# Patient Record
Sex: Female | Born: 1960
Health system: Southern US, Community
[De-identification: ages and names within clinical notes are randomized; demographics above are authoritative.]

## PROBLEM LIST (undated history)

## (undated) DIAGNOSIS — C801 Malignant (primary) neoplasm, unspecified: Secondary | ICD-10-CM

## (undated) DIAGNOSIS — D649 Anemia, unspecified: Secondary | ICD-10-CM

## (undated) DIAGNOSIS — F419 Anxiety disorder, unspecified: Secondary | ICD-10-CM

## (undated) DIAGNOSIS — E785 Hyperlipidemia, unspecified: Secondary | ICD-10-CM

## (undated) DIAGNOSIS — C44509 Unspecified malignant neoplasm of skin of other part of trunk: Secondary | ICD-10-CM

## (undated) DIAGNOSIS — R030 Elevated blood-pressure reading, without diagnosis of hypertension: Secondary | ICD-10-CM

## (undated) DIAGNOSIS — N950 Postmenopausal bleeding: Secondary | ICD-10-CM

## (undated) DIAGNOSIS — M1990 Unspecified osteoarthritis, unspecified site: Secondary | ICD-10-CM

## (undated) DIAGNOSIS — C541 Malignant neoplasm of endometrium: Secondary | ICD-10-CM

## (undated) DIAGNOSIS — R7303 Prediabetes: Secondary | ICD-10-CM

## (undated) DIAGNOSIS — I10 Essential (primary) hypertension: Secondary | ICD-10-CM

## (undated) DIAGNOSIS — T7840XA Allergy, unspecified, initial encounter: Secondary | ICD-10-CM

## (undated) DIAGNOSIS — R079 Chest pain, unspecified: Secondary | ICD-10-CM

## (undated) DIAGNOSIS — F32A Depression, unspecified: Secondary | ICD-10-CM

## (undated) HISTORY — DX: Allergy, unspecified, initial encounter: T78.40XA

## (undated) HISTORY — DX: Hyperlipidemia, unspecified: E78.5

## (undated) HISTORY — DX: Malignant (primary) neoplasm, unspecified: C80.1

## (undated) HISTORY — PX: ABDOMINAL HYSTERECTOMY: SHX81

---

## 1998-01-20 HISTORY — PX: NECK SURGERY: SHX720

## 2017-04-06 DIAGNOSIS — H8110 Benign paroxysmal vertigo, unspecified ear: Secondary | ICD-10-CM | POA: Insufficient documentation

## 2018-03-21 DIAGNOSIS — Z90710 Acquired absence of both cervix and uterus: Secondary | ICD-10-CM | POA: Insufficient documentation

## 2018-03-24 ENCOUNTER — Encounter (HOSPITAL_COMMUNITY): Payer: Self-pay | Admitting: Emergency Medicine

## 2018-03-24 ENCOUNTER — Emergency Department (HOSPITAL_COMMUNITY): Payer: Self-pay

## 2018-03-24 ENCOUNTER — Emergency Department (HOSPITAL_COMMUNITY)
Admission: EM | Admit: 2018-03-24 | Discharge: 2018-03-24 | Disposition: A | Discharge status: Home / Self Care | Payer: Self-pay | Attending: Emergency Medicine | Admitting: Emergency Medicine

## 2018-03-24 ENCOUNTER — Other Ambulatory Visit: Payer: Self-pay

## 2018-03-24 DIAGNOSIS — N8500 Endometrial hyperplasia, unspecified: Secondary | ICD-10-CM | POA: Insufficient documentation

## 2018-03-24 DIAGNOSIS — I10 Essential (primary) hypertension: Secondary | ICD-10-CM | POA: Insufficient documentation

## 2018-03-24 DIAGNOSIS — N950 Postmenopausal bleeding: Secondary | ICD-10-CM | POA: Insufficient documentation

## 2018-03-24 DIAGNOSIS — R195 Other fecal abnormalities: Secondary | ICD-10-CM | POA: Insufficient documentation

## 2018-03-24 DIAGNOSIS — R9389 Abnormal findings on diagnostic imaging of other specified body structures: Secondary | ICD-10-CM

## 2018-03-24 LAB — CBC WITH DIFFERENTIAL/PLATELET
Abs Immature Granulocytes: 0.01 10*3/uL (ref 0.00–0.07)
Basophils Absolute: 0.1 10*3/uL (ref 0.0–0.1)
Basophils Relative: 1 %
Eosinophils Absolute: 0.4 10*3/uL (ref 0.0–0.5)
Eosinophils Relative: 5 %
HCT: 45.1 % (ref 36.0–46.0)
HEMOGLOBIN: 14.9 g/dL (ref 12.0–15.0)
Immature Granulocytes: 0 %
LYMPHS ABS: 2.9 10*3/uL (ref 0.7–4.0)
Lymphocytes Relative: 39 %
MCH: 29.2 pg (ref 26.0–34.0)
MCHC: 33 g/dL (ref 30.0–36.0)
MCV: 88.4 fL (ref 80.0–100.0)
Monocytes Absolute: 0.6 10*3/uL (ref 0.1–1.0)
Monocytes Relative: 8 %
Neutro Abs: 3.6 10*3/uL (ref 1.7–7.7)
Neutrophils Relative %: 47 %
Platelets: 334 10*3/uL (ref 150–400)
RBC: 5.1 MIL/uL (ref 3.87–5.11)
RDW: 13.5 % (ref 11.5–15.5)
WBC: 7.5 10*3/uL (ref 4.0–10.5)
nRBC: 0 % (ref 0.0–0.2)

## 2018-03-24 LAB — COMPREHENSIVE METABOLIC PANEL
ALT: 38 U/L (ref 0–44)
AST: 29 U/L (ref 15–41)
Albumin: 4.1 g/dL (ref 3.5–5.0)
Alkaline Phosphatase: 104 U/L (ref 38–126)
Anion gap: 10 (ref 5–15)
BUN: 17 mg/dL (ref 6–20)
CALCIUM: 9.5 mg/dL (ref 8.9–10.3)
CO2: 22 mmol/L (ref 22–32)
Chloride: 108 mmol/L (ref 98–111)
Creatinine, Ser: 0.73 mg/dL (ref 0.44–1.00)
GFR calc Af Amer: >60 mL/min (ref >60)
GFR calc non Af Amer: >60 mL/min (ref >60)
Glucose, Bld: 125 mg/dL — ABNORMAL HIGH (ref 70–99)
Potassium: 4.5 mmol/L (ref 3.5–5.1)
Sodium: 140 mmol/L (ref 135–145)
Total Bilirubin: 0.4 mg/dL (ref 0.3–1.2)
Total Protein: 7.4 g/dL (ref 6.5–8.1)

## 2018-03-24 LAB — URINALYSIS, ROUTINE W REFLEX MICROSCOPIC
Bacteria, UA: NONE SEEN
Bilirubin Urine: NEGATIVE
Glucose, UA: NEGATIVE mg/dL
Ketones, ur: NEGATIVE mg/dL
Leukocytes,Ua: NEGATIVE
Nitrite: NEGATIVE
PH: 6 (ref 5.0–8.0)
Protein, ur: NEGATIVE mg/dL
SPECIFIC GRAVITY, URINE: 1.003 — AB (ref 1.005–1.030)

## 2018-03-24 LAB — LIPASE, BLOOD: Lipase: 36 U/L (ref 11–51)

## 2018-03-24 LAB — WET PREP, GENITAL
Clue Cells Wet Prep HPF POC: NONE SEEN
Sperm: NONE SEEN
Trich, Wet Prep: NONE SEEN
WBC, Wet Prep HPF POC: NONE SEEN
Yeast Wet Prep HPF POC: NONE SEEN

## 2018-03-24 LAB — POC OCCULT BLOOD, ED: Fecal Occult Bld: POSITIVE — AB

## 2018-03-24 NOTE — Discharge Instructions (Addendum)
Please follow-up with the OB/GYN as directed on your discharge paper.  You are also given a referral to the gastroenterology doctor and a primary care doctor to follow-up about the bleeding from your rectum and your elevated blood pressure.  Please return to the emergency department immediately for any persistent heavy bleeding, chest pain, shortness of breath, lightheadedness/dizziness, severe abdominal pain or any new or worsening symptoms.

## 2018-03-24 NOTE — Progress Notes (Signed)
RNCM has spoken with [place] with [facility] to schedule follow up appointment. RNCM was advised that appointment for pt is on March 29, 2018 at 9:15am. RNCM has updated PA of this appointment at this time as well as advised PA that if pt is unable to keep this appointment then pt would need to reschedule for self.   Jessica [name] J. Clydene Laming, [place], [place], [place]

## 2018-03-24 NOTE — ED Notes (Signed)
Patient verbalizes understanding of discharge instructions. Opportunity for questioning and answers were provided. Armband removed by staff, pt discharged from ED.  

## 2018-03-24 NOTE — ED Triage Notes (Signed)
Pt arrives to ED from home with complaints of rectal and vaginal bleeding for the last two to three months. Pt stated that she has to change her pad three times daily for the last three months. Pt stated she has no seen a MD due to not having insurance. Pt stated that she feels like she has pressure on her lower abdomen.

## 2018-03-24 NOTE — ED Provider Notes (Signed)
[facility] EMERGENCY DEPARTMENT Provider Note   CSN: [id number] Arrival date & time: 03/24/18  0818    History   Chief Complaint Chief Complaint  Patient presents with  . Vaginal Bleeding  . Rectal Bleeding    HPI Jessica [name] is a 58 y.o. female.     HPI   Patient is a 58 year old female who presents the emergency department today for evaluation of vaginal bleeding and rectal bleeding.  Patient states she has not had a menstrual cycle in about 6 to 7 years.  States she started having vaginal spotting about 6 months ago.  Over the last 2 weeks and she is having increasing rectal and vaginal bleeding.  She notes having to change her pad 3 times daily.  Reports bright red blood and intermittent dark clots.  She reports that she has a heaviness to her lower abdomen.  She also has an intermittent tearing pain to the right upper quadrant that comes and goes.  States it feels like a burning and warm sensation.  She does not have this symptom currently.  She reports nausea, chronic frequency/urgency with urination.  She reports mild diarrhea.  She denies vomiting, dysuria, fevers, night sweats, unintended weight loss, lightheadedness, shortness of breath, dyspnea on exertion or chest pain.  She states that she has been told that she has high blood pressure in the past but is not currently on medication for this.   History reviewed. No pertinent past medical history.  There are no active problems to display for this patient.  OB History   No obstetric history on file.      Home Medications    Prior to Admission medications   Not on File    Family History History reviewed. No pertinent family history.  Social History Social History   Tobacco Use  . Smoking status: Not on file  Substance Use Topics  . Alcohol use: Not on file  . Drug use: Not on file     Allergies   Patient has no allergy information on record.   Review of Systems Review of  Systems  Constitutional: Negative for fever and unexpected weight change.  HENT: Negative for ear pain and sore throat.   Eyes: Negative for visual disturbance.  Respiratory: Negative for cough and shortness of breath.   Cardiovascular: Negative for chest pain and leg swelling.  Gastrointestinal: Positive for abdominal pain (pressure) and diarrhea. Negative for constipation, nausea and vomiting.  Genitourinary: Positive for frequency (chronic), urgency (chronic) and vaginal bleeding. Negative for dysuria, hematuria, vaginal discharge and vaginal pain.  Musculoskeletal: Negative for back pain.  Skin: Negative for rash.  Neurological: Negative for dizziness, syncope, weakness and light-headedness.  All other systems reviewed and are negative.    Physical Exam Updated Vital Signs BP 136/71   Pulse (!) 51   Temp 98 F (36.7 C) (Oral)   Resp 18   Ht 5\' 5"  (1.651 m)   Wt 102.1 kg   SpO2 94%   BMI 37.44 kg/m   Physical Exam Vitals signs and nursing note reviewed.  Constitutional:      General: She is not in acute distress.    Appearance: She is well-developed. She is not ill-appearing.  HENT:     Head: Normocephalic and atraumatic.     Mouth/Throat:     Mouth: Mucous membranes are moist.  Eyes:     Conjunctiva/sclera: Conjunctivae normal.  Neck:     Musculoskeletal: Neck supple.  Cardiovascular:  Rate and Rhythm: Normal rate and regular rhythm.     Heart sounds: Normal heart sounds. No murmur.  Pulmonary:     Effort: Pulmonary effort is normal. No respiratory distress.     Breath sounds: Normal breath sounds. No stridor. No wheezing.  Abdominal:     General: Bowel sounds are normal. There is no distension.     Palpations: Abdomen is soft.     Tenderness: There is no abdominal tenderness. There is no guarding or rebound.  Genitourinary:    Comments: Exam performed by Rodney Booze,  exam chaperoned Date: 03/24/2018 Pelvic exam: normal external genitalia without  evidence of trauma. VULVA: normal appearing vulva with no masses, tenderness or lesion. VAGINA: no discharge noted, mild amount of blood in the vaginal vault CERVIX: abnormal appearing cervix. Multiple irregularly shaped lobes of tissue that are friable.  Wet prep and DNA probe for chlamydia and GC obtained.   ADNEXA: right adnexal ttp UTERUS: uterus is normal size, shape, consistency and nontender.  DRE: without evidence of gross blood or melena.  Stool is light brown.  No tenderness with exam. Skin:    General: Skin is warm and dry.     Capillary Refill: Capillary refill takes less than 2 seconds.  Neurological:     Mental Status: She is alert.  Psychiatric:     Comments: Anxious, tearful    ED Treatments / Results  Labs (all labs ordered are listed, but only abnormal results are displayed) Labs Reviewed  COMPREHENSIVE METABOLIC PANEL - Abnormal; Notable for the following components:      Result Value   Glucose, Bld 125 (*)    All other components within normal limits  URINALYSIS, ROUTINE W REFLEX MICROSCOPIC - Abnormal; Notable for the following components:   Color, Urine COLORLESS (*)    Specific Gravity, Urine 1.003 (*)    Hgb urine dipstick MODERATE (*)    All other components within normal limits  POC OCCULT BLOOD, ED - Abnormal; Notable for the following components:   Fecal Occult Bld POSITIVE (*)    All other components within normal limits  WET PREP, GENITAL  CBC WITH DIFFERENTIAL/PLATELET  LIPASE, BLOOD  GC/CHLAMYDIA PROBE AMP ([place]) NOT AT [facility]    EKG None  Radiology US Pelvic Complete With Transvaginal  Result Date: 03/24/2018 CLINICAL DATA:  58 year old postmenopausal female with vaginal bleeding for 2 months. EXAM: TRANSABDOMINAL AND TRANSVAGINAL ULTRASOUND OF PELVIS TECHNIQUE: Both transabdominal and transvaginal ultrasound examinations of the pelvis were performed. Transabdominal technique was performed for global imaging of the pelvis including  uterus, ovaries, adnexal regions, and pelvic cul-de-sac. It was necessary to proceed with endovaginal exam following the transabdominal exam to visualize the ovaries and endometrium. COMPARISON:  None FINDINGS: Uterus Measurements: 7.1 x 3.3 x 4.9 cm = volume: 60 mL. No fibroids or other mass visualized. Endometrium Thickness: Heterogeneous measuring 14 mm. No focal abnormality visualized. Right ovary Not visualized Left ovary Not visualized Other findings A trace amount of free pelvic fluid noted. IMPRESSION: 1. Thickened endometrium in this postmenopausal female. In the setting of post-menopausal bleeding, endometrial sampling is indicated to exclude carcinoma. If results are benign, sonohysterogram should be considered for focal lesion work-up. (Ref: Radiological Reasoning: Algorithmic Workup of Abnormal Vaginal Bleeding with Endovaginal Sonography and Sonohysterography. AJR 2008; 833:A25-05) 2. Ovaries not visualized. Electronically Signed   By: Margarette Canada M.D.   On: 03/24/2018 11:28    Procedures Procedures (including critical care time)  Medications Ordered in ED Medications -  No data to display   Initial Impression / Assessment and Plan / ED Course  I have reviewed the triage vital signs and the nursing notes.  Pertinent labs & imaging results that were available during my care of the patient were reviewed by me and considered in my medical decision making (see chart for details).     Final Clinical Impressions(s) / ED Diagnoses   Final diagnoses:  Postmenopausal bleeding  Thickened endometrium  Hypertension, unspecified type  Heme positive stool   Patient presents for 22-month history of postmenopausal vaginal bleeding and rectal bleeding.  No fevers.  No lightheadedness or dizziness.  No chest pain or shortness of breath.  She is not tachycardic.  Normal respirations and O2 sats.  Afebrile.  She is significantly hypertensive on initial evaluation, she is very upset, anxious and  crying.  Blood pressure improved significantly on reevaluation (299 systolic) when she was more calm, however she does need to follow-up with PCP about her blood pressure.  Referral given.  No evidence of hypertensive emergency at this time.  Her abdominal exam is grossly benign.  Point-of-care Hemoccult was obtained and there was light brown stool noted without obvious gross blood or melena.  Pelvic exam reveals abnormal appearing cervix. Multiple irregularly shaped lobes of tissue that are friable with mild amount of blood noted in the vaginal vault.  Patient's laboratory work is reassuring, her CBC does not show any signs of anemia.  Normal white blood cell count.  CMP, lipase are normal.  UA shows a moderate amount of blood but no evidence of urinary tract infection.  Wet prep is normal.  Her Hemoccult is positive.  It is unclear whether this is secondary to microscopic blood from the vagina however will give follow-up with GI.  An ultrasound was obtained which showed a thickened endometrium and it was recommended to obtain endometrial sampling.  I have discussed the patient's case with case management who has assisted in obtaining the patient an appointment with OB/GYN on 03/29/2018 due to concern for underlying malignancy.  She is aware of the plan for follow-up.  I have discussed strict return precautions for any new or worsening symptoms including heavy bleeding.  She and her daughter at bedside voiced understanding of the plan and reasons to return.  All questions answered.  Patient stable discharge.  ED Discharge Orders    None       Bishop Dublin 03/24/18 1226    Sherwood Gambler, MD 04/05/18 [phone]

## 2018-03-25 LAB — GC/CHLAMYDIA PROBE AMP (~~LOC~~) NOT AT ARMC
Chlamydia: NEGATIVE
Neisseria Gonorrhea: NEGATIVE

## 2018-03-29 ENCOUNTER — Ambulatory Visit: Payer: Self-pay | Admitting: Clinical

## 2018-03-29 ENCOUNTER — Encounter: Payer: Self-pay | Admitting: Family Medicine

## 2018-03-29 ENCOUNTER — Ambulatory Visit (INDEPENDENT_AMBULATORY_CARE_PROVIDER_SITE_OTHER): Payer: Self-pay | Admitting: Family Medicine

## 2018-03-29 ENCOUNTER — Other Ambulatory Visit (HOSPITAL_COMMUNITY)
Admission: RE | Admit: 2018-03-29 | Discharge: 2018-03-29 | Disposition: A | Discharge status: Home / Self Care | Payer: Self-pay | Source: Ambulatory Visit | Attending: Family Medicine | Admitting: Family Medicine

## 2018-03-29 DIAGNOSIS — Z124 Encounter for screening for malignant neoplasm of cervix: Secondary | ICD-10-CM

## 2018-03-29 DIAGNOSIS — F430 Acute stress reaction: Secondary | ICD-10-CM

## 2018-03-29 DIAGNOSIS — Z1151 Encounter for screening for human papillomavirus (HPV): Secondary | ICD-10-CM

## 2018-03-29 DIAGNOSIS — N888 Other specified noninflammatory disorders of cervix uteri: Secondary | ICD-10-CM | POA: Insufficient documentation

## 2018-03-29 NOTE — Assessment & Plan Note (Signed)
Suspect cancer as diagnosis, shared with patient. Will call for appointment with GYN/ONC once path is back. Will call patient with results.

## 2018-03-29 NOTE — Progress Notes (Signed)
   Subjective:    Patient ID: Jessica [name] is a 58 y.o. female presenting with Vaginal Bleeding  on 03/29/2018  HPI: Has not seen a provider for multiple years. No pap since birth of youngest child 25 years ago. Possibly with h/o abnormal paps. Began spotting x 6 months. Worse x 2 months. Seen in ED with cervical mass. Went through menopause 7 years ago. Reports feeling heaviness or pressure in her lower abdomen. Works at [employer]. Moved here from [place] a couple of years ago. Has no health insurance.  Review of Systems  Constitutional: Negative for chills and fever.  Respiratory: Negative for shortness of breath.   Cardiovascular: Negative for chest pain.  Gastrointestinal: Negative for abdominal pain, nausea and vomiting.  Genitourinary: Negative for dysuria.  Skin: Negative for rash.      Objective:    BP (!) 186/99   Pulse 73   Wt 227 lb 6.4 oz (103.1 kg)   BMI 37.84 kg/m  Physical Exam Constitutional:      General: She is not in acute distress.    Appearance: She is well-developed.  HENT:     Head: Normocephalic and atraumatic.  Eyes:     General: No scleral icterus. Neck:     Musculoskeletal: Neck supple.  Cardiovascular:     Rate and Rhythm: Normal rate.  Pulmonary:     Effort: Pulmonary effort is normal.  Abdominal:     Palpations: Abdomen is soft.  Genitourinary:    Comments: Entire cervix replaced with polypoid mass with multiple projections. Skin:    General: Skin is warm and dry.  Neurological:     Mental Status: She is alert and oriented to person, place, and time.   Procedure: Cervix visualized and mass noted.  Mass biopsied x 4.  Hemostasis obtained with Monsel's solution.       Assessment & Plan:   Problem List Items Addressed This Visit      Unprioritized   Cervical mass    Suspect cancer as diagnosis, shared with patient. Will call for appointment with GYN/ONC once path is back. Will call patient with results.      Relevant  Orders   Cytology - PAP   Surgical pathology( [facility]/ [facility])      Total face-to-face time with patient: 20 minutes. Over 50% of encounter was spent on counseling and coordination of care. Return if symptoms worsen or fail to improve.  Jessica [name] 03/29/2018 10:04 AM

## 2018-03-29 NOTE — BH Specialist Note (Signed)
Integrated Behavioral Health Initial Visit  MRN: [id number] Name: Jessica [name]  Number of [facility] Clinician visits:: 1/6 Session Start time: 10:49  Session End time: 11:06 Total time: 15 minutes  Type of Service: Magnet Interpretor:No. Interpretor Name and Language: n/a   Warm Hand Off Completed.       SUBJECTIVE: Jessica [name] is a 58 y.o. female accompanied by Adult daughter Patient was referred by Darron Doom, MD for emotional support after cervical biopsy today Patient reports the following symptoms/concerns: Pt states her primary concern today is worry for her children, after cervical mass found today; pt's children lost their father to cancer seven years ago.  Duration of problem: Today; Severity of problem: moderate  OBJECTIVE: Mood: Appropriate and Affect: Appropriate and Tearful Risk of harm to self or others: No plan to harm self or others  LIFE CONTEXT: Family and Social: Pt has 3 children, and 1 granddaughter; family and friends supportive School/Work: Pt works full-time Self-Care: - Life Changes: Cervical mass found today  GOALS ADDRESSED: Patient will: 1. Reduce symptoms of: stress 2. Increase knowledge and/or ability of: coping skills  3. Demonstrate ability to: Increase healthy adjustment to current life circumstances  INTERVENTIONS: Interventions utilized: Supportive Counseling  Standardized Assessments completed: GAD-7 and PHQ 9  ASSESSMENT: Patient currently experiencing Acute stress reaction.   Patient may benefit from supportive counseling today.  PLAN: 1. Follow up with behavioral health clinician on : As needed 2. Behavioral recommendations:  -Continue to allow friends and family to be supports; continue routine walks with granddaughter 3. Referral(s): [place] (In Clinic) 4. "From scale of 1-10, how likely are you to follow plan?": -  Garlan Fair, LCSW  Depression screen [facility] 2/9 03/29/2018  Decreased Interest 1  Down, Depressed, Hopeless 2  PHQ - 2 Score 3  Altered sleeping 3  Tired, decreased energy 1  Change in appetite 3  Feeling bad or failure about yourself  3  Trouble concentrating 3  Moving slowly or fidgety/restless 2  Suicidal thoughts 0  PHQ-9 Score 18   GAD 7 : Generalized Anxiety Score 03/29/2018  Nervous, Anxious, on Edge 2  Control/stop worrying 1  Worry too much - different things 2  Trouble relaxing 1  Restless 0  Easily annoyed or irritable 1  Afraid - awful might happen 1  Total GAD 7 Score 8

## 2018-03-29 NOTE — Patient Instructions (Signed)
Cervical Biopsy  A cervical biopsy is a procedure in which a sample of tissue from the cervix is removed and examined under a microscope to see if cancer cells are present. The cervix is the lowest part of the womb (uterus), which opens into the vagina (birth canal). You may also have this procedure:  · To check for growths that may become cancer.  · If the results of your Pap test were abnormal.  · If your health care provider saw an abnormality during a pelvic exam.  Tell a health care provider about:  · Any allergies you have.  · All medicines you are taking, including vitamins, herbs, eye drops, creams, and over-the-counter medicines.  · Any problems you or family members have had with anesthetic medicines.  · Any blood disorders you have.  · Any surgeries you have had.  · Any medical conditions you have.  · Whether you are pregnant or may be pregnant.  · Whether you are having your menstrual period or will be having your period at the time of the procedure.  What are the risks?  Generally, this is a safe procedure. However, problems may occur, including:  · Infection.  · Bleeding.  · Allergic reactions to medicines or dyes.  · Damage to other structures or organs.  What happens before the procedure?  Medicines  · You may be given an over-the-counter pain medicine to take right before the procedure.  · Ask your health care provider about:  ? Changing or stopping your regular medicines. This is especially important if you are taking diabetes medicines or blood thinners.  ? Taking medicines such as aspirin and ibuprofen. These medicines can thin your blood. Do not take these medicines unless your health care provider tells you to take them.  ? Taking over-the-counter medicines, vitamins, herbs, and supplements.  General instructions  · Do not douche, have sex, use tampons, or put any medicines in your vagina as told by your health care provider.  · Follow instructions from your health care provider about eating or  drinking restrictions.  · You may be asked to empty your bladder and bowel right before the procedure.  · Ask your health care provider what steps will be taken to help prevent infection. These may include:  ? Removing hair at the surgery site.  ? Washing skin with a germ-killing soap.  What happens during the procedure?  · You will undress from the waist down.  · You will lie on an examining table and put your feet in stirrups.  · Your health care provider will use a lubricated instrument (speculum) to open your vagina. An instrument that has a magnifying lens and a light (colposcope) will let your health care provider examine the cervix more closely.  · You may be given a medicine to numb the area (local anesthetic).  · Your health care provider will apply a solution to your cervix. This turns abnormal areas a pale color.  · Your health care provider will use an instrument (biopsy forceps) to take one or more small pieces of tissue that appear abnormal.  · If there seems to be an abnormal area in the part of your cervix that leads to the uterus (endocervical canal), your health care provider will use an instrument (curette) to scrape tissue from that area. This is called endocervical curettage.  · Your health care provider will apply a paste over the biopsy areas to help control bleeding.  The procedure may vary among   health care providers and hospitals.  What happens after the procedure?  · You may have slight bleeding and cramping after the procedure. You will need to wear a sanitary napkin for bleeding.  · It is up to you to get the results of your procedure. Ask your health care provider, or the department that is doing the procedure, when your results will be ready.  Summary  · A cervical biopsy is a procedure in which a sample of tissue from the cervix is removed and examined under a microscope to see if cancer cells are present.  · Generally, this is a safe procedure. However, infection, bleeding, reaction  to medicines, or damage to other organs may occur.  · Ask your health care provider whether you should stop your regular medicines.  · You may be given an over-the-counter pain medicine to take right before the procedure.  · Ask your health care provider, or the department that is doing the procedure, when your results will be ready.  This information is not intended to replace advice given to you by your health care provider. Make sure you discuss any questions you have with your health care provider.  Document Released: 01/06/2005 Document Revised: 06/12/2017 Document Reviewed: 06/12/2017  Elsevier Interactive Patient Education © 2019 [employer] Inc[employer]

## 2018-03-30 ENCOUNTER — Telehealth: Payer: Self-pay | Admitting: *Deleted

## 2018-03-30 ENCOUNTER — Telehealth: Payer: Self-pay | Admitting: Family Medicine

## 2018-03-30 NOTE — Telephone Encounter (Signed)
Called and left the patient a message to call the office back. Need to move her appt from tomorrow to Friday.

## 2018-03-30 NOTE — Telephone Encounter (Signed)
Patient called back and was given the appt information for tomorrow. (date, time, address, valet, pelvic exam)

## 2018-03-30 NOTE — Telephone Encounter (Signed)
Discussed pathology--cannot tell exact primary--mixed type of cells. She was having difficulty hearing as she was driving. Knows about appointment tomorrow. She will keep appointment.

## 2018-03-31 ENCOUNTER — Other Ambulatory Visit: Payer: Self-pay

## 2018-03-31 ENCOUNTER — Encounter: Payer: Self-pay | Admitting: Gynecologic Oncology

## 2018-03-31 ENCOUNTER — Inpatient Hospital Stay: Payer: Self-pay | Attending: Gynecologic Oncology | Admitting: Gynecologic Oncology

## 2018-03-31 ENCOUNTER — Other Ambulatory Visit: Payer: Self-pay | Admitting: Gynecologic Oncology

## 2018-03-31 VITALS — BP 161/74 | HR 74 | Temp 98.0°F | Resp 18 | Ht 65.0 in | Wt 228.0 lb

## 2018-03-31 DIAGNOSIS — C541 Malignant neoplasm of endometrium: Secondary | ICD-10-CM | POA: Insufficient documentation

## 2018-03-31 LAB — CYTOLOGY - PAP: HPV: NOT DETECTED

## 2018-03-31 MED ORDER — SENNOSIDES-DOCUSATE SODIUM 8.6-50 MG PO TABS: 2.0000 | ORAL_TABLET | Freq: Every day | ORAL | 1 refills | Status: DC

## 2018-03-31 MED ORDER — OXYCODONE HCL 5 MG PO TABS: 5.0000 mg | ORAL_TABLET | ORAL | 0 refills | Status: DC | PRN

## 2018-03-31 MED ORDER — IBUPROFEN 600 MG PO TABS: 600.0000 mg | ORAL_TABLET | Freq: Four times a day (QID) | ORAL | 1 refills | Status: DC | PRN

## 2018-03-31 NOTE — H&P (View-Only) (Signed)
Consult Note: Gyn-Onc  Consult was requested by Dr. Kennon Rounds for the evaluation of Jessica [name] 58 y.o. female  CC:  Chief Complaint  Patient presents with  . endometrial cancer    Assessment/Plan:  [name]  is a 58 y.o.  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   On examination it appears that the tumor is prolapsing through the cervix however I can palpate a thin rim of grossly normal cervix surrounding this mass, therefore there is a good possibility that this is not a stage II tumor.  However the displacement of the cervix with the mass precludes the ability to perform sentinel lymph node biopsy.  We will obtain preoperative CT scans to evaluate for obvious metastatic disease that would change plans for proceeding with surgery.  If these remain reassuring we will proceed with a robotically assisted total hysterectomy BSO and pelvic possibly para-aortic lymphadenectomy.  A detailed discussion was held with the patient and her family with regard to to her endometrial cancer diagnosis. The patient has been counseled about these surgical options and the risks of surgery in general including infection, bleeding, damage to surrounding structures (including bowel, bladder, ureters, nerves or vessels), and the postoperative risks of PE/ DVT, and lymphedema. I extensively reviewed the additional risks of robotic hysterectomy including possible need for conversion to open laparotomy.  I discussed positioning during surgery of trendelenberg and risks of minor facial swelling and care we take in preoperative positioning.   She will be seen for a discussion of postoperative pain management.  She was given the opportunity to ask questions, which were answered to her satisfaction, and she is agreement with the above mentioned plan of care.  She was informed that due to the high grade nature of her pathology, she will likely require postoperative adjuvant therapy with chemotherapy and possibly  radiation.   HPI: [name] is a 58 year old P3 who is seen in consultation at the request of Dr Kennon Rounds for uterine carcinosarcoma.  The patient reports 52-month history of vaginal spotting which became more consistent with menstrual-like bleeding in January 2020.  She then presented to the emergency room at [facility] where a physical examination was performed which revealed a cervical mass, on March 24, 2018.  She then had a transvaginal ultrasound scan on March 24, 2018 which revealed a uterus measuring 7.1 x 3.3 x 4.9 cm with no gross masses within the uterus identified, the endometrium was thickened measuring 14 mm but with no focal abnormality, the right and left ovaries were not visualized, there was a trace amount of free pelvic fluid.  Due to the finding of a thickened endometrium and the cervical mass on examination she was sent for evaluation by Dr. Kennon Rounds.  On examination on March 29, 2026 Dr. Kennon Rounds identified that the entire cervix was replaced with a polypoid mass with multiple projections.  She took biopsies from this mass which revealed carcinosarcoma.  The patient does not seek medical care frequently and does not carry medical diagnoses.  She is obese with a BMI of 38 kg/m.  Her father died from lung cancer but was a smoker.  She also has a maternal aunt who died from metastatic carcinoma but she is unknown of what primary diagnosis she had.  The patient has had no prior abdominal surgeries only prior neck surgery.  She had 3 pregnancies which were all vaginal deliveries.  Recent Hemoccult testing of her stool was positive and she has  a strong urology consult scheduled for April 10, 2018.  She is never had a colonoscopy.  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   No facility-administered encounter medications on file as of 03/31/2018.     Allergy: Not on File  Social  Hx:   Social History   Socioeconomic History  . Marital status: Widowed    Spouse name: Not on file  . Number of children: Not on file  . Years of education: Not on file  . Highest education level: Not on file  Occupational History  . Not on file  Social Needs  . Financial resource strain: Not on file  . Food insecurity:    Worry: Never true    Inability: Never true  . Transportation needs:    Medical: No    Non-medical: No  Tobacco Use  . Smoking status: Never Smoker  . Smokeless tobacco: Never Used  Substance and Sexual Activity  . Alcohol use: Yes    Alcohol/week: 4.0 standard drinks    Types: 4 Glasses of wine per week  . Drug use: Never  . Sexual activity: Not Currently  Lifestyle  . Physical activity:    Days per week: Not on file    Minutes per session: Not on file  . Stress: Not on file  Relationships  . Social connections:    Talks on phone: Not on file    Gets together: Not on file    Attends religious service: Not on file    Active member of club or organization: Not on file    Attends meetings of clubs or organizations: Not on file    Relationship status: Not on file  . Intimate partner violence:    Fear of current or ex partner: Not on file    Emotionally abused: Not on file    Physically abused: Not on file    Forced sexual activity: Not on file  Other Topics Concern  . Not on file  Social History Narrative  . Not on file    Past Surgical Hx:  Past Surgical History:  Procedure Laterality Date  . NECK SURGERY  2000    Past Medical Hx: History reviewed. No pertinent past medical history.  Past Gynecological History:  P3 (svd's) No LMP recorded. Patient is postmenopausal.  Family Hx:  Family History  Problem Relation Age of Onset  . Hypertension Father   . Heart disease Father   . Cancer Father        lung  . Hypertension Mother     Review of Systems:  Constitutional  Feels well,   ENT Normal appearing ears and nares  bilaterally Skin/Breast  No rash, sores, jaundice, itching, dryness Cardiovascular  No chest pain, shortness of breath, or edema  Pulmonary  No cough or wheeze.  Gastro Intestinal  No nausea, vomitting, or diarrhoea. No bright red blood per rectum, no abdominal pain, change in bowel movement, or constipation.  Genito Urinary  No frequency, urgency, dysuria, + postmenopausal bleeding Musculo Skeletal  No myalgia, arthralgia, joint swelling or pain  Neurologic  No weakness, numbness, change in gait,  Psychology  No depression, anxiety, insomnia.   Vitals:  Blood pressure (!) 161/74, pulse 74, temperature 98 F (36.7 C), temperature source Oral, resp. rate 18, height 5\' 5"  (1.651 m), weight 228 lb (103.4 kg), SpO2 99 %.  Physical Exam: WD in NAD Neck  Supple  NROM, without any enlargements.  Lymph Node Survey No cervical supraclavicular or inguinal adenopathy Cardiovascular  Pulse normal rate, regularity and rhythm. S1 and S2 normal.  Lungs  Clear to auscultation bilateraly, without wheezes/crackles/rhonchi. Good air movement.  Skin  No rash/lesions/breakdown  Psychiatry  Alert and oriented to person, place, and time  Abdomen  Normoactive bowel sounds, abdomen soft, non-tender and obese without evidence of hernia.  Back No CVA tenderness Genito Urinary  Vulva/vagina: Normal external female genitalia.  No lesions. No discharge or bleeding.  Bladder/urethra:  No lesions or masses, well supported bladder  Vagina: filled with tumor from uterus, but the vaginal walls are grossly normal and palpably normal  Cervix: dilated around a central prolapsing mass through the cervical os. Palpably normal rim of cervix appreciated circumferentially around the prolapsed mass  Uterus: Small, mobile, no parametrial involvement or nodularity.  Adnexa: no discretely palpated masses. Rectal  deferred Extremities  No bilateral cyanosis, clubbing or edema.   Thereasa Solo, MD  03/31/2018, 9:21  AM

## 2018-03-31 NOTE — Progress Notes (Signed)
Consult Note: Gyn-Onc  Consult was requested by Dr. Kennon Rounds for the evaluation of Jessica [name] 58 y.o. female  CC:  Chief Complaint  Patient presents with  . endometrial cancer    Assessment/Plan:  [name]  is a 58 y.o.  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   On examination it appears that the tumor is prolapsing through the cervix however I can palpate a thin rim of grossly normal cervix surrounding this mass, therefore there is a good possibility that this is not a stage II tumor.  However the displacement of the cervix with the mass precludes the ability to perform sentinel lymph node biopsy.  We will obtain preoperative CT scans to evaluate for obvious metastatic disease that would change plans for proceeding with surgery.  If these remain reassuring we will proceed with a robotically assisted total hysterectomy BSO and pelvic possibly para-aortic lymphadenectomy.  A detailed discussion was held with the patient and her family with regard to to her endometrial cancer diagnosis. The patient has been counseled about these surgical options and the risks of surgery in general including infection, bleeding, damage to surrounding structures (including bowel, bladder, ureters, nerves or vessels), and the postoperative risks of PE/ DVT, and lymphedema. I extensively reviewed the additional risks of robotic hysterectomy including possible need for conversion to open laparotomy.  I discussed positioning during surgery of trendelenberg and risks of minor facial swelling and care we take in preoperative positioning.   She will be seen for a discussion of postoperative pain management.  She was given the opportunity to ask questions, which were answered to her satisfaction, and she is agreement with the above mentioned plan of care.  She was informed that due to the high grade nature of her pathology, she will likely require postoperative adjuvant therapy with chemotherapy and possibly  radiation.   HPI: [name] is a 58 year old P3 who is seen in consultation at the request of Dr Kennon Rounds for uterine carcinosarcoma.  The patient reports 86-month history of vaginal spotting which became more consistent with menstrual-like bleeding in January 2020.  She then presented to the emergency room at [facility] where a physical examination was performed which revealed a cervical mass, on March 24, 2018.  She then had a transvaginal ultrasound scan on March 24, 2018 which revealed a uterus measuring 7.1 x 3.3 x 4.9 cm with no gross masses within the uterus identified, the endometrium was thickened measuring 14 mm but with no focal abnormality, the right and left ovaries were not visualized, there was a trace amount of free pelvic fluid.  Due to the finding of a thickened endometrium and the cervical mass on examination she was sent for evaluation by Dr. Kennon Rounds.  On examination on March 29, 2026 Dr. Kennon Rounds identified that the entire cervix was replaced with a polypoid mass with multiple projections.  She took biopsies from this mass which revealed carcinosarcoma.  The patient does not seek medical care frequently and does not carry medical diagnoses.  She is obese with a BMI of 38 kg/m.  Her father died from lung cancer but was a smoker.  She also has a maternal aunt who died from metastatic carcinoma but she is unknown of what primary diagnosis she had.  The patient has had no prior abdominal surgeries only prior neck surgery.  She had 3 pregnancies which were all vaginal deliveries.  Recent Hemoccult testing of her stool was positive and she has  a strong urology consult scheduled for April 10, 2018.  She is never had a colonoscopy.  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   No facility-administered encounter medications on file as of 03/31/2018.     Allergy: Not on File  Social  Hx:   Social History   Socioeconomic History  . Marital status: Widowed    Spouse name: Not on file  . Number of children: Not on file  . Years of education: Not on file  . Highest education level: Not on file  Occupational History  . Not on file  Social Needs  . Financial resource strain: Not on file  . Food insecurity:    Worry: Never true    Inability: Never true  . Transportation needs:    Medical: No    Non-medical: No  Tobacco Use  . Smoking status: Never Smoker  . Smokeless tobacco: Never Used  Substance and Sexual Activity  . Alcohol use: Yes    Alcohol/week: 4.0 standard drinks    Types: 4 Glasses of wine per week  . Drug use: Never  . Sexual activity: Not Currently  Lifestyle  . Physical activity:    Days per week: Not on file    Minutes per session: Not on file  . Stress: Not on file  Relationships  . Social connections:    Talks on phone: Not on file    Gets together: Not on file    Attends religious service: Not on file    Active member of club or organization: Not on file    Attends meetings of clubs or organizations: Not on file    Relationship status: Not on file  . Intimate partner violence:    Fear of current or ex partner: Not on file    Emotionally abused: Not on file    Physically abused: Not on file    Forced sexual activity: Not on file  Other Topics Concern  . Not on file  Social History Narrative  . Not on file    Past Surgical Hx:  Past Surgical History:  Procedure Laterality Date  . NECK SURGERY  2000    Past Medical Hx: History reviewed. No pertinent past medical history.  Past Gynecological History:  P3 (svd's) No LMP recorded. Patient is postmenopausal.  Family Hx:  Family History  Problem Relation Age of Onset  . Hypertension Father   . Heart disease Father   . Cancer Father        lung  . Hypertension Mother     Review of Systems:  Constitutional  Feels well,   ENT Normal appearing ears and nares  bilaterally Skin/Breast  No rash, sores, jaundice, itching, dryness Cardiovascular  No chest pain, shortness of breath, or edema  Pulmonary  No cough or wheeze.  Gastro Intestinal  No nausea, vomitting, or diarrhoea. No bright red blood per rectum, no abdominal pain, change in bowel movement, or constipation.  Genito Urinary  No frequency, urgency, dysuria, + postmenopausal bleeding Musculo Skeletal  No myalgia, arthralgia, joint swelling or pain  Neurologic  No weakness, numbness, change in gait,  Psychology  No depression, anxiety, insomnia.   Vitals:  Blood pressure (!) 161/74, pulse 74, temperature 98 F (36.7 C), temperature source Oral, resp. rate 18, height 5\' 5"  (1.651 m), weight 228 lb (103.4 kg), SpO2 99 %.  Physical Exam: WD in NAD Neck  Supple  NROM, without any enlargements.  Lymph Node Survey No cervical supraclavicular or inguinal adenopathy Cardiovascular  Pulse normal rate, regularity and rhythm. S1 and S2 normal.  Lungs  Clear to auscultation bilateraly, without wheezes/crackles/rhonchi. Good air movement.  Skin  No rash/lesions/breakdown  Psychiatry  Alert and oriented to person, place, and time  Abdomen  Normoactive bowel sounds, abdomen soft, non-tender and obese without evidence of hernia.  Back No CVA tenderness Genito Urinary  Vulva/vagina: Normal external female genitalia.  No lesions. No discharge or bleeding.  Bladder/urethra:  No lesions or masses, well supported bladder  Vagina: filled with tumor from uterus, but the vaginal walls are grossly normal and palpably normal  Cervix: dilated around a central prolapsing mass through the cervical os. Palpably normal rim of cervix appreciated circumferentially around the prolapsed mass  Uterus: Small, mobile, no parametrial involvement or nodularity.  Adnexa: no discretely palpated masses. Rectal  deferred Extremities  No bilateral cyanosis, clubbing or edema.   Thereasa Solo, MD  03/31/2018, 9:21  AM

## 2018-03-31 NOTE — Patient Instructions (Addendum)
Plan to have a CT scan of the chest, abdomen, and pelvis and we will contact you with the results.  Preparing for your Surgery  Plan for surgery on April 13, 2018 with Dr. Everitt Amber at [facility] will be scheduled for a robotic assisted total hysterectomy, bilateral salpingo-oophorectomy, lymphadenectomy.   Pre-operative Testing -You will receive a phone call from presurgical testing at [facility] if you have not received a call already to arrange for a pre-operative testing appointment before your surgery.  This appointment normally occurs one to two weeks before your scheduled surgery.   -Bring your insurance card, copy of an advanced directive if applicable, medication list  -At that visit, you will be asked to sign a consent for a possible blood transfusion in case a transfusion becomes necessary during surgery.  The need for a blood transfusion is rare but having consent is a necessary part of your care.     -You should not be taking blood thinners or aspirin at least ten days prior to surgery unless instructed by your surgeon.  -As part of our enhanced surgical recovery pathway, you may be advised to drink a carbohydrate drink the morning of surgery (at least 3 hours before). If you are diabetic, this will be avoided in order to prevent elevated glucose levels.  Day Before Surgery at [place] will be asked to take in a light diet the day before surgery.  Avoid carbonated beverages.  You will be advised to have nothing to eat or drink after midnight the evening before.    Eat a light diet the day before surgery.  Examples including soups, broths, toast, yogurt, mashed potatoes.  Things to avoid include carbonated beverages (fizzy beverages), raw fruits and raw vegetables, or beans.   If your bowels are filled with gas, your surgeon will have difficulty visualizing your pelvic organs which increases your surgical risks.  Your role in recovery Your role is to  become active as soon as directed by your doctor, while still giving yourself time to heal.  Rest when you feel tired. You will be asked to do the following in order to speed your recovery:  - Cough and breathe deeply. This helps toclear and expand your lungs and can prevent pneumonia. You may be given a spirometer to practice deep breathing. A staff member will show you how to use the spirometer. - Do mild physical activity. Walking or moving your legs help your circulation and body functions return to normal. A staff member will help you when you try to walk and will provide you with simple exercises. Do not try to get up or walk alone the first time. - Actively manage your pain. Managing your pain lets you move in comfort. We will ask you to rate your pain on a scale of zero to 10. It is your responsibility to tell your doctor or nurse where and how much you hurt so your pain can be treated.  Special Considerations -If you are diabetic, you may be placed on insulin after surgery to have closer control over your blood sugars to promote healing and recovery.  This does not mean that you will be discharged on insulin.  If applicable, your oral antidiabetics will be resumed when you are tolerating a solid diet.  -Your final pathology results from surgery should be available around one week after surgery and the results will be relayed to you when available.  -Dr. Lahoma Crocker is the Surgeon that  assists your GYN Oncologist with surgery.  If you end up staying the night, the next day after your surgery you will either see Dr. Denman George or Dr. Lahoma Crocker.  -FMLA forms can be faxed to [phone] and please allow 5-7 business days for completion.  Pain Management After Surgery -You have been prescribed your pain medication and bowel regimen medications before surgery so that you can have these available when you are discharged from the hospital. The pain medication is for use ONLY AFTER  surgery and a new prescription will not be given.   -Make sure that you have Tylenol and Ibuprofen at home to use on a regular basis after surgery for pain control. We recommend alternating the medications every hour to six hours since they work differently and are processed in the body differently for pain relief.  -Review the attached handout on narcotic use and their risks and side effects.   Bowel Regimen -You have been prescribed Sennakot-S to take nightly to prevent constipation especially if you are taking the narcotic pain medication intermittently.  It is important to prevent constipation and drink adequate amounts of liquids.  Blood Transfusion Information WHAT IS A BLOOD TRANSFUSION? A transfusion is the replacement of blood or some of its parts. Blood is made up of multiple cells which provide different functions.  Red blood cells carry oxygen and are used for blood loss replacement.  White blood cells fight against infection.  Platelets control bleeding.  Plasma helps clot blood.  Other blood products are available for specialized needs, such as hemophilia or other clotting disorders. BEFORE THE TRANSFUSION  Who gives blood for transfusions?   You may be able to donate blood to be used at a later date on yourself (autologous donation).  Relatives can be asked to donate blood. This is generally not any safer than if you have received blood from a stranger. The same precautions are taken to ensure safety when a relative's blood is donated.  Healthy volunteers who are fully evaluated to make sure their blood is safe. This is blood bank blood. Transfusion therapy is the safest it has ever been in the practice of medicine. Before blood is taken from a donor, a complete history is taken to make sure that person has no history of diseases nor engages in risky social behavior (examples are intravenous drug use or sexual activity with multiple partners). The donor's travel history  is screened to minimize risk of transmitting infections, such as malaria. The donated blood is tested for signs of infectious diseases, such as HIV and hepatitis. The blood is then tested to be sure it is compatible with you in order to minimize the chance of a transfusion reaction. If you or a relative donates blood, this is often done in anticipation of surgery and is not appropriate for emergency situations. It takes many days to process the donated blood. RISKS AND COMPLICATIONS Although transfusion therapy is very safe and saves many lives, the main dangers of transfusion include:   Getting an infectious disease.  Developing a transfusion reaction. This is an allergic reaction to something in the blood you were given. Every precaution is taken to prevent this. The decision to have a blood transfusion has been considered carefully by your caregiver before blood is given. Blood is not given unless the benefits outweigh the risks.

## 2018-04-02 ENCOUNTER — Other Ambulatory Visit: Payer: Self-pay

## 2018-04-02 ENCOUNTER — Ambulatory Visit (HOSPITAL_COMMUNITY)
Admission: RE | Admit: 2018-04-02 | Discharge: 2018-04-02 | Disposition: A | Discharge status: Home / Self Care | Payer: Self-pay | Source: Ambulatory Visit | Attending: Gynecologic Oncology | Admitting: Gynecologic Oncology

## 2018-04-02 DIAGNOSIS — C541 Malignant neoplasm of endometrium: Secondary | ICD-10-CM | POA: Insufficient documentation

## 2018-04-02 MED ORDER — IOHEXOL 300 MG/ML  SOLN
100.0000 mL | Freq: Once | INTRAMUSCULAR | Status: AC | PRN
  Administered 2018-04-02: 100 mL via INTRAVENOUS

## 2018-04-02 MED ORDER — SODIUM CHLORIDE (PF) 0.9 % IJ SOLN
INTRAMUSCULAR | Status: AC
  Filled 2018-04-02: qty 50

## 2018-04-02 NOTE — Patient Instructions (Signed)
Jessica [name]  04/02/2018   Your procedure is scheduled on: 04-13-2018   Report to [facility]  Entrance    Report to admitting at 8:00AM    Call this number if you have problems the morning of surgery [phone]      Remember: NO SOLID FOOD AFTER MIDNIGHT THE NIGHT PRIOR TO SURGERY. YOU MAY DRINK CLEAR LIQUIDS.   STOP CLEAR LIQUIDS AT ___7:00AM______ AND THEN DRINK THE ENSURE PRE-SURGERY [place]. NOTHING BY MOUTH AFTER THE ENSURE DRINK! BRUSH YOUR TEETH MORNING OF SURGERY AND RINSE YOUR MOUTH OUT, NO CHEWING GUM CANDY OR MINTS.      CLEAR LIQUID DIET   Foods Allowed                                                                     Foods Excluded  Coffee and tea, regular and decaf                             liquids that you cannot  Plain Jell-O in any flavor                                             see through such as: Fruit ices (not with fruit pulp)                                     milk, soups, orange juice  Iced Popsicles                                    All solid food Carbonated beverages, regular and diet                                    Cranberry, grape and apple juices Sports drinks like Gatorade Lightly seasoned clear broth or consume(fat free) Sugar, honey syrup  Sample Menu Breakfast                                Lunch                                     Supper Cranberry juice                    Beef broth                            Chicken broth Jell-O  Grape juice                           Apple juice Coffee or tea                        Jell-O                                      Popsicle                                                Coffee or tea                        Coffee or tea  _____________________________________________________________________       Take these medicines the morning of surgery with A SIP OF WATER: none                                You may not have  any metal on your body including hair pins and              piercings  Do not wear jewelry, make-up, lotions, powders or perfumes, deodorant             Do not wear nail polish.  Do not shave  48 hours prior to surgery.     Do not bring valuables to the hospital. [place].  Contacts, dentures or bridgework may not be worn into surgery.       Patients discharged the day of surgery will not be allowed to drive home. IF YOU ARE HAVING SURGERY AND GOING HOME THE SAME DAY, YOU MUST HAVE AN ADULT TO DRIVE YOU HOME AND BE WITH YOU FOR 24 HOURS. YOU MAY GO HOME BY TAXI OR UBER OR ORTHERWISE, BUT AN ADULT MUST ACCOMPANY YOU HOME AND STAY WITH YOU FOR 24 HOURS.  Name and phone number of your driver:  Special Instructions: N/A              Please read over the following fact sheets you were given: _____________________________________________________________________             [facility] - Preparing for Surgery Before surgery, you can play an important role.  Because skin is not sterile, your skin needs to be as free of germs as possible.  You can reduce the number of germs on your skin by washing with CHG (chlorahexidine gluconate) soap before surgery.  CHG is an antiseptic cleaner which kills germs and bonds with the skin to continue killing germs even after washing. Please DO NOT use if you have an allergy to CHG or antibacterial soaps.  If your skin becomes reddened/irritated stop using the CHG and inform your nurse when you arrive at Short Stay. Do not shave (including legs and underarms) for at least 48 hours prior to the first CHG shower.  You may shave your face/neck. Please follow these instructions carefully:  1.  Shower with CHG Soap the night before surgery and the  morning  of Surgery.  2.  If you choose to wash your hair, wash your hair first as usual with your  normal  shampoo.  3.  After you shampoo, rinse your hair and body  thoroughly to remove the  shampoo.                           4.  Use CHG as you would any other liquid soap.  You can apply chg directly  to the skin and wash                       Gently with a scrungie or clean washcloth.  5.  Apply the CHG Soap to your body ONLY FROM THE NECK DOWN.   Do not use on face/ open                           Wound or open sores. Avoid contact with eyes, ears mouth and genitals (private parts).                       Wash face,  Genitals (private parts) with your normal soap.             6.  Wash thoroughly, paying special attention to the area where your surgery  will be performed.  7.  Thoroughly rinse your body with warm water from the neck down.  8.  DO NOT shower/wash with your normal soap after using and rinsing off  the CHG Soap.                9.  Pat yourself dry with a clean towel.            10.  Wear clean pajamas.            11.  Place clean sheets on your bed the night of your first shower and do not  sleep with pets. Day of Surgery : Do not apply any lotions/deodorants the morning of surgery.  Please wear clean clothes to the hospital/surgery center.  FAILURE TO FOLLOW THESE INSTRUCTIONS MAY RESULT IN THE CANCELLATION OF YOUR SURGERY PATIENT SIGNATURE_________________________________  NURSE SIGNATURE__________________________________  ________________________________________________________________________   Jessica [name]  An incentive spirometer is a tool that can help keep your lungs clear and active. This tool measures how well you are filling your lungs with each breath. Taking long deep breaths may help reverse or decrease the chance of developing breathing (pulmonary) problems (especially infection) following:  A long period of time when you are unable to move or be active. BEFORE THE PROCEDURE   If the spirometer includes an indicator to show your best effort, your nurse or respiratory therapist will set it to a desired goal.  If  possible, sit up straight or lean slightly forward. Try not to slouch.  Hold the incentive spirometer in an upright position. INSTRUCTIONS FOR USE  1. Sit on the edge of your bed if possible, or sit up as far as you can in bed or on a chair. 2. Hold the incentive spirometer in an upright position. 3. Breathe out normally. 4. Place the mouthpiece in your mouth and seal your lips tightly around it. 5. Breathe in slowly and as deeply as possible, raising the piston or the ball toward the top of the column. 6. Hold your breath for 3-5 seconds or for as long as possible.  Allow the piston or ball to fall to the bottom of the column. 7. Remove the mouthpiece from your mouth and breathe out normally. 8. Rest for a few seconds and repeat Steps 1 through 7 at least 10 times every 1-2 hours when you are awake. Take your time and take a few normal breaths between deep breaths. 9. The spirometer may include an indicator to show your best effort. Use the indicator as a goal to work toward during each repetition. 10. After each set of 10 deep breaths, practice coughing to be sure your lungs are clear. If you have an incision (the cut made at the time of surgery), support your incision when coughing by placing a pillow or rolled up towels firmly against it. Once you are able to get out of bed, walk around indoors and cough well. You may stop using the incentive spirometer when instructed by your caregiver.  RISKS AND COMPLICATIONS  Take your time so you do not get dizzy or light-headed.  If you are in pain, you may need to take or ask for pain medication before doing incentive spirometry. It is harder to take a deep breath if you are having pain. AFTER USE  Rest and breathe slowly and easily.  It can be helpful to keep track of a log of your progress. Your caregiver can provide you with a simple table to help with this. If you are using the spirometer at home, follow these instructions: [place]  IF:   You are having difficultly using the spirometer.  You have trouble using the spirometer as often as instructed.  Your pain medication is not giving enough relief while using the spirometer.  You develop fever of 100.5 F (38.1 C) or higher. SEEK IMMEDIATE MEDICAL CARE IF:   You cough up bloody sputum that had not been present before.  You develop fever of 102 F (38.9 C) or greater.  You develop worsening pain at or near the incision site. MAKE SURE YOU:   Understand these instructions.  Will watch your condition.  Will get help right away if you are not doing well or get worse. Document Released: 05/19/2006 Document Revised: 03/31/2011 Document Reviewed: 07/20/2006 Exit[employer] Patient Information 2014 Exit[employer], [place].   ________________________________________________________________________  WHAT IS A BLOOD TRANSFUSION? Blood Transfusion Information  A transfusion is the replacement of blood or some of its parts. Blood is made up of multiple cells which provide different functions.  Red blood cells carry oxygen and are used for blood loss replacement.  White blood cells fight against infection.  Platelets control bleeding.  Plasma helps clot blood.  Other blood products are available for specialized needs, such as hemophilia or other clotting disorders. BEFORE THE TRANSFUSION  Who gives blood for transfusions?   Healthy volunteers who are fully evaluated to make sure their blood is safe. This is blood bank blood. Transfusion therapy is the safest it has ever been in the practice of medicine. Before blood is taken from a donor, a complete history is taken to make sure that person has no history of diseases nor engages in risky social behavior (examples are intravenous drug use or sexual activity with multiple partners). The donor's travel history is screened to minimize risk of transmitting infections, such as malaria. The donated blood is tested for signs of  infectious diseases, such as HIV and hepatitis. The blood is then tested to be sure it is compatible with you in order to minimize the chance of a transfusion  reaction. If you or a relative donates blood, this is often done in anticipation of surgery and is not appropriate for emergency situations. It takes many days to process the donated blood. RISKS AND COMPLICATIONS Although transfusion therapy is very safe and saves many lives, the main dangers of transfusion include:   Getting an infectious disease.  Developing a transfusion reaction. This is an allergic reaction to something in the blood you were given. Every precaution is taken to prevent this. The decision to have a blood transfusion has been considered carefully by your caregiver before blood is given. Blood is not given unless the benefits outweigh the risks. AFTER THE TRANSFUSION  Right after receiving a blood transfusion, you will usually feel much better and more energetic. This is especially true if your red blood cells have gotten low (anemic). The transfusion raises the level of the red blood cells which carry oxygen, and this usually causes an energy increase.  The nurse administering the transfusion will monitor you carefully for complications. HOME CARE INSTRUCTIONS  No special instructions are needed after a transfusion. You may find your energy is better. Speak with your caregiver about any limitations on activity for underlying diseases you may have. SEEK MEDICAL CARE IF:   Your condition is not improving after your transfusion.  You develop redness or irritation at the intravenous (IV) site. SEEK IMMEDIATE MEDICAL CARE IF:  Any of the following symptoms occur over the next 12 hours:  Shaking chills.  You have a temperature by mouth above 102 F (38.9 C), not controlled by medicine.  Chest, back, or muscle pain.  People around you feel you are not acting correctly or are confused.  Shortness of breath or  difficulty breathing.  Dizziness and fainting.  You get a rash or develop hives.  You have a decrease in urine output.  Your urine turns a dark color or changes to pink, red, or brown. Any of the following symptoms occur over the next 10 days:  You have a temperature by mouth above 102 F (38.9 C), not controlled by medicine.  Shortness of breath.  Weakness after normal activity.  The white part of the eye turns yellow (jaundice).  You have a decrease in the amount of urine or are urinating less often.  Your urine turns a dark color or changes to pink, red, or brown. Document Released: 01/04/2000 Document Revised: 03/31/2011 Document Reviewed: 08/23/2007 [facility] Patient Information 2014 [place], [place].  _______________________________________________________________________

## 2018-04-05 ENCOUNTER — Encounter (HOSPITAL_COMMUNITY)
Admission: RE | Admit: 2018-04-05 | Discharge: 2018-04-05 | Disposition: A | Discharge status: Home / Self Care | Payer: Self-pay | Source: Ambulatory Visit | Attending: Gynecologic Oncology | Admitting: Gynecologic Oncology

## 2018-04-05 ENCOUNTER — Telehealth: Payer: Self-pay | Admitting: Gynecologic Oncology

## 2018-04-05 ENCOUNTER — Encounter (HOSPITAL_COMMUNITY): Payer: Self-pay

## 2018-04-05 ENCOUNTER — Other Ambulatory Visit: Payer: Self-pay

## 2018-04-05 DIAGNOSIS — C541 Malignant neoplasm of endometrium: Secondary | ICD-10-CM | POA: Insufficient documentation

## 2018-04-05 DIAGNOSIS — Z01812 Encounter for preprocedural laboratory examination: Secondary | ICD-10-CM | POA: Insufficient documentation

## 2018-04-05 HISTORY — DX: Postmenopausal bleeding: N95.0

## 2018-04-05 HISTORY — DX: Elevated blood-pressure reading, without diagnosis of hypertension: R03.0

## 2018-04-05 HISTORY — DX: Malignant neoplasm of endometrium: C54.1

## 2018-04-05 LAB — CBC
HEMATOCRIT: 43.1 % (ref 36.0–46.0)
Hemoglobin: 13.6 g/dL (ref 12.0–15.0)
MCH: 28.8 pg (ref 26.0–34.0)
MCHC: 31.6 g/dL (ref 30.0–36.0)
MCV: 91.3 fL (ref 80.0–100.0)
Platelets: 319 10*3/uL (ref 150–400)
RBC: 4.72 MIL/uL (ref 3.87–5.11)
RDW: 13.5 % (ref 11.5–15.5)
WBC: 6.3 10*3/uL (ref 4.0–10.5)
nRBC: 0 % (ref 0.0–0.2)

## 2018-04-05 LAB — URINALYSIS, ROUTINE W REFLEX MICROSCOPIC
Bacteria, UA: NONE SEEN
Bilirubin Urine: NEGATIVE
Glucose, UA: NEGATIVE mg/dL
KETONES UR: NEGATIVE mg/dL
Leukocytes,Ua: NEGATIVE
NITRITE: NEGATIVE
Protein, ur: NEGATIVE mg/dL
Specific Gravity, Urine: 1.002 — ABNORMAL LOW (ref 1.005–1.030)
pH: 6 (ref 5.0–8.0)

## 2018-04-05 LAB — COMPREHENSIVE METABOLIC PANEL
ALT: 24 U/L (ref 0–44)
AST: 20 U/L (ref 15–41)
Albumin: 4.3 g/dL (ref 3.5–5.0)
Alkaline Phosphatase: 98 U/L (ref 38–126)
Anion gap: 9 (ref 5–15)
BUN: 15 mg/dL (ref 6–20)
CO2: 23 mmol/L (ref 22–32)
Calcium: 8.9 mg/dL (ref 8.9–10.3)
Chloride: 110 mmol/L (ref 98–111)
Creatinine, Ser: 0.55 mg/dL (ref 0.44–1.00)
GFR calc Af Amer: >60 mL/min (ref >60)
GFR calc non Af Amer: >60 mL/min (ref >60)
Glucose, Bld: 114 mg/dL — ABNORMAL HIGH (ref 70–99)
Potassium: 4 mmol/L (ref 3.5–5.1)
Sodium: 142 mmol/L (ref 135–145)
TOTAL PROTEIN: 7.4 g/dL (ref 6.5–8.1)
Total Bilirubin: 0.6 mg/dL (ref 0.3–1.2)

## 2018-04-05 LAB — ABO/RH: ABO/RH(D): B POS

## 2018-04-05 NOTE — Telephone Encounter (Signed)
Patient informed of CT scan results.  Advised there was no evidence of gross spread and evaluation of microscopic spread would be done after surgery by pathology. No concerns voiced. Advised to call for any needs or concerns.

## 2018-04-08 NOTE — Progress Notes (Signed)
Time changed confirmed with patient. Check in short stay at 0530. Npo solids after MN. Clear liquids until 0430. Drink eras shake at 0430. Total Npo after eras shake. Patient verbalized understanding

## 2018-04-12 ENCOUNTER — Telehealth: Payer: Self-pay

## 2018-04-12 NOTE — Telephone Encounter (Signed)
Ms Cutrona  states that she is keeping herself isolated from people. Afebrile. No cough or SOB noted. Told her that the surgery is still planned for tomorrow at this point. She will be notified if this surgery needs to be r/s due coronavirus restrictions. Pt verbalized understanding.

## 2018-04-12 NOTE — Anesthesia Preprocedure Evaluation (Addendum)
Anesthesia Evaluation  Patient identified by MRN, date of birth, ID band Patient awake    Reviewed: Allergy & Precautions, NPO status , Patient's Chart, lab work & pertinent test results  History of Anesthesia Complications Negative for: history of anesthetic complications  Airway Mallampati: II  TM Distance: >3 FB Neck ROM: Full    Dental  (+) Dental Advisory Given   Pulmonary neg pulmonary ROS,    breath sounds clear to auscultation       Cardiovascular negative cardio ROS   Rhythm:Regular Rate:Normal     Neuro/Psych negative neurological ROS     GI/Hepatic negative GI ROS, Neg liver ROS,   Endo/Other  Morbid obesity  Renal/GU negative Renal ROS     Musculoskeletal   Abdominal (+) + obese,   Peds  Hematology negative hematology ROS (+)   Anesthesia Other Findings   Reproductive/Obstetrics Endometrial cancer                            Anesthesia Physical Anesthesia Plan  ASA: II  Anesthesia Plan: General   Post-op Pain Management:    Induction: Intravenous  PONV Risk Score and Plan: 3 and Ondansetron, Dexamethasone and Scopolamine patch - Pre-op  Airway Management Planned: Oral ETT  Additional Equipment:   Intra-op Plan:   Post-operative Plan: Extubation in OR  Informed Consent: I have reviewed the patients History and Physical, chart, labs and discussed the procedure including the risks, benefits and alternatives for the proposed anesthesia with the patient or authorized representative who has indicated his/her understanding and acceptance.     Dental advisory given  Plan Discussed with: CRNA and Surgeon  Anesthesia Plan Comments: (Plan routine monitors, GETA)       Anesthesia Quick Evaluation

## 2018-04-13 ENCOUNTER — Ambulatory Visit (HOSPITAL_COMMUNITY): Payer: Self-pay | Admitting: Physician Assistant

## 2018-04-13 ENCOUNTER — Ambulatory Visit (HOSPITAL_COMMUNITY)
Admission: RE | Admit: 2018-04-13 | Discharge: 2018-04-13 | Disposition: A | Discharge status: Home / Self Care | Payer: Self-pay | Attending: Gynecologic Oncology | Admitting: Gynecologic Oncology

## 2018-04-13 ENCOUNTER — Encounter (HOSPITAL_COMMUNITY): Payer: Self-pay | Admitting: *Deleted

## 2018-04-13 ENCOUNTER — Encounter (HOSPITAL_COMMUNITY)
Admission: RE | Disposition: A | Discharge status: Home / Self Care | Payer: Self-pay | Source: Home / Self Care | Attending: Gynecologic Oncology

## 2018-04-13 ENCOUNTER — Other Ambulatory Visit: Payer: Self-pay

## 2018-04-13 ENCOUNTER — Ambulatory Visit (HOSPITAL_COMMUNITY): Payer: Self-pay | Admitting: Anesthesiology

## 2018-04-13 DIAGNOSIS — D259 Leiomyoma of uterus, unspecified: Secondary | ICD-10-CM | POA: Insufficient documentation

## 2018-04-13 DIAGNOSIS — C541 Malignant neoplasm of endometrium: Secondary | ICD-10-CM | POA: Insufficient documentation

## 2018-04-13 DIAGNOSIS — N83202 Unspecified ovarian cyst, left side: Secondary | ICD-10-CM | POA: Insufficient documentation

## 2018-04-13 DIAGNOSIS — Z6837 Body mass index (BMI) 37.0-37.9, adult: Secondary | ICD-10-CM | POA: Insufficient documentation

## 2018-04-13 DIAGNOSIS — N83201 Unspecified ovarian cyst, right side: Secondary | ICD-10-CM | POA: Insufficient documentation

## 2018-04-13 DIAGNOSIS — Z79899 Other long term (current) drug therapy: Secondary | ICD-10-CM | POA: Insufficient documentation

## 2018-04-13 HISTORY — PX: ROBOTIC ASSISTED TOTAL HYSTERECTOMY WITH BILATERAL SALPINGO OOPHERECTOMY: SHX6086

## 2018-04-13 HISTORY — PX: ROBOTIC PELVIC AND PARA-AORTIC LYMPH NODE DISSECTION: SHX6210

## 2018-04-13 LAB — TYPE AND SCREEN
ABO/RH(D): B POS
Antibody Screen: NEGATIVE

## 2018-04-13 SURGERY — HYSTERECTOMY, TOTAL, ROBOT-ASSISTED, LAPAROSCOPIC, WITH BILATERAL SALPINGO-OOPHORECTOMY
Anesthesia: General

## 2018-04-13 MED ORDER — SUGAMMADEX SODIUM 200 MG/2ML IV SOLN
INTRAVENOUS | Status: DC | PRN
  Administered 2018-04-13: 200 mg via INTRAVENOUS

## 2018-04-13 MED ORDER — ENOXAPARIN SODIUM 40 MG/0.4ML ~~LOC~~ SOLN
40.0000 mg | SUBCUTANEOUS | Status: AC
  Administered 2018-04-13: 40 mg via SUBCUTANEOUS
  Filled 2018-04-13: qty 0.4

## 2018-04-13 MED ORDER — ROCURONIUM BROMIDE 10 MG/ML (PF) SYRINGE
PREFILLED_SYRINGE | INTRAVENOUS | Status: AC
  Filled 2018-04-13: qty 10

## 2018-04-13 MED ORDER — ONDANSETRON HCL 4 MG/2ML IJ SOLN
INTRAMUSCULAR | Status: AC
  Filled 2018-04-13: qty 2

## 2018-04-13 MED ORDER — ACETAMINOPHEN 500 MG PO TABS
1000.0000 mg | ORAL_TABLET | ORAL | Status: AC
  Administered 2018-04-13: 1000 mg via ORAL
  Filled 2018-04-13: qty 2

## 2018-04-13 MED ORDER — LACTATED RINGERS IR SOLN
Status: DC | PRN
  Administered 2018-04-13: 1000 mL

## 2018-04-13 MED ORDER — STERILE WATER FOR INJECTION IJ SOLN
INTRAMUSCULAR | Status: AC
  Filled 2018-04-13: qty 10

## 2018-04-13 MED ORDER — BUPIVACAINE HCL (PF) 0.25 % IJ SOLN
INTRAMUSCULAR | Status: DC | PRN
  Administered 2018-04-13: 20 mL

## 2018-04-13 MED ORDER — EPHEDRINE SULFATE-NACL 50-0.9 MG/10ML-% IV SOSY
PREFILLED_SYRINGE | INTRAVENOUS | Status: DC | PRN
  Administered 2018-04-13 (×3): 5 mg via INTRAVENOUS
  Administered 2018-04-13: 10 mg via INTRAVENOUS

## 2018-04-13 MED ORDER — SODIUM CHLORIDE 0.9% FLUSH: 3.0000 mL | Freq: Two times a day (BID) | INTRAVENOUS | Status: DC

## 2018-04-13 MED ORDER — BUPIVACAINE HCL (PF) 0.25 % IJ SOLN
INTRAMUSCULAR | Status: AC
  Filled 2018-04-13: qty 30

## 2018-04-13 MED ORDER — FENTANYL CITRATE (PF) 100 MCG/2ML IJ SOLN
INTRAMUSCULAR | Status: DC | PRN
  Administered 2018-04-13: 50 ug via INTRAVENOUS
  Administered 2018-04-13: 25 ug via INTRAVENOUS

## 2018-04-13 MED ORDER — EPHEDRINE 5 MG/ML INJ
INTRAVENOUS | Status: AC
  Filled 2018-04-13: qty 10

## 2018-04-13 MED ORDER — HYDROMORPHONE HCL 1 MG/ML IJ SOLN
0.2500 mg | INTRAMUSCULAR | Status: DC | PRN
  Administered 2018-04-13: 0.5 mg via INTRAVENOUS

## 2018-04-13 MED ORDER — MORPHINE SULFATE (PF) 4 MG/ML IV SOLN: 2.0000 mg | INTRAVENOUS | Status: DC | PRN

## 2018-04-13 MED ORDER — MIDAZOLAM HCL 2 MG/2ML IJ SOLN: 0.5000 mg | Freq: Once | INTRAMUSCULAR | Status: DC | PRN

## 2018-04-13 MED ORDER — LIDOCAINE 2% (20 MG/ML) 5 ML SYRINGE
INTRAMUSCULAR | Status: DC | PRN
  Administered 2018-04-13: 1.5 mg/kg/h via INTRAVENOUS

## 2018-04-13 MED ORDER — CEFAZOLIN SODIUM-DEXTROSE 2-4 GM/100ML-% IV SOLN
2.0000 g | INTRAVENOUS | Status: AC
  Administered 2018-04-13: 2 g via INTRAVENOUS
  Filled 2018-04-13: qty 100

## 2018-04-13 MED ORDER — HYDROMORPHONE HCL 1 MG/ML IJ SOLN
INTRAMUSCULAR | Status: AC
  Filled 2018-04-13: qty 1

## 2018-04-13 MED ORDER — LACTATED RINGERS IV SOLN
INTRAVENOUS | Status: DC
  Administered 2018-04-13: 06:00:00 via INTRAVENOUS

## 2018-04-13 MED ORDER — SUGAMMADEX SODIUM 200 MG/2ML IV SOLN
INTRAVENOUS | Status: AC
  Filled 2018-04-13: qty 2

## 2018-04-13 MED ORDER — DEXAMETHASONE SODIUM PHOSPHATE 10 MG/ML IJ SOLN
INTRAMUSCULAR | Status: AC
  Filled 2018-04-13: qty 1

## 2018-04-13 MED ORDER — SODIUM CHLORIDE 0.9 % IV SOLN: 250.0000 mL | INTRAVENOUS | Status: DC | PRN

## 2018-04-13 MED ORDER — PROPOFOL 10 MG/ML IV BOLUS
INTRAVENOUS | Status: AC
  Filled 2018-04-13: qty 20

## 2018-04-13 MED ORDER — DEXAMETHASONE SODIUM PHOSPHATE 10 MG/ML IJ SOLN
INTRAMUSCULAR | Status: DC | PRN
  Administered 2018-04-13: 4 mg via INTRAVENOUS

## 2018-04-13 MED ORDER — SUCCINYLCHOLINE CHLORIDE 200 MG/10ML IV SOSY
PREFILLED_SYRINGE | INTRAVENOUS | Status: AC
  Filled 2018-04-13: qty 10

## 2018-04-13 MED ORDER — OXYCODONE HCL 5 MG PO TABS: 5.0000 mg | ORAL_TABLET | ORAL | Status: DC | PRN

## 2018-04-13 MED ORDER — SUCCINYLCHOLINE CHLORIDE 200 MG/10ML IV SOSY
PREFILLED_SYRINGE | INTRAVENOUS | Status: DC | PRN
  Administered 2018-04-13: 120 mg via INTRAVENOUS

## 2018-04-13 MED ORDER — PROMETHAZINE HCL 25 MG/ML IJ SOLN: 6.2500 mg | INTRAMUSCULAR | Status: DC | PRN

## 2018-04-13 MED ORDER — MIDAZOLAM HCL 2 MG/2ML IJ SOLN
INTRAMUSCULAR | Status: AC
  Filled 2018-04-13: qty 2

## 2018-04-13 MED ORDER — FENTANYL CITRATE (PF) 250 MCG/5ML IJ SOLN
INTRAMUSCULAR | Status: AC
  Filled 2018-04-13: qty 5

## 2018-04-13 MED ORDER — GABAPENTIN 300 MG PO CAPS
300.0000 mg | ORAL_CAPSULE | ORAL | Status: AC
  Administered 2018-04-13: 300 mg via ORAL
  Filled 2018-04-13: qty 1

## 2018-04-13 MED ORDER — ACETAMINOPHEN 650 MG RE SUPP
650.0000 mg | RECTAL | Status: DC | PRN
  Filled 2018-04-13: qty 1

## 2018-04-13 MED ORDER — DEXAMETHASONE SODIUM PHOSPHATE 4 MG/ML IJ SOLN: 4.0000 mg | INTRAMUSCULAR | Status: DC

## 2018-04-13 MED ORDER — LIDOCAINE 2% (20 MG/ML) 5 ML SYRINGE
INTRAMUSCULAR | Status: AC
  Filled 2018-04-13: qty 5

## 2018-04-13 MED ORDER — ONDANSETRON HCL 4 MG/2ML IJ SOLN
INTRAMUSCULAR | Status: DC | PRN
  Administered 2018-04-13: 4 mg via INTRAVENOUS

## 2018-04-13 MED ORDER — SCOPOLAMINE 1 MG/3DAYS TD PT72
1.0000 | MEDICATED_PATCH | TRANSDERMAL | Status: DC
  Administered 2018-04-13: 1.5 mg via TRANSDERMAL
  Filled 2018-04-13: qty 1

## 2018-04-13 MED ORDER — SODIUM CHLORIDE 0.9% FLUSH: 3.0000 mL | INTRAVENOUS | Status: DC | PRN

## 2018-04-13 MED ORDER — LIDOCAINE 2% (20 MG/ML) 5 ML SYRINGE
INTRAMUSCULAR | Status: DC | PRN
  Administered 2018-04-13: 80 mg via INTRAVENOUS

## 2018-04-13 MED ORDER — ROCURONIUM BROMIDE 10 MG/ML (PF) SYRINGE
PREFILLED_SYRINGE | INTRAVENOUS | Status: DC | PRN
  Administered 2018-04-13: 20 mg via INTRAVENOUS
  Administered 2018-04-13: 50 mg via INTRAVENOUS
  Administered 2018-04-13: 20 mg via INTRAVENOUS

## 2018-04-13 MED ORDER — CELECOXIB 200 MG PO CAPS
400.0000 mg | ORAL_CAPSULE | ORAL | Status: AC
  Administered 2018-04-13: 400 mg via ORAL
  Filled 2018-04-13: qty 2

## 2018-04-13 MED ORDER — KETOROLAC TROMETHAMINE 15 MG/ML IJ SOLN: 15.0000 mg | Freq: Four times a day (QID) | INTRAMUSCULAR | Status: DC

## 2018-04-13 MED ORDER — PROPOFOL 10 MG/ML IV BOLUS
INTRAVENOUS | Status: DC | PRN
  Administered 2018-04-13: 150 mg via INTRAVENOUS

## 2018-04-13 MED ORDER — MEPERIDINE HCL 50 MG/ML IJ SOLN: 6.2500 mg | INTRAMUSCULAR | Status: DC | PRN

## 2018-04-13 MED ORDER — ACETAMINOPHEN 325 MG PO TABS: 650.0000 mg | ORAL_TABLET | ORAL | Status: DC | PRN

## 2018-04-13 MED ORDER — MIDAZOLAM HCL 5 MG/5ML IJ SOLN
INTRAMUSCULAR | Status: DC | PRN
  Administered 2018-04-13: 2 mg via INTRAVENOUS

## 2018-04-13 SURGICAL SUPPLY — 53 items
APPLICATOR SURGIFLO ENDO (HEMOSTASIS) ×2 IMPLANT
BAG LAPAROSCOPIC 12 15 PORT 16 (BASKET) IMPLANT
BAG RETRIEVAL 12/15 (BASKET)
COVER BACK TABLE 60X90IN (DRAPES) ×2 IMPLANT
COVER SURGICAL LIGHT HANDLE (MISCELLANEOUS) ×2 IMPLANT
COVER TIP SHEARS 8 DVNC (MISCELLANEOUS) ×1 IMPLANT
COVER TIP SHEARS 8MM DA VINCI (MISCELLANEOUS) ×1
COVER WAND RF STERILE (DRAPES) IMPLANT
DECANTER SPIKE VIAL GLASS SM (MISCELLANEOUS) ×2 IMPLANT
DERMABOND ADVANCED (GAUZE/BANDAGES/DRESSINGS) ×1
DERMABOND ADVANCED .7 DNX12 (GAUZE/BANDAGES/DRESSINGS) ×1 IMPLANT
DRAPE ARM DVNC X/XI (DISPOSABLE) ×4 IMPLANT
DRAPE COLUMN DVNC XI (DISPOSABLE) ×1 IMPLANT
DRAPE DA VINCI XI ARM (DISPOSABLE) ×4
DRAPE DA VINCI XI COLUMN (DISPOSABLE) ×1
DRAPE SHEET LG 3/4 BI-LAMINATE (DRAPES) ×2 IMPLANT
DRAPE SURG IRRIG POUCH 19X23 (DRAPES) ×2 IMPLANT
ELECT REM PT RETURN 15FT ADLT (MISCELLANEOUS) ×2 IMPLANT
GAUZE SPONGE 4X4 16PLY XRAY LF (GAUZE/BANDAGES/DRESSINGS) ×2 IMPLANT
GLOVE BIO SURGEON STRL SZ 6 (GLOVE) ×8 IMPLANT
GLOVE BIO SURGEON STRL SZ 6.5 (GLOVE) ×4 IMPLANT
GOWN STRL REUS W/ TWL LRG LVL3 (GOWN DISPOSABLE) ×2 IMPLANT
GOWN STRL REUS W/TWL LRG LVL3 (GOWN DISPOSABLE) ×2
HOLDER FOLEY CATH W/STRAP (MISCELLANEOUS) ×2 IMPLANT
IRRIG SUCT STRYKERFLOW 2 WTIP (MISCELLANEOUS) ×2
IRRIGATION SUCT STRKRFLW 2 WTP (MISCELLANEOUS) ×1 IMPLANT
KIT PROCEDURE DA VINCI SI (MISCELLANEOUS)
KIT PROCEDURE DVNC SI (MISCELLANEOUS) IMPLANT
KIT TURNOVER KIT A (KITS) IMPLANT
MANIPULATOR UTERINE 4.5 ZUMI (MISCELLANEOUS) IMPLANT
NEEDLE HYPO 22GX1.5 SAFETY (NEEDLE) ×2 IMPLANT
NEEDLE SPNL 18GX3.5 QUINCKE PK (NEEDLE) IMPLANT
OBTURATOR OPTICAL STANDARD 8MM (TROCAR) ×1
OBTURATOR OPTICAL STND 8 DVNC (TROCAR) ×1
OBTURATOR OPTICALSTD 8 DVNC (TROCAR) ×1 IMPLANT
PACK ROBOT GYN CUSTOM WL (TRAY / TRAY PROCEDURE) ×2 IMPLANT
PAD POSITIONING PINK XL (MISCELLANEOUS) ×2 IMPLANT
PORT ACCESS TROCAR AIRSEAL 12 (TROCAR) ×1 IMPLANT
PORT ACCESS TROCAR AIRSEAL 5M (TROCAR) ×1
POUCH SPECIMEN RETRIEVAL 10MM (ENDOMECHANICALS) ×8 IMPLANT
SEAL CANN UNIV 5-8 DVNC XI (MISCELLANEOUS) ×4 IMPLANT
SEAL XI 5MM-8MM UNIVERSAL (MISCELLANEOUS) ×4
SET TRI-LUMEN FLTR TB AIRSEAL (TUBING) ×2 IMPLANT
SURGIFLO W/THROMBIN 8M KIT (HEMOSTASIS) ×2 IMPLANT
SUT VIC AB 0 CT1 27 (SUTURE)
SUT VIC AB 0 CT1 27XBRD ANTBC (SUTURE) IMPLANT
SUT VIC AB 4-0 PS2 27 (SUTURE) ×4 IMPLANT
SYR 10ML LL (SYRINGE) IMPLANT
TOWEL OR NON WOVEN STRL DISP B (DISPOSABLE) ×2 IMPLANT
TRAP SPECIMEN MUCOUS 40CC (MISCELLANEOUS) IMPLANT
TRAY FOLEY MTR SLVR 16FR STAT (SET/KITS/TRAYS/PACK) ×2 IMPLANT
UNDERPAD 30X30 (UNDERPADS AND DIAPERS) ×2 IMPLANT
WATER STERILE IRR 1000ML POUR (IV SOLUTION) ×2 IMPLANT

## 2018-04-13 NOTE — Op Note (Signed)
OPERATIVE NOTE 04/13/18  Surgeon: Donaciano Eva   Assistants: Dr Lahoma Crocker (an MD assistant was necessary for tissue manipulation, management of robotic instrumentation, retraction and positioning due to the complexity of the case and hospital policies).   Anesthesia: General endotracheal anesthesia  ASA Class: 3   Pre-operative Diagnosis: endometrial cancer grade 3 (carcinosarcoma) with tumor prolapsing through the cervix  Post-operative Diagnosis: same,   Operation: Robotic-assisted laparoscopic total hysterectomy with bilateral salpingoophorectomy, pelvic and para-aortic lymphadenectomy  Surgeon: Donaciano Eva  Assistant Surgeon: Lahoma Crocker MD  Anesthesia: GET  Urine Output: 300cc  Operative Findings:  : 8cm uterus, 4cm tumor prolapsing through the cervix, no suspicious lymph nodes.  Estimated Blood Loss:  less than 50 mL      Total IV Fluids: 1,000 ml         Specimens: uterus, cervix, bilateral tubes and ovaries, right and left pelvic lymph nodes, right and left para-aortic lymph nodes         Complications:  None; patient tolerated the procedure well.         Disposition: PACU - hemodynamically stable.  Procedure Details  The patient was seen in the Holding Room. The risks, benefits, complications, treatment options, and expected outcomes were discussed with the patient.  The patient concurred with the proposed plan, giving informed consent.  The site of surgery properly noted/marked. The patient was identified as Jessica [name] and the procedure verified as a Robotic-assisted hysterectomy with bilateral salpingo oophorectomy with pelvic and para-aortic lymphadenectomy. A Time Out was held and the above information confirmed.  After induction of anesthesia, the patient was draped and prepped in the usual sterile manner. Pt was placed in supine position after anesthesia and draped and prepped in the usual sterile manner. The abdominal drape  was placed after the CholoraPrep had been allowed to dry for 3 minutes.  Her arms were tucked to her side with all appropriate precautions.  The shoulders were stabilized with padded shoulder blocks applied to the acromium processes.  The patient was placed in the semi-lithotomy position in [place].  The perineum was prepped with Betadine. The patient was then prepped. Foley catheter was placed. An EEA sizer was placed in the vagina (a uterine manipulator could not be used because there was a tumor extruding through the cervix). An OG tube placement was confirmed and to suction.   Next, a 5 mm skin incision was made 1 cm below the subcostal margin in the midclavicular line.  The 5 mm Optiview port and scope was used for direct entry.  Opening pressure was under 10 mm CO2.  The abdomen was insufflated and the findings were noted as above.   At this point and all points during the procedure, the patient's intra-abdominal pressure did not exceed 15 mmHg. Next, a 10 mm skin incision was made in the umbilicus and a right and left port was placed about 10 cm lateral to the robot port on the right and left side.  A fourth arm was placed in the left lower quadrant 2 cm above and superior and medial to the anterior superior iliac spine.  All ports were placed under direct visualization.  The patient was placed in steep Trendelenburg.  Bowel was folded away into the upper abdomen.  The robot was docked in the normal manner.  The para-aortic lymph node dissection was performed first on the right. The peritoneum overlying the lateral surface of the common iliac and vena cava was opened vertically  towards the duodenum. It was elevated as a shelf and the ureter was identified in this retroperitoneum. It was mobilized and retracted laterally with the 4th arm. The right para-aortic dissection took place by separating an enbloc segment of node containing fat from the mid portion of the common iliac distally, the  retroperitoneal duodenum superiorally, the genitofemoral nerve laterally and the aorta medially.  Hemostasis was confirmed and was reinforced with Floseal.  The left para-aortic lymphadenectomy was performed by elevating the peritoneal edge and mobilizing the sigmoid mesentery from the underlying aorta. The left para-aortic retroperitoneal space (between the sigmoid mesentery and the lymph node bundle) was developed and the left ureter was identified and retracted with the forth arm. The left para-aortic nodes were removed en bloc by using monopolar and sharp dissection to separate all nodal fatty tissue between the mid portion of the left common iliac artery inferiorally and the IMA superiorally, the aorta medially, the vertebral bodies posteriorally and the tendon of the psoas muscle laterally.  The paravesical space was developed with monopolar and sharp dissection. It was held open with tension on the median umbilical ligament with the forth arm. The paravesical space was opened with blunt and sharp dissection to mobilize the ureter off of the medial surface of the internal iliac artery. The medial leaf of the broad ligament containing the ureter was held medially (opening the pararectal space) by the assistant's grasper. The right pelvic lymphadenectomy was performed by skeletonizing the internal iliac artery at the bifurcation with the external iliac artery. The obturator nerve was identified in the base of lateral paravesical space. The ureter was mobilized medially off of the dissection by developing the pararectal space. The genitofemoral nerve was identified, skeletonized and mobilized laterally off of the external iliac artery. An enbloc resection of lymph nodes was performed within the following boundaries: the mid portion of the common iliac proximally, the circumflex iliac vein distally, the obturator nerve posteriorally, the genitofemoral nerve laterally. The nodal basin (including obturator  space) were confirmed to be empty of nodes and hemostatic. The nodes were placed in an endocatch bag and retrieved vaginally.  The paravesical space was developed with monopolar and sharp dissection. It was held open with tension on the median umbilical ligament with the forth arm. The paravesical space was opened with blunt and sharp dissection to mobilize the ureter off of the medial surface of the internal iliac artery. The medial leaf of the broad ligament containing the ureter was held medially (opening the pararectal space) by the assistant's grasper. The left pelvic lymphadenectomy was performed by skeletonizing the internal iliac artery at the bifurcation with the external iliac artery. The obturator nerve was identified in the base of lateral paravesical space. The ureter was mobilized medially off of the dissection by developing the pararectal space. The genitofemoral nerve was identified, skeletonized and mobilized laterally off of the external iliac artery. An enbloc resection of lymph nodes was performed within the following boundaries: the mid portion of the common iliac proximally, the circumflex iliac vein distally, the obturator nerve posteriorally, the genitofemoral nerve laterally. The nodal basin (including obturator space) were confirmed to be empty of nodes and hemostatic. The nodes were placed in an endocatch bag and retrieved vaginally.  The hysterectomy was started after the round ligament on the right side was incised and the retroperitoneum was entered and the pararectal space was developed.  The ureter was noted to be on the medial leaf of the broad ligament.  The peritoneum above  the ureter was incised and stretched and the infundibulopelvic ligament was skeletonized, cauterized and cut.  The posterior peritoneum was taken down using the EEA sizer as a guide.  The anterior peritoneum was also taken down using the EEA sizer as a guide.  The bladder flap was created to the level of the  KOH ring.  The uterine artery on the right side was skeletonized, cauterized and cut in the normal manner.  A similar procedure was performed on the left.  The colpotomy was made and the uterus, cervix, bilateral ovaries and tubes were amputated and delivered through the vagina.  Pedicles were inspected and excellent hemostasis was achieved.    The colpotomy at the vaginal cuff was closed with Vicryl on a CT1 needle in a running manner.  Irrigation was used and excellent hemostasis was achieved.  At this point in the procedure was completed.  Robotic instruments were removed under direct visulaization.  The robot was undocked. The 10 mm ports were closed with Vicryl on a UR-5 needle and the fascia was closed with 0 Vicryl on a UR-5 needle.  The skin was closed with 4-0 Vicryl in a subcuticular manner.  Dermabond was applied.  Sponge, lap and needle counts correct x 2.  The patient was taken to the recovery room in stable condition.  The vagina was swabbed with  minimal bleeding noted.   All instrument and needle counts were correct x  3.   The patient was transferred to the recovery room in a running condition.  Donaciano Eva, MD

## 2018-04-13 NOTE — Interval H&P Note (Signed)
History and Physical Interval Note:  04/13/2018 7:18 AM  Jessica [name]  has presented today for surgery, with the diagnosis of endometrial cancer.  The various methods of treatment have been discussed with the patient and family. After consideration of risks, benefits and other options for treatment, the patient has consented to  Procedure(s): XI ROBOTIC ASSISTED TOTAL HYSTERECTOMY WITH BILATERAL SALPINGO OOPHORECTOMY (N/A) XI ROBOTIC PELVIC AND POSSIBLE PARA-AORTIC LYMPH NODE DISSECTION (N/A) as a surgical intervention.  The patient's history has been reviewed, patient examined, no change in status, stable for surgery.  I have reviewed the patient's chart and labs.  Questions were answered to the patient's satisfaction.     Thereasa Solo

## 2018-04-13 NOTE — Anesthesia Procedure Notes (Signed)
Procedure Name: Intubation Date/Time: 04/13/2018 7:41 AM Performed by: Victoriano Lain, CRNA Pre-anesthesia Checklist: Patient identified, Emergency Drugs available, Suction available, Patient being monitored and Timeout performed Patient Re-evaluated:Patient Re-evaluated prior to induction Oxygen Delivery Method: Circle system utilized Preoxygenation: Pre-oxygenation with 100% oxygen Induction Type: IV induction Laryngoscope Size: Mac and 4 Grade View: Grade I Tube type: Oral Tube size: 7.5 mm Number of attempts: 1 Airway Equipment and Method: Stylet Placement Confirmation: ETT inserted through vocal cords under direct vision,  positive ETCO2 and breath sounds checked- equal and bilateral Secured at: 21 cm Tube secured with: Tape Dental Injury: Teeth and Oropharynx as per pre-operative assessment

## 2018-04-13 NOTE — Interval H&P Note (Signed)
History and Physical Interval Note:  04/13/2018 7:19 AM  Jessica [name]  has presented today for surgery, with the diagnosis of endometrial cancer.  The various methods of treatment have been discussed with the patient and family. After consideration of risks, benefits and other options for treatment, the patient has consented to  Procedure(s): XI ROBOTIC ASSISTED TOTAL HYSTERECTOMY WITH BILATERAL SALPINGO OOPHORECTOMY (N/A) XI ROBOTIC PELVIC AND POSSIBLE PARA-AORTIC LYMPH NODE DISSECTION (N/A) as a surgical intervention.  The patient's history has been reviewed, patient examined, no change in status, stable for surgery.  I have reviewed the patient's chart and labs.  Questions were answered to the patient's satisfaction.     Thereasa Solo

## 2018-04-13 NOTE — Anesthesia Postprocedure Evaluation (Signed)
Anesthesia Post Note  Patient: Emina Ribaudo  Procedure(s) Performed: XI ROBOTIC ASSISTED TOTAL HYSTERECTOMY WITH BILATERAL SALPINGO OOPHORECTOMY (N/A ) XI ROBOTIC PELVIC LYMPHADECTOMY AND PARA-AORTIC LYMPH NODE DISSECTION (N/A )     Patient location during evaluation: PACU Anesthesia Type: General Level of consciousness: awake and alert, oriented and patient cooperative Pain management: pain level controlled Vital Signs Assessment: post-procedure vital signs reviewed and stable Respiratory status: spontaneous breathing, nonlabored ventilation and respiratory function stable Cardiovascular status: blood pressure returned to baseline and stable Postop Assessment: no apparent nausea or vomiting Anesthetic complications: no    Last Vitals:  Vitals:   04/13/18 1245 04/13/18 1300  BP: 130/67 134/62  Pulse: (!) 49 (!) 58  Resp: 12   Temp:    SpO2: 95% 93%    Last Pain:  Vitals:   04/13/18 1230  TempSrc:   PainSc: 0-No pain                 Lainee Lehrman,E. Aadi Bordner

## 2018-04-13 NOTE — Transfer of Care (Signed)
Immediate Anesthesia Transfer of Care Note  Patient: Jessica [name]  Procedure(s) Performed: XI ROBOTIC ASSISTED TOTAL HYSTERECTOMY WITH BILATERAL SALPINGO OOPHORECTOMY (N/A ) XI ROBOTIC PELVIC LYMPHADECTOMY AND PARA-AORTIC LYMPH NODE DISSECTION (N/A )  Patient Location: PACU  Anesthesia Type:General  Level of Consciousness: awake, alert , oriented and patient cooperative  Airway & Oxygen Therapy: Patient Spontanous Breathing and Patient connected to face mask oxygen  Post-op Assessment: Report given to RN, Post -op Vital signs reviewed and stable and Patient moving all extremities  Post vital signs: Reviewed and stable  Last Vitals:  Vitals Value Taken Time  BP 143/74 04/13/2018 10:23 AM  Temp    Pulse 78 04/13/2018 10:26 AM  Resp 20 04/13/2018 10:26 AM  SpO2 98 % 04/13/2018 10:26 AM  Vitals shown include unvalidated device data.  Last Pain:  Vitals:   04/13/18 0531  TempSrc: Oral         Complications: No apparent anesthesia complications

## 2018-04-14 ENCOUNTER — Encounter (HOSPITAL_COMMUNITY): Payer: Self-pay | Admitting: Gynecologic Oncology

## 2018-04-15 ENCOUNTER — Telehealth: Payer: Self-pay

## 2018-04-15 NOTE — Telephone Encounter (Signed)
Outgoing call per Joylene John NP for follow up with pt since her surgery, "how is she doing?"  Pt reports doing "pretty good"  "still a little sore"  Reports for pain, alternating Tylenol and Ibuprofen and trying to use Oxycodone just before bed.  Hasn't had BM yet but is using Senna before bed   Voiding well, reports a little slow to start and slightly dark in color, drinking 4 bottles of water daily.  Encouraged to drink plenty of fluids especially water to get urine more light / clear - can drink things she likes ie milk shake, have a popsickle  Reports is eating well, no nausea.    Has upcoming f/u appt on April 6th. Encouraged to call our office for any concerns ie fever, incision issue. Pt voiced understanding. No other needs per pt at this time. Reported telephone conversation to [facility] NP.

## 2018-04-16 ENCOUNTER — Encounter: Payer: Self-pay | Admitting: *Deleted

## 2018-04-16 ENCOUNTER — Telehealth: Payer: Self-pay

## 2018-04-16 NOTE — Telephone Encounter (Signed)
Told Ms Brunelle that the cancer is a Stage I with the lymph nodes negative. No other treatment is needed per Joylene John, NP. Pt verbalized understanding. Keep post op appointment as scheduled for 05-05-18.

## 2018-04-25 NOTE — Progress Notes (Signed)
Subjective:    Patient ID: Jessica [name], female    DOB: [birth year], 58 y.o.   MRN: [id number]  57 y.o.F who underwent a total abdominal hysterectomy and bilateral salpingo-oophorectomy for endometrial carcinoma on April 13, 2018.  The patient had not had previous primary care.  The patient comes into the office very emotional today and to establish for primary care and as a post hospital visit.  All of this is new for her.  She now has lost her job.  She is in need for financial assistance.  The patient comes in today stating that she is recovering from her surgery.  It was a laparoscopic robotic surgery.  She has follow-up visit with GYN oncology.  The patient denies any pre-existing history of diabetes or hypertension.  She has not had any primary care access.  Typical meal for this patient includes dry cereal and banana for breakfast she skips lunch and will have off and prepared store-bought food for dinner such as chicken and other meats.    Past Medical History:  Diagnosis Date  . Elevated blood pressure reading   . Endometrial cancer ([place])   . PMB (postmenopausal bleeding)      Family History  Problem Relation Age of Onset  . Hypertension Father   . Heart disease Father   . Cancer Father        lung  . Hypertension Mother      Social History   Socioeconomic History  . Marital status: Widowed    Spouse name: Not on file  . Number of children: Not on file  . Years of education: Not on file  . Highest education level: Not on file  Occupational History  . Not on file  Social Needs  . Financial resource strain: Not on file  . Food insecurity:    Worry: Never true    Inability: Never true  . Transportation needs:    Medical: No    Non-medical: No  Tobacco Use  . Smoking status: Never Smoker  . Smokeless tobacco: Never Used  Substance and Sexual Activity  . Alcohol use: Yes    Alcohol/week: 4.0 standard drinks    Types: 4 Glasses of wine per week   Comment: weekends  . Drug use: Never  . Sexual activity: Not Currently  Lifestyle  . Physical activity:    Days per week: Not on file    Minutes per session: Not on file  . Stress: Not on file  Relationships  . Social connections:    Talks on phone: Not on file    Gets together: Not on file    Attends religious service: Not on file    Active member of club or organization: Not on file    Attends meetings of clubs or organizations: Not on file    Relationship status: Not on file  . Intimate partner violence:    Fear of current or ex partner: Not on file    Emotionally abused: Not on file    Physically abused: Not on file    Forced sexual activity: Not on file  Other Topics Concern  . Not on file  Social History Narrative  . Not on file     No Known Allergies   Outpatient Medications Prior to Visit  Medication Sig Dispense Refill  . ibuprofen (ADVIL,MOTRIN) 600 MG tablet Take 1 tablet (600 mg total) by mouth every 6 (six) hours as needed for moderate pain. For AFTER surgery 30 tablet 1  .  senna-docusate (SENOKOT-S) 8.6-50 MG tablet Take 2 tablets by mouth at bedtime. For AFTER surgery, do not take if having loose stools 30 tablet 1  . Multiple Vitamins-Minerals (WOMENS MULTIVITAMIN) TABS Take 1 tablet by mouth daily.    Marland Kitchen oxyCODONE (OXY IR/ROXICODONE) 5 MG immediate release tablet Take 1 tablet (5 mg total) by mouth every 4 (four) hours as needed for severe pain. For AFTER surgery only, do not take and drive (Patient not taking: Reported on 04/26/2018) 10 tablet 0   No facility-administered medications prior to visit.       Review of Systems  Constitutional: Positive for activity change and unexpected weight change. Negative for fatigue and fever.  HENT: Negative.   Eyes: Negative.   Respiratory: Negative.   Cardiovascular: Negative for chest pain, palpitations and leg swelling.  Gastrointestinal: Negative.   Endocrine: Negative.   Genitourinary: Negative.    Musculoskeletal: Negative.   Skin: Negative.   Allergic/Immunologic: Negative.   Neurological: Negative.   Hematological: Negative.   Psychiatric/Behavioral: The patient is nervous/anxious.        Objective:   Physical Exam Vitals:   04/26/18 0938  BP: (!) 157/87  Pulse: 73  Resp: 18  Temp: 98.9 F (37.2 C)  TempSrc: Oral  SpO2: 99%  Weight: 231 lb (104.8 kg)  Height: 5\' 5"  (1.651 m)    Gen: Pleasant, well-nourished, in no distress,  normal affect  ENT: No lesions,  mouth clear,  oropharynx clear, no postnasal drip  Neck: No JVD, no TMG, no carotid bruits  Lungs: No use of accessory muscles, no dullness to percussion, clear without rales or rhonchi  Cardiovascular: RRR, heart sounds normal, no murmur or gallops, no peripheral edema  Abdomen: soft and NT, no HSM,  BS normal  Musculoskeletal: No deformities, no cyanosis or clubbing  Neuro: alert, non focal  Skin: Warm, no lesions or rashes BMP Latest Ref Rng & Units 04/05/2018 03/24/2018  Glucose 70 - 99 mg/dL 114(H) 125(H)  BUN 6 - 20 mg/dL 15 17  Creatinine 0.44 - 1.00 mg/dL 0.55 0.73  Sodium 135 - 145 mmol/L 142 140  Potassium 3.5 - 5.1 mmol/L 4.0 4.5  Chloride 98 - 111 mmol/L 110 108  CO2 22 - 32 mmol/L 23 22  Calcium 8.9 - 10.3 mg/dL 8.9 9.5   CBC Latest Ref Rng & Units 04/05/2018 03/24/2018  WBC 4.0 - 10.5 K/uL 6.3 7.5  Hemoglobin 12.0 - 15.0 g/dL 13.6 14.9  Hematocrit 36.0 - 46.0 % 43.1 45.1  Platelets 150 - 400 K/uL 319 334   Surgical path reviewed  All margins clear with hysterectomy     Assessment & Plan:  I personally reviewed all images and lab data in the [facility] system as well as any outside material available during this office visit and agree with the  radiology impressions.   Essential hypertension The patient has an elevated recorded blood pressure and upon looking into the chart it has been elevated in the past as well.  The patient is under a lot of stress at this time with life stressors and  loss of job and recent cancer diagnosis  Plan will be to begin amlodipine 5 mg daily and to also obtain a lipid panel and thyroid panel.  We will also educate the patient as to the hypertension diet  Endometrial cancer ([employer]) History of endometrial cancer status post resection with clean margins  Follow-up per gynecology   Jessica [name] was seen today for hospitalization follow-up.  Diagnoses and all orders  for this visit:  Essential hypertension -     Thyroid Profile  Encounter for screening for HIV -     HIV Antibody (routine testing w rflx)  Need for hepatitis C screening test -     Hepatitis C antibody  Endometrial cancer ([place]) -     Comprehensive metabolic panel -     CBC with Differential/Platelet; Future  Lipid screening -     Lipid panel  Colon cancer screening -     Fecal occult blood, imunochemical  Breast cancer screening -     MM DIGITAL SCREENING BILATERAL; Future  Other orders -     amLODipine (NORVASC) 5 MG tablet; Take 1 tablet (5 mg total) by mouth daily.   A tetanus vaccine was administered and we will also obtain a hepatitis C and HIV study and administered to the patient a fecal blood study and mammogram will be ordered

## 2018-04-26 ENCOUNTER — Ambulatory Visit: Payer: Self-pay | Attending: Critical Care Medicine | Admitting: Critical Care Medicine

## 2018-04-26 ENCOUNTER — Encounter: Payer: Self-pay | Admitting: Critical Care Medicine

## 2018-04-26 ENCOUNTER — Other Ambulatory Visit: Payer: Self-pay

## 2018-04-26 VITALS — BP 157/87 | HR 73 | Temp 98.9°F | Resp 18 | Ht 65.0 in | Wt 231.0 lb

## 2018-04-26 DIAGNOSIS — Z114 Encounter for screening for human immunodeficiency virus [HIV]: Secondary | ICD-10-CM

## 2018-04-26 DIAGNOSIS — I10 Essential (primary) hypertension: Secondary | ICD-10-CM | POA: Insufficient documentation

## 2018-04-26 DIAGNOSIS — C541 Malignant neoplasm of endometrium: Secondary | ICD-10-CM

## 2018-04-26 DIAGNOSIS — Z1211 Encounter for screening for malignant neoplasm of colon: Secondary | ICD-10-CM

## 2018-04-26 DIAGNOSIS — Z1239 Encounter for other screening for malignant neoplasm of breast: Secondary | ICD-10-CM

## 2018-04-26 DIAGNOSIS — Z13220 Encounter for screening for lipoid disorders: Secondary | ICD-10-CM

## 2018-04-26 DIAGNOSIS — Z1159 Encounter for screening for other viral diseases: Secondary | ICD-10-CM

## 2018-04-26 MED ORDER — AMLODIPINE BESYLATE 5 MG PO TABS: 5.0000 mg | ORAL_TABLET | Freq: Every day | ORAL | 6 refills | Status: DC

## 2018-04-26 MED FILL — AMLODIPINE BESYLATE 5 MG TA: 5 | 30 days supply | Qty: 30 | Fill #0

## 2018-04-26 NOTE — Assessment & Plan Note (Signed)
History of endometrial cancer status post resection with clean margins  Follow-up per gynecology

## 2018-04-26 NOTE — Patient Instructions (Addendum)
Begin amlodipine 5 mg daily  Labs today will include complete metabolic panel, lipid panel, thyroid panel, HIV and hepatitis C screen, a card to check blood in the stool be given to you as well  Follow the Dash diet below for your blood pressure  We will establish a primary care visit with you in the next 2 months  Keep your appointments with oncology gynecology  Financial counseling will be provided for an orange card and blue card  I will ask our licensed clinical social worker Christa See to contact you   Hypertension Hypertension, commonly called high blood pressure, is when the force of blood pumping through the arteries is too strong. The arteries are the blood vessels that carry blood from the heart throughout the body. Hypertension forces the heart to work harder to pump blood and may cause arteries to become narrow or stiff. Having untreated or uncontrolled hypertension can cause heart attacks, strokes, kidney disease, and other problems. A blood pressure reading consists of a higher number over a lower number. Ideally, your blood pressure should be below 120/80. The first ("top") number is called the systolic pressure. It is a measure of the pressure in your arteries as your heart beats. The second ("bottom") number is called the diastolic pressure. It is a measure of the pressure in your arteries as the heart relaxes. What are the causes? The cause of this condition is not known. What increases the risk? Some risk factors for high blood pressure are under your control. Others are not. Factors you can change  Smoking.  Having type 2 diabetes mellitus, high cholesterol, or both.  Not getting enough exercise or physical activity.  Being overweight.  Having too much fat, sugar, calories, or salt (sodium) in your diet.  Drinking too much alcohol. Factors that are difficult or impossible to change  Having chronic kidney disease.  Having a family history of high blood  pressure.  Age. Risk increases with age.  Race. You may be at higher risk if you are African-American.  Gender. Men are at higher risk than women before age 15. After age 48, women are at higher risk than men.  Having obstructive sleep apnea.  Stress. What are the signs or symptoms? Extremely high blood pressure (hypertensive crisis) may cause:  Headache.  Anxiety.  Shortness of breath.  Nosebleed.  Nausea and vomiting.  Severe chest pain.  Jerky movements you cannot control (seizures). How is this diagnosed? This condition is diagnosed by measuring your blood pressure while you are seated, with your arm resting on a surface. The cuff of the blood pressure monitor will be placed directly against the skin of your upper arm at the level of your heart. It should be measured at least twice using the same arm. Certain conditions can cause a difference in blood pressure between your right and left arms. Certain factors can cause blood pressure readings to be lower or higher than normal (elevated) for a short period of time:  When your blood pressure is higher when you are in a health care provider's office than when you are at home, this is called white coat hypertension. Most people with this condition do not need medicines.  When your blood pressure is higher at home than when you are in a health care provider's office, this is called masked hypertension. Most people with this condition may need medicines to control blood pressure. If you have a high blood pressure reading during one visit or you have normal  blood pressure with other risk factors:  You may be asked to return on a different day to have your blood pressure checked again.  You may be asked to monitor your blood pressure at home for 1 week or longer. If you are diagnosed with hypertension, you may have other blood or imaging tests to help your health care provider understand your overall risk for other conditions. How  is this treated? This condition is treated by making healthy lifestyle changes, such as eating healthy foods, exercising more, and reducing your alcohol intake. Your health care provider may prescribe medicine if lifestyle changes are not enough to get your blood pressure under control, and if:  Your systolic blood pressure is above 130.  Your diastolic blood pressure is above 80. Your personal target blood pressure may vary depending on your medical conditions, your age, and other factors. Follow these instructions at home: Eating and drinking   Eat a diet that is high in fiber and potassium, and low in sodium, added sugar, and fat. An example eating plan is called the DASH (Dietary Approaches to Stop Hypertension) diet. To eat this way: ? Eat plenty of fresh fruits and vegetables. Try to fill half of your plate at each meal with fruits and vegetables. ? Eat whole grains, such as whole wheat pasta, brown rice, or whole grain bread. Fill about one quarter of your plate with whole grains. ? Eat or drink low-fat dairy products, such as skim milk or low-fat yogurt. ? Avoid fatty cuts of meat, processed or cured meats, and poultry with skin. Fill about one quarter of your plate with lean proteins, such as fish, chicken without skin, beans, eggs, and tofu. ? Avoid premade and processed foods. These tend to be higher in sodium, added sugar, and fat.  Reduce your daily sodium intake. Most people with hypertension should eat less than 1,500 mg of sodium a day.  Limit alcohol intake to no more than 1 drink a day for nonpregnant women and 2 drinks a day for men. One drink equals 12 oz of beer, 5 oz of wine, or 1 oz of hard liquor. Lifestyle   Work with your health care provider to maintain a healthy body weight or to lose weight. Ask what an ideal weight is for you.  Get at least 30 minutes of exercise that causes your heart to beat faster (aerobic exercise) most days of the week. Activities may  include walking, swimming, or biking.  Include exercise to strengthen your muscles (resistance exercise), such as pilates or lifting weights, as part of your weekly exercise routine. Try to do these types of exercises for 30 minutes at least 3 days a week.  Do not use any products that contain nicotine or tobacco, such as cigarettes and e-cigarettes. If you need help quitting, ask your health care provider.  Monitor your blood pressure at home as told by your health care provider.  Keep all follow-up visits as told by your health care provider. This is important. Medicines  Take over-the-counter and prescription medicines only as told by your health care provider. Follow directions carefully. Blood pressure medicines must be taken as prescribed.  Do not skip doses of blood pressure medicine. Doing this puts you at risk for problems and can make the medicine less effective.  Ask your health care provider about side effects or reactions to medicines that you should watch for. Contact a health care provider if:  You think you are having a reaction to a  medicine you are taking.  You have headaches that keep coming back (recurring).  You feel dizzy.  You have swelling in your ankles.  You have trouble with your vision. Get help right away if:  You develop a severe headache or confusion.  You have unusual weakness or numbness.  You feel faint.  You have severe pain in your chest or abdomen.  You vomit repeatedly.  You have trouble breathing. Summary  Hypertension is when the force of blood pumping through your arteries is too strong. If this condition is not controlled, it may put you at risk for serious complications.  Your personal target blood pressure may vary depending on your medical conditions, your age, and other factors. For most people, a normal blood pressure is less than 120/80.  Hypertension is treated with lifestyle changes, medicines, or a combination of both.  Lifestyle changes include weight loss, eating a healthy, low-sodium diet, exercising more, and limiting alcohol. This information is not intended to replace advice given to you by your health care provider. Make sure you discuss any questions you have with your health care provider. Document Released: 01/06/2005 Document Revised: 12/05/2015 Document Reviewed: 12/05/2015 Elsevier Interactive Patient Education  2019 [employer].

## 2018-04-26 NOTE — Assessment & Plan Note (Signed)
The patient has an elevated recorded blood pressure and upon looking into the chart it has been elevated in the past as well.  The patient is under a lot of stress at this time with life stressors and loss of job and recent cancer diagnosis  Plan will be to begin amlodipine 5 mg daily and to also obtain a lipid panel and thyroid panel.  We will also educate the patient as to the hypertension diet

## 2018-04-27 ENCOUNTER — Telehealth: Payer: Self-pay | Admitting: Critical Care Medicine

## 2018-04-27 DIAGNOSIS — E785 Hyperlipidemia, unspecified: Secondary | ICD-10-CM | POA: Insufficient documentation

## 2018-04-27 DIAGNOSIS — E7800 Pure hypercholesterolemia, unspecified: Secondary | ICD-10-CM

## 2018-04-27 LAB — COMPREHENSIVE METABOLIC PANEL
ALT: 55 IU/L — ABNORMAL HIGH (ref 0–32)
AST: 35 IU/L (ref 0–40)
Albumin/Globulin Ratio: 1.9 (ref 1.2–2.2)
Albumin: 4.2 g/dL (ref 3.8–4.9)
Alkaline Phosphatase: 118 IU/L — ABNORMAL HIGH (ref 39–117)
BUN/Creatinine Ratio: 23 (ref 9–23)
BUN: 13 mg/dL (ref 6–24)
Bilirubin Total: 0.3 mg/dL (ref 0.0–1.2)
CO2: 21 mmol/L (ref 20–29)
Calcium: 8.7 mg/dL (ref 8.7–10.2)
Chloride: 104 mmol/L (ref 96–106)
Creatinine, Ser: 0.56 mg/dL — ABNORMAL LOW (ref 0.57–1.00)
GFR calc Af Amer: 121 mL/min/{1.73_m2} (ref >59)
GFR calc non Af Amer: 105 mL/min/{1.73_m2} (ref >59)
Globulin, Total: 2.2 g/dL (ref 1.5–4.5)
Glucose: 94 mg/dL (ref 65–99)
Potassium: 4.5 mmol/L (ref 3.5–5.2)
Sodium: 141 mmol/L (ref 134–144)
Total Protein: 6.4 g/dL (ref 6.0–8.5)

## 2018-04-27 LAB — LIPID PANEL
Chol/HDL Ratio: 2.9 ratio (ref 0.0–4.4)
Cholesterol, Total: 260 mg/dL — ABNORMAL HIGH (ref 100–199)
HDL: 90 mg/dL (ref >39)
LDL Calculated: 155 mg/dL — ABNORMAL HIGH (ref 0–99)
Triglycerides: 74 mg/dL (ref 0–149)
VLDL Cholesterol Cal: 15 mg/dL (ref 5–40)

## 2018-04-27 LAB — THYROID PANEL
Free Thyroxine Index: 1.7 (ref 1.2–4.9)
T3 Uptake Ratio: 22 % — ABNORMAL LOW (ref 24–39)
T4, Total: 7.9 ug/dL (ref 4.5–12.0)

## 2018-04-27 LAB — HIV ANTIBODY (ROUTINE TESTING W REFLEX): HIV Screen 4th Generation wRfx: NONREACTIVE

## 2018-04-27 LAB — HEPATITIS C ANTIBODY: Hep C Virus Ab: <0.1 s/co ratio (ref 0.0–0.9)

## 2018-04-27 MED ORDER — ATORVASTATIN CALCIUM 20 MG PO TABS: 20.0000 mg | ORAL_TABLET | Freq: Every day | ORAL | 3 refills | Status: DC

## 2018-04-27 NOTE — Telephone Encounter (Signed)
Pt aware of results   Lipids high, needs atorvastatin 20mg  daily, will send to pharmacy and mail to the patient

## 2018-04-28 ENCOUNTER — Telehealth: Payer: Self-pay

## 2018-04-28 MED FILL — ATORVASTATIN 20 MG TABLET: 20 | 30 days supply | Qty: 30 | Fill #0

## 2018-04-28 NOTE — Telephone Encounter (Signed)
Jessica [name]  states that she has not been able to have a BM.  She has been using the Senokot-S as directed.  She used a fleet enema with no results. Abdomen cramping. Told her to get Mag-Citrate and drink a have a bottle and then drink the other half 2 hours later. She may have more cramping and pain until the "plug" comes out.  She will also have loode stools after as well since she has been using the senokot-S. She needs to keep up with the fluid intake 8 oz every ~90 minutes while awake. Pt verbalized understanding.

## 2018-04-29 ENCOUNTER — Other Ambulatory Visit: Payer: Self-pay

## 2018-04-29 ENCOUNTER — Emergency Department (HOSPITAL_COMMUNITY)
Admission: EM | Admit: 2018-04-29 | Discharge: 2018-04-29 | Disposition: A | Discharge status: Home / Self Care | Payer: Self-pay | Attending: Emergency Medicine | Admitting: Emergency Medicine

## 2018-04-29 ENCOUNTER — Emergency Department (HOSPITAL_COMMUNITY): Payer: Self-pay

## 2018-04-29 DIAGNOSIS — M79604 Pain in right leg: Secondary | ICD-10-CM

## 2018-04-29 DIAGNOSIS — Z79899 Other long term (current) drug therapy: Secondary | ICD-10-CM | POA: Insufficient documentation

## 2018-04-29 DIAGNOSIS — K5900 Constipation, unspecified: Secondary | ICD-10-CM | POA: Insufficient documentation

## 2018-04-29 DIAGNOSIS — M79605 Pain in left leg: Secondary | ICD-10-CM | POA: Insufficient documentation

## 2018-04-29 DIAGNOSIS — I10 Essential (primary) hypertension: Secondary | ICD-10-CM | POA: Insufficient documentation

## 2018-04-29 LAB — CBC
HCT: 43.2 % (ref 36.0–46.0)
Hemoglobin: 14.2 g/dL (ref 12.0–15.0)
MCH: 29.5 pg (ref 26.0–34.0)
MCHC: 32.9 g/dL (ref 30.0–36.0)
MCV: 89.8 fL (ref 80.0–100.0)
Platelets: 361 10*3/uL (ref 150–400)
RBC: 4.81 MIL/uL (ref 3.87–5.11)
RDW: 12.7 % (ref 11.5–15.5)
WBC: 17 10*3/uL — ABNORMAL HIGH (ref 4.0–10.5)
nRBC: 0 % (ref 0.0–0.2)

## 2018-04-29 LAB — URINALYSIS, MICROSCOPIC (REFLEX)

## 2018-04-29 LAB — COMPREHENSIVE METABOLIC PANEL
ALT: 115 U/L — ABNORMAL HIGH (ref 0–44)
AST: 80 U/L — ABNORMAL HIGH (ref 15–41)
Albumin: 4 g/dL (ref 3.5–5.0)
Alkaline Phosphatase: 180 U/L — ABNORMAL HIGH (ref 38–126)
Anion gap: 13 (ref 5–15)
BUN: 8 mg/dL (ref 6–20)
CO2: 20 mmol/L — ABNORMAL LOW (ref 22–32)
Calcium: 9.2 mg/dL (ref 8.9–10.3)
Chloride: 104 mmol/L (ref 98–111)
Creatinine, Ser: 0.43 mg/dL — ABNORMAL LOW (ref 0.44–1.00)
GFR calc Af Amer: >60 mL/min (ref >60)
GFR calc non Af Amer: >60 mL/min (ref >60)
Glucose, Bld: 97 mg/dL (ref 70–99)
Potassium: 4.1 mmol/L (ref 3.5–5.1)
Sodium: 137 mmol/L (ref 135–145)
Total Bilirubin: 0.9 mg/dL (ref 0.3–1.2)
Total Protein: 8 g/dL (ref 6.5–8.1)

## 2018-04-29 LAB — URINALYSIS, ROUTINE W REFLEX MICROSCOPIC
Bilirubin Urine: NEGATIVE
Glucose, UA: NEGATIVE mg/dL
Ketones, ur: 15 mg/dL — AB
Nitrite: NEGATIVE
Protein, ur: NEGATIVE mg/dL
Specific Gravity, Urine: <1.005 — ABNORMAL LOW (ref 1.005–1.030)
pH: 5.5 (ref 5.0–8.0)

## 2018-04-29 LAB — LIPASE, BLOOD: Lipase: 27 U/L (ref 11–51)

## 2018-04-29 MED ORDER — SODIUM CHLORIDE (PF) 0.9 % IJ SOLN
INTRAMUSCULAR | Status: AC
  Filled 2018-04-29: qty 50

## 2018-04-29 MED ORDER — ONDANSETRON HCL 4 MG/2ML IJ SOLN
4.0000 mg | Freq: Once | INTRAMUSCULAR | Status: AC
  Administered 2018-04-29: 4 mg via INTRAVENOUS
  Filled 2018-04-29: qty 2

## 2018-04-29 MED ORDER — ONDANSETRON 4 MG PO TBDP: ORAL_TABLET | ORAL | 0 refills | Status: DC

## 2018-04-29 MED ORDER — IOHEXOL 300 MG/ML  SOLN
100.0000 mL | Freq: Once | INTRAMUSCULAR | Status: AC | PRN
  Administered 2018-04-29: 100 mL via INTRAVENOUS

## 2018-04-29 MED ORDER — SODIUM CHLORIDE 0.9 % IV BOLUS
1000.0000 mL | Freq: Once | INTRAVENOUS | Status: AC
  Administered 2018-04-29: 1000 mL via INTRAVENOUS

## 2018-04-29 MED ORDER — SODIUM CHLORIDE 0.9% FLUSH: 3.0000 mL | Freq: Once | INTRAVENOUS | Status: DC

## 2018-04-29 MED ORDER — KETOROLAC TROMETHAMINE 30 MG/ML IJ SOLN
15.0000 mg | Freq: Once | INTRAMUSCULAR | Status: AC
  Administered 2018-04-29: 20:00:00 15 mg via INTRAVENOUS
  Filled 2018-04-29: qty 1

## 2018-04-29 NOTE — Discharge Instructions (Addendum)
Take 8 scoops of miralax in 32oz of whatever you would like to drink.(Gatorade comes in this size) You can also use a fleets enema which you can buy over the counter at the pharmacy.  Return for worsening abdominal pain, vomiting or fever.   Unfortunately the vascular ultrasound people are not here to perform a DVT study to rule out a blood clot in your leg.  I have placed an order so he can return in the morning if you would like to have that evaluated.

## 2018-04-29 NOTE — ED Provider Notes (Signed)
[place] DEPT Provider Note   CSN: [id number] Arrival date & time: 04/29/18  1909    History   Chief Complaint Chief Complaint  Patient presents with   Abdominal Pain   Leg Pain    HPI Jessica [name] is a 58 y.o. female.     58 yo F with a chief complaint of constipation.  Patient has not had a bowel movement in about 10 days.  She states she does pass flatus but feels that it is very minimal.  Has had some nausea but denies vomiting.  Had laparoscopic hysterectomy done about 2 weeks ago.  She denies any significant vaginal bleeding or discharge.  Has diffuse abdominal cramping. Tried multiple OTC regimens without improvement.   The history is provided by the patient.  Abdominal Pain  Pain location:  Generalized Pain quality: cramping   Pain radiates to:  Does not radiate Pain severity:  Severe Onset quality:  Gradual Duration:  10 days Timing:  Constant Progression:  Worsening Chronicity:  New Relieved by:  Nothing Worsened by:  Nothing Ineffective treatments:  None tried Associated symptoms: constipation and nausea   Associated symptoms: no chest pain, no chills, no dysuria, no fever, no shortness of breath and no vomiting   Leg Pain  Associated symptoms: no fever     Past Medical History:  Diagnosis Date   Elevated blood pressure reading    Endometrial cancer (HCC)    PMB (postmenopausal bleeding)     Patient Active Problem List   Diagnosis Date Noted   Hyperlipidemia 04/27/2018   Essential hypertension 04/26/2018   Endometrial cancer ([place]) 03/31/2018    Past Surgical History:  Procedure Laterality Date   NECK SURGERY  2000   spinal surgery , titanium rod in place    ROBOTIC ASSISTED TOTAL HYSTERECTOMY WITH BILATERAL SALPINGO OOPHERECTOMY N/A 04/13/2018   Procedure: XI ROBOTIC ASSISTED TOTAL HYSTERECTOMY WITH BILATERAL SALPINGO OOPHORECTOMY;  Surgeon: Everitt Amber, MD;  Location: [facility];  Service: Gynecology;   Laterality: N/A;   ROBOTIC PELVIC AND PARA-AORTIC LYMPH NODE DISSECTION N/A 04/13/2018   Procedure: XI ROBOTIC PELVIC LYMPHADECTOMY AND PARA-AORTIC LYMPH NODE DISSECTION;  Surgeon: Everitt Amber, MD;  Location: [facility];  Service: Gynecology;  Laterality: N/A;     OB History    Gravida  4   Para  3   Term  3   Preterm  0   AB  1   Living  3     SAB  0   TAB  1   Ectopic  0   Multiple  0   Live Births  3            Home Medications    Prior to Admission medications   Medication Sig Start Date End Date Taking? Authorizing Provider  acetaminophen (TYLENOL) 500 MG tablet Take 1,000 mg by mouth every 6 (six) hours as needed for mild pain.   Yes [provider]  ibuprofen (ADVIL,MOTRIN) 600 MG tablet Take 1 tablet (600 mg total) by mouth every 6 (six) hours as needed for moderate pain. For AFTER surgery 03/31/18  Yes Cross, Lenna Sciara D, NP  Multiple Vitamins-Minerals (WOMENS MULTIVITAMIN) TABS Take 1 tablet by mouth daily.   Yes [provider]  amLODipine (NORVASC) 5 MG tablet Take 1 tablet (5 mg total) by mouth daily. 04/26/18   Elsie Stain, MD  atorvastatin (LIPITOR) 20 MG tablet Take 1 tablet (20 mg total) by mouth daily. 04/27/18   Elsie Stain,  MD  ondansetron (ZOFRAN ODT) 4 MG disintegrating tablet 4mg  ODT q4 hours prn nausea/vomit 04/29/18   Deno Etienne, DO  senna-docusate (SENOKOT-S) 8.6-50 MG tablet Take 2 tablets by mouth at bedtime. For AFTER surgery, do not take if having loose stools Patient not taking: Reported on 04/29/2018 03/31/18   Dorothyann Gibbs, NP    Family History Family History  Problem Relation Age of Onset   Hypertension Father    Heart disease Father    Cancer Father        lung   Hypertension Mother     Social History Social History   Tobacco Use   Smoking status: Never Smoker   Smokeless tobacco: Never Used  Substance Use Topics   Alcohol use: Yes    Alcohol/week: 4.0 standard drinks    Types: 4 Glasses of  wine per week    Comment: weekends   Drug use: Never     Allergies   Patient has no known allergies.   Review of Systems Review of Systems  Constitutional: Negative for chills and fever.  HENT: Negative for congestion and rhinorrhea.   Eyes: Negative for redness and visual disturbance.  Respiratory: Negative for shortness of breath and wheezing.   Cardiovascular: Negative for chest pain and palpitations.  Gastrointestinal: Positive for abdominal pain, constipation and nausea. Negative for vomiting.  Genitourinary: Negative for dysuria and urgency.  Musculoskeletal: Negative for arthralgias and myalgias.  Skin: Negative for pallor and wound.  Neurological: Negative for dizziness and headaches.     Physical Exam Updated Vital Signs BP (!) 164/108 (BP Location: Right Arm)    Pulse 88    Temp 98.6 F (37 C) (Oral)    Resp 20    Ht 5\' 5"  (1.651 m)    Wt 103 kg    SpO2 99%    BMI 37.77 kg/m   Physical Exam Vitals signs and nursing note reviewed.  Constitutional:      General: She is not in acute distress.    Appearance: She is well-developed. She is obese. She is not diaphoretic.  HENT:     Head: Normocephalic and atraumatic.  Eyes:     Pupils: Pupils are equal, round, and reactive to light.  Neck:     Musculoskeletal: Normal range of motion and neck supple.  Cardiovascular:     Rate and Rhythm: Normal rate and regular rhythm.     Heart sounds: No murmur. No friction rub. No gallop.   Pulmonary:     Effort: Pulmonary effort is normal.     Breath sounds: No wheezing or rales.  Abdominal:     General: There is no distension.     Palpations: Abdomen is soft.     Tenderness: There is abdominal tenderness (mild diffuse).  Musculoskeletal:        General: No tenderness.  Skin:    General: Skin is warm and dry.  Neurological:     Mental Status: She is alert and oriented to person, place, and time.  Psychiatric:        Behavior: Behavior normal.      ED Treatments /  Results  Labs (all labs ordered are listed, but only abnormal results are displayed) Labs Reviewed  COMPREHENSIVE METABOLIC PANEL - Abnormal; Notable for the following components:      Result Value   CO2 20 (*)    Creatinine, Ser 0.43 (*)    AST 80 (*)    ALT 115 (*)    Alkaline Phosphatase  180 (*)    All other components within normal limits  CBC - Abnormal; Notable for the following components:   WBC 17.0 (*)    All other components within normal limits  LIPASE, BLOOD  URINALYSIS, ROUTINE W REFLEX MICROSCOPIC    EKG None  Radiology Ct Abdomen Pelvis W Contrast  Result Date: 04/29/2018 CLINICAL DATA:  Bowel obstruction. Post hysterectomy 2 weeks ago with left lower extremity pain and constipation. Patient reports last bowel movement 10 days ago. EXAM: CT ABDOMEN AND PELVIS WITH CONTRAST TECHNIQUE: Multidetector CT imaging of the abdomen and pelvis was performed using the standard protocol following bolus administration of intravenous contrast. CONTRAST:  156mL OMNIPAQUE IOHEXOL 300 MG/ML  SOLN COMPARISON:  CT 04/12/2018 FINDINGS: Lower chest: Compressive atelectasis in the medial right lower lobe adjacent to osteophytes. No pleural fluid, consolidation, or pulmonary nodule. Hepatobiliary: No focal hepatic abnormality. Again seen gallstones without pericholecystic inflammation. No biliary dilatation. Small amount of perihepatic fluid measures simple fluid density. Pancreas: No ductal dilatation or inflammation. Spleen: Normal in size without focal abnormality. Adrenals/Urinary Tract: Normal adrenal glands. No hydronephrosis or perinephric edema. Homogeneous renal enhancement with symmetric excretion on delayed phase imaging. Urinary bladder is partially distended. Mild bladder wall thickening. Stomach/Bowel: Bowel evaluation is limited in the absence of enteric contrast. Small hiatal hernia. Stomach is nondistended. No small bowel obstruction or abnormal distention. Short segment of small  bowel wall thickening in the central abdomen, images 45-50 series 2, nonspecific. Liquid and semi formed stool throughout the colon without colonic inflammation. No abnormal rectal distention. Appendix tentatively identified and normal. Vascular/Lymphatic: Mild aortic atherosclerosis without aneurysm. Mild retroperitoneal edema in the lower abdomen and pelvis likely related to prior lymph node dissection. There is a 2.3 x 2.0 cm oval low-density structure measuring simple fluid in the right common iliac station just at the iliac bifurcation and to the right of the IVC. Oval 3.0 x 2.0 cm fluid density structure in the left external iliac station lateral to the vessels. No peripheral enhancement or internal air. Reproductive: Post hysterectomy. Generalized stranding in the pelvis and operative bed without focal fluid collection or abscess. Small amount of free fluid. Other: Scattered air in the left anterior abdominal wall likely sequela of recent surgery. No abdominal wall fluid collection. No free intra-abdominal air. Minimal pelvic free fluid. No organized pelvic fluid collection. Musculoskeletal: There are no acute or suspicious osseous abnormalities. IMPRESSION: 1. No bowel obstruction. Liquid and semi formed stool throughout the colon without rectal distension or significant increased stool burden. No evidence of bowel obstruction. Short segment small bowel wall thickening in the central abdomen with mild adjacent edema that is nonspecific, may be persistent irritation from recent abdominal surgery, or infectious enteritis. 2. Post recent hysterectomy with pelvic and periaortic lymph node dissection. Expected stranding and small amount of free fluid in the hysterectomy operative bed. There are 2 ovoid fluid density structures each measuring approximately 3 cm without peripheral enhancement, 1 to the right of the IVC at the iliac bifurcation, and 1 at the left external iliac station. These likely represent  postoperative seromas, however technically indeterminate in regards to sterility by CT. 3. Incidental gallstones. Electronically Signed   By: Keith Rake M.D.   On: 04/29/2018 21:03    Procedures Procedures (including critical care time)  Medications Ordered in ED Medications  sodium chloride flush (NS) 0.9 % injection 3 mL (3 mLs Intravenous Not Given 04/29/18 1954)  sodium chloride (PF) 0.9 % injection (has no administration in time  range)  sodium chloride (PF) 0.9 % injection (has no administration in time range)  sodium chloride 0.9 % bolus 1,000 mL (0 mLs Intravenous Stopped 04/29/18 2055)  ketorolac (TORADOL) 30 MG/ML injection 15 mg (15 mg Intravenous Given 04/29/18 1949)  ondansetron (ZOFRAN) injection 4 mg (4 mg Intravenous Given 04/29/18 1949)  iohexol (OMNIPAQUE) 300 MG/ML solution 100 mL (100 mLs Intravenous Contrast Given 04/29/18 2018)     Initial Impression / Assessment and Plan / ED Course  I have reviewed the triage vital signs and the nursing notes.  Pertinent labs & imaging results that were available during my care of the patient were reviewed by me and considered in my medical decision making (see chart for details).        58 yo F with a chief complaint of constipation.  Patient being just about 2 weeks postop and having significant difficulty having bowel movements and minimal flatus and now nausea concern for possible bowel obstruction.  Will obtain a CT scan.  If this is negative will perform a rectal exam and discharged with cleanout protocol.  Rectal exam with no noted stool in the vault.  CT scan with likely normal postsurgical changes from her hysterectomy.  We will have her trial MiraLAX cleanout at home.  Have her call her OB/GYN tomorrow and discuss her visit here.  I was unable to obtain a DVT ultrasound due to the time of arrival and so I have placed an order for her to get it tomorrow morning.  9:35 PM:  I have discussed the diagnosis/risks/treatment  options with the patient and believe the pt to be eligible for discharge home to follow-up with GYN. We also discussed returning to the ED immediately if new or worsening sx occur. We discussed the sx which are most concerning (e.g., sudden worsening pain, fever, inability to tolerate by mouth) that necessitate immediate return. Medications administered to the patient during their visit and any new prescriptions provided to the patient are listed below.  Medications given during this visit Medications  sodium chloride flush (NS) 0.9 % injection 3 mL (3 mLs Intravenous Not Given 04/29/18 1954)  sodium chloride (PF) 0.9 % injection (has no administration in time range)  sodium chloride (PF) 0.9 % injection (has no administration in time range)  sodium chloride 0.9 % bolus 1,000 mL (0 mLs Intravenous Stopped 04/29/18 2055)  ketorolac (TORADOL) 30 MG/ML injection 15 mg (15 mg Intravenous Given 04/29/18 1949)  ondansetron (ZOFRAN) injection 4 mg (4 mg Intravenous Given 04/29/18 1949)  iohexol (OMNIPAQUE) 300 MG/ML solution 100 mL (100 mLs Intravenous Contrast Given 04/29/18 2018)     The patient appears reasonably screen and/or stabilized for discharge and I doubt any other medical condition or other [facility] requiring further screening, evaluation, or treatment in the ED at this time prior to discharge.    Final Clinical Impressions(s) / ED Diagnoses   Final diagnoses:  Left leg pain    ED Discharge Orders         Ordered    LE VENOUS     04/29/18 2129    ondansetron (ZOFRAN ODT) 4 MG disintegrating tablet     04/29/18 2129           Deno Etienne, DO 04/29/18 2135

## 2018-04-29 NOTE — ED Triage Notes (Signed)
Pt is s/p hysterectomy x 2 weeks.  Reports LLE pain x 3 days.  She also reports constipation, LBM - 10 days ago.  She has been taking percocets after her hysterectomy.  She has been taking stool softener daily without relief.

## 2018-04-29 NOTE — ED Notes (Addendum)
She reports she was given magnesium citrate yesterday without relief.  It only made her abd cramp up

## 2018-04-30 ENCOUNTER — Telehealth: Payer: Self-pay

## 2018-04-30 ENCOUNTER — Ambulatory Visit (HOSPITAL_COMMUNITY): Admission: RE | Admit: 2018-04-30 | Payer: Self-pay | Source: Ambulatory Visit

## 2018-04-30 ENCOUNTER — Telehealth: Payer: Self-pay | Admitting: Gynecologic Oncology

## 2018-04-30 ENCOUNTER — Encounter (HOSPITAL_COMMUNITY): Payer: Self-pay

## 2018-04-30 NOTE — Telephone Encounter (Signed)
Ms Selbe called to discuss the FMLA papers are not correct on one page. Received faxed FMLA papers from [place]. Monday will call [phone] and speak to the [facility] group to determine what information is not correct.  Ms Halberg said that Dr. Denman George spoke with her and she is to call the after hours number latter tonight to give an update as to how she is feeling after she passes more liquid. Pt states that the zofran has helped decrease the nausea.   Gave Ms Acoff the appointment for the doppler study at [facility].  Building that has the [employer] on ground floor. Pt verbalized understanding.

## 2018-04-30 NOTE — Telephone Encounter (Signed)
Pt's temp today is 97.4. She was told last night to take 8 scoops of Miralax today. She is going to the BR very frequently and passing brown liquid. She is nauseous. No vomiting today. It feels like her stomach is swollen. It is painful when she eats anything.  Pain begins just below her breast bone. She has taken in 64 oz of fluid by 1 pm today. Daughter p/u Zofran tablets today and she took on ~1430. Jessica [name] did not know she was scheduled for a doppler study today at [facility]. R/S it to [facility] for Monday 05-03-18 at 1200 with 1145 arrival. Pt able to bear weight on left leg with no pain. The calf and ankle on left leg is not swollen, red , or warm to the touch. She experiences intermittent throbbing of the left leg,especially at night.

## 2018-04-30 NOTE — Telephone Encounter (Signed)
Discussed patient's recent ER visit and symptoms. She is having unexplained abdominal pain and difficulty passing bowel movements. Minimal emesis, still has flatus. No obstruction on CT scan last night.  I recommended she come in to be evaluated in the office.   She declined because she just took 8 scoops of miralax and is concerned about having diarrhea while in transit.  I provided her with after-hours number to contact if she doesn't feel better after tonight. I informed her that my partner is on call and I have notified my partner about her condition and my concerns. She should call that number if she is not improving and my partner will have her come to be evaluated and admitted.  I am scheduled to see her in the office next week if she improves.  Unexplained leukocytosis on labs, however, no fever.  Normal vitals in ED admission last night.   Etiology is possible constipation, though still high index of suspicion for possible occult visceral injury versus surgical site infection (eg pelvic infection).  Thereasa Solo, MD

## 2018-05-03 ENCOUNTER — Telehealth: Payer: Self-pay | Admitting: *Deleted

## 2018-05-03 ENCOUNTER — Ambulatory Visit (HOSPITAL_COMMUNITY)
Admission: RE | Admit: 2018-05-03 | Payer: Self-pay | Source: Ambulatory Visit | Attending: Emergency Medicine | Admitting: Emergency Medicine

## 2018-05-03 ENCOUNTER — Telehealth: Payer: Self-pay | Admitting: Gynecologic Oncology

## 2018-05-03 ENCOUNTER — Other Ambulatory Visit: Payer: Self-pay

## 2018-05-03 ENCOUNTER — Telehealth: Payer: Self-pay | Admitting: Oncology

## 2018-05-03 ENCOUNTER — Other Ambulatory Visit: Payer: Self-pay | Admitting: Gynecologic Oncology

## 2018-05-03 ENCOUNTER — Ambulatory Visit (HOSPITAL_COMMUNITY)
Admission: RE | Admit: 2018-05-03 | Discharge: 2018-05-03 | Disposition: A | Discharge status: Home / Self Care | Payer: Self-pay | Source: Ambulatory Visit | Attending: Emergency Medicine | Admitting: Emergency Medicine

## 2018-05-03 DIAGNOSIS — M792 Neuralgia and neuritis, unspecified: Secondary | ICD-10-CM

## 2018-05-03 DIAGNOSIS — K5900 Constipation, unspecified: Secondary | ICD-10-CM

## 2018-05-03 DIAGNOSIS — D72829 Elevated white blood cell count, unspecified: Secondary | ICD-10-CM

## 2018-05-03 DIAGNOSIS — M79604 Pain in right leg: Secondary | ICD-10-CM | POA: Insufficient documentation

## 2018-05-03 DIAGNOSIS — C541 Malignant neoplasm of endometrium: Secondary | ICD-10-CM

## 2018-05-03 LAB — COMPREHENSIVE METABOLIC PANEL
ALT: 122 U/L — ABNORMAL HIGH (ref 0–44)
AST: 67 U/L — ABNORMAL HIGH (ref 15–41)
Albumin: 3.3 g/dL — ABNORMAL LOW (ref 3.5–5.0)
Alkaline Phosphatase: 212 U/L — ABNORMAL HIGH (ref 38–126)
Anion gap: 9 (ref 5–15)
BUN: 5 mg/dL — ABNORMAL LOW (ref 6–20)
CO2: 25 mmol/L (ref 22–32)
Calcium: 9.1 mg/dL (ref 8.9–10.3)
Chloride: 104 mmol/L (ref 98–111)
Creatinine, Ser: 0.53 mg/dL (ref 0.44–1.00)
GFR calc Af Amer: >60 mL/min (ref >60)
GFR calc non Af Amer: >60 mL/min (ref >60)
Glucose, Bld: 115 mg/dL — ABNORMAL HIGH (ref 70–99)
Potassium: 3.6 mmol/L (ref 3.5–5.1)
Sodium: 138 mmol/L (ref 135–145)
Total Bilirubin: 0.5 mg/dL (ref 0.3–1.2)
Total Protein: 6.8 g/dL (ref 6.5–8.1)

## 2018-05-03 LAB — CBC WITH DIFFERENTIAL/PLATELET
Abs Immature Granulocytes: 0.04 10*3/uL (ref 0.00–0.07)
Basophils Absolute: 0.1 10*3/uL (ref 0.0–0.1)
Basophils Relative: 1 %
Eosinophils Absolute: 0.2 10*3/uL (ref 0.0–0.5)
Eosinophils Relative: 2 %
HCT: 37.5 % (ref 36.0–46.0)
Hemoglobin: 12.2 g/dL (ref 12.0–15.0)
Immature Granulocytes: 0 %
Lymphocytes Relative: 21 %
Lymphs Abs: 2.3 10*3/uL (ref 0.7–4.0)
MCH: 28.1 pg (ref 26.0–34.0)
MCHC: 32.5 g/dL (ref 30.0–36.0)
MCV: 86.4 fL (ref 80.0–100.0)
Monocytes Absolute: 1 10*3/uL (ref 0.1–1.0)
Monocytes Relative: 10 %
Neutro Abs: 7.2 10*3/uL (ref 1.7–7.7)
Neutrophils Relative %: 66 %
Platelets: 421 10*3/uL — ABNORMAL HIGH (ref 150–400)
RBC: 4.34 MIL/uL (ref 3.87–5.11)
RDW: 12.6 % (ref 11.5–15.5)
WBC: 10.9 10*3/uL — ABNORMAL HIGH (ref 4.0–10.5)
nRBC: 0 % (ref 0.0–0.2)

## 2018-05-03 MED ORDER — SENNOSIDES-DOCUSATE SODIUM 8.6-50 MG PO TABS: 2.0000 | ORAL_TABLET | Freq: Every day | ORAL | 1 refills | Status: DC

## 2018-05-03 MED ORDER — MAGNESIUM HYDROXIDE 400 MG/5ML PO SUSP: 30.0000 mL | Freq: Every day | ORAL | 0 refills | Status: DC | PRN

## 2018-05-03 MED ORDER — GABAPENTIN 100 MG PO CAPS: 100.0000 mg | ORAL_CAPSULE | Freq: Two times a day (BID) | ORAL | 1 refills | Status: DC

## 2018-05-03 NOTE — Telephone Encounter (Signed)
Called [employer] and spoke with a representative about the patient's paperwork.  Will update paperwork and fax back.

## 2018-05-03 NOTE — Telephone Encounter (Signed)
Called to check on patient's status.  She states "I'm ok. My stomach feels much better but my left leg is still throbbing."  States her whole leg is throbbing.  States it started in her calf and went knee down and feels like a bad tooth ache but in her leg.  No swelling in the leg but reports increased warmth. States she is still not going to the bathroom.  Her last reg BM was 2-3 days after surgery.  She has had nothing in the past 3 days and feels she can't even pass gas.  She took 8 capfuls of miralax after her ER visit.  She continues to take stool softeners. States she finally had real food yesterday but had not had solid foods for the past three to four days.  She tolerated the food well yesterday.  She is still planning on going for her doppler this am.  Advised her that I would follow up with her after her doppler to discuss next steps.  Discussed the possibility of genitofemoral nerve irritation post-op if doppler negative with the use of gabapentin.  Advised her that we would also discuss a bowel regimen at that time as well.    Also advised the patient that I reached out to the [facility] group to find out what the concern was with her paperwork and had to leave a message.  Advised that that I would continue to reach out to follow up on the issue.  Called [phone] and [phone] and unable to speak with a representative.

## 2018-05-03 NOTE — Telephone Encounter (Signed)
Called and spoke with the patient, per Dr. Denman George patient needs labs drawn after her doppler. Patient advised to go by [facility] lab department after doppler.   Spoke and prescreened the patient for her appt on Wednesday. Patient has had no signs/symptoms, traveled or exposure. Patient has had no contact with anyone with signs/symptoms, traveled or exposure. Explained that she will be asked these questions again on Wednesday at the front desk along with a temperature check. Also explained the no visitor policy.

## 2018-05-03 NOTE — Telephone Encounter (Signed)
Called [facility] and advised her that her doppler was negative and that the pain may be from genitofemoral nerve irritation from surgery per Joylene John, NP.  She will send in a prescription for gabapentin 100 mg BID which she should start at bedtime in case it causes drowsiness.Discussed not driving if she is feeling drowsy. Also discussed that she can take Ibuprofen for pain.  Asked if she would like a prescription for Senakot and milk of mag for her constipation and she said she will try anything to get her bowels moving.  She has tried over the counter medications and Miralax as directed by the ER doctor without success.  She is having a lot of pain in her rectum and is also using a suppository and will try drinking prune juice.

## 2018-05-03 NOTE — Progress Notes (Signed)
Labs ordered per Dr. Denman George to be performed at [facility] after doppler today.

## 2018-05-03 NOTE — Progress Notes (Signed)
Left lower extremity venous duplex has been completed. Preliminary results can be found in CV Proc through chart review.  Results were given to Joylene John NP.  05/03/18 12:06 PM Carlos Levering RVT

## 2018-05-03 NOTE — Progress Notes (Signed)
See RN note.

## 2018-05-04 NOTE — Telephone Encounter (Signed)
Called Jessica [name] to see how she is feeling today.  She said she is feeling much better.  She started the gabapentin last night and was able to sleep.  She has not had a bowel movement yet and had noticed a small amount of watery diarrhea.  She is taking the laxatives as directed, tried a suppository again and drank a bottle of prune juice.

## 2018-05-05 ENCOUNTER — Inpatient Hospital Stay (HOSPITAL_BASED_OUTPATIENT_CLINIC_OR_DEPARTMENT_OTHER): Payer: Self-pay | Admitting: Gynecologic Oncology

## 2018-05-05 ENCOUNTER — Inpatient Hospital Stay: Payer: Self-pay | Attending: Gynecologic Oncology

## 2018-05-05 ENCOUNTER — Telehealth: Payer: Self-pay | Admitting: Oncology

## 2018-05-05 ENCOUNTER — Encounter: Payer: Self-pay | Admitting: Gynecologic Oncology

## 2018-05-05 ENCOUNTER — Other Ambulatory Visit: Payer: Self-pay

## 2018-05-05 VITALS — BP 130/86 | HR 66 | Temp 97.3°F | Resp 16 | Ht 65.0 in | Wt 224.0 lb

## 2018-05-05 DIAGNOSIS — R945 Abnormal results of liver function studies: Secondary | ICD-10-CM

## 2018-05-05 DIAGNOSIS — Z90710 Acquired absence of both cervix and uterus: Secondary | ICD-10-CM

## 2018-05-05 DIAGNOSIS — Z90722 Acquired absence of ovaries, bilateral: Secondary | ICD-10-CM

## 2018-05-05 DIAGNOSIS — D72829 Elevated white blood cell count, unspecified: Secondary | ICD-10-CM

## 2018-05-05 DIAGNOSIS — Z7189 Other specified counseling: Secondary | ICD-10-CM

## 2018-05-05 DIAGNOSIS — C541 Malignant neoplasm of endometrium: Secondary | ICD-10-CM

## 2018-05-05 DIAGNOSIS — R7989 Other specified abnormal findings of blood chemistry: Secondary | ICD-10-CM | POA: Insufficient documentation

## 2018-05-05 LAB — COMPREHENSIVE METABOLIC PANEL
ALT: 88 U/L — ABNORMAL HIGH (ref 0–44)
AST: 26 U/L (ref 15–41)
Albumin: 3.3 g/dL — ABNORMAL LOW (ref 3.5–5.0)
Alkaline Phosphatase: 210 U/L — ABNORMAL HIGH (ref 38–126)
Anion gap: 12 (ref 5–15)
BUN: 8 mg/dL (ref 6–20)
CO2: 25 mmol/L (ref 22–32)
Calcium: 9.6 mg/dL (ref 8.9–10.3)
Chloride: 104 mmol/L (ref 98–111)
Creatinine, Ser: 0.65 mg/dL (ref 0.44–1.00)
GFR calc Af Amer: >60 mL/min (ref >60)
GFR calc non Af Amer: >60 mL/min (ref >60)
Glucose, Bld: 98 mg/dL (ref 70–99)
Potassium: 4.3 mmol/L (ref 3.5–5.1)
Sodium: 141 mmol/L (ref 135–145)
Total Bilirubin: 0.2 mg/dL — ABNORMAL LOW (ref 0.3–1.2)
Total Protein: 7.7 g/dL (ref 6.5–8.1)

## 2018-05-05 NOTE — Progress Notes (Signed)
Follow-up Note: Gyn-Onc  Consult was requested by Dr. Kennon Rounds for the evaluation of Jessica [name] 58 y.o. female  CC:  Chief Complaint  Patient presents with  . Endometrial cancer ([place])  . Elevated LFTs    Assessment/Plan:  [name]  is a 58 y.o.  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   Despite the high grade lesion, the patient meets criteria for low risk factors for recurrence (no myometrial invasion), therefore no adjuvant therapy is recommended according to NCCN guidelines.  I discussed risk for recurrence and typical symptoms encouraged her to notify us of these should they develop between visits.  I recommend she have follow-up every 6 months for 5 years in accordance with NCCN guidelines. Those visits should include symptom assessment, physical exam and pelvic examination. Pap smears are not indicated or recommended in the routine surveillance of endometrial cancer.  We will recheck LFT's today.  I feel that her rectal pressure is secondary to some cuff edema.   Continue gabapentin for her lower extremity nerve pain.   HPI: [name] is a 58 year old P3 who is seen in consultation at the request of Dr Kennon Rounds for uterine carcinosarcoma.  The patient reports 64-month history of vaginal spotting which became more consistent with menstrual-like bleeding in January 2020.  She then presented to the emergency room at [facility] where a physical examination was performed which revealed a cervical mass, on March 24, 2018.  She then had a transvaginal ultrasound scan on March 24, 2018 which revealed a uterus measuring 7.1 x 3.3 x 4.9 cm with no gross masses within the uterus identified, the endometrium was thickened measuring 14 mm but with no focal abnormality, the right and left ovaries were not visualized, there was a trace amount of free pelvic fluid.  Due to the finding of a thickened endometrium and the cervical mass  on examination she was sent for evaluation by Dr. Kennon Rounds.  On examination on March 29, 2026 Dr. Kennon Rounds identified that the entire cervix was replaced with a polypoid mass with multiple projections.  She took biopsies from this mass which revealed carcinosarcoma.  The patient does not seek medical care frequently and does not carry medical diagnoses.  She is obese with a BMI of 38 kg/m.  Her father died from lung cancer but was a smoker.  She also has a maternal aunt who died from metastatic carcinoma but she is unknown of what primary diagnosis she had.  The patient has had no prior abdominal surgeries only prior neck surgery.  She had 3 pregnancies which were all vaginal deliveries.  Recent Hemoccult testing of her stool was positive and she has a strong urology consult scheduled for April 10, 2018.  She is never had a colonoscopy.  She works in Copy at [employer].  Interval Hx:  On 04/13/18 she underwent robotic assisted total hysterectomy, BSO, pelvic and para-aortic lymphadenectomy. Final pathology revealed a 5.2 cm polypoid carcinosarcoma tumor with no myometrial invasion, no LVSI, negative lymph nodes, cervix and adnexa. Due to the non-invasive nature of her disease, she was determined to have low risk factors for recurrence and in accordance with NCCN guidelines, no adjuvant therapy was recommended.  She initially did well postop, but then developed increasing abdominal pain and constipation at 2 weeks postop. She had sensation of incomplete fecal evacuation.  She was seen in the ED on 04/29/18 where a CT  abd/pelvis was performed which showed no bowel obstruction. Liquid and semi formed stool throughout the colon without rectal distension or significant increased stool burden. No evidence of bowel obstruction. Short segment small bowel wall thickening in the central abdomen with mild adjacent edema that is nonspecific, may be persistent irritation from recent abdominal  surgery, or infectious enteritis. Post recent hysterectomy with pelvic and periaortic lymph node dissection. Expected stranding and small amount of free fluid in the hysterectomy operative bed. There are 2 ovoid fluid density structures each measuring approximately 3 cm without peripheral enhancement, 1 to the right of the IVC at the iliac bifurcation, and at the left external iliac station. These likely represent postoperative seromas, however technically indeterminate in regards to sterility by CT. Incidental gallstones. Her CBC at that time showed an elevated WBC of 16,000. She was afebrile.  She was treated with miralax and had improvement of symptoms. A repeat CBC on 05/03/18 showed the WBC had normalized to 10,000.  She has demonstrated an unexplained transaminitis postop.   She has began experiencing left lower extremity pain which was worked up with doppler on 05/03/18 which was negative for DVT. She was empirically started on neurontin.   Current Meds:  Outpatient Encounter Medications as of 05/05/2018  Medication Sig  . acetaminophen (TYLENOL) 500 MG tablet Take 1,000 mg by mouth every 6 (six) hours as needed for mild pain.  Marland Kitchen amLODipine (NORVASC) 5 MG tablet Take 1 tablet (5 mg total) by mouth daily.  Marland Kitchen atorvastatin (LIPITOR) 20 MG tablet Take 1 tablet (20 mg total) by mouth daily.  Marland Kitchen gabapentin (NEURONTIN) 100 MG capsule Take 1 capsule (100 mg total) by mouth 2 (two) times daily. Do not take and drive if drowsiness occurs  . ibuprofen (ADVIL,MOTRIN) 600 MG tablet Take 1 tablet (600 mg total) by mouth every 6 (six) hours as needed for moderate pain. For AFTER surgery  . ondansetron (ZOFRAN ODT) 4 MG disintegrating tablet 4mg  ODT q4 hours prn nausea/vomit  . polyethylene glycol (MIRALAX / GLYCOLAX) 17 g packet Take 17 g by mouth 2 (two) times daily as needed.  . senna-docusate (SENOKOT-S) 8.6-50 MG tablet Take 2 tablets by mouth at bedtime. For AFTER surgery, do not take if having loose  stools  . Multiple Vitamins-Minerals (WOMENS MULTIVITAMIN) TABS Take 1 tablet by mouth daily.  . [DISCONTINUED] magnesium hydroxide (MILK OF MAGNESIA) 400 MG/5ML suspension Take 30 mLs by mouth daily as needed for mild constipation.   No facility-administered encounter medications on file as of 05/05/2018.     Allergy: No Known Allergies  Social Hx:   Social History   Socioeconomic History  . Marital status: Widowed    Spouse name: Not on file  . Number of children: Not on file  . Years of education: Not on file  . Highest education level: Not on file  Occupational History  . Not on file  Social Needs  . Financial resource strain: Not on file  . Food insecurity:    Worry: Never true    Inability: Never true  . Transportation needs:    Medical: No    Non-medical: No  Tobacco Use  . Smoking status: Never Smoker  . Smokeless tobacco: Never Used  Substance and Sexual Activity  . Alcohol use: Yes    Alcohol/week: 4.0 standard drinks    Types: 4 Glasses of wine per week    Comment: weekends  . Drug use: Never  . Sexual activity: Not Currently  Lifestyle  . Physical  activity:    Days per week: Not on file    Minutes per session: Not on file  . Stress: Not on file  Relationships  . Social connections:    Talks on phone: Not on file    Gets together: Not on file    Attends religious service: Not on file    Active member of club or organization: Not on file    Attends meetings of clubs or organizations: Not on file    Relationship status: Not on file  . Intimate partner violence:    Fear of current or ex partner: Not on file    Emotionally abused: Not on file    Physically abused: Not on file    Forced sexual activity: Not on file  Other Topics Concern  . Not on file  Social History Narrative  . Not on file    Past Surgical Hx:  Past Surgical History:  Procedure Laterality Date  . NECK SURGERY  2000   spinal surgery , titanium rod in place   . ROBOTIC ASSISTED  TOTAL HYSTERECTOMY WITH BILATERAL SALPINGO OOPHERECTOMY N/A 04/13/2018   Procedure: XI ROBOTIC ASSISTED TOTAL HYSTERECTOMY WITH BILATERAL SALPINGO OOPHORECTOMY;  Surgeon: Everitt Amber, MD;  Location: [facility];  Service: Gynecology;  Laterality: N/A;  . ROBOTIC PELVIC AND PARA-AORTIC LYMPH NODE DISSECTION N/A 04/13/2018   Procedure: XI ROBOTIC PELVIC LYMPHADECTOMY AND PARA-AORTIC LYMPH NODE DISSECTION;  Surgeon: Everitt Amber, MD;  Location: [facility];  Service: Gynecology;  Laterality: N/A;    Past Medical Hx:  Past Medical History:  Diagnosis Date  . Elevated blood pressure reading   . Endometrial cancer ([place])   . PMB (postmenopausal bleeding)     Past Gynecological History:  P3 (svd's) No LMP recorded. Patient is postmenopausal.  Family Hx:  Family History  Problem Relation Age of Onset  . Hypertension Father   . Heart disease Father   . Cancer Father        lung  . Hypertension Mother     Review of Systems:  Constitutional  Feels well,   ENT Normal appearing ears and nares bilaterally Skin/Breast  No rash, sores, jaundice, itching, dryness Cardiovascular  No chest pain, shortness of breath, or edema  Pulmonary  No cough or wheeze.  Gastro Intestinal  No nausea, vomitting, or diarrhoea. No bright red blood per rectum, no abdominal pain, change in bowel movement, or constipation.  Genito Urinary  No frequency, urgency, dysuria, no bleeding Musculo Skeletal  + left thigh and leg pain (calf) Neurologic  No weakness, numbness, change in gait,  Psychology  No depression, anxiety, insomnia.   Vitals:  Blood pressure 130/86, pulse 66, temperature (!) 97.3 F (36.3 C), temperature source Oral, resp. rate 16, height 5\' 5"  (1.651 m), weight 224 lb (101.6 kg), SpO2 98 %.  Physical Exam: WD in NAD Neck  Supple NROM, without any enlargements.  Lymph Node Survey No cervical supraclavicular or inguinal adenopathy Cardiovascular  Pulse normal rate, regularity and rhythm. S1 and S2  normal.  Lungs  Clear to auscultation bilateraly, without wheezes/crackles/rhonchi. Good air movement.  Skin  No rash/lesions/breakdown  Psychiatry  Alert and oriented to person, place, and time  Abdomen  Normoactive bowel sounds, abdomen soft, non-tender and obese without evidence of hernia. Incisions well healed.  Back No CVA tenderness Genito Urinary:  Vaginal cuff in tact, not bleeding, there is induration on the left cuff abutting the rectum consistent with edema. Rectal  No stool. There is an indurated cuff  palpable.  Extremities  No edema   30 minutes of direct face to face counseling time was spent with the patient. This included discussion about prognosis, therapy recommendations and postoperative side effects and are beyond the scope of routine postoperative care.   Thereasa Solo, MD  05/05/2018, 1:01 PM

## 2018-05-05 NOTE — Telephone Encounter (Signed)
Left a message for Jessica [name] regarding her lab results.  Will call again.

## 2018-05-05 NOTE — Addendum Note (Signed)
Addended by: Joylene John D on: 05/05/2018 01:33 PM   Modules accepted: Orders

## 2018-05-05 NOTE — Patient Instructions (Signed)
Please contact Dr Serita Grit office (at [phone]) in July, 2020 to request an appointment with her for October. Please notify Dr Denman George at phone number [phone] if you notice vaginal bleeding, new pelvic or abdominal pains, bloating, feeling full easy, or a change in bladder or bowel function.

## 2018-05-06 NOTE — Telephone Encounter (Signed)
Left another message for Jessica [name] regarding her lab results.  Advised her that they are better and to continue avoiding tylenol and alcohol.

## 2018-05-17 ENCOUNTER — Telehealth: Payer: Self-pay | Admitting: *Deleted

## 2018-05-17 ENCOUNTER — Encounter: Payer: Self-pay | Admitting: Gynecologic Oncology

## 2018-05-17 NOTE — Telephone Encounter (Signed)
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  Faxed records to [employer]

## 2018-05-18 ENCOUNTER — Telehealth: Payer: Self-pay

## 2018-05-18 NOTE — Telephone Encounter (Signed)
Told Jessica [name] that the activity restrictions began the day of surgery 04-13-18 per Joylene John, NP. The surgery was out patient and she went home the same day as well.

## 2018-05-25 ENCOUNTER — Telehealth: Payer: Self-pay | Admitting: Gynecologic Oncology

## 2018-05-25 ENCOUNTER — Telehealth: Payer: Self-pay | Admitting: *Deleted

## 2018-05-25 DIAGNOSIS — M25559 Pain in unspecified hip: Secondary | ICD-10-CM

## 2018-05-25 DIAGNOSIS — K6289 Other specified diseases of anus and rectum: Secondary | ICD-10-CM

## 2018-05-25 MED ORDER — HYDROCORTISONE 2.5 % RE CREA: 1.0000 "application " | TOPICAL_CREAM | Freq: Two times a day (BID) | RECTAL | 0 refills | Status: DC

## 2018-05-25 NOTE — Telephone Encounter (Signed)
Patient called and left a message stating "I'm still having a real bad issue with going to the bathroom. I out my hand down there and it feels like there is something down there that not suppose to be." Message forwarded to [facility] APP

## 2018-05-25 NOTE — Telephone Encounter (Signed)
Called the patient back per Melissa APP and gave the appt information for the CT tomorrow. Patient's appt tomorrow at 9am at [facility], patient going by there today to pick up contrast

## 2018-05-25 NOTE — Telephone Encounter (Signed)
Returned call to patient.  Patient stating she is continuing to have severe lower pelvic pain mainly in her vaginal and rectal area.  Describes the discomfort as shooting pains and extreme pressure. States she is still having difficulty having bowel movements. She has been taking miralax and gas x and states her rectum feels very tense and tight and it hurts to pass flatus. She states she can't sleep, can't stand due to the discomfort. She feels her bottom is so sore and tense. Discussed with Dr. Denman George. Plan for CT scan of the abdomen and pelvis since symptoms have continued to see if there is anything new from her previous one from one month ago.  Patient also advised we will probably arrange for her to come in the office for a pelvic exam if CT scan normal. Patient verbalizing understanding. Patient will be contacted with additional information regarding her scan.

## 2018-05-25 NOTE — Telephone Encounter (Signed)
Called patient back and informed her that a prescription for Anusol had been sent to her pharmacy to see if that alleviated any of her rectal/anal pressure/pain symptoms. Advised we would contact her with the results of her CT scan.

## 2018-05-26 ENCOUNTER — Telehealth: Payer: Self-pay | Admitting: Gynecologic Oncology

## 2018-05-26 ENCOUNTER — Ambulatory Visit (HOSPITAL_COMMUNITY)
Admission: RE | Admit: 2018-05-26 | Discharge: 2018-05-26 | Disposition: A | Discharge status: Home / Self Care | Payer: Self-pay | Source: Ambulatory Visit | Attending: Gynecologic Oncology | Admitting: Gynecologic Oncology

## 2018-05-26 ENCOUNTER — Other Ambulatory Visit: Payer: Self-pay

## 2018-05-26 DIAGNOSIS — M25559 Pain in unspecified hip: Secondary | ICD-10-CM | POA: Insufficient documentation

## 2018-05-26 DIAGNOSIS — R188 Other ascites: Secondary | ICD-10-CM

## 2018-05-26 MED ORDER — IOHEXOL 300 MG/ML  SOLN
100.0000 mL | Freq: Once | INTRAMUSCULAR | Status: AC | PRN
  Administered 2018-05-26: 09:00:00 100 mL via INTRAVENOUS

## 2018-05-26 NOTE — Telephone Encounter (Signed)
Patient informed of CT scan results along with Dr. Serita Grit recommendations for IR consultation for drainage. Advised patient that order would be placed ASAP. Patient stating she is feeling poorly this am.

## 2018-05-27 ENCOUNTER — Other Ambulatory Visit: Payer: Self-pay | Admitting: Student

## 2018-05-27 ENCOUNTER — Telehealth: Payer: Self-pay | Admitting: Oncology

## 2018-05-27 NOTE — Telephone Encounter (Signed)
Jessica [name] called and was informed of appointment tomorrow for CT guided drainage with arrival at 7:15 am, NPO after midnight.  She verbalized understanding and agreement.

## 2018-05-27 NOTE — Telephone Encounter (Signed)
Left a message for Jessica [name] regarding appointment for CT guided drainage that has been rescheduled for tomorrow.  Requested a return call.

## 2018-05-28 ENCOUNTER — Encounter (HOSPITAL_COMMUNITY): Payer: Self-pay

## 2018-05-28 ENCOUNTER — Ambulatory Visit (HOSPITAL_COMMUNITY)
Admission: RE | Admit: 2018-05-28 | Discharge: 2018-05-28 | Disposition: A | Discharge status: Home / Self Care | Payer: Self-pay | Source: Ambulatory Visit | Attending: Gynecologic Oncology | Admitting: Gynecologic Oncology

## 2018-05-28 ENCOUNTER — Other Ambulatory Visit: Payer: Self-pay | Admitting: Student

## 2018-05-28 ENCOUNTER — Ambulatory Visit (HOSPITAL_COMMUNITY)
Admission: RE | Admit: 2018-05-28 | Discharge: 2018-05-28 | Disposition: A | Discharge status: Home / Self Care | Payer: Self-pay | Source: Ambulatory Visit | Attending: Interventional Radiology | Admitting: Interventional Radiology

## 2018-05-28 ENCOUNTER — Other Ambulatory Visit: Payer: Self-pay

## 2018-05-28 ENCOUNTER — Other Ambulatory Visit (HOSPITAL_COMMUNITY): Payer: Self-pay | Admitting: Interventional Radiology

## 2018-05-28 VITALS — BP 126/77 | HR 47 | Temp 97.4°F | Resp 14

## 2018-05-28 DIAGNOSIS — C541 Malignant neoplasm of endometrium: Secondary | ICD-10-CM

## 2018-05-28 DIAGNOSIS — T8149XA Infection following a procedure, other surgical site, initial encounter: Secondary | ICD-10-CM

## 2018-05-28 DIAGNOSIS — R188 Other ascites: Secondary | ICD-10-CM | POA: Insufficient documentation

## 2018-05-28 LAB — CBC
HCT: 40.7 % (ref 36.0–46.0)
Hemoglobin: 12.9 g/dL (ref 12.0–15.0)
MCH: 28.1 pg (ref 26.0–34.0)
MCHC: 31.7 g/dL (ref 30.0–36.0)
MCV: 88.7 fL (ref 80.0–100.0)
Platelets: 383 10*3/uL (ref 150–400)
RBC: 4.59 MIL/uL (ref 3.87–5.11)
RDW: 13 % (ref 11.5–15.5)
WBC: 8 10*3/uL (ref 4.0–10.5)
nRBC: 0 % (ref 0.0–0.2)

## 2018-05-28 LAB — PROTIME-INR
INR: 1 (ref 0.8–1.2)
Prothrombin Time: 13 seconds (ref 11.4–15.2)

## 2018-05-28 LAB — APTT: aPTT: 30 seconds (ref 24–36)

## 2018-05-28 MED ORDER — KETOROLAC TROMETHAMINE 30 MG/ML IJ SOLN
INTRAMUSCULAR | Status: AC
  Filled 2018-05-28: qty 1

## 2018-05-28 MED ORDER — NALOXONE HCL 0.4 MG/ML IJ SOLN
INTRAMUSCULAR | Status: AC
  Filled 2018-05-28: qty 1

## 2018-05-28 MED ORDER — HYDROCODONE-ACETAMINOPHEN 5-325 MG PO TABS: 1.0000 | ORAL_TABLET | ORAL | Status: DC | PRN

## 2018-05-28 MED ORDER — KETOROLAC TROMETHAMINE 30 MG/ML IJ SOLN
30.0000 mg | Freq: Once | INTRAMUSCULAR | Status: AC
  Administered 2018-05-28: 11:00:00 30 mg via INTRAMUSCULAR

## 2018-05-28 MED ORDER — FENTANYL CITRATE (PF) 100 MCG/2ML IJ SOLN
INTRAMUSCULAR | Status: AC
  Filled 2018-05-28: qty 2

## 2018-05-28 MED ORDER — MIDAZOLAM HCL 2 MG/2ML IJ SOLN
INTRAMUSCULAR | Status: AC
  Filled 2018-05-28: qty 4

## 2018-05-28 MED ORDER — SODIUM CHLORIDE 0.9 % IV SOLN
INTRAVENOUS | Status: DC
  Administered 2018-05-28: 08:00:00 via INTRAVENOUS

## 2018-05-28 MED ORDER — SODIUM CHLORIDE 0.9 % IV SOLN: 1.0000 g | Freq: Once | INTRAVENOUS | Status: DC

## 2018-05-28 MED ORDER — MIDAZOLAM HCL 2 MG/2ML IJ SOLN
INTRAMUSCULAR | Status: AC
  Filled 2018-05-28: qty 2

## 2018-05-28 MED ORDER — LIDOCAINE HCL 1 % IJ SOLN
INTRAMUSCULAR | Status: AC
  Filled 2018-05-28: qty 20

## 2018-05-28 MED ORDER — MIDAZOLAM HCL 2 MG/2ML IJ SOLN
INTRAMUSCULAR | Status: AC | PRN
  Administered 2018-05-28 (×5): 1 mg via INTRAVENOUS

## 2018-05-28 MED ORDER — LIDOCAINE HCL (PF) 1 % IJ SOLN
INTRAMUSCULAR | Status: AC | PRN
  Administered 2018-05-28: 10 mL

## 2018-05-28 MED ORDER — FENTANYL CITRATE (PF) 100 MCG/2ML IJ SOLN
INTRAMUSCULAR | Status: AC | PRN
  Administered 2018-05-28 (×2): 50 ug via INTRAVENOUS

## 2018-05-28 MED ORDER — SODIUM CHLORIDE 0.9 % IV SOLN
2.0000 g | Freq: Once | INTRAVENOUS | Status: AC
  Administered 2018-05-28: 2 g via INTRAVENOUS
  Filled 2018-05-28: qty 2

## 2018-05-28 MED ORDER — FLUMAZENIL 0.5 MG/5ML IV SOLN
INTRAVENOUS | Status: AC
  Filled 2018-05-28: qty 5

## 2018-05-28 MED FILL — ?AMLODIPINE BESYLATE 5MG TA: 5 | 30 days supply | Qty: 30 | Fill #1

## 2018-05-28 MED FILL — ?ATORVASTATIN 20 MG TABLET: 20 | 30 days supply | Qty: 30 | Fill #1

## 2018-05-28 NOTE — H&P (Signed)
Chief Complaint: Patient was seen in consultation today for pelvic fluid collections  Referring Physician(s): Dr. Everitt Amber  Supervising Physician: Jacqulynn Cadet  Patient Status: Jessica [name] - Out-pt  History of Present Illness: Jessica [name] is a 58 y.o. female with past medical history of endometrial cancer who underwent total hysterectomy with bilateral salpingo oophrectomy 04/13/18.  Patient states that since surgery she has had pelvic pain and pressure with constipation and difficulty urinating. Denies fever, chills, abdominal pain, nausea, vomiting.  Main complaint is pelvic pressure with inability to void completely.   Patient referred to IR for aspiration and drainage of her pelvic fluid collection.  Case reviewed and approved by Dr. Annamaria Boots.  Patient presents today in stable condition. Her Tmax pre-procedure is 71F.  Case reviewed with Dr. Laurence Ferrari who agrees to proceed with trans-vaginal pelvic fluid collection.    Past Medical History:  Diagnosis Date   Elevated blood pressure reading    Endometrial cancer ([place])    PMB (postmenopausal bleeding)     Past Surgical History:  Procedure Laterality Date   NECK SURGERY  2000   spinal surgery , titanium rod in place    ROBOTIC ASSISTED TOTAL HYSTERECTOMY WITH BILATERAL SALPINGO OOPHERECTOMY N/A 04/13/2018   Procedure: XI ROBOTIC ASSISTED TOTAL HYSTERECTOMY WITH BILATERAL SALPINGO OOPHORECTOMY;  Surgeon: Everitt Amber, MD;  Location: [facility];  Service: Gynecology;  Laterality: N/A;   ROBOTIC PELVIC AND PARA-AORTIC LYMPH NODE DISSECTION N/A 04/13/2018   Procedure: XI ROBOTIC PELVIC LYMPHADECTOMY AND PARA-AORTIC LYMPH NODE DISSECTION;  Surgeon: Everitt Amber, MD;  Location: [facility];  Service: Gynecology;  Laterality: N/A;    Allergies: Patient has no known allergies.  Medications: Prior to Admission medications   Medication Sig Start Date End Date Taking? Authorizing Provider  acetaminophen (TYLENOL) 500 MG tablet Take  1,000 mg by mouth every 6 (six) hours as needed for mild pain.    [provider]  amLODipine (NORVASC) 5 MG tablet Take 1 tablet (5 mg total) by mouth daily. 04/26/18   Elsie Stain, MD  atorvastatin (LIPITOR) 20 MG tablet Take 1 tablet (20 mg total) by mouth daily. 04/27/18   Elsie Stain, MD  diphenhydramine-acetaminophen (TYLENOL PM) 25-500 MG TABS tablet Take 1 tablet by mouth at bedtime as needed.    [provider]  gabapentin (NEURONTIN) 100 MG capsule Take 1 capsule (100 mg total) by mouth 2 (two) times daily. Do not take and drive if drowsiness occurs 05/03/18   Cross, Lenna Sciara D, NP  hydrocortisone (ANUSOL-HC) 2.5 % rectal cream Place 1 application rectally 2 (two) times daily. 05/25/18   Cross, Lenna Sciara D, NP  ibuprofen (ADVIL,MOTRIN) 600 MG tablet Take 1 tablet (600 mg total) by mouth every 6 (six) hours as needed for moderate pain. For AFTER surgery 03/31/18   Dorothyann Gibbs, NP  Multiple Vitamins-Minerals (WOMENS MULTIVITAMIN) TABS Take 1 tablet by mouth daily.    [provider]  ondansetron (ZOFRAN ODT) 4 MG disintegrating tablet 96m ODT q4 hours prn nausea/vomit 04/29/18   FDeno Etienne DO  polyethylene glycol (MIRALAX / GLYCOLAX) 17 g packet Take 17 g by mouth 2 (two) times daily as needed.    [provider]  senna-docusate (SENOKOT-S) 8.6-50 MG tablet Take 2 tablets by mouth at bedtime. For AFTER surgery, do not take if having loose stools 05/03/18   CJoylene JohnD, NP     Family History  Problem Relation Age of Onset   Hypertension Father    Heart disease Father  Cancer Father        lung   Hypertension Mother     Social History   Socioeconomic History   Marital status: Widowed    Spouse name: Not on file   Number of children: Not on file   Years of education: Not on file   Highest education level: Not on file  Occupational History   Not on file  Social Needs   Financial resource strain: Not on file   Food  insecurity:    Worry: Never true    Inability: Never true   Transportation needs:    Medical: No    Non-medical: No  Tobacco Use   Smoking status: Never Smoker   Smokeless tobacco: Never Used  Substance and Sexual Activity   Alcohol use: Yes    Alcohol/week: 4.0 standard drinks    Types: 4 Glasses of wine per week    Comment: weekends   Drug use: Never   Sexual activity: Not Currently  Lifestyle   Physical activity:    Days per week: Not on file    Minutes per session: Not on file   Stress: Not on file  Relationships   Social connections:    Talks on phone: Not on file    Gets together: Not on file    Attends religious service: Not on file    Active member of club or organization: Not on file    Attends meetings of clubs or organizations: Not on file    Relationship status: Not on file  Other Topics Concern   Not on file  Social History Narrative   Not on file     Review of Systems: A 12 point ROS discussed and pertinent positives are indicated in the HPI above.  All other systems are negative.  Review of Systems  Vital Signs: There were no vitals taken for this visit.  Physical Exam   MD Evaluation Airway: WNL Heart: WNL Abdomen: WNL Chest/ Lungs: WNL ASA  Classification: 3 Mallampati/Airway Score: One   Imaging: Ct Abdomen Pelvis W Contrast  Result Date: 05/26/2018 CLINICAL DATA:  Persistent severe lower abdominal and pelvic pain. Constipation. 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 EXAM: CT ABDOMEN AND PELVIS WITH CONTRAST TECHNIQUE: Multidetector CT imaging of the abdomen and pelvis was performed using the standard protocol following bolus administration of intravenous contrast. CONTRAST:  148m OMNIPAQUE IOHEXOL 300 MG/ML  SOLN COMPARISON:  04/29/2018 FINDINGS: Lower Chest: No acute findings. Hepatobiliary: No hepatic masses identified. Gallstone again noted, however there is no evidence of cholecystitis or biliary  dilatation. Pancreas:  No mass or inflammatory changes. Spleen: Within normal limits in size and appearance. Adrenals/Urinary Tract: No masses identified. No evidence of hydronephrosis. Unremarkable unopacified urinary bladder. Stomach/Bowel: Small hiatal hernia again noted. No evidence of bowel obstruction. Increased wall thickening of the rectosigmoid colon, consistent with colitis. No definite diverticular disease seen. Vascular/Lymphatic: No pathologically enlarged lymph nodes. No abdominal aortic aneurysm. Reproductive: Prior hysterectomy. New rim enhancing fluid collection and adjacent inflammatory changes are seen in the hysterectomy bed, measuring 5.9 x 4.9 cm. This is suspicious for abscess. Other: Mild retroperitoneal soft tissue stranding surrounding the distal aorta and iliac vessels has decreased since previous study. Musculoskeletal:  No suspicious bone lesions identified. IMPRESSION: 1. New 5.9 cm rim enhancing fluid collection and adjacent inflammatory changes in hysterectomy bed, suspicious for abscess. 2. Increased wall thickening of rectosigmoid colon, consistent with colitis and likely secondary to adjacent abscess described above. 3. Decreased retroperitoneal soft  tissue stranding surrounding distal aorta and iliac vessels. 4. Cholelithiasis. No radiographic evidence of cholecystitis. 5. Small hiatal hernia. These results will be called to the ordering clinician or representative by the Radiologist Assistant, and communication documented in the PACS or zVision Dashboard. Electronically Signed   By: Earle Gell M.D.   On: 05/26/2018 09:51   Ct Abdomen Pelvis W Contrast  Result Date: 04/29/2018 CLINICAL DATA:  Bowel obstruction. Post hysterectomy 2 weeks ago with left lower extremity pain and constipation. Patient reports last bowel movement 10 days ago. EXAM: CT ABDOMEN AND PELVIS WITH CONTRAST TECHNIQUE: Multidetector CT imaging of the abdomen and pelvis was performed using the standard  protocol following bolus administration of intravenous contrast. CONTRAST:  141m OMNIPAQUE IOHEXOL 300 MG/ML  SOLN COMPARISON:  CT 04/12/2018 FINDINGS: Lower chest: Compressive atelectasis in the medial right lower lobe adjacent to osteophytes. No pleural fluid, consolidation, or pulmonary nodule. Hepatobiliary: No focal hepatic abnormality. Again seen gallstones without pericholecystic inflammation. No biliary dilatation. Small amount of perihepatic fluid measures simple fluid density. Pancreas: No ductal dilatation or inflammation. Spleen: Normal in size without focal abnormality. Adrenals/Urinary Tract: Normal adrenal glands. No hydronephrosis or perinephric edema. Homogeneous renal enhancement with symmetric excretion on delayed phase imaging. Urinary bladder is partially distended. Mild bladder wall thickening. Stomach/Bowel: Bowel evaluation is limited in the absence of enteric contrast. Small hiatal hernia. Stomach is nondistended. No small bowel obstruction or abnormal distention. Short segment of small bowel wall thickening in the central abdomen, images 45-50 series 2, nonspecific. Liquid and semi formed stool throughout the colon without colonic inflammation. No abnormal rectal distention. Appendix tentatively identified and normal. Vascular/Lymphatic: Mild aortic atherosclerosis without aneurysm. Mild retroperitoneal edema in the lower abdomen and pelvis likely related to prior lymph node dissection. There is a 2.3 x 2.0 cm oval low-density structure measuring simple fluid in the right common iliac station just at the iliac bifurcation and to the right of the IVC. Oval 3.0 x 2.0 cm fluid density structure in the left external iliac station lateral to the vessels. No peripheral enhancement or internal air. Reproductive: Post hysterectomy. Generalized stranding in the pelvis and operative bed without focal fluid collection or abscess. Small amount of free fluid. Other: Scattered air in the left anterior  abdominal wall likely sequela of recent surgery. No abdominal wall fluid collection. No free intra-abdominal air. Minimal pelvic free fluid. No organized pelvic fluid collection. Musculoskeletal: There are no acute or suspicious osseous abnormalities. IMPRESSION: 1. No bowel obstruction. Liquid and semi formed stool throughout the colon without rectal distension or significant increased stool burden. No evidence of bowel obstruction. Short segment small bowel wall thickening in the central abdomen with mild adjacent edema that is nonspecific, may be persistent irritation from recent abdominal surgery, or infectious enteritis. 2. Post recent hysterectomy with pelvic and periaortic lymph node dissection. Expected stranding and small amount of free fluid in the hysterectomy operative bed. There are 2 ovoid fluid density structures each measuring approximately 3 cm without peripheral enhancement, 1 to the right of the IVC at the iliac bifurcation, and 1 at the left external iliac station. These likely represent postoperative seromas, however technically indeterminate in regards to sterility by CT. 3. Incidental gallstones. Electronically Signed   By: MKeith RakeM.D.   On: 04/29/2018 21:03   Vas UKoreaLower Extremity Venous (dvt)  Result Date: 05/03/2018  Lower Venous Study Indications: Pain.  Performing Technologist: GOliver HumRVT  Examination Guidelines: A complete evaluation includes B-mode imaging, spectral Doppler,  color Doppler, and power Doppler as needed of all accessible portions of each vessel. Bilateral testing is considered an integral part of a complete examination. Limited examinations for reoccurring indications may be performed as noted.  Right Venous Findings: +---+---------------+---------+-----------+----------+-------+      Compressibility Phasicity Spontaneity Properties Summary  +---+---------------+---------+-----------+----------+-------+  CFV Full            Yes       Yes                              +---+---------------+---------+-----------+----------+-------+  Left Venous Findings: +---------+---------------+---------+-----------+----------+-------+            Compressibility Phasicity Spontaneity Properties Summary  +---------+---------------+---------+-----------+----------+-------+  CFV       Full            Yes       Yes                             +---------+---------------+---------+-----------+----------+-------+  SFJ       Full                                                      +---------+---------------+---------+-----------+----------+-------+  FV Prox   Full                                                      +---------+---------------+---------+-----------+----------+-------+  FV Mid    Full                                                      +---------+---------------+---------+-----------+----------+-------+  FV Distal Full                                                      +---------+---------------+---------+-----------+----------+-------+  PFV       Full                                                      +---------+---------------+---------+-----------+----------+-------+  POP       Full            Yes       Yes                             +---------+---------------+---------+-----------+----------+-------+  PTV       Full                                                      +---------+---------------+---------+-----------+----------+-------+  PERO      Full                                                      +---------+---------------+---------+-----------+----------+-------+    Summary: Right: No evidence of common femoral vein obstruction. Left: There is no evidence of deep vein thrombosis in the lower extremity. No cystic structure found in the popliteal fossa.  *See table(s) above for measurements and observations. Electronically signed by Monica Martinez MD on 05/03/2018 at 5:17:35 PM.    Final     Labs:  CBC: Recent Labs    04/05/18 0814 04/29/18 1932  05/03/18 1215 05/28/18 0722  WBC 6.3 17.0* 10.9* 8.0  HGB 13.6 14.2 12.2 12.9  HCT 43.1 43.2 37.5 40.7  PLT 319 361 421* 383    COAGS: Recent Labs    05/28/18 0722  INR 1.0  APTT 30    BMP: Recent Labs    04/26/18 1027 04/29/18 1932 05/03/18 1215 05/05/18 1312  NA 141 137 138 141  K 4.5 4.1 3.6 4.3  CL 104 104 104 104  CO2 21 20* 25 25  GLUCOSE 94 97 115* 98  BUN 13 8 5* 8  CALCIUM 8.7 9.2 9.1 9.6  CREATININE 0.56* 0.43* 0.53 0.65  GFRNONAA 105 >60 >60 >60  GFRAA 121 >60 >60 >60    LIVER FUNCTION TESTS: Recent Labs    04/26/18 1027 04/29/18 1932 05/03/18 1215 05/05/18 1312  BILITOT 0.3 0.9 0.5 0.2*  AST 35 80* 67* 26  ALT 55* 115* 122* 88*  ALKPHOS 118* 180* 212* 210*  PROT 6.4 8.0 6.8 7.7  ALBUMIN 4.2 4.0 3.3* 3.3*    TUMOR MARKERS: No results for input(s): AFPTM, CEA, CA199, CHROMGRNA in the last 8760 hours.  Assessment and Plan: Patient with past medical history of endometrial cancer s/p hysterectomy presents with complaint of pelvic fluid collection.  IR consulted for aspiration and drainage at the request of Dr. Denman George. Case reviewed by Dr. Annamaria Boots who approves patient for procedure.  Patient presents today in their usual state of health. She denies fevers but endorses occasional chills. Her Tmax pre-procedure is 67F.  She has been NPO and is not currently on blood thinners.  Dr. Laurence Ferrari has met with patient this morning to discuss procedure which may require admission for observation if patient develops post-procedure pain or fever.  Patient is aware and has notified her family of potential for admission.  Will plan to keep in short stay for additional time today to monitor response and determine disposition accordingly.   Risks and benefits discussed with the patient including bleeding, infection, damage to adjacent structures, bowel perforation/fistula connection, and sepsis.  All of the patient's questions were answered, patient is agreeable  to proceed. Consent signed and in chart.  Thank you for this interesting consult.  I greatly enjoyed meeting Jessica [name] and look forward to participating in their care.  A copy of this report was sent to the requesting provider on this date.  Electronically Signed: Docia Barrier, PA 05/28/2018, 9:08 AM   I spent a total of  40 Minutes   in face to face in clinical consultation, greater than 50% of which was counseling/coordinating care for pelvic fluid collection.

## 2018-05-28 NOTE — Procedures (Signed)
Interventional Radiology Procedure Note  Procedure: US guided transvaginal drain placement. 8.5 F  Complications: None  Estimated Blood Loss: None  Recommendations: - Fluid sent for culture - Return to IR next week for drain check and removal.   Signed,  Criselda Peaches, MD

## 2018-05-28 NOTE — Discharge Instructions (Signed)
Surgical [facility] Care Surgical drains are used to remove extra fluid that normally builds up in a surgical wound after surgery. A surgical drain helps to heal a surgical wound. Different kinds of surgical drains include:   Active drains. These drains use suction to pull drainage away from the surgical wound. Drainage flows through a tube to a container outside of the body. It is important to keep the bulb or the drainage container flat (compressed) at all times, except while you empty it. Flattening the bulb or container creates suction. The two most common types of active drains are bulb drains and Hemovac drains.   How to care for your surgical drain 1. It is important to care for your drain to prevent infectionWrite down the color of your drainage and how often you change your dressing.  How to empty your active bulb or Hemovac drain  1. Make sure that you have a measuring cup that you can empty your drainage into. 2. Wash your hands with soap and water. If soap and water are not available, use hand sanitizer. 3. Gently move your fingers down the tube while squeezing very lightly. This is called stripping the tube. This clears any drainage, clots, or tissue from the tube. ? Do not pull on the tube. ? You may need to strip the tube several times every day to keep the tube clear. 4. Open the bulb cap or the drain plug. Do not touch the inside of the cap or the bottom of the plug. 5. Empty all of the drainage into the measuring cup. 6. Compress the bulb or the container and replace the cap or the plug. To compress the bulb or the container, squeeze it firmly in the middle while you close the cap or plug the container. 7. Write down the amount of drainage that you have in each 24-hour period. If you have less than 2 Tbsp (30 mL) of drainage during 24 hours, contact your health care provider. 8. Flush the drainage down the toilet. 9. Wash your hands with soap and water. Contact a health  care provider if:  You have more redness, swelling, or pain around your drain area.  The amount of drainage that you have is increasing instead of decreasing.  You have pus or a bad smell coming from your drain area.  You have a fever.  You have drainage that is cloudy.  There is a sudden stop or a sudden decrease in the amount of drainage that you have.  Your tube falls out.  Your active draindoes not stay compressedafter you empty it. Summary  Surgical drains are used to remove extra fluid that normally builds up in a surgical wound after surgery.  Different kinds of surgical drains include active drains and passive drains. Active drains use suction to pull drainage away from the surgical wound, and passive drains allow fluid to drain naturally.  It is important to care for your drain to prevent infection. If your drain is placed at your back, or any other hard-to-reach area, ask another person to assist you.  Contact your health care provider if you have more redness, swelling, or pain around your drain area. This information is not intended to replace advice given to you by your health care provider. Make sure you discuss any questions you have with your health care provider. Document Released: 01/04/2000 Document Revised: 01/29/2017 Document Reviewed: 07/26/2014 Elsevier Interactive Patient Education  2019 [employer].   There is a very helpful YouTube  video called  "[place] Patient Education" that can give you a visual aid of how to use a jackson pratt drain. This video is 2:56 long.      Moderate Conscious Sedation, Adult, Care After These instructions provide you with information about caring for yourself after your procedure. Your health care provider may also give you more specific instructions. Your treatment has been planned according to current medical practices, but problems sometimes occur. Call your health care  provider if you have any problems or questions after your procedure. What can I expect after the procedure? After your procedure, it is common:  To feel sleepy for several hours.  To feel clumsy and have poor balance for several hours.  To have poor judgment for several hours.  To vomit if you eat too soon. Follow these instructions at home: For at least 24 hours after the procedure:   Do not: ? Participate in activities where you could fall or become injured. ? Drive. ? Use heavy machinery. ? Drink alcohol. ? Take sleeping pills or medicines that cause drowsiness. ? Make important decisions or sign legal documents. ? Take care of children on your own.  Rest. Eating and drinking  Follow the diet recommended by your health care provider.  If you vomit: ? Drink water, juice, or soup when you can drink without vomiting. ? Make sure you have little or no nausea before eating solid foods. General instructions  Have a responsible adult stay with you until you are awake and alert.  Take over-the-counter and prescription medicines only as told by your health care provider.  If you smoke, do not smoke without supervision.  Keep all follow-up visits as told by your health care provider. This is important. Contact a health care provider if:  You keep feeling nauseous or you keep vomiting.  You feel light-headed.  You develop a rash.  You have a fever. Get help right away if:  You have trouble breathing. This information is not intended to replace advice given to you by your health care provider. Make sure you discuss any questions you have with your health care provider. Document Released: 10/27/2012 Document Revised: 06/11/2015 Document Reviewed: 04/28/2015 Elsevier Interactive Patient Education  2019 [employer].

## 2018-05-28 NOTE — Discharge Instructions (Signed)
Moderate Conscious Sedation, Adult, Care After °These instructions provide you with information about caring for yourself after your procedure. Your health care provider may also give you more specific instructions. Your treatment has been planned according to current medical practices, but problems sometimes occur. Call your health care provider if you have any problems or questions after your procedure. °What can I expect after the procedure? °After your procedure, it is common: °· To feel sleepy for several hours. °· To feel clumsy and have poor balance for several hours. °· To have poor judgment for several hours. °· To vomit if you eat too soon. °Follow these instructions at home: °For at least 24 hours after the procedure: ° °· Do not: °? Participate in activities where you could fall or become injured. °? Drive. °? Use heavy machinery. °? Drink alcohol. °? Take sleeping pills or medicines that cause drowsiness. °? Make important decisions or sign legal documents. °? Take care of children on your own. °· Rest. °Eating and drinking °· Follow the diet recommended by your health care provider. °· If you vomit: °? Drink water, juice, or soup when you can drink without vomiting. °? Make sure you have little or no nausea before eating solid foods. °General instructions °· Have a responsible adult stay with you until you are awake and alert. °· Take over-the-counter and prescription medicines only as told by your health care provider. °· If you smoke, do not smoke without supervision. °· Keep all follow-up visits as told by your health care provider. This is important. °Contact a health care provider if: °· You keep feeling nauseous or you keep vomiting. °· You feel light-headed. °· You develop a rash. °· You have a fever. °Get help right away if: °· You have trouble breathing. °This information is not intended to replace advice given to you by your health care provider. Make sure you discuss any questions you have  with your health care provider. °Document Released: 10/27/2012 Document Revised: 06/11/2015 Document Reviewed: 04/28/2015 °Elsevier Interactive Patient Education © 2019 [employer] ° °

## 2018-05-31 ENCOUNTER — Other Ambulatory Visit: Payer: Self-pay | Admitting: Gynecologic Oncology

## 2018-05-31 ENCOUNTER — Ambulatory Visit (HOSPITAL_COMMUNITY): Payer: Self-pay

## 2018-05-31 DIAGNOSIS — C541 Malignant neoplasm of endometrium: Secondary | ICD-10-CM

## 2018-06-02 ENCOUNTER — Other Ambulatory Visit: Payer: Self-pay | Admitting: Gynecologic Oncology

## 2018-06-02 ENCOUNTER — Ambulatory Visit
Admission: RE | Admit: 2018-06-02 | Discharge: 2018-06-02 | Disposition: A | Discharge status: Home / Self Care | Payer: Self-pay | Source: Ambulatory Visit | Attending: Student | Admitting: Student

## 2018-06-02 ENCOUNTER — Ambulatory Visit
Admission: RE | Admit: 2018-06-02 | Discharge: 2018-06-02 | Disposition: A | Discharge status: Home / Self Care | Payer: Self-pay | Source: Ambulatory Visit | Attending: Gynecologic Oncology | Admitting: Gynecologic Oncology

## 2018-06-02 ENCOUNTER — Encounter: Payer: Self-pay | Admitting: Radiology

## 2018-06-02 DIAGNOSIS — C541 Malignant neoplasm of endometrium: Secondary | ICD-10-CM

## 2018-06-02 DIAGNOSIS — R188 Other ascites: Secondary | ICD-10-CM

## 2018-06-02 HISTORY — PX: IR RADIOLOGIST EVAL & MGMT: IMG5224

## 2018-06-02 LAB — AEROBIC/ANAEROBIC CULTURE W GRAM STAIN (SURGICAL/DEEP WOUND): Special Requests: NORMAL

## 2018-06-02 LAB — AEROBIC/ANAEROBIC CULTURE (SURGICAL/DEEP WOUND)

## 2018-06-02 MED ORDER — IOPAMIDOL (ISOVUE-300) INJECTION 61%
100.0000 mL | Freq: Once | INTRAVENOUS | Status: AC | PRN
  Administered 2018-06-02: 100 mL via INTRAVENOUS

## 2018-06-02 MED FILL — NORMAL SALINE FLUSH SYRINGE: 0.9 | 30 days supply | Qty: 300 | Fill #0

## 2018-06-02 NOTE — Progress Notes (Addendum)
Referring Physician(s): Dr. Denman George  Chief Complaint: The patient is seen in follow up today s/p pelvic fluid collection drain placement 05/28/18  History of present illness: Jessica [name] is a 58 y.o. female with past medical history of endometrial cancer who underwent total hysterectomy with bilateral salpingo oophrectomy 04/13/18.  Patient states that since surgery she has had pelvic pain and pressure with constipation and difficulty urinating. Denies fever, chills, abdominal pain, nausea, vomiting.  Main complaint is pelvic pressure with inability to void completely.  She is not currently on antibiotics. She is not flushing the drain.   Past Medical History:  Diagnosis Date  . Elevated blood pressure reading   . Endometrial cancer ([place])   . PMB (postmenopausal bleeding)     Past Surgical History:  Procedure Laterality Date  . NECK SURGERY  2000   spinal surgery , titanium rod in place   . ROBOTIC ASSISTED TOTAL HYSTERECTOMY WITH BILATERAL SALPINGO OOPHERECTOMY N/A 04/13/2018   Procedure: XI ROBOTIC ASSISTED TOTAL HYSTERECTOMY WITH BILATERAL SALPINGO OOPHORECTOMY;  Surgeon: Everitt Amber, MD;  Location: [facility];  Service: Gynecology;  Laterality: N/A;  . ROBOTIC PELVIC AND PARA-AORTIC LYMPH NODE DISSECTION N/A 04/13/2018   Procedure: XI ROBOTIC PELVIC LYMPHADECTOMY AND PARA-AORTIC LYMPH NODE DISSECTION;  Surgeon: Everitt Amber, MD;  Location: [facility];  Service: Gynecology;  Laterality: N/A;    Allergies: Patient has no known allergies.  Medications: Prior to Admission medications   Medication Sig Start Date End Date Taking? Authorizing Provider  acetaminophen (TYLENOL) 500 MG tablet Take 1,000 mg by mouth every 6 (six) hours as needed for mild pain.    [provider]  amLODipine (NORVASC) 5 MG tablet Take 1 tablet (5 mg total) by mouth daily. 04/26/18   Elsie Stain, MD  atorvastatin (LIPITOR) 20 MG tablet Take 1 tablet (20 mg total) by mouth daily. 04/27/18   Elsie Stain, MD  diphenhydramine-acetaminophen (TYLENOL PM) 25-500 MG TABS tablet Take 1 tablet by mouth at bedtime as needed.    [provider]  gabapentin (NEURONTIN) 100 MG capsule Take 1 capsule (100 mg total) by mouth 2 (two) times daily. Do not take and drive if drowsiness occurs 05/03/18   Cross, Lenna Sciara D, NP  hydrocortisone (ANUSOL-HC) 2.5 % rectal cream Place 1 application rectally 2 (two) times daily. 05/25/18   Cross, Lenna Sciara D, NP  ibuprofen (ADVIL,MOTRIN) 600 MG tablet Take 1 tablet (600 mg total) by mouth every 6 (six) hours as needed for moderate pain. For AFTER surgery 03/31/18   Dorothyann Gibbs, NP  Multiple Vitamins-Minerals (WOMENS MULTIVITAMIN) TABS Take 1 tablet by mouth daily.    [provider]  ondansetron (ZOFRAN ODT) 4 MG disintegrating tablet 4mg  ODT q4 hours prn nausea/vomit 04/29/18   Deno Etienne, DO  polyethylene glycol (MIRALAX / GLYCOLAX) 17 g packet Take 17 g by mouth 2 (two) times daily as needed.    [provider]  senna-docusate (SENOKOT-S) 8.6-50 MG tablet Take 2 tablets by mouth at bedtime. For AFTER surgery, do not take if having loose stools 05/03/18   Joylene John D, NP     Family History  Problem Relation Age of Onset  . Hypertension Father   . Heart disease Father   . Cancer Father        lung  . Hypertension Mother     Social History   Socioeconomic History  . Marital status: Widowed    Spouse name: Not on file  . Number of children: Not  on file  . Years of education: Not on file  . Highest education level: Not on file  Occupational History  . Not on file  Social Needs  . Financial resource strain: Not on file  . Food insecurity:    Worry: Never true    Inability: Never true  . Transportation needs:    Medical: No    Non-medical: No  Tobacco Use  . Smoking status: Never Smoker  . Smokeless tobacco: Never Used  Substance and Sexual Activity  . Alcohol use: Yes    Alcohol/week: 4.0 standard drinks    Types: 4 Glasses  of wine per week    Comment: weekends  . Drug use: Never  . Sexual activity: Not Currently  Lifestyle  . Physical activity:    Days per week: Not on file    Minutes per session: Not on file  . Stress: Not on file  Relationships  . Social connections:    Talks on phone: Not on file    Gets together: Not on file    Attends religious service: Not on file    Active member of club or organization: Not on file    Attends meetings of clubs or organizations: Not on file    Relationship status: Not on file  Other Topics Concern  . Not on file  Social History Narrative  . Not on file     Vital Signs: BP (!) 144/80   Pulse 62   Temp 97.9 F (36.6 C) (Oral)   SpO2 98%   Physical Exam  NAD, alert Abdomen: soft, non-tender, non-distended Genitourinary:  Drain presents from the vagina. Small amount of serosanguinous fluid in collection bulb.   Imaging: No results found.  Labs:  CBC: Recent Labs    04/05/18 0814 04/29/18 1932 05/03/18 1215 05/28/18 0722  WBC 6.3 17.0* 10.9* 8.0  HGB 13.6 14.2 12.2 12.9  HCT 43.1 43.2 37.5 40.7  PLT 319 361 421* 383    COAGS: Recent Labs    05/28/18 0722  INR 1.0  APTT 30    BMP: Recent Labs    04/26/18 1027 04/29/18 1932 05/03/18 1215 05/05/18 1312  NA 141 137 138 141  K 4.5 4.1 3.6 4.3  CL 104 104 104 104  CO2 21 20* 25 25  GLUCOSE 94 97 115* 98  BUN 13 8 5* 8  CALCIUM 8.7 9.2 9.1 9.6  CREATININE 0.56* 0.43* 0.53 0.65  GFRNONAA 105 >60 >60 >60  GFRAA 121 >60 >60 >60    LIVER FUNCTION TESTS: Recent Labs    04/26/18 1027 04/29/18 1932 05/03/18 1215 05/05/18 1312  BILITOT 0.3 0.9 0.5 0.2*  AST 35 80* 67* 26  ALT 55* 115* 122* 88*  ALKPHOS 118* 180* 212* 210*  PROT 6.4 8.0 6.8 7.7  ALBUMIN 4.2 4.0 3.3* 3.3*    Assessment: Pelvic fluid collection s/p transvaginal drain placement 05/28/18 by Dr. Laurence Ferrari Patient presents to drain clinic today for follow-up of her pelvic collection drain. She reports little  improvement in symptoms at home.  Still having pelvic pressure with difficulty moving bowels.  CT imaging reviewed by Dr. Earleen Newport who notes continued fluid collection with well-positioned catheter. He recommends patient begin flushes daily.  Patient to return to clinic for drain injection in 2 weeks.  PA provided instructions and demonstrated flushing to patient during visit.  Her daughter is available at home to assist with this.  She is provided a prescription for flushes.   Signed: Docia Barrier,  PA 06/02/2018, 11:24 AM   Please refer to Dr. Earleen Newport attestation of this note for management and plan.

## 2018-06-03 ENCOUNTER — Telehealth: Payer: Self-pay | Admitting: Oncology

## 2018-06-03 ENCOUNTER — Other Ambulatory Visit: Payer: Self-pay | Admitting: Gynecologic Oncology

## 2018-06-03 DIAGNOSIS — R188 Other ascites: Secondary | ICD-10-CM

## 2018-06-03 MED ORDER — AMOXICILLIN-POT CLAVULANATE 875-125 MG PO TABS: 1.0000 | ORAL_TABLET | Freq: Two times a day (BID) | ORAL | 0 refills | Status: DC

## 2018-06-03 NOTE — Telephone Encounter (Signed)
Left a message for Collie Siad advising her that Augmentin will be sent to her pharmacy and requested a return call.

## 2018-06-03 NOTE — Telephone Encounter (Signed)
Jessica [name] called back and said the pain is much better.  She said she was able to have a bowel movement for the first time yesterday.  Advised her of the prescription for Augmentin that has been sent to [facility].  She is going to pick it up this afternoon.

## 2018-06-16 ENCOUNTER — Ambulatory Visit
Admission: RE | Admit: 2018-06-16 | Discharge: 2018-06-16 | Disposition: A | Discharge status: Home / Self Care | Payer: Self-pay | Source: Ambulatory Visit | Attending: Gynecologic Oncology | Admitting: Gynecologic Oncology

## 2018-06-16 ENCOUNTER — Encounter: Payer: Self-pay | Admitting: Radiology

## 2018-06-16 DIAGNOSIS — R188 Other ascites: Secondary | ICD-10-CM

## 2018-06-16 DIAGNOSIS — C541 Malignant neoplasm of endometrium: Secondary | ICD-10-CM

## 2018-06-16 HISTORY — PX: IR RADIOLOGIST EVAL & MGMT: IMG5224

## 2018-06-16 NOTE — Progress Notes (Signed)
Chief Complaint: F/U drain  Referring Physician(s): Rossi,Emma  Supervising Physician: Daryll Brod  History of Present Illness: Jessica [name] is a 58 y.o. female with past medical history of endometrial cancer who underwent total hysterectomy with bilateral salpingo oophrectomy 04/13/18.  CT imaging performed 05/26/2018 demonstrated a peripherally enhancing fluid collection in the pelvic cul-de-sac anterior to the rectum.  She underwent drain placement by Dr. Laurence Ferrari 05/28/2018.  She was seen in IR clinic on 06/02/2018. At that time, CT imaging reviewed by Dr. Earleen Newport who notes continued fluid collection with well-positioned catheter. He recommends patient begin flushes daily.   She is here again to for imaging and evaluation.  She states there is nothing draining. No nausea/vomiting. No Fever/chills. ROS negative.  She also states her "pelvic symptoms" have resolved.  Past Medical History:  Diagnosis Date   Elevated blood pressure reading    Endometrial cancer (HCC)    PMB (postmenopausal bleeding)     Past Surgical History:  Procedure Laterality Date   IR RADIOLOGIST EVAL & MGMT  06/02/2018   NECK SURGERY  2000   spinal surgery , titanium rod in place    ROBOTIC ASSISTED TOTAL HYSTERECTOMY WITH BILATERAL SALPINGO OOPHERECTOMY N/A 04/13/2018   Procedure: XI ROBOTIC ASSISTED TOTAL HYSTERECTOMY WITH BILATERAL SALPINGO OOPHORECTOMY;  Surgeon: Everitt Amber, MD;  Location: [facility];  Service: Gynecology;  Laterality: N/A;   ROBOTIC PELVIC AND PARA-AORTIC LYMPH NODE DISSECTION N/A 04/13/2018   Procedure: XI ROBOTIC PELVIC LYMPHADECTOMY AND PARA-AORTIC LYMPH NODE DISSECTION;  Surgeon: Everitt Amber, MD;  Location: [facility];  Service: Gynecology;  Laterality: N/A;    Allergies: Patient has no known allergies.  Medications: Prior to Admission medications   Medication Sig Start Date End Date Taking? Authorizing Provider  acetaminophen (TYLENOL) 500 MG tablet Take 1,000 mg  by mouth every 6 (six) hours as needed for mild pain.    [provider]  amLODipine (NORVASC) 5 MG tablet Take 1 tablet (5 mg total) by mouth daily. 04/26/18   Elsie Stain, MD  amoxicillin-clavulanate (AUGMENTIN) 875-125 MG tablet Take 1 tablet by mouth 2 (two) times daily. 06/03/18   Cross, Lenna Sciara D, NP  atorvastatin (LIPITOR) 20 MG tablet Take 1 tablet (20 mg total) by mouth daily. 04/27/18   Elsie Stain, MD  diphenhydramine-acetaminophen (TYLENOL PM) 25-500 MG TABS tablet Take 1 tablet by mouth at bedtime as needed.    [provider]  gabapentin (NEURONTIN) 100 MG capsule Take 1 capsule (100 mg total) by mouth 2 (two) times daily. Do not take and drive if drowsiness occurs 05/03/18   Cross, Lenna Sciara D, NP  hydrocortisone (ANUSOL-HC) 2.5 % rectal cream Place 1 application rectally 2 (two) times daily. 05/25/18   Cross, Lenna Sciara D, NP  ibuprofen (ADVIL,MOTRIN) 600 MG tablet Take 1 tablet (600 mg total) by mouth every 6 (six) hours as needed for moderate pain. For AFTER surgery 03/31/18   Dorothyann Gibbs, NP  Multiple Vitamins-Minerals (WOMENS MULTIVITAMIN) TABS Take 1 tablet by mouth daily.    [provider]  ondansetron (ZOFRAN ODT) 4 MG disintegrating tablet 4mg  ODT q4 hours prn nausea/vomit 04/29/18   Deno Etienne, DO  polyethylene glycol (MIRALAX / GLYCOLAX) 17 g packet Take 17 g by mouth 2 (two) times daily as needed.    [provider]  senna-docusate (SENOKOT-S) 8.6-50 MG tablet Take 2 tablets by mouth at bedtime. For AFTER surgery, do not take if having loose stools 05/03/18   Dorothyann Gibbs, NP  Family History  Problem Relation Age of Onset   Hypertension Father    Heart disease Father    Cancer Father        lung   Hypertension Mother     Social History   Socioeconomic History   Marital status: Widowed    Spouse name: Not on file   Number of children: Not on file   Years of education: Not on file   Highest education level: Not  on file  Occupational History   Not on file  Social Needs   Financial resource strain: Not on file   Food insecurity:    Worry: Never true    Inability: Never true   Transportation needs:    Medical: No    Non-medical: No  Tobacco Use   Smoking status: Never Smoker   Smokeless tobacco: Never Used  Substance and Sexual Activity   Alcohol use: Yes    Alcohol/week: 4.0 standard drinks    Types: 4 Glasses of wine per week    Comment: weekends   Drug use: Never   Sexual activity: Not Currently  Lifestyle   Physical activity:    Days per week: Not on file    Minutes per session: Not on file   Stress: Not on file  Relationships   Social connections:    Talks on phone: Not on file    Gets together: Not on file    Attends religious service: Not on file    Active member of club or organization: Not on file    Attends meetings of clubs or organizations: Not on file    Relationship status: Not on file  Other Topics Concern   Not on file  Social History Narrative   Not on file     Review of Systems: A 12 point ROS discussed and pertinent positives are indicated in the HPI above.  All other systems are negative.  Review of Systems  Vital Signs: There were no vitals taken for this visit.  Physical Exam Awake and alert NAD TV drain in place No fistula seen on injection. No drainage in the bulb Dr. Annamaria Boots reviewed images Drain removed without difficulty    Imaging: [place] Abscess Drain  Result Date: 05/28/2018 INDICATION: 58 year old female status post hysterectomy for endometrial cancer. She has had persistent pelvic pressure, abnormal bowel movements and difficulty with urination. CT imaging performed 05/26/2018 demonstrated a peripherally enhancing fluid collection in the pelvic cul-de-sac anterior to the rectum. She presents today for a and attempt at image guided drain placement. A limited transvaginal ultrasound will first be performed to ensure that the  fluid collection is drainable via a transvaginal approach. If amenable, will continue to percutaneous drain placement. EXAM: Limited transvaginal ultrasound Ultrasound-guided transvaginal drainage catheter placement MEDICATIONS: 2 g Rocephin intravenously ANESTHESIA/SEDATION: Fentanyl 100 mcg IV; Versed 5 mg IV Moderate Sedation Time:  19 minutes The patient was continuously monitored during the procedure by the interventional radiology nurse under my direct supervision. COMPLICATIONS: None immediate. PROCEDURE: The sonographer performed a limited transvaginal ultrasound. There is an irregular complex fluid collection within the cul-de-sac measuring approximately 3.3 x 2.7 x 4.5 cm (volume = 21 cm^3). The fluid collection appears reachable via a transvaginal approach through the vaginal cuff. Informed written consent was obtained from the patient after a thorough discussion of the procedural risks, benefits and alternatives. All questions were addressed. Maximal Sterile Barrier Technique was utilized including caps, mask, sterile gowns, sterile gloves, sterile drape, hand hygiene and skin antiseptic.  A timeout was performed prior to the initiation of the procedure. The vagina and perineum were sterilely prepped and draped in the standard fashion using Betadine. Vaginal probe was then prepared with sterile covering and a peel-away sheath was affixed parallel with the probe. The device was then generously lubricated. Under real-time transvaginal ultrasound, the fluid collection was identified. A 20 cm 21 gauge Chiba needle was then used to provide local anesthetic to the vaginal cuff by infiltration of 1% lidocaine. An 8.5 [place] cook all-purpose drainage catheter was then advanced through the peel-away sheath and inserted through the vaginal cuff into the fluid collection using trocar technique. The drain was advanced off the trocar and the pigtail formed. There was immediate drainage of approximately 10 cc of  serosanguineous fluid. Aspiration was also performed yielding an additional approximately 5 cc volume. This was sent for Gram stain and culture. Transvaginal ultrasound demonstrates significant reduction in the volume of the complex fluid collection. The drainage catheter was connected to JP bulb suction. The peel-away sheath and transvaginal probe were removed. The patient tolerated the procedure well. IMPRESSION: 1. Limited transvaginal ultrasound demonstrates an approximately 21 mL complex fluid collection in the pelvic cul-de-sac. 2. Successful placement of an 8.5 French transvaginal drainage catheter. Aspirated serosanguineous fluid and debris is were sent for Gram stain and culture. PLAN: IR drain clinic next week on Wednesday with contrast-enhanced CT scan of the pelvis and potential drain removal. No injection necessary. Signed, Criselda Peaches, MD, [facility] Electronically Signed   By: Jacqulynn Cadet M.D.   On: 05/28/2018 12:36   US Pelvis Transvanginal Non-ob (tv Only)  Result Date: 05/28/2018 INDICATION: 58 year old female status post hysterectomy for endometrial cancer. She has had persistent pelvic pressure, abnormal bowel movements and difficulty with urination. CT imaging performed 05/26/2018 demonstrated a peripherally enhancing fluid collection in the pelvic cul-de-sac anterior to the rectum. She presents today for a and attempt at image guided drain placement. A limited transvaginal ultrasound will first be performed to ensure that the fluid collection is drainable via a transvaginal approach. If amenable, will continue to percutaneous drain placement. EXAM: Limited transvaginal ultrasound Ultrasound-guided transvaginal drainage catheter placement MEDICATIONS: 2 g Rocephin intravenously ANESTHESIA/SEDATION: Fentanyl 100 mcg IV; Versed 5 mg IV Moderate Sedation Time:  19 minutes The patient was continuously monitored during  the procedure by the interventional radiology nurse under my direct supervision. COMPLICATIONS: None immediate. PROCEDURE: The sonographer performed a limited transvaginal ultrasound. There is an irregular complex fluid collection within the cul-de-sac measuring approximately 3.3 x 2.7 x 4.5 cm (volume = 21 cm^3). The fluid collection appears reachable via a transvaginal approach through the vaginal cuff. Informed written consent was obtained from the patient after a thorough discussion of the procedural risks, benefits and alternatives. All questions were addressed. Maximal Sterile Barrier Technique was utilized including caps, mask, sterile gowns, sterile gloves, sterile drape, hand hygiene and skin antiseptic. A timeout was performed prior to the initiation of the procedure. The vagina and perineum were sterilely prepped and draped in the standard fashion using Betadine. Vaginal probe was then prepared with sterile covering and a peel-away sheath was affixed parallel with the probe. The device was then generously lubricated. Under real-time transvaginal ultrasound, the fluid collection was identified. A 20 cm 21 gauge Chiba needle was then used to provide local anesthetic to the vaginal cuff by infiltration of 1% lidocaine. An 8.5 [place] cook all-purpose drainage catheter was then advanced through the peel-away sheath  and inserted through the vaginal cuff into the fluid collection using trocar technique. The drain was advanced off the trocar and the pigtail formed. There was immediate drainage of approximately 10 cc of serosanguineous fluid. Aspiration was also performed yielding an additional approximately 5 cc volume. This was sent for Gram stain and culture. Transvaginal ultrasound demonstrates significant reduction in the volume of the complex fluid collection. The drainage catheter was connected to JP bulb suction. The peel-away sheath and transvaginal probe were removed. The patient tolerated the procedure  well. IMPRESSION: 1. Limited transvaginal ultrasound demonstrates an approximately 21 mL complex fluid collection in the pelvic cul-de-sac. 2. Successful placement of an 8.5 French transvaginal drainage catheter. Aspirated serosanguineous fluid and debris is were sent for Gram stain and culture. PLAN: IR drain clinic next week on Wednesday with contrast-enhanced CT scan of the pelvis and potential drain removal. No injection necessary. Signed, Criselda Peaches, MD, [facility] Electronically Signed   By: Jacqulynn Cadet M.D.   On: 05/28/2018 12:36   Ct Abdomen Pelvis W Contrast  Result Date: 06/02/2018 CLINICAL DATA:  58 year old female with pelvic abscess EXAM: CT ABDOMEN AND PELVIS WITH CONTRAST TECHNIQUE: Multidetector CT imaging of the abdomen and pelvis was performed using the standard protocol following bolus administration of intravenous contrast. CONTRAST:  125mL ISOVUE-300 IOPAMIDOL (ISOVUE-300) INJECTION 61% COMPARISON:  05/26/2018 FINDINGS: Lower chest: No acute finding of the lower chest. Hepatobiliary: Unremarkable liver. Partially calcified stones within the gallbladder lumen. No inflammatory changes adjacent to the gallbladder. Pancreas: Unremarkable pancreas Spleen: Unremarkable Adrenals/Urinary Tract: Unremarkable adrenal glands. Unremarkable appearance of the kidneys. Unremarkable course of the ureters. Urinary bladder is relatively decompressed. Mild stranding surrounding the urinary bladder. Stomach/Bowel: Hiatal hernia. Unremarkable stomach. Small bowel decompressed without inflammatory changes. No transition point. Hyperdense appendix, with retained enteric contrast and no inflammatory changes. Moderate stool burden. No focal inflammatory changes of the proximal colon. Diverticular disease again noted, particularly of the sigmoid colon. There is significantly improved appearance of the rim enhancing fluid collection in the  recto uterine space, inferior to the rectosigmoid junction. Pigtail drainage catheter remains in place via transvaginal approach. There is a residual rim enhancing fluid collection at the drain measuring 3.4 cm. No enlarging or additional fluid collection. Vascular/Lymphatic: Minimal atherosclerosis of the abdominal aorta. Iliac arteries and proximal femoral arteries are patent. Unremarkable appearance of the venous system. Small lymph nodes in the inguinal region, along the iliac station, and preaortic/periaortic nodal stations. Reproductive: Hysterectomy. Transvaginal pigtail drainage catheter terminates adjacent to the rectosigmoid junction. There is persisting 3.4 cm rim enhancing fluid collection. Other: Fat containing umbilical hernia. Musculoskeletal: No acute displaced fracture. Degenerative changes of the thoracolumbar spine. IMPRESSION: Significant improvement in the pelvic abscess centered adjacent to rectosigmoid junction at the vaginal cuff, with transvaginal pigtail catheter in place. There is a small persisting rim enhancing fluid collection measuring 3.4 cm. Decreasing local inflammatory changes of sigmoid colon and the mesentery/peritoneum. Cholelithiasis. Hiatal hernia. Electronically Signed   By: Corrie Mckusick D.O.   On: 06/02/2018 11:24   Ct Abdomen Pelvis W Contrast  Result Date: 05/26/2018 CLINICAL DATA:  Persistent severe lower abdominal and pelvic pain. Constipation. 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 EXAM: CT ABDOMEN AND PELVIS WITH CONTRAST TECHNIQUE: Multidetector CT imaging of the abdomen and pelvis was performed using the standard protocol following bolus administration of intravenous contrast. CONTRAST:  12mL OMNIPAQUE IOHEXOL 300 MG/ML  SOLN COMPARISON:  04/29/2018 FINDINGS: Lower Chest: No acute  findings. Hepatobiliary: No hepatic masses identified. Gallstone again noted, however there is no evidence of cholecystitis or biliary dilatation. Pancreas:  No  mass or inflammatory changes. Spleen: Within normal limits in size and appearance. Adrenals/Urinary Tract: No masses identified. No evidence of hydronephrosis. Unremarkable unopacified urinary bladder. Stomach/Bowel: Small hiatal hernia again noted. No evidence of bowel obstruction. Increased wall thickening of the rectosigmoid colon, consistent with colitis. No definite diverticular disease seen. Vascular/Lymphatic: No pathologically enlarged lymph nodes. No abdominal aortic aneurysm. Reproductive: Prior hysterectomy. New rim enhancing fluid collection and adjacent inflammatory changes are seen in the hysterectomy bed, measuring 5.9 x 4.9 cm. This is suspicious for abscess. Other: Mild retroperitoneal soft tissue stranding surrounding the distal aorta and iliac vessels has decreased since previous study. Musculoskeletal:  No suspicious bone lesions identified. IMPRESSION: 1. New 5.9 cm rim enhancing fluid collection and adjacent inflammatory changes in hysterectomy bed, suspicious for abscess. 2. Increased wall thickening of rectosigmoid colon, consistent with colitis and likely secondary to adjacent abscess described above. 3. Decreased retroperitoneal soft tissue stranding surrounding distal aorta and iliac vessels. 4. Cholelithiasis. No radiographic evidence of cholecystitis. 5. Small hiatal hernia. These results will be called to the ordering clinician or representative by the Radiologist Assistant, and communication documented in the PACS or zVision Dashboard. Electronically Signed   By: Earle Gell M.D.   On: 05/26/2018 09:51   Ir Radiologist Eval & Mgmt  Result Date: 06/02/2018 Please refer to notes tab for details about interventional procedure. (Op Note)   Labs:  CBC: Recent Labs    04/05/18 0814 04/29/18 1932 05/03/18 1215 05/28/18 0722  WBC 6.3 17.0* 10.9* 8.0  HGB 13.6 14.2 12.2 12.9  HCT 43.1 43.2 37.5 40.7  PLT 319 361 421* 383    COAGS: Recent Labs    05/28/18 0722  INR 1.0    APTT 30    BMP: Recent Labs    04/26/18 1027 04/29/18 1932 05/03/18 1215 05/05/18 1312  NA 141 137 138 141  K 4.5 4.1 3.6 4.3  CL 104 104 104 104  CO2 21 20* 25 25  GLUCOSE 94 97 115* 98  BUN 13 8 5* 8  CALCIUM 8.7 9.2 9.1 9.6  CREATININE 0.56* 0.43* 0.53 0.65  GFRNONAA 105 >60 >60 >60  GFRAA 121 >60 >60 >60    LIVER FUNCTION TESTS: Recent Labs    04/26/18 1027 04/29/18 1932 05/03/18 1215 05/05/18 1312  BILITOT 0.3 0.9 0.5 0.2*  AST 35 80* 67* 26  ALT 55* 115* 122* 88*  ALKPHOS 118* 180* 212* 210*  PROT 6.4 8.0 6.8 7.7  ALBUMIN 4.2 4.0 3.3* 3.3*    TUMOR MARKERS: No results for input(s): AFPTM, CEA, CA199, CHROMGRNA in the last 8760 hours.  Assessment/Plan:  S/P total hysterectomy with bilateral salpingo oophrectomy 04/13/18.  Subsequent fluid collection with drain placed 05/28/2018.  Fluid collection now resolved and no fistula seen on injection.  Drain removed without difficulty.  Return here PRN.   Electronically Signed: Murrell Redden PA-C 06/16/2018, 11:44 AM    Please refer to Dr. Annamaria Boots attestation of this note for management and plan.

## 2018-06-30 MED FILL — ?AMLODIPINE BESYLATE 5MG TA: 5 | 30 days supply | Qty: 30 | Fill #2

## 2018-06-30 MED FILL — ?ATORVASTATIN 20 MG TABLET: 20 | 30 days supply | Qty: 30 | Fill #2

## 2018-07-29 MED FILL — ?AMLODIPINE BESYLATE 5MG TA: 5 | 30 days supply | Qty: 30 | Fill #3

## 2018-07-29 MED FILL — ?ATORVASTATIN 20 MG TABLET: 20 | 30 days supply | Qty: 30 | Fill #3

## 2018-09-02 MED FILL — ?AMLODIPINE BESYLATE 5MG TA: 5 | 30 days supply | Qty: 30 | Fill #4

## 2018-09-02 MED FILL — ?ATORVASTATIN 20 MG TABLET: 20 | 30 days supply | Qty: 30 | Fill #4

## 2018-09-03 ENCOUNTER — Telehealth: Payer: Self-pay | Admitting: *Deleted

## 2018-09-03 NOTE — Telephone Encounter (Signed)
Called and left the patient a message to call the office back. Need to schedule the patient to see Dr. Denman George in October

## 2018-09-06 NOTE — Telephone Encounter (Signed)
Returned the patient's call and scheduled an appt for October

## 2018-10-08 MED FILL — ?AMLODIPINE BESYLATE 5MG TA: 5 | 30 days supply | Qty: 30 | Fill #5

## 2018-10-08 MED FILL — ?ATORVASTATIN 20 MG TABLET: 20 | 30 days supply | Qty: 30 | Fill #5

## 2018-10-25 ENCOUNTER — Encounter: Payer: Self-pay | Admitting: Gynecologic Oncology

## 2018-10-25 ENCOUNTER — Inpatient Hospital Stay: Payer: Self-pay | Attending: Gynecologic Oncology | Admitting: Gynecologic Oncology

## 2018-10-25 ENCOUNTER — Other Ambulatory Visit: Payer: Self-pay

## 2018-10-25 VITALS — BP 149/84 | HR 55 | Resp 18 | Ht 64.0 in | Wt 214.5 lb

## 2018-10-25 DIAGNOSIS — Z90710 Acquired absence of both cervix and uterus: Secondary | ICD-10-CM

## 2018-10-25 DIAGNOSIS — E669 Obesity, unspecified: Secondary | ICD-10-CM | POA: Insufficient documentation

## 2018-10-25 DIAGNOSIS — Z79899 Other long term (current) drug therapy: Secondary | ICD-10-CM | POA: Insufficient documentation

## 2018-10-25 DIAGNOSIS — R7989 Other specified abnormal findings of blood chemistry: Secondary | ICD-10-CM

## 2018-10-25 DIAGNOSIS — C541 Malignant neoplasm of endometrium: Secondary | ICD-10-CM

## 2018-10-25 DIAGNOSIS — Z6838 Body mass index (BMI) 38.0-38.9, adult: Secondary | ICD-10-CM | POA: Insufficient documentation

## 2018-10-25 DIAGNOSIS — Z90722 Acquired absence of ovaries, bilateral: Secondary | ICD-10-CM

## 2018-10-25 NOTE — Patient Instructions (Addendum)
Please notify Dr Denman George at phone number [phone] if you notice vaginal bleeding, new pelvic or abdominal pains, bloating, feeling full easy, or a change in bladder or bowel function.   Please contact Dr Serita Grit office (at [phone]) in January, 2021 to request an appointment with her for April, 2021.   Dr Denman George recommends [facility] to establish a primary care doctor for general wellness as Dr Denman George is only able to offer cancer-specific care.

## 2018-10-25 NOTE — Progress Notes (Signed)
Follow-up Note: Gyn-Onc  Consult was requested by Dr. Kennon Rounds for the evaluation of Jessica [name] 58 y.o. female  CC:  Chief Complaint  Patient presents with  . Endometrial cancer ([place])  . Elevated LFTs    Assessment/Plan:  Jessica [name]  is a 58 y.o. 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   Low risk factors for recurrence (no myometrial invasion), therefore no adjuvant therapy was recommended according to NCCN guidelines.  I discussed risk for recurrence and typical symptoms encouraged her to notify us of these should they develop between visits.  I recommend she continue to have follow-up every 6 months for 5 years in accordance with NCCN guidelines. Those visits should include symptom assessment, physical exam and pelvic examination. Pap smears are not indicated or recommended in the routine surveillance of endometrial cancer. She will see me in 6 months.   HPI: Jessica [name] is a 58 year old P3 who is seen in consultation at the request of Dr Kennon Rounds for uterine carcinosarcoma.  The patient reports 61-month history of vaginal spotting which became more consistent with menstrual-like bleeding in January 2020.  She then presented to the emergency room at [facility] where a physical examination was performed which revealed a cervical mass, on March 24, 2018.  She then had a transvaginal ultrasound scan on March 24, 2018 which revealed a uterus measuring 7.1 x 3.3 x 4.9 cm with no gross masses within the uterus identified, the endometrium was thickened measuring 14 mm but with no focal abnormality, the right and left ovaries were not visualized, there was a trace amount of free pelvic fluid.  Due to the finding of a thickened endometrium and the cervical mass on examination she was sent for evaluation by Dr. Kennon Rounds.  On examination on March 29, 2026 Dr. Kennon Rounds identified that the entire cervix was replaced with a polypoid mass with  multiple projections.  She took biopsies from this mass which revealed carcinosarcoma.  The patient does not seek medical care frequently and does not carry medical diagnoses.  She is obese with a BMI of 38 kg/m.  Her father died from lung cancer but was a smoker.  She also has a maternal aunt who died from metastatic carcinoma but she is unknown of what primary diagnosis she had.  The patient has had no prior abdominal surgeries only prior neck surgery.  She had 3 pregnancies which were all vaginal deliveries.  Recent Hemoccult testing of her stool was positive and she has a strong urology consult scheduled for April 10, 2018.  She is never had a colonoscopy.  She works in Copy at [employer].  On 04/13/18 she underwent robotic assisted total hysterectomy, BSO, pelvic and para-aortic lymphadenectomy. Final pathology revealed a 5.2 cm polypoid carcinosarcoma tumor with no myometrial invasion, no LVSI, negative lymph nodes, cervix and adnexa. Due to the non-invasive nature of her disease, she was determined to have low risk factors for recurrence and in accordance with NCCN guidelines, no adjuvant therapy was recommended.  She initially did well postop, but then developed increasing abdominal pain and constipation at 2 weeks postop. She had sensation of incomplete fecal evacuation.  She was seen in the ED on 04/29/18 where a CT abd/pelvis was performed which showed no bowel obstruction. Liquid and semi formed stool throughout the colon without rectal distension or significant increased stool burden. No evidence of bowel obstruction. Short segment small bowel wall  thickening in the central abdomen with mild adjacent edema that is nonspecific, may be persistent irritation from recent abdominal surgery, or infectious enteritis. Post recent hysterectomy with pelvic and periaortic lymph node dissection. Expected stranding and small amount of free fluid in the hysterectomy operative  bed. There are 2 ovoid fluid density structures each measuring approximately 3 cm without peripheral enhancement, 1 to the right of the IVC at the iliac bifurcation, and at the left external iliac station. These likely represent postoperative seromas, however technically indeterminate in regards to sterility by CT. Incidental gallstones. Her CBC at that time showed an elevated WBC of 16,000. She was afebrile. She was treated with miralax and had improvement of symptoms. A repeat CBC on 05/03/18 showed the WBC had normalized to 10,000.  She has began experiencing left lower extremity pain which was worked up with doppler on 05/03/18 which was negative for DVT. She was empirically started on neurontin.   Interval Hx:  Her rectal pressure resolved and she is doing well without issues. She has no concerning symptoms for recurrence.   Current Meds:  Outpatient Encounter Medications as of 10/25/2018  Medication Sig  . acetaminophen (TYLENOL) 500 MG tablet Take 1,000 mg by mouth every 6 (six) hours as needed for mild pain.  Marland Kitchen amLODipine (NORVASC) 5 MG tablet Take 1 tablet (5 mg total) by mouth daily.  Marland Kitchen amoxicillin-clavulanate (AUGMENTIN) 875-125 MG tablet Take 1 tablet by mouth 2 (two) times daily.  Marland Kitchen atorvastatin (LIPITOR) 20 MG tablet Take 1 tablet (20 mg total) by mouth daily.  . diphenhydramine-acetaminophen (TYLENOL PM) 25-500 MG TABS tablet Take 1 tablet by mouth at bedtime as needed.  . gabapentin (NEURONTIN) 100 MG capsule Take 1 capsule (100 mg total) by mouth 2 (two) times daily. Do not take and drive if drowsiness occurs  . hydrocortisone (ANUSOL-HC) 2.5 % rectal cream Place 1 application rectally 2 (two) times daily.  Marland Kitchen ibuprofen (ADVIL,MOTRIN) 600 MG tablet Take 1 tablet (600 mg total) by mouth every 6 (six) hours as needed for moderate pain. For AFTER surgery  . Multiple Vitamins-Minerals (WOMENS MULTIVITAMIN) TABS Take 1 tablet by mouth daily.  . ondansetron (ZOFRAN ODT) 4 MG  disintegrating tablet 4mg  ODT q4 hours prn nausea/vomit  . polyethylene glycol (MIRALAX / GLYCOLAX) 17 g packet Take 17 g by mouth 2 (two) times daily as needed.  . senna-docusate (SENOKOT-S) 8.6-50 MG tablet Take 2 tablets by mouth at bedtime. For AFTER surgery, do not take if having loose stools   No facility-administered encounter medications on file as of 10/25/2018.     Allergy: No Known Allergies  Social Hx:   Social History   Socioeconomic History  . Marital status: Widowed    Spouse name: Not on file  . Number of children: Not on file  . Years of education: Not on file  . Highest education level: Not on file  Occupational History  . Not on file  Social Needs  . Financial resource strain: Not on file  . Food insecurity    Worry: Never true    Inability: Never true  . Transportation needs    Medical: No    Non-medical: No  Tobacco Use  . Smoking status: Never Smoker  . Smokeless tobacco: Never Used  Substance and Sexual Activity  . Alcohol use: Yes    Alcohol/week: 4.0 standard drinks    Types: 4 Glasses of wine per week    Comment: weekends  . Drug use: Never  . Sexual activity:  Not Currently  Lifestyle  . Physical activity    Days per week: Not on file    Minutes per session: Not on file  . Stress: Not on file  Relationships  . Social Herbalist on phone: Not on file    Gets together: Not on file    Attends religious service: Not on file    Active member of club or organization: Not on file    Attends meetings of clubs or organizations: Not on file    Relationship status: Not on file  . Intimate partner violence    Fear of current or ex partner: Not on file    Emotionally abused: Not on file    Physically abused: Not on file    Forced sexual activity: Not on file  Other Topics Concern  . Not on file  Social History Narrative  . Not on file    Past Surgical Hx:  Past Surgical History:  Procedure Laterality Date  . IR RADIOLOGIST EVAL &  MGMT  06/02/2018  . IR RADIOLOGIST EVAL & MGMT  06/16/2018  . NECK SURGERY  2000   spinal surgery , titanium rod in place   . ROBOTIC ASSISTED TOTAL HYSTERECTOMY WITH BILATERAL SALPINGO OOPHERECTOMY N/A 04/13/2018   Procedure: XI ROBOTIC ASSISTED TOTAL HYSTERECTOMY WITH BILATERAL SALPINGO OOPHORECTOMY;  Surgeon: Everitt Amber, MD;  Location: [facility];  Service: Gynecology;  Laterality: N/A;  . ROBOTIC PELVIC AND PARA-AORTIC LYMPH NODE DISSECTION N/A 04/13/2018   Procedure: XI ROBOTIC PELVIC LYMPHADECTOMY AND PARA-AORTIC LYMPH NODE DISSECTION;  Surgeon: Everitt Amber, MD;  Location: [facility];  Service: Gynecology;  Laterality: N/A;    Past Medical Hx:  Past Medical History:  Diagnosis Date  . Elevated blood pressure reading   . Endometrial cancer ([place])   . PMB (postmenopausal bleeding)     Past Gynecological History:  P3 (svd's) No LMP recorded. Patient has had a hysterectomy.  Family Hx:  Family History  Problem Relation Age of Onset  . Hypertension Father   . Heart disease Father   . Cancer Father        lung  . Hypertension Mother     Review of Systems:  Constitutional  Feels well,   ENT Normal appearing ears and nares bilaterally Skin/Breast  No rash, sores, jaundice, itching, dryness Cardiovascular  No chest pain, shortness of breath, or edema  Pulmonary  No cough or wheeze.  Gastro Intestinal  No nausea, vomitting, or diarrhoea. No bright red blood per rectum, no abdominal pain, change in bowel movement, or constipation.  Genito Urinary  No frequency, urgency, dysuria, no bleeding Musculo Skeletal  + left thigh and leg pain (calf) Neurologic  No weakness, numbness, change in gait,  Psychology  No depression, anxiety, insomnia.   Vitals:  Blood pressure (!) 149/84, pulse (!) 55, resp. rate 18, height 5\' 4"  (1.626 m), weight 214 lb 8 oz (97.3 kg), SpO2 99 %.  Physical Exam: WD in NAD Neck  Supple NROM, without any enlargements.  Lymph Node Survey No cervical  supraclavicular or inguinal adenopathy Cardiovascular  Pulse normal rate, regularity and rhythm. S1 and S2 normal.  Lungs  Clear to auscultation bilateraly, without wheezes/crackles/rhonchi. Good air movement.  Skin  No rash/lesions/breakdown  Psychiatry  Alert and oriented to person, place, and time  Abdomen  Normoactive bowel sounds, abdomen soft, non-tender and obese without evidence of hernia. Incisions well healed.  Back No CVA tenderness Genito Urinary:  Vaginal cuff smooth, free of lesions.  Rectal  No stool. There is an indurated cuff palpable.  Extremities  No edema  Thereasa Solo, MD  10/25/2018, 2:06 PM

## 2018-11-08 MED FILL — ?AMLODIPINE BESYLATE 5MG TA: 5 | 30 days supply | Qty: 30 | Fill #6

## 2018-11-08 MED FILL — ?ATORVASTATIN 20 MG TABLET: 20 | 30 days supply | Qty: 30 | Fill #6

## 2018-12-09 ENCOUNTER — Emergency Department (HOSPITAL_COMMUNITY)
Admission: EM | Admit: 2018-12-09 | Discharge: 2018-12-09 | Disposition: A | Discharge status: Home / Self Care | Payer: Self-pay | Attending: Emergency Medicine | Admitting: Emergency Medicine

## 2018-12-09 ENCOUNTER — Emergency Department (HOSPITAL_COMMUNITY): Payer: Self-pay

## 2018-12-09 ENCOUNTER — Encounter (HOSPITAL_COMMUNITY): Payer: Self-pay

## 2018-12-09 ENCOUNTER — Other Ambulatory Visit: Payer: Self-pay

## 2018-12-09 DIAGNOSIS — I10 Essential (primary) hypertension: Secondary | ICD-10-CM | POA: Insufficient documentation

## 2018-12-09 DIAGNOSIS — C541 Malignant neoplasm of endometrium: Secondary | ICD-10-CM | POA: Insufficient documentation

## 2018-12-09 DIAGNOSIS — R1030 Lower abdominal pain, unspecified: Secondary | ICD-10-CM | POA: Insufficient documentation

## 2018-12-09 DIAGNOSIS — Z79899 Other long term (current) drug therapy: Secondary | ICD-10-CM | POA: Insufficient documentation

## 2018-12-09 LAB — COMPREHENSIVE METABOLIC PANEL
ALT: 75 U/L — ABNORMAL HIGH (ref 0–44)
AST: 76 U/L — ABNORMAL HIGH (ref 15–41)
Albumin: 3.8 g/dL (ref 3.5–5.0)
Alkaline Phosphatase: 160 U/L — ABNORMAL HIGH (ref 38–126)
Anion gap: 12 (ref 5–15)
BUN: 9 mg/dL (ref 6–20)
CO2: 24 mmol/L (ref 22–32)
Calcium: 9.2 mg/dL (ref 8.9–10.3)
Chloride: 104 mmol/L (ref 98–111)
Creatinine, Ser: 0.45 mg/dL (ref 0.44–1.00)
GFR calc Af Amer: >60 mL/min (ref >60)
GFR calc non Af Amer: >60 mL/min (ref >60)
Glucose, Bld: 96 mg/dL (ref 70–99)
Potassium: 3.8 mmol/L (ref 3.5–5.1)
Sodium: 140 mmol/L (ref 135–145)
Total Bilirubin: 0.8 mg/dL (ref 0.3–1.2)
Total Protein: 7.6 g/dL (ref 6.5–8.1)

## 2018-12-09 LAB — CBC WITH DIFFERENTIAL/PLATELET
Abs Immature Granulocytes: 0.03 10*3/uL (ref 0.00–0.07)
Basophils Absolute: 0.1 10*3/uL (ref 0.0–0.1)
Basophils Relative: 0 %
Eosinophils Absolute: 0 10*3/uL (ref 0.0–0.5)
Eosinophils Relative: 0 %
HCT: 38.7 % (ref 36.0–46.0)
Hemoglobin: 12.3 g/dL (ref 12.0–15.0)
Immature Granulocytes: 0 %
Lymphocytes Relative: 20 %
Lymphs Abs: 2.4 10*3/uL (ref 0.7–4.0)
MCH: 28.1 pg (ref 26.0–34.0)
MCHC: 31.8 g/dL (ref 30.0–36.0)
MCV: 88.4 fL (ref 80.0–100.0)
Monocytes Absolute: 0.9 10*3/uL (ref 0.1–1.0)
Monocytes Relative: 8 %
Neutro Abs: 8.5 10*3/uL — ABNORMAL HIGH (ref 1.7–7.7)
Neutrophils Relative %: 72 %
Platelets: 381 10*3/uL (ref 150–400)
RBC: 4.38 MIL/uL (ref 3.87–5.11)
RDW: 13.6 % (ref 11.5–15.5)
WBC: 12 10*3/uL — ABNORMAL HIGH (ref 4.0–10.5)
nRBC: 0 % (ref 0.0–0.2)

## 2018-12-09 LAB — URINALYSIS, ROUTINE W REFLEX MICROSCOPIC
Bacteria, UA: NONE SEEN
Bilirubin Urine: NEGATIVE
Glucose, UA: NEGATIVE mg/dL
Hgb urine dipstick: NEGATIVE
Ketones, ur: 20 mg/dL — AB
Leukocytes,Ua: NEGATIVE
Nitrite: NEGATIVE
Protein, ur: 30 mg/dL — AB
Specific Gravity, Urine: 1.027 (ref 1.005–1.030)
pH: 5 (ref 5.0–8.0)

## 2018-12-09 LAB — LIPASE, BLOOD: Lipase: 25 U/L (ref 11–51)

## 2018-12-09 MED ORDER — HYDROMORPHONE HCL 1 MG/ML IJ SOLN
1.0000 mg | Freq: Once | INTRAMUSCULAR | Status: AC
  Administered 2018-12-09: 1 mg via INTRAVENOUS
  Filled 2018-12-09: qty 1

## 2018-12-09 MED ORDER — CEPHALEXIN 500 MG PO CAPS: 500.0000 mg | ORAL_CAPSULE | Freq: Four times a day (QID) | ORAL | 0 refills | Status: DC

## 2018-12-09 MED ORDER — ONDANSETRON HCL 8 MG PO TABS: 8.0000 mg | ORAL_TABLET | ORAL | 0 refills | Status: DC | PRN

## 2018-12-09 MED ORDER — DICYCLOMINE HCL 20 MG PO TABS: ORAL_TABLET | ORAL | 0 refills | Status: DC

## 2018-12-09 MED ORDER — IOHEXOL 300 MG/ML  SOLN
100.0000 mL | Freq: Once | INTRAMUSCULAR | Status: AC | PRN
  Administered 2018-12-09: 15:00:00 100 mL via INTRAVENOUS

## 2018-12-09 MED ORDER — ONDANSETRON HCL 4 MG/2ML IJ SOLN
4.0000 mg | Freq: Once | INTRAMUSCULAR | Status: AC
  Administered 2018-12-09: 4 mg via INTRAVENOUS
  Filled 2018-12-09: qty 2

## 2018-12-09 MED ORDER — SODIUM CHLORIDE 0.9 % IV BOLUS
1000.0000 mL | Freq: Once | INTRAVENOUS | Status: AC
  Administered 2018-12-09: 13:00:00 1000 mL via INTRAVENOUS

## 2018-12-09 MED ORDER — ONDANSETRON 4 MG PO TBDP: ORAL_TABLET | ORAL | 0 refills | Status: DC

## 2018-12-09 MED ORDER — OXYCODONE-ACETAMINOPHEN 5-325 MG PO TABS: 1.0000 | ORAL_TABLET | ORAL | 0 refills | Status: DC | PRN

## 2018-12-09 NOTE — ED Triage Notes (Signed)
Pt reports abdominal pressure since Monday, Pt reports nausea, no vomiting. Miralax to help with BM yesterday which caused to have diarrhea. Reports urgency to urinate and not emptying bladder . No burning

## 2018-12-09 NOTE — Discharge Instructions (Addendum)
I discussed your case with Dr. Denman George.  Her office will contact you for an appointment either tomorrow or Monday.  Prescription for pain, cramping, and nausea medicine sent to your pharmacy.

## 2018-12-09 NOTE — ED Provider Notes (Signed)
[facility] EMERGENCY DEPARTMENT Provider Note   CSN: [id number] Arrival date & time: 12/09/18  [id number]     History   Chief Complaint Chief Complaint  Patient presents with  . Abdominal Pain    HPI Jessica [name] is a 58 y.o. female.     Patient complains of abdominal cramping and urinary urgency.  The history is provided by the patient. No language interpreter was used.  Abdominal Pain Pain location:  Generalized Pain quality: aching   Pain radiates to:  Does not radiate Pain severity:  Mild Onset quality:  Sudden Timing:  Constant Progression:  Waxing and waning Chronicity:  New Context: not alcohol use   Associated symptoms: no chest pain, no cough, no diarrhea, no fatigue and no hematuria     Past Medical History:  Diagnosis Date  . Elevated blood pressure reading   . Endometrial cancer ([place])   . PMB (postmenopausal bleeding)     Patient Active Problem List   Diagnosis Date Noted  . Elevated LFTs 05/05/2018  . Hyperlipidemia 04/27/2018  . Essential hypertension 04/26/2018  . Endometrial cancer ([place]) 03/31/2018    Past Surgical History:  Procedure Laterality Date  . IR RADIOLOGIST EVAL & MGMT  06/02/2018  . IR RADIOLOGIST EVAL & MGMT  06/16/2018  . NECK SURGERY  2000   spinal surgery , titanium rod in place   . ROBOTIC ASSISTED TOTAL HYSTERECTOMY WITH BILATERAL SALPINGO OOPHERECTOMY N/A 04/13/2018   Procedure: XI ROBOTIC ASSISTED TOTAL HYSTERECTOMY WITH BILATERAL SALPINGO OOPHORECTOMY;  Surgeon: Everitt Amber, MD;  Location: [facility];  Service: Gynecology;  Laterality: N/A;  . ROBOTIC PELVIC AND PARA-AORTIC LYMPH NODE DISSECTION N/A 04/13/2018   Procedure: XI ROBOTIC PELVIC LYMPHADECTOMY AND PARA-AORTIC LYMPH NODE DISSECTION;  Surgeon: Everitt Amber, MD;  Location: [facility];  Service: Gynecology;  Laterality: N/A;     OB History    Gravida  4   Para  3   Term  3   Preterm  0   AB  1   Living  3     SAB  0   TAB  1   Ectopic  0   Multiple  0   Live Births  3            Home Medications    Prior to Admission medications   Medication Sig Start Date End Date Taking? Authorizing Provider  acetaminophen (TYLENOL) 500 MG tablet Take 1,000 mg by mouth every 6 (six) hours as needed for mild pain.   Yes [provider]  amLODipine (NORVASC) 5 MG tablet Take 1 tablet (5 mg total) by mouth daily. 04/26/18  Yes Elsie Stain, MD  atorvastatin (LIPITOR) 20 MG tablet Take 1 tablet (20 mg total) by mouth daily. 04/27/18  Yes Elsie Stain, MD  diphenhydramine-acetaminophen (TYLENOL PM) 25-500 MG TABS tablet Take 1 tablet by mouth at bedtime as needed.   Yes [provider]  Multiple Vitamins-Minerals (WOMENS MULTIVITAMIN) TABS Take 1 tablet by mouth daily.   Yes [provider]  polyethylene glycol (MIRALAX / GLYCOLAX) 17 g packet Take 17 g by mouth 2 (two) times daily as needed.    [provider]    Family History Family History  Problem Relation Age of Onset  . Hypertension Father   . Heart disease Father   . Cancer Father        lung  . Hypertension Mother     Social History Social History   Tobacco Use  . Smoking  status: Never Smoker  . Smokeless tobacco: Never Used  Substance Use Topics  . Alcohol use: Yes    Alcohol/week: 4.0 standard drinks    Types: 4 Glasses of wine per week    Comment: weekends  . Drug use: Never     Allergies   Patient has no known allergies.   Review of Systems Review of Systems  Constitutional: Negative for appetite change and fatigue.  HENT: Negative for congestion, ear discharge and sinus pressure.   Eyes: Negative for discharge.  Respiratory: Negative for cough.   Cardiovascular: Negative for chest pain.  Gastrointestinal: Positive for abdominal pain. Negative for diarrhea.  Genitourinary: Negative for frequency and hematuria.       Urinary urgency  Musculoskeletal: Negative for back pain.  Skin: Negative for rash.  Neurological:  Negative for seizures and headaches.  Psychiatric/Behavioral: Negative for hallucinations.     Physical Exam Updated Vital Signs BP 117/68   Pulse 65   Temp 97.9 F (36.6 C) (Oral)   Resp 18   Ht 5\' 4"  (1.626 m)   Wt 98.4 kg   SpO2 93%   BMI 37.25 kg/m   Physical Exam Vitals signs and nursing note reviewed.  Constitutional:      Appearance: She is well-developed.  HENT:     Head: Normocephalic.     Nose: Nose normal.  Eyes:     General: No scleral icterus.    Conjunctiva/sclera: Conjunctivae normal.  Neck:     Musculoskeletal: Neck supple.     Thyroid: No thyromegaly.  Cardiovascular:     Rate and Rhythm: Normal rate and regular rhythm.     Heart sounds: No murmur. No friction rub. No gallop.   Pulmonary:     Breath sounds: No stridor. No wheezing or rales.  Chest:     Chest wall: No tenderness.  Abdominal:     General: There is no distension.     Tenderness: There is abdominal tenderness. There is no rebound.  Musculoskeletal: Normal range of motion.  Lymphadenopathy:     Cervical: No cervical adenopathy.  Skin:    Findings: No erythema or rash.  Neurological:     Mental Status: She is oriented to person, place, and time.     Motor: No abnormal muscle tone.     Coordination: Coordination normal.  Psychiatric:        Behavior: Behavior normal.      ED Treatments / Results  Labs (all labs ordered are listed, but only abnormal results are displayed) Labs Reviewed  URINALYSIS, ROUTINE W REFLEX MICROSCOPIC - Abnormal; Notable for the following components:      Result Value   Color, Urine AMBER (*)    APPearance HAZY (*)    Ketones, ur 20 (*)    Protein, ur 30 (*)    All other components within normal limits  CBC WITH DIFFERENTIAL/PLATELET - Abnormal; Notable for the following components:   WBC 12.0 (*)    Neutro Abs 8.5 (*)    All other components within normal limits  COMPREHENSIVE METABOLIC PANEL - Abnormal; Notable for the following components:    AST 76 (*)    ALT 75 (*)    Alkaline Phosphatase 160 (*)    All other components within normal limits  URINE CULTURE  LIPASE, BLOOD    EKG None  Radiology No results found.  Procedures Procedures (including critical care time)  Medications Ordered in ED Medications  HYDROmorphone (DILAUDID) injection 1 mg (1 mg Intravenous Given  12/09/18 1308)  ondansetron (ZOFRAN) injection 4 mg (4 mg Intravenous Given 12/09/18 1308)  sodium chloride 0.9 % bolus 1,000 mL (1,000 mLs Intravenous New Bag/Given 12/09/18 1308)  iohexol (OMNIPAQUE) 300 MG/ML solution 100 mL (100 mLs Intravenous Contrast Given 12/09/18 1445)     Initial Impression / Assessment and Plan / ED Course  I have reviewed the triage vital signs and the nursing notes.  Pertinent labs & imaging results that were available during my care of the patient were reviewed by me and considered in my medical decision making (see chart for details). Pt with abd pain and urinary urgency.  Labs show scopic blood in her urine.  CT scan presently pending.  If CT scan unremarkable.  Patient will be treated for urinary tract infection     ct shows recurrent endometrial cancer   Final Clinical Impressions(s) / ED Diagnoses   Final diagnoses:  None    ED Discharge Orders    None       Milton Ferguson, MD 12/10/18 [phone]

## 2018-12-10 ENCOUNTER — Telehealth: Payer: Self-pay | Admitting: *Deleted

## 2018-12-10 NOTE — Telephone Encounter (Signed)
Called the patient and scheduled a phone visit for Tuesday

## 2018-12-11 LAB — URINE CULTURE

## 2018-12-13 ENCOUNTER — Telehealth: Payer: Self-pay | Admitting: Gynecologic Oncology

## 2018-12-13 NOTE — Telephone Encounter (Signed)
Left VM to confirm telephone encounter for 11/24 and verify demographics

## 2018-12-14 ENCOUNTER — Encounter: Payer: Self-pay | Admitting: Gynecologic Oncology

## 2018-12-14 ENCOUNTER — Encounter: Payer: Self-pay | Admitting: Oncology

## 2018-12-14 ENCOUNTER — Inpatient Hospital Stay: Payer: Self-pay | Attending: Gynecologic Oncology | Admitting: Gynecologic Oncology

## 2018-12-14 DIAGNOSIS — C786 Secondary malignant neoplasm of retroperitoneum and peritoneum: Secondary | ICD-10-CM

## 2018-12-14 DIAGNOSIS — C541 Malignant neoplasm of endometrium: Secondary | ICD-10-CM

## 2018-12-14 NOTE — Progress Notes (Signed)
Gynecologic Oncology Telehealth Follow-up Note: Gyn-Onc  I connected with Jessica [name] on 12/14/18 at 12:00 PM EST by telephone and verified that I am speaking with the correct person using two identifiers.  I discussed the limitations, risks, security and privacy concerns of performing an evaluation and management service by telemedicine and the availability of in-person appointments. I also discussed with the patient that there may be a patient responsible charge related to this service. The patient expressed understanding and agreed to proceed.  Other persons participating in the visit and their role in the encounter: none.  Patient's location: home Provider's location: [place]  Chief Complaint:  Chief Complaint  Patient presents with  . endometrial cancer    recurrence    Assessment/Plan:  [name]  is a 58 y.o. 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   I discussed the CT scan results with the patient.  I discussed that she had extensive carcinomatosis and ascites.  I explained that this represented cancer involvement of all of her internal organs and therefore surgical resection was not possible.  We will obtain a CT scan of the chest to evaluate for pulmonary metastases.  I do not feel strongly that a biopsy is necessary to confirm histologically the diagnosis given the close proximity to her original staging surgery in March 2020.  I discussed with Jessica [name] that the recommended treatment course for this is cytotoxic chemotherapy with at least 6 cycles of carboplatin paclitaxel, potentially more if imaging and Ca1 25 assessment at that time reveals persistent measurable disease.  I explained that if she experiences progression on this therapy line she will need to be changed to an alternative therapy line such as a ifosfamide-containing regimen.  I counseled her about the  importance of avoiding COVID-19 as this would delay initiation of treatment.  She is interested in receiving adjuvant chemotherapy locally at [facility].  We will facilitate consultation there.  HPI: [name] is a 58 year old P3 who was initially seen in consultation at the request of Dr Jessica [name] for uterine carcinosarcoma in March, 2020.  The patient reported a 47-month history of vaginal spotting which became more consistent with menstrual-like bleeding in January 2020.  She then presented to the emergency room at [facility] where a physical examination was performed which revealed a cervical mass, on March 24, 2018.  She then had a transvaginal ultrasound scan on March 24, 2018 which revealed a uterus measuring 7.1 x 3.3 x 4.9 cm with no gross masses within the uterus identified, the endometrium was thickened measuring 14 mm but with no focal abnormality, the right and left ovaries were not visualized, there was a trace amount of free pelvic fluid.  Due to the finding of a thickened endometrium and the cervical mass on examination she was sent for evaluation by Dr. Kennon [name].  On examination on March 29, 2026 Dr. Kennon [name] identified that the entire cervix was replaced with a polypoid mass with multiple projections. [name] identified that the entire cervix was replaced with a polypoid mass with multiple projections.  She took biopsies from this mass which revealed carcinosarcoma.  The patient did not seek medical care regularly and therefore did not carry formal medical diagnoses.  She was obese with a BMI of 38 kg/m.  Her father died from lung cancer but was a smoker.  She also has a maternal aunt who died from metastatic carcinoma but she is unknown of what primary diagnosis she had.  She had a history  of 3 pregnancies which were all vaginal deliveries.  She works in Copy at [employer].  On 04/13/18 she underwent robotic assisted total hysterectomy, BSO, pelvic and para-aortic lymphadenectomy. Final pathology revealed a 5.2 cm polypoid carcinosarcoma tumor with  no myometrial invasion, no LVSI, negative lymph nodes, cervix and adnexa. Due to the non-invasive nature of her disease, she was determined to have low risk factors for recurrence and in accordance with NCCN guidelines, no adjuvant therapy was recommended.  She initially did well postop, but then developed increasing abdominal pain and constipation at 2 weeks postop. She had sensation of incomplete fecal evacuation.  She was seen in the ED on 04/29/18 where a CT abd/pelvis was performed which showed no bowel obstruction. Liquid and semi formed stool throughout the colon without rectal distension or significant increased stool burden. No evidence of bowel obstruction. Short segment small bowel wall thickening in the central abdomen with mild adjacent edema that is nonspecific, may be persistent irritation from recent abdominal surgery, or infectious enteritis. Post recent hysterectomy with pelvic and periaortic lymph node dissection. Expected stranding and small amount of free fluid in the hysterectomy operative bed. There are 2 ovoid fluid density structures each measuring approximately 3 cm without peripheral enhancement, 1 to the right of the IVC at the iliac bifurcation, and at the left external iliac station. These likely represent postoperative seromas, however technically indeterminate in regards to sterility by CT. Incidental gallstones. Her CBC at that time showed an elevated WBC of 16,000. She was afebrile. She was treated with miralax and had improvement of symptoms and the WBC had normalized to 10,000.  She had began experiencing left lower extremity pain which was worked up with doppler on 05/03/18 which was negative for DVT. She was empirically started on neurontin.  Her rectal pressure resolved after she had concerns in May, 2020 which were worked up with a CT scan which failed to show pathology.   Interval Hx:  She was seen by me in October 2020 and had no symptoms concerning for  recurrence.  Her physical exam was normal at that time.  She began experiencing pelvic pains and pressure and abdominal bloating in November 2020.  This exacerbated until she was seen in the emergency department at [facility] on December 09, 2018.  A CT scan of the abdomen and pelvis was ordered and revealed extensive peritoneal carcinomatosis with very aggressive appearing new soft tissue masses in the abdomen and pelvis.  There was a large conglomerate partially enhancing soft tissue mass in the pelvis measuring 12 x 12 x 10 cm concerning for recurrent endometrial cancer.  There was also a left-sided lower abdominal mass along the omentum measuring 6.5 x 6 x 4.8 cm.  There was small volume volume abdominal pelvic ascites.  None of this disease was seen on the prior CT scan from May 2020.  She was prescribed analgesic medications and discharged from the ER and reported no or minimal symptoms to me.  She was devastated by hearing of her recurrence.  Current Meds:  Outpatient Encounter Medications as of 12/14/2018  Medication Sig  . acetaminophen (TYLENOL) 500 MG tablet Take 1,000 mg by mouth every 6 (six) hours as needed for mild pain.  Marland Kitchen amLODipine (NORVASC) 5 MG tablet Take 1 tablet (5 mg total) by mouth daily.  Marland Kitchen atorvastatin (LIPITOR) 20 MG tablet Take 1 tablet (20 mg total) by mouth daily.  Marland Kitchen dicyclomine (BENTYL) 20 MG tablet Take 1 every 8  hours as needed for abdominal cramping  . diphenhydramine-acetaminophen (TYLENOL PM) 25-500 MG TABS tablet Take 1 tablet by mouth at bedtime as needed.  . Multiple Vitamins-Minerals (WOMENS MULTIVITAMIN) TABS Take 1 tablet by mouth daily.  . ondansetron (ZOFRAN) 8 MG tablet Take 1 tablet (8 mg total) by mouth every 4 (four) hours as needed.  Marland Kitchen oxyCODONE-acetaminophen (PERCOCET) 5-325 MG tablet Take 1 tablet by mouth every 4 (four) hours as needed.  . polyethylene glycol (MIRALAX / GLYCOLAX) 17 g packet Take 17 g by mouth 2 (two) times daily as  needed.   No facility-administered encounter medications on file as of 12/14/2018.     Allergy: No Known Allergies  Social Hx:   Social History   Socioeconomic History  . Marital status: Widowed    Spouse name: Not on file  . Number of children: Not on file  . Years of education: Not on file  . Highest education level: Not on file  Occupational History  . Not on file  Social Needs  . Financial resource strain: Not on file  . Food insecurity    Worry: Never true    Inability: Never true  . Transportation needs    Medical: No    Non-medical: No  Tobacco Use  . Smoking status: Never Smoker  . Smokeless tobacco: Never Used  Substance and Sexual Activity  . Alcohol use: Yes    Alcohol/week: 4.0 standard drinks    Types: 4 Glasses of wine per week    Comment: weekends  . Drug use: Never  . Sexual activity: Not Currently  Lifestyle  . Physical activity    Days per week: Not on file    Minutes per session: Not on file  . Stress: Not on file  Relationships  . Social Herbalist on phone: Not on file    Gets together: Not on file    Attends religious service: Not on file    Active member of club or organization: Not on file    Attends meetings of clubs or organizations: Not on file    Relationship status: Not on file  . Intimate partner violence    Fear of current or ex partner: Not on file    Emotionally abused: Not on file    Physically abused: Not on file    Forced sexual activity: Not on file  Other Topics Concern  . Not on file  Social History Narrative  . Not on file    Past Surgical Hx:  Past Surgical History:  Procedure Laterality Date  . IR RADIOLOGIST EVAL & MGMT  06/02/2018  . IR RADIOLOGIST EVAL & MGMT  06/16/2018  . NECK SURGERY  2000   spinal surgery , titanium rod in place   . ROBOTIC ASSISTED TOTAL HYSTERECTOMY WITH BILATERAL SALPINGO OOPHERECTOMY N/A 04/13/2018   Procedure: XI ROBOTIC ASSISTED TOTAL HYSTERECTOMY WITH BILATERAL SALPINGO  OOPHORECTOMY;  Surgeon: Everitt Amber, MD;  Location: [facility];  Service: Gynecology;  Laterality: N/A;  . ROBOTIC PELVIC AND PARA-AORTIC LYMPH NODE DISSECTION N/A 04/13/2018   Procedure: XI ROBOTIC PELVIC LYMPHADECTOMY AND PARA-AORTIC LYMPH NODE DISSECTION;  Surgeon: Everitt Amber, MD;  Location: [facility];  Service: Gynecology;  Laterality: N/A;    Past Medical Hx:  Past Medical History:  Diagnosis Date  . Elevated blood pressure reading   . Endometrial cancer ([place])   . PMB (postmenopausal bleeding)     Past Gynecological History:  P3 (svd's) No LMP recorded. Patient has had a  hysterectomy.  Family Hx:  Family History  Problem Relation Age of Onset  . Hypertension Father   . Heart disease Father   . Cancer Father        lung  . Hypertension Mother     Review of Systems:  Constitutional  Feels well,   ENT Normal appearing ears and nares bilaterally Skin/Breast  No rash, sores, jaundice, itching, dryness Cardiovascular  No chest pain, shortness of breath, or edema  Pulmonary  No cough or wheeze.  Gastro Intestinal  No nausea, vomitting, or diarrhoea. No bright red blood per rectum, no abdominal pain, change in bowel movement, or constipation.  Genito Urinary  No frequency, urgency, dysuria, no bleeding Musculo Skeletal  + left thigh and leg pain (calf) Neurologic  No weakness, numbness, change in gait,  Psychology  No depression, anxiety, insomnia.   Vitals:  There were no vitals taken for this visit.  Physical Exam: Deferred due to phone visit.  I discussed the assessment and treatment plan with the patient. The patient was provided with an opportunity to ask questions and all were answered. The patient agreed with the plan and demonstrated an understanding of the instructions.   The patient was advised to call back or see an in-person evaluation if the symptoms worsen or if the condition fails to improve as anticipated.   I provided 20 minutes of non face-to-face  telephone visit time during this encounter, and > 50% was spent counseling as documented under my assessment & plan.   Thereasa Solo, MD  12/14/2018, 12:22 PM

## 2018-12-15 ENCOUNTER — Telehealth: Payer: Self-pay | Admitting: *Deleted

## 2018-12-15 NOTE — Telephone Encounter (Signed)
Called and left the patient a message with the patient information for the CT scan. Appt scheduled for 12/7 at [facility] and patient can only have liquids prior

## 2018-12-20 ENCOUNTER — Telehealth: Payer: Self-pay | Admitting: *Deleted

## 2018-12-20 NOTE — Telephone Encounter (Signed)
Returned the patient's call, explained that Dr Denman George is in the office today seeing patients. Explained that we will ask Dr Denman George about another phone visit with her family

## 2018-12-20 NOTE — Telephone Encounter (Signed)
Told [name] that another phone visit is not  feasible at this time. [name] wanted to know what her stage is and what chance percentage wise  Would she respond to the chemotherapy. Reviewed with Dr. Denman George. Told [name] that she is a Stage IA. When recurrence ocures the cancer is known as recurrent endometrial cancer no stage is assigned. Dr. Denman George said that she has a 66% of responding to chemotherapy with recurrent disease. She wanted to know what her life expectancy would be with out treatment. Told her that the medical oncologist could give her an approximate time based on studies at her appointment on 12-22-18.  Dr. Denman George had left for the day and was not in the office until 12-22-18. [name] appreciated the information.

## 2018-12-22 ENCOUNTER — Inpatient Hospital Stay (HOSPITAL_COMMUNITY): Payer: Self-pay | Attending: Hematology | Admitting: Hematology

## 2018-12-22 ENCOUNTER — Inpatient Hospital Stay (HOSPITAL_COMMUNITY): Payer: Self-pay

## 2018-12-22 ENCOUNTER — Encounter (HOSPITAL_COMMUNITY): Payer: Self-pay | Admitting: Hematology

## 2018-12-22 ENCOUNTER — Other Ambulatory Visit: Payer: Self-pay

## 2018-12-22 DIAGNOSIS — C786 Secondary malignant neoplasm of retroperitoneum and peritoneum: Secondary | ICD-10-CM | POA: Insufficient documentation

## 2018-12-22 DIAGNOSIS — Z5111 Encounter for antineoplastic chemotherapy: Secondary | ICD-10-CM | POA: Insufficient documentation

## 2018-12-22 DIAGNOSIS — Z79899 Other long term (current) drug therapy: Secondary | ICD-10-CM | POA: Insufficient documentation

## 2018-12-22 DIAGNOSIS — Z8542 Personal history of malignant neoplasm of other parts of uterus: Secondary | ICD-10-CM | POA: Insufficient documentation

## 2018-12-22 DIAGNOSIS — R112 Nausea with vomiting, unspecified: Secondary | ICD-10-CM | POA: Insufficient documentation

## 2018-12-22 DIAGNOSIS — C541 Malignant neoplasm of endometrium: Secondary | ICD-10-CM | POA: Insufficient documentation

## 2018-12-22 DIAGNOSIS — K5900 Constipation, unspecified: Secondary | ICD-10-CM | POA: Insufficient documentation

## 2018-12-22 DIAGNOSIS — Z90710 Acquired absence of both cervix and uterus: Secondary | ICD-10-CM | POA: Insufficient documentation

## 2018-12-22 DIAGNOSIS — Z8249 Family history of ischemic heart disease and other diseases of the circulatory system: Secondary | ICD-10-CM | POA: Insufficient documentation

## 2018-12-22 DIAGNOSIS — R109 Unspecified abdominal pain: Secondary | ICD-10-CM | POA: Insufficient documentation

## 2018-12-22 DIAGNOSIS — Z9079 Acquired absence of other genital organ(s): Secondary | ICD-10-CM | POA: Insufficient documentation

## 2018-12-22 DIAGNOSIS — R188 Other ascites: Secondary | ICD-10-CM | POA: Insufficient documentation

## 2018-12-22 DIAGNOSIS — Z801 Family history of malignant neoplasm of trachea, bronchus and lung: Secondary | ICD-10-CM | POA: Insufficient documentation

## 2018-12-22 DIAGNOSIS — Z5189 Encounter for other specified aftercare: Secondary | ICD-10-CM | POA: Insufficient documentation

## 2018-12-22 DIAGNOSIS — Z90722 Acquired absence of ovaries, bilateral: Secondary | ICD-10-CM | POA: Insufficient documentation

## 2018-12-22 LAB — CBC WITH DIFFERENTIAL/PLATELET
Abs Immature Granulocytes: 0.02 10*3/uL (ref 0.00–0.07)
Basophils Absolute: 0.1 10*3/uL (ref 0.0–0.1)
Basophils Relative: 1 %
Eosinophils Absolute: 0.1 10*3/uL (ref 0.0–0.5)
Eosinophils Relative: 1 %
HCT: 39.8 % (ref 36.0–46.0)
Hemoglobin: 12.4 g/dL (ref 12.0–15.0)
Immature Granulocytes: 0 %
Lymphocytes Relative: 20 %
Lymphs Abs: 1.9 10*3/uL (ref 0.7–4.0)
MCH: 27.3 pg (ref 26.0–34.0)
MCHC: 31.2 g/dL (ref 30.0–36.0)
MCV: 87.7 fL (ref 80.0–100.0)
Monocytes Absolute: 0.5 10*3/uL (ref 0.1–1.0)
Monocytes Relative: 6 %
Neutro Abs: 6.7 10*3/uL (ref 1.7–7.7)
Neutrophils Relative %: 72 %
Platelets: 564 10*3/uL — ABNORMAL HIGH (ref 150–400)
RBC: 4.54 MIL/uL (ref 3.87–5.11)
RDW: 13.3 % (ref 11.5–15.5)
WBC: 9.3 10*3/uL (ref 4.0–10.5)
nRBC: 0 % (ref 0.0–0.2)

## 2018-12-22 LAB — COMPREHENSIVE METABOLIC PANEL
ALT: 17 U/L (ref 0–44)
AST: 17 U/L (ref 15–41)
Albumin: 3.6 g/dL (ref 3.5–5.0)
Alkaline Phosphatase: 120 U/L (ref 38–126)
Anion gap: 11 (ref 5–15)
BUN: 9 mg/dL (ref 6–20)
CO2: 23 mmol/L (ref 22–32)
Calcium: 9.2 mg/dL (ref 8.9–10.3)
Chloride: 105 mmol/L (ref 98–111)
Creatinine, Ser: 0.52 mg/dL (ref 0.44–1.00)
GFR calc Af Amer: >60 mL/min (ref >60)
GFR calc non Af Amer: >60 mL/min (ref >60)
Glucose, Bld: 103 mg/dL — ABNORMAL HIGH (ref 70–99)
Potassium: 4 mmol/L (ref 3.5–5.1)
Sodium: 139 mmol/L (ref 135–145)
Total Bilirubin: 0.4 mg/dL (ref 0.3–1.2)
Total Protein: 7.4 g/dL (ref 6.5–8.1)

## 2018-12-22 LAB — LACTATE DEHYDROGENASE: LDH: 331 U/L — ABNORMAL HIGH (ref 98–192)

## 2018-12-22 NOTE — Assessment & Plan Note (Addendum)
1.  Recurrent endometrial carcinosarcoma: -Status post TAH, BSO, bilateral pelvic and para-aortic lymph node resections on 04/13/2018 -Pathology showed carcinosarcoma (malignant mixed mullerian tumor) arising in an endometrial type polyp with no myometrial invasion identified.  High-grade.  0/39 lymph nodes positive.  Pathological staging [id number], FIGO stage Ia.  MMR was normal.  MSI-stable. -She had 2 to 3 days of abdominal pain and presented to the ER on 12/09/2018.  She lost 15 pounds but was trying to lose weight. -CT abdomen and pelvis on 12/09/2018 shows extensive peritoneal carcinomatosis with very aggressive appearing new soft tissue masses in the abdomen and pelvis.  There is a large conglomerate partially enhancing soft tissue mass in the pelvis measuring approximately 12 x 12 x 10 cm most concerning for recurrent endometrial carcinoma.  There is also left-sided lower abdominal mass along the omentum measuring 6.5 x 6 x 4.8 cm with associated small volume abdominal/pelvic ascites.  No disease was seen on prior CT scan from May 2020. -She was evaluated by Dr. Denman George and was recommended to start chemotherapy with carboplatin and paclitaxel. -I have reviewed the scan and images with the patient and her daughter.  I have also talked to her brother on the phone. -I have recommended port placement.  We will also obtain a biopsy of the left omental mass.  I called and talked to the radiologist and Dr. Pascal Lux who kindly agreed to do the biopsy next Tuesday.  We will plan to start treatment next Wednesday. -We talked about chemotherapy with carboplatin and paclitaxel.  She is already scheduled for CT of the chest Monday. -She reports some numbness in both hands and fingers likely from carpal tunnel.  She does not have any numbness in lower extremities.  2.  Abdominal pain: -She has right lower quadrant and right line pain.  She is taking Percocet 1 tablet/day.  She is afraid to take more as it causes  constipation. -We talked about constipation regimen.  3.  Family history: -Father died of lung cancer and was a smoker.  Maternal aunt had metastatic cancer.  Patient does not know the type.  4.  Nausea and vomiting: -She is using Zofran 8 mg every 6 hours as needed.

## 2018-12-22 NOTE — Progress Notes (Signed)
[facility] CONSULT NOTE  Patient Care Team: Patient, No Pcp Per as PCP - General (General Practice)  CHIEF COMPLAINTS/PURPOSE OF CONSULTATION:  Recurrent endometrial carcinosarcoma.  HISTORY OF PRESENTING ILLNESS:  Jessica [name] 58 y.o. female is seen in consultation at the request of Dr. Denman George for administration of chemotherapy closer to home.  This patient had TAH, BSO and bilateral pelvic and para-aortic lymph node dissection done on 04/13/2018 and pathology showed stage Ia (PT1APN0) high-grade endometrial carcinosarcoma with no myometrial invasion.  She was not offered adjuvant therapy at that time.  She had abdominal pain 2 weeks postoperatively.  She went to the ER on 04/29/2018 when CT abdomen and pelvis showed expected stranding and small amount of free fluid in the hysterectomy operative bed.  There were 2 ovoid fluid density structures likely representing postoperative seromas.  She subsequently had another scan in May which did not show any evidence of recurrence.  She was being followed up with Dr. Denman George and was seen in October with no evidence of recurrence.  She began experiencing pelvic pains and pressure and abdominal bloating in November 2020.  She was seen at [facility] on 08/08/2018 and a CT scan of the abdomen and pelvis revealed extensive peritoneal carcinomatosis with very aggressive appearing new soft tissue mass in the abdomen and pelvis.  She was subsequently evaluated by Dr. Denman George on 12/14/2018 and was recommended at least 6 cycles of chemotherapy with carboplatin and paclitaxel.  She worked at [employer] for 34 years.  She was never smoker.  Family history significant for father who died of lung cancer smoker.  Maternal aunt had metastatic cancer.  Patient reports of right lower quadrant pain and right line pain which is constant and occasionally sharp.  She is taking Percocet 1 tablet daily as needed.  She is afraid of constipation.  She lost about  15 pounds and was trying to lose weight.  She reports decreased appetite.  She also has some nausea.  If she eats heavily, she throws up.  She vomited twice in the last 2 weeks.  MEDICAL HISTORY:  Past Medical History:  Diagnosis Date  . Elevated blood pressure reading   . Endometrial cancer ([place])   . PMB (postmenopausal bleeding)     SURGICAL HISTORY: Past Surgical History:  Procedure Laterality Date  . IR RADIOLOGIST EVAL & MGMT  06/02/2018  . IR RADIOLOGIST EVAL & MGMT  06/16/2018  . NECK SURGERY  2000   spinal surgery , titanium rod in place   . ROBOTIC ASSISTED TOTAL HYSTERECTOMY WITH BILATERAL SALPINGO OOPHERECTOMY N/A 04/13/2018   Procedure: XI ROBOTIC ASSISTED TOTAL HYSTERECTOMY WITH BILATERAL SALPINGO OOPHORECTOMY;  Surgeon: Everitt Amber, MD;  Location: [facility];  Service: Gynecology;  Laterality: N/A;  . ROBOTIC PELVIC AND PARA-AORTIC LYMPH NODE DISSECTION N/A 04/13/2018   Procedure: XI ROBOTIC PELVIC LYMPHADECTOMY AND PARA-AORTIC LYMPH NODE DISSECTION;  Surgeon: Everitt Amber, MD;  Location: [facility];  Service: Gynecology;  Laterality: N/A;    SOCIAL HISTORY: Social History   Socioeconomic History  . Marital status: Widowed    Spouse name: Not on file  . Number of children: Not on file  . Years of education: Not on file  . Highest education level: Not on file  Occupational History  . Not on file  Social Needs  . Financial resource strain: Not on file  . Food insecurity    Worry: Never true    Inability: Never true  . Transportation needs  Medical: No    Non-medical: No  Tobacco Use  . Smoking status: Never Smoker  . Smokeless tobacco: Never Used  Substance and Sexual Activity  . Alcohol use: Yes    Alcohol/week: 4.0 standard drinks    Types: 4 Glasses of wine per week    Comment: weekends  . Drug use: Never  . Sexual activity: Not Currently  Lifestyle  . Physical activity    Days per week: Not on file    Minutes per session: Not on file  . Stress: Not on file   Relationships  . Social Herbalist on phone: Not on file    Gets together: Not on file    Attends religious service: Not on file    Active member of club or organization: Not on file    Attends meetings of clubs or organizations: Not on file    Relationship status: Not on file  . Intimate partner violence    Fear of current or ex partner: Not on file    Emotionally abused: Not on file    Physically abused: Not on file    Forced sexual activity: Not on file  Other Topics Concern  . Not on file  Social History Narrative  . Not on file    FAMILY HISTORY: Family History  Problem Relation Age of Onset  . Hypertension Father   . Heart disease Father   . Cancer Father        lung  . Hypertension Mother     ALLERGIES:  has No Known Allergies.  MEDICATIONS:  Current Outpatient Medications  Medication Sig Dispense Refill  . amLODipine (NORVASC) 5 MG tablet Take 1 tablet (5 mg total) by mouth daily. 30 tablet 6  . atorvastatin (LIPITOR) 20 MG tablet Take 1 tablet (20 mg total) by mouth daily. 90 tablet 3  . dicyclomine (BENTYL) 20 MG tablet Take 1 every 8 hours as needed for abdominal cramping (Patient not taking: Reported on 12/22/2018) 15 tablet 0  . ondansetron (ZOFRAN) 8 MG tablet Take 1 tablet (8 mg total) by mouth every 4 (four) hours as needed. (Patient not taking: Reported on 12/22/2018) 10 tablet 0  . oxyCODONE-acetaminophen (PERCOCET) 5-325 MG tablet Take 1 tablet by mouth every 4 (four) hours as needed. (Patient not taking: Reported on 12/22/2018) 20 tablet 0  . polyethylene glycol (MIRALAX / GLYCOLAX) 17 g packet Take 17 g by mouth 2 (two) times daily as needed.     No current facility-administered medications for this visit.     REVIEW OF SYSTEMS:   Constitutional: Denies fevers, chills or abnormal night sweats Eyes: Denies blurriness of vision, double vision or watery eyes Ears, nose, mouth, throat, and face: Denies mucositis or sore throat Respiratory:  Denies cough, dyspnea or wheezes Cardiovascular: Denies palpitation, chest discomfort or lower extremity swelling Gastrointestinal: Positive for nausea and vomiting.  Positive for abdominal pain. Skin: Denies abnormal skin rashes Lymphatics: Denies new lymphadenopathy or easy bruising Neurological: Positive for numbness in the fingers. Behavioral/Psych: Mood is stable, no new changes  All other systems were reviewed with the patient and are negative.  PHYSICAL EXAMINATION: ECOG PERFORMANCE STATUS: 1 - Symptomatic but completely ambulatory  Vitals:   12/22/18 1259  BP: (!) 103/56  Pulse: 86  Resp: 20  Temp: (!) 97.5 F (36.4 C)  SpO2: 99%   Filed Weights   12/22/18 1259  Weight: 211 lb 4.8 oz (95.8 kg)    GENERAL:alert, no distress and comfortable SKIN:  skin color, texture, turgor are normal, no rashes or significant lesions EYES: normal, conjunctiva are pink and non-injected, sclera clear OROPHARYNX:no exudate, no erythema and lips, buccal mucosa, and tongue normal  NECK: supple, thyroid normal size, non-tender, without nodularity LYMPH:  no palpable lymphadenopathy in the cervical, axillary or inguinal LUNGS: clear to auscultation and percussion with normal breathing effort HEART: regular rate & rhythm and no murmurs and no lower extremity edema ABDOMEN: Soft, distended, tender in the right lower quadrant and lower right lung region. Musculoskeletal:no cyanosis of digits and no clubbing  PSYCH: alert & oriented x 3 with fluent speech NEURO: no focal motor/sensory deficits  LABORATORY DATA:  I have reviewed the data as listed Lab Results  Component Value Date   WBC 9.3 12/22/2018   HGB 12.4 12/22/2018   HCT 39.8 12/22/2018   MCV 87.7 12/22/2018   PLT 564 (H) 12/22/2018     Chemistry      Component Value Date/Time   NA 139 12/22/2018 1416   NA 141 04/26/2018 1027   K 4.0 12/22/2018 1416   CL 105 12/22/2018 1416   CO2 23 12/22/2018 1416   BUN 9 12/22/2018 1416    BUN 13 04/26/2018 1027   CREATININE 0.52 12/22/2018 1416      Component Value Date/Time   CALCIUM 9.2 12/22/2018 1416   ALKPHOS 120 12/22/2018 1416   AST 17 12/22/2018 1416   ALT 17 12/22/2018 1416   BILITOT 0.4 12/22/2018 1416   BILITOT 0.3 04/26/2018 1027       RADIOGRAPHIC STUDIES: I have personally reviewed the radiological images as listed and agreed with the findings in the report. Ct Abdomen Pelvis W Contrast  Result Date: 12/09/2018 CLINICAL DATA:  Abdominal pain and pressure for 4 days. No nausea. History of pelvic abscesses. EXAM: CT ABDOMEN AND PELVIS WITH CONTRAST TECHNIQUE: Multidetector CT imaging of the abdomen and pelvis was performed using the standard protocol following bolus administration of intravenous contrast. CONTRAST:  141m OMNIPAQUE IOHEXOL 300 MG/ML  SOLN COMPARISON:  CT scan 06/02/2018 FINDINGS: Lower chest: The lung bases are clear of an acute process. No pulmonary lesions are identified. The heart is normal in size. No pericardial effusion. There is a moderate size hiatal hernia noted. Hepatobiliary: No focal hepatic lesions or intrahepatic biliary dilatation. Moderate amount of perihepatic fluid is noted. The gallbladder contains numerous gallstones but no definite findings for acute cholecystitis. No common bile duct dilatation. Pancreas: No mass, inflammation or ductal dilatation. Spleen: Normal size.  No focal lesions.  Moderate perisplenic fluid. Adrenals/Urinary Tract: The adrenal glands and kidneys are unremarkable. No renal, ureteral or bladder calculi or mass. Stomach/Bowel: The stomach, duodenum, small bowel and colon are grossly normal. No acute inflammatory changes, mass lesions or obstructive findings. The terminal ileum is normal. The appendix is normal. Vascular/Lymphatic: The aorta and branch vessels are patent. No aneurysm or dissection. Stable ostial calcifications at the main branch vessels. Small scattered mesenteric and retroperitoneal lymph  nodes are noted. Unfortunately, there is new extensive peritoneal carcinomatosis with very aggressive appearing new soft tissue masses in the abdomen and pelvis. There is a large conglomerate partially enhancing soft tissue mass in the pelvis measuring approximately 12 x 12 x 10 cm and most concerning for recurrent endometrial carcinoma. There is also a left-sided lower abdominal mass along the omentum measuring approximately 6.5 x 6.0 x 4.8 cm. Associated small volume abdominal/pelvic ascites. I do not see any disease on the prior CT scan from May 2020. Reproductive: Surgically  absent. Other: No abdominal wall hernia or subcutaneous lesions. Musculoskeletal: The bony structures are unremarkable. IMPRESSION: 1. Fairly extensive aggressive and new abdominal/pelvic carcinomatosis consistent with recurrent endometrial cancer. 2. Associated small volume abdominal/pelvic ascites 3. No findings for bowel involvement or bowel obstruction. 4. Cholelithiasis. Electronically Signed   By: Marijo Sanes M.D.   On: 12/09/2018 15:23    ASSESSMENT & PLAN:  Endometrial cancer ([place]) 1.  Recurrent endometrial carcinosarcoma: -Status post TAH, BSO, bilateral pelvic and para-aortic lymph node resections on 04/13/2018 -Pathology showed carcinosarcoma (malignant mixed mullerian tumor) arising in an endometrial type polyp with no myometrial invasion identified.  High-grade.  0/39 lymph nodes positive.  Pathological staging PT1APN0, FIGO stage Ia.  MMR was normal.  MSI-stable. -She had 2 to 3 days of abdominal pain and presented to the ER on 12/09/2018.  She lost 15 pounds but was trying to lose weight. -CT abdomen and pelvis on 12/09/2018 shows extensive peritoneal carcinomatosis with very aggressive appearing new soft tissue masses in the abdomen and pelvis.  There is a large conglomerate partially enhancing soft tissue mass in the pelvis measuring approximately 12 x 12 x 10 cm most concerning for recurrent endometrial carcinoma.   There is also left-sided lower abdominal mass along the omentum measuring 6.5 x 6 x 4.8 cm with associated small volume abdominal/pelvic ascites.  No disease was seen on prior CT scan from May 2020. -She was evaluated by Dr. Denman George and was recommended to start chemotherapy with carboplatin and paclitaxel. -I have reviewed the scan and images with the patient and her daughter.  I have also talked to her brother on the phone. -I have recommended port placement.  We will also obtain a biopsy of the left omental mass.  I called and talked to the radiologist and Dr. Pascal Lux who kindly agreed to do the biopsy next Tuesday.  We will plan to start treatment next Wednesday. -We talked about chemotherapy with carboplatin and paclitaxel.  She is already scheduled for CT of the chest Monday. -She reports some numbness in both hands and fingers likely from carpal tunnel.  She does not have any numbness in lower extremities.  2.  Abdominal pain: -She has right lower quadrant and right line pain.  She is taking Percocet 1 tablet/day.  She is afraid to take more as it causes constipation. -We talked about constipation regimen.  3.  Family history: -Father died of lung cancer and was a smoker.  Maternal aunt had metastatic cancer.  Patient does not know the type.  4.  Nausea and vomiting: -She is using Zofran 8 mg every 6 hours as needed.  Orders Placed This Encounter  Procedures  . IR IMAGING GUIDED PORT INSERTION    Standing Status:   Future    Standing Expiration Date:   02/22/2020    Order Specific Question:   Reason for Exam (SYMPTOM  OR DIAGNOSIS REQUIRED)    Answer:   endometrial cancer    Order Specific Question:   Is the patient pregnant?    Answer:   No    Order Specific Question:   Preferred Imaging Location?    Answer:   [facility]  . CT GUIDED NEEDLE PLACEMENT    Standing Status:   Future    Standing Expiration Date:   03/21/2020    Order Specific Question:   If indicated for the  ordered procedure, I authorize the administration of contrast media per Radiology protocol    Answer:   Yes  Order Specific Question:   Reason for Exam (SYMPTOM  OR DIAGNOSIS REQUIRED)    Answer:   omental mass, endometrial cancer    Order Specific Question:   Is patient pregnant?    Answer:   No    Order Specific Question:   Preferred imaging location?    Answer:   [facility]    Order Specific Question:   Radiology Contrast Protocol - do NOT remove file path    Answer:   [email or web address]  . CA 125    Standing Status:   Future    Number of Occurrences:   1    Standing Expiration Date:   12/22/2019  . CBC with Differential/Platelet    Standing Status:   Future    Number of Occurrences:   1    Standing Expiration Date:   12/22/2019  . Comprehensive metabolic panel    Standing Status:   Future    Number of Occurrences:   1    Standing Expiration Date:   12/22/2019  . Lactate dehydrogenase    Standing Status:   Future    Number of Occurrences:   1    Standing Expiration Date:   12/22/2019    All questions were answered. The patient knows to call the clinic with any problems, questions or concerns.      [name], MD 12/22/2018 5:20 PM

## 2018-12-22 NOTE — Patient Instructions (Signed)
Jessica [name] at [facility] Discharge Instructions  You were seen today by Dr. Delton Coombes. He went over your history, family history and how you've been doing lately. He will schedule you for a port-a-cath placement. He will get blood work drawn today. He will see you back after your scan for follow up.   Thank you for choosing [place] at [facility] to provide your oncology and hematology care.  To afford each patient quality time with our provider, please arrive at least 15 minutes before your scheduled appointment time.   If you have a lab appointment with the [facility] please come in thru the  Main Entrance and check in at the main information desk  You need to re-schedule your appointment should you arrive 10 or more minutes late.  We strive to give you quality time with our providers, and arriving late affects you and other patients whose appointments are after yours.  Also, if you no show three or more times for appointments you may be dismissed from the clinic at the providers discretion.     Again, thank you for choosing [facility].  Our hope is that these requests will decrease the amount of time that you wait before being seen by our physicians.       _____________________________________________________________  Should you have questions after your visit to [facility], please contact our office at [phone] between the hours of 8:00 a.m. and 4:30 p.m.  Voicemails left after 4:00 p.m. will not be returned until the following business day.  For prescription refill requests, have your pharmacy contact our office and allow 72 hours.    Cancer Center Support Programs:   > Cancer Support Group  2nd Tuesday of the month 1pm-2pm, Journey Room

## 2018-12-23 ENCOUNTER — Other Ambulatory Visit (HOSPITAL_COMMUNITY): Payer: Self-pay | Admitting: Hematology

## 2018-12-23 ENCOUNTER — Inpatient Hospital Stay (HOSPITAL_COMMUNITY): Payer: Self-pay

## 2018-12-23 ENCOUNTER — Encounter (HOSPITAL_COMMUNITY): Payer: Self-pay | Admitting: *Deleted

## 2018-12-23 DIAGNOSIS — Z7189 Other specified counseling: Secondary | ICD-10-CM | POA: Insufficient documentation

## 2018-12-23 LAB — CA 125: Cancer Antigen (CA) 125: 127 U/mL — ABNORMAL HIGH (ref 0.0–38.1)

## 2018-12-23 NOTE — Progress Notes (Signed)
START ON PATHWAY REGIMEN - Uterine     A cycle is every 21 days:     Paclitaxel      Carboplatin   **Always confirm dose/schedule in your pharmacy ordering system**  Patient Characteristics: Carcinosarcomas, Newly Diagnosed, Medically Inoperable, Symptomatic Histology: Carcinosarcoma Therapeutic Status: Newly Diagnosed AJCC T Category: TX AJCC N Category: NX AJCC M Category: M0 AJCC 8 Stage Grouping: Unknown Surgical Status: Medically Inoperable Asymptomatic or Symptomatic<= Symptomatic Intent of Therapy: Non-Curative / Palliative Intent, Discussed with Patient

## 2018-12-24 ENCOUNTER — Encounter: Payer: Self-pay | Admitting: General Practice

## 2018-12-24 ENCOUNTER — Encounter (HOSPITAL_COMMUNITY): Payer: Self-pay

## 2018-12-24 DIAGNOSIS — Z95828 Presence of other vascular implants and grafts: Secondary | ICD-10-CM

## 2018-12-24 HISTORY — DX: Presence of other vascular implants and grafts: Z95.828

## 2018-12-24 MED ORDER — LIDOCAINE-PRILOCAINE 2.5-2.5 % EX CREA: TOPICAL_CREAM | CUTANEOUS | 3 refills | Status: DC

## 2018-12-24 MED ORDER — PROCHLORPERAZINE MALEATE 10 MG PO TABS: 10.0000 mg | ORAL_TABLET | Freq: Four times a day (QID) | ORAL | 1 refills | Status: DC | PRN

## 2018-12-24 NOTE — Progress Notes (Signed)
[place] CSW Progress Notes  Referral sent to [facility] for assistance w disability application.  Also referred to [place], [facility] and [place].  A Sharlet Salina, Estate manager/land agent, asked to assist w [employer] gift card at next visit and to assess for any other available financial assistance options  Edwyna Shell, [place] Worker Phone:  [phone] Cell:  [phone]

## 2018-12-24 NOTE — Patient Instructions (Signed)
[place] are diagnosed with recurrent endometrial carcinosarcoma.  You will be treated every 3 weeks for at least 6 cycles with a combination of chemotherapy drugs - paclitaxel (Taxol) and carboplatin.  The intent of treatment is to control your cancer and prevent it from spreading further, and to help alleviate any symptoms you may be having related to your disease.  You will see the doctor regularly throughout treatment.  We will obtain blood work from you prior to every treatment and monitor your results to make sure it is safe to give your treatment. The doctor monitors your response to treatment by the way you are feeling, your blood work, and by obtaining scans periodically.  There will be wait times while you are here for treatment.  It will take about 30 minutes to 1 hour for your lab work to result.  Then there will be wait times while pharmacy mixes your medications.   Medications you will receive in the clinic prior to your chemotherapy medications:  Aloxi:  ALOXI is used in adults to help prevent the nausea and vomiting that happens with certain chemotherapy drugs.  Aloxi is a long acting medication, and will remain in your system for about 2 days.   Emend:  This is an anti-nausea medication that is used with Aloxi to help prevent nausea and vomiting caused by chemotherapy.  Dexamethasone:  This is a steroid given prior to chemotherapy to help prevent allergic reactions; it may also help prevent and control nausea and diarrhea.   Pepcid:  This medication is a histamine blocker that helps prevent and allergic reaction to your chemotherapy.   Benadryl:  This is a histamine blocker (different from the Pepcid) that helps prevent allergic/infusion reactions to your chemotherapy. This medication may cause dizziness/drowsiness.    Paclitaxel (Taxol)  About This Drug Paclitaxel is a drug used to treat cancer. It is given in the vein (IV) through  your portacath.  This will take 3 hours to infuse.  This first infusion will take longer to give because the rate of infusion is increased slowly to monitor for reactions.  The nurse will be in the room with you for the first 15 minutes of the first infusion.  Possible Side Effects  . Hair loss. Hair loss is often temporary, although with certain medicine, hair loss can sometimes be permanent. Hair loss may happen suddenly or gradually. If you lose hair, you may lose it from your head, face, armpits, pubic area, chest, and/or legs. You may also notice your hair getting thin.  . Swelling of your legs, ankles and/or feet (edema)  . Flushing  . Nausea and throwing up (vomiting)  . Loose bowel movements (diarrhea)  . Bone marrow depression. This is a decrease in the number of white blood cells, red blood cells, and platelets. This may raise your risk of infection, make you tired and weak (fatigue), and raise your risk of bleeding.  . Effects on the nerves are called peripheral neuropathy. You may feel numbness, tingling, or pain in your hands and feet. It may be hard for you to button your clothes, open jars, or walk as usual. The effect on the nerves may get worse with more doses of the drug. These effects get better in some people after the drug is stopped but it does not get better in all people.  . Changes in your liver function  . Bone, joint and muscle pain  . Abnormal EKG  .  Allergic reaction: Allergic reactions, including anaphylaxis are rare but may happen in some patients. Signs of allergic reaction to this drug may be swelling of the face, feeling like your tongue or throat are swelling, trouble breathing, rash, itching, fever, chills, feeling dizzy, and/or feeling that your heart is beating in a fast or not normal way. If this happens, do not take another dose of this drug. You should get urgent medical treatment.  . Infection  . Changes in your kidney function.  Note: Each of  the side effects above was reported in 20% or greater of patients treated with paclitaxel. Not all possible side effects are included above.  Warnings and Precautions  . Severe allergic reactions  . Severe bone marrow depression  Treating Side Effects  . To help with hair loss, wash with a mild shampoo and avoid washing your hair every day.  . Avoid rubbing your scalp, instead, pat your hair or scalp dry  . Avoid coloring your hair  . Limit your use of hair spray, electric curlers, blow dryers, and curling irons.  . If you are interested in getting a wig, talk to your nurse. You can also call the [facility] [place] [facility] at [phone]-2345 to find out information about the "Look Good, Feel Better" program close to where you live. It is a free program where women getting chemotherapy can learn about wigs, turbans and scarves as well as makeup techniques and skin and nail care.  . Ask your doctor or nurse about medicines that are available to help stop or lessen diarrhea and/or nausea.  . To help with nausea and vomiting, eat small, frequent meals instead of three large meals a day. Choose foods and drinks that are at room temperature. Ask your nurse or doctor about other helpful tips and medicine that is available to help or stop lessen these symptoms.  . If you get diarrhea, eat low-fiber foods that are high in protein and calories and avoid foods that can irritate your digestive tracts or lead to cramping. Ask your nurse or doctor about medicine that can lessen or stop your diarrhea.  . Mouth care is very important. Your mouth care should consist of routine, gentle cleaning of your teeth or dentures and rinsing your mouth with a mixture of 1/2 teaspoon of salt in 8 ounces of water or  teaspoon of baking soda in 8 ounces of water. This should be done at least after each meal and at bedtime.  . If you have mouth sores, avoid mouthwash that has alcohol. Also avoid alcohol and smoking because  they can bother your mouth and throat.  . Drink plenty of fluids (a minimum of eight glasses per day is recommended).  . Take your temperature as your doctor or nurse tells you, and whenever you feel like you may have a fever.  . Talk to your doctor or nurse about precautions you can take to avoid infections and bleeding.  . Be careful when cooking, walking, and handling sharp objects and hot liquids.  Food and Drug Interactions  . There are no known interactions of paclitaxel with food.  . This drug may interact with other medicines. Tell your doctor and pharmacist about all the medicines and dietary supplements (vitamins, minerals, herbs and others) that you are taking at this time.  . The safety and use of dietary supplements and alternative diets are often not known. Using these might affect your cancer or interfere with your treatment. Until more is known, you should  not use dietary supplements or alternative diets without your cancer doctor's help.  When to Call the Doctor  Call your doctor or nurse if you have any of the following symptoms and/or any new or unusual symptoms:  . Fever of 100.5 F (38 C) or above  . Chills  . Redness, pain, warmth, or swelling at the IV site during the infusion  . Signs of allergic reaction: swelling of the face, feeling like your tongue or throat are swelling, trouble breathing, rash, itching, fever, chills, feeling dizzy, and/or feeling that your heart is beating in a fast or not normal way  . Feeling that your heart is beating in a fast or not normal way (palpitations)  . Weight gain of 5 pounds in one week (fluid retention)  . Decreased urine or very dark urine  . Signs of liver problems: dark urine, pale bowel movements, bad stomach pain, feeling very tired and weak, unusual  itching, or yellowing of the eyes or skin  . Heavy menstrual period that lasts longer than normal  . Easy bruising or bleeding  . Nausea that stops you from  eating or drinking, and/or that is not relieved by prescribed medicines.  . Loose bowel movements (diarrhea) more than 4 times a day or diarrhea with weakness or lightheadedness  . Pain in your mouth or throat that makes it hard to eat or drink  . Lasting loss of appetite or rapid weight loss of five pounds in a week  . Signs of peripheral neuropathy: numbness, tingling, or decreased feeling in fingers or toes; trouble walking or changes in the way you walk; or feeling clumsy when buttoning clothes, opening jars, or other routine activities  . Joint and muscle pain that is not relieved by prescribed medicines  . Extreme fatigue that interferes with normal activities  . While you are getting this drug, please tell your nurse right away if you have any pain, redness, or swelling at the site of the IV infusion.  . If you think you are pregnant.  Reproduction Warnings  . Pregnancy warning: This drug may have harmful effects on the unborn child, it is recommended that effective methods of birth control should be used during your cancer treatment. Let your doctor know right away if you think you may be pregnant.  . Breast feeding warning: Women should not breast feed during treatment because this drug could enter the breastmilk and cause harm to a breast feeding baby.   Carboplatin (Paraplatin, CBDCA)  About This Drug Carboplatin is used to treat cancer. It is given in the vein through your port a cath.  It will take 30 minutes to infuse.     Possible Side Effects . Bone marrow suppression. This is a decrease in the number of white blood cells, red blood cells, and platelets. This may raise your risk of infection, make you tired and weak (fatigue), and raise your risk of bleeding. . Nausea and vomiting (throwing up) . Weakness . Changes in your liver function . Changes in your kidney function . Electrolyte changes . Pain  Note: Each of the side effects above was reported in 20%  or greater of patients treated with carboplatin. Not all possible side effects are included above.  Warnings and Precautions . Severe bone marrow suppression . Allergic reactions, including anaphylaxis are rare but may happen in some patients. Signs of allergic reaction to this drug may be swelling of the face, feeling like your tongue or throat are swelling, trouble  breathing, rash, itching, fever, chills, feeling dizzy, and/or feeling that your heart is beating in a fast or not normal way. If this happens, do not take another dose of this drug. You should get urgent medical treatment. . Severe nausea and vomiting . Effects on the nerves are called peripheral neuropathy. This risk is increased if you are over the age of 46 or if you have received other medicine with risk of peripheral neuropathy. You may feel numbness, tingling, or pain in your hands and feet. It may be hard for you to button your clothes, open jars, or walk as usual. The effect on the nerves may get worse with more doses of the drug. These effects get better in some people after the drug is stopped but it does not get better in all people. Marland Kitchen Blurred vision, loss of vision or other changes in eyesight . Decreased hearing . Skin and tissue irritation including redness, pain, warmth, or swelling at the IV site if the drug leaks out of the vein and into nearby tissue. . Severe changes in your kidney function, which can cause kidney failure . Severe changes in your liver function, which can cause liver failure  Note: Some of the side effects above are very rare. If you have concerns and/or questions, please discuss them with your medical team.  Important Information . This drug may be present in the saliva, tears, sweat, urine, stool, vomit, semen, and vaginal secretions. Talk to your doctor and/or your nurse about the necessary precautions to take during this time.  Treating Side Effects . Manage tiredness by pacing  your activities for the day. . Be sure to include periods of rest between energy-draining activities. . To decrease the risk of infection, wash your hands regularly. . Avoid close contact with people who have a cold, the flu, or other infections. . Take your temperature as your doctor or nurse tells you, and whenever you feel like you may have a fever. . To help decrease the risk of bleeding, use a soft toothbrush. Check with your nurse before using dental floss. . Be very careful when using knives or tools. . Use an electric shaver instead of a razor. . Drink plenty of fluids (a minimum of eight glasses per day is recommended). . If you throw up or have loose bowel movements, you should drink more fluids so that you do not become dehydrated (lack of water in the body from losing too much fluid). . To help with nausea and vomiting, eat small, frequent meals instead of three large meals a day. Choose foods and drinks that are at room temperature. Ask your nurse or doctor about other helpful tips and medicine that is available to help stop or lessen these symptoms. . If you have numbness and tingling in your hands and feet, be careful when cooking, walking, and handling sharp objects and hot liquids. Marland Kitchen Keeping your pain under control is important to your well-being. Please tell your doctor or nurse if you are experiencing pain.  Food and Drug Interactions . There are no known interactions of carboplatin with food. . This drug may interact with other medicines. Tell your doctor and pharmacist about all the prescription and over-the-counter medicines and dietary supplements (vitamins, minerals, herbs and others) that you are taking at this time. Also, check with your doctor or pharmacist before starting any new prescription or over-the-counter medicines, or dietary supplements to make sure that there are no interactions.  When to Call the Doctor Call  your doctor or nurse if you have any of  these symptoms and/or any new or unusual symptoms: . Fever of 100.4 F (38 C) or higher . Chills . Tiredness that interferes with your daily activities . Feeling dizzy or lightheaded . Easy bleeding or bruising . Nausea that stops you from eating or drinking and/or is not relieved by prescribed medicines . Throwing up more than 3 times a day . Blurred vision or other changes in eyesight . Decrease in hearing or ringing in the ear . Signs of allergic reaction: swelling of the face, feeling like your tongue or throat are swelling, trouble breathing, rash, itching, fever, chills, feeling dizzy, and/or feeling that your heart is beating in a fast or not normal way. If this happens, call 911 for emergency care. . While you are getting this drug, please tell your nurse right away if you have any pain, redness, or swelling at the site of the IV infusion . Signs of possible liver problems: dark urine, pale bowel movements, bad stomach pain, feeling very tired and weak, unusual itching, or yellowing of the eyes or skin . Decreased urine, or very dark urine . Numbness, tingling, or pain in your hands and feet . Pain that does not go away or is not relieved by prescribed medicine . If you think you may be pregnant  Reproduction Warnings . Pregnancy warning: This drug may have harmful effects on the unborn baby. Women of child bearing potential should use effective methods of birth control during your cancer treatment. Let your doctor know right away if you think you may be pregnant. . Breastfeeding warning: It is not known if this drug passes into breast milk. For this reason, women should not breastfeed during treatment because this drug could enter the breast milk and cause harm to a breastfeeding baby. . Fertility warning: Human fertility studies have not been done with this drug. Talk with your doctor or nurse if you plan to have children. Ask for information on sperm or egg banking.  SELF  CARE ACTIVITIES WHILE RECEIVING CHEMOTHERAPY:  Hydration Increase your fluid intake 48 hours prior to treatment and drink at least 8 to 12 cups (64 ounces) of water/decaffeinated beverages per day after treatment. You can still have your cup of coffee or soda but these beverages do not count as part of your 8 to 12 cups that you need to drink daily. No alcohol intake.  Medications Continue taking your normal prescription medication as prescribed.  If you start any new herbal or new supplements please let us know first to make sure it is safe.  Mouth Care Have teeth cleaned professionally before starting treatment. Keep dentures and partial plates clean. Use soft toothbrush and do not use mouthwashes that contain alcohol. Biotene is a good mouthwash that is available at most pharmacies or may be ordered by calling [phone]. Use warm salt water gargles (1 teaspoon salt per 1 quart warm water) before and after meals and at bedtime. If you need dental work, please let the doctor know before you go for your appointment so that we can coordinate the best possible time for you in regards to your chemo regimen. You need to also let your dentist know that you are actively taking chemo. We may need to do labs prior to your dental appointment.  Skin Care Always use sunscreen that has not expired and with SPF (Sun Protection Factor) of 50 or higher. Wear hats to protect your head from the sun.  Remember to use sunscreen on your hands, ears, face, & feet.  Use good moisturizing lotions such as udder cream, eucerin, or even Vaseline. Some chemotherapies can cause dry skin, color changes in your skin and nails.    . Avoid long, hot showers or baths. . Use gentle, fragrance-free soaps and laundry detergent. . Use moisturizers, preferably creams or ointments rather than lotions because the thicker consistency is better at preventing skin dehydration. Apply the cream or ointment within 15 minutes of showering.  Reapply moisturizer at night, and moisturize your hands every time after you wash them.  Hair Loss (if your doctor says your hair will fall out)  . If your doctor says that your hair is likely to fall out, decide before you begin chemo whether you want to wear a wig. You may want to shop before treatment to match your hair color. . Hats, turbans, and scarves can also camouflage hair loss, although some people prefer to leave their heads uncovered. If you go bare-headed outdoors, be sure to use sunscreen on your scalp. . Cut your hair short. It eases the inconvenience of shedding lots of hair, but it also can reduce the emotional impact of watching your hair fall out. . Don't perm or color your hair during chemotherapy. Those chemical treatments are already damaging to hair and can enhance hair loss. Once your chemo treatments are done and your hair has grown back, it's OK to resume dyeing or perming hair.  With chemotherapy, hair loss is almost always temporary. But when it grows back, it may be a different color or texture. In older adults who still had hair color before chemotherapy, the new growth may be completely gray.  Often, new hair is very fine and soft.  Infection Prevention Please wash your hands for at least 30 seconds using warm soapy water. Handwashing is the #1 way to prevent the spread of germs. Stay away from sick people or people who are getting over a cold. If you develop respiratory systems such as green/yellow mucus production or productive cough or persistent cough let us know and we will see if you need an antibiotic. It is a good idea to keep a pair of gloves on when going into grocery stores/[employer] to decrease your risk of coming into contact with germs on the carts, etc. Carry alcohol hand gel with you at all times and use it frequently if out in public. If your temperature reaches 100.5 or higher please call the clinic and let us know.  If it is after hours or on the weekend  please go to the ER if your temperature is over 100.5.  Please have your own personal thermometer at home to use.    Sex and bodily fluids If you are going to have sex, a condom must be used to protect the person that isn't taking chemotherapy. Chemo can decrease your libido (sex drive). For a few days after chemotherapy, chemotherapy can be excreted through your bodily fluids.  When using the toilet please close the lid and flush the toilet twice.  Do this for a few day after you have had chemotherapy.   Effects of chemotherapy on your sex life Some changes are simple and won't last long. They won't affect your sex life permanently.  Sometimes you may feel: . too tired . not strong enough to be very active . sick or sore  . not in the mood . anxious or low Your anxiety might not seem related to sex.  For example, you may be worried about the cancer and how your treatment is going. Or you may be worried about money, or about how you family are coping with your illness. These things can cause stress, which can affect your interest in sex. It's important to talk to your partner about how you feel. Remember - the changes to your sex life don't usually last long. There's usually no medical reason to stop having sex during chemo. The drugs won't have any long term physical effects on your performance or enjoyment of sex. Cancer can't be passed on to your partner during sex  Contraception It's important to use reliable contraception during treatment. Avoid getting pregnant while you or your partner are having chemotherapy. This is because the drugs may harm the baby. Sometimes chemotherapy drugs can leave a man or woman infertile.  This means you would not be able to have children in the future. You might want to talk to someone about permanent infertility. It can be very difficult to learn that you may no longer be able to have children. Some people find counselling helpful. There might be ways to  preserve your fertility, although this is easier for men than for women. You may want to speak to a fertility expert. You can talk about sperm banking or harvesting your eggs. You can also ask about other fertility options, such as donor eggs. If you have or have had breast cancer, your doctor might advise you not to take the contraceptive pill. This is because the hormones in it might affect the cancer.  It is not known for sure whether or not chemotherapy drugs can be passed on through semen or secretions from the vagina. Because of this some doctors advise people to use a barrier method if you have sex during treatment. This applies to vaginal, anal or oral sex. Generally, doctors advise a barrier method only for the time you are actually having the treatment and for about a week after your treatment. Advice like this can be worrying, but this does not mean that you have to avoid being intimate with your partner. You can still have close contact with your partner and continue to enjoy sex.  Animals If you have cats or birds we just ask that you not change the litter or change the cage.  Please have someone else do this for you while you are on chemotherapy.   Food Safety During and After Cancer Treatment Food safety is important for people both during and after cancer treatment. Cancer and cancer treatments, such as chemotherapy, radiation therapy, and stem cell/bone marrow transplantation, often weaken the immune system. This makes it harder for your body to protect itself from foodborne illness, also called food poisoning. Foodborne illness is caused by eating food that contains harmful bacteria, parasites, or viruses.  Foods to avoid Some foods have a higher risk of becoming tainted with bacteria. These include: Marland Kitchen Unwashed fresh fruit and vegetables, especially leafy vegetables that can hide dirt and other contaminants . Raw sprouts, such as alfalfa sprouts . Raw or undercooked beef, especially  ground beef, or other raw or undercooked meat and poultry . Fatty, fried, or spicy foods immediately before or after treatment.  These can sit heavy on your stomach and make you feel nauseous. . Raw or undercooked shellfish, such as oysters. . Sushi and sashimi, which often contain raw fish.  . Unpasteurized beverages, such as unpasteurized fruit juices, raw milk, raw yogurt, or cider . Undercooked eggs, such  as soft boiled, over easy, and poached; raw, unpasteurized eggs; or foods made with raw egg, such as homemade raw cookie dough and homemade mayonnaise  Simple steps for food safety  Shop smart. . Do not buy food stored or displayed in an unclean area. . Do not buy bruised or damaged fruits or vegetables. . Do not buy cans that have cracks, dents, or bulges. . Pick up foods that can spoil at the end of your shopping trip and store them in a cooler on the way home.  Prepare and clean up foods carefully. . Rinse all fresh fruits and vegetables under running water, and dry them with a clean towel or paper towel. . Clean the top of cans before opening them. . After preparing food, wash your hands for 20 seconds with hot water and soap. Pay special attention to areas between fingers and under nails. . Clean your utensils and dishes with hot water and soap. Marland Kitchen Disinfect your kitchen and cutting boards using 1 teaspoon of liquid, unscented bleach mixed into 1 quart of water.    Dispose of old food. . Eat canned and packaged food before its expiration date (the "use by" or "best before" date). . Consume refrigerated leftovers within 3 to 4 days. After that time, throw out the food. Even if the food does not smell or look spoiled, it still may be unsafe. Some bacteria, such as Listeria, can grow even on foods stored in the refrigerator if they are kept for too long.  Take precautions when eating out. . At restaurants, avoid buffets and salad bars where food sits out for a long time and comes in  contact with many people. Food can become contaminated when someone with a virus, often a norovirus, or another "bug" handles it. . Put any leftover food in a "to-go" container yourself, rather than having the server do it. And, refrigerate leftovers as soon as you get home. . Choose restaurants that are clean and that are willing to prepare your food as you order it cooked.   AT HOME MEDICATIONS:                                                                                                                                                                Compazine/Prochlorperazine 10mg  tablet. Take 1 tablet every 6 hours as needed for nausea/vomiting. (This can make you sleepy)   EMLA cream. Apply a quarter size amount to port site 1 hour prior to chemo. Do not rub in. Cover with plastic wrap.    Diarrhea Sheet   If you are having loose stools/diarrhea, please purchase Imodium and begin taking as outlined:  At the first sign of poorly formed or loose stools you should begin taking Imodium (loperamide) 2 mg capsules.  Take two tablets (4mg ) followed by one tablet (2mg ) every 2 hours - DO NOT EXCEED 8 tablets in 24 hours.  If it is bedtime and you are having loose stools, take 2 tablets at bedtime, then 2 tablets every 4 hours until morning.   Always call the [facility] if you are having loose stools/diarrhea that you can't get under control.  Loose stools/diarrhea leads to dehydration (loss of water) in your body.  We have other options of trying to get the loose stools/diarrhea to stop but you must let us know!   Constipation Sheet  Colace - 100 mg capsules - take 2 capsules daily.  If this doesn't help then you can increase to 2 capsules twice daily.  Please call if the above does not work for you. Do not go more than 2 days without a bowel movement.  It is very important that you do not become constipated.  It will make you feel sick to your stomach (nausea) and can cause abdominal pain  and vomiting.  Nausea Sheet   Compazine/Prochlorperazine 10mg  tablet. Take 1 tablet every 6 hours as needed for nausea/vomiting (This can make you drowsy).  If you are having persistent nausea (nausea that does not stop) please call the [facility] and let us know the amount of nausea that you are experiencing.  If you begin to vomit, you need to call the [facility] and if it is the weekend and you have vomited more than one time and can't get it to stop-go to the Emergency Room.  Persistent nausea/vomiting can lead to dehydration (loss of fluid in your body) and will make you feel very weak and unwell. Ice chips, sips of clear liquids, foods that are at room temperature, crackers, and toast tend to be better tolerated.   SYMPTOMS TO REPORT AS SOON AS POSSIBLE AFTER TREATMENT:  FEVER 100.4 F OR GREATER  CHILLS WITH OR WITHOUT FEVER  NAUSEA AND VOMITING THAT IS NOT CONTROLLED WITH YOUR NAUSEA MEDICATION  UNUSUAL SHORTNESS OF BREATH  UNUSUAL BRUISING OR BLEEDING  TENDERNESS IN MOUTH AND THROAT WITH OR WITHOUT   PRESENCE OF ULCERS  URINARY PROBLEMS  BOWEL PROBLEMS  UNUSUAL RASH      Wear comfortable clothing and clothing appropriate for easy access to any Portacath or PICC line. Let us know if there is anything that we can do to make your therapy better!    What to do if you need assistance after hours or on the weekends: CALL [phone].  HOLD on the line, do not hang up.  You will hear multiple messages but at the end you will be connected with a nurse triage line.  They will contact the doctor if necessary.  Most of the time they will be able to assist you.  Do not call the hospital operator.      I have been informed and understand all of the instructions given to me and have received a copy. I have been instructed to call the clinic [phone] or my family physician as soon as possible for continued medical care, if indicated. I do not have any more questions  at this time but understand that I may call the [facility] or the Patient Navigator at [phone] during office hours should I have questions or need assistance in obtaining follow-up care.

## 2018-12-24 NOTE — Progress Notes (Signed)
[facility] Initial Psychosocial Assessment Clinical Social Work  Clinical Social Work contacted by phone to assess psychosocial, emotional, mental health, and spiritual needs of the patient.   Barriers to care/review of distress screen:  - Transportation:  Do you anticipate any problems getting to appointments?  Do you have someone who can help run errands for you if you need it?  Daughter has been transporting to appointments. Has car and drives.  "Not sure what will happen when I start chemo."   - Help at home:  What is your living situation (alone, family, other)?  If you are physically unable to care for yourself, who would you call on to help you?  Lives w daughter who works evenings primarily.  Sister lives 1.5 hours away.  Has close friends who will babysit granddaughter who lives w them.  Friends at work are stepping up to help as well.   - Support system:  What does your support system look like?  Who would you call on if you needed some kind of practical help?  What if you needed someone to talk to for emotional support?  See above.  55 year old granddaughter is "not doing well at all."  Asking "why did God pick you to have cancer, she is really scared."   - Finances:  Are you concerned about finances.  Considering returning to work?  If not, applying for disability?  Unable to work at this time.  Last day of work was approx two weeks ago, income.  Lives w daughter who will help contribute minimally to expenses. Pays lot rent and is purchasing mobile home.  Has short term disability through her work, applied yesterday for this.  Daughter will be applying for Food Stamps shortly.  Will also   What is your understanding of where you are with your cancer? Its cause?  Your treatment plan and what happens next?  Starting chemotherapy for reocurrance.  Was not expecting advance in cancer - surgery in March and scans in May did not show disease progression.  Found out in mid November - was in store and  "broke out in cold sweat, felt fluid/gas build up in abdomen, laid in bed 3 days, hoping/praying it would go away...went to ED and was diagnosed."  Has not been told current stage of cancer - has CT scan in chest on Monday, may get staging at that time.  Thought surgery in March had "gotten it all", was not expecting cancer again. "I know cancer and I know it can come back."  However, was not expecting such a quick reoccurrence/possible progression of disease.    What are your worries for the future as you begin treatment for cancer?  Worried about daughter and granddaughter, "she just lost her dad 8 years ago from cancer, this is devastating for her."  Daughter relocated to [place] 2.5 years ago, has 2 brothers in [place].  Very isolated here in [place].  Will be linked w [facility] for ongoing counseling and support.  What are your hopes and priorities during your treatment? What is important to you? What are your goals for your care?  Loves to be outside, hikes w granddaughter, walks in park.  Wants to be able to continue to care for granddaughter - very important to her to be able to relate to family.     CSW Summary:  Patient and family psychosocial functioning including strengths, limitations, and coping skills:  New disease progression possible, had surgery for Stage I cancer in  March 2020.  Within last two weeks, has had symptoms indicating progression and is to start chemotherapy at [facility] shortly.  Very anxious about disease progression and chemotherapy - has not had chemo in the past.  Now unable to work - family depends on both her income and her daughters income to pay bills.  No insurance at this time.  Has applied for limited short term disability benefits through employer yesterday.  Close to her daughter and especially 59 year old granddaughter.  Daughter's father died of cancer 8 years ago (daughter is 72), thus this diagnosis of cancer in mother is especially devastating.  Granddaughter  is very close to patient and is very worried about patient.  Patient has no income at present, is uninsured.  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  Identifications of barriers to care: No income, no insurance.  Availability of community resources:  Refer to [employer], [employer], Duanne Limerick, [place] ([place]).  Advised to wait on Medicaid application so her income documents reflect the fact that she is unable to work.    Clinical Social Worker follow up needed: Yes.    Will follow up by phone in two weeks to assess progress.  Edwyna Shell, LCSW Clinical Social Worker Phone:  [phone] Cell:  [phone]

## 2018-12-27 ENCOUNTER — Ambulatory Visit (HOSPITAL_COMMUNITY): Payer: Self-pay

## 2018-12-27 ENCOUNTER — Other Ambulatory Visit (HOSPITAL_COMMUNITY): Payer: Self-pay | Admitting: *Deleted

## 2018-12-27 ENCOUNTER — Inpatient Hospital Stay (HOSPITAL_COMMUNITY): Payer: Self-pay

## 2018-12-27 ENCOUNTER — Encounter (HOSPITAL_COMMUNITY): Payer: Self-pay | Admitting: *Deleted

## 2018-12-27 ENCOUNTER — Telehealth: Payer: Self-pay

## 2018-12-27 ENCOUNTER — Ambulatory Visit (HOSPITAL_COMMUNITY)
Admission: RE | Admit: 2018-12-27 | Discharge: 2018-12-27 | Disposition: A | Discharge status: Home / Self Care | Payer: Self-pay | Source: Ambulatory Visit | Attending: Gynecologic Oncology | Admitting: Gynecologic Oncology

## 2018-12-27 ENCOUNTER — Other Ambulatory Visit: Payer: Self-pay | Admitting: Student

## 2018-12-27 ENCOUNTER — Other Ambulatory Visit: Payer: Self-pay

## 2018-12-27 DIAGNOSIS — Z95828 Presence of other vascular implants and grafts: Secondary | ICD-10-CM

## 2018-12-27 DIAGNOSIS — C786 Secondary malignant neoplasm of retroperitoneum and peritoneum: Secondary | ICD-10-CM | POA: Insufficient documentation

## 2018-12-27 DIAGNOSIS — C541 Malignant neoplasm of endometrium: Secondary | ICD-10-CM

## 2018-12-27 MED ORDER — IOHEXOL 300 MG/ML  SOLN
75.0000 mL | Freq: Once | INTRAMUSCULAR | Status: AC | PRN
  Administered 2018-12-27: 75 mL via INTRAVENOUS

## 2018-12-27 MED ORDER — OXYCODONE-ACETAMINOPHEN 5-325 MG PO TABS: 1.0000 | ORAL_TABLET | Freq: Three times a day (TID) | ORAL | 0 refills | Status: DC | PRN

## 2018-12-27 MED ORDER — DICYCLOMINE HCL 20 MG PO TABS: 20.0000 mg | ORAL_TABLET | Freq: Four times a day (QID) | ORAL | 1 refills | Status: DC

## 2018-12-27 MED FILL — ?ATORVASTATIN 20 MG TABLET: 20 | 30 days supply | Qty: 30 | Fill #7

## 2018-12-27 NOTE — Progress Notes (Signed)
Patient arrived at the [facility] to have foley catheter placed.  Patient was positioned in supine position.  Kathie Rhodes, RN placed catheter with sterile technique.  Patient was provided education on perineal care for home.  Patient's catheter was attached to a leg bag and attached securely to her leg.  Patient discharged ambulatory and in stable condition.

## 2018-12-27 NOTE — Telephone Encounter (Signed)
Pt. Eligibility for [facility] is 12/27/2018 till 12/27/2019. Faxed over pt. [facility] and supporting docs.   Jessica [name]

## 2018-12-27 NOTE — Progress Notes (Signed)
Patient's daughter called the clinic this morning. She reports that her mom was in excruciating pain this morning after waking. She has to urinate and is unable to do so.  She hurts more when she stands.  She has not taken any of her pain medications.  I advised her to try and take her pain medication.    I spoke with Dr. Delton Coombes and he gave orders to try and get patient a catheter placed with leg bag.    I have arranged that to be done and I called patient.  She reports that after getting her pain medication in and being able to urinate a small amount, her pain is significantly decreased.  She is refusing catheter placement at this time. I advised her that if she needs to call us back, we can get this arranged for her.  She verbalizes understanding.

## 2018-12-27 NOTE — Progress Notes (Signed)
Chemotherapy education packet given and discussed with pt and family in detail.  Discussed diagnosis and staging, tx regimen, and intent of tx.  Reviewed chemotherapy medications and side effects, as well as pre-medications.  Instructed on how to manage side effects at home, and when to call the clinic.  Importance of fever/chills discussed with pt and family. Discussed precautions to implement at home after receiving tx, as well as self care strategies. Phone numbers provided for clinic during regular working hours, also how to reach the clinic after hours and on weekends. Pt and family provided the opportunity to ask questions - all questions answered to pt's and family satisfaction. Consent obtained at this time.    

## 2018-12-28 ENCOUNTER — Inpatient Hospital Stay (HOSPITAL_COMMUNITY): Payer: Self-pay | Admitting: General Practice

## 2018-12-28 ENCOUNTER — Other Ambulatory Visit (HOSPITAL_COMMUNITY): Payer: Self-pay | Admitting: General Practice

## 2018-12-28 ENCOUNTER — Other Ambulatory Visit (HOSPITAL_COMMUNITY): Payer: Self-pay | Admitting: Hematology

## 2018-12-28 ENCOUNTER — Other Ambulatory Visit: Payer: Self-pay

## 2018-12-28 ENCOUNTER — Encounter (HOSPITAL_COMMUNITY): Payer: Self-pay | Admitting: Interventional Radiology

## 2018-12-28 ENCOUNTER — Ambulatory Visit (HOSPITAL_COMMUNITY)
Admission: RE | Admit: 2018-12-28 | Discharge: 2018-12-28 | Disposition: A | Discharge status: Home / Self Care | Payer: Self-pay | Source: Ambulatory Visit | Attending: Hematology | Admitting: Hematology

## 2018-12-28 ENCOUNTER — Encounter: Payer: Self-pay | Admitting: General Practice

## 2018-12-28 DIAGNOSIS — C541 Malignant neoplasm of endometrium: Secondary | ICD-10-CM

## 2018-12-28 DIAGNOSIS — Z79899 Other long term (current) drug therapy: Secondary | ICD-10-CM | POA: Insufficient documentation

## 2018-12-28 DIAGNOSIS — Z8542 Personal history of malignant neoplasm of other parts of uterus: Secondary | ICD-10-CM | POA: Insufficient documentation

## 2018-12-28 DIAGNOSIS — Z7901 Long term (current) use of anticoagulants: Secondary | ICD-10-CM | POA: Insufficient documentation

## 2018-12-28 HISTORY — PX: IR IMAGING GUIDED PORT INSERTION: IMG5740

## 2018-12-28 LAB — CBC
HCT: 35.5 % — ABNORMAL LOW (ref 36.0–46.0)
Hemoglobin: 11.1 g/dL — ABNORMAL LOW (ref 12.0–15.0)
MCH: 27.1 pg (ref 26.0–34.0)
MCHC: 31.3 g/dL (ref 30.0–36.0)
MCV: 86.8 fL (ref 80.0–100.0)
Platelets: 464 10*3/uL — ABNORMAL HIGH (ref 150–400)
RBC: 4.09 MIL/uL (ref 3.87–5.11)
RDW: 13.1 % (ref 11.5–15.5)
WBC: 8.5 10*3/uL (ref 4.0–10.5)
nRBC: 0 % (ref 0.0–0.2)

## 2018-12-28 LAB — PROTIME-INR
INR: 1 (ref 0.8–1.2)
Prothrombin Time: 13.2 seconds (ref 11.4–15.2)

## 2018-12-28 MED ORDER — CEFAZOLIN SODIUM-DEXTROSE 2-4 GM/100ML-% IV SOLN
INTRAVENOUS | Status: AC
  Filled 2018-12-28: qty 100

## 2018-12-28 MED ORDER — LIDOCAINE HCL 1 % IJ SOLN
INTRAMUSCULAR | Status: AC
  Filled 2018-12-28: qty 20

## 2018-12-28 MED ORDER — FENTANYL CITRATE (PF) 100 MCG/2ML IJ SOLN
INTRAMUSCULAR | Status: AC
  Filled 2018-12-28: qty 4

## 2018-12-28 MED ORDER — CEFAZOLIN SODIUM-DEXTROSE 2-4 GM/100ML-% IV SOLN: 2.0000 g | Freq: Once | INTRAVENOUS | Status: DC

## 2018-12-28 MED ORDER — MIDAZOLAM HCL 2 MG/2ML IJ SOLN
INTRAMUSCULAR | Status: AC | PRN
  Administered 2018-12-28: 1 mg via INTRAVENOUS
  Administered 2018-12-28: 0.5 mg via INTRAVENOUS

## 2018-12-28 MED ORDER — MIDAZOLAM HCL 2 MG/2ML IJ SOLN
INTRAMUSCULAR | Status: AC | PRN
  Administered 2018-12-28 (×2): 0.5 mg via INTRAVENOUS

## 2018-12-28 MED ORDER — HEPARIN SOD (PORK) LOCK FLUSH 100 UNIT/ML IV SOLN
INTRAVENOUS | Status: DC | PRN
  Administered 2018-12-28: 500 [IU] via INTRAVENOUS

## 2018-12-28 MED ORDER — MIDAZOLAM HCL 2 MG/2ML IJ SOLN
INTRAMUSCULAR | Status: AC
  Filled 2018-12-28: qty 4

## 2018-12-28 MED ORDER — HEPARIN SOD (PORK) LOCK FLUSH 100 UNIT/ML IV SOLN
INTRAVENOUS | Status: AC
  Filled 2018-12-28: qty 5

## 2018-12-28 MED ORDER — SODIUM CHLORIDE 0.9 % IV SOLN: INTRAVENOUS | Status: DC

## 2018-12-28 MED ORDER — GELATIN ABSORBABLE 12-7 MM EX MISC
CUTANEOUS | Status: AC
  Filled 2018-12-28: qty 1

## 2018-12-28 MED ORDER — LIDOCAINE-EPINEPHRINE 1 %-1:100000 IJ SOLN
30.0000 mL | Freq: Once | INTRAMUSCULAR | Status: DC
  Filled 2018-12-28: qty 30

## 2018-12-28 MED ORDER — CEFAZOLIN (ANCEF) 1 G IV SOLR
INTRAVENOUS | Status: AC | PRN
  Administered 2018-12-28: 2 g

## 2018-12-28 MED ORDER — FENTANYL CITRATE (PF) 100 MCG/2ML IJ SOLN
INTRAMUSCULAR | Status: AC | PRN
  Administered 2018-12-28 (×2): 25 ug via INTRAVENOUS

## 2018-12-28 MED ORDER — FENTANYL CITRATE (PF) 100 MCG/2ML IJ SOLN
INTRAMUSCULAR | Status: AC | PRN
  Administered 2018-12-28: 25 ug via INTRAVENOUS
  Administered 2018-12-28: 50 ug via INTRAVENOUS

## 2018-12-28 MED ORDER — LIDOCAINE-EPINEPHRINE 1 %-1:100000 IJ SOLN
INTRAMUSCULAR | Status: DC | PRN
  Administered 2018-12-28: 10 mL

## 2018-12-28 MED ORDER — LIDOCAINE-EPINEPHRINE (PF) 1 %-1:200000 IJ SOLN
30.0000 mL | Freq: Once | INTRAMUSCULAR | Status: DC
  Filled 2018-12-28 (×2): qty 30

## 2018-12-28 NOTE — Progress Notes (Signed)
[facility] CSW Progress Notes  Call to patient to assess progress and linkage to needed resources.  Unable to reach patient, left VM requesting call back.  Per [facility], they are reaching out to patient to offer support/resources including linkage to PCP, resources for food insecurity and possible assistance.  Patient has been referred to [facility] for help w [place] is mailing paperwork for patient to sign and return - mailed to [facility].  Staff can either assist patient w needed signatures or give directly to patient for her to sign/return by mail to [facility].  [facility] is reaching out to patient to offer support/resources related to support for children of parents/grandparents diagnosed w cancer.  Duanne Limerick has been asked to reach out as well.  CSW will await return call from patient.  Edwyna Shell, LCSW Clinical Social Worker Phone:  [phone] Cell:  [phone]

## 2018-12-28 NOTE — H&P (Signed)
Chief Complaint: Patient was seen in consultation today for left omental mass biopsy and port placement.  Referring Physician(s): Katragadda,Sreedhar  Supervising Physician: Sandi Mariscal  Patient Status: [facility] - Out-pt  History of Present Illness: Jessica [name] is a 58 y.o. female with a past medical history significant for recurrent endometrial carcinosarcoma followed by Dr. Denman George and Dr. Delton Coombes who presents today for a left omental mass biopsy and port placement. Patient has a history of TAH, BSO and bilateral pelvic/para-aortic lymph node dissection on 04/13/18 with pathology notable for high-grade endometrial carcinosarcoma with no myometrial invasion. She was followed as an outpatient with regular imaging and as of October this year there was no evidence of recurrence noted. She presented to [facility] ED on 11/19 with complaints of abdominal bloating and pelvic pain - CT abd/pelvis showed extensive peritoneal carcinomatosis with a very aggressive appearing new soft tissue mass in the abdomen and pelvis. She was seen by Dr. Denman George on 11/24 and subsequently referred to oncology for adjuvant therapy. She was seen by Dr. Delton Coombes on 12/2 and a request was placed to IR for port placement + biopsy of the left omental mass from which she presents today.  Ms. Thornsbury reports ongoing abdominal and pelvic pain which is worse on the left side and has been fairly consistent since it began in November of this year. She has no other pain at this time. She has been eating and drinking well at home. She states understanding of requested procedures and wishes to proceed.   Past Medical History:  Diagnosis Date  . Elevated blood pressure reading   . Endometrial cancer ([place])   . PMB (postmenopausal bleeding)   . Port-A-Cath in place 12/24/2018    Past Surgical History:  Procedure Laterality Date  . IR RADIOLOGIST EVAL & MGMT  06/02/2018  . IR RADIOLOGIST EVAL & MGMT  06/16/2018  . NECK SURGERY  2000   spinal surgery , titanium rod in place   . ROBOTIC ASSISTED TOTAL HYSTERECTOMY WITH BILATERAL SALPINGO OOPHERECTOMY N/A 04/13/2018   Procedure: XI ROBOTIC ASSISTED TOTAL HYSTERECTOMY WITH BILATERAL SALPINGO OOPHORECTOMY;  Surgeon: Everitt Amber, MD;  Location: [facility] ORS;  Service: Gynecology;  Laterality: N/A;  . ROBOTIC PELVIC AND PARA-AORTIC LYMPH NODE DISSECTION N/A 04/13/2018   Procedure: XI ROBOTIC PELVIC LYMPHADECTOMY AND PARA-AORTIC LYMPH NODE DISSECTION;  Surgeon: Everitt Amber, MD;  Location: [facility] ORS;  Service: Gynecology;  Laterality: N/A;    Allergies: Patient has no known allergies.  Medications: Prior to Admission medications   Medication Sig Start Date End Date Taking? Authorizing Provider  amLODipine (NORVASC) 5 MG tablet Take 1 tablet (5 mg total) by mouth daily. 04/26/18   Elsie Stain, MD  atorvastatin (LIPITOR) 20 MG tablet Take 1 tablet (20 mg total) by mouth daily. 04/27/18   Elsie Stain, MD  CARBOPLATIN IV Inject into the vein every 21 ( twenty-one) days. 12/29/18   [provider]  dicyclomine (BENTYL) 20 MG tablet Take 1 tablet (20 mg total) by mouth every 6 (six) hours. 12/27/18   [name], MD  lidocaine-prilocaine (EMLA) cream Apply small amount to port a cath site and cover with plastic wrap one hour prior to chemotherapy appointments 12/24/18   [name], MD  oxyCODONE-acetaminophen (PERCOCET) 5-325 MG tablet Take 1 tablet by mouth every 8 (eight) hours as needed (pain). 12/27/18   [name], MD  PACLITAXEL IV Inject into the vein every 21 ( twenty-one) days. 12/29/18   [provider]  polyethylene glycol (  MIRALAX / GLYCOLAX) 17 g packet Take 17 g by mouth 2 (two) times daily as needed.    [provider]  prochlorperazine (COMPAZINE) 10 MG tablet Take 1 tablet (10 mg total) by mouth every 6 (six) hours as needed (Nausea or vomiting). 12/24/18   [name], MD     Family History  Problem Relation Age  of Onset  . Hypertension Father   . Heart disease Father   . Cancer Father        lung  . Hypertension Mother     Social History   Socioeconomic History  . Marital status: Widowed    Spouse name: Not on file  . Number of children: Not on file  . Years of education: Not on file  . Highest education level: Not on file  Occupational History  . Not on file  Social Needs  . Financial resource strain: Not on file  . Food insecurity    Worry: Never true    Inability: Never true  . Transportation needs    Medical: No    Non-medical: No  Tobacco Use  . Smoking status: Never Smoker  . Smokeless tobacco: Never Used  Substance and Sexual Activity  . Alcohol use: Yes    Alcohol/week: 4.0 standard drinks    Types: 4 Glasses of wine per week    Comment: weekends  . Drug use: Never  . Sexual activity: Not Currently  Lifestyle  . Physical activity    Days per week: Not on file    Minutes per session: Not on file  . Stress: Not on file  Relationships  . Social Herbalist on phone: Not on file    Gets together: Not on file    Attends religious service: Not on file    Active member of club or organization: Not on file    Attends meetings of clubs or organizations: Not on file    Relationship status: Not on file  Other Topics Concern  . Not on file  Social History Narrative  . Not on file     Review of Systems: A 12 point ROS discussed and pertinent positives are indicated in the HPI above.  All other systems are negative.  Review of Systems  Constitutional: Negative for appetite change, chills and fever.  HENT: Negative for nosebleeds.   Respiratory: Negative for cough and shortness of breath.   Cardiovascular: Negative for chest pain.  Gastrointestinal: Positive for abdominal distention and abdominal pain. Negative for blood in stool, constipation, diarrhea, nausea and vomiting.  Genitourinary: Negative for hematuria.  Musculoskeletal: Negative for back pain.   Skin: Negative for rash and wound.  Neurological: Negative for dizziness and headaches.    Vital Signs: BP 137/80   Pulse 72   Temp 98.1 F (36.7 C) (Skin)   Resp 18   Ht 5\' 2"  (1.575 m)   Wt 211 lb (95.7 kg)   SpO2 98%   BMI 38.59 kg/m   Physical Exam Vitals signs reviewed.  Constitutional:      General: She is not in acute distress. HENT:     Head: Normocephalic.     Mouth/Throat:     Mouth: Mucous membranes are moist.     Pharynx: Oropharynx is clear. No oropharyngeal exudate or posterior oropharyngeal erythema.  Cardiovascular:     Rate and Rhythm: Normal rate and regular rhythm.  Pulmonary:     Effort: Pulmonary effort is normal.     Breath sounds: Normal  breath sounds.  Abdominal:     General: There is distension.     Tenderness: There is abdominal tenderness.  Skin:    General: Skin is warm and dry.  Neurological:     Mental Status: She is alert and oriented to person, place, and time.  Psychiatric:        Mood and Affect: Mood normal.        Behavior: Behavior normal.        Thought Content: Thought content normal.        Judgment: Judgment normal.      MD Evaluation Airway: WNL Heart: WNL Abdomen: WNL Chest/ Lungs: WNL ASA  Classification: 2 Mallampati/Airway Score: Two   Imaging: Ct Chest W Contrast  Result Date: 12/27/2018 CLINICAL DATA:  Endometrial cancer with peritoneal carcinomatosis. EXAM: CT CHEST WITH CONTRAST TECHNIQUE: Multidetector CT imaging of the chest was performed during intravenous contrast administration. CONTRAST:  3mL OMNIPAQUE IOHEXOL 300 MG/ML  SOLN COMPARISON:  04/02/2018. FINDINGS: Cardiovascular: Atherosclerotic calcification of the aorta. Heart is at the upper limits of normal in size to mildly enlarged. No pericardial effusion. Mediastinum/Nodes: Mediastinal lymph nodes are not enlarged by CT size criteria. No hilar or axillary adenopathy. Esophagus is grossly unremarkable. Lungs/Pleura: Mild paraspinal scarring in the  medial right lower lobe. Lungs are otherwise clear. No pleural fluid. Airway is unremarkable. Upper Abdomen: Visualized portions of the liver, adrenal glands, left kidney, spleen, pancreas, stomach and bowel are unremarkable with exception of a small hiatal hernia. Upper abdominal lymph nodes are not enlarged by CT size criteria. Perihepatic and perisplenic ascites. Partially imaged nodule along the right liver capsule measures 2.1 cm (2/140), new from 04/02/2018. Musculoskeletal: Degenerative changes in the spine. No worrisome lytic or sclerotic lesions. IMPRESSION: 1. No evidence of metastatic disease in the chest. 2. Ascites with a partially imaged nodule along the right hepatic lobe margin, indicative of a peritoneal implant. 3.  Aortic atherosclerosis (ICD10-170.0). Electronically Signed   By: Lorin Picket M.D.   On: 12/27/2018 16:54   Ct Abdomen Pelvis W Contrast  Result Date: 12/09/2018 CLINICAL DATA:  Abdominal pain and pressure for 4 days. No nausea. History of pelvic abscesses. EXAM: CT ABDOMEN AND PELVIS WITH CONTRAST TECHNIQUE: Multidetector CT imaging of the abdomen and pelvis was performed using the standard protocol following bolus administration of intravenous contrast. CONTRAST:  127mL OMNIPAQUE IOHEXOL 300 MG/ML  SOLN COMPARISON:  CT scan 06/02/2018 FINDINGS: Lower chest: The lung bases are clear of an acute process. No pulmonary lesions are identified. The heart is normal in size. No pericardial effusion. There is a moderate size hiatal hernia noted. Hepatobiliary: No focal hepatic lesions or intrahepatic biliary dilatation. Moderate amount of perihepatic fluid is noted. The gallbladder contains numerous gallstones but no definite findings for acute cholecystitis. No common bile duct dilatation. Pancreas: No mass, inflammation or ductal dilatation. Spleen: Normal size.  No focal lesions.  Moderate perisplenic fluid. Adrenals/Urinary Tract: The adrenal glands and kidneys are unremarkable.  No renal, ureteral or bladder calculi or mass. Stomach/Bowel: The stomach, duodenum, small bowel and colon are grossly normal. No acute inflammatory changes, mass lesions or obstructive findings. The terminal ileum is normal. The appendix is normal. Vascular/Lymphatic: The aorta and branch vessels are patent. No aneurysm or dissection. Stable ostial calcifications at the main branch vessels. Small scattered mesenteric and retroperitoneal lymph nodes are noted. Unfortunately, there is new extensive peritoneal carcinomatosis with very aggressive appearing new soft tissue masses in the abdomen and pelvis. There is a large  conglomerate partially enhancing soft tissue mass in the pelvis measuring approximately 12 x 12 x 10 cm and most concerning for recurrent endometrial carcinoma. There is also a left-sided lower abdominal mass along the omentum measuring approximately 6.5 x 6.0 x 4.8 cm. Associated small volume abdominal/pelvic ascites. I do not see any disease on the prior CT scan from May 2020. Reproductive: Surgically absent. Other: No abdominal wall hernia or subcutaneous lesions. Musculoskeletal: The bony structures are unremarkable. IMPRESSION: 1. Fairly extensive aggressive and new abdominal/pelvic carcinomatosis consistent with recurrent endometrial cancer. 2. Associated small volume abdominal/pelvic ascites 3. No findings for bowel involvement or bowel obstruction. 4. Cholelithiasis. Electronically Signed   By: Marijo Sanes M.D.   On: 12/09/2018 15:23    Labs:  CBC: Recent Labs    05/28/18 0722 12/09/18 1336 12/22/18 1416 12/28/18 0928  WBC 8.0 12.0* 9.3 8.5  HGB 12.9 12.3 12.4 11.1*  HCT 40.7 38.7 39.8 35.5*  PLT 383 381 564* 464*    COAGS: Recent Labs    05/28/18 0722  INR 1.0  APTT 30    BMP: Recent Labs    05/03/18 1215 05/05/18 1312 12/09/18 1336 12/22/18 1416  NA 138 141 140 139  K 3.6 4.3 3.8 4.0  CL 104 104 104 105  CO2 25 25 24 23   GLUCOSE 115* 98 96 103*  BUN 5*  8 9 9   CALCIUM 9.1 9.6 9.2 9.2  CREATININE 0.53 0.65 0.45 0.52  GFRNONAA >60 >60 >60 >60  GFRAA >60 >60 >60 >60    LIVER FUNCTION TESTS: Recent Labs    05/03/18 1215 05/05/18 1312 12/09/18 1336 12/22/18 1416  BILITOT 0.5 0.2* 0.8 0.4  AST 67* 26 76* 17  ALT 122* 88* 75* 17  ALKPHOS 212* 210* 160* 120  PROT 6.8 7.7 7.6 7.4  ALBUMIN 3.3* 3.3* 3.8 3.6    TUMOR MARKERS: No results for input(s): AFPTM, CEA, CA199, CHROMGRNA in the last 8760 hours.  Assessment and Plan:  58 y/o F with history of endometrial carcinosarcoma s/p TAH, BSO and bilateral pelvic/para-aortic lymph node dissection (04/13/18) with Dr. Denman George who presented to [facility] ED on 12/09/18 with pelvic pain and abdominal bloating - CT abd/pelvis showed extensive peritoneal carcinomatosis with a very aggressive appearing new soft tissue mass in the abdomen and pelvis. She was referred to oncology and decision was made to proceed with left omental mass biopsy and port placement today in IR.  Patient has been NPO since midnight, she does not take blood thinning medications. Afebrile, WBC 8.5, hgb 11.1, plt 464, INR 1.0.  Risks and benefits of omental mass biopsy was discussed with the patient and/or patient's family including, but not limited to bleeding, infection, damage to adjacent structures or low yield requiring additional tests.  Risks and benefits of image guided port-a-catheter placement were discussed with the patient including, but not limited to bleeding, infection, pneumothorax, or fibrin sheath development and need for additional procedures.  All of the patient's questions were answered, patient is agreeable to proceed.  Consent signed and in chart.  Thank you for this interesting consult.  I greatly enjoyed meeting Jessica [name] and look forward to participating in their care.  A copy of this report was sent to the requesting provider on this date.  Electronically Signed: Joaquim Nam, PA-C 12/28/2018,  12:42 PM   I spent a total of  40 Minutes in face to face in clinical consultation, greater than 50% of which was counseling/coordinating care for omental mass  biopsy and port placement.

## 2018-12-28 NOTE — Procedures (Signed)
Pre procedural Dx: Endometrial Carcinosarcoma  Post procedural Dx: Same  Technically successful CT guided biopsy of dominant omental mass within the left side of the abdomen.   EBL: Trace Complications: None immediate.   Ronny Bacon, MD Pager #: [phone]

## 2018-12-28 NOTE — Discharge Instructions (Signed)
Implanted Port Insertion, Care After °This sheet gives you information about how to care for yourself after your procedure. Your health care provider may also give you more specific instructions. If you have problems or questions, contact your health care provider. °What can I expect after the procedure? °After the procedure, it is common to have: °· Discomfort at the port insertion site. °· Bruising on the skin over the port. This should improve over 3-4 days. °Follow these instructions at home: °Port care °· After your port is placed, you will get a manufacturer's information card. The card has information about your port. Keep this card with you at all times. °· Take care of the port as told by your health care provider. Ask your health care provider if you or a family member can get training for taking care of the port at home. A home health care nurse may also take care of the port. °· Make sure to remember what type of port you have. °Incision care ° °  ° °· Follow instructions from your health care provider about how to take care of your port insertion site. Make sure you: °? Wash your hands with soap and water before and after you change your bandage (dressing). If soap and water are not available, use hand sanitizer. °? Change your dressing as told by your health care provider. °? Leave stitches (sutures), skin glue, or adhesive strips in place. These skin closures may need to stay in place for 2 weeks or longer. If adhesive strip edges start to loosen and curl up, you may trim the loose edges. Do not remove adhesive strips completely unless your health care provider tells you to do that. °· Check your port insertion site every day for signs of infection. Check for: °? Redness, swelling, or pain. °? Fluid or blood. °? Warmth. °? Pus or a bad smell. °Activity °· Return to your normal activities as told by your health care provider. Ask your health care provider what activities are safe for you. °· Do not  lift anything that is heavier than 10 lb (4.5 kg), or the limit that you are told, until your health care provider says that it is safe. °General instructions °· Take over-the-counter and prescription medicines only as told by your health care provider. °· Do not take baths, swim, or use a hot tub until your health care provider approves. Ask your health care provider if you may take showers. You may only be allowed to take sponge baths. °· Do not drive for 24 hours if you were given a sedative during your procedure. °· Wear a medical alert bracelet in case of an emergency. This will tell any health care providers that you have a port. °· Keep all follow-up visits as told by your health care provider. This is important. °Contact a health care provider if: °· You cannot flush your port with saline as directed, or you cannot draw blood from the port. °· You have a fever or chills. °· You have redness, swelling, or pain around your port insertion site. °· You have fluid or blood coming from your port insertion site. °· Your port insertion site feels warm to the touch. °· You have pus or a bad smell coming from the port insertion site. °Get help right away if: °· You have chest pain or shortness of breath. °· You have bleeding from your port that you cannot control. °Summary °· Take care of the port as told by your health   care provider. Keep the manufacturer's information card with you at all times.  Change your dressing as told by your health care provider.  Contact a health care provider if you have a fever or chills or if you have redness, swelling, or pain around your port insertion site.  Keep all follow-up visits as told by your health care provider. This information is not intended to replace advice given to you by your health care provider. Make sure you discuss any questions you have with your health care provider. Document Released: 10/27/2012 Document Revised: 08/04/2017 Document Reviewed:  08/04/2017 Elsevier Patient Education  [place].  Needle Biopsy, Care After These instructions tell you how to care for yourself after your procedure. Your doctor may also give you more specific instructions. Call your doctor if you have any problems or questions. What can I expect after the procedure? After the procedure, it is common to have:  Soreness.  Bruising.  Mild pain. Follow these instructions at home:   Return to your normal activities as told by your doctor. Ask your doctor what activities are safe for you.  Take over-the-counter and prescription medicines only as told by your doctor.  Wash your hands with soap and water before you change your bandage (dressing). If you cannot use soap and water, use hand sanitizer.  Follow instructions from your doctor about: ? How to take care of your puncture site. ? When and how to change your bandage. ? When to remove your bandage.  Check your puncture site every day for signs of infection. Watch for: ? Redness, swelling, or pain. ? Fluid or blood. ? Pus or a bad smell. ? Warmth.  Do not take baths, swim, or use a hot tub until your doctor approves. Ask your doctor if you may take showers. You may only be allowed to take sponge baths.  Keep all follow-up visits as told by your doctor. This is important. Contact a doctor if you have:  A fever.  Redness, swelling, or pain at the puncture site, and it lasts longer than a few days.  Fluid, blood, or pus coming from the puncture site.  Warmth coming from the puncture site. Get help right away if:  You have a lot of bleeding from the puncture site. Summary  After the procedure, it is common to have soreness, bruising, or mild pain at the puncture site.  Check your puncture site every day for signs of infection, such as redness, swelling, or pain.  Get help right away if you have severe bleeding from your puncture site. This information is not intended to  replace advice given to you by your health care provider. Make sure you discuss any questions you have with your health care provider. Document Released: 12/20/2007 Document Revised: 01/19/2017 Document Reviewed: 01/19/2017 Elsevier Patient Education  2020 [employer].

## 2018-12-28 NOTE — Procedures (Signed)
Pre Procedure Dx: Poor venous access Post Procedural Dx: Same  Successful placement of right IJ approach port-a-cath with tip at the superior caval atrial junction. The catheter is ready for immediate use.  Estimated Blood Loss: Minimal  Complications: None immediate.  Jay Chiara Coltrin, MD Pager #: [phone]   

## 2018-12-29 ENCOUNTER — Inpatient Hospital Stay (HOSPITAL_COMMUNITY): Payer: Self-pay

## 2018-12-29 ENCOUNTER — Inpatient Hospital Stay (HOSPITAL_BASED_OUTPATIENT_CLINIC_OR_DEPARTMENT_OTHER): Payer: Self-pay | Admitting: Hematology

## 2018-12-29 ENCOUNTER — Encounter (HOSPITAL_COMMUNITY): Payer: Self-pay

## 2018-12-29 ENCOUNTER — Encounter (HOSPITAL_COMMUNITY): Payer: Self-pay | Admitting: Hematology

## 2018-12-29 ENCOUNTER — Other Ambulatory Visit: Payer: Self-pay

## 2018-12-29 VITALS — BP 134/59 | HR 86 | Temp 97.3°F | Resp 18 | Wt 211.8 lb

## 2018-12-29 DIAGNOSIS — Z95828 Presence of other vascular implants and grafts: Secondary | ICD-10-CM

## 2018-12-29 DIAGNOSIS — C541 Malignant neoplasm of endometrium: Secondary | ICD-10-CM

## 2018-12-29 LAB — CBC WITH DIFFERENTIAL/PLATELET
Abs Immature Granulocytes: 0.04 10*3/uL (ref 0.00–0.07)
Basophils Absolute: 0.1 10*3/uL (ref 0.0–0.1)
Basophils Relative: 1 %
Eosinophils Absolute: 0 10*3/uL (ref 0.0–0.5)
Eosinophils Relative: 0 %
HCT: 33 % — ABNORMAL LOW (ref 36.0–46.0)
Hemoglobin: 10.5 g/dL — ABNORMAL LOW (ref 12.0–15.0)
Immature Granulocytes: 0 %
Lymphocytes Relative: 12 %
Lymphs Abs: 1.2 10*3/uL (ref 0.7–4.0)
MCH: 27.7 pg (ref 26.0–34.0)
MCHC: 31.8 g/dL (ref 30.0–36.0)
MCV: 87.1 fL (ref 80.0–100.0)
Monocytes Absolute: 0.9 10*3/uL (ref 0.1–1.0)
Monocytes Relative: 9 %
Neutro Abs: 7.8 10*3/uL — ABNORMAL HIGH (ref 1.7–7.7)
Neutrophils Relative %: 78 %
Platelets: 417 10*3/uL — ABNORMAL HIGH (ref 150–400)
RBC: 3.79 MIL/uL — ABNORMAL LOW (ref 3.87–5.11)
RDW: 13.2 % (ref 11.5–15.5)
WBC: 10 10*3/uL (ref 4.0–10.5)
nRBC: 0 % (ref 0.0–0.2)

## 2018-12-29 LAB — COMPREHENSIVE METABOLIC PANEL
ALT: 15 U/L (ref 0–44)
AST: 16 U/L (ref 15–41)
Albumin: 3.3 g/dL — ABNORMAL LOW (ref 3.5–5.0)
Alkaline Phosphatase: 96 U/L (ref 38–126)
Anion gap: 11 (ref 5–15)
BUN: 8 mg/dL (ref 6–20)
CO2: 22 mmol/L (ref 22–32)
Calcium: 8.6 mg/dL — ABNORMAL LOW (ref 8.9–10.3)
Chloride: 104 mmol/L (ref 98–111)
Creatinine, Ser: 0.5 mg/dL (ref 0.44–1.00)
GFR calc Af Amer: >60 mL/min (ref >60)
GFR calc non Af Amer: >60 mL/min (ref >60)
Glucose, Bld: 183 mg/dL — ABNORMAL HIGH (ref 70–99)
Potassium: 3.7 mmol/L (ref 3.5–5.1)
Sodium: 137 mmol/L (ref 135–145)
Total Bilirubin: 0.5 mg/dL (ref 0.3–1.2)
Total Protein: 6.5 g/dL (ref 6.5–8.1)

## 2018-12-29 MED ORDER — SODIUM CHLORIDE 0.9 % IV SOLN
862.8000 mg | Freq: Once | INTRAVENOUS | Status: AC
  Administered 2018-12-29: 860 mg via INTRAVENOUS
  Filled 2018-12-29: qty 86

## 2018-12-29 MED ORDER — SODIUM CHLORIDE 0.9% FLUSH
10.0000 mL | INTRAVENOUS | Status: DC | PRN
  Administered 2018-12-29: 10 mL
  Filled 2018-12-29: qty 10

## 2018-12-29 MED ORDER — PALONOSETRON HCL INJECTION 0.25 MG/5ML
0.2500 mg | Freq: Once | INTRAVENOUS | Status: AC
  Administered 2018-12-29: 12:00:00 0.25 mg via INTRAVENOUS
  Filled 2018-12-29: qty 5

## 2018-12-29 MED ORDER — SODIUM CHLORIDE 0.9 % IV SOLN
Freq: Once | INTRAVENOUS | Status: AC
  Administered 2018-12-29: 12:00:00 via INTRAVENOUS
  Filled 2018-12-29: qty 5

## 2018-12-29 MED ORDER — SODIUM CHLORIDE 0.9 % IV SOLN
150.0000 mg | Freq: Once | INTRAVENOUS | Status: DC
  Filled 2018-12-29: qty 5

## 2018-12-29 MED ORDER — SODIUM CHLORIDE 0.9 % IV SOLN
10.0000 mg | Freq: Once | INTRAVENOUS | Status: DC
  Filled 2018-12-29: qty 1

## 2018-12-29 MED ORDER — LACTULOSE 20 GM/30ML PO SOLN: 30.0000 mL | Freq: Once | ORAL | 0 refills | Status: AC

## 2018-12-29 MED ORDER — SODIUM CHLORIDE 0.9 % IV SOLN
Freq: Once | INTRAVENOUS | Status: AC
  Administered 2018-12-29: 12:00:00 via INTRAVENOUS

## 2018-12-29 MED ORDER — HEPARIN SOD (PORK) LOCK FLUSH 100 UNIT/ML IV SOLN
500.0000 [IU] | Freq: Once | INTRAVENOUS | Status: AC | PRN
  Administered 2018-12-29: 500 [IU]

## 2018-12-29 MED ORDER — DIPHENHYDRAMINE HCL 50 MG/ML IJ SOLN
50.0000 mg | Freq: Once | INTRAMUSCULAR | Status: AC
  Administered 2018-12-29: 50 mg via INTRAVENOUS
  Filled 2018-12-29: qty 1

## 2018-12-29 MED ORDER — SODIUM CHLORIDE 0.9 % IV SOLN
175.0000 mg/m2 | Freq: Once | INTRAVENOUS | Status: AC
  Administered 2018-12-29: 13:00:00 360 mg via INTRAVENOUS
  Filled 2018-12-29: qty 60

## 2018-12-29 MED ORDER — FAMOTIDINE IN NACL 20-0.9 MG/50ML-% IV SOLN
20.0000 mg | Freq: Once | INTRAVENOUS | Status: AC
  Administered 2018-12-29: 20 mg via INTRAVENOUS
  Filled 2018-12-29: qty 50

## 2018-12-29 NOTE — Progress Notes (Signed)
Jessica [name], [place] [id number]   CLINIC:  Medical Oncology/Hematology  PCP:  Patient, No Pcp Per No address on file None   REASON FOR VISIT:  Follow-up for recurrent endometrial carcinosarcoma.  CURRENT THERAPY: Carboplatin and paclitaxel every 3 weeks.  BRIEF ONCOLOGIC HISTORY:  Oncology History  Endometrial cancer ([place])  03/31/2018 Initial Diagnosis   Endometrial cancer ([place])   12/29/2018 -  Chemotherapy   The patient had palonosetron (ALOXI) injection 0.25 mg, 0.25 mg, Intravenous,  Once, 1 of 6 cycles Administration: 0.25 mg (12/29/2018) pegfilgrastim-cbqv (UDENYCA) injection 6 mg, 6 mg, Subcutaneous, Once, 1 of 6 cycles Administration: 6 mg (12/31/2018) CARBOplatin (PARAPLATIN) 860 mg in sodium chloride 0.9 % 500 mL chemo infusion, 860 mg (100 % of original dose 862.8 mg), Intravenous,  Once, 1 of 6 cycles Dose modification:   (original dose 862.8 mg, Cycle 1) Administration: 860 mg (12/29/2018) fosaprepitant (EMEND) 150 mg in sodium chloride 0.9 % 145 mL IVPB, 150 mg, Intravenous,  Once, 1 of 6 cycles PACLitaxel (TAXOL) 360 mg in sodium chloride 0.9 % 500 mL chemo infusion (> 70m/m2), 175 mg/m2 = 360 mg, Intravenous,  Once, 1 of 6 cycles Administration: 360 mg (12/29/2018)  for chemotherapy treatment.       CANCER STAGING: Cancer Staging No matching staging information was found for the patient.   INTERVAL HISTORY:  [name]. female seen for follow-up of endometrial carcinosarcoma recurrence.  Pain in the abdomen is reported as 7 out of 10.  She is taking Percocet sparingly because she gets constipated.  Appetite and energy levels are 50%.  She had CT scan of the chest done.  She also had biopsy of the omental lesion done.  Denies any nausea, vomiting or diarrhea.    REVIEW OF SYSTEMS:  Review of Systems  Gastrointestinal: Positive for abdominal pain and constipation.  All other systems reviewed and are negative.     PAST MEDICAL/SURGICAL HISTORY:  Past Medical History:  Diagnosis Date  . Elevated blood pressure reading   . [place]   . PMB (postmenopausal bleeding)   . Port-A-Cath in place 12/24/2018   Past Surgical History:  Procedure Laterality Date  . IR IMAGING GUIDED PORT INSERTION  12/28/2018  . IR RADIOLOGIST EVAL & MGMT  06/02/2018  . IR RADIOLOGIST EVAL & MGMT  06/16/2018  . NECK SURGERY  2000   spinal surgery , titanium rod in place   . ROBOTIC ASSISTED TOTAL HYSTERECTOMY WITH BILATERAL SALPINGO OOPHERECTOMY N/A 04/13/2018   Procedure: XI ROBOTIC ASSISTED TOTAL HYSTERECTOMY WITH BILATERAL SALPINGO OOPHORECTOMY;  Surgeon: REveritt Amber MD;  Location: [facility];  Service: Gynecology;  Laterality: N/A;  . ROBOTIC PELVIC AND PARA-AORTIC LYMPH NODE DISSECTION N/A 04/13/2018   Procedure: XI ROBOTIC PELVIC LYMPHADECTOMY AND PARA-AORTIC LYMPH NODE DISSECTION;  Surgeon: REveritt Amber MD;  Location: [facility];  Service: Gynecology;  Laterality: N/A;     SOCIAL HISTORY:  Social History   Socioeconomic History  . Marital status: Widowed    Spouse name: Not on file  . Number of children: Not on file  . Years of education: Not on file  . Highest education level: Not on file  Occupational History  . Not on file  Tobacco Use  . Smoking status: Never Smoker  . Smokeless tobacco: Never Used  Substance and Sexual Activity  . Alcohol use: Yes    Alcohol/week: 4.0 standard drinks    Types: 4 Glasses of wine per week  Comment: weekends  . Drug use: Never  . Sexual activity: Not Currently  Other Topics Concern  . Not on file  Social History Narrative  . Not on file   Social Determinants of Health   Financial Resource Strain:   . Difficulty of Paying Living Expenses: Not on file  Food Insecurity: No Food Insecurity  . Worried About Charity fundraiser in the Last Year: Never true  . Ran Out of Food in the Last Year: Never true  Transportation Needs: No Transportation Needs  .  Lack of Transportation (Medical): No  . Lack of Transportation (Non-Medical): No  Physical Activity:   . Days of Exercise per Week: Not on file  . Minutes of Exercise per Session: Not on file  Stress:   . Feeling of Stress : Not on file  Social Connections:   . Frequency of Communication with Friends and Family: Not on file  . Frequency of Social Gatherings with Friends and Family: Not on file  . Attends Religious Services: Not on file  . Active Member of Clubs or Organizations: Not on file  . Attends Archivist Meetings: Not on file  . Marital Status: Not on file  Intimate Partner Violence:   . Fear of Current or Ex-Partner: Not on file  . Emotionally Abused: Not on file  . Physically Abused: Not on file  . Sexually Abused: Not on file    FAMILY HISTORY:  Family History  Problem Relation Age of Onset  . Hypertension Father   . Heart disease Father   . Cancer Father        lung  . Hypertension Mother     CURRENT MEDICATIONS:  Outpatient Encounter Medications as of 12/29/2018  Medication Sig  . amLODipine (NORVASC) 5 MG tablet Take 1 tablet (5 mg total) by mouth daily.  Marland Kitchen atorvastatin (LIPITOR) 20 MG tablet Take 1 tablet (20 mg total) by mouth daily.  Marland Kitchen CARBOPLATIN IV Inject into the vein every 21 ( twenty-one) days.  Marland Kitchen dicyclomine (BENTYL) 20 MG tablet Take 1 tablet (20 mg total) by mouth every 6 (six) hours.  . [EXPIRED] Lactulose 20 GM/30ML SOLN Take 30 mLs (20 g total) by mouth once for 1 dose.  . lidocaine-prilocaine (EMLA) cream Apply small amount to port a cath site and cover with plastic wrap one hour prior to chemotherapy appointments  . oxyCODONE-acetaminophen (PERCOCET) 5-325 MG tablet Take 1 tablet by mouth every 8 (eight) hours as needed (pain).  Marland Kitchen PACLITAXEL IV Inject into the vein every 21 ( twenty-one) days.  . polyethylene glycol (MIRALAX / GLYCOLAX) 17 g packet Take 17 g by mouth 2 (two) times daily as needed.  . prochlorperazine (COMPAZINE) 10 MG  tablet Take 1 tablet (10 mg total) by mouth every 6 (six) hours as needed (Nausea or vomiting).   No facility-administered encounter medications on file as of 12/29/2018.    ALLERGIES:  No Known Allergies   PHYSICAL EXAM:  ECOG Performance status: 1  There were no vitals filed for this visit. There were no vitals filed for this visit.  Physical Exam Vitals reviewed.  Constitutional:      Appearance: Normal appearance.  Cardiovascular:     Rate and Rhythm: Normal rate and regular rhythm.  Pulmonary:     Effort: Pulmonary effort is normal.     Breath sounds: Normal breath sounds.  Abdominal:     General: There is distension.     Palpations: Abdomen is soft. There is mass.  Skin:    General: Skin is warm.  Neurological:     General: No focal deficit present.     Mental Status: She is alert and oriented to person, place, and time.  Psychiatric:        Mood and Affect: Mood normal.        Behavior: Behavior normal.      LABORATORY DATA:  I have reviewed the labs as listed.  CBC    Component Value Date/Time   WBC 10.0 12/29/2018 0759   RBC 3.79 (L) 12/29/2018 0759   HGB 10.5 (L) 12/29/2018 0759   HCT 33.0 (L) 12/29/2018 0759   PLT 417 (H) 12/29/2018 0759   MCV 87.1 12/29/2018 0759   MCH 27.7 12/29/2018 0759   MCHC 31.8 12/29/2018 0759   RDW 13.2 12/29/2018 0759   LYMPHSABS 1.2 12/29/2018 0759   MONOABS 0.9 12/29/2018 0759   EOSABS 0.0 12/29/2018 0759   BASOSABS 0.1 12/29/2018 0759   CMP Latest Ref Rng & Units 12/29/2018 12/22/2018 12/09/2018  Glucose 70 - 99 mg/dL 183(H) 103(H) 96  BUN 6 - 20 mg/dL 8 9 9   Creatinine 0.44 - 1.00 mg/dL 0.50 0.52 0.45  Sodium 135 - 145 mmol/L 137 139 140  Potassium 3.5 - 5.1 mmol/L 3.7 4.0 3.8  Chloride 98 - 111 mmol/L 104 105 104  CO2 22 - 32 mmol/L 22 23 24   Calcium 8.9 - 10.3 mg/dL 8.6(L) 9.2 9.2  Total Protein 6.5 - 8.1 g/dL 6.5 7.4 7.6  Total Bilirubin 0.3 - 1.2 mg/dL 0.5 0.4 0.8  Alkaline Phos 38 - 126 U/L 96 120 160(H)   AST 15 - 41 U/L 16 17 76(H)  ALT 0 - 44 U/L 15 17 75(H)       DIAGNOSTIC IMAGING:  I have independently reviewed the scans and discussed with the patient.   ASSESSMENT & PLAN:   Endometrial cancer ([place]) 1.  Recurrent endometrial carcinosarcoma: -Status post TAH, BSO, bilateral pelvic and para-aortic lymph node resections on 04/13/2018 -Pathology showed carcinosarcoma (malignant mixed mullerian tumor) arising in an endometrial type polyp with no myometrial invasion identified.  High-grade.  0/39 lymph nodes positive.  Pathological staging PT1APN0, FIGO stage Ia.  MMR was normal.  MSI-stable. -She had 2 to 3 days of abdominal pain and presented to the ER on 12/09/2018.  She lost 15 pounds but was trying to lose weight. -CT abdomen and pelvis on 12/09/2018 shows extensive peritoneal carcinomatosis with very aggressive appearing new soft tissue masses in the abdomen and pelvis.  There is a large conglomerate partially enhancing soft tissue mass in the pelvis measuring approximately 12 x 12 x 10 cm most concerning for recurrent endometrial carcinoma.  There is also left-sided lower abdominal mass along the omentum measuring 6.5 x 6 x 4.8 cm with associated small volume abdominal/pelvic ascites.  No disease was seen on prior CT scan from May 2020. -She was evaluated by Dr. Denman George and was recommended to start chemotherapy with carboplatin and paclitaxel. -Biopsy of the omental mass on 12/28/2018 shows poorly differentiated carcinoma consistent with her prior cancer. -CT of the chest was negative for metastatic disease. -Today we discussed to proceed with her first cycle of chemotherapy with carboplatin and paclitaxel.  We discussed the side effects and detail.  She will also receive Neulasta. -We will see her back in 1 week to evaluate for toxicities.  2.  Abdominal pain: -She has lower quadrant pain, predominantly on the right side.  Percocet 1 tablet/day is helping.  3.  Family history: -Father  died of lung cancer and was a smoker.  Maternal aunt had metastatic cancer.  Patient does not know the type.  4.  Nausea and vomiting: -She is using Zofran 8 mg every 6 hours as needed.      Orders placed this encounter:  No orders of the defined types were placed in this encounter.     [name], MD [place] [phone]

## 2018-12-29 NOTE — Progress Notes (Signed)
Proceed with treatment today per MD. Labs and pathology reviewed by MD today.

## 2018-12-29 NOTE — Patient Instructions (Signed)
[facility] at [facility] Discharge Instructions  Labs drawn from portacath today   Thank you for choosing [facility] at [facility] to provide your oncology and hematology care.  To afford each patient quality time with our provider, please arrive at least 15 minutes before your scheduled appointment time.   If you have a lab appointment with the Cancer Center please come in thru the Main Entrance and check in at the main information desk.  You need to re-schedule your appointment should you arrive 10 or more minutes late.  We strive to give you quality time with our providers, and arriving late affects you and other patients whose appointments are after yours.  Also, if you no show three or more times for appointments you may be dismissed from the clinic at the providers discretion.     Again, thank you for choosing [facility].  Our hope is that these requests will decrease the amount of time that you wait before being seen by our physicians.       _____________________________________________________________  Should you have questions after your visit to [facility], please contact our office at [phone] between the hours of 8:00 a.m. and 4:30 p.m.  Voicemails left after 4:00 p.m. will not be returned until the following business day.  For prescription refill requests, have your pharmacy contact our office and allow 72 hours.    Due to Covid, you will need to wear a mask upon entering the hospital. If you do not have a mask, a mask will be given to you at the Main Entrance upon arrival. For doctor visits, patients may have 1 support person with them. For treatment visits, patients can not have anyone with them due to social distancing guidelines and our immunocompromised population.     

## 2018-12-29 NOTE — Patient Instructions (Signed)
[facility] at [facility] Discharge Instructions  You were seen today by Dr. Katragadda. He went over your recent lab results. He will see you back in  for labs and follow up.   Thank you for choosing [facility] at [facility] to provide your oncology and hematology care.  To afford each patient quality time with our provider, please arrive at least 15 minutes before your scheduled appointment time.   If you have a lab appointment with the Cancer Center please come in thru the  Main Entrance and check in at the main information desk  You need to re-schedule your appointment should you arrive 10 or more minutes late.  We strive to give you quality time with our providers, and arriving late affects you and other patients whose appointments are after yours.  Also, if you no show three or more times for appointments you may be dismissed from the clinic at the providers discretion.     Again, thank you for choosing [facility].  Our hope is that these requests will decrease the amount of time that you wait before being seen by our physicians.       _____________________________________________________________  Should you have questions after your visit to [facility], please contact our office at [phone] between the hours of 8:00 a.m. and 4:30 p.m.  Voicemails left after 4:00 p.m. will not be returned until the following business day.  For prescription refill requests, have your pharmacy contact our office and allow 72 hours.    Cancer Center Support Programs:   > Cancer Support Group  2nd Tuesday of the month 1pm-2pm, Journey Room    

## 2018-12-30 ENCOUNTER — Telehealth (HOSPITAL_COMMUNITY): Payer: Self-pay

## 2018-12-30 NOTE — Telephone Encounter (Signed)
24 hour follow up call-patient stated she is doing ok today. Stated she did have some diarrhea but it stopped. Denies any nausea or vomiting. She is due for an injection on Friday. Encouraged her to call for any concerns or questions.

## 2018-12-31 ENCOUNTER — Other Ambulatory Visit: Payer: Self-pay

## 2018-12-31 ENCOUNTER — Inpatient Hospital Stay (HOSPITAL_COMMUNITY): Payer: Self-pay

## 2018-12-31 VITALS — BP 112/70 | HR 82 | Temp 97.9°F | Resp 18

## 2018-12-31 DIAGNOSIS — Z95828 Presence of other vascular implants and grafts: Secondary | ICD-10-CM

## 2018-12-31 DIAGNOSIS — C541 Malignant neoplasm of endometrium: Secondary | ICD-10-CM

## 2018-12-31 LAB — SURGICAL PATHOLOGY

## 2018-12-31 MED ORDER — PEGFILGRASTIM-CBQV 6 MG/0.6ML ~~LOC~~ SOSY
6.0000 mg | PREFILLED_SYRINGE | Freq: Once | SUBCUTANEOUS | Status: AC
  Administered 2018-12-31: 6 mg via SUBCUTANEOUS
  Filled 2018-12-31: qty 0.6

## 2018-12-31 NOTE — Progress Notes (Signed)
Written information given for Udenyca for review and to carry home.  Reviewed side effects and management with understanding verbalized.  All questions asked and answered.  No s/s of distress noted.   Patient tolerated Udenyca injection with no complaints voiced.  Site clean and dry with no bruising or swelling noted at site.  Band aid applied.  Vss with discharge and left ambulatory with no s/s of distress noted.

## 2019-01-01 ENCOUNTER — Encounter (HOSPITAL_COMMUNITY): Payer: Self-pay | Admitting: Hematology

## 2019-01-01 NOTE — Assessment & Plan Note (Signed)
1.  Recurrent endometrial carcinosarcoma: -Status post TAH, BSO, bilateral pelvic and para-aortic lymph node resections on 04/13/2018 -Pathology showed carcinosarcoma (malignant mixed mullerian tumor) arising in an endometrial type polyp with no myometrial invasion identified.  High-grade.  0/39 lymph nodes positive.  Pathological staging PT1APN0, FIGO stage Ia.  MMR was normal.  MSI-stable. -She had 2 to 3 days of abdominal pain and presented to the ER on 12/09/2018.  She lost 15 pounds but was trying to lose weight. -CT abdomen and pelvis on 12/09/2018 shows extensive peritoneal carcinomatosis with very aggressive appearing new soft tissue masses in the abdomen and pelvis.  There is a large conglomerate partially enhancing soft tissue mass in the pelvis measuring approximately 12 x 12 x 10 cm most concerning for recurrent endometrial carcinoma.  There is also left-sided lower abdominal mass along the omentum measuring 6.5 x 6 x 4.8 cm with associated small volume abdominal/pelvic ascites.  No disease was seen on prior CT scan from May 2020. -She was evaluated by Dr. Denman George and was recommended to start chemotherapy with carboplatin and paclitaxel. -Biopsy of the omental mass on 12/28/2018 shows poorly differentiated carcinoma consistent with her prior cancer. -CT of the chest was negative for metastatic disease. -Today we discussed to proceed with her first cycle of chemotherapy with carboplatin and paclitaxel.  We discussed the side effects and detail.  She will also receive Neulasta. -We will see her back in 1 week to evaluate for toxicities.  2.  Abdominal pain: -She has lower quadrant pain, predominantly on the right side.  Percocet 1 tablet/day is helping.  3.  Family history: -Father died of lung cancer and was a smoker.  Maternal aunt had metastatic cancer.  Patient does not know the type.  4.  Nausea and vomiting: -She is using Zofran 8 mg every 6 hours as needed.

## 2019-01-05 ENCOUNTER — Encounter: Payer: Self-pay | Admitting: Physician Assistant

## 2019-01-05 ENCOUNTER — Ambulatory Visit: Payer: Self-pay | Admitting: Physician Assistant

## 2019-01-05 DIAGNOSIS — I10 Essential (primary) hypertension: Secondary | ICD-10-CM

## 2019-01-05 DIAGNOSIS — Z5320 Procedure and treatment not carried out because of patient's decision for unspecified reasons: Secondary | ICD-10-CM

## 2019-01-05 DIAGNOSIS — Z7689 Persons encountering health services in other specified circumstances: Secondary | ICD-10-CM

## 2019-01-05 DIAGNOSIS — C541 Malignant neoplasm of endometrium: Secondary | ICD-10-CM

## 2019-01-05 DIAGNOSIS — E785 Hyperlipidemia, unspecified: Secondary | ICD-10-CM

## 2019-01-05 MED ORDER — AMLODIPINE BESYLATE 5 MG PO TABS: 5.0000 mg | ORAL_TABLET | Freq: Every day | ORAL | 6 refills | Status: DC

## 2019-01-05 NOTE — Progress Notes (Signed)
There were no vitals taken for this visit.   Subjective:    Patient ID: Jessica [name], female    DOB: May 08, 1960, 58 y.o.   MRN: [id number]  HPI: Jessica [name] is a 58 y.o. female presenting on 01/05/2019 for No chief complaint on file.   HPI   This is a telemedicine appointment through [employer] due to coronavirus pandemic and inclement weather.  I connected with  Jessica [name] on 01/05/19 by a video enabled telemedicine application and verified that I am speaking with the correct person using two identifiers.   I discussed the limitations of evaluation and management by telemedicine. The patient expressed understanding and agreed to proceed.  Pt is at home.  Provider is working from home office.    Pt appointment today to establish care as PCP.  Pt is a 58yo F who was previously at [facility] in [place] for PCP.  She is currently Seeing dr Delton Coombes for endometrial cancer.  She has recently started chemotherapy and will be getting this until the end of March.    Pt reapplied recently for medicaid.   She was Previously working at [employer] in [place].  Pt says she is doing okay.  She has no issues excepting the endometrial cancer.    Relevant past medical, surgical, family and social history reviewed and updated as indicated. Interim medical history since our last visit reviewed. Allergies and medications reviewed and updated.    Current Outpatient Medications:  .  atorvastatin (LIPITOR) 20 MG tablet, Take 1 tablet (20 mg total) by mouth daily., Disp: 90 tablet, Rfl: 3 .  CARBOPLATIN IV, Inject into the vein every 21 ( twenty-one) days., Disp: , Rfl:  .  dicyclomine (BENTYL) 20 MG tablet, Take 1 tablet (20 mg total) by mouth every 6 (six) hours., Disp: 30 tablet, Rfl: 1 .  ibuprofen (ADVIL) 200 MG tablet, Take 200 mg by mouth every 6 (six) hours as needed., Disp: , Rfl:  .  oxyCODONE-acetaminophen (PERCOCET) 5-325 MG tablet, Take 1 tablet  by mouth every 8 (eight) hours as needed (pain)., Disp: 30 tablet, Rfl: 0 .  PACLITAXEL IV, Inject into the vein every 21 ( twenty-one) days., Disp: , Rfl:  .  polyethylene glycol (MIRALAX / GLYCOLAX) 17 g packet, Take 17 g by mouth 2 (two) times daily as needed., Disp: , Rfl:  .  prochlorperazine (COMPAZINE) 10 MG tablet, Take 1 tablet (10 mg total) by mouth every 6 (six) hours as needed (Nausea or vomiting)., Disp: 30 tablet, Rfl: 1 .  amLODipine (NORVASC) 5 MG tablet, Take 1 tablet (5 mg total) by mouth daily. (Patient not taking: Reported on 01/05/2019), Disp: 30 tablet, Rfl: 6 .  lidocaine-prilocaine (EMLA) cream, Apply small amount to port a cath site and cover with plastic wrap one hour prior to chemotherapy appointments (Patient not taking: Reported on 01/05/2019), Disp: 30 g, Rfl: 3    Review of Systems  Per HPI unless specifically indicated above     Objective:    There were no vitals taken for this visit.  Wt Readings from Last 3 Encounters:  12/29/18 211 lb 12.8 oz (96.1 kg)  12/28/18 211 lb (95.7 kg)  12/22/18 211 lb 4.8 oz (95.8 kg)    Physical Exam Constitutional:      General: She is not in acute distress.    Appearance: Normal appearance. She is not ill-appearing.  HENT:     Head: Normocephalic and atraumatic.  Pulmonary:  Effort: Pulmonary effort is normal. No respiratory distress.  Neurological:     Mental Status: She is alert and oriented to person, place, and time.  Psychiatric:        Attention and Perception: Attention normal.        Mood and Affect: Mood normal.        Speech: Speech normal.        Behavior: Behavior normal. Behavior is cooperative.         Assessment & Plan:    Encounter Diagnoses  Name Primary?  . Encounter to establish care Yes  . Essential hypertension   . Hyperlipidemia, unspecified hyperlipidemia type   . Screening mammography declined   . Endometrial cancer [facility])       -Discussed screening mammogram and pt  would like to hold off on that for now.   -Will Check lipids (she can get this done when she gets her labs drawn for dr Delton Coombes) -No changes to meds.  Refilled amlodipine.  -will have pt follow up in 3 months.   She is to contact office sooner if she needs anything.  She is counseled to notify office if she gets approved for [facility]

## 2019-01-06 ENCOUNTER — Inpatient Hospital Stay (HOSPITAL_BASED_OUTPATIENT_CLINIC_OR_DEPARTMENT_OTHER): Payer: Self-pay | Admitting: Hematology

## 2019-01-06 ENCOUNTER — Encounter: Payer: Self-pay | Admitting: Pharmacy Technician

## 2019-01-06 ENCOUNTER — Other Ambulatory Visit: Payer: Self-pay

## 2019-01-06 ENCOUNTER — Inpatient Hospital Stay (HOSPITAL_COMMUNITY): Payer: Self-pay

## 2019-01-06 VITALS — BP 141/70 | HR 68 | Temp 97.1°F | Resp 16 | Wt 205.1 lb

## 2019-01-06 DIAGNOSIS — C541 Malignant neoplasm of endometrium: Secondary | ICD-10-CM

## 2019-01-06 DIAGNOSIS — Z95828 Presence of other vascular implants and grafts: Secondary | ICD-10-CM

## 2019-01-06 LAB — CBC WITH DIFFERENTIAL/PLATELET
Band Neutrophils: 13 %
Basophils Absolute: 0 10*3/uL (ref 0.0–0.1)
Basophils Relative: 0 %
Eosinophils Absolute: 0.3 10*3/uL (ref 0.0–0.5)
Eosinophils Relative: 1 %
HCT: 36.5 % (ref 36.0–46.0)
Hemoglobin: 11.4 g/dL — ABNORMAL LOW (ref 12.0–15.0)
Lymphocytes Relative: 12 %
Lymphs Abs: 4.1 10*3/uL — ABNORMAL HIGH (ref 0.7–4.0)
MCH: 27.3 pg (ref 26.0–34.0)
MCHC: 31.2 g/dL (ref 30.0–36.0)
MCV: 87.3 fL (ref 80.0–100.0)
Metamyelocytes Relative: 2 %
Monocytes Absolute: 3 10*3/uL — ABNORMAL HIGH (ref 0.1–1.0)
Monocytes Relative: 9 %
Myelocytes: 3 %
Neutro Abs: 24.3 10*3/uL — ABNORMAL HIGH (ref 1.7–7.7)
Neutrophils Relative %: 59 %
Platelets: 346 10*3/uL (ref 150–400)
Promyelocytes Relative: 1 %
RBC: 4.18 MIL/uL (ref 3.87–5.11)
RDW: 13.4 % (ref 11.5–15.5)
WBC: 33.8 10*3/uL — ABNORMAL HIGH (ref 4.0–10.5)
nRBC: 0.1 % (ref 0.0–0.2)

## 2019-01-06 LAB — COMPREHENSIVE METABOLIC PANEL
ALT: 37 U/L (ref 0–44)
AST: 30 U/L (ref 15–41)
Albumin: 3.7 g/dL (ref 3.5–5.0)
Alkaline Phosphatase: 204 U/L — ABNORMAL HIGH (ref 38–126)
Anion gap: 14 (ref 5–15)
BUN: 13 mg/dL (ref 6–20)
CO2: 26 mmol/L (ref 22–32)
Calcium: 9.4 mg/dL (ref 8.9–10.3)
Chloride: 101 mmol/L (ref 98–111)
Creatinine, Ser: 0.68 mg/dL (ref 0.44–1.00)
GFR calc Af Amer: >60 mL/min (ref >60)
GFR calc non Af Amer: >60 mL/min (ref >60)
Glucose, Bld: 124 mg/dL — ABNORMAL HIGH (ref 70–99)
Potassium: 4.3 mmol/L (ref 3.5–5.1)
Sodium: 141 mmol/L (ref 135–145)
Total Bilirubin: 0.4 mg/dL (ref 0.3–1.2)
Total Protein: 7.4 g/dL (ref 6.5–8.1)

## 2019-01-06 NOTE — Patient Instructions (Addendum)
[facility] at [facility] Discharge Instructions  You were seen today by Dr. Katragadda. He went over your recent lab results. He will see you back in 2 weeks for labs, treatment and follow up.   Thank you for choosing [facility] at [facility] to provide your oncology and hematology care.  To afford each patient quality time with our provider, please arrive at least 15 minutes before your scheduled appointment time.   If you have a lab appointment with the Cancer Center please come in thru the  Main Entrance and check in at the main information desk  You need to re-schedule your appointment should you arrive 10 or more minutes late.  We strive to give you quality time with our providers, and arriving late affects you and other patients whose appointments are after yours.  Also, if you no show three or more times for appointments you may be dismissed from the clinic at the providers discretion.     Again, thank you for choosing [facility].  Our hope is that these requests will decrease the amount of time that you wait before being seen by our physicians.       _____________________________________________________________  Should you have questions after your visit to [facility], please contact our office at [phone] between the hours of 8:00 a.m. and 4:30 p.m.  Voicemails left after 4:00 p.m. will not be returned until the following business day.  For prescription refill requests, have your pharmacy contact our office and allow 72 hours.    Cancer Center Support Programs:   > Cancer Support Group  2nd Tuesday of the month 1pm-2pm, Journey Room    

## 2019-01-06 NOTE — Progress Notes (Signed)
Jessica [name], [place] [address]   CLINIC:  Medical Oncology/Hematology  PCP:  Soyla Dryer, PA-C [place] [address] [phone]   REASON FOR VISIT:  Follow-up for recurrent endometrial carcinosarcoma.  CURRENT THERAPY: Carboplatin and paclitaxel every 3 weeks.  BRIEF ONCOLOGIC HISTORY:  Oncology History  Endometrial cancer ([place])  03/31/2018 Initial Diagnosis   Endometrial cancer ([place])   12/29/2018 -  Chemotherapy   The patient had palonosetron (ALOXI) injection 0.25 mg, 0.25 mg, Intravenous,  Once, 2 of 6 cycles Administration: 0.25 mg (12/29/2018), 0.25 mg (01/19/2019) pegfilgrastim-cbqv (UDENYCA) injection 6 mg, 6 mg, Subcutaneous, Once, 2 of 6 cycles Administration: 6 mg (12/31/2018), 6 mg (01/20/2019) CARBOplatin (PARAPLATIN) 860 mg in sodium chloride 0.9 % 500 mL chemo infusion, 860 mg (100 % of original dose 862.8 mg), Intravenous,  Once, 2 of 6 cycles Dose modification:   (original dose 862.8 mg, Cycle 1),   (original dose 862.8 mg, Cycle 2) Administration: 860 mg (12/29/2018), 860 mg (01/19/2019) fosaprepitant (EMEND) 150 mg in sodium chloride 0.9 % 145 mL IVPB, 150 mg, Intravenous,  Once, 2 of 6 cycles Administration: 150 mg (01/19/2019) PACLitaxel (TAXOL) 360 mg in sodium chloride 0.9 % 500 mL chemo infusion (> 50m/m2), 175 mg/m2 = 360 mg, Intravenous,  Once, 2 of 6 cycles Administration: 360 mg (12/29/2018), 360 mg (01/19/2019)  for chemotherapy treatment.       CANCER STAGING: Cancer Staging No matching staging information was found for the patient.   INTERVAL HISTORY:  [name]89y.o. female seen for follow-up of endometrial carcinosarcoma and toxicity assessment after cycle 1 of chemotherapy.  She reported dramatic improvement in her abdominal pain.  Appetite is 50%.  Energy levels are 75%.  She is not requiring pain medication on regular basis.  However she had pains in her legs which lasted 3  days after her chemotherapy.  Pain felt like tooth ache.  Denies any tingling or numbness in extremities.  She had occasional nausea which is controlled well with Compazine.    REVIEW OF SYSTEMS:  Review of Systems  Gastrointestinal: Positive for nausea.  Musculoskeletal:       Leg pains lasted 3 days after chemo.  All other systems reviewed and are negative.    PAST MEDICAL/SURGICAL HISTORY:  Past Medical History:  Diagnosis Date  . Elevated blood pressure reading   . Endometrial cancer ([place]   . PMB (postmenopausal bleeding)   . Port-A-Cath in place 12/24/2018   Past Surgical History:  Procedure Laterality Date  . IR IMAGING GUIDED PORT INSERTION  12/28/2018  . IR RADIOLOGIST EVAL & MGMT  06/02/2018  . IR RADIOLOGIST EVAL & MGMT  06/16/2018  . NECK SURGERY  2000   spinal surgery , titanium rod in place   . ROBOTIC ASSISTED TOTAL HYSTERECTOMY WITH BILATERAL SALPINGO OOPHERECTOMY N/A 04/13/2018   Procedure: XI ROBOTIC ASSISTED TOTAL HYSTERECTOMY WITH BILATERAL SALPINGO OOPHORECTOMY;  Surgeon: REveritt Amber MD;  Location: [facility];  Service: Gynecology;  Laterality: N/A;  . ROBOTIC PELVIC AND PARA-AORTIC LYMPH NODE DISSECTION N/A 04/13/2018   Procedure: XI ROBOTIC PELVIC LYMPHADECTOMY AND PARA-AORTIC LYMPH NODE DISSECTION;  Surgeon: REveritt Amber MD;  Location: [facility];  Service: Gynecology;  Laterality: N/A;     SOCIAL HISTORY:  Social History   Socioeconomic History  . Marital status: Widowed    Spouse name: Not on file  . Number of children: Not on file  . Years of education: Not on file  .  Highest education level: Not on file  Occupational History  . Not on file  Tobacco Use  . Smoking status: Never Smoker  . Smokeless tobacco: Never Used  Substance and Sexual Activity  . Alcohol use: Yes    Alcohol/week: 4.0 standard drinks    Types: 4 Glasses of wine per week    Comment: weekends  . Drug use: Never  . Sexual activity: Not Currently  Other Topics Concern  . Not on  file  Social History Narrative  . Not on file   Social Determinants of Health   Financial Resource Strain:   . Difficulty of Paying Living Expenses: Not on file  Food Insecurity: No Food Insecurity  . Worried About Charity fundraiser in the Last Year: Never true  . Ran Out of Food in the Last Year: Never true  Transportation Needs: No Transportation Needs  . Lack of Transportation (Medical): No  . Lack of Transportation (Non-Medical): No  Physical Activity:   . Days of Exercise per Week: Not on file  . Minutes of Exercise per Session: Not on file  Stress:   . Feeling of Stress : Not on file  Social Connections:   . Frequency of Communication with Friends and Family: Not on file  . Frequency of Social Gatherings with Friends and Family: Not on file  . Attends Religious Services: Not on file  . Active Member of Clubs or Organizations: Not on file  . Attends Archivist Meetings: Not on file  . Marital Status: Not on file  Intimate Partner Violence:   . Fear of Current or Ex-Partner: Not on file  . Emotionally Abused: Not on file  . Physically Abused: Not on file  . Sexually Abused: Not on file    FAMILY HISTORY:  Family History  Problem Relation Age of Onset  . Hypertension Father   . Heart disease Father   . Cancer Father        lung  . Hypertension Mother     CURRENT MEDICATIONS:  Outpatient Encounter Medications as of 01/06/2019  Medication Sig  . amLODipine (NORVASC) 5 MG tablet Take 1 tablet (5 mg total) by mouth daily.  Marland Kitchen atorvastatin (LIPITOR) 20 MG tablet Take 1 tablet (20 mg total) by mouth daily.  Marland Kitchen CARBOPLATIN IV Inject into the vein every 21 ( twenty-one) days.  Marland Kitchen PACLITAXEL IV Inject into the vein every 21 ( twenty-one) days.  . prochlorperazine (COMPAZINE) 10 MG tablet Take 1 tablet (10 mg total) by mouth every 6 (six) hours as needed (Nausea or vomiting). (Patient not taking: Reported on 01/19/2019)  . dicyclomine (BENTYL) 20 MG tablet Take 1  tablet (20 mg total) by mouth every 6 (six) hours. (Patient not taking: Reported on 01/06/2019)  . ibuprofen (ADVIL) 200 MG tablet Take 200 mg by mouth every 6 (six) hours as needed.  . lidocaine-prilocaine (EMLA) cream Apply small amount to port a cath site and cover with plastic wrap one hour prior to chemotherapy appointments (Patient not taking: Reported on 01/05/2019)  . oxyCODONE-acetaminophen (PERCOCET) 5-325 MG tablet Take 1 tablet by mouth every 8 (eight) hours as needed (pain). (Patient not taking: Reported on 01/06/2019)  . polyethylene glycol (MIRALAX / GLYCOLAX) 17 g packet Take 17 g by mouth 2 (two) times daily as needed.   No facility-administered encounter medications on file as of 01/06/2019.    ALLERGIES:  No Known Allergies   PHYSICAL EXAM:  ECOG Performance status: 1  Vitals:   01/06/19  1034  BP: (!) 141/70  Pulse: 68  Resp: 16  Temp: (!) 97.1 F (36.2 C)  SpO2: 98%   Filed Weights   01/06/19 1034  Weight: 205 lb 1.6 oz (93 kg)    Physical Exam Vitals reviewed.  Constitutional:      Appearance: Normal appearance.  Cardiovascular:     Rate and Rhythm: Normal rate and regular rhythm.  Pulmonary:     Effort: Pulmonary effort is normal.     Breath sounds: Normal breath sounds.  Abdominal:     General: There is distension.     Palpations: Abdomen is soft. There is mass.  Skin:    General: Skin is warm.  Neurological:     General: No focal deficit present.     Mental Status: She is alert and oriented to person, place, and time.  Psychiatric:        Mood and Affect: Mood normal.        Behavior: Behavior normal.      LABORATORY DATA:  I have reviewed the labs as listed.  CBC    Component Value Date/Time   WBC 7.1 01/19/2019 0841   RBC 4.16 01/19/2019 0841   HGB 11.4 (L) 01/19/2019 0841   HCT 36.1 01/19/2019 0841   PLT 394 01/19/2019 0841   MCV 86.8 01/19/2019 0841   MCH 27.4 01/19/2019 0841   MCHC 31.6 01/19/2019 0841   RDW 14.8  01/19/2019 0841   LYMPHSABS 2.2 01/19/2019 0841   MONOABS 0.7 01/19/2019 0841   EOSABS 0.1 01/19/2019 0841   BASOSABS 0.1 01/19/2019 0841   CMP Latest Ref Rng & Units 01/19/2019 01/06/2019 12/29/2018  Glucose 70 - 99 mg/dL 105(H) 124(H) 183(H)  BUN 6 - 20 mg/dL 18 13 8   Creatinine 0.44 - 1.00 mg/dL 0.59 0.68 0.50  Sodium 135 - 145 mmol/L 140 141 137  Potassium 3.5 - 5.1 mmol/L 4.1 4.3 3.7  Chloride 98 - 111 mmol/L 106 101 104  CO2 22 - 32 mmol/L 24 26 22   Calcium 8.9 - 10.3 mg/dL 9.0 9.4 8.6(L)  Total Protein 6.5 - 8.1 g/dL 7.1 7.4 6.5  Total Bilirubin 0.3 - 1.2 mg/dL 0.3 0.4 0.5  Alkaline Phos 38 - 126 U/L 124 204(H) 96  AST 15 - 41 U/L 29 30 16   ALT 0 - 44 U/L 44 37 15       DIAGNOSTIC IMAGING:  I have independently reviewed the scans and discussed with the patient.   ASSESSMENT & PLAN:   Endometrial cancer ([place]) 1.  Recurrent endometrial carcinosarcoma: -Status post TAH, BSO, bilateral pelvic and para-aortic lymph node resections on 04/13/2018 -Pathology showed carcinosarcoma (malignant mixed mullerian tumor) arising in an endometrial type polyp with no myometrial invasion identified.  High-grade.  0/39 lymph nodes positive.  Pathological staging PT1APN0, FIGO stage Ia.  MMR was normal.  MSI-stable. -She had 2 to 3 days of abdominal pain and presented to the ER on 12/09/2018.  She lost 15 pounds but was trying to lose weight. -CT abdomen and pelvis on 12/09/2018 shows extensive peritoneal carcinomatosis with very aggressive appearing new soft tissue masses in the abdomen and pelvis.  There is a large conglomerate partially enhancing soft tissue mass in the pelvis measuring approximately 12 x 12 x 10 cm most concerning for recurrent endometrial carcinoma.  There is also left-sided lower abdominal mass along the omentum measuring 6.5 x 6 x 4.8 cm with associated small volume abdominal/pelvic ascites.  No disease was seen on prior CT scan  from May 2020. -She was evaluated by Dr.  Denman George and was recommended to start chemotherapy with carboplatin and paclitaxel. -Biopsy of the omental mass on 12/28/2018 shows poorly differentiated carcinoma consistent with her prior cancer. -CT of the chest was negative for metastatic disease. -Cycle 1 of chemotherapy with carboplatin and paclitaxel on 12/29/2018. -She reported pain in her feet, similar to a bad tooth ache for 3 days.  Denies any tingling or numbness in the extremities.  She took pain medication which helped. -Otherwise she felt tremendous improvement in her abdominal pain.  We reviewed her labs today.  White count is elevated secondary to growth factor effect. -She will come back in 2 weeks for follow-up.  We will consider discontinuing her Foley catheter at that time.  2.  Abdominal pain: -Pain in the lower quadrants have improved tremendously after cycle 1.  She is not requiring Percocet on regular basis.  3.  Family history: -Father died of lung cancer and was a smoker.  Maternal aunt had metastatic cancer.  Patient does not know the type.  4.  Nausea and vomiting: -She is using Zofran 8 mg every 6 hours as needed.      Orders placed this encounter:  Orders Placed This Encounter  Procedures  . CBC with Differential/Platelet  . Comprehensive metabolic panel  . Magnesium      [name], MD [facility] [phone]

## 2019-01-06 NOTE — Progress Notes (Signed)
Patient has been approved for drug assistance by [employer] for [employer]. The enrollment period is from 12/31/18-01/04/20 based on self pay. First DOS covered is 12/31/18.

## 2019-01-11 ENCOUNTER — Telehealth (HOSPITAL_COMMUNITY): Payer: Self-pay | Admitting: General Practice

## 2019-01-11 NOTE — Telephone Encounter (Signed)
[facility] CSW Progress Notes  Called patient to follow up on referrals made.  Has heard from [facility] and has returned paperwork needed to get help applying for disability.  Has also heard from [place], Duanne [facility] and [facility] (counseling for granddaughter and daughter if needed).  Appreciative of connections made, no needs at this time.  Encouraged to recontact CSW if further needs arise.  Edwyna Shell, LCSW Clinical Social Worker Phone:  [phone] Cell:  [phone]

## 2019-01-19 ENCOUNTER — Encounter (HOSPITAL_COMMUNITY): Payer: Self-pay | Admitting: Nurse Practitioner

## 2019-01-19 ENCOUNTER — Inpatient Hospital Stay (HOSPITAL_COMMUNITY): Payer: Self-pay

## 2019-01-19 ENCOUNTER — Other Ambulatory Visit: Payer: Self-pay

## 2019-01-19 ENCOUNTER — Inpatient Hospital Stay (HOSPITAL_BASED_OUTPATIENT_CLINIC_OR_DEPARTMENT_OTHER): Payer: Self-pay | Admitting: Nurse Practitioner

## 2019-01-19 VITALS — BP 132/69 | HR 63 | Temp 97.8°F | Resp 18

## 2019-01-19 DIAGNOSIS — Z95828 Presence of other vascular implants and grafts: Secondary | ICD-10-CM

## 2019-01-19 DIAGNOSIS — C541 Malignant neoplasm of endometrium: Secondary | ICD-10-CM

## 2019-01-19 LAB — CBC WITH DIFFERENTIAL/PLATELET
Abs Immature Granulocytes: 0.03 10*3/uL (ref 0.00–0.07)
Basophils Absolute: 0.1 10*3/uL (ref 0.0–0.1)
Basophils Relative: 1 %
Eosinophils Absolute: 0.1 10*3/uL (ref 0.0–0.5)
Eosinophils Relative: 1 %
HCT: 36.1 % (ref 36.0–46.0)
Hemoglobin: 11.4 g/dL — ABNORMAL LOW (ref 12.0–15.0)
Immature Granulocytes: 0 %
Lymphocytes Relative: 31 %
Lymphs Abs: 2.2 10*3/uL (ref 0.7–4.0)
MCH: 27.4 pg (ref 26.0–34.0)
MCHC: 31.6 g/dL (ref 30.0–36.0)
MCV: 86.8 fL (ref 80.0–100.0)
Monocytes Absolute: 0.7 10*3/uL (ref 0.1–1.0)
Monocytes Relative: 9 %
Neutro Abs: 4 10*3/uL (ref 1.7–7.7)
Neutrophils Relative %: 58 %
Platelets: 394 10*3/uL (ref 150–400)
RBC: 4.16 MIL/uL (ref 3.87–5.11)
RDW: 14.8 % (ref 11.5–15.5)
WBC: 7.1 10*3/uL (ref 4.0–10.5)
nRBC: 0 % (ref 0.0–0.2)

## 2019-01-19 LAB — COMPREHENSIVE METABOLIC PANEL
ALT: 44 U/L (ref 0–44)
AST: 29 U/L (ref 15–41)
Albumin: 3.7 g/dL (ref 3.5–5.0)
Alkaline Phosphatase: 124 U/L (ref 38–126)
Anion gap: 10 (ref 5–15)
BUN: 18 mg/dL (ref 6–20)
CO2: 24 mmol/L (ref 22–32)
Calcium: 9 mg/dL (ref 8.9–10.3)
Chloride: 106 mmol/L (ref 98–111)
Creatinine, Ser: 0.59 mg/dL (ref 0.44–1.00)
GFR calc Af Amer: >60 mL/min (ref >60)
GFR calc non Af Amer: >60 mL/min (ref >60)
Glucose, Bld: 105 mg/dL — ABNORMAL HIGH (ref 70–99)
Potassium: 4.1 mmol/L (ref 3.5–5.1)
Sodium: 140 mmol/L (ref 135–145)
Total Bilirubin: 0.3 mg/dL (ref 0.3–1.2)
Total Protein: 7.1 g/dL (ref 6.5–8.1)

## 2019-01-19 LAB — MAGNESIUM: Magnesium: 2 mg/dL (ref 1.7–2.4)

## 2019-01-19 MED ORDER — PALONOSETRON HCL INJECTION 0.25 MG/5ML
INTRAVENOUS | Status: AC
  Filled 2019-01-19: qty 5

## 2019-01-19 MED ORDER — PALONOSETRON HCL INJECTION 0.25 MG/5ML
0.2500 mg | Freq: Once | INTRAVENOUS | Status: AC
  Administered 2019-01-19: 0.25 mg via INTRAVENOUS

## 2019-01-19 MED ORDER — SODIUM CHLORIDE 0.9% FLUSH
10.0000 mL | INTRAVENOUS | Status: DC | PRN
  Administered 2019-01-19: 10 mL

## 2019-01-19 MED ORDER — FAMOTIDINE IN NACL 20-0.9 MG/50ML-% IV SOLN
20.0000 mg | Freq: Once | INTRAVENOUS | Status: AC
  Administered 2019-01-19: 20 mg via INTRAVENOUS
  Filled 2019-01-19: qty 50

## 2019-01-19 MED ORDER — SODIUM CHLORIDE 0.9 % IV SOLN
150.0000 mg | Freq: Once | INTRAVENOUS | Status: AC
  Administered 2019-01-19: 150 mg via INTRAVENOUS
  Filled 2019-01-19: qty 150

## 2019-01-19 MED ORDER — SODIUM CHLORIDE 0.9 % IV SOLN: Freq: Once | INTRAVENOUS | Status: AC

## 2019-01-19 MED ORDER — SODIUM CHLORIDE 0.9 % IV SOLN
175.0000 mg/m2 | Freq: Once | INTRAVENOUS | Status: AC
  Administered 2019-01-19: 360 mg via INTRAVENOUS
  Filled 2019-01-19: qty 60

## 2019-01-19 MED ORDER — DIPHENHYDRAMINE HCL 50 MG/ML IJ SOLN
INTRAMUSCULAR | Status: AC
  Filled 2019-01-19: qty 1

## 2019-01-19 MED ORDER — HEPARIN SOD (PORK) LOCK FLUSH 100 UNIT/ML IV SOLN
500.0000 [IU] | Freq: Once | INTRAVENOUS | Status: AC | PRN
  Administered 2019-01-19: 500 [IU]

## 2019-01-19 MED ORDER — DIPHENHYDRAMINE HCL 50 MG/ML IJ SOLN
50.0000 mg | Freq: Once | INTRAMUSCULAR | Status: AC
  Administered 2019-01-19: 50 mg via INTRAVENOUS

## 2019-01-19 MED ORDER — SODIUM CHLORIDE 0.9 % IV SOLN
10.0000 mg | Freq: Once | INTRAVENOUS | Status: AC
  Administered 2019-01-19: 10 mg via INTRAVENOUS
  Filled 2019-01-19: qty 10

## 2019-01-19 MED ORDER — SODIUM CHLORIDE 0.9 % IV SOLN
862.8000 mg | Freq: Once | INTRAVENOUS | Status: AC
  Administered 2019-01-19: 860 mg via INTRAVENOUS
  Filled 2019-01-19: qty 86

## 2019-01-19 NOTE — Patient Instructions (Signed)
[facility] at [facility] Discharge Instructions  Follow-up in 3 weeks with repeat labs and treatment   Thank you for choosing [facility] at [facility] to provide your oncology and hematology care.  To afford each patient quality time with our provider, please arrive at least 15 minutes before your scheduled appointment time.   If you have a lab appointment with the [facility] please come in thru the Main Entrance and check in at the main information desk.  You need to re-schedule your appointment should you arrive 10 or more minutes late.  We strive to give you quality time with our providers, and arriving late affects you and other patients whose appointments are after yours.  Also, if you no show three or more times for appointments you may be dismissed from the clinic at the providers discretion.     Again, thank you for choosing [facility].  Our hope is that these requests will decrease the amount of time that you wait before being seen by our physicians.       _____________________________________________________________  Should you have questions after your visit to [facility], please contact our office at [phone] between the hours of 8:00 a.m. and 4:30 p.m.  Voicemails left after 4:00 p.m. will not be returned until the following business day.  For prescription refill requests, have your pharmacy contact our office and allow 72 hours.    Due to Covid, you will need to wear a mask upon entering the hospital. If you do not have a mask, a mask will be given to you at the Main Entrance upon arrival. For doctor visits, patients may have 1 support person with them. For treatment visits, patients can not have anyone with them due to social distancing guidelines and our immunocompromised population.

## 2019-01-19 NOTE — Assessment & Plan Note (Addendum)
1.  Recurrent endometrial carcinosarcoma: -Status post TAH, BSO, bilateral pelvic and para-aortic lymph node resection on 04/13/2018. -Pathology showed carcinosarcoma (malignant mixed mullerian tumor) arising in an endometrial type polyp with no myometrial invasion identified.  High-grade.  0/39 lymph nodes positive.  Pathological staging PT1APN0, FIGO stage Ia.  MMR was normal.  MSI stable. -She had 2 to 3 days of abdominal pain and presented to the ER on 12/09/2018.  She had lost 15 pounds but was trying to lose weight at that time. -CT abdomen pelvis on 12/09/2018 showed extensive peritoneal carcinomatosis with very aggressive appearing new soft tissue masses in the abdomen and pelvis.  There is a large conglomerate partially enhancing soft tissue mass in the pelvis measuring approximately 12 x 12 x 10 cm most concerning for recurrent endometrial carcinoma.  There is also abdominal/pelvic ascites.  No disease was seen on prior CT scans from May 2020. -She was evaluated by Dr. Denman George and was recommended to start chemotherapy with carboplatin and paclitaxel. -Biopsy of the omental mass on 12/28/2018 showed poorly differentiated carcinoma consistent with her prior cancer. -CT of the chest was negative for metastatic disease. -Today she is here for her second cycle of treatment on 01/19/2019.  Labs were reviewed and all WNL -We will see her back in 3 weeks with labs and treatment.  2.  Abdominal pain: -She has a lower quadrant pain, predominantly on the right side. -She is taking Percocet 1 tablet a day usually helps her pain.  3.  Difficulty urinating: -On her last visit 2 weeks ago she had a Foley catheter placed to help with urination. -We will remove her Foley today.  If she is unable to void in 12 hours she will need to go to the ER to have it replaced.  If her pain increases to unmanageable then she will need to come back tomorrow to have the Foley replaced.  4.  Nausea and vomiting: -She is  using Zofran 8 mg every 6 hours as needed.

## 2019-01-19 NOTE — Progress Notes (Signed)
Labs reviewed with Francene Finders NP. Proceed with treatment today per MD.  Urinary catheter removed per Francene Finders NP.  Per Francene Finders , if she doesnt void in 12 hours she will need to go to the ER to have it replaced. If her pain increases without it then she need to come back tomorrow to have it replaced.   Patient informed of the above.   Treatment given per orders. Patient tolerated it well without problems. Vitals stable and discharged home from clinic ambulatory. Follow up as scheduled.

## 2019-01-19 NOTE — Progress Notes (Signed)
[place], [place] [address]   CLINIC:  Medical Oncology/Hematology  PCP:  Soyla Dryer, PA-C [place] [address] [phone]   REASON FOR VISIT: Follow-up for endometrial cancer  CURRENT THERAPY: Carboplatin and paclitaxel every 3 weeks  BRIEF ONCOLOGIC HISTORY:  Oncology History  Endometrial cancer ([place])  03/31/2018 Initial Diagnosis   Endometrial cancer ([place])   12/29/2018 -  Chemotherapy   The patient had palonosetron (ALOXI) injection 0.25 mg, 0.25 mg, Intravenous,  Once, 1 of 6 cycles Administration: 0.25 mg (12/29/2018) pegfilgrastim-cbqv (UDENYCA) injection 6 mg, 6 mg, Subcutaneous, Once, 1 of 6 cycles Administration: 6 mg (12/31/2018) CARBOplatin (PARAPLATIN) 860 mg in sodium chloride 0.9 % 500 mL chemo infusion, 860 mg (100 % of original dose 862.8 mg), Intravenous,  Once, 1 of 6 cycles Dose modification:   (original dose 862.8 mg, Cycle 1),   (original dose 862.8 mg, Cycle 2) Administration: 860 mg (12/29/2018) fosaprepitant (EMEND) 150 mg in sodium chloride 0.9 % 145 mL IVPB, 150 mg, Intravenous,  Once, 1 of 6 cycles PACLitaxel (TAXOL) 360 mg in sodium chloride 0.9 % 500 mL chemo infusion (> 74m/m2), 175 mg/m2 = 360 mg, Intravenous,  Once, 1 of 6 cycles Administration: 360 mg (12/29/2018)  for chemotherapy treatment.      INTERVAL HISTORY:  Ms. TOelkers89y.o. female returns for routine follow-up for endometrial cancer.  Patient reports she is still having lower abdominal pain however her pain medication is helping with that.  She also reports that she has been nauseous for a few days after treatment.  Her nausea medication is also helping with this.  She had diarrhea only twice.  Otherwise she is doing well.  She denies any new pains. Had not noticed any recent bleeding such as epistaxis, hematuria or hematochezia. Denies recent chest pain on exertion, shortness of breath on minimal exertion, pre-syncopal  episodes, or palpitations. Denies any numbness or tingling in hands or feet. Denies any recent fevers, infections, or recent hospitalizations. Patient reports appetite at 75% and energy level at 50%.  She is eating well and maintaining her weight at this time.     REVIEW OF SYSTEMS:  Review of Systems  Gastrointestinal: Positive for abdominal pain and nausea.  Psychiatric/Behavioral: Positive for sleep disturbance.  All other systems reviewed and are negative.    PAST MEDICAL/SURGICAL HISTORY:  Past Medical History:  Diagnosis Date  . Elevated blood pressure reading   . [place]ometrial [place]   . PMB (postmenopausal bleeding)   . Port-A-Cath in place 12/24/2018   Past Surgical History:  Procedure Laterality Date  . IR IMAGING GUIDED PORT INSERTION  12/28/2018  . IR RADIOLOGIST EVAL & MGMT  06/02/2018  . IR RADIOLOGIST EVAL & MGMT  06/16/2018  . NECK SURGERY  2000   spinal surgery , titanium rod in place   . ROBOTIC ASSISTED TOTAL HYSTERECTOMY WITH BILATERAL SALPINGO OOPHERECTOMY N/A 04/13/2018   Procedure: XI ROBOTIC ASSISTED TOTAL HYSTERECTOMY WITH BILATERAL SALPINGO OOPHORECTOMY;  Surgeon: REveritt Amber MD;  Location: [facility];  Service: Gynecology;  Laterality: N/A;  . ROBOTIC PELVIC AND PARA-AORTIC LYMPH NODE DISSECTION N/A 04/13/2018   Procedure: XI ROBOTIC PELVIC LYMPHADECTOMY AND PARA-AORTIC LYMPH NODE DISSECTION;  Surgeon: REveritt Amber MD;  Location: [facility];  Service: Gynecology;  Laterality: N/A;     SOCIAL HISTORY:  Social History   Socioeconomic History  . Marital status: Widowed    Spouse name: Not on file  . Number  of children: Not on file  . Years of education: Not on file  . Highest education level: Not on file  Occupational History  . Not on file  Tobacco Use  . Smoking status: Never Smoker  . Smokeless tobacco: Never Used  Substance and Sexual Activity  . Alcohol use: Yes    Alcohol/week: 4.0 standard drinks    Types: 4 Glasses of wine per week     Comment: weekends  . Drug use: Never  . Sexual activity: Not Currently  Other Topics Concern  . Not on file  Social History Narrative  . Not on file   Social Determinants of Health   Financial Resource Strain:   . Difficulty of Paying Living Expenses: Not on file  Food Insecurity: No Food Insecurity  . Worried About Charity fundraiser in the Last Year: Never true  . Ran Out of Food in the Last Year: Never true  Transportation Needs: No Transportation Needs  . Lack of Transportation (Medical): No  . Lack of Transportation (Non-Medical): No  Physical Activity:   . Days of Exercise per Week: Not on file  . Minutes of Exercise per Session: Not on file  Stress:   . Feeling of Stress : Not on file  Social Connections:   . Frequency of Communication with Friends and Family: Not on file  . Frequency of Social Gatherings with Friends and Family: Not on file  . Attends Religious Services: Not on file  . Active Member of Clubs or Organizations: Not on file  . Attends Archivist Meetings: Not on file  . Marital Status: Not on file  Intimate Partner Violence:   . Fear of Current or Ex-Partner: Not on file  . Emotionally Abused: Not on file  . Physically Abused: Not on file  . Sexually Abused: Not on file    FAMILY HISTORY:  Family History  Problem Relation Age of Onset  . Hypertension Father   . Heart disease Father   . Cancer Father        lung  . Hypertension Mother     CURRENT MEDICATIONS:  Outpatient Encounter Medications as of 01/19/2019  Medication Sig  . amLODipine (NORVASC) 5 MG tablet Take 1 tablet (5 mg total) by mouth daily.  Marland Kitchen atorvastatin (LIPITOR) 20 MG tablet Take 1 tablet (20 mg total) by mouth daily.  Marland Kitchen CARBOPLATIN IV Inject into the vein every 21 ( twenty-one) days.  Marland Kitchen PACLITAXEL IV Inject into the vein every 21 ( twenty-one) days.  Marland Kitchen dicyclomine (BENTYL) 20 MG tablet Take 1 tablet (20 mg total) by mouth every 6 (six) hours. (Patient not taking:  Reported on 01/06/2019)  . ibuprofen (ADVIL) 200 MG tablet Take 200 mg by mouth every 6 (six) hours as needed.  . lidocaine-prilocaine (EMLA) cream Apply small amount to port a cath site and cover with plastic wrap one hour prior to chemotherapy appointments (Patient not taking: Reported on 01/05/2019)  . oxyCODONE-acetaminophen (PERCOCET) 5-325 MG tablet Take 1 tablet by mouth every 8 (eight) hours as needed (pain). (Patient not taking: Reported on 01/06/2019)  . polyethylene glycol (MIRALAX / GLYCOLAX) 17 g packet Take 17 g by mouth 2 (two) times daily as needed.  . prochlorperazine (COMPAZINE) 10 MG tablet Take 1 tablet (10 mg total) by mouth every 6 (six) hours as needed (Nausea or vomiting). (Patient not taking: Reported on 01/19/2019)   No facility-administered encounter medications on file as of 01/19/2019.    ALLERGIES:  No Known  Allergies   PHYSICAL EXAM:  ECOG Performance status: 1  Vitals:   01/19/19 0840  BP: 134/63  Pulse: 60  Resp: 18  Temp: 97.6 F (36.4 C)  SpO2: 97%   Filed Weights   01/19/19 0840  Weight: 209 lb 12.8 oz (95.2 kg)    Physical Exam Constitutional:      Appearance: Normal appearance. She is normal weight.  Cardiovascular:     Rate and Rhythm: Normal rate and regular rhythm.     Heart sounds: Normal heart sounds.  Pulmonary:     Effort: Pulmonary effort is normal.     Breath sounds: Normal breath sounds.  Abdominal:     General: Bowel sounds are normal.     Palpations: Abdomen is soft.  Musculoskeletal:        General: Normal range of motion.  Skin:    General: Skin is warm.  Neurological:     Mental Status: She is alert and oriented to person, place, and time. Mental status is at baseline.  Psychiatric:        Mood and Affect: Mood normal.        Behavior: Behavior normal.        Thought Content: Thought content normal.        Judgment: Judgment normal.      LABORATORY DATA:  I have reviewed the labs as listed.  CBC      Component Value Date/Time   WBC 7.1 01/19/2019 0841   RBC 4.16 01/19/2019 0841   HGB 11.4 (L) 01/19/2019 0841   HCT 36.1 01/19/2019 0841   PLT 394 01/19/2019 0841   MCV 86.8 01/19/2019 0841   MCH 27.4 01/19/2019 0841   MCHC 31.6 01/19/2019 0841   RDW 14.8 01/19/2019 0841   LYMPHSABS 2.2 01/19/2019 0841   MONOABS 0.7 01/19/2019 0841   EOSABS 0.1 01/19/2019 0841   BASOSABS 0.1 01/19/2019 0841   CMP Latest Ref Rng & Units 01/19/2019 01/06/2019 12/29/2018  Glucose 70 - 99 mg/dL 105(H) 124(H) 183(H)  BUN 6 - 20 mg/dL _0 Creatinine 0.44 - 1.00 mg/dL 0.59 0.68 0.50  Sodium 135 - 145 mmol/L 140 141 137  Potassium 3.5 - 5.1 mmol/L 4.1 4.3 3.7  Chloride 98 - 111 mmol/L 106 101 104  CO2 22 - 32 mmol/L _1 Calcium 8.9 - 10.3 mg/dL 9.0 9.4 8.6(L)  Total Protein 6.5 - 8.1 g/dL 7.1 7.4 6.5  Total Bilirubin 0.3 - 1.2 mg/dL 0.3 0.4 0.5  Alkaline Phos 38 - 126 U/L 124 204(H) 96  AST 15 - 41 U/L _2 ALT 0 - 44 U/L 44 37 15    I personally performed a face-to-face visit.  All questions were answered to patient's stated satisfaction. Encouraged patient to call with any new concerns or questions before his next visit to the cancer center and we can certain see him sooner, if needed.     ASSESSMENT & PLAN:   Endometrial cancer ([place]) 1.  Recurrent endometrial carcinosarcoma: -Status post TAH, BSO, bilateral pelvic and para-aortic lymph node resection on 04/13/2018. -Pathology showed carcinosarcoma (malignant mixed mullerian tumor) arising in an endometrial type polyp with no myometrial invasion identified.  High-grade.  0/39 lymph nodes positive.  Pathological staging PT1APN0, FIGO stage Ia.  MMR was normal.  MSI stable. -She had 2 to 3 days of abdominal pain and presented to the ER on 12/09/2018.  She had lost 15 pounds but was trying to lose weight at  that time. -CT abdomen pelvis on 12/09/2018 showed extensive peritoneal carcinomatosis with very aggressive appearing new soft  tissue masses in the abdomen and pelvis.  There is a large conglomerate partially enhancing soft tissue mass in the pelvis measuring approximately 12 x 12 x 10 cm most concerning for recurrent endometrial carcinoma.  There is also abdominal/pelvic ascites.  No disease was seen on prior CT scans from May 2020. -She was evaluated by Dr. Denman George and was recommended to start chemotherapy with carboplatin and paclitaxel. -Biopsy of the omental mass on 12/28/2018 showed poorly differentiated carcinoma consistent with her prior cancer. -CT of the chest was negative for metastatic disease. -Today she is here for her second cycle of treatment on 01/19/2019.  Labs were reviewed and all WNL -We will see her back in 3 weeks with labs and treatment.  2.  Abdominal pain: -She has a lower quadrant pain, predominantly on the right side. -She is taking Percocet 1 tablet a day usually helps her pain.  3.  Difficulty urinating: -On her last visit 2 weeks ago she had a Foley catheter placed to help with urination. -We will remove her Foley today.  If she is unable to void in 12 hours she will need to go to the ER to have it replaced.  If her pain increases to unmanageable then she will need to come back tomorrow to have the Foley replaced.  4.  Nausea and vomiting: -She is using Zofran 8 mg every 6 hours as needed.      Orders placed this encounter:  Orders Placed This Encounter  Procedures  . Lactate dehydrogenase  . Magnesium  . CBC with Differential/Platelet  . Comprehensive metabolic panel      Francene Finders, FNP-C [place] [phone]

## 2019-01-20 ENCOUNTER — Encounter (HOSPITAL_COMMUNITY): Payer: Self-pay

## 2019-01-20 ENCOUNTER — Inpatient Hospital Stay (HOSPITAL_COMMUNITY): Payer: Self-pay

## 2019-01-20 VITALS — BP 155/73 | HR 87 | Temp 96.9°F | Resp 18

## 2019-01-20 DIAGNOSIS — Z95828 Presence of other vascular implants and grafts: Secondary | ICD-10-CM

## 2019-01-20 DIAGNOSIS — C541 Malignant neoplasm of endometrium: Secondary | ICD-10-CM

## 2019-01-20 MED ORDER — PEGFILGRASTIM-CBQV 6 MG/0.6ML ~~LOC~~ SOSY
PREFILLED_SYRINGE | SUBCUTANEOUS | Status: AC
  Filled 2019-01-20: qty 0.6

## 2019-01-20 MED ORDER — PEGFILGRASTIM-CBQV 6 MG/0.6ML ~~LOC~~ SOSY
6.0000 mg | PREFILLED_SYRINGE | Freq: Once | SUBCUTANEOUS | Status: AC
  Administered 2019-01-20: 6 mg via SUBCUTANEOUS

## 2019-01-20 NOTE — Patient Instructions (Signed)
[facility] at [facility] Discharge Instructions  Received Udenyca injection today. Follow-up as scheduled. Call clinic for any questions or concerns   Thank you for choosing [facility] at [facility] to provide your oncology and hematology care.  To afford each patient quality time with our provider, please arrive at least 15 minutes before your scheduled appointment time.   If you have a lab appointment with the Cancer Center please come in thru the Main Entrance and check in at the main information desk.  You need to re-schedule your appointment should you arrive 10 or more minutes late.  We strive to give you quality time with our providers, and arriving late affects you and other patients whose appointments are after yours.  Also, if you no show three or more times for appointments you may be dismissed from the clinic at the providers discretion.     Again, thank you for choosing [facility].  Our hope is that these requests will decrease the amount of time that you wait before being seen by our physicians.       _____________________________________________________________  Should you have questions after your visit to [facility], please contact our office at [phone] between the hours of 8:00 a.m. and 4:30 p.m.  Voicemails left after 4:00 p.m. will not be returned until the following business day.  For prescription refill requests, have your pharmacy contact our office and allow 72 hours.    Due to Covid, you will need to wear a mask upon entering the hospital. If you do not have a mask, a mask will be given to you at the Main Entrance upon arrival. For doctor visits, patients may have 1 support person with them. For treatment visits, patients can not have anyone with them due to social distancing guidelines and our immunocompromised population.     

## 2019-01-20 NOTE — Progress Notes (Signed)
Jessica [name] tolerated Udenyca injection well without complaints or incident. VSS Pt discharged self ambulatory in satisfactory condition accompanied by her daughter

## 2019-01-23 NOTE — Assessment & Plan Note (Signed)
1.  Recurrent endometrial carcinosarcoma: -Status post TAH, BSO, bilateral pelvic and para-aortic lymph node resections on 04/13/2018 -Pathology showed carcinosarcoma (malignant mixed mullerian tumor) arising in an endometrial type polyp with no myometrial invasion identified.  High-grade.  0/39 lymph nodes positive.  Pathological staging PT1APN0, FIGO stage Ia.  MMR was normal.  MSI-stable. -She had 2 to 3 days of abdominal pain and presented to the ER on 12/09/2018.  She lost 15 pounds but was trying to lose weight. -CT abdomen and pelvis on 12/09/2018 shows extensive peritoneal carcinomatosis with very aggressive appearing new soft tissue masses in the abdomen and pelvis.  There is a large conglomerate partially enhancing soft tissue mass in the pelvis measuring approximately 12 x 12 x 10 cm most concerning for recurrent endometrial carcinoma.  There is also left-sided lower abdominal mass along the omentum measuring 6.5 x 6 x 4.8 cm with associated small volume abdominal/pelvic ascites.  No disease was seen on prior CT scan from May 2020. -She was evaluated by Dr. Denman George and was recommended to start chemotherapy with carboplatin and paclitaxel. -Biopsy of the omental mass on 12/28/2018 shows poorly differentiated carcinoma consistent with her prior cancer. -CT of the chest was negative for metastatic disease. -Cycle 1 of chemotherapy with carboplatin and paclitaxel on 12/29/2018. -She reported pain in her feet, similar to a bad tooth ache for 3 days.  Denies any tingling or numbness in the extremities.  She took pain medication which helped. -Otherwise she felt tremendous improvement in her abdominal pain.  We reviewed her labs today.  White count is elevated secondary to growth factor effect. -She will come back in 2 weeks for follow-up.  We will consider discontinuing her Foley catheter at that time.  2.  Abdominal pain: -Pain in the lower quadrants have improved tremendously after cycle 1.  She is  not requiring Percocet on regular basis.  3.  Family history: -Father died of lung cancer and was a smoker.  Maternal aunt had metastatic cancer.  Patient does not know the type.  4.  Nausea and vomiting: -She is using Zofran 8 mg every 6 hours as needed.

## 2019-01-26 MED FILL — ?ATORVASTATIN 20 MG TABLET: 20 | 30 days supply | Qty: 30 | Fill #8

## 2019-02-01 ENCOUNTER — Other Ambulatory Visit (HOSPITAL_COMMUNITY): Payer: Self-pay | Admitting: Hematology

## 2019-02-09 ENCOUNTER — Encounter (HOSPITAL_COMMUNITY): Payer: Self-pay | Admitting: Hematology

## 2019-02-09 ENCOUNTER — Other Ambulatory Visit: Payer: Self-pay

## 2019-02-09 ENCOUNTER — Inpatient Hospital Stay (HOSPITAL_COMMUNITY): Payer: Self-pay | Attending: Hematology

## 2019-02-09 ENCOUNTER — Inpatient Hospital Stay (HOSPITAL_BASED_OUTPATIENT_CLINIC_OR_DEPARTMENT_OTHER): Payer: Self-pay | Admitting: Hematology

## 2019-02-09 ENCOUNTER — Inpatient Hospital Stay (HOSPITAL_COMMUNITY): Payer: Self-pay

## 2019-02-09 ENCOUNTER — Other Ambulatory Visit (HOSPITAL_COMMUNITY): Payer: Self-pay

## 2019-02-09 ENCOUNTER — Encounter (HOSPITAL_COMMUNITY): Payer: Self-pay

## 2019-02-09 VITALS — BP 129/68 | HR 70 | Temp 97.5°F | Resp 18

## 2019-02-09 VITALS — BP 123/82 | HR 78 | Temp 97.6°F | Resp 18 | Wt 212.0 lb

## 2019-02-09 DIAGNOSIS — C541 Malignant neoplasm of endometrium: Secondary | ICD-10-CM | POA: Insufficient documentation

## 2019-02-09 DIAGNOSIS — Z8249 Family history of ischemic heart disease and other diseases of the circulatory system: Secondary | ICD-10-CM | POA: Insufficient documentation

## 2019-02-09 DIAGNOSIS — Z801 Family history of malignant neoplasm of trachea, bronchus and lung: Secondary | ICD-10-CM | POA: Insufficient documentation

## 2019-02-09 DIAGNOSIS — C786 Secondary malignant neoplasm of retroperitoneum and peritoneum: Secondary | ICD-10-CM | POA: Insufficient documentation

## 2019-02-09 DIAGNOSIS — Z90722 Acquired absence of ovaries, bilateral: Secondary | ICD-10-CM | POA: Insufficient documentation

## 2019-02-09 DIAGNOSIS — Z9079 Acquired absence of other genital organ(s): Secondary | ICD-10-CM | POA: Insufficient documentation

## 2019-02-09 DIAGNOSIS — R110 Nausea: Secondary | ICD-10-CM | POA: Insufficient documentation

## 2019-02-09 DIAGNOSIS — Z5111 Encounter for antineoplastic chemotherapy: Secondary | ICD-10-CM | POA: Insufficient documentation

## 2019-02-09 DIAGNOSIS — R7989 Other specified abnormal findings of blood chemistry: Secondary | ICD-10-CM | POA: Insufficient documentation

## 2019-02-09 DIAGNOSIS — Z95828 Presence of other vascular implants and grafts: Secondary | ICD-10-CM

## 2019-02-09 DIAGNOSIS — M79606 Pain in leg, unspecified: Secondary | ICD-10-CM | POA: Insufficient documentation

## 2019-02-09 DIAGNOSIS — Z5189 Encounter for other specified aftercare: Secondary | ICD-10-CM | POA: Insufficient documentation

## 2019-02-09 DIAGNOSIS — Z79899 Other long term (current) drug therapy: Secondary | ICD-10-CM | POA: Insufficient documentation

## 2019-02-09 DIAGNOSIS — Z90710 Acquired absence of both cervix and uterus: Secondary | ICD-10-CM | POA: Insufficient documentation

## 2019-02-09 DIAGNOSIS — Z791 Long term (current) use of non-steroidal anti-inflammatories (NSAID): Secondary | ICD-10-CM | POA: Insufficient documentation

## 2019-02-09 LAB — COMPREHENSIVE METABOLIC PANEL
ALT: 51 U/L — ABNORMAL HIGH (ref 0–44)
AST: 33 U/L (ref 15–41)
Albumin: 3.8 g/dL (ref 3.5–5.0)
Alkaline Phosphatase: 141 U/L — ABNORMAL HIGH (ref 38–126)
Anion gap: 12 (ref 5–15)
BUN: 23 mg/dL — ABNORMAL HIGH (ref 6–20)
CO2: 23 mmol/L (ref 22–32)
Calcium: 8.9 mg/dL (ref 8.9–10.3)
Chloride: 105 mmol/L (ref 98–111)
Creatinine, Ser: 0.5 mg/dL (ref 0.44–1.00)
GFR calc Af Amer: >60 mL/min (ref >60)
GFR calc non Af Amer: >60 mL/min (ref >60)
Glucose, Bld: 99 mg/dL (ref 70–99)
Potassium: 3.9 mmol/L (ref 3.5–5.1)
Sodium: 140 mmol/L (ref 135–145)
Total Bilirubin: 0.3 mg/dL (ref 0.3–1.2)
Total Protein: 6.9 g/dL (ref 6.5–8.1)

## 2019-02-09 LAB — CBC WITH DIFFERENTIAL/PLATELET
Abs Immature Granulocytes: 0.02 10*3/uL (ref 0.00–0.07)
Basophils Absolute: 0.1 10*3/uL (ref 0.0–0.1)
Basophils Relative: 1 %
Eosinophils Absolute: 0.1 10*3/uL (ref 0.0–0.5)
Eosinophils Relative: 2 %
HCT: 34.5 % — ABNORMAL LOW (ref 36.0–46.0)
Hemoglobin: 11.1 g/dL — ABNORMAL LOW (ref 12.0–15.0)
Immature Granulocytes: 0 %
Lymphocytes Relative: 31 %
Lymphs Abs: 2.3 10*3/uL (ref 0.7–4.0)
MCH: 27.9 pg (ref 26.0–34.0)
MCHC: 32.2 g/dL (ref 30.0–36.0)
MCV: 86.7 fL (ref 80.0–100.0)
Monocytes Absolute: 0.6 10*3/uL (ref 0.1–1.0)
Monocytes Relative: 8 %
Neutro Abs: 4.3 10*3/uL (ref 1.7–7.7)
Neutrophils Relative %: 58 %
Platelets: 222 10*3/uL (ref 150–400)
RBC: 3.98 MIL/uL (ref 3.87–5.11)
RDW: 17.5 % — ABNORMAL HIGH (ref 11.5–15.5)
WBC: 7.3 10*3/uL (ref 4.0–10.5)
nRBC: 0 % (ref 0.0–0.2)

## 2019-02-09 LAB — MAGNESIUM: Magnesium: 1.9 mg/dL (ref 1.7–2.4)

## 2019-02-09 LAB — LACTATE DEHYDROGENASE: LDH: 160 U/L (ref 98–192)

## 2019-02-09 MED ORDER — HEPARIN SOD (PORK) LOCK FLUSH 100 UNIT/ML IV SOLN
500.0000 [IU] | Freq: Once | INTRAVENOUS | Status: AC | PRN
  Administered 2019-02-09: 500 [IU]

## 2019-02-09 MED ORDER — SODIUM CHLORIDE 0.9% FLUSH
10.0000 mL | INTRAVENOUS | Status: DC | PRN
  Administered 2019-02-09: 08:00:00 10 mL

## 2019-02-09 MED ORDER — SODIUM CHLORIDE 0.9 % IV SOLN
150.0000 mg | Freq: Once | INTRAVENOUS | Status: AC
  Administered 2019-02-09: 10:00:00 150 mg via INTRAVENOUS
  Filled 2019-02-09: qty 150

## 2019-02-09 MED ORDER — SODIUM CHLORIDE 0.9 % IV SOLN
10.0000 mg | Freq: Once | INTRAVENOUS | Status: AC
  Administered 2019-02-09: 10:00:00 10 mg via INTRAVENOUS
  Filled 2019-02-09: qty 10

## 2019-02-09 MED ORDER — SODIUM CHLORIDE 0.9 % IV SOLN
175.0000 mg/m2 | Freq: Once | INTRAVENOUS | Status: AC
  Administered 2019-02-09: 11:00:00 360 mg via INTRAVENOUS
  Filled 2019-02-09: qty 60

## 2019-02-09 MED ORDER — DICYCLOMINE HCL 20 MG PO TABS: 20.0000 mg | ORAL_TABLET | Freq: Four times a day (QID) | ORAL | 1 refills | Status: DC

## 2019-02-09 MED ORDER — PROCHLORPERAZINE MALEATE 10 MG PO TABS: 10.0000 mg | ORAL_TABLET | Freq: Four times a day (QID) | ORAL | 1 refills | Status: DC | PRN

## 2019-02-09 MED ORDER — OXYCODONE-ACETAMINOPHEN 5-325 MG PO TABS: 1.0000 | ORAL_TABLET | Freq: Three times a day (TID) | ORAL | 0 refills | Status: DC | PRN

## 2019-02-09 MED ORDER — SODIUM CHLORIDE 0.9 % IV SOLN
862.8000 mg | Freq: Once | INTRAVENOUS | Status: AC
  Administered 2019-02-09: 14:00:00 860 mg via INTRAVENOUS
  Filled 2019-02-09: qty 86

## 2019-02-09 MED ORDER — FAMOTIDINE IN NACL 20-0.9 MG/50ML-% IV SOLN
20.0000 mg | Freq: Once | INTRAVENOUS | Status: AC
  Administered 2019-02-09: 10:00:00 20 mg via INTRAVENOUS
  Filled 2019-02-09: qty 50

## 2019-02-09 MED ORDER — PALONOSETRON HCL INJECTION 0.25 MG/5ML
0.2500 mg | Freq: Once | INTRAVENOUS | Status: AC
  Administered 2019-02-09: 10:00:00 0.25 mg via INTRAVENOUS
  Filled 2019-02-09: qty 5

## 2019-02-09 MED ORDER — LIDOCAINE-PRILOCAINE 2.5-2.5 % EX CREA: TOPICAL_CREAM | CUTANEOUS | 3 refills | Status: DC

## 2019-02-09 MED ORDER — DIPHENHYDRAMINE HCL 50 MG/ML IJ SOLN
50.0000 mg | Freq: Once | INTRAMUSCULAR | Status: AC
  Administered 2019-02-09: 10:00:00 50 mg via INTRAVENOUS
  Filled 2019-02-09: qty 1

## 2019-02-09 MED ORDER — SODIUM CHLORIDE 0.9 % IV SOLN: Freq: Once | INTRAVENOUS | Status: AC

## 2019-02-09 NOTE — Progress Notes (Signed)
[place], [place] [address]   CLINIC:  Medical Oncology/Hematology  PCP:  Soyla Dryer, PA-C [place] [address] [phone]   REASON FOR VISIT:  Follow-up for recurrent endometrial carcinosarcoma.  CURRENT THERAPY: Carboplatin and paclitaxel every 3 weeks.  BRIEF ONCOLOGIC HISTORY:  Oncology History  Endometrial cancer ([place])  03/31/2018 Initial Diagnosis   Endometrial cancer ([place])   12/29/2018 -  Chemotherapy   The patient had palonosetron (ALOXI) injection 0.25 mg, 0.25 mg, Intravenous,  Once, 3 of 6 cycles Administration: 0.25 mg (12/29/2018), 0.25 mg (01/19/2019), 0.25 mg (02/09/2019) pegfilgrastim-cbqv (UDENYCA) injection 6 mg, 6 mg, Subcutaneous, Once, 3 of 6 cycles Administration: 6 mg (12/31/2018), 6 mg (01/20/2019) CARBOplatin (PARAPLATIN) 860 mg in sodium chloride 0.9 % 500 mL chemo infusion, 860 mg (100 % of original dose 862.8 mg), Intravenous,  Once, 3 of 6 cycles Dose modification:   (original dose 862.8 mg, Cycle 1),   (original dose 862.8 mg, Cycle 2),   (original dose 862.8 mg, Cycle 3) Administration: 860 mg (12/29/2018), 860 mg (01/19/2019) fosaprepitant (EMEND) 150 mg in sodium chloride 0.9 % 145 mL IVPB, 150 mg, Intravenous,  Once, 3 of 6 cycles Administration: 150 mg (01/19/2019), 150 mg (02/09/2019) PACLitaxel (TAXOL) 360 mg in sodium chloride 0.9 % 500 mL chemo infusion (> 82m/m2), 175 mg/m2 = 360 mg, Intravenous,  Once, 3 of 6 cycles Administration: 360 mg (12/29/2018), 360 mg (01/19/2019), 360 mg (02/09/2019)  for chemotherapy treatment.       CANCER STAGING: Cancer Staging No matching staging information was found for the patient.   INTERVAL HISTORY:  [name]. female seen for follow-up of endometrial adenocarcinoma and toxicity assessment prior to cycle 3. She reported low back pain and leg pains for 1 week after last treatment. She had to take Percocet 1 to 2 tablets daily.  She also had nausea but denied any vomiting. She is taking Compazine which is helping. She denies any problems urinating.    REVIEW OF SYSTEMS:  Review of Systems  Gastrointestinal: Positive for nausea.  Musculoskeletal:       Leg pains lasted 3 days after chemo.  All other systems reviewed and are negative.    PAST MEDICAL/SURGICAL HISTORY:  Past Medical History:  Diagnosis Date  . Elevated blood pressure reading   . [place]   . PMB (postmenopausal bleeding)   . Port-A-Cath in place 12/24/2018   Past Surgical History:  Procedure Laterality Date  . IR IMAGING GUIDED PORT INSERTION  12/28/2018  . IR RADIOLOGIST EVAL & MGMT  06/02/2018  . IR RADIOLOGIST EVAL & MGMT  06/16/2018  . NECK SURGERY  2000   spinal surgery , titanium rod in place   . ROBOTIC ASSISTED TOTAL HYSTERECTOMY WITH BILATERAL SALPINGO OOPHERECTOMY N/A 04/13/2018   Procedure: XI ROBOTIC ASSISTED TOTAL HYSTERECTOMY WITH BILATERAL SALPINGO OOPHORECTOMY;  Surgeon: REveritt Amber MD;  Location: [facility];  Service: Gynecology;  Laterality: N/A;  . ROBOTIC PELVIC AND PARA-AORTIC LYMPH NODE DISSECTION N/A 04/13/2018   Procedure: XI ROBOTIC PELVIC LYMPHADECTOMY AND PARA-AORTIC LYMPH NODE DISSECTION;  Surgeon: REveritt Amber MD;  Location: [facility];  Service: Gynecology;  Laterality: N/A;     SOCIAL HISTORY:  Social History   Socioeconomic History  . Marital status: Widowed    Spouse name: Not on file  . Number of children: Not on file  . Years of education: Not on file  . Highest education level: Not on file  Occupational History  . Not on file  Tobacco Use  . Smoking status: Never Smoker  . Smokeless tobacco: Never Used  Substance and Sexual Activity  . Alcohol use: Yes    Alcohol/week: 4.0 standard drinks    Types: 4 Glasses of wine per week    Comment: weekends  . Drug use: Never  . Sexual activity: Not Currently  Other Topics Concern  . Not on file  Social History Narrative  . Not on file    Social Determinants of Health   Financial Resource Strain:   . Difficulty of Paying Living Expenses: Not on file  Food Insecurity: No Food Insecurity  . Worried About Charity fundraiser in the Last Year: Never true  . Ran Out of Food in the Last Year: Never true  Transportation Needs: No Transportation Needs  . Lack of Transportation (Medical): No  . Lack of Transportation (Non-Medical): No  Physical Activity:   . Days of Exercise per Week: Not on file  . Minutes of Exercise per Session: Not on file  Stress:   . Feeling of Stress : Not on file  Social Connections:   . Frequency of Communication with Friends and Family: Not on file  . Frequency of Social Gatherings with Friends and Family: Not on file  . Attends Religious Services: Not on file  . Active Member of Clubs or Organizations: Not on file  . Attends Archivist Meetings: Not on file  . Marital Status: Not on file  Intimate Partner Violence:   . Fear of Current or Ex-Partner: Not on file  . Emotionally Abused: Not on file  . Physically Abused: Not on file  . Sexually Abused: Not on file    FAMILY HISTORY:  Family History  Problem Relation Age of Onset  . Hypertension Father   . Heart disease Father   . Cancer Father        lung  . Hypertension Mother     CURRENT MEDICATIONS:  Outpatient Encounter Medications as of 02/09/2019  Medication Sig  . amLODipine (NORVASC) 5 MG tablet Take 1 tablet (5 mg total) by mouth daily.  Marland Kitchen atorvastatin (LIPITOR) 20 MG tablet Take 1 tablet (20 mg total) by mouth daily.  Marland Kitchen CARBOPLATIN IV Inject into the vein every 21 ( twenty-one) days.  Marland Kitchen dicyclomine (BENTYL) 20 MG tablet Take 1 tablet (20 mg total) by mouth every 6 (six) hours.  Marland Kitchen ibuprofen (ADVIL) 200 MG tablet Take 200 mg by mouth every 6 (six) hours as needed.  . lidocaine-prilocaine (EMLA) cream Apply small amount to port a cath site and cover with plastic wrap one hour prior to chemotherapy appointments  .  PACLITAXEL IV Inject into the vein every 21 ( twenty-one) days.  . polyethylene glycol (MIRALAX / GLYCOLAX) 17 g packet Take 17 g by mouth 2 (two) times daily as needed.  . prochlorperazine (COMPAZINE) 10 MG tablet Take 1 tablet (10 mg total) by mouth every 6 (six) hours as needed (Nausea or vomiting).  . [DISCONTINUED] dicyclomine (BENTYL) 20 MG tablet Take 1 tablet (20 mg total) by mouth every 6 (six) hours.  . [DISCONTINUED] lidocaine-prilocaine (EMLA) cream Apply small amount to port a cath site and cover with plastic wrap one hour prior to chemotherapy appointments  . [DISCONTINUED] oxyCODONE-acetaminophen (PERCOCET) 5-325 MG tablet Take 1 tablet by mouth every 8 (eight) hours as needed (pain).  . [DISCONTINUED] prochlorperazine (COMPAZINE) 10 MG tablet Take 1 tablet (10 mg total) by mouth every 6 (  six) hours as needed (Nausea or vomiting).   No facility-administered encounter medications on file as of 02/09/2019.    ALLERGIES:  No Known Allergies   PHYSICAL EXAM:  ECOG Performance status: 1  Vitals:   02/09/19 0818  BP: 123/82  Pulse: 78  Resp: 18  Temp: 97.6 F (36.4 C)  SpO2: 100%   Filed Weights   02/09/19 0818  Weight: 212 lb (96.2 kg)    Physical Exam Vitals reviewed.  Constitutional:      Appearance: Normal appearance.  Cardiovascular:     Rate and Rhythm: Normal rate and regular rhythm.  Pulmonary:     Effort: Pulmonary effort is normal.     Breath sounds: Normal breath sounds.  Abdominal:     General: There is distension.     Palpations: Abdomen is soft. There is mass.  Skin:    General: Skin is warm.  Neurological:     General: No focal deficit present.     Mental Status: She is alert and oriented to person, place, and time.  Psychiatric:        Mood and Affect: Mood normal.        Behavior: Behavior normal.      LABORATORY DATA:  I have reviewed the labs as listed.  CBC    Component Value Date/Time   WBC 7.3 02/09/2019 0811   RBC 3.98  02/09/2019 0811   HGB 11.1 (L) 02/09/2019 0811   HCT 34.5 (L) 02/09/2019 0811   PLT 222 02/09/2019 0811   MCV 86.7 02/09/2019 0811   MCH 27.9 02/09/2019 0811   MCHC 32.2 02/09/2019 0811   RDW 17.5 (H) 02/09/2019 0811   LYMPHSABS 2.3 02/09/2019 0811   MONOABS 0.6 02/09/2019 0811   EOSABS 0.1 02/09/2019 0811   BASOSABS 0.1 02/09/2019 0811   CMP Latest Ref Rng & Units 02/09/2019 01/19/2019 01/06/2019  Glucose 70 - 99 mg/dL 99 105(H) 124(H)  BUN 6 - 20 mg/dL 23(H) 18 13  Creatinine 0.44 - 1.00 mg/dL 0.50 0.59 0.68  Sodium 135 - 145 mmol/L 140 140 141  Potassium 3.5 - 5.1 mmol/L 3.9 4.1 4.3  Chloride 98 - 111 mmol/L 105 106 101  CO2 22 - 32 mmol/L 23 24 26   Calcium 8.9 - 10.3 mg/dL 8.9 9.0 9.4  Total Protein 6.5 - 8.1 g/dL 6.9 7.1 7.4  Total Bilirubin 0.3 - 1.2 mg/dL 0.3 0.3 0.4  Alkaline Phos 38 - 126 U/L 141(H) 124 204(H)  AST 15 - 41 U/L 33 29 30  ALT 0 - 44 U/L 51(H) 44 37       DIAGNOSTIC IMAGING:  I have independently reviewed the scans and discussed with the patient.   ASSESSMENT & PLAN:   Endometrial cancer ([place]) 1.  Recurrent endometrial carcinosarcoma: -Status post TAH, BSO, bilateral pelvic and para-aortic lymph node resection on 04/13/2018. -Pathology showed carcinosarcoma (malignant mixed mullerian tumor) arising in an endometrial type polyp with no myometrial invasion identified.  High-grade.  0/39 lymph nodes positive.  Pathological staging PT1APN0, FIGO stage Ia.  MMR was normal.  MSI stable. -She had 2 to 3 days of abdominal pain and presented to the ER on 12/09/2018.  She had lost 15 pounds but was trying to lose weight at that time. -CT abdomen pelvis on 12/09/2018 showed extensive peritoneal carcinomatosis with very aggressive appearing new soft tissue masses in the abdomen and pelvis.  There is a large conglomerate partially enhancing soft tissue mass in the pelvis measuring approximately 12 x 12  x 10 cm most concerning for recurrent endometrial carcinoma.   There is also abdominal/pelvic ascites.  No disease was seen on prior CT scans from May 2020. -She was evaluated by Dr. Denman George and was recommended to start chemotherapy with carboplatin and paclitaxel. -Biopsy of the omental mass on 12/28/2018 showed poorly differentiated carcinoma consistent with her prior cancer. -CT chest was negative for metastatic disease. -2 cycles of carboplatin and paclitaxel on 12/29/2018 and 01/19/2019. -We reviewed her labs. She will proceed with cycle 3 today. -We will plan to repeat her CT of the abdomen and pelvis in 3 weeks prior to next cycle. We will also repeat tumor marker.  2. Leg pains: -She reported complete improvement in abdominal pain since the start of therapy. -However she has low back pain. She also has feet and leg pains lasting for 1 week after each treatment. She is taking Percocet 1 to 2 tablets daily during those days.  3.  Difficulty urinating: -Foley was discontinued. She is urinating well.  4.  Nausea and vomiting: -She has occasional nausea but denied any vomiting. -She has Compazine and Zofran to use it.  5. Mildly elevated LFTs: -ALT is 51. Alk phos is 141. This is higher than before. We will keep a close eye on it.      Orders placed this encounter:  Orders Placed This Encounter  Procedures  . CT Abdomen Pelvis W Contrast  . CBC with Differential/Platelet  . Comprehensive metabolic panel  . CA [place], MD [place] [phone]

## 2019-02-09 NOTE — Progress Notes (Signed)
To treatment room for oncology follow up and chemotherapy.  Patient denied numbness or tingling in fingers and toes.  Stated back discomfort and uses ibuprofen for relief.  No other complaints voiced.  No s/s of distress noted.  Dr. Delton Coombes notified for visit.    Patient seen by Dr. Delton Coombes with lab review and ok to treat today verbal order.   Patient tolerated chemotherapy with no complaints voiced.  Port site clean and dry with no bruising or swelling noted at site.  Good blood return noted before and after administration of chemotherapy.  Band aid applied.  Patient left ambulatory with VSS and no s/s of distress noted.

## 2019-02-09 NOTE — Patient Instructions (Signed)
[place] at [facility] Discharge Instructions  You were seen today by Dr. Delton Coombes. He went over your recent lab results. He will schedule you for a repeat CT scan. He will see you back in 3 weeks for labs, treatment and follow up.   Thank you for choosing [place] at [facility] to provide your oncology and hematology care.  To afford each patient quality time with our provider, please arrive at least 15 minutes before your scheduled appointment time.   If you have a lab appointment with the [place] please come in thru the  Main Entrance and check in at the main information desk  You need to re-schedule your appointment should you arrive 10 or more minutes late.  We strive to give you quality time with our providers, and arriving late affects you and other patients whose appointments are after yours.  Also, if you no show three or more times for appointments you may be dismissed from the clinic at the providers discretion.     Again, thank you for choosing [facility].  Our hope is that these requests will decrease the amount of time that you wait before being seen by our physicians.       _____________________________________________________________  Should you have questions after your visit to [facility], please contact our office at [phone] between the hours of 8:00 a.m. and 4:30 p.m.  Voicemails left after 4:00 p.m. will not be returned until the following business day.  For prescription refill requests, have your pharmacy contact our office and allow 72 hours.    Cancer Center Support Programs:   > Cancer Support Group  2nd Tuesday of the month 1pm-2pm, Journey Room

## 2019-02-09 NOTE — Patient Instructions (Signed)
[facility] at [facility] Discharge Instructions  Labs drawn from portacath today   Thank you for choosing [facility] at [facility] to provide your oncology and hematology care.  To afford each patient quality time with our provider, please arrive at least 15 minutes before your scheduled appointment time.   If you have a lab appointment with the Cancer Center please come in thru the Main Entrance and check in at the main information desk.  You need to re-schedule your appointment should you arrive 10 or more minutes late.  We strive to give you quality time with our providers, and arriving late affects you and other patients whose appointments are after yours.  Also, if you no show three or more times for appointments you may be dismissed from the clinic at the providers discretion.     Again, thank you for choosing [facility].  Our hope is that these requests will decrease the amount of time that you wait before being seen by our physicians.       _____________________________________________________________  Should you have questions after your visit to [facility], please contact our office at [phone] between the hours of 8:00 a.m. and 4:30 p.m.  Voicemails left after 4:00 p.m. will not be returned until the following business day.  For prescription refill requests, have your pharmacy contact our office and allow 72 hours.    Due to Covid, you will need to wear a mask upon entering the hospital. If you do not have a mask, a mask will be given to you at the Main Entrance upon arrival. For doctor visits, patients may have 1 support person with them. For treatment visits, patients can not have anyone with them due to social distancing guidelines and our immunocompromised population.     

## 2019-02-09 NOTE — Progress Notes (Signed)
Patient has been assessed, vital signs and labs have been reviewed by Dr. Katragadda. ANC, Creatinine, LFTs, and Platelets are within treatment parameters per Dr. Katragadda. The patient is good to proceed with treatment at this time.  

## 2019-02-09 NOTE — Assessment & Plan Note (Addendum)
1.  Recurrent endometrial carcinosarcoma: -Status post TAH, BSO, bilateral pelvic and para-aortic lymph node resection on 04/13/2018. -Pathology showed carcinosarcoma (malignant mixed mullerian tumor) arising in an endometrial type polyp with no myometrial invasion identified.  High-grade.  0/39 lymph nodes positive.  Pathological staging PT1APN0, FIGO stage Ia.  MMR was normal.  MSI stable. -She had 2 to 3 days of abdominal pain and presented to the ER on 12/09/2018.  She had lost 15 pounds but was trying to lose weight at that time. -CT abdomen pelvis on 12/09/2018 showed extensive peritoneal carcinomatosis with very aggressive appearing new soft tissue masses in the abdomen and pelvis.  There is a large conglomerate partially enhancing soft tissue mass in the pelvis measuring approximately 12 x 12 x 10 cm most concerning for recurrent endometrial carcinoma.  There is also abdominal/pelvic ascites.  No disease was seen on prior CT scans from May 2020. -She was evaluated by Dr. Rossi and was recommended to start chemotherapy with carboplatin and paclitaxel. -Biopsy of the omental mass on 12/28/2018 showed poorly differentiated carcinoma consistent with her prior cancer. -CT chest was negative for metastatic disease. -2 cycles of carboplatin and paclitaxel on 12/29/2018 and 01/19/2019. -We reviewed her labs. She will proceed with cycle 3 today. -We will plan to repeat her CT of the abdomen and pelvis in 3 weeks prior to next cycle. We will also repeat tumor marker.  2. Leg pains: -She reported complete improvement in abdominal pain since the start of therapy. -However she has low back pain. She also has feet and leg pains lasting for 1 week after each treatment. She is taking Percocet 1 to 2 tablets daily during those days.  3.  Difficulty urinating: -Foley was discontinued. She is urinating well.  4.  Nausea and vomiting: -She has occasional nausea but denied any vomiting. -She has Compazine and  Zofran to use it.  5. Mildly elevated LFTs: -ALT is 51. Alk phos is 141. This is higher than before. We will keep a close eye on it. 

## 2019-02-11 ENCOUNTER — Other Ambulatory Visit: Payer: Self-pay

## 2019-02-11 ENCOUNTER — Encounter (HOSPITAL_COMMUNITY): Payer: Self-pay

## 2019-02-11 ENCOUNTER — Inpatient Hospital Stay (HOSPITAL_COMMUNITY): Payer: Self-pay

## 2019-02-11 VITALS — BP 133/65 | HR 66 | Temp 96.9°F | Resp 18

## 2019-02-11 DIAGNOSIS — C541 Malignant neoplasm of endometrium: Secondary | ICD-10-CM

## 2019-02-11 DIAGNOSIS — Z95828 Presence of other vascular implants and grafts: Secondary | ICD-10-CM

## 2019-02-11 MED ORDER — PEGFILGRASTIM-CBQV 6 MG/0.6ML ~~LOC~~ SOSY
6.0000 mg | PREFILLED_SYRINGE | Freq: Once | SUBCUTANEOUS | Status: AC
  Administered 2019-02-11: 12:00:00 6 mg via SUBCUTANEOUS

## 2019-02-11 MED ORDER — PEGFILGRASTIM-CBQV 6 MG/0.6ML ~~LOC~~ SOSY
PREFILLED_SYRINGE | SUBCUTANEOUS | Status: AC
  Filled 2019-02-11: qty 0.6

## 2019-02-11 NOTE — Progress Notes (Signed)
Jessica [name] presents today for injection per MD orders. Udenyca administered SQ in left Upper Arm. Administration without incident. Patient tolerated well.  Udenyca injection  given today per MD orders. Tolerated without adverse affects. Vital signs stable. No complaints at this time. Discharged from clinic ambulatory. F/U with [facility] as scheduled.

## 2019-02-28 ENCOUNTER — Ambulatory Visit (HOSPITAL_COMMUNITY)
Admission: RE | Admit: 2019-02-28 | Discharge: 2019-02-28 | Disposition: A | Discharge status: Home / Self Care | Payer: Medicaid Other | Source: Ambulatory Visit | Attending: Hematology | Admitting: Hematology

## 2019-02-28 ENCOUNTER — Other Ambulatory Visit: Payer: Self-pay

## 2019-02-28 DIAGNOSIS — C541 Malignant neoplasm of endometrium: Secondary | ICD-10-CM | POA: Diagnosis not present

## 2019-02-28 MED ORDER — IOHEXOL 300 MG/ML  SOLN
100.0000 mL | Freq: Once | INTRAMUSCULAR | Status: AC | PRN
  Administered 2019-02-28: 08:00:00 100 mL via INTRAVENOUS

## 2019-03-02 ENCOUNTER — Other Ambulatory Visit: Payer: Self-pay

## 2019-03-02 ENCOUNTER — Encounter (HOSPITAL_COMMUNITY): Payer: Self-pay | Admitting: Hematology

## 2019-03-02 ENCOUNTER — Encounter (HOSPITAL_COMMUNITY): Payer: Self-pay

## 2019-03-02 ENCOUNTER — Inpatient Hospital Stay (HOSPITAL_BASED_OUTPATIENT_CLINIC_OR_DEPARTMENT_OTHER): Payer: Medicaid Other | Admitting: Hematology

## 2019-03-02 ENCOUNTER — Inpatient Hospital Stay (HOSPITAL_COMMUNITY): Payer: Medicaid Other

## 2019-03-02 ENCOUNTER — Inpatient Hospital Stay (HOSPITAL_COMMUNITY): Payer: Medicaid Other | Attending: Hematology

## 2019-03-02 VITALS — BP 121/68 | HR 71 | Temp 98.4°F | Resp 18

## 2019-03-02 DIAGNOSIS — C541 Malignant neoplasm of endometrium: Secondary | ICD-10-CM

## 2019-03-02 DIAGNOSIS — Z791 Long term (current) use of non-steroidal anti-inflammatories (NSAID): Secondary | ICD-10-CM | POA: Insufficient documentation

## 2019-03-02 DIAGNOSIS — Z90722 Acquired absence of ovaries, bilateral: Secondary | ICD-10-CM | POA: Insufficient documentation

## 2019-03-02 DIAGNOSIS — Z801 Family history of malignant neoplasm of trachea, bronchus and lung: Secondary | ICD-10-CM | POA: Diagnosis not present

## 2019-03-02 DIAGNOSIS — Z9079 Acquired absence of other genital organ(s): Secondary | ICD-10-CM | POA: Insufficient documentation

## 2019-03-02 DIAGNOSIS — M79606 Pain in leg, unspecified: Secondary | ICD-10-CM | POA: Diagnosis not present

## 2019-03-02 DIAGNOSIS — I10 Essential (primary) hypertension: Secondary | ICD-10-CM | POA: Insufficient documentation

## 2019-03-02 DIAGNOSIS — Z90710 Acquired absence of both cervix and uterus: Secondary | ICD-10-CM | POA: Insufficient documentation

## 2019-03-02 DIAGNOSIS — Z8249 Family history of ischemic heart disease and other diseases of the circulatory system: Secondary | ICD-10-CM | POA: Diagnosis not present

## 2019-03-02 DIAGNOSIS — Z79899 Other long term (current) drug therapy: Secondary | ICD-10-CM | POA: Diagnosis not present

## 2019-03-02 DIAGNOSIS — Z5111 Encounter for antineoplastic chemotherapy: Secondary | ICD-10-CM | POA: Diagnosis not present

## 2019-03-02 DIAGNOSIS — R112 Nausea with vomiting, unspecified: Secondary | ICD-10-CM | POA: Diagnosis not present

## 2019-03-02 DIAGNOSIS — Z95828 Presence of other vascular implants and grafts: Secondary | ICD-10-CM

## 2019-03-02 LAB — CBC WITH DIFFERENTIAL/PLATELET
Abs Immature Granulocytes: 0.02 10*3/uL (ref 0.00–0.07)
Basophils Absolute: 0.1 10*3/uL (ref 0.0–0.1)
Basophils Relative: 1 %
Eosinophils Absolute: 0.1 10*3/uL (ref 0.0–0.5)
Eosinophils Relative: 2 %
HCT: 34.5 % — ABNORMAL LOW (ref 36.0–46.0)
Hemoglobin: 11.1 g/dL — ABNORMAL LOW (ref 12.0–15.0)
Immature Granulocytes: 0 %
Lymphocytes Relative: 35 %
Lymphs Abs: 2.1 10*3/uL (ref 0.7–4.0)
MCH: 28.4 pg (ref 26.0–34.0)
MCHC: 32.2 g/dL (ref 30.0–36.0)
MCV: 88.2 fL (ref 80.0–100.0)
Monocytes Absolute: 0.5 10*3/uL (ref 0.1–1.0)
Monocytes Relative: 9 %
Neutro Abs: 3.2 10*3/uL (ref 1.7–7.7)
Neutrophils Relative %: 53 %
Platelets: 270 10*3/uL (ref 150–400)
RBC: 3.91 MIL/uL (ref 3.87–5.11)
RDW: 20.6 % — ABNORMAL HIGH (ref 11.5–15.5)
WBC: 6 10*3/uL (ref 4.0–10.5)
nRBC: 0 % (ref 0.0–0.2)

## 2019-03-02 LAB — COMPREHENSIVE METABOLIC PANEL
ALT: 28 U/L (ref 0–44)
AST: 20 U/L (ref 15–41)
Albumin: 3.9 g/dL (ref 3.5–5.0)
Alkaline Phosphatase: 124 U/L (ref 38–126)
Anion gap: 7 (ref 5–15)
BUN: 14 mg/dL (ref 6–20)
CO2: 26 mmol/L (ref 22–32)
Calcium: 9 mg/dL (ref 8.9–10.3)
Chloride: 106 mmol/L (ref 98–111)
Creatinine, Ser: 0.52 mg/dL (ref 0.44–1.00)
GFR calc Af Amer: >60 mL/min (ref >60)
GFR calc non Af Amer: >60 mL/min (ref >60)
Glucose, Bld: 142 mg/dL — ABNORMAL HIGH (ref 70–99)
Potassium: 3.7 mmol/L (ref 3.5–5.1)
Sodium: 139 mmol/L (ref 135–145)
Total Bilirubin: 0.4 mg/dL (ref 0.3–1.2)
Total Protein: 6.8 g/dL (ref 6.5–8.1)

## 2019-03-02 MED ORDER — SODIUM CHLORIDE 0.9 % IV SOLN
150.0000 mg | Freq: Once | INTRAVENOUS | Status: AC
  Administered 2019-03-02: 150 mg via INTRAVENOUS
  Filled 2019-03-02: qty 150

## 2019-03-02 MED ORDER — FAMOTIDINE IN NACL 20-0.9 MG/50ML-% IV SOLN
20.0000 mg | Freq: Once | INTRAVENOUS | Status: AC
  Administered 2019-03-02: 20 mg via INTRAVENOUS
  Filled 2019-03-02: qty 50

## 2019-03-02 MED ORDER — SODIUM CHLORIDE 0.9 % IV SOLN: Freq: Once | INTRAVENOUS | Status: AC

## 2019-03-02 MED ORDER — PALONOSETRON HCL INJECTION 0.25 MG/5ML
INTRAVENOUS | Status: AC
  Filled 2019-03-02: qty 5

## 2019-03-02 MED ORDER — DIPHENHYDRAMINE HCL 50 MG/ML IJ SOLN
50.0000 mg | Freq: Once | INTRAMUSCULAR | Status: AC
  Administered 2019-03-02: 50 mg via INTRAVENOUS
  Filled 2019-03-02: qty 1

## 2019-03-02 MED ORDER — SODIUM CHLORIDE 0.9 % IV SOLN
175.0000 mg/m2 | Freq: Once | INTRAVENOUS | Status: AC
  Administered 2019-03-02: 360 mg via INTRAVENOUS
  Filled 2019-03-02: qty 60

## 2019-03-02 MED ORDER — SODIUM CHLORIDE 0.9 % IV SOLN
10.0000 mg | Freq: Once | INTRAVENOUS | Status: AC
  Administered 2019-03-02: 10 mg via INTRAVENOUS
  Filled 2019-03-02: qty 10

## 2019-03-02 MED ORDER — PALONOSETRON HCL INJECTION 0.25 MG/5ML
0.2500 mg | Freq: Once | INTRAVENOUS | Status: AC
  Administered 2019-03-02: 0.25 mg via INTRAVENOUS

## 2019-03-02 MED ORDER — HEPARIN SOD (PORK) LOCK FLUSH 100 UNIT/ML IV SOLN
500.0000 [IU] | Freq: Once | INTRAVENOUS | Status: AC | PRN
  Administered 2019-03-02: 500 [IU]

## 2019-03-02 MED ORDER — SODIUM CHLORIDE 0.9% FLUSH
10.0000 mL | INTRAVENOUS | Status: DC | PRN
  Administered 2019-03-02: 08:00:00 10 mL

## 2019-03-02 MED ORDER — SODIUM CHLORIDE 0.9 % IV SOLN
862.8000 mg | Freq: Once | INTRAVENOUS | Status: AC
  Administered 2019-03-02: 15:00:00 860 mg via INTRAVENOUS
  Filled 2019-03-02: qty 86

## 2019-03-02 NOTE — Progress Notes (Signed)
[place], [place] [address]   CLINIC:  Medical Oncology/Hematology  PCP:  Soyla Dryer, PA-C [place] [address] [phone]   REASON FOR VISIT:  Follow-up for recurrent endometrial carcinosarcoma.  CURRENT THERAPY: Carboplatin and paclitaxel every 3 weeks.  BRIEF ONCOLOGIC HISTORY:  Oncology History  Endometrial cancer ([place])  03/31/2018 Initial Diagnosis   Endometrial cancer ([facility])   12/29/2018 -  Chemotherapy   The patient had palonosetron (ALOXI) injection 0.25 mg, 0.25 mg, Intravenous,  Once, 4 of 6 cycles Administration: 0.25 mg (12/29/2018), 0.25 mg (01/19/2019), 0.25 mg (02/09/2019), 0.25 mg (03/02/2019) pegfilgrastim-cbqv (UDENYCA) injection 6 mg, 6 mg, Subcutaneous, Once, 4 of 6 cycles Administration: 6 mg (12/31/2018), 6 mg (01/20/2019), 6 mg (02/11/2019) CARBOplatin (PARAPLATIN) 860 mg in sodium chloride 0.9 % 500 mL chemo infusion, 860 mg (100 % of original dose 862.8 mg), Intravenous,  Once, 4 of 6 cycles Dose modification:   (original dose 862.8 mg, Cycle 1),   (original dose 862.8 mg, Cycle 2),   (original dose 862.8 mg, Cycle 3),   (original dose 862.8 mg, Cycle 4) Administration: 860 mg (12/29/2018), 860 mg (01/19/2019), 860 mg (02/09/2019), 860 mg (03/02/2019) fosaprepitant (EMEND) 150 mg in sodium chloride 0.9 % 145 mL IVPB, 150 mg, Intravenous,  Once, 4 of 6 cycles Administration: 150 mg (01/19/2019), 150 mg (02/09/2019), 150 mg (03/02/2019) PACLitaxel (TAXOL) 360 mg in sodium chloride 0.9 % 500 mL chemo infusion (> 80mg /m2), 175 mg/m2 = 360 mg, Intravenous,  Once, 4 of 6 cycles Administration: 360 mg (12/29/2018), 360 mg (01/19/2019), 360 mg (02/09/2019), 360 mg (03/02/2019)  for chemotherapy treatment.       CANCER STAGING: Cancer Staging No matching staging information was found for the patient.   INTERVAL HISTORY:  Ms. Oldenkamp 59 y.o. female seen for follow-up of endometrial adenocarcinoma and  toxicity assessment prior to cycle 4 of chemotherapy.  She had cycle 3 on 02/09/2019.  She also had CT scan done for response evaluation.  Reports achy leg pains during the first week after each treatment.  She also has on and off tingling or numbness in the hands and feet.  Occasional nausea is well controlled with Compazine.  She did have diarrhea for few days which is improved.  Feels like her bladder is not emptying completely.   REVIEW OF SYSTEMS:  Review of Systems  Gastrointestinal: Positive for nausea.  Genitourinary: Positive for difficulty urinating.   Musculoskeletal:       Leg pains lasted 3 days after chemo.  Neurological: Positive for numbness.  All other systems reviewed and are negative.    PAST MEDICAL/SURGICAL HISTORY:  Past Medical History:  Diagnosis Date  . Elevated blood pressure reading   . Endometrial cancer ([place])   . PMB (postmenopausal bleeding)   . Port-A-Cath in place 12/24/2018   Past Surgical History:  Procedure Laterality Date  . IR IMAGING GUIDED PORT INSERTION  12/28/2018  . IR RADIOLOGIST EVAL & MGMT  06/02/2018  . IR RADIOLOGIST EVAL & MGMT  06/16/2018  . NECK SURGERY  2000   spinal surgery , titanium rod in place   . ROBOTIC ASSISTED TOTAL HYSTERECTOMY WITH BILATERAL SALPINGO OOPHERECTOMY N/A 04/13/2018   Procedure: XI ROBOTIC ASSISTED TOTAL HYSTERECTOMY WITH BILATERAL SALPINGO OOPHORECTOMY;  Surgeon: Everitt Amber, MD;  Location: [facility];  Service: Gynecology;  Laterality: N/A;  . ROBOTIC PELVIC AND PARA-AORTIC LYMPH NODE DISSECTION N/A 04/13/2018   Procedure: XI ROBOTIC PELVIC LYMPHADECTOMY AND PARA-AORTIC LYMPH  NODE DISSECTION;  Surgeon: Everitt Amber, MD;  Location: [facility];  Service: Gynecology;  Laterality: N/A;     SOCIAL HISTORY:  Social History   Socioeconomic History  . Marital status: Widowed    Spouse name: Not on file  . Number of children: Not on file  . Years of education: Not on file  . Highest education level: Not on file   Occupational History  . Not on file  Tobacco Use  . Smoking status: Never Smoker  . Smokeless tobacco: Never Used  Substance and Sexual Activity  . Alcohol use: Yes    Alcohol/week: 4.0 standard drinks    Types: 4 Glasses of wine per week    Comment: weekends  . Drug use: Never  . Sexual activity: Not Currently  Other Topics Concern  . Not on file  Social History Narrative  . Not on file   Social Determinants of Health   Financial Resource Strain:   . Difficulty of Paying Living Expenses: Not on file  Food Insecurity: No Food Insecurity  . Worried About Charity fundraiser in the Last Year: Never true  . Ran Out of Food in the Last Year: Never true  Transportation Needs: No Transportation Needs  . Lack of Transportation (Medical): No  . Lack of Transportation (Non-Medical): No  Physical Activity:   . Days of Exercise per Week: Not on file  . Minutes of Exercise per Session: Not on file  Stress:   . Feeling of Stress : Not on file  Social Connections:   . Frequency of Communication with Friends and Family: Not on file  . Frequency of Social Gatherings with Friends and Family: Not on file  . Attends Religious Services: Not on file  . Active Member of Clubs or Organizations: Not on file  . Attends Archivist Meetings: Not on file  . Marital Status: Not on file  Intimate Partner Violence:   . Fear of Current or Ex-Partner: Not on file  . Emotionally Abused: Not on file  . Physically Abused: Not on file  . Sexually Abused: Not on file    FAMILY HISTORY:  Family History  Problem Relation Age of Onset  . Hypertension Father   . Heart disease Father   . Cancer Father        lung  . Hypertension Mother     CURRENT MEDICATIONS:  Outpatient Encounter Medications as of 03/02/2019  Medication Sig  . amLODipine (NORVASC) 5 MG tablet Take 1 tablet (5 mg total) by mouth daily.  Marland Kitchen atorvastatin (LIPITOR) 20 MG tablet Take 1 tablet (20 mg total) by mouth daily.  Marland Kitchen  CARBOPLATIN IV Inject into the vein every 21 ( twenty-one) days.  Marland Kitchen dicyclomine (BENTYL) 20 MG tablet Take 1 tablet (20 mg total) by mouth every 6 (six) hours.  Marland Kitchen ibuprofen (ADVIL) 200 MG tablet Take 200 mg by mouth every 6 (six) hours as needed.  . lidocaine-prilocaine (EMLA) cream Apply small amount to port a cath site and cover with plastic wrap one hour prior to chemotherapy appointments  . oxyCODONE-acetaminophen (PERCOCET) 5-325 MG tablet Take 1 tablet by mouth every 8 (eight) hours as needed (pain).  Marland Kitchen PACLITAXEL IV Inject into the vein every 21 ( twenty-one) days.  . polyethylene glycol (MIRALAX / GLYCOLAX) 17 g packet Take 17 g by mouth 2 (two) times daily as needed.  . prochlorperazine (COMPAZINE) 10 MG tablet Take 1 tablet (10 mg total) by mouth every 6 (six) hours as  needed (Nausea or vomiting).   No facility-administered encounter medications on file as of 03/02/2019.    ALLERGIES:  No Known Allergies   PHYSICAL EXAM:  ECOG Performance status: 1  Vitals:   03/02/19 0756  BP: (!) 141/74  Pulse: 71  Resp: 18  Temp: (!) 97.1 F (36.2 C)  SpO2: 99%   Filed Weights   03/02/19 0756  Weight: 215 lb (97.5 kg)    Physical Exam Vitals reviewed.  Constitutional:      Appearance: Normal appearance.  Cardiovascular:     Rate and Rhythm: Normal rate and regular rhythm.  Pulmonary:     Effort: Pulmonary effort is normal.     Breath sounds: Normal breath sounds.  Abdominal:     General: There is distension.     Palpations: Abdomen is soft. There is mass.  Skin:    General: Skin is warm.  Neurological:     General: No focal deficit present.     Mental Status: She is alert and oriented to person, place, and time.  Psychiatric:        Mood and Affect: Mood normal.        Behavior: Behavior normal.      LABORATORY DATA:  I have reviewed the labs as listed.  CBC    Component Value Date/Time   WBC 6.0 03/02/2019 0755   RBC 3.91 03/02/2019 0755   HGB 11.1 (L)  03/02/2019 0755   HCT 34.5 (L) 03/02/2019 0755   PLT 270 03/02/2019 0755   MCV 88.2 03/02/2019 0755   MCH 28.4 03/02/2019 0755   MCHC 32.2 03/02/2019 0755   RDW 20.6 (H) 03/02/2019 0755   LYMPHSABS 2.1 03/02/2019 0755   MONOABS 0.5 03/02/2019 0755   EOSABS 0.1 03/02/2019 0755   BASOSABS 0.1 03/02/2019 0755   CMP Latest Ref Rng & Units 03/02/2019 02/09/2019 01/19/2019  Glucose 70 - 99 mg/dL 142(H) 99 105(H)  BUN 6 - 20 mg/dL 14 23(H) 18  Creatinine 0.44 - 1.00 mg/dL 0.52 0.50 0.59  Sodium 135 - 145 mmol/L 139 140 140  Potassium 3.5 - 5.1 mmol/L 3.7 3.9 4.1  Chloride 98 - 111 mmol/L 106 105 106  CO2 22 - 32 mmol/L 26 23 24   Calcium 8.9 - 10.3 mg/dL 9.0 8.9 9.0  Total Protein 6.5 - 8.1 g/dL 6.8 6.9 7.1  Total Bilirubin 0.3 - 1.2 mg/dL 0.4 0.3 0.3  Alkaline Phos 38 - 126 U/L 124 141(H) 124  AST 15 - 41 U/L 20 33 29  ALT 0 - 44 U/L 28 51(H) 44       DIAGNOSTIC IMAGING:  I have independently reviewed the scans and discussed with the patient.   ASSESSMENT & PLAN:   Endometrial cancer ([place]) 1.  Recurrent endometrial carcinosarcoma: -3 cycles of carboplatin and paclitaxel from 12/29/2018 through 02/09/2019. -We reviewed CT CAP results from 02/28/2019.  Central pelvic mass has decreased in size measuring 2.6 x 2.6 cm. -CA-125 has come down to 7 from 127. -She complains of incomplete bladder evacuation occasionally. -I have reviewed her labs.  White count is 6.  Creatinine is 0.5.  Potassium is 3.7.  LFTs are within normal limits.  They were mildly elevated previously. -She will proceed with cycle 4 today.  We will see her back in 3 weeks for follow-up.  2.  Leg pains: -She has achy leg pains in the first week after each treatment.  She is taking Percocet 3 to 4 tablets/week. -She also has some tingling and  numbness in the hands and feet for few days.  We will keep a close eye on it, as Taxol can contribute to this.  3.  Difficulty urinating: -This has improved tremendously.  Foley  was discontinued after first cycle.  She sometimes feels like bladder is not evacuated completely.  4.  Nausea and vomiting: -She has occasional nausea in the first week which is well controlled with Compazine.      Orders placed this encounter:  No orders of the defined types were placed in this encounter.     [name], MD [place] [phone]

## 2019-03-02 NOTE — Patient Instructions (Signed)
[facility] at [facility] Discharge Instructions  Labs drawn from portacath today   Thank you for choosing [facility] at [facility] to provide your oncology and hematology care.  To afford each patient quality time with our provider, please arrive at least 15 minutes before your scheduled appointment time.   If you have a lab appointment with the Cancer Center please come in thru the Main Entrance and check in at the main information desk.  You need to re-schedule your appointment should you arrive 10 or more minutes late.  We strive to give you quality time with our providers, and arriving late affects you and other patients whose appointments are after yours.  Also, if you no show three or more times for appointments you may be dismissed from the clinic at the providers discretion.     Again, thank you for choosing [facility].  Our hope is that these requests will decrease the amount of time that you wait before being seen by our physicians.       _____________________________________________________________  Should you have questions after your visit to [facility], please contact our office at [phone] between the hours of 8:00 a.m. and 4:30 p.m.  Voicemails left after 4:00 p.m. will not be returned until the following business day.  For prescription refill requests, have your pharmacy contact our office and allow 72 hours.    Due to Covid, you will need to wear a mask upon entering the hospital. If you do not have a mask, a mask will be given to you at the Main Entrance upon arrival. For doctor visits, patients may have 1 support person with them. For treatment visits, patients can not have anyone with them due to social distancing guidelines and our immunocompromised population.     

## 2019-03-02 NOTE — Patient Instructions (Signed)
[facility] Discharge Instructions for Patients Receiving Chemotherapy   Beginning January 23rd 2017 lab work for the Cancer Center will be done in the  Main lab at [facility] on 1st floor. If you have a lab appointment with the Cancer Center please come in thru the  Main Entrance and check in at the main information desk   Today you received the following chemotherapy agents Taxol and Carboplatin. Follow-up as scheduled. Call clinic for any questions or concerns  To help prevent nausea and vomiting after your treatment, we encourage you to take your nausea medication.   If you develop nausea and vomiting, or diarrhea that is not controlled by your medication, call the clinic.  The clinic phone number is [phone]. Office hours are Monday-Friday 8:30am-5:00pm.  BELOW ARE SYMPTOMS THAT SHOULD BE REPORTED IMMEDIATELY:  *FEVER GREATER THAN 101.0 F  *CHILLS WITH OR WITHOUT FEVER  NAUSEA AND VOMITING THAT IS NOT CONTROLLED WITH YOUR NAUSEA MEDICATION  *UNUSUAL SHORTNESS OF BREATH  *UNUSUAL BRUISING OR BLEEDING  TENDERNESS IN MOUTH AND THROAT WITH OR WITHOUT PRESENCE OF ULCERS  *URINARY PROBLEMS  *BOWEL PROBLEMS  UNUSUAL RASH Items with * indicate a potential emergency and should be followed up as soon as possible. If you have an emergency after office hours please contact your primary care physician or go to the nearest emergency department.  Please call the clinic during office hours if you have any questions or concerns.   You may also contact the Patient Navigator at [phone] should you have any questions or need assistance in obtaining follow up care.      Resources For Cancer Patients and their Caregivers ? American Cancer Society: Can assist with transportation, wigs, general needs, runs Look Good Feel Better.        1-888-227-6333 ? Cancer Care: Provides financial assistance, online support groups, medication/co-pay assistance.   1-800-813-HOPE (4673) ? [facility] Assists [place] cancer patients and their families through emotional , educational and financial support.  336-427-4357 ? [place] DSS Where to apply for food stamps, Medicaid and utility assistance. 336-342-1394 ? [facility]: Transportation to medical appointments. 336-347-2287 ? Social Security Administration: May apply for disability if have a Stage IV cancer. 336-342-7796 1-800-772-1213 ? [place] Aging, Disability and Transit Services: Assists with nutrition, care and transit needs. 336-349-2343         

## 2019-03-02 NOTE — Progress Notes (Signed)
Patient has been assessed, vital signs and labs have been reviewed by Dr. Katragadda. ANC, Creatinine, LFTs, and Platelets are within treatment parameters per Dr. Katragadda. The patient is good to proceed with treatment at this time.  

## 2019-03-02 NOTE — Patient Instructions (Signed)
[facility] at [facility] Discharge Instructions  You were seen today by Dr. Katragadda. He went over your recent lab results. He will see you back in 3 weeks for labs, treatment and follow up.   Thank you for choosing [facility] at [facility] to provide your oncology and hematology care.  To afford each patient quality time with our provider, please arrive at least 15 minutes before your scheduled appointment time.   If you have a lab appointment with the Cancer Center please come in thru the  Main Entrance and check in at the main information desk  You need to re-schedule your appointment should you arrive 10 or more minutes late.  We strive to give you quality time with our providers, and arriving late affects you and other patients whose appointments are after yours.  Also, if you no show three or more times for appointments you may be dismissed from the clinic at the providers discretion.     Again, thank you for choosing [facility].  Our hope is that these requests will decrease the amount of time that you wait before being seen by our physicians.       _____________________________________________________________  Should you have questions after your visit to [facility], please contact our office at [phone] between the hours of 8:00 a.m. and 4:30 p.m.  Voicemails left after 4:00 p.m. will not be returned until the following business day.  For prescription refill requests, have your pharmacy contact our office and allow 72 hours.    Cancer Center Support Programs:   > Cancer Support Group  2nd Tuesday of the month 1pm-2pm, Journey Room    

## 2019-03-02 NOTE — Progress Notes (Signed)
0850 Labs reviewed with and pt seen by Dr. Delton Coombes and pt approved for Taxol and Carboplatin infusion today per MD             Beverly Gust Egle tolerated chemo tx well without complaints or incident. VSS upon discharge. Pt discharged self ambulatory in satisfactory condition

## 2019-03-03 LAB — CA 125: Cancer Antigen (CA) 125: 7 U/mL (ref 0.0–38.1)

## 2019-03-04 ENCOUNTER — Encounter (HOSPITAL_COMMUNITY): Payer: Self-pay

## 2019-03-04 ENCOUNTER — Other Ambulatory Visit (HOSPITAL_COMMUNITY)
Admission: RE | Admit: 2019-03-04 | Discharge: 2019-03-04 | Disposition: A | Discharge status: Home / Self Care | Payer: Medicaid Other | Source: Ambulatory Visit | Attending: Physician Assistant | Admitting: Physician Assistant

## 2019-03-04 ENCOUNTER — Other Ambulatory Visit: Payer: Self-pay

## 2019-03-04 ENCOUNTER — Inpatient Hospital Stay (HOSPITAL_COMMUNITY): Payer: Medicaid Other

## 2019-03-04 VITALS — BP 137/77 | HR 59 | Temp 97.1°F | Resp 19 | Wt 214.1 lb

## 2019-03-04 DIAGNOSIS — E785 Hyperlipidemia, unspecified: Secondary | ICD-10-CM | POA: Insufficient documentation

## 2019-03-04 DIAGNOSIS — Z95828 Presence of other vascular implants and grafts: Secondary | ICD-10-CM

## 2019-03-04 DIAGNOSIS — R69 Illness, unspecified: Secondary | ICD-10-CM | POA: Diagnosis present

## 2019-03-04 DIAGNOSIS — Z5111 Encounter for antineoplastic chemotherapy: Secondary | ICD-10-CM | POA: Diagnosis not present

## 2019-03-04 DIAGNOSIS — C541 Malignant neoplasm of endometrium: Secondary | ICD-10-CM

## 2019-03-04 LAB — LIPID PANEL
Cholesterol: 202 mg/dL — ABNORMAL HIGH (ref 0–200)
HDL: 75 mg/dL (ref >40)
LDL Cholesterol: 115 mg/dL — ABNORMAL HIGH (ref 0–99)
Total CHOL/HDL Ratio: 2.7 RATIO
Triglycerides: 62 mg/dL (ref <150)
VLDL: 12 mg/dL (ref 0–40)

## 2019-03-04 MED ORDER — PEGFILGRASTIM-CBQV 6 MG/0.6ML ~~LOC~~ SOSY
6.0000 mg | PREFILLED_SYRINGE | Freq: Once | SUBCUTANEOUS | Status: AC
  Administered 2019-03-04: 09:00:00 6 mg via SUBCUTANEOUS
  Filled 2019-03-04: qty 0.6

## 2019-03-04 NOTE — Patient Instructions (Signed)
[facility] at [facility] Discharge Instructions  Received Udenyca injection today. Follow-up as scheduled. Call clinic for any questions or concerns   Thank you for choosing [facility] at [facility] to provide your oncology and hematology care.  To afford each patient quality time with our provider, please arrive at least 15 minutes before your scheduled appointment time.   If you have a lab appointment with the Cancer Center please come in thru the Main Entrance and check in at the main information desk.  You need to re-schedule your appointment should you arrive 10 or more minutes late.  We strive to give you quality time with our providers, and arriving late affects you and other patients whose appointments are after yours.  Also, if you no show three or more times for appointments you may be dismissed from the clinic at the providers discretion.     Again, thank you for choosing [facility].  Our hope is that these requests will decrease the amount of time that you wait before being seen by our physicians.       _____________________________________________________________  Should you have questions after your visit to [facility], please contact our office at [phone] between the hours of 8:00 a.m. and 4:30 p.m.  Voicemails left after 4:00 p.m. will not be returned until the following business day.  For prescription refill requests, have your pharmacy contact our office and allow 72 hours.    Due to Covid, you will need to wear a mask upon entering the hospital. If you do not have a mask, a mask will be given to you at the Main Entrance upon arrival. For doctor visits, patients may have 1 support person with them. For treatment visits, patients can not have anyone with them due to social distancing guidelines and our immunocompromised population.     

## 2019-03-04 NOTE — Progress Notes (Signed)
Patient tolerated Udenyca well.  Vitals signs were stable.  Patient did have any pain.  Discharged ambulatory in satisfactory condition.

## 2019-03-05 NOTE — Assessment & Plan Note (Signed)
1.  Recurrent endometrial carcinosarcoma: -3 cycles of carboplatin and paclitaxel from 12/29/2018 through 02/09/2019. -We reviewed CT CAP results from 02/28/2019.  Central pelvic mass has decreased in size measuring 2.6 x 2.6 cm. -CA-125 has come down to 7 from 127. -She complains of incomplete bladder evacuation occasionally. -I have reviewed her labs.  White count is 6.  Creatinine is 0.5.  Potassium is 3.7.  LFTs are within normal limits.  They were mildly elevated previously. -She will proceed with cycle 4 today.  We will see her back in 3 weeks for follow-up.  2.  Leg pains: -She has achy leg pains in the first week after each treatment.  She is taking Percocet 3 to 4 tablets/week. -She also has some tingling and numbness in the hands and feet for few days.  We will keep a close eye on it, as Taxol can contribute to this.  3.  Difficulty urinating: -This has improved tremendously.  Foley was discontinued after first cycle.  She sometimes feels like bladder is not evacuated completely.  4.  Nausea and vomiting: -She has occasional nausea in the first week which is well controlled with Compazine.

## 2019-03-09 ENCOUNTER — Other Ambulatory Visit: Payer: Self-pay | Admitting: Physician Assistant

## 2019-03-09 MED ORDER — ATORVASTATIN CALCIUM 20 MG PO TABS: 20.0000 mg | ORAL_TABLET | Freq: Every day | ORAL | 1 refills | Status: DC

## 2019-03-18 ENCOUNTER — Other Ambulatory Visit (HOSPITAL_COMMUNITY): Payer: Self-pay | Admitting: Nurse Practitioner

## 2019-03-18 ENCOUNTER — Other Ambulatory Visit (HOSPITAL_COMMUNITY): Payer: Self-pay | Admitting: *Deleted

## 2019-03-18 ENCOUNTER — Other Ambulatory Visit: Payer: Self-pay

## 2019-03-18 ENCOUNTER — Inpatient Hospital Stay (HOSPITAL_COMMUNITY): Payer: Medicaid Other

## 2019-03-18 DIAGNOSIS — C541 Malignant neoplasm of endometrium: Secondary | ICD-10-CM

## 2019-03-18 DIAGNOSIS — Z95828 Presence of other vascular implants and grafts: Secondary | ICD-10-CM

## 2019-03-18 DIAGNOSIS — R339 Retention of urine, unspecified: Secondary | ICD-10-CM

## 2019-03-18 DIAGNOSIS — Z5111 Encounter for antineoplastic chemotherapy: Secondary | ICD-10-CM | POA: Diagnosis not present

## 2019-03-18 LAB — URINALYSIS, ROUTINE W REFLEX MICROSCOPIC
Bilirubin Urine: NEGATIVE
Glucose, UA: NEGATIVE mg/dL
Ketones, ur: NEGATIVE mg/dL
Nitrite: NEGATIVE
Protein, ur: 100 mg/dL — AB
RBC / HPF: >50 RBC/hpf — ABNORMAL HIGH (ref 0–5)
Specific Gravity, Urine: 1.016 (ref 1.005–1.030)
WBC, UA: >50 WBC/hpf — ABNORMAL HIGH (ref 0–5)
pH: 8 (ref 5.0–8.0)

## 2019-03-18 LAB — COMPREHENSIVE METABOLIC PANEL
ALT: 35 U/L (ref 0–44)
AST: 25 U/L (ref 15–41)
Albumin: 4.3 g/dL (ref 3.5–5.0)
Alkaline Phosphatase: 151 U/L — ABNORMAL HIGH (ref 38–126)
Anion gap: 8 (ref 5–15)
BUN: 19 mg/dL (ref 6–20)
CO2: 25 mmol/L (ref 22–32)
Calcium: 9.4 mg/dL (ref 8.9–10.3)
Chloride: 107 mmol/L (ref 98–111)
Creatinine, Ser: 0.44 mg/dL (ref 0.44–1.00)
GFR calc Af Amer: >60 mL/min (ref >60)
GFR calc non Af Amer: >60 mL/min (ref >60)
Glucose, Bld: 103 mg/dL — ABNORMAL HIGH (ref 70–99)
Potassium: 4.2 mmol/L (ref 3.5–5.1)
Sodium: 140 mmol/L (ref 135–145)
Total Bilirubin: 0.4 mg/dL (ref 0.3–1.2)
Total Protein: 7.3 g/dL (ref 6.5–8.1)

## 2019-03-18 LAB — CBC WITH DIFFERENTIAL/PLATELET
Abs Immature Granulocytes: 0.05 10*3/uL (ref 0.00–0.07)
Basophils Absolute: 0 10*3/uL (ref 0.0–0.1)
Basophils Relative: 0 %
Eosinophils Absolute: 0.1 10*3/uL (ref 0.0–0.5)
Eosinophils Relative: 1 %
HCT: 34.8 % — ABNORMAL LOW (ref 36.0–46.0)
Hemoglobin: 11.4 g/dL — ABNORMAL LOW (ref 12.0–15.0)
Immature Granulocytes: 1 %
Lymphocytes Relative: 17 %
Lymphs Abs: 1.9 10*3/uL (ref 0.7–4.0)
MCH: 28.9 pg (ref 26.0–34.0)
MCHC: 32.8 g/dL (ref 30.0–36.0)
MCV: 88.1 fL (ref 80.0–100.0)
Monocytes Absolute: 0.7 10*3/uL (ref 0.1–1.0)
Monocytes Relative: 6 %
Neutro Abs: 8.3 10*3/uL — ABNORMAL HIGH (ref 1.7–7.7)
Neutrophils Relative %: 75 %
Platelets: 138 10*3/uL — ABNORMAL LOW (ref 150–400)
RBC: 3.95 MIL/uL (ref 3.87–5.11)
RDW: 22.1 % — ABNORMAL HIGH (ref 11.5–15.5)
WBC: 11 10*3/uL — ABNORMAL HIGH (ref 4.0–10.5)
nRBC: 0 % (ref 0.0–0.2)

## 2019-03-18 MED ORDER — CIPROFLOXACIN HCL 500 MG PO TABS: 500.0000 mg | ORAL_TABLET | Freq: Two times a day (BID) | ORAL | 0 refills | Status: DC

## 2019-03-18 MED ORDER — PHENAZOPYRIDINE HCL 100 MG PO TABS: 100.0000 mg | ORAL_TABLET | Freq: Two times a day (BID) | ORAL | 0 refills | Status: DC | PRN

## 2019-03-18 NOTE — Telephone Encounter (Signed)
Patient was notified of her urinalysis.  She was advised of the directions on the cipro and pyridium per Francene Finders, NP.  She verbalizes understanding. She will come in on Monday as scheduled.

## 2019-03-18 NOTE — Progress Notes (Signed)
Patient called clinic reporting some vaginal or urinary bleeding. She has trouble urinating, feels the urge but only goes a little bit.  She reports that she then feels like she still sensation like she has to go.   I have asked patient to come in today for labs and urinalysis and culture.  She will see the provider on Monday.  Provider will call her in some antibiotics if its is an infection.  She verbalizes understanding.

## 2019-03-21 ENCOUNTER — Encounter (HOSPITAL_COMMUNITY): Payer: Self-pay | Admitting: Hematology

## 2019-03-21 ENCOUNTER — Other Ambulatory Visit: Payer: Self-pay

## 2019-03-21 ENCOUNTER — Inpatient Hospital Stay (HOSPITAL_COMMUNITY): Payer: Medicaid Other | Attending: Hematology | Admitting: Hematology

## 2019-03-21 ENCOUNTER — Other Ambulatory Visit (HOSPITAL_COMMUNITY): Payer: Self-pay

## 2019-03-21 DIAGNOSIS — Z8249 Family history of ischemic heart disease and other diseases of the circulatory system: Secondary | ICD-10-CM | POA: Diagnosis not present

## 2019-03-21 DIAGNOSIS — R110 Nausea: Secondary | ICD-10-CM | POA: Diagnosis not present

## 2019-03-21 DIAGNOSIS — B9620 Unspecified Escherichia coli [E. coli] as the cause of diseases classified elsewhere: Secondary | ICD-10-CM | POA: Diagnosis not present

## 2019-03-21 DIAGNOSIS — R319 Hematuria, unspecified: Secondary | ICD-10-CM | POA: Diagnosis not present

## 2019-03-21 DIAGNOSIS — Z9079 Acquired absence of other genital organ(s): Secondary | ICD-10-CM | POA: Diagnosis not present

## 2019-03-21 DIAGNOSIS — Z90710 Acquired absence of both cervix and uterus: Secondary | ICD-10-CM | POA: Diagnosis not present

## 2019-03-21 DIAGNOSIS — Z801 Family history of malignant neoplasm of trachea, bronchus and lung: Secondary | ICD-10-CM | POA: Diagnosis not present

## 2019-03-21 DIAGNOSIS — Z5189 Encounter for other specified aftercare: Secondary | ICD-10-CM | POA: Insufficient documentation

## 2019-03-21 DIAGNOSIS — Z79899 Other long term (current) drug therapy: Secondary | ICD-10-CM | POA: Diagnosis not present

## 2019-03-21 DIAGNOSIS — M79606 Pain in leg, unspecified: Secondary | ICD-10-CM | POA: Insufficient documentation

## 2019-03-21 DIAGNOSIS — Z90722 Acquired absence of ovaries, bilateral: Secondary | ICD-10-CM | POA: Diagnosis not present

## 2019-03-21 DIAGNOSIS — Z5111 Encounter for antineoplastic chemotherapy: Secondary | ICD-10-CM | POA: Diagnosis not present

## 2019-03-21 DIAGNOSIS — Z791 Long term (current) use of non-steroidal anti-inflammatories (NSAID): Secondary | ICD-10-CM | POA: Diagnosis not present

## 2019-03-21 DIAGNOSIS — C541 Malignant neoplasm of endometrium: Secondary | ICD-10-CM | POA: Diagnosis not present

## 2019-03-21 DIAGNOSIS — K5900 Constipation, unspecified: Secondary | ICD-10-CM | POA: Insufficient documentation

## 2019-03-21 DIAGNOSIS — N390 Urinary tract infection, site not specified: Secondary | ICD-10-CM | POA: Insufficient documentation

## 2019-03-21 LAB — URINE CULTURE: Culture: >=100000 — AB

## 2019-03-21 NOTE — Assessment & Plan Note (Signed)
1.  Recurrent endometrial carcinosarcoma: -4 cycles of carboplatin and paclitaxel from 12/29/2018 through 03/02/2019. -CT from 02/28/2019 showed central pelvic mass decreased in size measuring 2.6 x 2.6 cm. -Last CA-125 was 7 on 03/02/2019. -She will come back Wednesday for her next cycle.  2.  Urinary sepsis: -She had UTI with hematuria.  She also had right line pain. -UA was positive for bacteria.  Urine culture reviewed by me shows E. coli more than 100,000 colonies. -She is continuing Cipro 500 twice daily.  Her hematuria has improved.  She no longer has dysuria.  She still has some right line pain with tenderness. -She reports that she had some on and off right line pain for the past few weeks. -I have reviewed her CT scan images which did not show any abnormality around the right kidney.  3.  Leg pains: -She has achy leg pains in the first week after each treatment.  She takes Percocet 3 to 4 tablets/week. -She requests refill for Percocet.  She also has some tingling and numbness in the hands and feet for few days after each treatment.  4.  Difficulty urinating: -This has improved tremendously after chemo started.  Foley was discontinued after first cycle. -She still sometimes has a sensation of incomplete evacuation.  5.  Nausea/vomiting: -She has occasional nausea in the first week after each treatment which is controlled well with Compazine.

## 2019-03-21 NOTE — Progress Notes (Signed)
[place] [place], [place] [address]   CLINIC:  Medical Oncology/Hematology  PCP:  Soyla Dryer, PA-C [place] [address] [phone]   REASON FOR VISIT:  Follow-up for recurrent endometrial carcinosarcoma.  CURRENT THERAPY: Carboplatin and paclitaxel every 3 weeks.  BRIEF ONCOLOGIC HISTORY:  Oncology History  Endometrial cancer ([place])  03/31/2018 Initial Diagnosis   Endometrial cancer ([place])   12/29/2018 -  Chemotherapy   The patient had palonosetron (ALOXI) injection 0.25 mg, 0.25 mg, Intravenous,  Once, 4 of 6 cycles Administration: 0.25 mg (12/29/2018), 0.25 mg (01/19/2019), 0.25 mg (02/09/2019), 0.25 mg (03/02/2019) pegfilgrastim-cbqv (UDENYCA) injection 6 mg, 6 mg, Subcutaneous, Once, 4 of 6 cycles Administration: 6 mg (12/31/2018), 6 mg (01/20/2019), 6 mg (02/11/2019) CARBOplatin (PARAPLATIN) 860 mg in sodium chloride 0.9 % 500 mL chemo infusion, 860 mg (100 % of original dose 862.8 mg), Intravenous,  Once, 4 of 6 cycles Dose modification:   (original dose 862.8 mg, Cycle 1),   (original dose 862.8 mg, Cycle 2),   (original dose 862.8 mg, Cycle 3),   (original dose 862.8 mg, Cycle 4) Administration: 860 mg (12/29/2018), 860 mg (01/19/2019), 860 mg (02/09/2019), 860 mg (03/02/2019) fosaprepitant (EMEND) 150 mg in sodium chloride 0.9 % 145 mL IVPB, 150 mg, Intravenous,  Once, 4 of 6 cycles Administration: 150 mg (01/19/2019), 150 mg (02/09/2019), 150 mg (03/02/2019) PACLitaxel (TAXOL) 360 mg in sodium chloride 0.9 % 500 mL chemo infusion (> 80mg /m2), 175 mg/m2 = 360 mg, Intravenous,  Once, 4 of 6 cycles Administration: 360 mg (12/29/2018), 360 mg (01/19/2019), 360 mg (02/09/2019), 360 mg (03/02/2019)  for chemotherapy treatment.       CANCER STAGING: Cancer Staging No matching staging information was found for the patient.   INTERVAL HISTORY:  Ms. Ezekiel 59 y.o. female seen for follow-up of endometrial adenocarcinoma and  follow-up of urinary infection.  She was evaluated on Friday with a UA and culture.  She was started on Cipro 500 twice daily for hematuria.  She reports that hematuria has stopped completely.  She did not have to use Pyridium.  Denies any fevers or chills.  She continues to have on and off pain in the right lung region.  She is taking Percocet as needed.  REVIEW OF SYSTEMS:  Review of Systems  Genitourinary: Positive for difficulty urinating.   Psychiatric/Behavioral: Positive for sleep disturbance. The patient is nervous/anxious.   All other systems reviewed and are negative.    PAST MEDICAL/SURGICAL HISTORY:  Past Medical History:  Diagnosis Date  . Elevated blood pressure reading   . Endometrial cancer ([place])   . PMB (postmenopausal bleeding)   . Port-A-Cath in place 12/24/2018   Past Surgical History:  Procedure Laterality Date  . IR IMAGING GUIDED PORT INSERTION  12/28/2018  . IR RADIOLOGIST EVAL & MGMT  06/02/2018  . IR RADIOLOGIST EVAL & MGMT  06/16/2018  . NECK SURGERY  2000   spinal surgery , titanium rod in place   . ROBOTIC ASSISTED TOTAL HYSTERECTOMY WITH BILATERAL SALPINGO OOPHERECTOMY N/A 04/13/2018   Procedure: XI ROBOTIC ASSISTED TOTAL HYSTERECTOMY WITH BILATERAL SALPINGO OOPHORECTOMY;  Surgeon: Everitt Amber, MD;  Location: [facility];  Service: Gynecology;  Laterality: N/A;  . ROBOTIC PELVIC AND PARA-AORTIC LYMPH NODE DISSECTION N/A 04/13/2018   Procedure: XI ROBOTIC PELVIC LYMPHADECTOMY AND PARA-AORTIC LYMPH NODE DISSECTION;  Surgeon: Everitt Amber, MD;  Location: [facility];  Service: Gynecology;  Laterality: N/A;     SOCIAL HISTORY:  Social History  Socioeconomic History  . Marital status: Widowed    Spouse name: Not on file  . Number of children: Not on file  . Years of education: Not on file  . Highest education level: Not on file  Occupational History  . Not on file  Tobacco Use  . Smoking status: Never Smoker  . Smokeless tobacco: Never Used  Substance and Sexual  Activity  . Alcohol use: Yes    Alcohol/week: 4.0 standard drinks    Types: 4 Glasses of wine per week    Comment: weekends  . Drug use: Never  . Sexual activity: Not Currently  Other Topics Concern  . Not on file  Social History Narrative  . Not on file   Social Determinants of Health   Financial Resource Strain:   . Difficulty of Paying Living Expenses: Not on file  Food Insecurity: No Food Insecurity  . Worried About Charity fundraiser in the Last Year: Never true  . Ran Out of Food in the Last Year: Never true  Transportation Needs: No Transportation Needs  . Lack of Transportation (Medical): No  . Lack of Transportation (Non-Medical): No  Physical Activity:   . Days of Exercise per Week: Not on file  . Minutes of Exercise per Session: Not on file  Stress:   . Feeling of Stress : Not on file  Social Connections:   . Frequency of Communication with Friends and Family: Not on file  . Frequency of Social Gatherings with Friends and Family: Not on file  . Attends Religious Services: Not on file  . Active Member of Clubs or Organizations: Not on file  . Attends Archivist Meetings: Not on file  . Marital Status: Not on file  Intimate Partner Violence:   . Fear of Current or Ex-Partner: Not on file  . Emotionally Abused: Not on file  . Physically Abused: Not on file  . Sexually Abused: Not on file    FAMILY HISTORY:  Family History  Problem Relation Age of Onset  . Hypertension Father   . Heart disease Father   . Cancer Father        lung  . Hypertension Mother     CURRENT MEDICATIONS:  Outpatient Encounter Medications as of 03/21/2019  Medication Sig  . amLODipine (NORVASC) 5 MG tablet Take 1 tablet (5 mg total) by mouth daily.  Marland Kitchen atorvastatin (LIPITOR) 20 MG tablet Take 1 tablet (20 mg total) by mouth daily.  Marland Kitchen CARBOPLATIN IV Inject into the vein every 21 ( twenty-one) days.  . ciprofloxacin (CIPRO) 500 MG tablet Take 1 tablet (500 mg total) by mouth  2 (two) times daily.  Marland Kitchen PACLITAXEL IV Inject into the vein every 21 ( twenty-one) days.  Marland Kitchen dicyclomine (BENTYL) 20 MG tablet Take 1 tablet (20 mg total) by mouth every 6 (six) hours. (Patient not taking: Reported on 03/21/2019)  . ibuprofen (ADVIL) 200 MG tablet Take 200 mg by mouth every 6 (six) hours as needed.  . lidocaine-prilocaine (EMLA) cream Apply small amount to port a cath site and cover with plastic wrap one hour prior to chemotherapy appointments (Patient not taking: Reported on 03/21/2019)  . oxyCODONE-acetaminophen (PERCOCET) 5-325 MG tablet Take 1 tablet by mouth every 8 (eight) hours as needed (pain). (Patient not taking: Reported on 03/21/2019)  . phenazopyridine (PYRIDIUM) 100 MG tablet Take 1 tablet (100 mg total) by mouth 2 (two) times daily as needed for pain. (Patient not taking: Reported on 03/21/2019)  . polyethylene  glycol (MIRALAX / GLYCOLAX) 17 g packet Take 17 g by mouth 2 (two) times daily as needed.  . prochlorperazine (COMPAZINE) 10 MG tablet Take 1 tablet (10 mg total) by mouth every 6 (six) hours as needed (Nausea or vomiting). (Patient not taking: Reported on 03/21/2019)   No facility-administered encounter medications on file as of 03/21/2019.    ALLERGIES:  No Known Allergies   PHYSICAL EXAM:  ECOG Performance status: 1  Vitals:   03/21/19 1610  BP: (!) 141/59  Pulse: 76  Resp: 20  Temp: (!) 97.1 F (36.2 C)  SpO2: 97%   Filed Weights   03/21/19 1610  Weight: 215 lb 12.8 oz (97.9 kg)    Physical Exam Vitals reviewed.  Constitutional:      Appearance: Normal appearance.  Cardiovascular:     Rate and Rhythm: Normal rate and regular rhythm.  Pulmonary:     Effort: Pulmonary effort is normal.     Breath sounds: Normal breath sounds.  Abdominal:     General: There is distension.     Palpations: Abdomen is soft. There is mass.  Skin:    General: Skin is warm.  Neurological:     General: No focal deficit present.     Mental Status: She is alert and  oriented to person, place, and time.  Psychiatric:        Mood and Affect: Mood normal.        Behavior: Behavior normal.      LABORATORY DATA:  I have reviewed the labs as listed.  CBC    Component Value Date/Time   WBC 11.0 (H) 03/18/2019 1355   RBC 3.95 03/18/2019 1355   HGB 11.4 (L) 03/18/2019 1355   HCT 34.8 (L) 03/18/2019 1355   PLT 138 (L) 03/18/2019 1355   MCV 88.1 03/18/2019 1355   MCH 28.9 03/18/2019 1355   MCHC 32.8 03/18/2019 1355   RDW 22.1 (H) 03/18/2019 1355   LYMPHSABS 1.9 03/18/2019 1355   MONOABS 0.7 03/18/2019 1355   EOSABS 0.1 03/18/2019 1355   BASOSABS 0.0 03/18/2019 1355   CMP Latest Ref Rng & Units 03/18/2019 03/02/2019 02/09/2019  Glucose 70 - 99 mg/dL 103(H) 142(H) 99  BUN 6 - 20 mg/dL 19 14 23(H)  Creatinine 0.44 - 1.00 mg/dL 0.44 0.52 0.50  Sodium 135 - 145 mmol/L 140 139 140  Potassium 3.5 - 5.1 mmol/L 4.2 3.7 3.9  Chloride 98 - 111 mmol/L 107 106 105  CO2 22 - 32 mmol/L 25 26 23   Calcium 8.9 - 10.3 mg/dL 9.4 9.0 8.9  Total Protein 6.5 - 8.1 g/dL 7.3 6.8 6.9  Total Bilirubin 0.3 - 1.2 mg/dL 0.4 0.4 0.3  Alkaline Phos 38 - 126 U/L 151(H) 124 141(H)  AST 15 - 41 U/L 25 20 33  ALT 0 - 44 U/L 35 28 51(H)       DIAGNOSTIC IMAGING:  I have independently reviewed the scans and discussed with the patient.   ASSESSMENT & PLAN:   Endometrial cancer ([facility]) 1.  Recurrent endometrial carcinosarcoma: -4 cycles of carboplatin and paclitaxel from 12/29/2018 through 03/02/2019. -CT from 02/28/2019 showed central pelvic mass decreased in size measuring 2.6 x 2.6 cm. -Last CA-125 was 7 on 03/02/2019. -She will come back Wednesday for her next cycle.  2.  Urinary sepsis: -She had UTI with hematuria.  She also had right line pain. -UA was positive for bacteria.  Urine culture reviewed by me shows E. coli more than 100,000 colonies. -She is  continuing Cipro 500 twice daily.  Her hematuria has improved.  She no longer has dysuria.  She still has some right  line pain with tenderness. -She reports that she had some on and off right line pain for the past few weeks. -I have reviewed her CT scan images which did not show any abnormality around the right kidney.  3.  Leg pains: -She has achy leg pains in the first week after each treatment.  She takes Percocet 3 to 4 tablets/week. -She requests refill for Percocet.  She also has some tingling and numbness in the hands and feet for few days after each treatment.  4.  Difficulty urinating: -This has improved tremendously after chemo started.  Foley was discontinued after first cycle. -She still sometimes has a sensation of incomplete evacuation.  5.  Nausea/vomiting: -She has occasional nausea in the first week after each treatment which is controlled well with Compazine.      Orders placed this encounter:  No orders of the defined types were placed in this encounter.     [name], MD [place] [phone]

## 2019-03-21 NOTE — Patient Instructions (Addendum)
[facility] at [facility] Discharge Instructions  You were seen today by Dr. Katragadda. He went over your recent lab results. He will see you back as scheduled for labs, treatment and follow up.   Thank you for choosing [facility] at [facility] to provide your oncology and hematology care.  To afford each patient quality time with our provider, please arrive at least 15 minutes before your scheduled appointment time.   If you have a lab appointment with the Cancer Center please come in thru the  Main Entrance and check in at the main information desk  You need to re-schedule your appointment should you arrive 10 or more minutes late.  We strive to give you quality time with our providers, and arriving late affects you and other patients whose appointments are after yours.  Also, if you no show three or more times for appointments you may be dismissed from the clinic at the providers discretion.     Again, thank you for choosing [facility].  Our hope is that these requests will decrease the amount of time that you wait before being seen by our physicians.       _____________________________________________________________  Should you have questions after your visit to [facility], please contact our office at [phone] between the hours of 8:00 a.m. and 4:30 p.m.  Voicemails left after 4:00 p.m. will not be returned until the following business day.  For prescription refill requests, have your pharmacy contact our office and allow 72 hours.    Cancer Center Support Programs:   > Cancer Support Group  2nd Tuesday of the month 1pm-2pm, Journey Room    

## 2019-03-22 MED ORDER — OXYCODONE-ACETAMINOPHEN 5-325 MG PO TABS: 1.0000 | ORAL_TABLET | Freq: Three times a day (TID) | ORAL | 0 refills | Status: DC | PRN

## 2019-03-22 NOTE — Addendum Note (Signed)
Addended by: [name] on: 03/22/2019 07:54 AM   Modules accepted: Orders

## 2019-03-23 ENCOUNTER — Other Ambulatory Visit: Payer: Self-pay

## 2019-03-23 ENCOUNTER — Encounter (HOSPITAL_COMMUNITY): Payer: Self-pay | Admitting: Hematology

## 2019-03-23 ENCOUNTER — Inpatient Hospital Stay (HOSPITAL_COMMUNITY): Payer: Medicaid Other

## 2019-03-23 ENCOUNTER — Other Ambulatory Visit (HOSPITAL_COMMUNITY): Payer: Self-pay

## 2019-03-23 ENCOUNTER — Inpatient Hospital Stay (HOSPITAL_BASED_OUTPATIENT_CLINIC_OR_DEPARTMENT_OTHER): Payer: Medicaid Other | Admitting: Hematology

## 2019-03-23 VITALS — BP 129/65 | HR 72 | Temp 98.1°F | Resp 18

## 2019-03-23 DIAGNOSIS — C541 Malignant neoplasm of endometrium: Secondary | ICD-10-CM

## 2019-03-23 DIAGNOSIS — Z95828 Presence of other vascular implants and grafts: Secondary | ICD-10-CM

## 2019-03-23 DIAGNOSIS — Z5111 Encounter for antineoplastic chemotherapy: Secondary | ICD-10-CM | POA: Diagnosis not present

## 2019-03-23 LAB — CBC WITH DIFFERENTIAL/PLATELET
Abs Immature Granulocytes: 0.01 10*3/uL (ref 0.00–0.07)
Basophils Absolute: 0.1 10*3/uL (ref 0.0–0.1)
Basophils Relative: 1 %
Eosinophils Absolute: 0 10*3/uL (ref 0.0–0.5)
Eosinophils Relative: 1 %
HCT: 32 % — ABNORMAL LOW (ref 36.0–46.0)
Hemoglobin: 10.4 g/dL — ABNORMAL LOW (ref 12.0–15.0)
Immature Granulocytes: 0 %
Lymphocytes Relative: 32 %
Lymphs Abs: 1.8 10*3/uL (ref 0.7–4.0)
MCH: 29.2 pg (ref 26.0–34.0)
MCHC: 32.5 g/dL (ref 30.0–36.0)
MCV: 89.9 fL (ref 80.0–100.0)
Monocytes Absolute: 0.6 10*3/uL (ref 0.1–1.0)
Monocytes Relative: 11 %
Neutro Abs: 3.3 10*3/uL (ref 1.7–7.7)
Neutrophils Relative %: 55 %
Platelets: 216 10*3/uL (ref 150–400)
RBC: 3.56 MIL/uL — ABNORMAL LOW (ref 3.87–5.11)
RDW: 22.5 % — ABNORMAL HIGH (ref 11.5–15.5)
WBC: 5.9 10*3/uL (ref 4.0–10.5)
nRBC: 0 % (ref 0.0–0.2)

## 2019-03-23 LAB — COMPREHENSIVE METABOLIC PANEL
ALT: 52 U/L — ABNORMAL HIGH (ref 0–44)
AST: 38 U/L (ref 15–41)
Albumin: 3.8 g/dL (ref 3.5–5.0)
Alkaline Phosphatase: 131 U/L — ABNORMAL HIGH (ref 38–126)
Anion gap: 7 (ref 5–15)
BUN: 23 mg/dL — ABNORMAL HIGH (ref 6–20)
CO2: 26 mmol/L (ref 22–32)
Calcium: 8.9 mg/dL (ref 8.9–10.3)
Chloride: 106 mmol/L (ref 98–111)
Creatinine, Ser: 0.44 mg/dL (ref 0.44–1.00)
GFR calc Af Amer: >60 mL/min (ref >60)
GFR calc non Af Amer: >60 mL/min (ref >60)
Glucose, Bld: 98 mg/dL (ref 70–99)
Potassium: 3.8 mmol/L (ref 3.5–5.1)
Sodium: 139 mmol/L (ref 135–145)
Total Bilirubin: 0.3 mg/dL (ref 0.3–1.2)
Total Protein: 6.8 g/dL (ref 6.5–8.1)

## 2019-03-23 MED ORDER — SODIUM CHLORIDE 0.9 % IV SOLN
150.0000 mg | Freq: Once | INTRAVENOUS | Status: AC
  Administered 2019-03-23: 150 mg via INTRAVENOUS
  Filled 2019-03-23: qty 150

## 2019-03-23 MED ORDER — SODIUM CHLORIDE 0.9 % IV SOLN
10.0000 mg | Freq: Once | INTRAVENOUS | Status: AC
  Administered 2019-03-23: 10 mg via INTRAVENOUS
  Filled 2019-03-23: qty 10

## 2019-03-23 MED ORDER — DIPHENHYDRAMINE HCL 50 MG/ML IJ SOLN
50.0000 mg | Freq: Once | INTRAMUSCULAR | Status: AC
  Administered 2019-03-23: 50 mg via INTRAVENOUS
  Filled 2019-03-23: qty 1

## 2019-03-23 MED ORDER — SODIUM CHLORIDE 0.9 % IV SOLN
175.0000 mg/m2 | Freq: Once | INTRAVENOUS | Status: AC
  Administered 2019-03-23: 360 mg via INTRAVENOUS
  Filled 2019-03-23: qty 60

## 2019-03-23 MED ORDER — SODIUM CHLORIDE 0.9 % IV SOLN
862.8000 mg | Freq: Once | INTRAVENOUS | Status: AC
  Administered 2019-03-23: 860 mg via INTRAVENOUS
  Filled 2019-03-23: qty 86

## 2019-03-23 MED ORDER — SODIUM CHLORIDE 0.9 % IV SOLN: Freq: Once | INTRAVENOUS | Status: AC

## 2019-03-23 MED ORDER — HEPARIN SOD (PORK) LOCK FLUSH 100 UNIT/ML IV SOLN
500.0000 [IU] | Freq: Once | INTRAVENOUS | Status: AC | PRN
  Administered 2019-03-23: 500 [IU]

## 2019-03-23 MED ORDER — PALONOSETRON HCL INJECTION 0.25 MG/5ML
0.2500 mg | Freq: Once | INTRAVENOUS | Status: AC
  Administered 2019-03-23: 0.25 mg via INTRAVENOUS
  Filled 2019-03-23: qty 5

## 2019-03-23 MED ORDER — FAMOTIDINE IN NACL 20-0.9 MG/50ML-% IV SOLN
20.0000 mg | Freq: Once | INTRAVENOUS | Status: AC
  Administered 2019-03-23: 20 mg via INTRAVENOUS
  Filled 2019-03-23: qty 50

## 2019-03-23 MED ORDER — SODIUM CHLORIDE 0.9% FLUSH
10.0000 mL | INTRAVENOUS | Status: DC | PRN
  Administered 2019-03-23: 10 mL

## 2019-03-23 NOTE — Progress Notes (Signed)
[place], [place] [address]   CLINIC:  Medical Oncology/Hematology  PCP:  Soyla Dryer, PA-C [place] [address] [phone]   REASON FOR VISIT:  Follow-up for recurrent endometrial carcinosarcoma.  CURRENT THERAPY: Carboplatin and paclitaxel every 3 weeks.  BRIEF ONCOLOGIC HISTORY:  Oncology History  Endometrial cancer ([place])  03/31/2018 Initial Diagnosis   Endometrial cancer ([place])   12/29/2018 -  Chemotherapy   The patient had palonosetron (ALOXI) injection 0.25 mg, 0.25 mg, Intravenous,  Once, 5 of 6 cycles Administration: 0.25 mg (12/29/2018), 0.25 mg (01/19/2019), 0.25 mg (02/09/2019), 0.25 mg (03/02/2019), 0.25 mg (03/23/2019) pegfilgrastim-cbqv (UDENYCA) injection 6 mg, 6 mg, Subcutaneous, Once, 5 of 6 cycles Administration: 6 mg (12/31/2018), 6 mg (01/20/2019), 6 mg (02/11/2019), 6 mg (03/04/2019) CARBOplatin (PARAPLATIN) 860 mg in sodium chloride 0.9 % 500 mL chemo infusion, 860 mg (100 % of original dose 862.8 mg), Intravenous,  Once, 5 of 6 cycles Dose modification:   (original dose 862.8 mg, Cycle 1),   (original dose 862.8 mg, Cycle 2),   (original dose 862.8 mg, Cycle 3),   (original dose 862.8 mg, Cycle 4),   (original dose 862.8 mg, Cycle 5) Administration: 860 mg (12/29/2018), 860 mg (01/19/2019), 860 mg (02/09/2019), 860 mg (03/02/2019), 860 mg (03/23/2019) fosaprepitant (EMEND) 150 mg in sodium chloride 0.9 % 145 mL IVPB, 150 mg, Intravenous,  Once, 5 of 6 cycles Administration: 150 mg (01/19/2019), 150 mg (02/09/2019), 150 mg (03/02/2019), 150 mg (03/23/2019) PACLitaxel (TAXOL) 360 mg in sodium chloride 0.9 % 500 mL chemo infusion (> 80mg /m2), 175 mg/m2 = 360 mg, Intravenous,  Once, 5 of 6 cycles Administration: 360 mg (12/29/2018), 360 mg (01/19/2019), 360 mg (02/09/2019), 360 mg (03/02/2019), 360 mg (03/23/2019)  for chemotherapy treatment.       CANCER STAGING: Cancer Staging No matching staging information was  found for the patient.   INTERVAL HISTORY:  Ms. Engelsman 59 y.o. female seen for follow-up of endometrial adenocarcinoma and toxicity assessment prior to cycle 5 of chemotherapy.  Her urinary symptoms have 80% resolved.  However she still has some pain in the right lateral abdominal wall with tenderness.  Appetite is 100%.  Energy levels are 75%.  Numbness in the feet has been stable.  Still complains of some incomplete evacuation at times.  REVIEW OF SYSTEMS:  Review of Systems  Genitourinary: Positive for difficulty urinating.   Neurological: Positive for numbness.  Psychiatric/Behavioral: Positive for sleep disturbance. The patient is nervous/anxious.   All other systems reviewed and are negative.    PAST MEDICAL/SURGICAL HISTORY:  Past Medical History:  Diagnosis Date  . Elevated blood pressure reading   . Endometrial cancer ([place])   . PMB (postmenopausal bleeding)   . Port-A-Cath in place 12/24/2018   Past Surgical History:  Procedure Laterality Date  . IR IMAGING GUIDED PORT INSERTION  12/28/2018  . IR RADIOLOGIST EVAL & MGMT  06/02/2018  . IR RADIOLOGIST EVAL & MGMT  06/16/2018  . NECK SURGERY  2000   spinal surgery , titanium rod in place   . ROBOTIC ASSISTED TOTAL HYSTERECTOMY WITH BILATERAL SALPINGO OOPHERECTOMY N/A 04/13/2018   Procedure: XI ROBOTIC ASSISTED TOTAL HYSTERECTOMY WITH BILATERAL SALPINGO OOPHORECTOMY;  Surgeon: Everitt Amber, MD;  Location: [facility];  Service: Gynecology;  Laterality: N/A;  . ROBOTIC PELVIC AND PARA-AORTIC LYMPH NODE DISSECTION N/A 04/13/2018   Procedure: XI ROBOTIC PELVIC LYMPHADECTOMY AND PARA-AORTIC LYMPH NODE DISSECTION;  Surgeon: Everitt Amber, MD;  Location: [facility];  Service: Gynecology;  Laterality: N/A;     SOCIAL HISTORY:  Social History   Socioeconomic History  . Marital status: Widowed    Spouse name: Not on file  . Number of children: Not on file  . Years of education: Not on file  . Highest education level: Not on file    Occupational History  . Not on file  Tobacco Use  . Smoking status: Never Smoker  . Smokeless tobacco: Never Used  Substance and Sexual Activity  . Alcohol use: Yes    Alcohol/week: 4.0 standard drinks    Types: 4 Glasses of wine per week    Comment: weekends  . Drug use: Never  . Sexual activity: Not Currently  Other Topics Concern  . Not on file  Social History Narrative  . Not on file   Social Determinants of Health   Financial Resource Strain:   . Difficulty of Paying Living Expenses: Not on file  Food Insecurity: No Food Insecurity  . Worried About Charity fundraiser in the Last Year: Never true  . Ran Out of Food in the Last Year: Never true  Transportation Needs: No Transportation Needs  . Lack of Transportation (Medical): No  . Lack of Transportation (Non-Medical): No  Physical Activity:   . Days of Exercise per Week: Not on file  . Minutes of Exercise per Session: Not on file  Stress:   . Feeling of Stress : Not on file  Social Connections:   . Frequency of Communication with Friends and Family: Not on file  . Frequency of Social Gatherings with Friends and Family: Not on file  . Attends Religious Services: Not on file  . Active Member of Clubs or Organizations: Not on file  . Attends Archivist Meetings: Not on file  . Marital Status: Not on file  Intimate Partner Violence:   . Fear of Current or Ex-Partner: Not on file  . Emotionally Abused: Not on file  . Physically Abused: Not on file  . Sexually Abused: Not on file    FAMILY HISTORY:  Family History  Problem Relation Age of Onset  . Hypertension Father   . Heart disease Father   . Cancer Father        lung  . Hypertension Mother     CURRENT MEDICATIONS:  Outpatient Encounter Medications as of 03/23/2019  Medication Sig  . amLODipine (NORVASC) 5 MG tablet Take 1 tablet (5 mg total) by mouth daily. (Patient not taking: Reported on 03/25/2019)  . atorvastatin (LIPITOR) 20 MG tablet Take  1 tablet (20 mg total) by mouth daily. (Patient not taking: Reported on 03/25/2019)  . CARBOPLATIN IV Inject into the vein every 21 ( twenty-one) days.  . ciprofloxacin (CIPRO) 500 MG tablet Take 1 tablet (500 mg total) by mouth 2 (two) times daily. (Patient not taking: Reported on 03/25/2019)  . PACLITAXEL IV Inject into the vein every 21 ( twenty-one) days.  Marland Kitchen dicyclomine (BENTYL) 20 MG tablet Take 1 tablet (20 mg total) by mouth every 6 (six) hours. (Patient not taking: Reported on 03/21/2019)  . ibuprofen (ADVIL) 200 MG tablet Take 200 mg by mouth every 6 (six) hours as needed.  . lidocaine-prilocaine (EMLA) cream Apply small amount to port a cath site and cover with plastic wrap one hour prior to chemotherapy appointments (Patient not taking: Reported on 03/21/2019)  . oxyCODONE-acetaminophen (PERCOCET) 5-325 MG tablet Take 1 tablet by mouth every 8 (eight) hours as needed (pain). (Patient not taking:  Reported on 03/23/2019)  . phenazopyridine (PYRIDIUM) 100 MG tablet Take 1 tablet (100 mg total) by mouth 2 (two) times daily as needed for pain. (Patient not taking: Reported on 03/21/2019)  . polyethylene glycol (MIRALAX / GLYCOLAX) 17 g packet Take 17 g by mouth 2 (two) times daily as needed.  . prochlorperazine (COMPAZINE) 10 MG tablet Take 1 tablet (10 mg total) by mouth every 6 (six) hours as needed (Nausea or vomiting). (Patient not taking: Reported on 03/21/2019)   No facility-administered encounter medications on file as of 03/23/2019.    ALLERGIES:  No Known Allergies   PHYSICAL EXAM:  ECOG Performance status: 1  Vitals:   03/23/19 0822  BP: 137/65  Pulse: 72  Resp: 18  Temp: (!) 97.1 F (36.2 C)  SpO2: 97%   Filed Weights   03/23/19 0822  Weight: 218 lb 14.4 oz (99.3 kg)    Physical Exam Vitals reviewed.  Constitutional:      Appearance: Normal appearance.  Cardiovascular:     Rate and Rhythm: Normal rate and regular rhythm.     Heart sounds: Normal heart sounds.  Pulmonary:      Effort: Pulmonary effort is normal.     Breath sounds: Normal breath sounds.  Abdominal:     General: There is no distension.     Palpations: Abdomen is soft. There is no mass.  Skin:    General: Skin is warm.  Neurological:     General: No focal deficit present.     Mental Status: She is alert and oriented to person, place, and time.  Psychiatric:        Mood and Affect: Mood normal.        Behavior: Behavior normal.    Mild tenderness in the right lateral abdominal wall.  LABORATORY DATA:  I have reviewed the labs as listed.  CBC    Component Value Date/Time   WBC 5.9 03/23/2019 0933   RBC 3.56 (L) 03/23/2019 0933   HGB 10.4 (L) 03/23/2019 0933   HCT 32.0 (L) 03/23/2019 0933   PLT 216 03/23/2019 0933   MCV 89.9 03/23/2019 0933   MCH 29.2 03/23/2019 0933   MCHC 32.5 03/23/2019 0933   RDW 22.5 (H) 03/23/2019 0933   LYMPHSABS 1.8 03/23/2019 0933   MONOABS 0.6 03/23/2019 0933   EOSABS 0.0 03/23/2019 0933   BASOSABS 0.1 03/23/2019 0933   CMP Latest Ref Rng & Units 03/23/2019 03/18/2019 03/02/2019  Glucose 70 - 99 mg/dL 98 103(H) 142(H)  BUN 6 - 20 mg/dL 23(H) 19 14  Creatinine 0.44 - 1.00 mg/dL 0.44 0.44 0.52  Sodium 135 - 145 mmol/L 139 140 139  Potassium 3.5 - 5.1 mmol/L 3.8 4.2 3.7  Chloride 98 - 111 mmol/L 106 107 106  CO2 22 - 32 mmol/L 26 25 26   Calcium 8.9 - 10.3 mg/dL 8.9 9.4 9.0  Total Protein 6.5 - 8.1 g/dL 6.8 7.3 6.8  Total Bilirubin 0.3 - 1.2 mg/dL 0.3 0.4 0.4  Alkaline Phos 38 - 126 U/L 131(H) 151(H) 124  AST 15 - 41 U/L 38 25 20  ALT 0 - 44 U/L 52(H) 35 28       DIAGNOSTIC IMAGING:  I have independently reviewed scans and discussed with the patient.   ASSESSMENT & PLAN:   Endometrial cancer ([place]) 1.  Recurrent endometrial carcinosarcoma: -4 cycles of carboplatin and paclitaxel from 12/29/2018 through 03/02/2019. -CT from 02/28/2019 showed central pelvic mass decreased in size measuring 2.6 x 2.8 cm.  Last  CA-125 was 7 on 03/02/2019. -I have  reviewed her labs including CBC which is adequate to proceed with next treatment today. -We will reevaluate her in 3 weeks prior to cycle 6.  We will repeat scans after cycle 6.  2.  Urinary sepsis: -UA and culture was positive for E. coli. -She was started on Cipro 500 twice daily last week.  She is still has few more pills left. -She still has some right lateral abdominal wall tenderness.  However this has improved.  CT images did not show any pyelonephritis. -She will continue the remaining antibiotics.  Still has incomplete emptying of bladder at times.  3.  Leg pains: -She was instructed to take Percocet as needed during the first week when she develops leg pains after each treatment.  We have sent a refill.  4.  Nausea/vomiting: -She was told to take Compazine as needed during the first week when she feels nauseous.  5.  Elevated LFTs: -ALT was elevated at 52.  We will closely watch it.      Orders placed this encounter:  No orders of the defined types were placed in this encounter.     [name], MD [place] [phone]

## 2019-03-23 NOTE — Progress Notes (Signed)
1018 Labs reviewed with and pt seen by Dr. Delton Coombes and pt approved for Taxol and Carboplatin infusions today per MD           Jessica [name] tolerated chemo tx well without complaints or incident. VSS upon discharge. Pt discharged self ambulatory in satisfactory condition

## 2019-03-23 NOTE — Patient Instructions (Signed)
[facility] Discharge Instructions for Patients Receiving Chemotherapy   Beginning January 23rd 2017 lab work for the Cancer Center will be done in the  Main lab at [facility] on 1st floor. If you have a lab appointment with the Cancer Center please come in thru the  Main Entrance and check in at the main information desk   Today you received the following chemotherapy agents Taxol and Carboplatin. Follow-up as scheduled. Call clinic for any questions or concerns  To help prevent nausea and vomiting after your treatment, we encourage you to take your nausea medication.   If you develop nausea and vomiting, or diarrhea that is not controlled by your medication, call the clinic.  The clinic phone number is [phone]. Office hours are Monday-Friday 8:30am-5:00pm.  BELOW ARE SYMPTOMS THAT SHOULD BE REPORTED IMMEDIATELY:  *FEVER GREATER THAN 101.0 F  *CHILLS WITH OR WITHOUT FEVER  NAUSEA AND VOMITING THAT IS NOT CONTROLLED WITH YOUR NAUSEA MEDICATION  *UNUSUAL SHORTNESS OF BREATH  *UNUSUAL BRUISING OR BLEEDING  TENDERNESS IN MOUTH AND THROAT WITH OR WITHOUT PRESENCE OF ULCERS  *URINARY PROBLEMS  *BOWEL PROBLEMS  UNUSUAL RASH Items with * indicate a potential emergency and should be followed up as soon as possible. If you have an emergency after office hours please contact your primary care physician or go to the nearest emergency department.  Please call the clinic during office hours if you have any questions or concerns.   You may also contact the Patient Navigator at [phone] should you have any questions or need assistance in obtaining follow up care.      Resources For Cancer Patients and their Caregivers ? American Cancer Society: Can assist with transportation, wigs, general needs, runs Look Good Feel Better.        1-888-227-6333 ? Cancer Care: Provides financial assistance, online support groups, medication/co-pay assistance.   1-800-813-HOPE (4673) ? [facility] Assists [place] cancer patients and their families through emotional , educational and financial support.  336-427-4357 ? [place] DSS Where to apply for food stamps, Medicaid and utility assistance. 336-342-1394 ? [facility]: Transportation to medical appointments. 336-347-2287 ? Social Security Administration: May apply for disability if have a Stage IV cancer. 336-342-7796 1-800-772-1213 ? [place] Aging, Disability and Transit Services: Assists with nutrition, care and transit needs. 336-349-2343         

## 2019-03-23 NOTE — Progress Notes (Signed)
Patient has been assessed, vital signs and labs have been reviewed by Dr. Katragadda. ANC, Creatinine, LFTs, and Platelets are within treatment parameters per Dr. Katragadda. The patient is good to proceed with treatment at this time.  

## 2019-03-23 NOTE — Patient Instructions (Addendum)
[facility] at [facility] Discharge Instructions  You were seen today by Dr. Katragadda. He went over your recent lab results. He will see you back in 3 weeks for labs, treatment and follow up.   Thank you for choosing [facility] at [facility] to provide your oncology and hematology care.  To afford each patient quality time with our provider, please arrive at least 15 minutes before your scheduled appointment time.   If you have a lab appointment with the Cancer Center please come in thru the  Main Entrance and check in at the main information desk  You need to re-schedule your appointment should you arrive 10 or more minutes late.  We strive to give you quality time with our providers, and arriving late affects you and other patients whose appointments are after yours.  Also, if you no show three or more times for appointments you may be dismissed from the clinic at the providers discretion.     Again, thank you for choosing [facility].  Our hope is that these requests will decrease the amount of time that you wait before being seen by our physicians.       _____________________________________________________________  Should you have questions after your visit to [facility], please contact our office at [phone] between the hours of 8:00 a.m. and 4:30 p.m.  Voicemails left after 4:00 p.m. will not be returned until the following business day.  For prescription refill requests, have your pharmacy contact our office and allow 72 hours.    Cancer Center Support Programs:   > Cancer Support Group  2nd Tuesday of the month 1pm-2pm, Journey Room    

## 2019-03-25 ENCOUNTER — Other Ambulatory Visit: Payer: Self-pay

## 2019-03-25 ENCOUNTER — Inpatient Hospital Stay (HOSPITAL_COMMUNITY): Payer: Medicaid Other

## 2019-03-25 VITALS — BP 113/55 | HR 65 | Temp 96.7°F | Resp 18

## 2019-03-25 DIAGNOSIS — Z95828 Presence of other vascular implants and grafts: Secondary | ICD-10-CM

## 2019-03-25 DIAGNOSIS — Z5111 Encounter for antineoplastic chemotherapy: Secondary | ICD-10-CM | POA: Diagnosis not present

## 2019-03-25 DIAGNOSIS — C541 Malignant neoplasm of endometrium: Secondary | ICD-10-CM

## 2019-03-25 MED ORDER — PEGFILGRASTIM-CBQV 6 MG/0.6ML ~~LOC~~ SOSY
6.0000 mg | PREFILLED_SYRINGE | Freq: Once | SUBCUTANEOUS | Status: AC
  Administered 2019-03-25: 6 mg via SUBCUTANEOUS

## 2019-03-25 MED ORDER — PEGFILGRASTIM-CBQV 6 MG/0.6ML ~~LOC~~ SOSY
PREFILLED_SYRINGE | SUBCUTANEOUS | Status: AC
  Filled 2019-03-25: qty 0.6

## 2019-03-25 NOTE — Progress Notes (Signed)
..  Jessica [name] presents today for injection per the provider's orders. Udenyca administrated without incident; injection site WNL; see MAR for injection details.  Patient tolerated procedure well and without incident.  No questions or complaints noted at this time.

## 2019-03-26 NOTE — Assessment & Plan Note (Signed)
1.  Recurrent endometrial carcinosarcoma: -4 cycles of carboplatin and paclitaxel from 12/29/2018 through 03/02/2019. -CT from 02/28/2019 showed central pelvic mass decreased in size measuring 2.6 x 2.8 cm.  Last CA-125 was 7 on 03/02/2019. -I have reviewed her labs including CBC which is adequate to proceed with next treatment today. -We will reevaluate her in 3 weeks prior to cycle 6.  We will repeat scans after cycle 6.  2.  Urinary sepsis: -UA and culture was positive for E. coli. -She was started on Cipro 500 twice daily last week.  She is still has few more pills left. -She still has some right lateral abdominal wall tenderness.  However this has improved.  CT images did not show any pyelonephritis. -She will continue the remaining antibiotics.  Still has incomplete emptying of bladder at times.  3.  Leg pains: -She was instructed to take Percocet as needed during the first week when she develops leg pains after each treatment.  We have sent a refill.  4.  Nausea/vomiting: -She was told to take Compazine as needed during the first week when she feels nauseous.  5.  Elevated LFTs: -ALT was elevated at 52.  We will closely watch it.

## 2019-04-04 ENCOUNTER — Other Ambulatory Visit: Payer: Self-pay | Admitting: Physician Assistant

## 2019-04-12 ENCOUNTER — Ambulatory Visit: Payer: Self-pay | Admitting: Physician Assistant

## 2019-04-12 ENCOUNTER — Encounter: Payer: Self-pay | Admitting: Physician Assistant

## 2019-04-12 VITALS — BP 130/84 | HR 69 | Temp 98.0°F | Wt 219.2 lb

## 2019-04-12 DIAGNOSIS — I10 Essential (primary) hypertension: Secondary | ICD-10-CM

## 2019-04-12 DIAGNOSIS — Z1239 Encounter for other screening for malignant neoplasm of breast: Secondary | ICD-10-CM

## 2019-04-12 DIAGNOSIS — C541 Malignant neoplasm of endometrium: Secondary | ICD-10-CM

## 2019-04-12 DIAGNOSIS — E785 Hyperlipidemia, unspecified: Secondary | ICD-10-CM

## 2019-04-12 MED ORDER — AMLODIPINE BESYLATE 5 MG PO TABS: 5.0000 mg | ORAL_TABLET | Freq: Every day | ORAL | 1 refills | Status: DC

## 2019-04-12 NOTE — Progress Notes (Signed)
BP 130/84   Pulse 69   Temp 98 F (36.7 C)   Wt 219 lb 3.2 oz (99.4 kg)   SpO2 100%   BMI 40.09 kg/m    Subjective:    Patient ID: Jessica [name], female    DOB: 1960/10/05, 59 y.o.   MRN: [id number]  HPI: Jessica [name] is a 59 y.o. female presenting on 04/12/2019 for Hypertension and Hyperlipidemia   HPI   Pt had a negative covid 19 screening questionnaire.    Pt is 33yoF with endometrial cancer who was last seen by virtual appointment 3 months ago.  She is followed by oncology.   Lipids checked in [place]  She is feeling fine.  She says tomorrow is her last chemo treatment.    She says her Mood is okay.  She Has supportive friends and family  Pt has no complaints today.         Relevant past medical, surgical, family and social history reviewed and updated as indicated. Interim medical history since our last visit reviewed. Allergies and medications reviewed and updated.   Current Outpatient Medications:  .  amLODipine (NORVASC) 5 MG tablet, Take 1 tablet (5 mg total) by mouth daily., Disp: 30 tablet, Rfl: 6 .  atorvastatin (LIPITOR) 20 MG tablet, Take 1 tablet (20 mg total) by mouth daily., Disp: 90 tablet, Rfl: 1 .  CARBOPLATIN IV, Inject into the vein every 21 ( twenty-one) days., Disp: , Rfl:  .  Multiple Vitamin (MULTIVITAMIN) tablet, Take 1 tablet by mouth daily., Disp: , Rfl:  .  oxyCODONE-acetaminophen (PERCOCET) 5-325 MG tablet, Take 1 tablet by mouth every 8 (eight) hours as needed (pain)., Disp: 30 tablet, Rfl: 0 .  PACLITAXEL IV, Inject into the vein every 21 ( twenty-one) days., Disp: , Rfl:  .  polyethylene glycol (MIRALAX / GLYCOLAX) 17 g packet, Take 17 g by mouth 2 (two) times daily as needed., Disp: , Rfl:  .  dicyclomine (BENTYL) 20 MG tablet, Take 1 tablet (20 mg total) by mouth every 6 (six) hours. (Patient not taking: Reported on 03/21/2019), Disp: 30 tablet, Rfl: 1 .  ibuprofen (ADVIL) 200 MG tablet, Take 200 mg by mouth every 6 (six)  hours as needed., Disp: , Rfl:  .  lidocaine-prilocaine (EMLA) cream, Apply small amount to port a cath site and cover with plastic wrap one hour prior to chemotherapy appointments (Patient not taking: Reported on 03/21/2019), Disp: 30 g, Rfl: 3 .  phenazopyridine (PYRIDIUM) 100 MG tablet, Take 1 tablet (100 mg total) by mouth 2 (two) times daily as needed for pain. (Patient not taking: Reported on 03/21/2019), Disp: 10 tablet, Rfl: 0 .  prochlorperazine (COMPAZINE) 10 MG tablet, Take 1 tablet (10 mg total) by mouth every 6 (six) hours as needed (Nausea or vomiting). (Patient not taking: Reported on 03/21/2019), Disp: 30 tablet, Rfl: 1    Review of Systems  Per HPI unless specifically indicated above     Objective:    BP 130/84   Pulse 69   Temp 98 F (36.7 C)   Wt 219 lb 3.2 oz (99.4 kg)   SpO2 100%   BMI 40.09 kg/m   Wt Readings from Last 3 Encounters:  04/12/19 219 lb 3.2 oz (99.4 kg)  03/23/19 218 lb 14.4 oz (99.3 kg)  03/21/19 215 lb 12.8 oz (97.9 kg)    Physical Exam Vitals reviewed.  Constitutional:      General: She is not in acute distress.  Appearance: She is well-developed.  HENT:     Head: Normocephalic and atraumatic.  Cardiovascular:     Rate and Rhythm: Normal rate and regular rhythm.  Pulmonary:     Effort: Pulmonary effort is normal.     Breath sounds: Normal breath sounds.  Abdominal:     General: Bowel sounds are normal.     Palpations: Abdomen is soft. There is no mass.     Tenderness: There is no abdominal tenderness.  Musculoskeletal:     Cervical back: Neck supple.     Right lower leg: No edema.     Left lower leg: No edema.  Lymphadenopathy:     Cervical: No cervical adenopathy.  Skin:    General: Skin is warm and dry.  Neurological:     Mental Status: She is alert and oriented to person, place, and time.  Psychiatric:        Attention and Perception: Attention normal.        Mood and Affect: Mood normal.        Speech: Speech normal.         Behavior: Behavior normal. Behavior is cooperative.           Assessment & Plan:     Encounter Diagnoses  Name Primary?  . Essential hypertension Yes  . Hyperlipidemia, unspecified hyperlipidemia type   . Encounter for screening for malignant neoplasm of breast, unspecified screening modality   . Endometrial cancer [facility])        -will refer for screening mammogram (after April 8 per patient request) -Pt is planning to go get covid vaccine today at [facility].  She will call if that doesn't work out to get signed up for vaccination here -pt to continue current medications -pt to continue with oncologist per his recommendations -pt to follow up here 3 months.  She is to contact office sooner prn

## 2019-04-13 ENCOUNTER — Other Ambulatory Visit: Payer: Self-pay

## 2019-04-13 ENCOUNTER — Inpatient Hospital Stay (HOSPITAL_COMMUNITY): Payer: Medicaid Other

## 2019-04-13 ENCOUNTER — Other Ambulatory Visit: Payer: Self-pay | Admitting: Student

## 2019-04-13 ENCOUNTER — Inpatient Hospital Stay (HOSPITAL_BASED_OUTPATIENT_CLINIC_OR_DEPARTMENT_OTHER): Payer: Medicaid Other | Admitting: Hematology

## 2019-04-13 VITALS — BP 138/78 | HR 82 | Temp 97.1°F | Resp 18 | Wt 216.2 lb

## 2019-04-13 VITALS — BP 145/77 | HR 76 | Temp 98.2°F | Resp 18

## 2019-04-13 DIAGNOSIS — C541 Malignant neoplasm of endometrium: Secondary | ICD-10-CM | POA: Diagnosis not present

## 2019-04-13 DIAGNOSIS — Z1239 Encounter for other screening for malignant neoplasm of breast: Secondary | ICD-10-CM

## 2019-04-13 DIAGNOSIS — Z5111 Encounter for antineoplastic chemotherapy: Secondary | ICD-10-CM | POA: Diagnosis not present

## 2019-04-13 DIAGNOSIS — Z95828 Presence of other vascular implants and grafts: Secondary | ICD-10-CM

## 2019-04-13 LAB — COMPREHENSIVE METABOLIC PANEL
ALT: 28 U/L (ref 0–44)
AST: 20 U/L (ref 15–41)
Albumin: 3.9 g/dL (ref 3.5–5.0)
Alkaline Phosphatase: 122 U/L (ref 38–126)
Anion gap: 9 (ref 5–15)
BUN: 14 mg/dL (ref 6–20)
CO2: 25 mmol/L (ref 22–32)
Calcium: 9 mg/dL (ref 8.9–10.3)
Chloride: 107 mmol/L (ref 98–111)
Creatinine, Ser: 0.45 mg/dL (ref 0.44–1.00)
GFR calc Af Amer: >60 mL/min (ref >60)
GFR calc non Af Amer: >60 mL/min (ref >60)
Glucose, Bld: 100 mg/dL — ABNORMAL HIGH (ref 70–99)
Potassium: 3.6 mmol/L (ref 3.5–5.1)
Sodium: 141 mmol/L (ref 135–145)
Total Bilirubin: 0.3 mg/dL (ref 0.3–1.2)
Total Protein: 6.7 g/dL (ref 6.5–8.1)

## 2019-04-13 LAB — CBC WITH DIFFERENTIAL/PLATELET
Abs Immature Granulocytes: 0.01 10*3/uL (ref 0.00–0.07)
Basophils Absolute: 0 10*3/uL (ref 0.0–0.1)
Basophils Relative: 1 %
Eosinophils Absolute: 0.1 10*3/uL (ref 0.0–0.5)
Eosinophils Relative: 1 %
HCT: 31.2 % — ABNORMAL LOW (ref 36.0–46.0)
Hemoglobin: 10.2 g/dL — ABNORMAL LOW (ref 12.0–15.0)
Immature Granulocytes: 0 %
Lymphocytes Relative: 39 %
Lymphs Abs: 1.9 10*3/uL (ref 0.7–4.0)
MCH: 30.3 pg (ref 26.0–34.0)
MCHC: 32.7 g/dL (ref 30.0–36.0)
MCV: 92.6 fL (ref 80.0–100.0)
Monocytes Absolute: 0.5 10*3/uL (ref 0.1–1.0)
Monocytes Relative: 9 %
Neutro Abs: 2.4 10*3/uL (ref 1.7–7.7)
Neutrophils Relative %: 50 %
Platelets: 179 10*3/uL (ref 150–400)
RBC: 3.37 MIL/uL — ABNORMAL LOW (ref 3.87–5.11)
RDW: 21.9 % — ABNORMAL HIGH (ref 11.5–15.5)
WBC: 4.8 10*3/uL (ref 4.0–10.5)
nRBC: 0 % (ref 0.0–0.2)

## 2019-04-13 MED ORDER — SODIUM CHLORIDE 0.9 % IV SOLN
150.0000 mg | Freq: Once | INTRAVENOUS | Status: AC
  Administered 2019-04-13: 150 mg via INTRAVENOUS
  Filled 2019-04-13: qty 150

## 2019-04-13 MED ORDER — SODIUM CHLORIDE 0.9 % IV SOLN
10.0000 mg | Freq: Once | INTRAVENOUS | Status: AC
  Administered 2019-04-13: 11:00:00 10 mg via INTRAVENOUS
  Filled 2019-04-13: qty 10

## 2019-04-13 MED ORDER — FAMOTIDINE IN NACL 20-0.9 MG/50ML-% IV SOLN
20.0000 mg | Freq: Once | INTRAVENOUS | Status: AC
  Administered 2019-04-13: 20 mg via INTRAVENOUS
  Filled 2019-04-13: qty 50

## 2019-04-13 MED ORDER — SODIUM CHLORIDE 0.9 % IV SOLN
175.0000 mg/m2 | Freq: Once | INTRAVENOUS | Status: AC
  Administered 2019-04-13: 12:00:00 360 mg via INTRAVENOUS
  Filled 2019-04-13: qty 60

## 2019-04-13 MED ORDER — SODIUM CHLORIDE 0.9% FLUSH
10.0000 mL | INTRAVENOUS | Status: DC | PRN
  Administered 2019-04-13: 10 mL

## 2019-04-13 MED ORDER — HEPARIN SOD (PORK) LOCK FLUSH 100 UNIT/ML IV SOLN
500.0000 [IU] | Freq: Once | INTRAVENOUS | Status: AC | PRN
  Administered 2019-04-13: 16:00:00 500 [IU]

## 2019-04-13 MED ORDER — SODIUM CHLORIDE 0.9 % IV SOLN: Freq: Once | INTRAVENOUS | Status: AC

## 2019-04-13 MED ORDER — PALONOSETRON HCL INJECTION 0.25 MG/5ML
0.2500 mg | Freq: Once | INTRAVENOUS | Status: AC
  Administered 2019-04-13: 0.25 mg via INTRAVENOUS
  Filled 2019-04-13: qty 5

## 2019-04-13 MED ORDER — SODIUM CHLORIDE 0.9 % IV SOLN
862.8000 mg | Freq: Once | INTRAVENOUS | Status: AC
  Administered 2019-04-13: 860 mg via INTRAVENOUS
  Filled 2019-04-13: qty 86

## 2019-04-13 MED ORDER — DIPHENHYDRAMINE HCL 50 MG/ML IJ SOLN
50.0000 mg | Freq: Once | INTRAMUSCULAR | Status: AC
  Administered 2019-04-13: 50 mg via INTRAVENOUS
  Filled 2019-04-13: qty 1

## 2019-04-13 NOTE — Patient Instructions (Addendum)
[facility] at [facility] Discharge Instructions  You were seen today by Dr. Katragadda. He went over your recent lab results. He will repeat your scans prior to your next visit. He will see you back in 3 weeks for labs, treatment and follow up.   Thank you for choosing [facility] at [facility] to provide your oncology and hematology care.  To afford each patient quality time with our provider, please arrive at least 15 minutes before your scheduled appointment time.   If you have a lab appointment with the Cancer Center please come in thru the  Main Entrance and check in at the main information desk  You need to re-schedule your appointment should you arrive 10 or more minutes late.  We strive to give you quality time with our providers, and arriving late affects you and other patients whose appointments are after yours.  Also, if you no show three or more times for appointments you may be dismissed from the clinic at the providers discretion.     Again, thank you for choosing [facility].  Our hope is that these requests will decrease the amount of time that you wait before being seen by our physicians.       _____________________________________________________________  Should you have questions after your visit to [facility], please contact our office at [phone] between the hours of 8:00 a.m. and 4:30 p.m.  Voicemails left after 4:00 p.m. will not be returned until the following business day.  For prescription refill requests, have your pharmacy contact our office and allow 72 hours.    Cancer Center Support Programs:   > Cancer Support Group  2nd Tuesday of the month 1pm-2pm, Journey Room    

## 2019-04-13 NOTE — Progress Notes (Signed)
[address], [place] [address]   CLINIC:  Medical Oncology/Hematology  PCP:  Soyla Dryer, PA-C [place] [address] [phone]   REASON FOR VISIT:  Follow-up for recurrent endometrial carcinosarcoma.  CURRENT THERAPY: Carboplatin and paclitaxel every 3 weeks.  BRIEF ONCOLOGIC HISTORY:  Oncology History  Endometrial cancer ([place])  03/31/2018 Initial Diagnosis   Endometrial cancer ([place])   12/29/2018 -  Chemotherapy   The patient had palonosetron (ALOXI) injection 0.25 mg, 0.25 mg, Intravenous,  Once, 6 of 6 cycles Administration: 0.25 mg (12/29/2018), 0.25 mg (01/19/2019), 0.25 mg (02/09/2019), 0.25 mg (03/02/2019), 0.25 mg (03/23/2019), 0.25 mg (04/13/2019) pegfilgrastim-cbqv (UDENYCA) injection 6 mg, 6 mg, Subcutaneous, Once, 6 of 6 cycles Administration: 6 mg (12/31/2018), 6 mg (01/20/2019), 6 mg (02/11/2019), 6 mg (03/04/2019), 6 mg (03/25/2019), 6 mg (04/15/2019) CARBOplatin (PARAPLATIN) 860 mg in sodium chloride 0.9 % 500 mL chemo infusion, 860 mg (100 % of original dose 862.8 mg), Intravenous,  Once, 6 of 6 cycles Dose modification:   (original dose 862.8 mg, Cycle 1),   (original dose 862.8 mg, Cycle 2),   (original dose 862.8 mg, Cycle 3),   (original dose 862.8 mg, Cycle 4),   (original dose 862.8 mg, Cycle 5),   (original dose 862.8 mg, Cycle 6) Administration: 860 mg (12/29/2018), 860 mg (01/19/2019), 860 mg (02/09/2019), 860 mg (03/02/2019), 860 mg (03/23/2019), 860 mg (04/13/2019) fosaprepitant (EMEND) 150 mg in sodium chloride 0.9 % 145 mL IVPB, 150 mg, Intravenous,  Once, 6 of 6 cycles Administration: 150 mg (01/19/2019), 150 mg (02/09/2019), 150 mg (03/02/2019), 150 mg (03/23/2019), 150 mg (04/13/2019) PACLitaxel (TAXOL) 360 mg in sodium chloride 0.9 % 500 mL chemo infusion (> 80mg /m2), 175 mg/m2 = 360 mg, Intravenous,  Once, 6 of 6 cycles Administration: 360 mg (12/29/2018), 360 mg (01/19/2019), 360 mg (02/09/2019), 360 mg  (03/02/2019), 360 mg (03/23/2019), 360 mg (04/13/2019)  for chemotherapy treatment.       CANCER STAGING: Cancer Staging No matching staging information was found for the patient.   INTERVAL HISTORY:  Ms. Lizak 59 y.o. female seen for follow-up of endometrial carcinoma and toxicity assessment prior to cycle 6 of chemotherapy.  She had leg pains which lasted about 1 week.  She also has associated tingling with it.  She took 2 pills of oxycodone on average daily.  She did get some constipation and takes MiraLAX and stool softeners.  She had nausea for the first week but denied any vomiting.  She continues to have a feeling of incomplete evacuation of the bladder.  REVIEW OF SYSTEMS:  Review of Systems  Gastrointestinal: Positive for constipation and nausea.  Genitourinary: Positive for difficulty urinating.   Neurological: Positive for numbness.  Psychiatric/Behavioral: Positive for sleep disturbance.  All other systems reviewed and are negative.    PAST MEDICAL/SURGICAL HISTORY:  Past Medical History:  Diagnosis Date  . Elevated blood pressure reading   . Endometrial cancer ([place])   . PMB (postmenopausal bleeding)   . Port-A-Cath in place 12/24/2018   Past Surgical History:  Procedure Laterality Date  . IR IMAGING GUIDED PORT INSERTION  12/28/2018  . IR RADIOLOGIST EVAL & MGMT  06/02/2018  . IR RADIOLOGIST EVAL & MGMT  06/16/2018  . NECK SURGERY  2000   spinal surgery , titanium rod in place   . ROBOTIC ASSISTED TOTAL HYSTERECTOMY WITH BILATERAL SALPINGO OOPHERECTOMY N/A 04/13/2018   Procedure: XI ROBOTIC ASSISTED TOTAL HYSTERECTOMY WITH BILATERAL SALPINGO OOPHORECTOMY;  Surgeon:  Everitt Amber, MD;  Location: [facility]S;  Service: Gynecology;  Laterality: N/A;  . ROBOTIC PELVIC AND PARA-AORTIC LYMPH NODE DISSECTION N/A 04/13/2018   Procedure: XI ROBOTIC PELVIC LYMPHADECTOMY AND PARA-AORTIC LYMPH NODE DISSECTION;  Surgeon: Everitt Amber, MD;  Location: [facility];  Service: Gynecology;   Laterality: N/A;     SOCIAL HISTORY:  Social History   Socioeconomic History  . Marital status: Widowed    Spouse name: Not on file  . Number of children: Not on file  . Years of education: Not on file  . Highest education level: Not on file  Occupational History  . Not on file  Tobacco Use  . Smoking status: Never Smoker  . Smokeless tobacco: Never Used  Substance and Sexual Activity  . Alcohol use: Yes    Alcohol/week: 4.0 standard drinks    Types: 4 Glasses of wine per week    Comment: weekends  . Drug use: Never  . Sexual activity: Not Currently  Other Topics Concern  . Not on file  Social History Narrative  . Not on file   Social Determinants of Health   Financial Resource Strain:   . Difficulty of Paying Living Expenses:   Food Insecurity:   . Worried About Charity fundraiser in the Last Year:   . Arboriculturist in the Last Year:   Transportation Needs:   . Film/video editor (Medical):   Marland Kitchen Lack of Transportation (Non-Medical):   Physical Activity:   . Days of Exercise per Week:   . Minutes of Exercise per Session:   Stress:   . Feeling of Stress :   Social Connections:   . Frequency of Communication with Friends and Family:   . Frequency of Social Gatherings with Friends and Family:   . Attends Religious Services:   . Active Member of Clubs or Organizations:   . Attends Archivist Meetings:   Marland Kitchen Marital Status:   Intimate Partner Violence:   . Fear of Current or Ex-Partner:   . Emotionally Abused:   Marland Kitchen Physically Abused:   . Sexually Abused:     FAMILY HISTORY:  Family History  Problem Relation Age of Onset  . Hypertension Father   . Heart disease Father   . Cancer Father        lung  . Hypertension Mother     CURRENT MEDICATIONS:  Outpatient Encounter Medications as of 04/13/2019  Medication Sig  . amLODipine (NORVASC) 5 MG tablet Take 1 tablet (5 mg total) by mouth daily.  Marland Kitchen atorvastatin (LIPITOR) 20 MG tablet Take 1  tablet (20 mg total) by mouth daily.  Marland Kitchen CARBOPLATIN IV Inject into the vein every 21 ( twenty-one) days.  . Multiple Vitamin (MULTIVITAMIN) tablet Take 1 tablet by mouth daily.  Marland Kitchen PACLITAXEL IV Inject into the vein every 21 ( twenty-one) days.  Marland Kitchen dicyclomine (BENTYL) 20 MG tablet Take 1 tablet (20 mg total) by mouth every 6 (six) hours.  Marland Kitchen ibuprofen (ADVIL) 200 MG tablet Take 200 mg by mouth every 6 (six) hours as needed.  . lidocaine-prilocaine (EMLA) cream Apply small amount to port a cath site and cover with plastic wrap one hour prior to chemotherapy appointments  . oxyCODONE-acetaminophen (PERCOCET) 5-325 MG tablet Take 1 tablet by mouth every 8 (eight) hours as needed (pain).  . phenazopyridine (PYRIDIUM) 100 MG tablet Take 1 tablet (100 mg total) by mouth 2 (two) times daily as needed for pain.  . polyethylene glycol (MIRALAX /  GLYCOLAX) 17 g packet Take 17 g by mouth 2 (two) times daily as needed.  . prochlorperazine (COMPAZINE) 10 MG tablet Take 1 tablet (10 mg total) by mouth every 6 (six) hours as needed (Nausea or vomiting).   No facility-administered encounter medications on file as of 04/13/2019.    ALLERGIES:  No Known Allergies   PHYSICAL EXAM:  ECOG Performance status: 1  Vitals:   04/13/19 0851  BP: 138/78  Pulse: 82  Resp: 18  Temp: (!) 97.1 F (36.2 C)  SpO2: 97%   Filed Weights   04/13/19 0851  Weight: 216 lb 3.2 oz (98.1 kg)    Physical Exam Vitals reviewed.  Constitutional:      Appearance: Normal appearance.  Cardiovascular:     Rate and Rhythm: Normal rate and regular rhythm.     Heart sounds: Normal heart sounds.  Pulmonary:     Effort: Pulmonary effort is normal.     Breath sounds: Normal breath sounds.  Abdominal:     General: There is no distension.     Palpations: Abdomen is soft. There is no mass.  Skin:    General: Skin is warm.  Neurological:     General: No focal deficit present.     Mental Status: She is alert and oriented to  person, place, and time.  Psychiatric:        Mood and Affect: Mood normal.        Behavior: Behavior normal.    Mild tenderness in the right lateral abdominal wall.  LABORATORY DATA:  I have reviewed the labs as listed.  CBC    Component Value Date/Time   WBC 4.8 04/13/2019 0845   RBC 3.37 (L) 04/13/2019 0845   HGB 10.2 (L) 04/13/2019 0845   HCT 31.2 (L) 04/13/2019 0845   PLT 179 04/13/2019 0845   MCV 92.6 04/13/2019 0845   MCH 30.3 04/13/2019 0845   MCHC 32.7 04/13/2019 0845   RDW 21.9 (H) 04/13/2019 0845   LYMPHSABS 1.9 04/13/2019 0845   MONOABS 0.5 04/13/2019 0845   EOSABS 0.1 04/13/2019 0845   BASOSABS 0.0 04/13/2019 0845   CMP Latest Ref Rng & Units 04/13/2019 03/23/2019 03/18/2019  Glucose 70 - 99 mg/dL 100(H) 98 103(H)  BUN 6 - 20 mg/dL 14 23(H) 19  Creatinine 0.44 - 1.00 mg/dL 0.45 0.44 0.44  Sodium 135 - 145 mmol/L 141 139 140  Potassium 3.5 - 5.1 mmol/L 3.6 3.8 4.2  Chloride 98 - 111 mmol/L 107 106 107  CO2 22 - 32 mmol/L 25 26 25   Calcium 8.9 - 10.3 mg/dL 9.0 8.9 9.4  Total Protein 6.5 - 8.1 g/dL 6.7 6.8 7.3  Total Bilirubin 0.3 - 1.2 mg/dL 0.3 0.3 0.4  Alkaline Phos 38 - 126 U/L 122 131(H) 151(H)  AST 15 - 41 U/L 20 38 25  ALT 0 - 44 U/L 28 52(H) 35       DIAGNOSTIC IMAGING:  I have independently reviewed the scans.   ASSESSMENT & PLAN:   Endometrial cancer ([place]) 1.  Recurrent endometrial carcinosarcoma: -5 cycles of carboplatin and paclitaxel from 12/29/2018 through 03/23/2019. -CT on 02/28/2019 showed central pelvic mass decreased in size measuring 2.6 x 2.8 cm.  Last CA-125 was 7 on 03/02/2019. -She reports having leg pains for 1 week after each treatment.  She also has tingling in the legs for 1 week.  She had nausea for 1 week without any vomiting. -I have reviewed her labs.  White count is 4.8 with  normal platelet count.  Creatinine was normal and LFTs were normal. -She will proceed with cycle 6 today.  She would like to visit her mother in [place]  from 04/21/2019 through 04/27/2019. -I plan to repeat CT of abdomen and pelvis.  She will receive treatment beyond 6 cycles if there is still persistent disease.  I will also recommend follow-up with Dr. Denman George.  I plan to see her back in 3 weeks for follow-up.  2.  Leg pains: -She is taking Percocet twice daily as needed during the first week when she gets leg pains after each treatment.  3.  Urinary symptoms: -She had E. coli positive in the cultures from February 2021.  She was treated with Cipro. -She still has incomplete evacuation of the bladder each time she goes.  This is likely from the tumor mass pressing on the bladder.  4.  Elevated LFTs: -ALT was elevated at 52 last time.  It is normal today.  5.  Nausea: -She has nausea for 1 week without any vomiting. -She will take Compazine as needed.      Orders placed this encounter:  Orders Placed This Encounter  Procedures  . CT Abdomen Pelvis W Contrast  . CT Chest W Contrast  . CA 125  . CBC with Differential  . Comprehensive metabolic panel      [name], MD [place] [phone]

## 2019-04-13 NOTE — Progress Notes (Signed)
1009 Labs reviewed with and pt seen by Dr. Delton Coombes and pt approved for Taxol and Carboplatin infusions today per MD           Beverly Gust Tufaro tolerated chemo tx well without complaints or incident. VSS upon discharge. Pt discharged self ambulatory in satisfactory condition

## 2019-04-13 NOTE — Progress Notes (Signed)
Patient has been assessed, vital signs and labs have been reviewed by Dr. Katragadda. ANC, Creatinine, LFTs, and Platelets are within treatment parameters per Dr. Katragadda. The patient is good to proceed with treatment at this time.  

## 2019-04-13 NOTE — Patient Instructions (Signed)
[facility] Discharge Instructions for Patients Receiving Chemotherapy   Beginning January 23rd 2017 lab work for the Cancer Center will be done in the  Main lab at [place] on 1st floor. If you have a lab appointment with the Cancer Center please come in thru the  Main Entrance and check in at the main information desk   Today you received the following chemotherapy agents Taxol and Carboplatin. Follow-up as scheduled. Call clinic for any questions or concerns  To help prevent nausea and vomiting after your treatment, we encourage you to take your nausea medication.   If you develop nausea and vomiting, or diarrhea that is not controlled by your medication, call the clinic.  The clinic phone number is [phone]. Office hours are Monday-Friday 8:30am-5:00pm.  BELOW ARE SYMPTOMS THAT SHOULD BE REPORTED IMMEDIATELY:  *FEVER GREATER THAN 101.0 F  *CHILLS WITH OR WITHOUT FEVER  NAUSEA AND VOMITING THAT IS NOT CONTROLLED WITH YOUR NAUSEA MEDICATION  *UNUSUAL SHORTNESS OF BREATH  *UNUSUAL BRUISING OR BLEEDING  TENDERNESS IN MOUTH AND THROAT WITH OR WITHOUT PRESENCE OF ULCERS  *URINARY PROBLEMS  *BOWEL PROBLEMS  UNUSUAL RASH Items with * indicate a potential emergency and should be followed up as soon as possible. If you have an emergency after office hours please contact your primary care physician or go to the nearest emergency department.  Please call the clinic during office hours if you have any questions or concerns.   You may also contact the Patient Navigator at [phone] should you have any questions or need assistance in obtaining follow up care.      Resources For Cancer Patients and their Caregivers ? American Cancer Society: Can assist with transportation, wigs, general needs, runs Look Good Feel Better.        1-888-227-6333 ? Cancer Care: Provides financial assistance, online support groups, medication/co-pay assistance.   1-800-813-HOPE (4673) ? [facility] Assists [place] cancer patients and their families through emotional , educational and financial support.  336-427-4357 ? [place] DSS Where to apply for food stamps, Medicaid and utility assistance. 336-342-1394 ? [facility]: Transportation to medical appointments. 336-347-2287 ? Social Security Administration: May apply for disability if have a Stage IV cancer. 336-342-7796 1-800-772-1213 ? [place] Aging, Disability and Transit Services: Assists with nutrition, care and transit needs. 336-349-2343         

## 2019-04-15 ENCOUNTER — Other Ambulatory Visit: Payer: Self-pay

## 2019-04-15 ENCOUNTER — Inpatient Hospital Stay (HOSPITAL_COMMUNITY): Payer: Medicaid Other

## 2019-04-15 VITALS — BP 143/74 | HR 67 | Temp 96.9°F | Resp 18

## 2019-04-15 DIAGNOSIS — Z95828 Presence of other vascular implants and grafts: Secondary | ICD-10-CM

## 2019-04-15 DIAGNOSIS — C541 Malignant neoplasm of endometrium: Secondary | ICD-10-CM

## 2019-04-15 DIAGNOSIS — Z5111 Encounter for antineoplastic chemotherapy: Secondary | ICD-10-CM | POA: Diagnosis not present

## 2019-04-15 MED ORDER — PEGFILGRASTIM-CBQV 6 MG/0.6ML ~~LOC~~ SOSY
6.0000 mg | PREFILLED_SYRINGE | Freq: Once | SUBCUTANEOUS | Status: AC
  Administered 2019-04-15: 6 mg via SUBCUTANEOUS

## 2019-04-15 MED ORDER — PEGFILGRASTIM-CBQV 6 MG/0.6ML ~~LOC~~ SOSY
PREFILLED_SYRINGE | SUBCUTANEOUS | Status: AC
  Filled 2019-04-15: qty 0.6

## 2019-04-15 NOTE — Patient Instructions (Signed)
[facility] at [facility]  Discharge Instructions:   _______________________________________________________________  Thank you for choosing [facility] at [facility] to provide your oncology and hematology care.  To afford each patient quality time with our providers, please arrive at least 15 minutes before your scheduled appointment.  You need to re-schedule your appointment if you arrive 10 or more minutes late.  We strive to give you quality time with our providers, and arriving late affects you and other patients whose appointments are after yours.  Also, if you no show three or more times for appointments you may be dismissed from the clinic.  Again, thank you for choosing [facility] at [facility]. Our hope is that these requests will allow you access to exceptional care and in a timely manner. _______________________________________________________________  If you have questions after your visit, please contact our office at [phone] between the hours of 8:30 a.m. and 5:00 p.m. Voicemails left after 4:30 p.m. will not be returned until the following business day. _______________________________________________________________  For prescription refill requests, have your pharmacy contact our office. _______________________________________________________________  Recommendations made by the consultant and any test results will be sent to your referring physician. _______________________________________________________________ 

## 2019-04-15 NOTE — Progress Notes (Signed)
Mahlaya Kieler Seif presents today for injection per MD orders. Udenyca administered SQ in left Upper Arm. Administration without incident. Patient tolerated well. No complaints at this time. Discharged from clinic ambulatory. F/U with [facility] as scheduled.

## 2019-04-16 NOTE — Assessment & Plan Note (Signed)
1.  Recurrent endometrial carcinosarcoma: -5 cycles of carboplatin and paclitaxel from 12/29/2018 through 03/23/2019. -CT on 02/28/2019 showed central pelvic mass decreased in size measuring 2.6 x 2.8 cm.  Last CA-125 was 7 on 03/02/2019. -She reports having leg pains for 1 week after each treatment.  She also has tingling in the legs for 1 week.  She had nausea for 1 week without any vomiting. -I have reviewed her labs.  White count is 4.8 with normal platelet count.  Creatinine was normal and LFTs were normal. -She will proceed with cycle 6 today.  She would like to visit her mother in [place] from 04/21/2019 through 04/27/2019. -I plan to repeat CT of abdomen and pelvis.  She will receive treatment beyond 6 cycles if there is still persistent disease.  I will also recommend follow-up with Dr. Denman George.  I plan to see her back in 3 weeks for follow-up.  2.  Leg pains: -She is taking Percocet twice daily as needed during the first week when she gets leg pains after each treatment.  3.  Urinary symptoms: -She had E. coli positive in the cultures from February 2021.  She was treated with Cipro. -She still has incomplete evacuation of the bladder each time she goes.  This is likely from the tumor mass pressing on the bladder.  4.  Elevated LFTs: -ALT was elevated at 52 last time.  It is normal today.  5.  Nausea: -She has nausea for 1 week without any vomiting. -She will take Compazine as needed.

## 2019-04-18 ENCOUNTER — Emergency Department (HOSPITAL_COMMUNITY)
Admission: EM | Admit: 2019-04-18 | Discharge: 2019-04-18 | Disposition: A | Discharge status: Home / Self Care | Payer: Medicaid Other | Attending: Emergency Medicine | Admitting: Emergency Medicine

## 2019-04-18 ENCOUNTER — Encounter (HOSPITAL_COMMUNITY): Payer: Self-pay

## 2019-04-18 ENCOUNTER — Emergency Department (HOSPITAL_COMMUNITY): Payer: Medicaid Other

## 2019-04-18 ENCOUNTER — Other Ambulatory Visit: Payer: Self-pay

## 2019-04-18 DIAGNOSIS — Y9389 Activity, other specified: Secondary | ICD-10-CM | POA: Insufficient documentation

## 2019-04-18 DIAGNOSIS — S92531A Displaced fracture of distal phalanx of right lesser toe(s), initial encounter for closed fracture: Secondary | ICD-10-CM | POA: Diagnosis not present

## 2019-04-18 DIAGNOSIS — C55 Malignant neoplasm of uterus, part unspecified: Secondary | ICD-10-CM | POA: Insufficient documentation

## 2019-04-18 DIAGNOSIS — Y929 Unspecified place or not applicable: Secondary | ICD-10-CM | POA: Diagnosis not present

## 2019-04-18 DIAGNOSIS — Z79899 Other long term (current) drug therapy: Secondary | ICD-10-CM | POA: Insufficient documentation

## 2019-04-18 DIAGNOSIS — S99921A Unspecified injury of right foot, initial encounter: Secondary | ICD-10-CM | POA: Diagnosis present

## 2019-04-18 DIAGNOSIS — W208XXA Other cause of strike by thrown, projected or falling object, initial encounter: Secondary | ICD-10-CM | POA: Insufficient documentation

## 2019-04-18 DIAGNOSIS — S92534A Nondisplaced fracture of distal phalanx of right lesser toe(s), initial encounter for closed fracture: Secondary | ICD-10-CM

## 2019-04-18 DIAGNOSIS — Y999 Unspecified external cause status: Secondary | ICD-10-CM | POA: Diagnosis not present

## 2019-04-18 NOTE — ED Triage Notes (Signed)
Pt reports she was getting a suitcase out of her closet and an old piece of a gun fell on her right foot. Pt reports feels like her 2nd toe is crushed

## 2019-04-18 NOTE — Discharge Instructions (Addendum)
Elevate your foot when possible.  You may apply ice packs on and on to help control bruising and swelling.  Keep the toes buddy taped, you may remove for bathing.  Wear the postop shoe as needed for support to your foot.  You may follow-up with the orthopedic provider listed if needed.

## 2019-04-18 NOTE — ED Provider Notes (Signed)
[facility] EMERGENCY DEPARTMENT Provider Note   CSN: [id number] Arrival date & time: 04/18/19  1547     History Chief Complaint  Patient presents with  . Foot Pain    Jessica [name] is a 59 y.o. female.  HPI      Jessica [name] is a 59 y.o. female with a history that's significant for HTN, endometrial cancer currently undergoing chemotherapy, who presents to the Emergency Department complaining of pain to the the toes of her right foot.  She states that a heavy piece of metal from an old gun fell onto the top of her foot.  Incident occurred less than one hour ago.  She describes a throbbing pain to all the toes of her foot, but the pain to the right second toe is worse.  She also states that her toes feel "numb and tingly." she denies open wounds and injury to the nail.  Ankle is non-tender.     Past Medical History:  Diagnosis Date  . Elevated blood pressure reading   . Endometrial cancer ([place])   . PMB (postmenopausal bleeding)   . Port-A-Cath in place 12/24/2018    Patient Active Problem List   Diagnosis Date Noted  . Port-A-Cath in place 12/24/2018  . Goals of care, counseling/discussion 12/23/2018  . 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 Elevated LFTs 05/05/2018  . Hyperlipidemia 04/27/2018  . Essential hypertension 04/26/2018  . Endometrial cancer ([place]) 03/31/2018    Past Surgical History:  Procedure Laterality Date  . IR IMAGING GUIDED PORT INSERTION  12/28/2018  . IR RADIOLOGIST EVAL & MGMT  06/02/2018  . IR RADIOLOGIST EVAL & MGMT  06/16/2018  . NECK SURGERY  2000   spinal surgery , titanium rod in place   . ROBOTIC ASSISTED TOTAL HYSTERECTOMY WITH BILATERAL SALPINGO OOPHERECTOMY N/A 04/13/2018   Procedure: XI ROBOTIC ASSISTED TOTAL HYSTERECTOMY WITH BILATERAL SALPINGO OOPHORECTOMY;  Surgeon: Everitt Amber, MD;  Location: [facility] ORS;  Service: Gynecology;  Laterality: N/A;  . ROBOTIC PELVIC AND PARA-AORTIC LYMPH NODE DISSECTION N/A  04/13/2018   Procedure: XI ROBOTIC PELVIC LYMPHADECTOMY AND PARA-AORTIC LYMPH NODE DISSECTION;  Surgeon: Everitt Amber, MD;  Location: [facility] ORS;  Service: Gynecology;  Laterality: N/A;     OB History    Gravida  4   Para  3   Term  3   Preterm  0   AB  1   Living  3     SAB  0   TAB  1   Ectopic  0   Multiple  0   Live Births  3           Family History  Problem Relation Age of Onset  . Hypertension Father   . Heart disease Father   . Cancer Father        lung  . Hypertension Mother     Social History   Tobacco Use  . Smoking status: Never Smoker  . Smokeless tobacco: Never Used  Substance Use Topics  . Alcohol use: Yes    Alcohol/week: 4.0 standard drinks    Types: 4 Glasses of wine per week    Comment: weekends  . Drug use: Never    Home Medications Prior to Admission medications   Medication Sig Start Date End Date Taking? Authorizing Provider  amLODipine (NORVASC) 5 MG tablet Take 1 tablet (5 mg total) by mouth daily. 04/12/19   Soyla Dryer, PA-C  atorvastatin (LIPITOR) 20 MG tablet Take 1  tablet (20 mg total) by mouth daily. 03/09/19   Soyla Dryer, PA-C  CARBOPLATIN IV Inject into the vein every 21 ( twenty-one) days. 12/29/18   [provider]  dicyclomine (BENTYL) 20 MG tablet Take 1 tablet (20 mg total) by mouth every 6 (six) hours. 02/09/19   [name], MD  ibuprofen (ADVIL) 200 MG tablet Take 200 mg by mouth every 6 (six) hours as needed.    [provider]  lidocaine-prilocaine (EMLA) cream Apply small amount to port a cath site and cover with plastic wrap one hour prior to chemotherapy appointments 02/09/19   [name], MD  Multiple Vitamin (MULTIVITAMIN) tablet Take 1 tablet by mouth daily.    [provider]  oxyCODONE-acetaminophen (PERCOCET) 5-325 MG tablet Take 1 tablet by mouth every 8 (eight) hours as needed (pain). 03/22/19   [name], MD  PACLITAXEL IV Inject into the vein  every 21 ( twenty-one) days. 12/29/18   [provider]  phenazopyridine (PYRIDIUM) 100 MG tablet Take 1 tablet (100 mg total) by mouth 2 (two) times daily as needed for pain. 03/18/19   Lockamy, Randi L, NP-C  polyethylene glycol (MIRALAX / GLYCOLAX) 17 g packet Take 17 g by mouth 2 (two) times daily as needed.    [provider]  prochlorperazine (COMPAZINE) 10 MG tablet Take 1 tablet (10 mg total) by mouth every 6 (six) hours as needed (Nausea or vomiting). 02/09/19   [name], MD    Allergies    Patient has no known allergies.  Review of Systems   Review of Systems  Constitutional: Negative for chills and fever.  Musculoskeletal: Positive for arthralgias (ttp of the toes of the right foot, greatest at the second toe.  mild edema of the second toe.  nail is intact. no tenderness proximal to the base of the toe.  ) and joint swelling.  Skin: Negative for color change and wound.  Neurological: Negative for weakness.    Physical Exam Updated Vital Signs BP (!) 170/91 (BP Location: Left Wrist)   Pulse 92   Temp 98.3 F (36.8 C) (Oral)   Resp 18   Ht 5\' 4"  (1.626 m)   Wt 96.2 kg   SpO2 100%   BMI 36.39 kg/m   Physical Exam Vitals and nursing note reviewed.  Constitutional:      General: She is not in acute distress.    Appearance: Normal appearance. She is well-developed.  HENT:     Head: Atraumatic.  Cardiovascular:     Rate and Rhythm: Normal rate and regular rhythm.     Pulses: Normal pulses.  Pulmonary:     Effort: Pulmonary effort is normal.     Breath sounds: Normal breath sounds.  Musculoskeletal:        General: Swelling, tenderness and signs of injury present. No deformity.     Cervical back: Normal range of motion. No tenderness.     Comments: ttp and mild ecchymosis of the right second toe.  Mild edema also noted.  Metatarsals are non-tender.  Ankle also non-tender.  pateint is wearing nail polish.  Nail is intact.    Skin:     General: Skin is warm and dry.     Capillary Refill: Capillary refill takes less than 2 seconds.  Neurological:     General: No focal deficit present.     Mental Status: She is alert.     Sensory: No sensory deficit.     Motor: No weakness or abnormal  muscle tone.     Coordination: Coordination normal.     ED Results / Procedures / Treatments   Labs (all labs ordered are listed, but only abnormal results are displayed) Labs Reviewed - No data to display  EKG None  Radiology DG Foot Complete Right  Result Date: 04/18/2019 CLINICAL DATA:  Second toe pain after crush injury today. EXAM: RIGHT FOOT COMPLETE - 3+ VIEW COMPARISON:  None. FINDINGS: The bones appear adequately mineralized. There is an acute nondisplaced fracture of the distal 2nd phalanx. This may demonstrate minimal intra-articular extension medially. There is no dislocation. No other fractures are identified. There is no evidence of foreign body. Mild midfoot degenerative changes and small plantar calcaneal spurs are noted. IMPRESSION: Nondisplaced fracture of the distal 2nd phalanx. Electronically Signed   By: Richardean Sale M.D.   On: 04/18/2019 16:57    Procedures Procedures (including critical care time)  Medications Ordered in ED Medications - No data to display  ED Course  I have reviewed the triage vital signs and the nursing notes.  Pertinent labs & imaging results that were available during my care of the patient were reviewed by me and considered in my medical decision making (see chart for details).    MDM Rules/Calculators/A&P                      Patient here with crush injury to the toes of the right foot.  She has a nondisplaced fracture of the distal phalanx of the right second toe.  She is wearing nail polish to her toes, so I will remove nail polish to visualize if subungual hematoma is present.  Nail appears intact.  Nail visualized, no subungual hematoma   buddy taped toes and post op shoe  applied,  patient agrees to follow-up with orthopedics   Final Clinical Impression(s) / ED Diagnoses Final diagnoses:  Closed nondisplaced fracture of distal phalanx of lesser toe of right foot, initial encounter    Rx / DC Orders ED Discharge Orders    None       Bufford Lope 04/19/19 1126    Milton Ferguson, MD 04/19/19 2303

## 2019-04-25 ENCOUNTER — Ambulatory Visit (HOSPITAL_COMMUNITY): Payer: Medicaid Other

## 2019-04-26 ENCOUNTER — Other Ambulatory Visit (HOSPITAL_COMMUNITY): Payer: Self-pay | Admitting: Nurse Practitioner

## 2019-04-27 ENCOUNTER — Other Ambulatory Visit (HOSPITAL_COMMUNITY): Payer: Self-pay | Admitting: Nurse Practitioner

## 2019-05-02 ENCOUNTER — Other Ambulatory Visit: Payer: Self-pay

## 2019-05-02 ENCOUNTER — Ambulatory Visit (HOSPITAL_COMMUNITY)
Admission: RE | Admit: 2019-05-02 | Discharge: 2019-05-02 | Disposition: A | Discharge status: Home / Self Care | Payer: Medicaid Other | Source: Ambulatory Visit | Attending: Hematology | Admitting: Hematology

## 2019-05-02 ENCOUNTER — Ambulatory Visit (HOSPITAL_COMMUNITY)
Admission: RE | Admit: 2019-05-02 | Discharge: 2019-05-02 | Disposition: A | Discharge status: Home / Self Care | Payer: Medicaid Other | Source: Ambulatory Visit | Attending: Physician Assistant | Admitting: Physician Assistant

## 2019-05-02 DIAGNOSIS — C541 Malignant neoplasm of endometrium: Secondary | ICD-10-CM | POA: Insufficient documentation

## 2019-05-02 DIAGNOSIS — Z1239 Encounter for other screening for malignant neoplasm of breast: Secondary | ICD-10-CM

## 2019-05-02 MED ORDER — IOHEXOL 300 MG/ML  SOLN
100.0000 mL | Freq: Once | INTRAMUSCULAR | Status: AC | PRN
  Administered 2019-05-02: 100 mL via INTRAVENOUS

## 2019-05-03 ENCOUNTER — Encounter: Payer: Self-pay | Admitting: Pharmacy Technician

## 2019-05-03 NOTE — Progress Notes (Signed)
Patient no longer getting Udenyca from [place] based on Medicaid coverage. Last DOS covered is 04/15/19.

## 2019-05-04 ENCOUNTER — Inpatient Hospital Stay (HOSPITAL_COMMUNITY): Payer: Medicaid Other

## 2019-05-04 ENCOUNTER — Encounter (HOSPITAL_COMMUNITY): Payer: Self-pay | Admitting: Hematology

## 2019-05-04 ENCOUNTER — Other Ambulatory Visit: Payer: Self-pay

## 2019-05-04 ENCOUNTER — Inpatient Hospital Stay (HOSPITAL_COMMUNITY): Payer: Medicaid Other | Attending: Hematology

## 2019-05-04 ENCOUNTER — Other Ambulatory Visit (HOSPITAL_COMMUNITY): Payer: Self-pay | Admitting: Physician Assistant

## 2019-05-04 ENCOUNTER — Inpatient Hospital Stay (HOSPITAL_BASED_OUTPATIENT_CLINIC_OR_DEPARTMENT_OTHER): Payer: Medicaid Other | Admitting: Hematology

## 2019-05-04 VITALS — BP 106/56 | HR 64 | Temp 97.5°F | Resp 18

## 2019-05-04 DIAGNOSIS — Z9079 Acquired absence of other genital organ(s): Secondary | ICD-10-CM | POA: Diagnosis not present

## 2019-05-04 DIAGNOSIS — Z5189 Encounter for other specified aftercare: Secondary | ICD-10-CM | POA: Diagnosis not present

## 2019-05-04 DIAGNOSIS — C541 Malignant neoplasm of endometrium: Secondary | ICD-10-CM

## 2019-05-04 DIAGNOSIS — R109 Unspecified abdominal pain: Secondary | ICD-10-CM | POA: Diagnosis not present

## 2019-05-04 DIAGNOSIS — Z90710 Acquired absence of both cervix and uterus: Secondary | ICD-10-CM | POA: Insufficient documentation

## 2019-05-04 DIAGNOSIS — Z801 Family history of malignant neoplasm of trachea, bronchus and lung: Secondary | ICD-10-CM | POA: Insufficient documentation

## 2019-05-04 DIAGNOSIS — Z791 Long term (current) use of non-steroidal anti-inflammatories (NSAID): Secondary | ICD-10-CM | POA: Insufficient documentation

## 2019-05-04 DIAGNOSIS — Z95828 Presence of other vascular implants and grafts: Secondary | ICD-10-CM

## 2019-05-04 DIAGNOSIS — Z90722 Acquired absence of ovaries, bilateral: Secondary | ICD-10-CM | POA: Diagnosis not present

## 2019-05-04 DIAGNOSIS — Z79899 Other long term (current) drug therapy: Secondary | ICD-10-CM | POA: Insufficient documentation

## 2019-05-04 DIAGNOSIS — R928 Other abnormal and inconclusive findings on diagnostic imaging of breast: Secondary | ICD-10-CM

## 2019-05-04 DIAGNOSIS — Z8249 Family history of ischemic heart disease and other diseases of the circulatory system: Secondary | ICD-10-CM | POA: Diagnosis not present

## 2019-05-04 DIAGNOSIS — Z5111 Encounter for antineoplastic chemotherapy: Secondary | ICD-10-CM | POA: Diagnosis present

## 2019-05-04 LAB — CBC WITH DIFFERENTIAL/PLATELET
Abs Immature Granulocytes: 0.01 10*3/uL (ref 0.00–0.07)
Basophils Absolute: 0 10*3/uL (ref 0.0–0.1)
Basophils Relative: 1 %
Eosinophils Absolute: 0.1 10*3/uL (ref 0.0–0.5)
Eosinophils Relative: 1 %
HCT: 31 % — ABNORMAL LOW (ref 36.0–46.0)
Hemoglobin: 10.3 g/dL — ABNORMAL LOW (ref 12.0–15.0)
Immature Granulocytes: 0 %
Lymphocytes Relative: 32 %
Lymphs Abs: 2 10*3/uL (ref 0.7–4.0)
MCH: 32.4 pg (ref 26.0–34.0)
MCHC: 33.2 g/dL (ref 30.0–36.0)
MCV: 97.5 fL (ref 80.0–100.0)
Monocytes Absolute: 0.8 10*3/uL (ref 0.1–1.0)
Monocytes Relative: 13 %
Neutro Abs: 3.3 10*3/uL (ref 1.7–7.7)
Neutrophils Relative %: 53 %
Platelets: 236 10*3/uL (ref 150–400)
RBC: 3.18 MIL/uL — ABNORMAL LOW (ref 3.87–5.11)
RDW: 18.3 % — ABNORMAL HIGH (ref 11.5–15.5)
WBC: 6.1 10*3/uL (ref 4.0–10.5)
nRBC: 0 % (ref 0.0–0.2)

## 2019-05-04 LAB — COMPREHENSIVE METABOLIC PANEL
ALT: 27 U/L (ref 0–44)
AST: 23 U/L (ref 15–41)
Albumin: 4.2 g/dL (ref 3.5–5.0)
Alkaline Phosphatase: 114 U/L (ref 38–126)
Anion gap: 10 (ref 5–15)
BUN: 20 mg/dL (ref 6–20)
CO2: 24 mmol/L (ref 22–32)
Calcium: 9.2 mg/dL (ref 8.9–10.3)
Chloride: 104 mmol/L (ref 98–111)
Creatinine, Ser: 0.54 mg/dL (ref 0.44–1.00)
GFR calc Af Amer: >60 mL/min (ref >60)
GFR calc non Af Amer: >60 mL/min (ref >60)
Glucose, Bld: 106 mg/dL — ABNORMAL HIGH (ref 70–99)
Potassium: 3.9 mmol/L (ref 3.5–5.1)
Sodium: 138 mmol/L (ref 135–145)
Total Bilirubin: 0.5 mg/dL (ref 0.3–1.2)
Total Protein: 7 g/dL (ref 6.5–8.1)

## 2019-05-04 MED ORDER — SODIUM CHLORIDE 0.9 % IV SOLN
150.0000 mg | Freq: Once | INTRAVENOUS | Status: AC
  Administered 2019-05-04: 150 mg via INTRAVENOUS
  Filled 2019-05-04: qty 150

## 2019-05-04 MED ORDER — SODIUM CHLORIDE 0.9 % IV SOLN
10.0000 mg | Freq: Once | INTRAVENOUS | Status: AC
  Administered 2019-05-04: 10 mg via INTRAVENOUS
  Filled 2019-05-04: qty 10

## 2019-05-04 MED ORDER — DIPHENHYDRAMINE HCL 50 MG/ML IJ SOLN
50.0000 mg | Freq: Once | INTRAMUSCULAR | Status: AC
  Administered 2019-05-04: 50 mg via INTRAVENOUS
  Filled 2019-05-04: qty 1

## 2019-05-04 MED ORDER — FAMOTIDINE IN NACL 20-0.9 MG/50ML-% IV SOLN
20.0000 mg | Freq: Once | INTRAVENOUS | Status: AC
  Administered 2019-05-04: 20 mg via INTRAVENOUS
  Filled 2019-05-04: qty 50

## 2019-05-04 MED ORDER — SODIUM CHLORIDE 0.9 % IV SOLN
862.8000 mg | Freq: Once | INTRAVENOUS | Status: AC
  Administered 2019-05-04: 860 mg via INTRAVENOUS
  Filled 2019-05-04: qty 86

## 2019-05-04 MED ORDER — PALONOSETRON HCL INJECTION 0.25 MG/5ML
0.2500 mg | Freq: Once | INTRAVENOUS | Status: AC
  Administered 2019-05-04: 0.25 mg via INTRAVENOUS
  Filled 2019-05-04: qty 5

## 2019-05-04 MED ORDER — SODIUM CHLORIDE 0.9 % IV SOLN
140.0000 mg/m2 | Freq: Once | INTRAVENOUS | Status: AC
  Administered 2019-05-04: 288 mg via INTRAVENOUS
  Filled 2019-05-04: qty 48

## 2019-05-04 MED ORDER — HEPARIN SOD (PORK) LOCK FLUSH 100 UNIT/ML IV SOLN
500.0000 [IU] | Freq: Once | INTRAVENOUS | Status: AC | PRN
  Administered 2019-05-04: 500 [IU]

## 2019-05-04 MED ORDER — SODIUM CHLORIDE 0.9% FLUSH
10.0000 mL | INTRAVENOUS | Status: DC | PRN
  Administered 2019-05-04: 10 mL

## 2019-05-04 MED ORDER — SODIUM CHLORIDE 0.9 % IV SOLN: Freq: Once | INTRAVENOUS | Status: AC

## 2019-05-04 NOTE — Assessment & Plan Note (Signed)
1.  Recurrent endometrial carcinosarcoma: -6 cycles of carboplatin and paclitaxel from 12/29/2018 through 03/16/2019. -We reviewed results of the CT scan dated 05/02/2019.  Peritoneal implants in the low central small bowel mesentery and left pelvic sidewall has decreased in size measuring 1.5 x 1.9 cm, previously 2.6 x 2.6 cm and 1.7 x 2.2 cm previously 3.1 x 3.4 cm respectively.  Soft tissue nodule in the left lateral omentum measures 1 x 1.7 cm, decreased from 1.7 x 1.8 cm.  No new evidence of metastatic disease. -As she still has persistent disease, I have recommended continuing treatment at this time.  Her CA-125 was normal. -I will make an appointment for her to see Dr. Denman George. -She reported pain in her hands and right forearm since last treatment.  I will cut back on the dose of paclitaxel today. -We will see her back in 3 weeks for follow-up.  2.  Elevated LFTs: -Her ALT was elevated previously.  It has normalized today.  Normal bilirubin.  3.  Leg pains: -She is taking Percocet twice daily as needed during the first week when she gets leg pains after each treatment.  4.  Nausea: -She has nausea for 1 week without any vomiting.  She takes Compazine which is helping.

## 2019-05-04 NOTE — Progress Notes (Signed)
Patient has been assessed, vital signs and labs have been reviewed by Dr. Delton Coombes. ANC, Creatinine, LFTs, and Platelets are within treatment parameters per Dr. Delton Coombes. The patient is good to proceed with treatment at this time. He will dose reduce treatment today by 20% per Dr. Delton Coombes.

## 2019-05-04 NOTE — Progress Notes (Signed)
Labs reviewed today by MD. Will proceed with treatment today with a dose reduction of 20 % per MD.   Treatment given per orders. Patient tolerated it well without problems. Vitals stable and discharged home from clinic ambulatory. Follow up as scheduled.

## 2019-05-04 NOTE — Progress Notes (Signed)
Jessica [name],  [id number]   CLINIC:  Medical Oncology/Hematology  PCP:  Soyla Dryer, PA-C [place] [address] [phone]   REASON FOR VISIT:  Follow-up for recurrent endometrial carcinosarcoma.  CURRENT THERAPY: Carboplatin and paclitaxel every 3 weeks.  BRIEF ONCOLOGIC HISTORY:  Oncology History  Endometrial cancer ([place])  03/31/2018 Initial Diagnosis   Endometrial cancer ([place])   12/29/2018 -  Chemotherapy   The patient had palonosetron (ALOXI) injection 0.25 mg, 0.25 mg, Intravenous,  Once, 7 of 9 cycles Administration: 0.25 mg (12/29/2018), 0.25 mg (01/19/2019), 0.25 mg (02/09/2019), 0.25 mg (03/02/2019), 0.25 mg (03/23/2019), 0.25 mg (04/13/2019), 0.25 mg (05/04/2019) pegfilgrastim-cbqv (UDENYCA) injection 6 mg, 6 mg, Subcutaneous, Once, 7 of 9 cycles Administration: 6 mg (12/31/2018), 6 mg (01/20/2019), 6 mg (02/11/2019), 6 mg (03/04/2019), 6 mg (03/25/2019), 6 mg (04/15/2019) CARBOplatin (PARAPLATIN) 860 mg in sodium chloride 0.9 % 500 mL chemo infusion, 860 mg (100 % of original dose 862.8 mg), Intravenous,  Once, 7 of 9 cycles Dose modification:   (original dose 862.8 mg, Cycle 1),   (original dose 862.8 mg, Cycle 2),   (original dose 862.8 mg, Cycle 3),   (original dose 862.8 mg, Cycle 4),   (original dose 862.8 mg, Cycle 5),   (original dose 862.8 mg, Cycle 6) Administration: 860 mg (12/29/2018), 860 mg (01/19/2019), 860 mg (02/09/2019), 860 mg (03/02/2019), 860 mg (03/23/2019), 860 mg (04/13/2019), 860 mg (05/04/2019) fosaprepitant (EMEND) 150 mg in sodium chloride 0.9 % 145 mL IVPB, 150 mg, Intravenous,  Once, 7 of 9 cycles Administration: 150 mg (01/19/2019), 150 mg (02/09/2019), 150 mg (03/02/2019), 150 mg (03/23/2019), 150 mg (04/13/2019), 150 mg (05/04/2019) PACLitaxel (TAXOL) 360 mg in sodium chloride 0.9 % 500 mL chemo infusion (> 80mg /m2), 175 mg/m2 = 360 mg, Intravenous,  Once, 7 of 9 cycles Dose modification: 140 mg/m2 (80  % of original dose 175 mg/m2, Cycle 7, Reason: Other (see comments), Comment: hand pains) Administration: 360 mg (12/29/2018), 360 mg (01/19/2019), 360 mg (02/09/2019), 360 mg (03/02/2019), 360 mg (03/23/2019), 360 mg (04/13/2019), 288 mg (05/04/2019)  for chemotherapy treatment.       CANCER STAGING: Cancer Staging No matching staging information was found for the patient.   INTERVAL HISTORY:  Jessica [name] 59 y.o. female seen for follow-up of endometrial carcinoma and toxicity assessment prior to cycle 7 of chemotherapy.  She had CT scan of the abdomen and pelvis done on 05/02/2019.  She reported pain in the hands and right forearm since last treatment.  Appetite is 100%.  Energy levels are 50%.  Reports some funny feeling in the toes.  Also has on and off right-sided abdominal pain, sharp in nature.  She went to the ER as she dropped a heavy weight on her right foot.  She was told to have a broken toe and is wearing a boot.  REVIEW OF SYSTEMS:  Review of Systems  Gastrointestinal: Positive for abdominal pain.  Neurological: Positive for numbness.  All other systems reviewed and are negative.    PAST MEDICAL/SURGICAL HISTORY:  Past Medical History:  Diagnosis Date  . Elevated blood pressure reading   . Endometrial cancer ([place])   . PMB (postmenopausal bleeding)   . Port-A-Cath in place 12/24/2018   Past Surgical History:  Procedure Laterality Date  . IR IMAGING GUIDED PORT INSERTION  12/28/2018  . IR RADIOLOGIST EVAL & MGMT  06/02/2018  . IR RADIOLOGIST EVAL & MGMT  06/16/2018  . NECK SURGERY  2000   spinal surgery , titanium rod in place   . ROBOTIC ASSISTED TOTAL HYSTERECTOMY WITH BILATERAL SALPINGO OOPHERECTOMY N/A 04/13/2018   Procedure: XI ROBOTIC ASSISTED TOTAL HYSTERECTOMY WITH BILATERAL SALPINGO OOPHORECTOMY;  Surgeon: Everitt Amber, MD;  Location: [facility];  Service: Gynecology;  Laterality: N/A;  . ROBOTIC PELVIC AND PARA-AORTIC LYMPH NODE DISSECTION N/A 04/13/2018   Procedure: XI  ROBOTIC PELVIC LYMPHADECTOMY AND PARA-AORTIC LYMPH NODE DISSECTION;  Surgeon: Everitt Amber, MD;  Location: [facility];  Service: Gynecology;  Laterality: N/A;     SOCIAL HISTORY:  Social History   Socioeconomic History  . Marital status: Widowed    Spouse name: Not on file  . Number of children: Not on file  . Years of education: Not on file  . Highest education level: Not on file  Occupational History  . Not on file  Tobacco Use  . Smoking status: Never Smoker  . Smokeless tobacco: Never Used  Substance and Sexual Activity  . Alcohol use: Yes    Alcohol/week: 4.0 standard drinks    Types: 4 Glasses of wine per week    Comment: weekends  . Drug use: Never  . Sexual activity: Not Currently  Other Topics Concern  . Not on file  Social History Narrative  . Not on file   Social Determinants of Health   Financial Resource Strain:   . Difficulty of Paying Living Expenses:   Food Insecurity:   . Worried About Charity fundraiser in the Last Year:   . Arboriculturist in the Last Year:   Transportation Needs:   . Film/video editor (Medical):   Marland Kitchen Lack of Transportation (Non-Medical):   Physical Activity:   . Days of Exercise per Week:   . Minutes of Exercise per Session:   Stress:   . Feeling of Stress :   Social Connections:   . Frequency of Communication with Friends and Family:   . Frequency of Social Gatherings with Friends and Family:   . Attends Religious Services:   . Active Member of Clubs or Organizations:   . Attends Archivist Meetings:   Marland Kitchen Marital Status:   Intimate Partner Violence:   . Fear of Current or Ex-Partner:   . Emotionally Abused:   Marland Kitchen Physically Abused:   . Sexually Abused:     FAMILY HISTORY:  Family History  Problem Relation Age of Onset  . Hypertension Father   . Heart disease Father   . Cancer Father        lung  . Hypertension Mother     CURRENT MEDICATIONS:  Outpatient Encounter Medications as of 05/04/2019  Medication  Sig  . amLODipine (NORVASC) 5 MG tablet Take 1 tablet (5 mg total) by mouth daily.  Marland Kitchen atorvastatin (LIPITOR) 20 MG tablet Take 1 tablet (20 mg total) by mouth daily.  Marland Kitchen CARBOPLATIN IV Inject into the vein every 21 ( twenty-one) days.  Marland Kitchen dicyclomine (BENTYL) 20 MG tablet Take 1 tablet (20 mg total) by mouth every 6 (six) hours.  . lidocaine-prilocaine (EMLA) cream Apply small amount to port a cath site and cover with plastic wrap one hour prior to chemotherapy appointments  . Multiple Vitamin (MULTIVITAMIN) tablet Take 1 tablet by mouth daily.  Marland Kitchen PACLITAXEL IV Inject into the vein every 21 ( twenty-one) days.  . CONSTULOSE 10 GM/15ML solution Take 30 g by mouth daily as needed.   Marland Kitchen ibuprofen (ADVIL) 200 MG tablet Take 200 mg by mouth every 6 (  six) hours as needed.  Marland Kitchen oxyCODONE-acetaminophen (PERCOCET) 5-325 MG tablet Take 1 tablet by mouth every 8 (eight) hours as needed (pain). (Patient not taking: Reported on 05/04/2019)  . phenazopyridine (PYRIDIUM) 100 MG tablet Take 1 tablet (100 mg total) by mouth 2 (two) times daily as needed for pain. (Patient not taking: Reported on 05/04/2019)  . polyethylene glycol (MIRALAX / GLYCOLAX) 17 g packet Take 17 g by mouth 2 (two) times daily as needed.  . prochlorperazine (COMPAZINE) 10 MG tablet Take 1 tablet (10 mg total) by mouth every 6 (six) hours as needed (Nausea or vomiting). (Patient not taking: Reported on 05/04/2019)   No facility-administered encounter medications on file as of 05/04/2019.    ALLERGIES:  No Known Allergies   PHYSICAL EXAM:  ECOG Performance status: 1  Vitals:   05/04/19 0857  BP: 120/76  Pulse: 66  Resp: 20  Temp: (!) 97 F (36.1 C)  SpO2: 100%   Filed Weights   05/04/19 0857  Weight: 219 lb 3.2 oz (99.4 kg)    Physical Exam Vitals reviewed.  Constitutional:      Appearance: Normal appearance.  Cardiovascular:     Rate and Rhythm: Normal rate and regular rhythm.     Heart sounds: Normal heart sounds.    Pulmonary:     Effort: Pulmonary effort is normal.     Breath sounds: Normal breath sounds.  Abdominal:     General: There is no distension.     Palpations: Abdomen is soft. There is no mass.  Skin:    General: Skin is warm.  Neurological:     General: No focal deficit present.     Mental Status: She is alert and oriented to person, place, and time.  Psychiatric:        Mood and Affect: Mood normal.        Behavior: Behavior normal.    Mild tenderness in the right lateral abdominal wall.  LABORATORY DATA:  I have reviewed the labs as listed.  CBC    Component Value Date/Time   WBC 6.1 05/04/2019 0855   RBC 3.18 (L) 05/04/2019 0855   HGB 10.3 (L) 05/04/2019 0855   HCT 31.0 (L) 05/04/2019 0855   PLT 236 05/04/2019 0855   MCV 97.5 05/04/2019 0855   MCH 32.4 05/04/2019 0855   MCHC 33.2 05/04/2019 0855   RDW 18.3 (H) 05/04/2019 0855   LYMPHSABS 2.0 05/04/2019 0855   MONOABS 0.8 05/04/2019 0855   EOSABS 0.1 05/04/2019 0855   BASOSABS 0.0 05/04/2019 0855   CMP Latest Ref Rng & Units 05/04/2019 04/13/2019 03/23/2019  Glucose 70 - 99 mg/dL 106(H) 100(H) 98  BUN 6 - 20 mg/dL 20 14 23(H)  Creatinine 0.44 - 1.00 mg/dL 0.54 0.45 0.44  Sodium 135 - 145 mmol/L 138 141 139  Potassium 3.5 - 5.1 mmol/L 3.9 3.6 3.8  Chloride 98 - 111 mmol/L 104 107 106  CO2 22 - 32 mmol/L 24 25 26   Calcium 8.9 - 10.3 mg/dL 9.2 9.0 8.9  Total Protein 6.5 - 8.1 g/dL 7.0 6.7 6.8  Total Bilirubin 0.3 - 1.2 mg/dL 0.5 0.3 0.3  Alkaline Phos 38 - 126 U/L 114 122 131(H)  AST 15 - 41 U/L 23 20 38  ALT 0 - 44 U/L 27 28 52(H)       DIAGNOSTIC IMAGING:  I have independently reviewed the scans and discussed with the patient.   ASSESSMENT & PLAN:   Endometrial cancer ([place]) 1.  Recurrent endometrial  carcinosarcoma: -6 cycles of carboplatin and paclitaxel from 12/29/2018 through 03/16/2019. -We reviewed results of the CT scan dated 05/02/2019.  Peritoneal implants in the low central small bowel mesentery and  left pelvic sidewall has decreased in size measuring 1.5 x 1.9 cm, previously 2.6 x 2.6 cm and 1.7 x 2.2 cm previously 3.1 x 3.4 cm respectively.  Soft tissue nodule in the left lateral omentum measures 1 x 1.7 cm, decreased from 1.7 x 1.8 cm.  No new evidence of metastatic disease. -As she still has persistent disease, I have recommended continuing treatment at this time.  Her CA-125 was normal. -I will make an appointment for her to see Dr. Denman George. -She reported pain in her hands and right forearm since last treatment.  I will cut back on the dose of paclitaxel today. -We will see her back in 3 weeks for follow-up.  2.  Elevated LFTs: -Her ALT was elevated previously.  It has normalized today.  Normal bilirubin.  3.  Leg pains: -She is taking Percocet twice daily as needed during the first week when she gets leg pains after each treatment.  4.  Nausea: -She has nausea for 1 week without any vomiting.  She takes Compazine which is helping.      Orders placed this encounter:  No orders of the defined types were placed in this encounter.     [name], MD [place] [phone]

## 2019-05-04 NOTE — Patient Instructions (Signed)
[facility] Discharge Instructions for Patients Receiving Chemotherapy  Today you received the following chemotherapy agents   To help prevent nausea and vomiting after your treatment, we encourage you to take your nausea medication   If you develop nausea and vomiting that is not controlled by your nausea medication, call the clinic.   BELOW ARE SYMPTOMS THAT SHOULD BE REPORTED IMMEDIATELY:  *FEVER GREATER THAN 100.5 F  *CHILLS WITH OR WITHOUT FEVER  NAUSEA AND VOMITING THAT IS NOT CONTROLLED WITH YOUR NAUSEA MEDICATION  *UNUSUAL SHORTNESS OF BREATH  *UNUSUAL BRUISING OR BLEEDING  TENDERNESS IN MOUTH AND THROAT WITH OR WITHOUT PRESENCE OF ULCERS  *URINARY PROBLEMS  *BOWEL PROBLEMS  UNUSUAL RASH Items with * indicate a potential emergency and should be followed up as soon as possible.  Feel free to call the clinic should you have any questions or concerns. The clinic phone number is [phone].  Please show the CHEMO ALERT CARD at check-in to the Emergency Department and triage nurse.   

## 2019-05-04 NOTE — Patient Instructions (Addendum)
[place] at [facility] Discharge Instructions  You were seen today by Dr. Delton Coombes. He went over your recent lab results. He will refer you back to Dr. Denman George for follow up. He will see you back in 3 weeks for labs, treatment and follow up.   Thank you for choosing [place] at [facility] to provide your oncology and hematology care.  To afford each patient quality time with our provider, please arrive at least 15 minutes before your scheduled appointment time.   If you have a lab appointment with the [facility] please come in thru the  Main Entrance and check in at the main information desk  You need to re-schedule your appointment should you arrive 10 or more minutes late.  We strive to give you quality time with our providers, and arriving late affects you and other patients whose appointments are after yours.  Also, if you no show three or more times for appointments you may be dismissed from the clinic at the providers discretion.     Again, thank you for choosing [facility].  Our hope is that these requests will decrease the amount of time that you wait before being seen by our physicians.       _____________________________________________________________  Should you have questions after your visit to [facility], please contact our office at [phone] between the hours of 8:00 a.m. and 4:30 p.m.  Voicemails left after 4:00 p.m. will not be returned until the following business day.  For prescription refill requests, have your pharmacy contact our office and allow 72 hours.    Cancer Center Support Programs:   > Cancer Support Group  2nd Tuesday of the month 1pm-2pm, Journey Room

## 2019-05-05 ENCOUNTER — Other Ambulatory Visit (HOSPITAL_COMMUNITY): Payer: Self-pay | Admitting: Nurse Practitioner

## 2019-05-05 DIAGNOSIS — R928 Other abnormal and inconclusive findings on diagnostic imaging of breast: Secondary | ICD-10-CM

## 2019-05-05 LAB — CA 125: Cancer Antigen (CA) 125: 5.7 U/mL (ref 0.0–38.1)

## 2019-05-06 ENCOUNTER — Other Ambulatory Visit: Payer: Self-pay

## 2019-05-06 ENCOUNTER — Inpatient Hospital Stay (HOSPITAL_COMMUNITY): Payer: Medicaid Other

## 2019-05-06 ENCOUNTER — Encounter (HOSPITAL_COMMUNITY): Payer: Self-pay

## 2019-05-06 VITALS — BP 112/76 | HR 63 | Temp 96.9°F | Resp 17

## 2019-05-06 DIAGNOSIS — C541 Malignant neoplasm of endometrium: Secondary | ICD-10-CM

## 2019-05-06 DIAGNOSIS — Z95828 Presence of other vascular implants and grafts: Secondary | ICD-10-CM

## 2019-05-06 DIAGNOSIS — Z5111 Encounter for antineoplastic chemotherapy: Secondary | ICD-10-CM | POA: Diagnosis not present

## 2019-05-06 MED ORDER — PEGFILGRASTIM-CBQV 6 MG/0.6ML ~~LOC~~ SOSY
PREFILLED_SYRINGE | SUBCUTANEOUS | Status: AC
  Filled 2019-05-06: qty 0.6

## 2019-05-06 MED ORDER — PEGFILGRASTIM-CBQV 6 MG/0.6ML ~~LOC~~ SOSY
6.0000 mg | PREFILLED_SYRINGE | Freq: Once | SUBCUTANEOUS | Status: AC
  Administered 2019-05-06: 6 mg via SUBCUTANEOUS

## 2019-05-06 NOTE — Patient Instructions (Signed)
[facility] at [facility]  Discharge Instructions:   _______________________________________________________________  Thank you for choosing [facility] at [facility] to provide your oncology and hematology care.  To afford each patient quality time with our providers, please arrive at least 15 minutes before your scheduled appointment.  You need to re-schedule your appointment if you arrive 10 or more minutes late.  We strive to give you quality time with our providers, and arriving late affects you and other patients whose appointments are after yours.  Also, if you no show three or more times for appointments you may be dismissed from the clinic.  Again, thank you for choosing  Cancer Center at [facility]. Our hope is that these requests will allow you access to exceptional care and in a timely manner. _______________________________________________________________  If you have questions after your visit, please contact our office at [phone] between the hours of 8:30 a.m. and 5:00 p.m. Voicemails left after 4:30 p.m. will not be returned until the following business day. _______________________________________________________________  For prescription refill requests, have your pharmacy contact our office. _______________________________________________________________  Recommendations made by the consultant and any test results will be sent to your referring physician. _______________________________________________________________ 

## 2019-05-06 NOTE — Progress Notes (Signed)
Jessica [name] presents today for injection per MD orders. Udenyca administered SQ in right Upper Arm. Administration without incident. Patient tolerated well.  No complaints at this time. Discharged from clinic ambulatory. F/U with [facility] as scheduled.

## 2019-05-12 ENCOUNTER — Telehealth: Payer: Self-pay | Admitting: *Deleted

## 2019-05-12 NOTE — Telephone Encounter (Signed)
Called and moved the patient's appt moved the afternoon to the morning

## 2019-05-13 ENCOUNTER — Inpatient Hospital Stay: Payer: Medicaid Other | Attending: Gynecologic Oncology | Admitting: Gynecologic Oncology

## 2019-05-13 ENCOUNTER — Encounter: Payer: Self-pay | Admitting: Oncology

## 2019-05-13 ENCOUNTER — Other Ambulatory Visit: Payer: Self-pay

## 2019-05-13 ENCOUNTER — Encounter: Payer: Self-pay | Admitting: Gynecologic Oncology

## 2019-05-13 VITALS — BP 122/56 | HR 67 | Temp 98.5°F | Resp 17 | Ht 64.0 in | Wt 223.8 lb

## 2019-05-13 DIAGNOSIS — C541 Malignant neoplasm of endometrium: Secondary | ICD-10-CM

## 2019-05-13 DIAGNOSIS — Z5111 Encounter for antineoplastic chemotherapy: Secondary | ICD-10-CM | POA: Diagnosis not present

## 2019-05-13 NOTE — Patient Instructions (Signed)
Dr Denman George recommends continuing an additional 3 doses of chemotherapy and then undergoing a repeat scan. If this shows complete response (no measurable disease), it would be reasonable to stop chemotherapy at that time.  If the cancer were to regrow again, you may be a candidate for a clinical trial to evaluate a new treatment for cancer.

## 2019-05-13 NOTE — Progress Notes (Signed)
Requested Her-2 testing on accession [id number] with [facility] via email.

## 2019-05-13 NOTE — Progress Notes (Signed)
Gynecologic Oncology Follow-up  Chief Complaint:  Chief Complaint  Patient presents with  . Endometrial cancer Jessica [name])    Follow up    Assessment/Plan:  Jessica [name]  is a 59 y.o. 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  She has had good partial response to 7 cycles of salvage carb/taxol. I recommend an additional 2 cycles prior to repeating CT imaging. I recommend continuing with chemotherapy until imaging shows no measurable disease (repeating scans every 3-4 cycles while on chemotherapy).  We discussed that if she has a complete clinical response, there is still a very high likelihood for relapse of the tumor. We discussed that surgery is not recommended for disseminated, recurrent endometrial cancer (eg no role for "debulking" in the recurrent setting, particularly in high grade cancer).  We will test her tumor for Her-2 to see if Herceptin is an option for her.  If she were to progress/recur after carb/taxol, she may be a candidate for either an ifos containing regimen or lenvatinib/pembrolizumab doublet therapy. Or, alternatively, a clinical trial.   Jessica [name] will return to see me 3 months after completing chemotherapy for surveillance/survivorship [name].   HPI: Jessica [name] is a 59 year old P3 who was initially seen in consultation at the request of Dr Kennon Rounds for uterine carcinosarcoma in March, 2020.  The patient reported a 46-monthhistory of vaginal spotting which became more consistent with menstrual-like bleeding in January 2020.  [facility] which revealed a cervical mass, on March 24, 2018.  She then had a transvaginal ultrasound scan on March 24, 2018 which revealed a uterus measuring 7.1 x 3.3 x 4.9 cm with no gross masses within the uterus  identified, the endometrium was thickened measuring 14 mm but with no focal abnormality, the right and left ovaries were not visualized, there was a trace amount of free pelvic fluid.  Due to the finding of a thickened endometrium and the cervical mass on examination she was sent for evaluation by Dr. PKennon Rounds  On examination on March 29, 2026 Dr. PKennon Roundsidentified that the entire cervix was replaced with a polypoid mass with multiple projections.  She took biopsies from this mass which revealed carcinosarcoma.  The patient did not seek medical [name] regularly and therefore did not carry formal medical diagnoses.  She was obese with a BMI of 38 kg/m.  Her father died from lung cancer but was a smoker.  She also has a maternal aunt who died from metastatic carcinoma but she is unknown of what primary diagnosis she had.  She had a history of 3 pregnancies which were all vaginal deliveries.  She works in [employer]  On 04/13/18 she underwent robotic assisted total hysterectomy, BSO, pelvic and para-aortic lymphadenectomy. Final pathology revealed a 5.2 cm polypoid carcinosarcoma tumor with no myometrial invasion, no LVSI, negative lymph nodes, cervix and adnexa. Due to the non-invasive nature of her disease, she was determined to have low risk factors for recurrence and in accordance with NCCN guidelines, no adjuvant therapy was recommended.   Postop she had rectal symptoms and was seen in the ED on 04/29/18 where a CT abd/pelvis was performed which showed no bowel obstruction, no abscess and no evidence of recurrent/persistent disease. .  She had began experiencing left lower extremity pain which was  worked up with doppler on 05/03/18 which was negative for DVT. She was empirically started on neurontin.  Her rectal pressure resolved after she had concerns in May, 2020 which were worked up with a CT scan which failed to show pathology.   She was seen by me in October 2020 and  had no symptoms concerning for recurrence.  Her physical exam was normal at that time.  She began experiencing pelvic pains and pressure and abdominal bloating in November 2020.  This exacerbated until she was seen in the emergency department at [facility] on December 09, 2018.  A CT scan of the abdomen and pelvis was ordered and revealed extensive peritoneal carcinomatosis with very aggressive appearing new soft tissue masses in the abdomen and pelvis.  There was a large conglomerate partially enhancing soft tissue mass in the pelvis measuring 12 x 12 x 10 cm concerning for recurrent endometrial cancer.  There was also a left-sided lower abdominal mass along the omentum measuring 6.5 x 6 x 4.8 cm.  There was small volume volume abdominal pelvic ascites.  None of this disease was seen on the prior CT scan from May 2020.  CT chest on December 27, 2018 revealed no metastatic disease in the chest.  Peritoneal biopsy on December 28, 2018 confirmed a gynecologic adenocarcinoma consistent with her initial disease, recurrence.  Interval Hx:  She commenced salvage carboplatin and paclitaxel chemotherapy with Dr. Delton Coombes at [facility] in [place] on December 29, 2018.  She completed 6 cycles which she reported she tolerated fairly well.  Her sixth cycle was completed in 04/13/19 after which a repeat CT scan of the chest abdomen and pelvis was performed on May 02, 2019.  This revealed interval response to therapy as evidenced by decrease in size of the peritoneal metastases the multiple peritoneal implants in the small bowel mesentery, left pelvic sidewall and omentum was still present.  The ascites has resolved.  There is no chest disease present.  She went on to receive cycle 7 of carboplatin paclitaxel on May 04, 2019.  Ca1 25 was elevated at 127 at commencement of treatment (December 22, 2018) but had normalized to 7 on March 02, 2019, and was persistently normal at 5.7 on  05/04/2019.  Current Meds:  Outpatient Encounter Medications as of 05/13/2019  Medication Sig  . amLODipine (NORVASC) 5 MG tablet Take 1 tablet (5 mg total) by mouth daily.  Marland Kitchen atorvastatin (LIPITOR) 20 MG tablet Take 1 tablet (20 mg total) by mouth daily.  Marland Kitchen CARBOPLATIN IV Inject into the vein every 21 ( twenty-one) days.  . CONSTULOSE 10 GM/15ML solution Take 30 g by mouth daily as needed.   . dicyclomine (BENTYL) 20 MG tablet Take 1 tablet (20 mg total) by mouth every 6 (six) hours.  Marland Kitchen ibuprofen (ADVIL) 200 MG tablet Take 200 mg by mouth every 6 (six) hours as needed.  . lidocaine-prilocaine (EMLA) cream Apply small amount to port a cath site and cover with plastic wrap one hour prior to chemotherapy appointments  . Multiple Vitamin (MULTIVITAMIN) tablet Take 1 tablet by mouth daily.  Marland Kitchen oxyCODONE-acetaminophen (PERCOCET) 5-325 MG tablet Take 1 tablet by mouth every 8 (eight) hours as needed (pain).  Marland Kitchen PACLITAXEL IV Inject into the vein every 21 ( twenty-one) days.  . phenazopyridine (PYRIDIUM) 100 MG tablet Take 1 tablet (100 mg total) by mouth 2 (two) times daily as needed for pain.  . polyethylene glycol (MIRALAX / GLYCOLAX) 17 g packet Take 17  g by mouth 2 (two) times daily as needed.  . prochlorperazine (COMPAZINE) 10 MG tablet Take 1 tablet (10 mg total) by mouth every 6 (six) hours as needed (Nausea or vomiting).   No facility-administered encounter medications on file as of 05/13/2019.    Allergy: No Known Allergies  Social Hx:   Social History   Socioeconomic History  . Marital status: Widowed    Spouse name: Not on file  . Number of children: Not on file  . Years of education: Not on file  . Highest education level: Not on file  Occupational History  . Not on file  Tobacco Use  . Smoking status: Never Smoker  . Smokeless tobacco: Never Used  Substance and Sexual Activity  . Alcohol use: Yes    Alcohol/week: 4.0 standard drinks    Types: 4 Glasses of wine per week     Comment: weekends  . Drug use: Never  . Sexual activity: Not Currently  Other Topics Concern  . Not on file  Social History Narrative  . Not on file   Social Determinants of Health   Financial Resource Strain:   . Difficulty of Paying Living Expenses:   Food Insecurity:   . Worried About Charity fundraiser in the Last Year:   . Arboriculturist in the Last Year:   Transportation Needs:   . Film/video editor (Medical):   Marland Kitchen Lack of Transportation (Non-Medical):   Physical Activity:   . Days of Exercise per Week:   . Minutes of Exercise per Session:   Stress:   . Feeling of Stress :   Social Connections:   . Frequency of Communication with Friends and Family:   . Frequency of Social Gatherings with Friends and Family:   . Attends Religious Services:   . Active Member of Clubs or Organizations:   . Attends Archivist Meetings:   Marland Kitchen Marital Status:   Intimate Partner Violence:   . Fear of Current or Ex-Partner:   . Emotionally Abused:   Marland Kitchen Physically Abused:   . Sexually Abused:     Past Surgical Hx:  Past Surgical History:  Procedure Laterality Date  . IR IMAGING GUIDED PORT INSERTION  12/28/2018  . IR RADIOLOGIST EVAL & MGMT  06/02/2018  . IR RADIOLOGIST EVAL & MGMT  06/16/2018  . NECK SURGERY  2000   spinal surgery , titanium rod in place   . ROBOTIC ASSISTED TOTAL HYSTERECTOMY WITH BILATERAL SALPINGO OOPHERECTOMY N/A 04/13/2018   Procedure: XI ROBOTIC ASSISTED TOTAL HYSTERECTOMY WITH BILATERAL SALPINGO OOPHORECTOMY;  Surgeon: Everitt Amber, MD;  Location: [facility];  Service: Gynecology;  Laterality: N/A;  . ROBOTIC PELVIC AND PARA-AORTIC LYMPH NODE DISSECTION N/A 04/13/2018   Procedure: XI ROBOTIC PELVIC LYMPHADECTOMY AND PARA-AORTIC LYMPH NODE DISSECTION;  Surgeon: Everitt Amber, MD;  Location: [facility];  Service: Gynecology;  Laterality: N/A;    Past Medical Hx:  Past Medical History:  Diagnosis Date  . Elevated blood pressure reading   . Endometrial cancer  ([place])   . PMB (postmenopausal bleeding)   . Port-A-Cath in place 12/24/2018    Past Gynecological History:  P3 (svd's) No LMP recorded. Patient has had a hysterectomy.  Family Hx:  Family History  Problem Relation Age of Onset  . Hypertension Father   . Heart disease Father   . Cancer Father        lung  . Hypertension Mother     Review of Systems:  Constitutional  Feels well,   ENT Normal appearing ears and nares bilaterally Skin/Breast  No rash, sores, jaundice, itching, dryness Cardiovascular  No chest pain, shortness of breath, or edema  Pulmonary  No cough or wheeze.  Gastro Intestinal  No nausea, vomitting, or diarrhoea. No bright red blood per rectum, no abdominal pain, change in bowel movement, or constipation.  Genito Urinary  No frequency, urgency, dysuria, no bleeding Musculo Skeletal  + left thigh and leg pain (calf) Neurologic  No weakness, numbness, change in gait,  Psychology  No depression, anxiety, insomnia.   Vitals:  Blood pressure (!) 122/56, pulse 67, temperature 98.5 F (36.9 C), temperature source Temporal, resp. rate 17, height _0  (1.626 m), weight 223 lb 12.8 oz (101.5 kg), SpO2 100 %. Physical Exam: WD in NAD Neck  Supple NROM, without any enlargements.  Lymph Node Survey No cervical supraclavicular or inguinal adenopathy Cardiovascular  Pulse normal rate, regularity and rhythm. S1 and S2 normal.  Lungs  Clear to auscultation bilateraly, without wheezes/crackles/rhonchi. Good air movement.  Skin  No rash/lesions/breakdown  Psychiatry  Alert and oriented to person, place, and time  Abdomen  Normoactive bowel sounds, abdomen soft, non-tender and obese without evidence of hernia. Soft incisions Back No CVA tenderness Genito Urinary  Vulva/vagina: Normal external female genitalia.  No lesions. No discharge or bleeding.  Bladder/urethra:  No lesions or masses, well supported bladder  Vagina: smooth cuff, no lesions, no palpable  masses.  Adnexa: no palpable masses. Rectal  deferred Extremities  No bilateral cyanosis, clubbing or edema.  Thereasa Solo, MD  05/13/2019, 10:40 AM

## 2019-05-24 ENCOUNTER — Other Ambulatory Visit (HOSPITAL_COMMUNITY): Payer: Medicaid Other

## 2019-05-24 ENCOUNTER — Encounter (HOSPITAL_COMMUNITY): Payer: Medicaid Other

## 2019-05-25 ENCOUNTER — Inpatient Hospital Stay (HOSPITAL_BASED_OUTPATIENT_CLINIC_OR_DEPARTMENT_OTHER): Payer: Medicaid Other | Admitting: Hematology

## 2019-05-25 ENCOUNTER — Inpatient Hospital Stay (HOSPITAL_COMMUNITY): Payer: Medicaid Other | Attending: Hematology

## 2019-05-25 ENCOUNTER — Other Ambulatory Visit: Payer: Self-pay

## 2019-05-25 ENCOUNTER — Inpatient Hospital Stay (HOSPITAL_COMMUNITY): Payer: Medicaid Other

## 2019-05-25 ENCOUNTER — Encounter (HOSPITAL_COMMUNITY): Payer: Self-pay | Admitting: Hematology

## 2019-05-25 VITALS — BP 138/73 | HR 79 | Resp 16

## 2019-05-25 VITALS — BP 104/69 | HR 74 | Temp 96.0°F | Resp 18 | Wt 223.0 lb

## 2019-05-25 DIAGNOSIS — M79606 Pain in leg, unspecified: Secondary | ICD-10-CM | POA: Diagnosis not present

## 2019-05-25 DIAGNOSIS — Z5111 Encounter for antineoplastic chemotherapy: Secondary | ICD-10-CM | POA: Insufficient documentation

## 2019-05-25 DIAGNOSIS — C541 Malignant neoplasm of endometrium: Secondary | ICD-10-CM | POA: Insufficient documentation

## 2019-05-25 DIAGNOSIS — Z8249 Family history of ischemic heart disease and other diseases of the circulatory system: Secondary | ICD-10-CM | POA: Insufficient documentation

## 2019-05-25 DIAGNOSIS — Z90710 Acquired absence of both cervix and uterus: Secondary | ICD-10-CM | POA: Insufficient documentation

## 2019-05-25 DIAGNOSIS — Z9079 Acquired absence of other genital organ(s): Secondary | ICD-10-CM | POA: Diagnosis not present

## 2019-05-25 DIAGNOSIS — M7989 Other specified soft tissue disorders: Secondary | ICD-10-CM | POA: Diagnosis not present

## 2019-05-25 DIAGNOSIS — Z95828 Presence of other vascular implants and grafts: Secondary | ICD-10-CM

## 2019-05-25 DIAGNOSIS — I10 Essential (primary) hypertension: Secondary | ICD-10-CM | POA: Insufficient documentation

## 2019-05-25 DIAGNOSIS — Z79899 Other long term (current) drug therapy: Secondary | ICD-10-CM | POA: Insufficient documentation

## 2019-05-25 DIAGNOSIS — Z5189 Encounter for other specified aftercare: Secondary | ICD-10-CM | POA: Diagnosis not present

## 2019-05-25 DIAGNOSIS — C786 Secondary malignant neoplasm of retroperitoneum and peritoneum: Secondary | ICD-10-CM | POA: Insufficient documentation

## 2019-05-25 DIAGNOSIS — Z90722 Acquired absence of ovaries, bilateral: Secondary | ICD-10-CM | POA: Insufficient documentation

## 2019-05-25 DIAGNOSIS — Z801 Family history of malignant neoplasm of trachea, bronchus and lung: Secondary | ICD-10-CM | POA: Insufficient documentation

## 2019-05-25 DIAGNOSIS — Z791 Long term (current) use of non-steroidal anti-inflammatories (NSAID): Secondary | ICD-10-CM | POA: Insufficient documentation

## 2019-05-25 LAB — CBC WITH DIFFERENTIAL/PLATELET
Abs Immature Granulocytes: 0.01 10*3/uL (ref 0.00–0.07)
Basophils Absolute: 0 10*3/uL (ref 0.0–0.1)
Basophils Relative: 0 %
Eosinophils Absolute: 0 10*3/uL (ref 0.0–0.5)
Eosinophils Relative: 1 %
HCT: 31.8 % — ABNORMAL LOW (ref 36.0–46.0)
Hemoglobin: 10.6 g/dL — ABNORMAL LOW (ref 12.0–15.0)
Immature Granulocytes: 0 %
Lymphocytes Relative: 29 %
Lymphs Abs: 1.8 10*3/uL (ref 0.7–4.0)
MCH: 33.3 pg (ref 26.0–34.0)
MCHC: 33.3 g/dL (ref 30.0–36.0)
MCV: 100 fL (ref 80.0–100.0)
Monocytes Absolute: 0.5 10*3/uL (ref 0.1–1.0)
Monocytes Relative: 8 %
Neutro Abs: 3.8 10*3/uL (ref 1.7–7.7)
Neutrophils Relative %: 62 %
Platelets: 187 10*3/uL (ref 150–400)
RBC: 3.18 MIL/uL — ABNORMAL LOW (ref 3.87–5.11)
RDW: 15.7 % — ABNORMAL HIGH (ref 11.5–15.5)
WBC: 6.1 10*3/uL (ref 4.0–10.5)
nRBC: 0 % (ref 0.0–0.2)

## 2019-05-25 LAB — COMPREHENSIVE METABOLIC PANEL
ALT: 23 U/L (ref 0–44)
AST: 17 U/L (ref 15–41)
Albumin: 4.1 g/dL (ref 3.5–5.0)
Alkaline Phosphatase: 117 U/L (ref 38–126)
Anion gap: 7 (ref 5–15)
BUN: 20 mg/dL (ref 6–20)
CO2: 25 mmol/L (ref 22–32)
Calcium: 9.2 mg/dL (ref 8.9–10.3)
Chloride: 105 mmol/L (ref 98–111)
Creatinine, Ser: 0.44 mg/dL (ref 0.44–1.00)
GFR calc Af Amer: >60 mL/min (ref >60)
GFR calc non Af Amer: >60 mL/min (ref >60)
Glucose, Bld: 96 mg/dL (ref 70–99)
Potassium: 3.8 mmol/L (ref 3.5–5.1)
Sodium: 137 mmol/L (ref 135–145)
Total Bilirubin: 0.4 mg/dL (ref 0.3–1.2)
Total Protein: 7.3 g/dL (ref 6.5–8.1)

## 2019-05-25 MED ORDER — HEPARIN SOD (PORK) LOCK FLUSH 100 UNIT/ML IV SOLN
500.0000 [IU] | Freq: Once | INTRAVENOUS | Status: AC | PRN
  Administered 2019-05-25: 500 [IU]

## 2019-05-25 MED ORDER — DIPHENHYDRAMINE HCL 50 MG/ML IJ SOLN
50.0000 mg | Freq: Once | INTRAMUSCULAR | Status: AC
  Administered 2019-05-25: 50 mg via INTRAVENOUS

## 2019-05-25 MED ORDER — PALONOSETRON HCL INJECTION 0.25 MG/5ML
INTRAVENOUS | Status: AC
  Filled 2019-05-25: qty 5

## 2019-05-25 MED ORDER — FAMOTIDINE IN NACL 20-0.9 MG/50ML-% IV SOLN
20.0000 mg | Freq: Once | INTRAVENOUS | Status: AC
  Administered 2019-05-25: 20 mg via INTRAVENOUS

## 2019-05-25 MED ORDER — SODIUM CHLORIDE 0.9 % IV SOLN
140.0000 mg/m2 | Freq: Once | INTRAVENOUS | Status: AC
  Administered 2019-05-25: 288 mg via INTRAVENOUS
  Filled 2019-05-25: qty 48

## 2019-05-25 MED ORDER — SODIUM CHLORIDE 0.9% FLUSH
10.0000 mL | INTRAVENOUS | Status: DC | PRN
  Administered 2019-05-25: 10 mL

## 2019-05-25 MED ORDER — SODIUM CHLORIDE 0.9 % IV SOLN
10.0000 mg | Freq: Once | INTRAVENOUS | Status: AC
  Administered 2019-05-25: 10 mg via INTRAVENOUS
  Filled 2019-05-25: qty 10

## 2019-05-25 MED ORDER — SODIUM CHLORIDE 0.9 % IV SOLN
862.8000 mg | Freq: Once | INTRAVENOUS | Status: AC
  Administered 2019-05-25: 860 mg via INTRAVENOUS
  Filled 2019-05-25: qty 86

## 2019-05-25 MED ORDER — SODIUM CHLORIDE 0.9 % IV SOLN
150.0000 mg | Freq: Once | INTRAVENOUS | Status: AC
  Administered 2019-05-25: 11:00:00 150 mg via INTRAVENOUS
  Filled 2019-05-25: qty 150

## 2019-05-25 MED ORDER — SODIUM CHLORIDE 0.9 % IV SOLN: Freq: Once | INTRAVENOUS | Status: AC

## 2019-05-25 MED ORDER — PALONOSETRON HCL INJECTION 0.25 MG/5ML
0.2500 mg | Freq: Once | INTRAVENOUS | Status: AC
  Administered 2019-05-25: 0.25 mg via INTRAVENOUS

## 2019-05-25 MED ORDER — DIPHENHYDRAMINE HCL 50 MG/ML IJ SOLN
INTRAMUSCULAR | Status: AC
  Filled 2019-05-25: qty 1

## 2019-05-25 MED ORDER — FAMOTIDINE IN NACL 20-0.9 MG/50ML-% IV SOLN
INTRAVENOUS | Status: AC
  Filled 2019-05-25: qty 50

## 2019-05-25 NOTE — Progress Notes (Signed)
Patient has been assessed, vital signs and labs have been reviewed by Dr. Katragadda. ANC, Creatinine, LFTs, and Platelets are within treatment parameters per Dr. Katragadda. The patient is good to proceed with treatment at this time.  

## 2019-05-25 NOTE — Assessment & Plan Note (Signed)
1.  Recurrent endometrial carcinosarcoma, HER-2 negative: -7 cycles of carboplatin and paclitaxel from 12/29/2018 through 05/04/2019. -CT scan on 05/02/2019 showed peritoneal implants in the low central small bowel mesentery and left pelvic sidewall have decreased in size measuring 1.5 x 1.9 cm and 1.7 x 2.2 cm.  Soft tissue nodule in the left lateral omentum measures 1 x 1.7 cm.  No new evidence of metastatic disease. -CA-125 has normalized.  She met with Dr. Denman George who recommended continuing chemotherapy until complete response. -We will plan to reduce scans after every 3 cycles. -I reviewed her labs today.  White count and LFTs are normal.  Platelets are normal.  She will proceed with cycle 8 today.  2.  Right forearm pain: -She has developed pain in her right forearm in the last 3 weeks.  It is reported as shooting pain, when she extends her hands to reach for objects.  Pain goes away after a few seconds when she retracts her hand. -There is a slight swelling towards the radial side of the distal part of the forearm above the wrist. -I have recommended a CT scan of the soft tissues of the forearm.  3.  Leg pains: -She uses Percocet as needed.  4.  Breast abnormality: -Mammogram on 05/02/2019 shows BI-RADS Category 0.  She reportedly took Covid shots 1 to 2 weeks prior to the mammogram. -Further scans are rescheduled on 06/21/2019.

## 2019-05-25 NOTE — Progress Notes (Signed)
[facility] [place], [place] [address]   CLINIC:  Medical Oncology/Hematology  PCP:  Patient, No Pcp Per No address on file None   REASON FOR VISIT:  Follow-up for recurrent endometrial carcinosarcoma.  CURRENT THERAPY: Carboplatin and paclitaxel every 3 weeks.  BRIEF ONCOLOGIC HISTORY:  Oncology History  Endometrial cancer ([place])  03/31/2018 Initial Diagnosis   Endometrial cancer ([place])   12/29/2018 -  Chemotherapy   The patient had palonosetron (ALOXI) injection 0.25 mg, 0.25 mg, Intravenous,  Once, 8 of 9 cycles Administration: 0.25 mg (12/29/2018), 0.25 mg (01/19/2019), 0.25 mg (02/09/2019), 0.25 mg (03/02/2019), 0.25 mg (03/23/2019), 0.25 mg (04/13/2019), 0.25 mg (05/04/2019), 0.25 mg (05/25/2019) pegfilgrastim-cbqv (UDENYCA) injection 6 mg, 6 mg, Subcutaneous, Once, 8 of 9 cycles Administration: 6 mg (12/31/2018), 6 mg (01/20/2019), 6 mg (02/11/2019), 6 mg (03/04/2019), 6 mg (03/25/2019), 6 mg (04/15/2019), 6 mg (05/06/2019) CARBOplatin (PARAPLATIN) 860 mg in sodium chloride 0.9 % 500 mL chemo infusion, 860 mg (100 % of original dose 862.8 mg), Intravenous,  Once, 8 of 9 cycles Dose modification:   (original dose 862.8 mg, Cycle 1),   (original dose 862.8 mg, Cycle 2),   (original dose 862.8 mg, Cycle 3),   (original dose 862.8 mg, Cycle 4),   (original dose 862.8 mg, Cycle 5),   (original dose 862.8 mg, Cycle 6),   (original dose 862.8 mg, Cycle 8),   (original dose 862.8 mg, Cycle 8) Administration: 860 mg (12/29/2018), 860 mg (01/19/2019), 860 mg (02/09/2019), 860 mg (03/02/2019), 860 mg (03/23/2019), 860 mg (04/13/2019), 860 mg (05/04/2019), 860 mg (05/25/2019) fosaprepitant (EMEND) 150 mg in sodium chloride 0.9 % 145 mL IVPB, 150 mg, Intravenous,  Once, 8 of 9 cycles Administration: 150 mg (01/19/2019), 150 mg (02/09/2019), 150 mg (03/02/2019), 150 mg (03/23/2019), 150 mg (04/13/2019), 150 mg (05/04/2019), 150 mg (05/25/2019) PACLitaxel (TAXOL) 360 mg in sodium chloride 0.9 % 500  mL chemo infusion (> 79m/m2), 175 mg/m2 = 360 mg, Intravenous,  Once, 8 of 9 cycles Dose modification: 140 mg/m2 (80 % of original dose 175 mg/m2, Cycle 7, Reason: Other (see comments), Comment: hand pains), 140 mg/m2 (80 % of original dose 175 mg/m2, Cycle 8, Reason: Other (see comments), Comment: neoropathic pain) Administration: 360 mg (12/29/2018), 360 mg (01/19/2019), 360 mg (02/09/2019), 360 mg (03/02/2019), 360 mg (03/23/2019), 360 mg (04/13/2019), 288 mg (05/04/2019), 288 mg (05/25/2019)  for chemotherapy treatment.       CANCER STAGING: Cancer Staging No matching staging information was found for the patient.   INTERVAL HISTORY:  Jessica [name] seen for follow-up of endometrial carcinosarcoma and toxicity assessment prior to cycle 8 of chemotherapy.  Reports appetite of 100%.  Energy levels are 25%.  Reports pain in the right forearm when she tries to reach out to grab objects.  When she retracts her pain goes away.  Pain is reported as shooting pain.  She had this for the past 3 to 4 weeks.  Denies any major worsening of occasional tingling in the fingertips and toes.  REVIEW OF SYSTEMS:  Review of Systems  Musculoskeletal:       Right forearm pain.  Neurological: Positive for numbness.  All other systems reviewed and are negative.    PAST MEDICAL/SURGICAL HISTORY:  Past Medical History:  Diagnosis Date  . Elevated blood pressure reading   . Endometrial cancer ([place]   . PMB (postmenopausal bleeding)   . Port-A-Cath in place 12/24/2018   Past Surgical History:  Procedure Laterality  Date  . IR IMAGING GUIDED PORT INSERTION  12/28/2018  . IR RADIOLOGIST EVAL & MGMT  06/02/2018  . IR RADIOLOGIST EVAL & MGMT  06/16/2018  . NECK SURGERY  2000   spinal surgery , titanium rod in place   . ROBOTIC ASSISTED TOTAL HYSTERECTOMY WITH BILATERAL SALPINGO OOPHERECTOMY N/A 04/13/2018   Procedure: XI ROBOTIC ASSISTED TOTAL HYSTERECTOMY WITH BILATERAL SALPINGO OOPHORECTOMY;  Surgeon:  Everitt Amber, MD;  Location: [facility];  Service: Gynecology;  Laterality: N/A;  . ROBOTIC PELVIC AND PARA-AORTIC LYMPH NODE DISSECTION N/A 04/13/2018   Procedure: XI ROBOTIC PELVIC LYMPHADECTOMY AND PARA-AORTIC LYMPH NODE DISSECTION;  Surgeon: Everitt Amber, MD;  Location: [facility];  Service: Gynecology;  Laterality: N/A;     SOCIAL HISTORY:  Social History   Socioeconomic History  . Marital status: Widowed    Spouse name: Not on file  . Number of children: Not on file  . Years of education: Not on file  . Highest education level: Not on file  Occupational History  . Not on file  Tobacco Use  . Smoking status: Never Smoker  . Smokeless tobacco: Never Used  Substance and Sexual Activity  . Alcohol use: Yes    Alcohol/week: 4.0 standard drinks    Types: 4 Glasses of wine per week    Comment: weekends  . Drug use: Never  . Sexual activity: Not Currently  Other Topics Concern  . Not on file  Social History Narrative  . Not on file   Social Determinants of Health   Financial Resource Strain:   . Difficulty of Paying Living Expenses:   Food Insecurity:   . Worried About Charity fundraiser in the Last Year:   . Arboriculturist in the Last Year:   Transportation Needs:   . Film/video editor (Medical):   Marland Kitchen Lack of Transportation (Non-Medical):   Physical Activity:   . Days of Exercise per Week:   . Minutes of Exercise per Session:   Stress:   . Feeling of Stress :   Social Connections:   . Frequency of Communication with Friends and Family:   . Frequency of Social Gatherings with Friends and Family:   . Attends Religious Services:   . Active Member of Clubs or Organizations:   . Attends Archivist Meetings:   Marland Kitchen Marital Status:   Intimate Partner Violence:   . Fear of Current or Ex-Partner:   . Emotionally Abused:   Marland Kitchen Physically Abused:   . Sexually Abused:     FAMILY HISTORY:  Family History  Problem Relation Age of Onset  . Hypertension Father   .  Heart disease Father   . Cancer Father        lung  . Hypertension Mother     CURRENT MEDICATIONS:  Outpatient Encounter Medications as of 05/25/2019  Medication Sig  . amLODipine (NORVASC) 5 MG tablet Take 1 tablet (5 mg total) by mouth daily.  Marland Kitchen atorvastatin (LIPITOR) 20 MG tablet Take 1 tablet (20 mg total) by mouth daily.  Marland Kitchen CARBOPLATIN IV Inject into the vein every 21 ( twenty-one) days.  Marland Kitchen dicyclomine (BENTYL) 20 MG tablet Take 1 tablet (20 mg total) by mouth every 6 (six) hours.  . lidocaine-prilocaine (EMLA) cream Apply small amount to port a cath site and cover with plastic wrap one hour prior to chemotherapy appointments  . Multiple Vitamin (MULTIVITAMIN) tablet Take 1 tablet by mouth daily.  Marland Kitchen PACLITAXEL IV Inject into the vein every  21 ( twenty-one) days.  . CONSTULOSE 10 GM/15ML solution Take 30 g by mouth daily as needed.   Marland Kitchen ibuprofen (ADVIL) 200 MG tablet Take 200 mg by mouth every 6 (six) hours as needed.  Marland Kitchen oxyCODONE-acetaminophen (PERCOCET) 5-325 MG tablet Take 1 tablet by mouth every 8 (eight) hours as needed (pain). (Patient not taking: Reported on 05/25/2019)  . phenazopyridine (PYRIDIUM) 100 MG tablet Take 1 tablet (100 mg total) by mouth 2 (two) times daily as needed for pain. (Patient not taking: Reported on 05/25/2019)  . polyethylene glycol (MIRALAX / GLYCOLAX) 17 g packet Take 17 g by mouth 2 (two) times daily as needed.  . prochlorperazine (COMPAZINE) 10 MG tablet Take 1 tablet (10 mg total) by mouth every 6 (six) hours as needed (Nausea or vomiting). (Patient not taking: Reported on 05/25/2019)   No facility-administered encounter medications on file as of 05/25/2019.    ALLERGIES:  No Known Allergies   PHYSICAL EXAM:  ECOG Performance status: 1  Vitals:   05/25/19 0950  BP: 104/69  Pulse: 74  Resp: 18  Temp: (!) 96 F (35.6 C)  SpO2: 98%   Filed Weights   05/25/19 0950  Weight: 223 lb (101.2 kg)    Physical Exam Vitals reviewed.  Constitutional:        Appearance: Normal appearance.  Cardiovascular:     Rate and Rhythm: Normal rate and regular rhythm.     Heart sounds: Normal heart sounds.  Pulmonary:     Effort: Pulmonary effort is normal.     Breath sounds: Normal breath sounds.  Abdominal:     General: There is no distension.     Palpations: Abdomen is soft. There is no mass.  Skin:    General: Skin is warm.  Neurological:     General: No focal deficit present.     Mental Status: She is alert and oriented to person, place, and time.  Psychiatric:        Mood and Affect: Mood normal.        Behavior: Behavior normal.    There is some soft tissue swelling about the right wrist towards the radial side.  LABORATORY DATA:  I have reviewed the labs as listed.  CBC    Component Value Date/Time   WBC 6.1 05/25/2019 0944   RBC 3.18 (L) 05/25/2019 0944   HGB 10.6 (L) 05/25/2019 0944   HCT 31.8 (L) 05/25/2019 0944   PLT 187 05/25/2019 0944   MCV 100.0 05/25/2019 0944   MCH 33.3 05/25/2019 0944   MCHC 33.3 05/25/2019 0944   RDW 15.7 (H) 05/25/2019 0944   LYMPHSABS 1.8 05/25/2019 0944   MONOABS 0.5 05/25/2019 0944   EOSABS 0.0 05/25/2019 0944   BASOSABS 0.0 05/25/2019 0944   CMP Latest Ref Rng & Units 05/25/2019 05/04/2019 04/13/2019  Glucose 70 - 99 mg/dL 96 106(H) 100(H)  BUN 6 - 20 mg/dL _0 Creatinine 0.44 - 1.00 mg/dL 0.44 0.54 0.45  Sodium 135 - 145 mmol/L 137 138 141  Potassium 3.5 - 5.1 mmol/L 3.8 3.9 3.6  Chloride 98 - 111 mmol/L 105 104 107  CO2 22 - 32 mmol/L _1 Calcium 8.9 - 10.3 mg/dL 9.2 9.2 9.0  Total Protein 6.5 - 8.1 g/dL 7.3 7.0 6.7  Total Bilirubin 0.3 - 1.2 mg/dL 0.4 0.5 0.3  Alkaline Phos 38 - 126 U/L 117 114 122  AST 15 - 41 U/L _2 ALT 0 - 44 U/L  _0 DIAGNOSTIC IMAGING:  I have reviewed scans.   ASSESSMENT & PLAN:   Endometrial cancer ([place]) 1.  Recurrent endometrial carcinosarcoma, HER-2 negative: -7 cycles of carboplatin and paclitaxel from 12/29/2018  through 05/04/2019. -CT scan on 05/02/2019 showed peritoneal implants in the low central small bowel mesentery and left pelvic sidewall have decreased in size measuring 1.5 x 1.9 cm and 1.7 x 2.2 cm.  Soft tissue nodule in the left lateral omentum measures 1 x 1.7 cm.  No new evidence of metastatic disease. -CA-125 has normalized.  She met with Dr. Denman George who recommended continuing chemotherapy until complete response. -We will plan to reduce scans after every 3 cycles. -I reviewed her labs today.  White count and LFTs are normal.  Platelets are normal.  She will proceed with cycle 8 today.  2.  Right forearm pain: -She has developed pain in her right forearm in the last 3 weeks.  It is reported as shooting pain, when she extends her hands to reach for objects.  Pain goes away after a few seconds when she retracts her hand. -There is a slight swelling towards the radial side of the distal part of the forearm above the wrist. -I have recommended a CT scan of the soft tissues of the forearm.  3.  Leg pains: -She uses Percocet as needed.  4.  Breast abnormality: -Mammogram on 05/02/2019 shows BI-RADS Category 0.  She reportedly took Covid shots 1 to 2 weeks prior to the mammogram. -Further scans are rescheduled on 06/21/2019.      Orders placed this encounter:  Orders Placed This Encounter  Procedures  . CT FOREARM RIGHT W CONTRAST      [name], [place] [phone]

## 2019-05-25 NOTE — Progress Notes (Signed)
Patient presents today for treatment and follow up visit with Dr.Katragadda. Labs within parameters for treatment. Vital signs within parameters for treatment. Patient has no complaints of any significant changes since her last visit. Patient had complaints of pain in her right forearm that she states is new since her last visit. Patient instructed to inform Dr. Delton Coombes at the visit.   Message received from [facility] LPN/ Dr. Delton Coombes to proceed with treatment.   Treatment given today per MD orders. Tolerated infusion without adverse affects. Vital signs stable. No complaints at this time. Discharged from clinic ambulatory. F/U with [facility] as scheduled.

## 2019-05-25 NOTE — Patient Instructions (Signed)
[facility] Discharge Instructions for Patients Receiving Chemotherapy  Today you received the following chemotherapy agents   To help prevent nausea and vomiting after your treatment, we encourage you to take your nausea medication   If you develop nausea and vomiting that is not controlled by your nausea medication, call the clinic.   BELOW ARE SYMPTOMS THAT SHOULD BE REPORTED IMMEDIATELY:  *FEVER GREATER THAN 100.5 F  *CHILLS WITH OR WITHOUT FEVER  NAUSEA AND VOMITING THAT IS NOT CONTROLLED WITH YOUR NAUSEA MEDICATION  *UNUSUAL SHORTNESS OF BREATH  *UNUSUAL BRUISING OR BLEEDING  TENDERNESS IN MOUTH AND THROAT WITH OR WITHOUT PRESENCE OF ULCERS  *URINARY PROBLEMS  *BOWEL PROBLEMS  UNUSUAL RASH Items with * indicate a potential emergency and should be followed up as soon as possible.  Feel free to call the clinic should you have any questions or concerns. The clinic phone number is [phone].  Please show the CHEMO ALERT CARD at check-in to the Emergency Department and triage nurse.   

## 2019-05-25 NOTE — Patient Instructions (Addendum)
[place] at [facility] Discharge Instructions  You were seen today by Dr. Delton Coombes. He went over your recent lab results. He will schedule you for a CT of your forearm to evaluate the pain. He will see you back in 3 weeks for labs, treatment and follow up.   Thank you for choosing [place] at [facility] to provide your oncology and hematology care.  To afford each patient quality time with our provider, please arrive at least 15 minutes before your scheduled appointment time.   If you have a lab appointment with the [facility] please come in thru the  Main Entrance and check in at the main information desk  You need to re-schedule your appointment should you arrive 10 or more minutes late.  We strive to give you quality time with our providers, and arriving late affects you and other patients whose appointments are after yours.  Also, if you no show three or more times for appointments you may be dismissed from the clinic at the providers discretion.     Again, thank you for choosing [facility].  Our hope is that these requests will decrease the amount of time that you wait before being seen by our physicians.       _____________________________________________________________  Should you have questions after your visit to [facility], please contact our office at [phone] between the hours of 8:00 a.m. and 4:30 p.m.  Voicemails left after 4:00 p.m. will not be returned until the following business day.  For prescription refill requests, have your pharmacy contact our office and allow 72 hours.    Cancer Center Support Programs:   > Cancer Support Group  2nd Tuesday of the month 1pm-2pm, Journey Room

## 2019-05-27 ENCOUNTER — Other Ambulatory Visit: Payer: Self-pay

## 2019-05-27 ENCOUNTER — Inpatient Hospital Stay (HOSPITAL_COMMUNITY): Payer: Medicaid Other

## 2019-05-27 VITALS — BP 128/70 | HR 59 | Temp 96.8°F | Resp 17

## 2019-05-27 DIAGNOSIS — C541 Malignant neoplasm of endometrium: Secondary | ICD-10-CM

## 2019-05-27 DIAGNOSIS — Z95828 Presence of other vascular implants and grafts: Secondary | ICD-10-CM

## 2019-05-27 DIAGNOSIS — Z5111 Encounter for antineoplastic chemotherapy: Secondary | ICD-10-CM | POA: Diagnosis not present

## 2019-05-27 MED ORDER — PEGFILGRASTIM-CBQV 6 MG/0.6ML ~~LOC~~ SOSY
6.0000 mg | PREFILLED_SYRINGE | Freq: Once | SUBCUTANEOUS | Status: AC
  Administered 2019-05-27: 6 mg via SUBCUTANEOUS
  Filled 2019-05-27: qty 0.6

## 2019-05-27 NOTE — Progress Notes (Signed)
Patient tolerated Udenyca injection with no complaints voiced.  Site clean and dry with no bruising or swelling noted.  No complaints of pain.  Discharged with vital signs stable and no signs or symptoms of distress noted.  

## 2019-06-08 ENCOUNTER — Other Ambulatory Visit (HOSPITAL_COMMUNITY): Payer: Self-pay | Admitting: Nurse Practitioner

## 2019-06-08 ENCOUNTER — Ambulatory Visit (HOSPITAL_COMMUNITY): Payer: Medicaid Other

## 2019-06-08 DIAGNOSIS — M7989 Other specified soft tissue disorders: Secondary | ICD-10-CM

## 2019-06-10 ENCOUNTER — Other Ambulatory Visit: Payer: Self-pay

## 2019-06-10 ENCOUNTER — Encounter (HOSPITAL_COMMUNITY)
Admission: RE | Admit: 2019-06-10 | Discharge: 2019-06-10 | Disposition: A | Discharge status: Home / Self Care | Payer: Medicaid Other | Source: Ambulatory Visit | Attending: Nurse Practitioner | Admitting: Nurse Practitioner

## 2019-06-10 ENCOUNTER — Ambulatory Visit (HOSPITAL_COMMUNITY)
Admission: RE | Admit: 2019-06-10 | Discharge: 2019-06-10 | Disposition: A | Discharge status: Home / Self Care | Payer: Medicaid Other | Source: Ambulatory Visit | Attending: Nurse Practitioner | Admitting: Nurse Practitioner

## 2019-06-10 ENCOUNTER — Encounter (HOSPITAL_COMMUNITY): Payer: Self-pay

## 2019-06-10 DIAGNOSIS — M7989 Other specified soft tissue disorders: Secondary | ICD-10-CM | POA: Diagnosis not present

## 2019-06-10 DIAGNOSIS — M79631 Pain in right forearm: Secondary | ICD-10-CM | POA: Diagnosis not present

## 2019-06-10 HISTORY — DX: Essential (primary) hypertension: I10

## 2019-06-10 MED ORDER — TECHNETIUM TC 99M MEDRONATE IV KIT
20.0000 | PACK | Freq: Once | INTRAVENOUS | Status: AC | PRN
  Administered 2019-06-10: 21.6 via INTRAVENOUS

## 2019-06-15 ENCOUNTER — Inpatient Hospital Stay (HOSPITAL_COMMUNITY): Payer: Medicaid Other

## 2019-06-15 ENCOUNTER — Other Ambulatory Visit: Payer: Self-pay

## 2019-06-15 ENCOUNTER — Inpatient Hospital Stay (HOSPITAL_BASED_OUTPATIENT_CLINIC_OR_DEPARTMENT_OTHER): Payer: Medicaid Other | Admitting: Hematology

## 2019-06-15 VITALS — BP 138/73 | HR 64 | Temp 97.0°F | Resp 18 | Wt 224.8 lb

## 2019-06-15 VITALS — BP 129/64 | HR 66 | Temp 97.5°F | Resp 18

## 2019-06-15 DIAGNOSIS — Z95828 Presence of other vascular implants and grafts: Secondary | ICD-10-CM

## 2019-06-15 DIAGNOSIS — C541 Malignant neoplasm of endometrium: Secondary | ICD-10-CM

## 2019-06-15 DIAGNOSIS — Z5111 Encounter for antineoplastic chemotherapy: Secondary | ICD-10-CM | POA: Diagnosis not present

## 2019-06-15 LAB — COMPREHENSIVE METABOLIC PANEL
ALT: 25 U/L (ref 0–44)
AST: 19 U/L (ref 15–41)
Albumin: 4.1 g/dL (ref 3.5–5.0)
Alkaline Phosphatase: 124 U/L (ref 38–126)
Anion gap: 10 (ref 5–15)
BUN: 14 mg/dL (ref 6–20)
CO2: 24 mmol/L (ref 22–32)
Calcium: 9.1 mg/dL (ref 8.9–10.3)
Chloride: 103 mmol/L (ref 98–111)
Creatinine, Ser: 0.56 mg/dL (ref 0.44–1.00)
GFR calc Af Amer: >60 mL/min (ref >60)
GFR calc non Af Amer: >60 mL/min (ref >60)
Glucose, Bld: 104 mg/dL — ABNORMAL HIGH (ref 70–99)
Potassium: 3.7 mmol/L (ref 3.5–5.1)
Sodium: 137 mmol/L (ref 135–145)
Total Bilirubin: 0.6 mg/dL (ref 0.3–1.2)
Total Protein: 7 g/dL (ref 6.5–8.1)

## 2019-06-15 LAB — CBC WITH DIFFERENTIAL/PLATELET
Abs Immature Granulocytes: 0.02 10*3/uL (ref 0.00–0.07)
Basophils Absolute: 0 10*3/uL (ref 0.0–0.1)
Basophils Relative: 1 %
Eosinophils Absolute: 0 10*3/uL (ref 0.0–0.5)
Eosinophils Relative: 1 %
HCT: 30 % — ABNORMAL LOW (ref 36.0–46.0)
Hemoglobin: 10 g/dL — ABNORMAL LOW (ref 12.0–15.0)
Immature Granulocytes: 1 %
Lymphocytes Relative: 42 %
Lymphs Abs: 1.7 10*3/uL (ref 0.7–4.0)
MCH: 33.3 pg (ref 26.0–34.0)
MCHC: 33.3 g/dL (ref 30.0–36.0)
MCV: 100 fL (ref 80.0–100.0)
Monocytes Absolute: 0.5 10*3/uL (ref 0.1–1.0)
Monocytes Relative: 12 %
Neutro Abs: 1.8 10*3/uL (ref 1.7–7.7)
Neutrophils Relative %: 43 %
Platelets: 174 10*3/uL (ref 150–400)
RBC: 3 MIL/uL — ABNORMAL LOW (ref 3.87–5.11)
RDW: 14.9 % (ref 11.5–15.5)
WBC: 4 10*3/uL (ref 4.0–10.5)
nRBC: 0 % (ref 0.0–0.2)

## 2019-06-15 MED ORDER — SODIUM CHLORIDE 0.9 % IV SOLN
140.0000 mg/m2 | Freq: Once | INTRAVENOUS | Status: AC
  Administered 2019-06-15: 288 mg via INTRAVENOUS
  Filled 2019-06-15: qty 48

## 2019-06-15 MED ORDER — SODIUM CHLORIDE 0.9 % IV SOLN
862.8000 mg | Freq: Once | INTRAVENOUS | Status: AC
  Administered 2019-06-15: 860 mg via INTRAVENOUS
  Filled 2019-06-15: qty 86

## 2019-06-15 MED ORDER — SODIUM CHLORIDE 0.9% FLUSH
10.0000 mL | INTRAVENOUS | Status: DC | PRN
  Administered 2019-06-15: 10 mL

## 2019-06-15 MED ORDER — DIPHENHYDRAMINE HCL 50 MG/ML IJ SOLN
50.0000 mg | Freq: Once | INTRAMUSCULAR | Status: AC
  Administered 2019-06-15: 50 mg via INTRAVENOUS
  Filled 2019-06-15: qty 1

## 2019-06-15 MED ORDER — FAMOTIDINE IN NACL 20-0.9 MG/50ML-% IV SOLN
20.0000 mg | Freq: Once | INTRAVENOUS | Status: AC
  Administered 2019-06-15: 20 mg via INTRAVENOUS
  Filled 2019-06-15: qty 50

## 2019-06-15 MED ORDER — SODIUM CHLORIDE 0.9 % IV SOLN: Freq: Once | INTRAVENOUS | Status: AC

## 2019-06-15 MED ORDER — SODIUM CHLORIDE 0.9 % IV SOLN
150.0000 mg | Freq: Once | INTRAVENOUS | Status: AC
  Administered 2019-06-15: 150 mg via INTRAVENOUS
  Filled 2019-06-15: qty 150

## 2019-06-15 MED ORDER — PALONOSETRON HCL INJECTION 0.25 MG/5ML
0.2500 mg | Freq: Once | INTRAVENOUS | Status: AC
  Administered 2019-06-15: 0.25 mg via INTRAVENOUS
  Filled 2019-06-15: qty 5

## 2019-06-15 MED ORDER — HEPARIN SOD (PORK) LOCK FLUSH 100 UNIT/ML IV SOLN
500.0000 [IU] | Freq: Once | INTRAVENOUS | Status: AC | PRN
  Administered 2019-06-15: 500 [IU]

## 2019-06-15 MED ORDER — SODIUM CHLORIDE 0.9 % IV SOLN
10.0000 mg | Freq: Once | INTRAVENOUS | Status: AC
  Administered 2019-06-15: 10 mg via INTRAVENOUS
  Filled 2019-06-15: qty 10

## 2019-06-15 NOTE — Progress Notes (Signed)
[place], [place] [address]   CLINIC:  Medical Oncology/Hematology  PCP:  Patient, No Pcp Per None None   REASON FOR VISIT:  Follow-up for recurrent endometrial carcinosarcoma  PRIOR THERAPY: TAH, BSO, bilateral pelvic and para-aortic lymph node dissections on 04/13/2018.  NGS Results: Not done.  CURRENT THERAPY: Carboplatin and paclitaxel every 3 weeks  BRIEF ONCOLOGIC HISTORY:  Oncology History  Endometrial cancer ([place])  03/31/2018 Initial Diagnosis   Endometrial cancer ([place])   12/29/2018 -  Chemotherapy   The patient had palonosetron (ALOXI) injection 0.25 mg, 0.25 mg, Intravenous,  Once, 8 of 9 cycles Administration: 0.25 mg (12/29/2018), 0.25 mg (01/19/2019), 0.25 mg (02/09/2019), 0.25 mg (03/02/2019), 0.25 mg (03/23/2019), 0.25 mg (04/13/2019), 0.25 mg (05/04/2019), 0.25 mg (05/25/2019) pegfilgrastim-cbqv (UDENYCA) injection 6 mg, 6 mg, Subcutaneous, Once, 8 of 9 cycles Administration: 6 mg (12/31/2018), 6 mg (01/20/2019), 6 mg (02/11/2019), 6 mg (03/04/2019), 6 mg (03/25/2019), 6 mg (04/15/2019), 6 mg (05/06/2019), 6 mg (05/27/2019) CARBOplatin (PARAPLATIN) 860 mg in sodium chloride 0.9 % 500 mL chemo infusion, 860 mg (100 % of original dose 862.8 mg), Intravenous,  Once, 8 of 9 cycles Dose modification:   (original dose 862.8 mg, Cycle 1),   (original dose 862.8 mg, Cycle 2),   (original dose 862.8 mg, Cycle 3),   (original dose 862.8 mg, Cycle 4),   (original dose 862.8 mg, Cycle 5),   (original dose 862.8 mg, Cycle 6),   (original dose 862.8 mg, Cycle 8),   (original dose 862.8 mg, Cycle 8) Administration: 860 mg (12/29/2018), 860 mg (01/19/2019), 860 mg (02/09/2019), 860 mg (03/02/2019), 860 mg (03/23/2019), 860 mg (04/13/2019), 860 mg (05/04/2019), 860 mg (05/25/2019) fosaprepitant (EMEND) 150 mg in sodium chloride 0.9 % 145 mL IVPB, 150 mg, Intravenous,  Once, 8 of 9 cycles Administration: 150 mg (01/19/2019), 150 mg (02/09/2019), 150 mg (03/02/2019), 150 mg  (03/23/2019), 150 mg (04/13/2019), 150 mg (05/04/2019), 150 mg (05/25/2019) PACLitaxel (TAXOL) 360 mg in sodium chloride 0.9 % 500 mL chemo infusion (> 46m/m2), 175 mg/m2 = 360 mg, Intravenous,  Once, 8 of 9 cycles Dose modification: 140 mg/m2 (80 % of original dose 175 mg/m2, Cycle 7, Reason: Other (see comments), Comment: hand pains), 140 mg/m2 (80 % of original dose 175 mg/m2, Cycle 8, Reason: Other (see comments), Comment: neoropathic pain) Administration: 360 mg (12/29/2018), 360 mg (01/19/2019), 360 mg (02/09/2019), 360 mg (03/02/2019), 360 mg (03/23/2019), 360 mg (04/13/2019), 288 mg (05/04/2019), 288 mg (05/25/2019)  for chemotherapy treatment.      CANCER STAGING: Cancer Staging No matching staging information was found for the patient.  INTERVAL HISTORY:  Ms. SLATONA KRICHBAUM a 59y.o. female, returns for routine follow-up and consideration for next cycle of chemotherapy. SAnisewas last seen on 05/25/2019.  Due for cycle #9 of carboplatin and paclitaxel today.   Overall, she tells me she has been feeling pretty well. She notes that she had to take her nausea medicine following her last treatment to keep from getting sick. Her appetite has been well otherwise.   Overall, she feels ready for next cycle of chemo today.    REVIEW OF SYSTEMS:  Review of Systems  Constitutional: Negative for appetite change.  Gastrointestinal: Positive for abdominal pain (sharp, sudden, occasional) and nausea (after Tx).  Genitourinary: Positive for difficulty urinating (emptying bladder) and frequency. Negative for dysuria and hematuria.   Musculoskeletal: Positive for back pain.  Neurological: Negative for numbness.  All other systems reviewed and  are negative.   PAST MEDICAL/SURGICAL HISTORY:  Past Medical History:  Diagnosis Date   Elevated blood pressure reading    Endometrial cancer ([place])    Hypertension    PMB (postmenopausal bleeding)    Port-A-Cath in place 12/24/2018   Past Surgical  History:  Procedure Laterality Date   IR IMAGING GUIDED PORT INSERTION  12/28/2018   IR RADIOLOGIST EVAL & MGMT  06/02/2018   IR RADIOLOGIST EVAL & MGMT  06/16/2018   NECK SURGERY  2000   spinal surgery , titanium rod in place    ROBOTIC ASSISTED TOTAL HYSTERECTOMY WITH BILATERAL SALPINGO OOPHERECTOMY N/A 04/13/2018   Procedure: XI ROBOTIC [place];  Surgeon: Everitt Amber, MD;  Location: [facility];  Service: Gynecology;  Laterality: N/A;   ROBOTIC PELVIC AND PARA-AORTIC LYMPH NODE DISSECTION N/A 04/13/2018   Procedure: XI ROBOTIC PELVIC LYMPHADECTOMY AND PARA-AORTIC LYMPH NODE DISSECTION;  Surgeon: Everitt Amber, MD;  Location: [facility];  Service: Gynecology;  Laterality: N/A;    SOCIAL HISTORY:  Social History   Socioeconomic History   Marital status: Widowed    Spouse name: Not on file   Number of children: Not on file   Years of education: Not on file   Highest education level: Not on file  Occupational History   Not on file  Tobacco Use   Smoking status: Never Smoker   Smokeless tobacco: Never Used  Substance and Sexual Activity   Alcohol use: Yes    Alcohol/week: 4.0 standard drinks    Types: 4 Glasses of wine per week    Comment: weekends   Drug use: Never   Sexual activity: Not Currently  Other Topics Concern   Not on file  Social History Narrative   Not on file   Social Determinants of Health   Financial Resource Strain:    Difficulty of Paying Living Expenses:   Food Insecurity:    Worried About Charity fundraiser in the Last Year:    Arboriculturist in the Last Year:   Transportation Needs:    Film/video editor (Medical):    Lack of Transportation (Non-Medical):   Physical Activity:    Days of Exercise per Week:    Minutes of Exercise per Session:   Stress:    Feeling of Stress :   Social Connections:    Frequency of Communication with Friends and Family:    Frequency of  Social Gatherings with Friends and Family:    Attends Religious Services:    Active Member of Clubs or Organizations:    Attends Music therapist:    Marital Status:   Intimate Partner Violence:    Fear of Current or Ex-Partner:    Emotionally Abused:    Physically Abused:    Sexually Abused:     FAMILY HISTORY:  Family History  Problem Relation Age of Onset   Hypertension Father    Heart disease Father    Cancer Father        lung   Hypertension Mother     CURRENT MEDICATIONS:  Current Outpatient Medications  Medication Sig Dispense Refill   amLODipine (NORVASC) 5 MG tablet Take 1 tablet (5 mg total) by mouth daily. 90 tablet 1   atorvastatin (LIPITOR) 20 MG tablet Take 1 tablet (20 mg total) by mouth daily. 90 tablet 1   CARBOPLATIN IV Inject into the vein every 21 ( twenty-one) days.     dicyclomine (BENTYL) 20 MG tablet  Take 1 tablet (20 mg total) by mouth every 6 (six) hours. 30 tablet 1   lidocaine-prilocaine (EMLA) cream Apply small amount to port a cath site and cover with plastic wrap one hour prior to chemotherapy appointments 30 g 3   Multiple Vitamin (MULTIVITAMIN) tablet Take 1 tablet by mouth daily.     PACLITAXEL IV Inject into the vein every 21 ( twenty-one) days.     prochlorperazine (COMPAZINE) 10 MG tablet Take 1 tablet (10 mg total) by mouth every 6 (six) hours as needed (Nausea or vomiting). 30 tablet 1   CONSTULOSE 10 GM/15ML solution Take 30 g by mouth daily as needed.      ibuprofen (ADVIL) 200 MG tablet Take 200 mg by mouth every 6 (six) hours as needed.     oxyCODONE-acetaminophen (PERCOCET) 5-325 MG tablet Take 1 tablet by mouth every 8 (eight) hours as needed (pain). (Patient not taking: Reported on 06/15/2019) 30 tablet 0   phenazopyridine (PYRIDIUM) 100 MG tablet Take 1 tablet (100 mg total) by mouth 2 (two) times daily as needed for pain. (Patient not taking: Reported on 06/15/2019) 10 tablet 0   polyethylene  glycol (MIRALAX / GLYCOLAX) 17 g packet Take 17 g by mouth 2 (two) times daily as needed.     No current facility-administered medications for this visit.    ALLERGIES:  No Known Allergies  PHYSICAL EXAM:  Performance status (ECOG): 1 - Symptomatic but completely ambulatory  Vitals:   06/15/19 0827  BP: 138/73  Pulse: 64  Resp: 18  Temp: (!) 97 F (36.1 C)  SpO2: 97%   Wt Readings from Last 3 Encounters:  06/15/19 224 lb 12.8 oz (102 kg)  05/25/19 223 lb (101.2 kg)  05/13/19 223 lb 12.8 oz (101.5 kg)   Physical Exam Vitals reviewed.  Constitutional:      Appearance: Normal appearance.  Cardiovascular:     Rate and Rhythm: Normal rate and regular rhythm.     Heart sounds: Normal heart sounds.  Pulmonary:     Effort: Pulmonary effort is normal.     Breath sounds: Normal breath sounds.  Abdominal:     General: There is no distension.     Palpations: Abdomen is soft. There is no mass.  Skin:    General: Skin is warm.  Neurological:     General: No focal deficit present.     Mental Status: She is alert and oriented to person, place, and time.  Psychiatric:        Mood and Affect: Mood normal.        Behavior: Behavior normal.     LABORATORY DATA:  I have reviewed the labs as listed.  CBC Latest Ref Rng & Units 06/15/2019 05/25/2019 05/04/2019  WBC 4.0 - 10.5 K/uL 4.0 6.1 6.1  Hemoglobin 12.0 - 15.0 g/dL 10.0(L) 10.6(L) 10.3(L)  Hematocrit 36.0 - 46.0 % 30.0(L) 31.8(L) 31.0(L)  Platelets 150 - 400 K/uL 174 187 236   CMP Latest Ref Rng & Units 06/15/2019 05/25/2019 05/04/2019  Glucose 70 - 99 mg/dL 104(H) 96 106(H)  BUN 6 - 20 mg/dL 14 20 20   Creatinine 0.44 - 1.00 mg/dL 0.56 0.44 0.54  Sodium 135 - 145 mmol/L 137 137 138  Potassium 3.5 - 5.1 mmol/L 3.7 3.8 3.9  Chloride 98 - 111 mmol/L 103 105 104  CO2 22 - 32 mmol/L 24 25 24   Calcium 8.9 - 10.3 mg/dL 9.1 9.2 9.2  Total Protein 6.5 - 8.1 g/dL 7.0 7.3 7.0  Total Bilirubin 0.3 - 1.2 mg/dL 0.6 0.4 0.5  Alkaline Phos  38 - 126 U/L 124 117 114  AST 15 - 41 U/L 19 17 23   ALT 0 - 44 U/L 25 23 27     DIAGNOSTIC IMAGING:  I have independently reviewed the scans and discussed with the patient. DG Forearm Right  Result Date: 06/10/2019 CLINICAL DATA:  Right wrist and arm pain. EXAM: RIGHT FOREARM - 2 VIEW COMPARISON:  No recent. FINDINGS: No acute bony or joint abnormality identified. No evidence of fracture or dislocation. IMPRESSION: No acute abnormality identified. Electronically Signed   By: Marcello Moores  Register   On: 06/10/2019 13:44   NM Bone Scan Whole Body  Result Date: 06/10/2019 CLINICAL DATA:  Back pain for 3 months, history of uterine cancer with abdominal metastases EXAM: NUCLEAR MEDICINE WHOLE BODY BONE SCAN TECHNIQUE: Whole body anterior and posterior images were obtained approximately 3 hours after intravenous injection of radiopharmaceutical. RADIOPHARMACEUTICALS:  21.6 mCi Technetium-58mMDP IV COMPARISON:  05/02/2019 FINDINGS: Anterior and posterior whole body planar images demonstrate normal physiologic excretion of radiotracer within the kidneys and bladder. There is no abnormal focal radiotracer activity to suggest bony metastases. Specifically, no radiotracer activity within the spine to explain the patient's back pain. IMPRESSION: 1. Unremarkable bone scan.  No evidence of bony metastases. Electronically Signed   By: MRanda NgoM.D.   On: 06/10/2019 16:01     ASSESSMENT:  1.  Recurrent endometrial carcinosarcoma: -TAH, BSO, bilateral pelvic and para-aortic lymph node resections on 04/13/2018. -Pathology showed carcinosarcoma (malignant mixed mullerian tumor) is arising in an endometrial type polyp with no myometrial invasion identified.  High-grade.  0/39 lymph nodes positive.  PT1APN0, FIGO stage Ia.  MMR normal.  MSI-stable. -CTAP on 12/09/2018 for abdominal pain showed extensive peritoneal carcinomatosis and large soft tissue mass in the pelvis. -Biopsy of the omental mass on 12/28/2018  shows poorly differentiated carcinoma consistent with her prior malignancy. -Carboplatin and paclitaxel started on 12/29/2018. -CT scan on 05/02/2019 showed peritoneal implants in the low central small bowel mesentery and left pelvic sidewall have decreased in size measuring 1.5 x 1.9 cm and 1.7 x 2.2 cm.  Soft tissue nodule in the left lateral omentum measures 1 x 1.7 cm with no evidence of metastatic disease. -Continuation of chemotherapy until complete response was recommended.  2.  Breast abnormality: -Mammogram on 05/02/2019 shows BI-RADS Category 0.  She reportedly took Covid shot 1 to 2 weeks prior to the mammogram.  Further scans rescheduled on 06/21/2019.  PLAN:  1.  Recurrent endometrial carcinosarcoma: -I have reviewed her labs.  White count and platelets are adequate to proceed with next treatment without any dose modifications.  LFTs are normal. -He has on and off sharp pains in the left lateral abdomen lasting only few seconds. -We will plan to repeat scans 3 months from the last.  We will reevaluate her in 3 weeks.  2.  Leg pains: -She will continue Percocet 5/325 as needed.  3.  Hypertension: -Continue Norvasc 5 mg daily.  Blood pressure today is 138/73.     Orders placed this encounter:  No orders of the defined types were placed in this encounter.    SDerek Jack MD [facility] [phone]  I, AJacqualyn Posey am acting as a scribe for Dr. SSanda Linger  I, SDerek JackMD, have reviewed the above documentation for accuracy and completeness, and I agree with the above.

## 2019-06-15 NOTE — Patient Instructions (Signed)
[facility] at [facility] Discharge Instructions  Labs drawn from portacath today   Thank you for choosing [facility] at [facility] to provide your oncology and hematology care.  To afford each patient quality time with our provider, please arrive at least 15 minutes before your scheduled appointment time.   If you have a lab appointment with the Cancer Center please come in thru the Main Entrance and check in at the main information desk.  You need to re-schedule your appointment should you arrive 10 or more minutes late.  We strive to give you quality time with our providers, and arriving late affects you and other patients whose appointments are after yours.  Also, if you no show three or more times for appointments you may be dismissed from the clinic at the providers discretion.     Again, thank you for choosing [facility].  Our hope is that these requests will decrease the amount of time that you wait before being seen by our physicians.       _____________________________________________________________  Should you have questions after your visit to  Cancer Center, please contact our office at [phone] between the hours of 8:00 a.m. and 4:30 p.m.  Voicemails left after 4:00 p.m. will not be returned until the following business day.  For prescription refill requests, have your pharmacy contact our office and allow 72 hours.    Due to Covid, you will need to wear a mask upon entering the hospital. If you do not have a mask, a mask will be given to you at the Main Entrance upon arrival. For doctor visits, patients may have 1 support person with them. For treatment visits, patients can not have anyone with them due to social distancing guidelines and our immunocompromised population.     

## 2019-06-15 NOTE — Progress Notes (Signed)
Labs reviewed with MD today. Proceed as planned per MD.  Treatment given per orders. Patient tolerated it well without problems. Vitals stable and discharged home from clinic ambulatory. Follow up as scheduled.  

## 2019-06-15 NOTE — Patient Instructions (Signed)
[facility] at [facility] Discharge Instructions  You were seen today by Dr. Delton Coombes. He went over your recent results. We will schedule for an abdomen and pelvis CT scan. We will also refer you to urology for your urinary issues. He will see you back in 3 weeks for labs and follow up.   Thank you for choosing [place] at [facility] to provide your oncology and hematology care.  To afford each patient quality time with our provider, please arrive at least 15 minutes before your scheduled appointment time.   If you have a lab appointment with the [facility] please come in thru the  Main Entrance and check in at the main information desk  You need to re-schedule your appointment should you arrive 10 or more minutes late.  We strive to give you quality time with our providers, and arriving late affects you and other patients whose appointments are after yours.  Also, if you no show three or more times for appointments you may be dismissed from the clinic at the providers discretion.     Again, thank you for choosing [facility].  Our hope is that these requests will decrease the amount of time that you wait before being seen by our physicians.       _____________________________________________________________  Should you have questions after your visit to [facility], please contact our office at [phone] between the hours of 8:00 a.m. and 4:30 p.m.  Voicemails left after 4:00 p.m. will not be returned until the following business day.  For prescription refill requests, have your pharmacy contact our office and allow 72 hours.    Cancer Center Support Programs:   > Cancer Support Group  2nd Tuesday of the month 1pm-2pm, Journey Room

## 2019-06-15 NOTE — Patient Instructions (Signed)
[facility] Discharge Instructions for Patients Receiving Chemotherapy   Beginning January 23rd 2017 lab work for the Cancer Center will be done in the  Main lab at [place] on 1st floor. If you have a lab appointment with the Cancer Center please come in thru the  Main Entrance and check in at the main information desk   Today you received the following chemotherapy agents Taxol and Carboplatin  To help prevent nausea and vomiting after your treatment, we encourage you to take your nausea medication.   If you develop nausea and vomiting, or diarrhea that is not controlled by your medication, call the clinic.  The clinic phone number is [phone]. Office hours are Monday-Friday 8:30am-5:00pm.  BELOW ARE SYMPTOMS THAT SHOULD BE REPORTED IMMEDIATELY:  *FEVER GREATER THAN 101.0 F  *CHILLS WITH OR WITHOUT FEVER  NAUSEA AND VOMITING THAT IS NOT CONTROLLED WITH YOUR NAUSEA MEDICATION  *UNUSUAL SHORTNESS OF BREATH  *UNUSUAL BRUISING OR BLEEDING  TENDERNESS IN MOUTH AND THROAT WITH OR WITHOUT PRESENCE OF ULCERS  *URINARY PROBLEMS  *BOWEL PROBLEMS  UNUSUAL RASH Items with * indicate a potential emergency and should be followed up as soon as possible. If you have an emergency after office hours please contact your primary care physician or go to the nearest emergency department.  Please call the clinic during office hours if you have any questions or concerns.   You may also contact the Patient Navigator at [phone] should you have any questions or need assistance in obtaining follow up care.      Resources For Cancer Patients and their Caregivers ? American Cancer Society: Can assist with transportation, wigs, general needs, runs Look Good Feel Better.        1-888-227-6333 ? Cancer Care: Provides financial assistance, online support groups, medication/co-pay assistance.  1-800-813-HOPE (4673) ? [facility] Assists  [place] cancer patients and their families through emotional , educational and financial support.  336-427-4357 ? [place] DSS Where to apply for food stamps, Medicaid and utility assistance. 336-342-1394 ? [facility]: Transportation to medical appointments. 336-347-2287 ? Social Security Administration: May apply for disability if have a Stage IV cancer. 336-342-7796 1-800-772-1213 ? [place] Aging, Disability and Transit Services: Assists with nutrition, care and transit needs. 336-349-2343          

## 2019-06-15 NOTE — Progress Notes (Signed)
Patient has been assessed, vital signs and labs have been reviewed by Dr. Katragadda. ANC, Creatinine, LFTs, and Platelets are within treatment parameters per Dr. Katragadda. The patient is good to proceed with treatment at this time.  

## 2019-06-17 ENCOUNTER — Other Ambulatory Visit: Payer: Self-pay

## 2019-06-17 ENCOUNTER — Inpatient Hospital Stay (HOSPITAL_COMMUNITY): Payer: Medicaid Other

## 2019-06-17 VITALS — BP 130/84 | HR 62 | Temp 97.5°F | Resp 18

## 2019-06-17 DIAGNOSIS — C541 Malignant neoplasm of endometrium: Secondary | ICD-10-CM

## 2019-06-17 DIAGNOSIS — Z5111 Encounter for antineoplastic chemotherapy: Secondary | ICD-10-CM | POA: Diagnosis not present

## 2019-06-17 DIAGNOSIS — Z95828 Presence of other vascular implants and grafts: Secondary | ICD-10-CM

## 2019-06-17 MED ORDER — PEGFILGRASTIM-CBQV 6 MG/0.6ML ~~LOC~~ SOSY
6.0000 mg | PREFILLED_SYRINGE | Freq: Once | SUBCUTANEOUS | Status: AC
  Administered 2019-06-17: 6 mg via SUBCUTANEOUS

## 2019-06-17 MED ORDER — PEGFILGRASTIM-CBQV 6 MG/0.6ML ~~LOC~~ SOSY
PREFILLED_SYRINGE | SUBCUTANEOUS | Status: AC
  Filled 2019-06-17: qty 0.6

## 2019-06-17 NOTE — Progress Notes (Signed)
Udenyca given per orders. Patient tolerated it well without problems. Vitals stable and discharged home from clinic ambulatory. Follow up as scheduled.  

## 2019-06-21 ENCOUNTER — Ambulatory Visit (HOSPITAL_COMMUNITY): Payer: Medicaid Other

## 2019-06-21 ENCOUNTER — Encounter (HOSPITAL_COMMUNITY): Payer: Medicaid Other

## 2019-06-23 ENCOUNTER — Ambulatory Visit (HOSPITAL_COMMUNITY): Payer: Medicaid Other

## 2019-06-28 ENCOUNTER — Ambulatory Visit (HOSPITAL_COMMUNITY)
Admission: RE | Admit: 2019-06-28 | Discharge: 2019-06-28 | Disposition: A | Discharge status: Home / Self Care | Payer: Medicaid Other | Source: Ambulatory Visit | Attending: Nurse Practitioner | Admitting: Nurse Practitioner

## 2019-06-28 ENCOUNTER — Other Ambulatory Visit: Payer: Self-pay

## 2019-06-28 DIAGNOSIS — N6313 Unspecified lump in the right breast, lower outer quadrant: Secondary | ICD-10-CM | POA: Diagnosis not present

## 2019-06-28 DIAGNOSIS — R928 Other abnormal and inconclusive findings on diagnostic imaging of breast: Secondary | ICD-10-CM | POA: Diagnosis not present

## 2019-06-28 DIAGNOSIS — N6311 Unspecified lump in the right breast, upper outer quadrant: Secondary | ICD-10-CM | POA: Diagnosis not present

## 2019-06-28 DIAGNOSIS — N6489 Other specified disorders of breast: Secondary | ICD-10-CM | POA: Diagnosis not present

## 2019-07-04 ENCOUNTER — Other Ambulatory Visit: Payer: Self-pay

## 2019-07-04 ENCOUNTER — Ambulatory Visit (HOSPITAL_COMMUNITY)
Admission: RE | Admit: 2019-07-04 | Discharge: 2019-07-04 | Disposition: A | Discharge status: Home / Self Care | Payer: Medicaid Other | Source: Ambulatory Visit | Attending: Hematology | Admitting: Hematology

## 2019-07-04 DIAGNOSIS — C541 Malignant neoplasm of endometrium: Secondary | ICD-10-CM | POA: Diagnosis not present

## 2019-07-04 DIAGNOSIS — N134 Hydroureter: Secondary | ICD-10-CM | POA: Diagnosis not present

## 2019-07-04 MED ORDER — IOHEXOL 300 MG/ML  SOLN
100.0000 mL | Freq: Once | INTRAMUSCULAR | Status: AC | PRN
  Administered 2019-07-04: 100 mL via INTRAVENOUS

## 2019-07-06 ENCOUNTER — Inpatient Hospital Stay (HOSPITAL_BASED_OUTPATIENT_CLINIC_OR_DEPARTMENT_OTHER): Payer: Medicaid Other | Admitting: Hematology

## 2019-07-06 ENCOUNTER — Inpatient Hospital Stay (HOSPITAL_COMMUNITY): Payer: Medicaid Other | Attending: Hematology

## 2019-07-06 ENCOUNTER — Inpatient Hospital Stay (HOSPITAL_COMMUNITY): Payer: Medicaid Other

## 2019-07-06 ENCOUNTER — Other Ambulatory Visit: Payer: Self-pay

## 2019-07-06 VITALS — BP 127/86 | HR 84 | Temp 97.5°F | Resp 18 | Wt 223.7 lb

## 2019-07-06 VITALS — BP 124/82 | HR 79 | Temp 97.5°F | Resp 18

## 2019-07-06 DIAGNOSIS — I10 Essential (primary) hypertension: Secondary | ICD-10-CM | POA: Diagnosis not present

## 2019-07-06 DIAGNOSIS — C541 Malignant neoplasm of endometrium: Secondary | ICD-10-CM | POA: Diagnosis not present

## 2019-07-06 DIAGNOSIS — Z90710 Acquired absence of both cervix and uterus: Secondary | ICD-10-CM | POA: Insufficient documentation

## 2019-07-06 DIAGNOSIS — Z95828 Presence of other vascular implants and grafts: Secondary | ICD-10-CM

## 2019-07-06 DIAGNOSIS — Z79899 Other long term (current) drug therapy: Secondary | ICD-10-CM | POA: Diagnosis not present

## 2019-07-06 DIAGNOSIS — C786 Secondary malignant neoplasm of retroperitoneum and peritoneum: Secondary | ICD-10-CM | POA: Diagnosis present

## 2019-07-06 DIAGNOSIS — Z791 Long term (current) use of non-steroidal anti-inflammatories (NSAID): Secondary | ICD-10-CM | POA: Insufficient documentation

## 2019-07-06 DIAGNOSIS — Z90722 Acquired absence of ovaries, bilateral: Secondary | ICD-10-CM | POA: Diagnosis not present

## 2019-07-06 DIAGNOSIS — Z5189 Encounter for other specified aftercare: Secondary | ICD-10-CM | POA: Diagnosis not present

## 2019-07-06 DIAGNOSIS — Z5111 Encounter for antineoplastic chemotherapy: Secondary | ICD-10-CM | POA: Insufficient documentation

## 2019-07-06 LAB — COMPREHENSIVE METABOLIC PANEL
ALT: 21 U/L (ref 0–44)
AST: 15 U/L (ref 15–41)
Albumin: 4.1 g/dL (ref 3.5–5.0)
Alkaline Phosphatase: 109 U/L (ref 38–126)
Anion gap: 8 (ref 5–15)
BUN: 16 mg/dL (ref 6–20)
CO2: 28 mmol/L (ref 22–32)
Calcium: 9.4 mg/dL (ref 8.9–10.3)
Chloride: 104 mmol/L (ref 98–111)
Creatinine, Ser: 0.65 mg/dL (ref 0.44–1.00)
GFR calc Af Amer: >60 mL/min (ref >60)
GFR calc non Af Amer: >60 mL/min (ref >60)
Glucose, Bld: 146 mg/dL — ABNORMAL HIGH (ref 70–99)
Potassium: 3.8 mmol/L (ref 3.5–5.1)
Sodium: 140 mmol/L (ref 135–145)
Total Bilirubin: 0.4 mg/dL (ref 0.3–1.2)
Total Protein: 7.1 g/dL (ref 6.5–8.1)

## 2019-07-06 LAB — CBC WITH DIFFERENTIAL/PLATELET
Abs Immature Granulocytes: 0.01 10*3/uL (ref 0.00–0.07)
Basophils Absolute: 0 10*3/uL (ref 0.0–0.1)
Basophils Relative: 1 %
Eosinophils Absolute: 0 10*3/uL (ref 0.0–0.5)
Eosinophils Relative: 1 %
HCT: 32.4 % — ABNORMAL LOW (ref 36.0–46.0)
Hemoglobin: 10.7 g/dL — ABNORMAL LOW (ref 12.0–15.0)
Immature Granulocytes: 0 %
Lymphocytes Relative: 39 %
Lymphs Abs: 1.9 10*3/uL (ref 0.7–4.0)
MCH: 33.5 pg (ref 26.0–34.0)
MCHC: 33 g/dL (ref 30.0–36.0)
MCV: 101.6 fL — ABNORMAL HIGH (ref 80.0–100.0)
Monocytes Absolute: 0.4 10*3/uL (ref 0.1–1.0)
Monocytes Relative: 9 %
Neutro Abs: 2.3 10*3/uL (ref 1.7–7.7)
Neutrophils Relative %: 50 %
Platelets: 183 10*3/uL (ref 150–400)
RBC: 3.19 MIL/uL — ABNORMAL LOW (ref 3.87–5.11)
RDW: 14.6 % (ref 11.5–15.5)
WBC: 4.7 10*3/uL (ref 4.0–10.5)
nRBC: 0 % (ref 0.0–0.2)

## 2019-07-06 MED ORDER — DIPHENHYDRAMINE HCL 50 MG/ML IJ SOLN
50.0000 mg | Freq: Once | INTRAMUSCULAR | Status: AC
  Administered 2019-07-06: 50 mg via INTRAVENOUS
  Filled 2019-07-06: qty 1

## 2019-07-06 MED ORDER — FAMOTIDINE IN NACL 20-0.9 MG/50ML-% IV SOLN
20.0000 mg | Freq: Once | INTRAVENOUS | Status: AC
  Administered 2019-07-06: 20 mg via INTRAVENOUS
  Filled 2019-07-06: qty 50

## 2019-07-06 MED ORDER — SODIUM CHLORIDE 0.9 % IV SOLN
150.0000 mg | Freq: Once | INTRAVENOUS | Status: AC
  Administered 2019-07-06: 150 mg via INTRAVENOUS
  Filled 2019-07-06: qty 150

## 2019-07-06 MED ORDER — HEPARIN SOD (PORK) LOCK FLUSH 100 UNIT/ML IV SOLN
500.0000 [IU] | Freq: Once | INTRAVENOUS | Status: AC | PRN
  Administered 2019-07-06: 500 [IU]

## 2019-07-06 MED ORDER — SODIUM CHLORIDE 0.9 % IV SOLN
10.0000 mg | Freq: Once | INTRAVENOUS | Status: AC
  Administered 2019-07-06: 10 mg via INTRAVENOUS
  Filled 2019-07-06: qty 10

## 2019-07-06 MED ORDER — SODIUM CHLORIDE 0.9 % IV SOLN
140.0000 mg/m2 | Freq: Once | INTRAVENOUS | Status: AC
  Administered 2019-07-06: 288 mg via INTRAVENOUS
  Filled 2019-07-06: qty 48

## 2019-07-06 MED ORDER — SODIUM CHLORIDE 0.9% FLUSH
10.0000 mL | INTRAVENOUS | Status: DC | PRN
  Administered 2019-07-06: 10 mL

## 2019-07-06 MED ORDER — SODIUM CHLORIDE 0.9 % IV SOLN: Freq: Once | INTRAVENOUS | Status: AC

## 2019-07-06 MED ORDER — PALONOSETRON HCL INJECTION 0.25 MG/5ML
0.2500 mg | Freq: Once | INTRAVENOUS | Status: AC
  Administered 2019-07-06: 0.25 mg via INTRAVENOUS
  Filled 2019-07-06: qty 5

## 2019-07-06 MED ORDER — SODIUM CHLORIDE 0.9 % IV SOLN
862.8000 mg | Freq: Once | INTRAVENOUS | Status: AC
  Administered 2019-07-06: 860 mg via INTRAVENOUS
  Filled 2019-07-06: qty 86

## 2019-07-06 NOTE — Progress Notes (Signed)
Jessica [name], Jessica [name] [id number]   CLINIC:  Medical Oncology/Hematology  PCP:  Patient, No Pcp Per None None   REASON FOR VISIT:  Follow-up for recurrent endometrial cancer  PRIOR THERAPY:  TAH, BSO, bilateral pelvic and para-aortic lymph node dissections on 04/13/2018.  NGS Results: Not done  CURRENT THERAPY: Cabroplatin, Taxol and Aloxi  BRIEF ONCOLOGIC HISTORY:  Oncology History  Endometrial cancer ([place])  03/31/2018 Initial Diagnosis   Endometrial cancer ([place])   12/29/2018 -  Chemotherapy   The patient had palonosetron (ALOXI) injection 0.25 mg, 0.25 mg, Intravenous,  Once, 9 of 10 cycles Administration: 0.25 mg (12/29/2018), 0.25 mg (01/19/2019), 0.25 mg (02/09/2019), 0.25 mg (03/02/2019), 0.25 mg (03/23/2019), 0.25 mg (04/13/2019), 0.25 mg (05/04/2019), 0.25 mg (05/25/2019), 0.25 mg (06/15/2019) pegfilgrastim-cbqv (UDENYCA) injection 6 mg, 6 mg, Subcutaneous, Once, 9 of 10 cycles Administration: 6 mg (12/31/2018), 6 mg (01/20/2019), 6 mg (02/11/2019), 6 mg (03/04/2019), 6 mg (03/25/2019), 6 mg (04/15/2019), 6 mg (05/06/2019), 6 mg (05/27/2019), 6 mg (06/17/2019) CARBOplatin (PARAPLATIN) 860 mg in sodium chloride 0.9 % 500 mL chemo infusion, 860 mg (100 % of original dose 862.8 mg), Intravenous,  Once, 9 of 10 cycles Dose modification:   (original dose 862.8 mg, Cycle 1),   (original dose 862.8 mg, Cycle 2),   (original dose 862.8 mg, Cycle 3),   (original dose 862.8 mg, Cycle 4),   (original dose 862.8 mg, Cycle 5),   (original dose 862.8 mg, Cycle 6),   (original dose 862.8 mg, Cycle 8),   (original dose 862.8 mg, Cycle 8) Administration: 860 mg (12/29/2018), 860 mg (01/19/2019), 860 mg (02/09/2019), 860 mg (03/02/2019), 860 mg (03/23/2019), 860 mg (04/13/2019), 860 mg (05/04/2019), 860 mg (05/25/2019), 860 mg (06/15/2019) fosaprepitant (EMEND) 150 mg in sodium chloride 0.9 % 145 mL IVPB, 150 mg, Intravenous,  Once, 9 of 10 cycles Administration: 150 mg (01/19/2019), 150  mg (02/09/2019), 150 mg (03/02/2019), 150 mg (03/23/2019), 150 mg (04/13/2019), 150 mg (05/04/2019), 150 mg (05/25/2019), 150 mg (06/15/2019) PACLitaxel (TAXOL) 360 mg in sodium chloride 0.9 % 500 mL chemo infusion (> 44m/m2), 175 mg/m2 = 360 mg, Intravenous,  Once, 9 of 10 cycles Dose modification: 140 mg/m2 (80 % of original dose 175 mg/m2, Cycle 7, Reason: Other (see comments), Comment: hand pains), 140 mg/m2 (80 % of original dose 175 mg/m2, Cycle 8, Reason: Other (see comments), Comment: neoropathic pain) Administration: 360 mg (12/29/2018), 360 mg (01/19/2019), 360 mg (02/09/2019), 360 mg (03/02/2019), 360 mg (03/23/2019), 360 mg (04/13/2019), 288 mg (05/04/2019), 288 mg (05/25/2019), 288 mg (06/15/2019)  for chemotherapy treatment.      CANCER STAGING: Cancer Staging No matching staging information was found for the patient.  INTERVAL HISTORY:  Jessica [name] a 59y.o. female, returns for routine follow-up and consideration for next cycle of chemotherapy. [name] last seen on 06/15/2019.  Due for cycle #10 of carboplatin, Taxol and Aloxi today.   Overall, she tells me she has been feeling pretty well. She reports that she had numbness in her toes which is constant, but other than that, no other pains.   Overall, she feels ready for next cycle of chemo today.    REVIEW OF SYSTEMS:  Review of Systems  Constitutional: Positive for fatigue. Negative for appetite change.  Cardiovascular: Negative for leg swelling.  Skin: Positive for itching (around port).  Neurological: Positive for dizziness and numbness (toes).  All other systems reviewed and are negative.   PAST MEDICAL/SURGICAL HISTORY:  Past Medical History:  Diagnosis Date  . Elevated blood pressure reading   . Endometrial cancer ([place])   . Hypertension   . PMB (postmenopausal bleeding)   . Port-A-Cath in place 12/24/2018   Past Surgical History:  Procedure Laterality Date  . IR IMAGING GUIDED PORT INSERTION  12/28/2018  . IR  RADIOLOGIST EVAL & MGMT  06/02/2018  . IR RADIOLOGIST EVAL & MGMT  06/16/2018  . NECK SURGERY  2000   spinal surgery , titanium rod in place   . ROBOTIC ASSISTED TOTAL HYSTERECTOMY WITH BILATERAL SALPINGO OOPHERECTOMY N/A 04/13/2018   Procedure: XI ROBOTIC ASSISTED TOTAL HYSTERECTOMY WITH BILATERAL SALPINGO OOPHORECTOMY;  Surgeon: Everitt Amber, MD;  Location: [facility];  Service: Gynecology;  Laterality: N/A;  . ROBOTIC PELVIC AND PARA-AORTIC LYMPH NODE DISSECTION N/A 04/13/2018   Procedure: XI ROBOTIC PELVIC LYMPHADECTOMY AND PARA-AORTIC LYMPH NODE DISSECTION;  Surgeon: Everitt Amber, MD;  Location: [facility];  Service: Gynecology;  Laterality: N/A;    SOCIAL HISTORY:  Social History   Socioeconomic History  . Marital status: Widowed    Spouse name: Not on file  . Number of children: Not on file  . Years of education: Not on file  . Highest education level: Not on file  Occupational History  . Not on file  Tobacco Use  . Smoking status: Never Smoker  . Smokeless tobacco: Never Used  Vaping Use  . Vaping Use: Never used  Substance and Sexual Activity  . Alcohol use: Yes    Alcohol/week: 4.0 standard drinks    Types: 4 Glasses of wine per week    Comment: weekends  . Drug use: Never  . Sexual activity: Not Currently  Other Topics Concern  . Not on file  Social History Narrative  . Not on file   Social Determinants of Health   Financial Resource Strain:   . Difficulty of Paying Living Expenses:   Food Insecurity:   . Worried About Charity fundraiser in the Last Year:   . Arboriculturist in the Last Year:   Transportation Needs:   . Film/video editor (Medical):   Marland Kitchen Lack of Transportation (Non-Medical):   Physical Activity:   . Days of Exercise per Week:   . Minutes of Exercise per Session:   Stress:   . Feeling of Stress :   Social Connections:   . Frequency of Communication with Friends and Family:   . Frequency of Social Gatherings with Friends and Family:   . Attends  Religious Services:   . Active Member of Clubs or Organizations:   . Attends Archivist Meetings:   Marland Kitchen Marital Status:   Intimate Partner Violence:   . Fear of Current or Ex-Partner:   . Emotionally Abused:   Marland Kitchen Physically Abused:   . Sexually Abused:     FAMILY HISTORY:  Family History  Problem Relation Age of Onset  . Hypertension Father   . Heart disease Father   . Cancer Father        lung  . Hypertension Mother     CURRENT MEDICATIONS:  Current Outpatient Medications  Medication Sig Dispense Refill  . amLODipine (NORVASC) 5 MG tablet Take 1 tablet (5 mg total) by mouth daily. 90 tablet 1  . atorvastatin (LIPITOR) 20 MG tablet Take 1 tablet (20 mg total) by mouth daily. 90 tablet 1  . CARBOPLATIN IV Inject into the vein every 21 ( twenty-one) days.    . CONSTULOSE 10 GM/15ML solution  Take 30 g by mouth daily as needed.     . dicyclomine (BENTYL) 20 MG tablet Take 1 tablet (20 mg total) by mouth every 6 (six) hours. 30 tablet 1  . ibuprofen (ADVIL) 200 MG tablet Take 200 mg by mouth every 6 (six) hours as needed.    . lidocaine-prilocaine (EMLA) cream Apply small amount to port a cath site and cover with plastic wrap one hour prior to chemotherapy appointments 30 g 3  . Multiple Vitamin (MULTIVITAMIN) tablet Take 1 tablet by mouth daily.    Marland Kitchen oxyCODONE-acetaminophen (PERCOCET) 5-325 MG tablet Take 1 tablet by mouth every 8 (eight) hours as needed (pain). 30 tablet 0  . PACLITAXEL IV Inject into the vein every 21 ( twenty-one) days.    . phenazopyridine (PYRIDIUM) 100 MG tablet Take 1 tablet (100 mg total) by mouth 2 (two) times daily as needed for pain. 10 tablet 0  . polyethylene glycol (MIRALAX / GLYCOLAX) 17 g packet Take 17 g by mouth 2 (two) times daily as needed.    . prochlorperazine (COMPAZINE) 10 MG tablet Take 1 tablet (10 mg total) by mouth every 6 (six) hours as needed (Nausea or vomiting). 30 tablet 1   No current facility-administered medications for  this visit.    ALLERGIES:  No Known Allergies  PHYSICAL EXAM:  Performance status (ECOG): 1 - Symptomatic but completely ambulatory  Vitals:   07/06/19 0847  BP: 127/86  Pulse: 84  Resp: 18  Temp: (!) 97.5 F (36.4 C)  SpO2: 98%   Wt Readings from Last 3 Encounters:  07/06/19 223 lb 11.2 oz (101.5 kg)  06/15/19 224 lb 12.8 oz (102 kg)  05/25/19 223 lb (101.2 kg)   Physical Exam Vitals reviewed.  Constitutional:      Appearance: Normal appearance.  Cardiovascular:     Rate and Rhythm: Normal rate and regular rhythm.     Pulses: Normal pulses.     Heart sounds: Normal heart sounds.  Pulmonary:     Effort: Pulmonary effort is normal.     Breath sounds: Normal breath sounds.  Abdominal:     Palpations: Abdomen is soft. There is no mass.     Tenderness: There is no abdominal tenderness.  Musculoskeletal:     Comments: Port on R shoulder  Neurological:     General: No focal deficit present.     Mental Status: She is alert and oriented to person, place, and time.  Psychiatric:        Mood and Affect: Mood normal.        Behavior: Behavior normal.     LABORATORY DATA:  I have reviewed the labs as listed.  CBC Latest Ref Rng & Units 07/06/2019 06/15/2019 05/25/2019  WBC 4.0 - 10.5 K/uL 4.7 4.0 6.1  Hemoglobin 12.0 - 15.0 g/dL 10.7(L) 10.0(L) 10.6(L)  Hematocrit 36 - 46 % 32.4(L) 30.0(L) 31.8(L)  Platelets 150 - 400 K/uL 183 174 187   CMP Latest Ref Rng & Units 07/06/2019 06/15/2019 05/25/2019  Glucose 70 - 99 mg/dL 146(H) 104(H) 96  BUN 6 - 20 mg/dL _0 Creatinine 0.44 - 1.00 mg/dL 0.65 0.56 0.44  Sodium 135 - 145 mmol/L 140 137 137  Potassium 3.5 - 5.1 mmol/L 3.8 3.7 3.8  Chloride 98 - 111 mmol/L 104 103 105  CO2 22 - 32 mmol/L _1 Calcium 8.9 - 10.3 mg/dL 9.4 9.1 9.2  Total Protein 6.5 - 8.1 g/dL 7.1 7.0 7.3  Total  Bilirubin 0.3 - 1.2 mg/dL 0.4 0.6 0.4  Alkaline Phos 38 - 126 U/L 109 124 117  AST 15 - 41 U/L _0 ALT 0 - 44 U/L _1 DIAGNOSTIC IMAGING:  I have independently reviewed the scans and discussed with the patient. DG Forearm Right  Result Date: 06/10/2019 CLINICAL DATA:  Right wrist and arm pain. EXAM: RIGHT FOREARM - 2 VIEW COMPARISON:  No recent. FINDINGS: No acute bony or joint abnormality identified. No evidence of fracture or dislocation. IMPRESSION: No acute abnormality identified. Electronically Signed   By: Marcello Moores  Register   On: 06/10/2019 13:44   NM Bone Scan Whole Body  Result Date: 06/10/2019 CLINICAL DATA:  Back pain for 3 months, history of uterine cancer with abdominal metastases EXAM: NUCLEAR MEDICINE WHOLE BODY BONE SCAN TECHNIQUE: Whole body anterior and posterior images were obtained approximately 3 hours after intravenous injection of radiopharmaceutical. RADIOPHARMACEUTICALS:  21.6 mCi Technetium-42mMDP IV COMPARISON:  05/02/2019 FINDINGS: Anterior and posterior whole body planar images demonstrate normal physiologic excretion of radiotracer within the kidneys and bladder. There is no abnormal focal radiotracer activity to suggest bony metastases. Specifically, no radiotracer activity within the spine to explain the patient's back pain. IMPRESSION: 1. Unremarkable bone scan.  No evidence of bony metastases. Electronically Signed   By: MRanda NgoM.D.   On: 06/10/2019 16:01   CT Abdomen Pelvis W Contrast  Result Date: 07/04/2019 CLINICAL DATA:  Endometrial cancer status post TAH-BSO 04/13/2018 with pelvic and retroperitoneal lymph node dissection and chemotherapy. Restaging. EXAM: CT ABDOMEN AND PELVIS WITH CONTRAST TECHNIQUE: Multidetector CT imaging of the abdomen and pelvis was performed using the standard protocol following bolus administration of intravenous contrast. CONTRAST:  1072mOMNIPAQUE IOHEXOL 300 MG/ML  SOLN COMPARISON:  05/02/2019 CT chest, abdomen and pelvis. FINDINGS: Lower chest: No significant pulmonary nodules or acute consolidative airspace disease. Hepatobiliary: Normal  liver size. No liver mass. Cholelithiasis. No biliary ductal dilatation. Pancreas: Normal, with no mass or duct dilation. Spleen: Normal size. No mass. Adrenals/Urinary Tract: Normal adrenals. Normal kidneys with no hydronephrosis and no renal mass. Normal bladder. Stomach/Bowel: Small hiatal hernia. Otherwise normal nondistended stomach. Normal caliber small bowel with no small bowel wall thickening. Normal appendix. Oral contrast transits to the colon. Moderate colonic stool volume. No large bowel wall thickening, diverticulosis or acute pericolonic fat stranding. Vascular/Lymphatic: Atherosclerotic nonaneurysmal abdominal aorta. Patent portal, splenic, hepatic and renal veins. No pathologically enlarged lymph nodes in the abdomen or pelvis. Reproductive: Status posthysterectomy. No mass at the vaginal cuff. Left pelvic solid 1.7 x 1.1 cm peritoneal implant between the sigmoid colon and left common iliac vessels (series 2/image 71), previously 2.2 x 1.7 cm, decreased. Midline pelvic solid 1.5 x 1.3 cm peritoneal implant (series 2/image 66), previously 1.9 x 1.5 cm, decreased. Other: No pneumoperitoneum, ascites or focal fluid collection. Indistinct 1.5 x 0.8 cm left omental soft tissue implant (series 2/image 42), previously 1.7 x 1.0 cm, decreased. No new peritoneal implants. Musculoskeletal: No aggressive appearing focal osseous lesions. Moderate thoracolumbar spondylosis. IMPRESSION: 1. Continued positive response to therapy. Scattered peritoneal metastases in the pelvis and left omentum are decreased. No new or progressive metastatic disease in the abdomen or pelvis. 2. Moderate colonic stool volume, suggesting constipation. 3. Cholelithiasis. 4. Small hiatal hernia. 5. Aortic Atherosclerosis (ICD10-I70.0). Electronically Signed   By: JaIlona Sorrel.D.   On: 07/04/2019 09:35   [place] LTD UNI LEFT INC AXILLA  Result Date: 06/28/2019 CLINICAL DATA:  Screening recall from baseline mammography for bilateral  breast asymmetries. Patient does have a history of endometrial cancer with abdominal metastases. EXAM: DIGITAL DIAGNOSTIC BILATERAL MAMMOGRAM WITH CAD AND TOMO BILATERAL BREAST ULTRASOUND COMPARISON:  Screening mammogram dated 05/02/2019. ACR Breast Density Category b: There are scattered areas of fibroglandular density. FINDINGS: Spot compression tomograms were performed of the bilateral breasts. The initially questioned asymmetry in the upper outer right breast persists on the additional spot compression tomograms, however spreads out somewhat demonstrating imaging features of suggestive of focally dense fibroglandular tissue. The initially questioned asymmetry in the slightly upper left breast persists on the spot compression MLO tomograms although spreads out more on the spot compression CC tomograms with interspersed fat suggestive of an area of dense fibroglandular tissue. Mammographic images were processed with CAD. Targeted ultrasound of the upper-outer right breast was performed. There is an area of focally dense fibroglandular tissue at the 9:30 position 6 cm from nipple measuring up to 3.6 cm. This is felt to correspond with the asymmetry seen in the upper-outer right breast at mammography. There is an oval circumscribed hypoechoic mass at the 9 o'clock position 6 cm from nipple measuring 0.4 x 0.2 x 0.3 cm, benign in appearance and possibly a complicated cyst or fibroadenoma. Targeted ultrasound of the central upper left breast was performed. No suspicious masses or abnormality seen, only dense fibroglandular tissue identified. This pons with the asymmetry seen in the slightly upper left breast at mammography. IMPRESSION: Probably benign 0.4 cm mass in the right breast at the 9 o'clock position 6 cm from nipple. RECOMMENDATION: Diagnostic mammography of the right breast in 6 months with ultrasound. I have discussed the findings and recommendations with the patient. If applicable, a reminder letter will  be sent to the patient regarding the next appointment. BI-RADS CATEGORY  3: Probably benign. Electronically Signed   By: Everlean Alstrom M.D.   On: 06/28/2019 15:51   US BREAST LTD UNI RIGHT INC AXILLA  Result Date: 06/28/2019 CLINICAL DATA:  Screening recall from baseline mammography for bilateral breast asymmetries. Patient does have a history of endometrial cancer with abdominal metastases. EXAM: DIGITAL DIAGNOSTIC BILATERAL MAMMOGRAM WITH CAD AND TOMO BILATERAL BREAST ULTRASOUND COMPARISON:  Screening mammogram dated 05/02/2019. ACR Breast Density Category b: There are scattered areas of fibroglandular density. FINDINGS: Spot compression tomograms were performed of the bilateral breasts. The initially questioned asymmetry in the upper outer right breast persists on the additional spot compression tomograms, however spreads out somewhat demonstrating imaging features of suggestive of focally dense fibroglandular tissue. The initially questioned asymmetry in the slightly upper left breast persists on the spot compression MLO tomograms although spreads out more on the spot compression CC tomograms with interspersed fat suggestive of an area of dense fibroglandular tissue. Mammographic images were processed with CAD. Targeted ultrasound of the upper-outer right breast was performed. There is an area of focally dense fibroglandular tissue at the 9:30 position 6 cm from nipple measuring up to 3.6 cm. This is felt to correspond with the asymmetry seen in the upper-outer right breast at mammography. There is an oval circumscribed hypoechoic mass at the 9 o'clock position 6 cm from nipple measuring 0.4 x 0.2 x 0.3 cm, benign in appearance and possibly a complicated cyst or fibroadenoma. Targeted ultrasound of the central upper left breast was performed. No suspicious masses or abnormality seen, only dense fibroglandular tissue identified. This pons with the asymmetry seen in the slightly upper left breast at  mammography. IMPRESSION: Probably benign 0.4 cm  mass in the right breast at the 9 o'clock position 6 cm from nipple. RECOMMENDATION: Diagnostic mammography of the right breast in 6 months with ultrasound. I have discussed the findings and recommendations with the patient. If applicable, a reminder letter will be sent to the patient regarding the next appointment. BI-RADS CATEGORY  3: Probably benign. Electronically Signed   By: Everlean Alstrom M.D.   On: 06/28/2019 15:51   MM DIAG BREAST TOMO BILATERAL  Result Date: 06/28/2019 CLINICAL DATA:  Screening recall from baseline mammography for bilateral breast asymmetries. Patient does have a history of endometrial cancer with abdominal metastases. EXAM: DIGITAL DIAGNOSTIC BILATERAL MAMMOGRAM WITH CAD AND TOMO BILATERAL BREAST ULTRASOUND COMPARISON:  Screening mammogram dated 05/02/2019. ACR Breast Density Category b: There are scattered areas of fibroglandular density. FINDINGS: Spot compression tomograms were performed of the bilateral breasts. The initially questioned asymmetry in the upper outer right breast persists on the additional spot compression tomograms, however spreads out somewhat demonstrating imaging features of suggestive of focally dense fibroglandular tissue. The initially questioned asymmetry in the slightly upper left breast persists on the spot compression MLO tomograms although spreads out more on the spot compression CC tomograms with interspersed fat suggestive of an area of dense fibroglandular tissue. Mammographic images were processed with CAD. Targeted ultrasound of the upper-outer right breast was performed. There is an area of focally dense fibroglandular tissue at the 9:30 position 6 cm from nipple measuring up to 3.6 cm. This is felt to correspond with the asymmetry seen in the upper-outer right breast at mammography. There is an oval circumscribed hypoechoic mass at the 9 o'clock position 6 cm from nipple measuring 0.4 x 0.2 x 0.3  cm, benign in appearance and possibly a complicated cyst or fibroadenoma. Targeted ultrasound of the central upper left breast was performed. No suspicious masses or abnormality seen, only dense fibroglandular tissue identified. This pons with the asymmetry seen in the slightly upper left breast at mammography. IMPRESSION: Probably benign 0.4 cm mass in the right breast at the 9 o'clock position 6 cm from nipple. RECOMMENDATION: Diagnostic mammography of the right breast in 6 months with ultrasound. I have discussed the findings and recommendations with the patient. If applicable, a reminder letter will be sent to the patient regarding the next appointment. BI-RADS CATEGORY  3: Probably benign. Electronically Signed   By: Everlean Alstrom M.D.   On: 06/28/2019 15:51     ASSESSMENT:  1.  Recurrent endometrial carcinosarcoma: -TAH, BSO, bilateral pelvic and para-aortic lymph node resections on 04/13/2018. -Pathology showed carcinosarcoma (malignant mixed mullerian tumor) is arising in an endometrial type polyp with no myometrial invasion identified.  High-grade.  0/39 lymph nodes positive.  PT1APN0, FIGO stage Ia.  MMR normal.  MSI-stable. -CTAP on 12/09/2018 for abdominal pain showed extensive peritoneal carcinomatosis and large soft tissue mass in the pelvis. -Biopsy of the omental mass on 12/28/2018 shows poorly differentiated carcinoma consistent with her prior malignancy. -Carboplatin and paclitaxel started on 12/29/2018. -CT scan on 05/02/2019 showed peritoneal implants in the low central small bowel mesentery and left pelvic sidewall have decreased in size measuring 1.5 x 1.9 cm and 1.7 x 2.2 cm.  Soft tissue nodule in the left lateral omentum measures 1 x 1.7 cm with no evidence of metastatic disease. -Continuation of chemotherapy until complete response was recommended. -CT AP on 07/04/2019 showed continued positive response to therapy with scattered peritoneal metastasis in the pelvis and left omentum  decreased in size.  No new metastatic disease.  2.  Breast abnormality: -Mammogram on 05/02/2019 shows BI-RADS Category 0.  She reportedly took Covid shot 1 to 2 weeks prior to the mammogram.  Further scans rescheduled on 06/21/2019.   PLAN:  1.  Recurrent endometrial carcinosarcoma: -I discussed CT scan findings and reviewed with her. -I have also reviewed her labs.  She will proceed with another cycle today. -We will continue dose reduction.  I plan to rescan her after 3-4 cycles.  She will be seen back in 3 weeks for follow-up. -Right lateral abdominal wall pain is stable.  2.  Leg pains: -Continue Percocet 5/325 as needed.  3.  Hypertension: -Continue Norvasc 5 mg daily.   Orders placed this encounter:  No orders of the defined types were placed in this encounter.    [name], MD [place] [phone]   I, Milinda Antis, am acting as a scribe for Dr. Sanda Linger.  I, [name] MD, have reviewed the above documentation for accuracy and completeness, and I agree with the above.

## 2019-07-06 NOTE — Progress Notes (Signed)
Patient tolerated chemotherapy with no complaints voiced.  Side effects with management reviewed with understanding verbalized.  Port site clean and dry with no bruising or swelling noted at site.  Good blood return noted before and after administration of chemotherapy.  Band aid applied.  Patient left ambulatory with VSS and no s/s of distress noted.  

## 2019-07-06 NOTE — Patient Instructions (Addendum)
[place] at [facility] Discharge Instructions  You were seen today by Dr. Delton Coombes. He went over your recent results and scans. Please continue monitoring for additional symptoms of numbness or any other side effects from the chemotherapy. Dr. Delton Coombes will see you back in 3 weeks for labs and follow up.   Thank you for choosing [place] at [facility] to provide your oncology and hematology care.  To afford each patient quality time with our provider, please arrive at least 15 minutes before your scheduled appointment time.   If you have a lab appointment with the [facility] please come in thru the Main Entrance and check in at the main information desk  You need to re-schedule your appointment should you arrive 10 or more minutes late.  We strive to give you quality time with our providers, and arriving late affects you and other patients whose appointments are after yours.  Also, if you no show three or more times for appointments you may be dismissed from the clinic at the providers discretion.     Again, thank you for choosing [facility].  Our hope is that these requests will decrease the amount of time that you wait before being seen by our physicians.       _____________________________________________________________  Should you have questions after your visit to [facility], please contact our office at [phone] between the hours of 8:00 a.m. and 4:30 p.m.  Voicemails left after 4:00 p.m. will not be returned until the following business day.  For prescription refill requests, have your pharmacy contact our office and allow 72 hours.    Cancer Center Support Programs:   > Cancer Support Group  2nd Tuesday of the month 1pm-2pm, Journey Room

## 2019-07-06 NOTE — Progress Notes (Signed)
Patient has been assessed, vital signs and labs have been reviewed by Dr. Katragadda. ANC, Creatinine, LFTs, and Platelets are within treatment parameters per Dr. Katragadda. The patient is good to proceed with treatment at this time.  

## 2019-07-07 LAB — CA 125: Cancer Antigen (CA) 125: 6.7 U/mL (ref 0.0–38.1)

## 2019-07-08 ENCOUNTER — Inpatient Hospital Stay (HOSPITAL_COMMUNITY): Payer: Medicaid Other

## 2019-07-08 ENCOUNTER — Encounter (HOSPITAL_COMMUNITY): Payer: Self-pay

## 2019-07-08 ENCOUNTER — Other Ambulatory Visit: Payer: Self-pay

## 2019-07-08 VITALS — BP 119/61 | HR 65 | Temp 98.0°F | Resp 18

## 2019-07-08 DIAGNOSIS — Z5111 Encounter for antineoplastic chemotherapy: Secondary | ICD-10-CM | POA: Diagnosis not present

## 2019-07-08 DIAGNOSIS — C541 Malignant neoplasm of endometrium: Secondary | ICD-10-CM

## 2019-07-08 DIAGNOSIS — Z95828 Presence of other vascular implants and grafts: Secondary | ICD-10-CM

## 2019-07-08 MED ORDER — PEGFILGRASTIM-CBQV 6 MG/0.6ML ~~LOC~~ SOSY
6.0000 mg | PREFILLED_SYRINGE | Freq: Once | SUBCUTANEOUS | Status: AC
  Administered 2019-07-08: 6 mg via SUBCUTANEOUS
  Filled 2019-07-08: qty 0.6

## 2019-07-08 NOTE — Progress Notes (Signed)
Pt given injection in left arm. Pt tolerated injection well with no complaints. Pt stable and discharged home ambulatory. Pt to return as scheduled.

## 2019-07-11 ENCOUNTER — Other Ambulatory Visit: Payer: Self-pay

## 2019-07-11 ENCOUNTER — Ambulatory Visit (HOSPITAL_COMMUNITY)
Admission: RE | Admit: 2019-07-11 | Discharge: 2019-07-11 | Disposition: A | Discharge status: Home / Self Care | Payer: Medicaid Other | Source: Ambulatory Visit | Attending: Hematology | Admitting: Hematology

## 2019-07-11 DIAGNOSIS — M79631 Pain in right forearm: Secondary | ICD-10-CM | POA: Diagnosis not present

## 2019-07-11 DIAGNOSIS — M7989 Other specified soft tissue disorders: Secondary | ICD-10-CM | POA: Diagnosis not present

## 2019-07-11 MED ORDER — IOHEXOL 300 MG/ML  SOLN
75.0000 mL | Freq: Once | INTRAMUSCULAR | Status: AC | PRN
  Administered 2019-07-11: 75 mL via INTRAVENOUS

## 2019-07-27 ENCOUNTER — Ambulatory Visit: Payer: Self-pay | Admitting: Physician Assistant

## 2019-07-29 ENCOUNTER — Encounter (HOSPITAL_COMMUNITY): Payer: Self-pay

## 2019-07-29 ENCOUNTER — Inpatient Hospital Stay (HOSPITAL_COMMUNITY): Payer: Medicaid Other

## 2019-07-29 ENCOUNTER — Inpatient Hospital Stay (HOSPITAL_COMMUNITY): Payer: Medicaid Other | Attending: Hematology

## 2019-07-29 ENCOUNTER — Inpatient Hospital Stay (HOSPITAL_BASED_OUTPATIENT_CLINIC_OR_DEPARTMENT_OTHER): Payer: Medicaid Other | Admitting: Hematology

## 2019-07-29 ENCOUNTER — Encounter (HOSPITAL_COMMUNITY): Payer: Self-pay | Admitting: Hematology

## 2019-07-29 ENCOUNTER — Other Ambulatory Visit: Payer: Self-pay

## 2019-07-29 VITALS — BP 146/83 | HR 82 | Temp 97.3°F | Resp 18 | Wt 224.2 lb

## 2019-07-29 VITALS — BP 115/61 | HR 72 | Temp 97.5°F | Resp 18 | Ht 64.0 in

## 2019-07-29 DIAGNOSIS — C541 Malignant neoplasm of endometrium: Secondary | ICD-10-CM

## 2019-07-29 DIAGNOSIS — Z791 Long term (current) use of non-steroidal anti-inflammatories (NSAID): Secondary | ICD-10-CM | POA: Insufficient documentation

## 2019-07-29 DIAGNOSIS — G629 Polyneuropathy, unspecified: Secondary | ICD-10-CM | POA: Diagnosis not present

## 2019-07-29 DIAGNOSIS — Z90722 Acquired absence of ovaries, bilateral: Secondary | ICD-10-CM | POA: Diagnosis not present

## 2019-07-29 DIAGNOSIS — Z90710 Acquired absence of both cervix and uterus: Secondary | ICD-10-CM | POA: Diagnosis not present

## 2019-07-29 DIAGNOSIS — C786 Secondary malignant neoplasm of retroperitoneum and peritoneum: Secondary | ICD-10-CM | POA: Diagnosis present

## 2019-07-29 DIAGNOSIS — Z5111 Encounter for antineoplastic chemotherapy: Secondary | ICD-10-CM | POA: Diagnosis present

## 2019-07-29 DIAGNOSIS — Z5189 Encounter for other specified aftercare: Secondary | ICD-10-CM | POA: Diagnosis not present

## 2019-07-29 DIAGNOSIS — I10 Essential (primary) hypertension: Secondary | ICD-10-CM | POA: Diagnosis not present

## 2019-07-29 DIAGNOSIS — Z79899 Other long term (current) drug therapy: Secondary | ICD-10-CM | POA: Insufficient documentation

## 2019-07-29 DIAGNOSIS — K449 Diaphragmatic hernia without obstruction or gangrene: Secondary | ICD-10-CM | POA: Insufficient documentation

## 2019-07-29 DIAGNOSIS — Z95828 Presence of other vascular implants and grafts: Secondary | ICD-10-CM

## 2019-07-29 LAB — COMPREHENSIVE METABOLIC PANEL
ALT: 20 U/L (ref 0–44)
AST: 19 U/L (ref 15–41)
Albumin: 4 g/dL (ref 3.5–5.0)
Alkaline Phosphatase: 104 U/L (ref 38–126)
Anion gap: 9 (ref 5–15)
BUN: 17 mg/dL (ref 6–20)
CO2: 24 mmol/L (ref 22–32)
Calcium: 9 mg/dL (ref 8.9–10.3)
Chloride: 105 mmol/L (ref 98–111)
Creatinine, Ser: 0.53 mg/dL (ref 0.44–1.00)
GFR calc Af Amer: >60 mL/min (ref >60)
GFR calc non Af Amer: >60 mL/min (ref >60)
Glucose, Bld: 130 mg/dL — ABNORMAL HIGH (ref 70–99)
Potassium: 3.8 mmol/L (ref 3.5–5.1)
Sodium: 138 mmol/L (ref 135–145)
Total Bilirubin: 0.5 mg/dL (ref 0.3–1.2)
Total Protein: 7 g/dL (ref 6.5–8.1)

## 2019-07-29 LAB — CBC WITH DIFFERENTIAL/PLATELET
Abs Immature Granulocytes: 0.02 10*3/uL (ref 0.00–0.07)
Basophils Absolute: 0 10*3/uL (ref 0.0–0.1)
Basophils Relative: 1 %
Eosinophils Absolute: 0.1 10*3/uL (ref 0.0–0.5)
Eosinophils Relative: 1 %
HCT: 29.9 % — ABNORMAL LOW (ref 36.0–46.0)
Hemoglobin: 9.9 g/dL — ABNORMAL LOW (ref 12.0–15.0)
Immature Granulocytes: 0 %
Lymphocytes Relative: 34 %
Lymphs Abs: 1.5 10*3/uL (ref 0.7–4.0)
MCH: 34.5 pg — ABNORMAL HIGH (ref 26.0–34.0)
MCHC: 33.1 g/dL (ref 30.0–36.0)
MCV: 104.2 fL — ABNORMAL HIGH (ref 80.0–100.0)
Monocytes Absolute: 0.5 10*3/uL (ref 0.1–1.0)
Monocytes Relative: 12 %
Neutro Abs: 2.3 10*3/uL (ref 1.7–7.7)
Neutrophils Relative %: 52 %
Platelets: 207 10*3/uL (ref 150–400)
RBC: 2.87 MIL/uL — ABNORMAL LOW (ref 3.87–5.11)
RDW: 14.9 % (ref 11.5–15.5)
WBC: 4.5 10*3/uL (ref 4.0–10.5)
nRBC: 0 % (ref 0.0–0.2)

## 2019-07-29 MED ORDER — SODIUM CHLORIDE 0.9 % IV SOLN: Freq: Once | INTRAVENOUS | Status: AC

## 2019-07-29 MED ORDER — FAMOTIDINE IN NACL 20-0.9 MG/50ML-% IV SOLN
20.0000 mg | Freq: Once | INTRAVENOUS | Status: AC
  Administered 2019-07-29: 20 mg via INTRAVENOUS
  Filled 2019-07-29: qty 50

## 2019-07-29 MED ORDER — PALONOSETRON HCL INJECTION 0.25 MG/5ML
0.2500 mg | Freq: Once | INTRAVENOUS | Status: AC
  Administered 2019-07-29: 0.25 mg via INTRAVENOUS
  Filled 2019-07-29: qty 5

## 2019-07-29 MED ORDER — SODIUM CHLORIDE 0.9% FLUSH
10.0000 mL | INTRAVENOUS | Status: DC | PRN
  Administered 2019-07-29: 10 mL

## 2019-07-29 MED ORDER — SODIUM CHLORIDE 0.9 % IV SOLN
862.8000 mg | Freq: Once | INTRAVENOUS | Status: AC
  Administered 2019-07-29: 860 mg via INTRAVENOUS
  Filled 2019-07-29: qty 86

## 2019-07-29 MED ORDER — DIPHENHYDRAMINE HCL 50 MG/ML IJ SOLN
50.0000 mg | Freq: Once | INTRAMUSCULAR | Status: AC
  Administered 2019-07-29: 50 mg via INTRAVENOUS
  Filled 2019-07-29: qty 1

## 2019-07-29 MED ORDER — SODIUM CHLORIDE 0.9 % IV SOLN
150.0000 mg | Freq: Once | INTRAVENOUS | Status: AC
  Administered 2019-07-29: 150 mg via INTRAVENOUS
  Filled 2019-07-29: qty 150

## 2019-07-29 MED ORDER — SODIUM CHLORIDE 0.9 % IV SOLN
10.0000 mg | Freq: Once | INTRAVENOUS | Status: AC
  Administered 2019-07-29: 10 mg via INTRAVENOUS
  Filled 2019-07-29: qty 10

## 2019-07-29 MED ORDER — HEPARIN SOD (PORK) LOCK FLUSH 100 UNIT/ML IV SOLN
500.0000 [IU] | Freq: Once | INTRAVENOUS | Status: AC | PRN
  Administered 2019-07-29: 500 [IU]

## 2019-07-29 MED ORDER — SODIUM CHLORIDE 0.9 % IV SOLN
140.0000 mg/m2 | Freq: Once | INTRAVENOUS | Status: AC
  Administered 2019-07-29: 288 mg via INTRAVENOUS
  Filled 2019-07-29: qty 48

## 2019-07-29 MED ORDER — GABAPENTIN 300 MG PO CAPS
300.0000 mg | ORAL_CAPSULE | Freq: Three times a day (TID) | ORAL | 3 refills | Status: DC
Start: 2019-07-29 — End: 2019-12-19

## 2019-07-29 NOTE — Progress Notes (Signed)
Ok to treat today with no changes verbal order Dr. Delton Coombes.   Patient tolerated chemotherapy with no complaints voiced.  Side effects with management reviewed with understanding verbalized.  Port site clean and dry with no bruising or swelling noted at site.  Good blood return noted before and after administration of chemotherapy.  Band aid applied.  Patient left in satisfactory condition with VSS and no s/s of distress noted.

## 2019-07-29 NOTE — Patient Instructions (Signed)
[place] at [facility] Discharge Instructions  You were seen today by Dr. Delton Coombes. He went over your recent results. You will be prescribed gabapentin 300 mg for your tingling; please take it two to three times daily. Dr. Delton Coombes will see you back in 3 weeks for labs and follow up.   Thank you for choosing [place] at [facility] to provide your oncology and hematology care.  To afford each patient quality time with our provider, please arrive at least 15 minutes before your scheduled appointment time.   If you have a lab appointment with the [facility] please come in thru the Main Entrance and check in at the main information desk  You need to re-schedule your appointment should you arrive 10 or more minutes late.  We strive to give you quality time with our providers, and arriving late affects you and other patients whose appointments are after yours.  Also, if you no show three or more times for appointments you may be dismissed from the clinic at the providers discretion.     Again, thank you for choosing [facility].  Our hope is that these requests will decrease the amount of time that you wait before being seen by our physicians.       _____________________________________________________________  Should you have questions after your visit to [facility], please contact our office at [phone] between the hours of 8:00 a.m. and 4:30 p.m.  Voicemails left after 4:00 p.m. will not be returned until the following business day.  For prescription refill requests, have your pharmacy contact our office and allow 72 hours.    Cancer Center Support Programs:   > Cancer Support Group  2nd Tuesday of the month 1pm-2pm, Journey Room

## 2019-07-29 NOTE — Progress Notes (Signed)
[place], [place] [address]   CLINIC:  Medical Oncology/Hematology  PCP:  Patient, No Pcp Per None None   REASON FOR VISIT:  Follow-up for recurrent endometrial cancer  PRIOR THERAPY: TAH, BSO, bilateral pelvic and para-aortic lymph node dissections on 04/13/2018   NGS Results: Not done  CURRENT THERAPY: Carboplatin, paclitaxel and Aloxi  BRIEF ONCOLOGIC HISTORY:  Oncology History  Endometrial cancer ([place])  03/31/2018 Initial Diagnosis   Endometrial cancer ([place])   12/29/2018 -  Chemotherapy   The patient had palonosetron (ALOXI) injection 0.25 mg, 0.25 mg, Intravenous,  Once, 10 of 12 cycles Administration: 0.25 mg (12/29/2018), 0.25 mg (01/19/2019), 0.25 mg (02/09/2019), 0.25 mg (03/02/2019), 0.25 mg (03/23/2019), 0.25 mg (04/13/2019), 0.25 mg (05/04/2019), 0.25 mg (05/25/2019), 0.25 mg (06/15/2019), 0.25 mg (07/06/2019) pegfilgrastim-cbqv (UDENYCA) injection 6 mg, 6 mg, Subcutaneous, Once, 10 of 12 cycles Administration: 6 mg (12/31/2018), 6 mg (01/20/2019), 6 mg (02/11/2019), 6 mg (03/04/2019), 6 mg (03/25/2019), 6 mg (04/15/2019), 6 mg (05/06/2019), 6 mg (05/27/2019), 6 mg (06/17/2019), 6 mg (07/08/2019) CARBOplatin (PARAPLATIN) 860 mg in sodium chloride 0.9 % 500 mL chemo infusion, 860 mg (100 % of original dose 862.8 mg), Intravenous,  Once, 10 of 12 cycles Dose modification:   (original dose 862.8 mg, Cycle 1),   (original dose 862.8 mg, Cycle 2),   (original dose 862.8 mg, Cycle 3),   (original dose 862.8 mg, Cycle 4),   (original dose 862.8 mg, Cycle 5),   (original dose 862.8 mg, Cycle 6),   (original dose 862.8 mg, Cycle 8),   (original dose 862.8 mg, Cycle 8) Administration: 860 mg (12/29/2018), 860 mg (01/19/2019), 860 mg (02/09/2019), 860 mg (03/02/2019), 860 mg (03/23/2019), 860 mg (04/13/2019), 860 mg (05/04/2019), 860 mg (05/25/2019), 860 mg (06/15/2019), 860 mg (07/06/2019) fosaprepitant (EMEND) 150 mg in sodium chloride 0.9 % 145 mL IVPB, 150 mg,  Intravenous,  Once, 10 of 12 cycles Administration: 150 mg (01/19/2019), 150 mg (02/09/2019), 150 mg (03/02/2019), 150 mg (03/23/2019), 150 mg (04/13/2019), 150 mg (05/04/2019), 150 mg (05/25/2019), 150 mg (06/15/2019), 150 mg (07/06/2019) PACLitaxel (TAXOL) 360 mg in sodium chloride 0.9 % 500 mL chemo infusion (> 73m/m2), 175 mg/m2 = 360 mg, Intravenous,  Once, 10 of 12 cycles Dose modification: 140 mg/m2 (80 % of original dose 175 mg/m2, Cycle 7, Reason: Other (see comments), Comment: hand pains), 140 mg/m2 (80 % of original dose 175 mg/m2, Cycle 8, Reason: Other (see comments), Comment: neoropathic pain) Administration: 360 mg (12/29/2018), 360 mg (01/19/2019), 360 mg (02/09/2019), 360 mg (03/02/2019), 360 mg (03/23/2019), 360 mg (04/13/2019), 288 mg (05/04/2019), 288 mg (05/25/2019), 288 mg (06/15/2019), 288 mg (07/06/2019)  for chemotherapy treatment.      CANCER STAGING: Cancer Staging No matching staging information was found for the patient.  INTERVAL HISTORY:  Jessica [name] a 59y.o. female, returns for routine follow-up and consideration for next cycle of chemotherapy. Jessica [name] last seen on 07/06/2019.  Due for cycle #11 of carboplatin, paclitaxel and Aloxi today.   Overall, she tells me she has been feeling pretty well. Today she reports that her neuropathy in the toes has worsened, with constant tingling and swelling in left toes more than right but no pain or numbness. The tingling is worst at night. She takes a few Percocet for her back pain, but otherwise it is well controlled. She is having normal BM, with some nausea but no vomiting or diarrhea. She has never taken vitamin B12 before.  Overall, she feels ready for next cycle of chemo today.    REVIEW OF SYSTEMS:  Review of Systems  Constitutional: Positive for appetite change (mildly decreased) and fatigue (mild).  Respiratory: Positive for cough (dry).   Cardiovascular: Positive for leg swelling (bilat toes).  Gastrointestinal:  Positive for nausea (intermittent). Negative for diarrhea and vomiting.  Neurological: Positive for numbness (+ tingling in toes).  All other systems reviewed and are negative.   PAST MEDICAL/SURGICAL HISTORY:  Past Medical History:  Diagnosis Date  . Elevated blood pressure reading   . Endometrial cancer ([place])   . Hypertension   . PMB (postmenopausal bleeding)   . Port-A-Cath in place 12/24/2018   Past Surgical History:  Procedure Laterality Date  . IR IMAGING GUIDED PORT INSERTION  12/28/2018  . IR RADIOLOGIST EVAL & MGMT  06/02/2018  . IR RADIOLOGIST EVAL & MGMT  06/16/2018  . NECK SURGERY  2000   spinal surgery , titanium rod in place   . ROBOTIC ASSISTED TOTAL HYSTERECTOMY WITH BILATERAL SALPINGO OOPHERECTOMY N/A 04/13/2018   Procedure: XI ROBOTIC ASSISTED TOTAL HYSTERECTOMY WITH BILATERAL SALPINGO OOPHORECTOMY;  Surgeon: Everitt Amber, MD;  Location: [facility];  Service: Gynecology;  Laterality: N/A;  . ROBOTIC PELVIC AND PARA-AORTIC LYMPH NODE DISSECTION N/A 04/13/2018   Procedure: XI ROBOTIC PELVIC LYMPHADECTOMY AND PARA-AORTIC LYMPH NODE DISSECTION;  Surgeon: Everitt Amber, MD;  Location: [facility];  Service: Gynecology;  Laterality: N/A;    SOCIAL HISTORY:  Social History   Socioeconomic History  . Marital status: Widowed    Spouse name: Not on file  . Number of children: Not on file  . Years of education: Not on file  . Highest education level: Not on file  Occupational History  . Not on file  Tobacco Use  . Smoking status: Never Smoker  . Smokeless tobacco: Never Used  Vaping Use  . Vaping Use: Never used  Substance and Sexual Activity  . Alcohol use: Yes    Alcohol/week: 4.0 standard drinks    Types: 4 Glasses of wine per week    Comment: weekends  . Drug use: Never  . Sexual activity: Not Currently  Other Topics Concern  . Not on file  Social History Narrative  . Not on file   Social Determinants of Health   Financial Resource Strain:   . Difficulty of Paying  Living Expenses:   Food Insecurity:   . Worried About Charity fundraiser in the Last Year:   . Arboriculturist in the Last Year:   Transportation Needs:   . Film/video editor (Medical):   Marland Kitchen Lack of Transportation (Non-Medical):   Physical Activity:   . Days of Exercise per Week:   . Minutes of Exercise per Session:   Stress:   . Feeling of Stress :   Social Connections:   . Frequency of Communication with Friends and Family:   . Frequency of Social Gatherings with Friends and Family:   . Attends Religious Services:   . Active Member of Clubs or Organizations:   . Attends Archivist Meetings:   Marland Kitchen Marital Status:   Intimate Partner Violence:   . Fear of Current or Ex-Partner:   . Emotionally Abused:   Marland Kitchen Physically Abused:   . Sexually Abused:     FAMILY HISTORY:  Family History  Problem Relation Age of Onset  . Hypertension Father   . Heart disease Father   . Cancer Father  lung  . Hypertension Mother     CURRENT MEDICATIONS:  Current Outpatient Medications  Medication Sig Dispense Refill  . amLODipine (NORVASC) 5 MG tablet Take 1 tablet (5 mg total) by mouth daily. 90 tablet 1  . atorvastatin (LIPITOR) 20 MG tablet Take 1 tablet (20 mg total) by mouth daily. 90 tablet 1  . CARBOPLATIN IV Inject into the vein every 21 ( twenty-one) days.    . CONSTULOSE 10 GM/15ML solution Take 30 g by mouth daily as needed.  (Patient not taking: Reported on 07/06/2019)    . dicyclomine (BENTYL) 20 MG tablet Take 1 tablet (20 mg total) by mouth every 6 (six) hours. (Patient not taking: Reported on 07/29/2019) 30 tablet 1  . ibuprofen (ADVIL) 200 MG tablet Take 200 mg by mouth every 6 (six) hours as needed. (Patient not taking: Reported on 07/06/2019)    . lidocaine-prilocaine (EMLA) cream Apply small amount to port a cath site and cover with plastic wrap one hour prior to chemotherapy appointments 30 g 3  . Multiple Vitamin (MULTIVITAMIN) tablet Take 1 tablet by mouth  daily.    Marland Kitchen oxyCODONE-acetaminophen (PERCOCET) 5-325 MG tablet Take 1 tablet by mouth every 8 (eight) hours as needed (pain). (Patient not taking: Reported on 07/06/2019) 30 tablet 0  . PACLITAXEL IV Inject into the vein every 21 ( twenty-one) days.    . phenazopyridine (PYRIDIUM) 100 MG tablet Take 1 tablet (100 mg total) by mouth 2 (two) times daily as needed for pain. (Patient not taking: Reported on 07/06/2019) 10 tablet 0  . polyethylene glycol (MIRALAX / GLYCOLAX) 17 g packet Take 17 g by mouth 2 (two) times daily as needed. (Patient not taking: Reported on 07/06/2019)    . prochlorperazine (COMPAZINE) 10 MG tablet Take 1 tablet (10 mg total) by mouth every 6 (six) hours as needed (Nausea or vomiting). (Patient not taking: Reported on 07/06/2019) 30 tablet 1   No current facility-administered medications for this visit.    ALLERGIES:  No Known Allergies  PHYSICAL EXAM:  Performance status (ECOG): 1 - Symptomatic but completely ambulatory  Vitals:   07/29/19 0750  BP: (!) 146/83  Pulse: 82  Resp: 18  Temp: (!) 97.3 F (36.3 C)  SpO2: 100%   Wt Readings from Last 3 Encounters:  07/29/19 101.7 kg (224 lb 3.2 oz)  07/06/19 101.5 kg (223 lb 11.2 oz)  06/15/19 102 kg (224 lb 12.8 oz)   Physical Exam Vitals reviewed.  Constitutional:      Appearance: Normal appearance. She is obese.  Neurological:     General: No focal deficit present.     Mental Status: She is alert and oriented to person, place, and time.  Psychiatric:        Mood and Affect: Mood normal.        Behavior: Behavior normal.     LABORATORY DATA:  I have reviewed the labs as listed.  CBC Latest Ref Rng & Units 07/29/2019 07/06/2019 06/15/2019  WBC 4.0 - 10.5 K/uL 4.5 4.7 4.0  Hemoglobin 12.0 - 15.0 g/dL 9.9(L) 10.7(L) 10.0(L)  Hematocrit 36 - 46 % 29.9(L) 32.4(L) 30.0(L)  Platelets 150 - 400 K/uL 207 183 174   CMP Latest Ref Rng & Units 07/06/2019 06/15/2019 05/25/2019  Glucose 70 - 99 mg/dL 146(H) 104(H) 96    BUN 6 - 20 mg/dL _0 Creatinine 0.44 - 1.00 mg/dL 0.65 0.56 0.44  Sodium 135 - 145 mmol/L 140 137 137  Potassium 3.5 -  5.1 mmol/L 3.8 3.7 3.8  Chloride 98 - 111 mmol/L 104 103 105  CO2 22 - 32 mmol/L _0 Calcium 8.9 - 10.3 mg/dL 9.4 9.1 9.2  Total Protein 6.5 - 8.1 g/dL 7.1 7.0 7.3  Total Bilirubin 0.3 - 1.2 mg/dL 0.4 0.6 0.4  Alkaline Phos 38 - 126 U/L 109 124 117  AST 15 - 41 U/L _1 ALT 0 - 44 U/L _2 DIAGNOSTIC IMAGING:  I have independently reviewed the scans and discussed with the patient. CT Abdomen Pelvis W Contrast  Result Date: 07/04/2019 CLINICAL DATA:  Endometrial cancer status post TAH-BSO 04/13/2018 with pelvic and retroperitoneal lymph node dissection and chemotherapy. Restaging. EXAM: CT ABDOMEN AND PELVIS WITH CONTRAST TECHNIQUE: Multidetector CT imaging of the abdomen and pelvis was performed using the standard protocol following bolus administration of intravenous contrast. CONTRAST:  175m OMNIPAQUE IOHEXOL 300 MG/ML  SOLN COMPARISON:  05/02/2019 CT chest, abdomen and pelvis. FINDINGS: Lower chest: No significant pulmonary nodules or acute consolidative airspace disease. Hepatobiliary: Normal liver size. No liver mass. Cholelithiasis. No biliary ductal dilatation. Pancreas: Normal, with no mass or duct dilation. Spleen: Normal size. No mass. Adrenals/Urinary Tract: Normal adrenals. Normal kidneys with no hydronephrosis and no renal mass. Normal bladder. Stomach/Bowel: Small hiatal hernia. Otherwise normal nondistended stomach. Normal caliber small bowel with no small bowel wall thickening. Normal appendix. Oral contrast transits to the colon. Moderate colonic stool volume. No large bowel wall thickening, diverticulosis or acute pericolonic fat stranding. Vascular/Lymphatic: Atherosclerotic nonaneurysmal abdominal aorta. Patent portal, splenic, hepatic and renal veins. No pathologically enlarged lymph nodes in the abdomen or pelvis. Reproductive:  Status posthysterectomy. No mass at the vaginal cuff. Left pelvic solid 1.7 x 1.1 cm peritoneal implant between the sigmoid colon and left common iliac vessels (series 2/image 71), previously 2.2 x 1.7 cm, decreased. Midline pelvic solid 1.5 x 1.3 cm peritoneal implant (series 2/image 66), previously 1.9 x 1.5 cm, decreased. Other: No pneumoperitoneum, ascites or focal fluid collection. Indistinct 1.5 x 0.8 cm left omental soft tissue implant (series 2/image 42), previously 1.7 x 1.0 cm, decreased. No new peritoneal implants. Musculoskeletal: No aggressive appearing focal osseous lesions. Moderate thoracolumbar spondylosis. IMPRESSION: 1. Continued positive response to therapy. Scattered peritoneal metastases in the pelvis and left omentum are decreased. No new or progressive metastatic disease in the abdomen or pelvis. 2. Moderate colonic stool volume, suggesting constipation. 3. Cholelithiasis. 4. Small hiatal hernia. 5. Aortic Atherosclerosis (ICD10-I70.0). Electronically Signed   By: JIlona SorrelM.D.   On: 07/04/2019 09:35   CT FOREARM RIGHT W CONTRAST  Result Date: 07/12/2019 CLINICAL DATA:  Distal forearm pain and swelling. No known injury. History of endometrial cancer. EXAM: CT OF THE UPPER RIGHT EXTREMITY WITH CONTRAST TECHNIQUE: Multidetector CT imaging of the upper right extremity was performed according to the standard protocol following intravenous contrast administration. COMPARISON:  Right forearm x-ray 06/10/2019. Nuclear medicine bone scan 06/10/2019 CONTRAST:  713mOMNIPAQUE IOHEXOL 300 MG/ML  SOLN FINDINGS: Bones/Joint/Cartilage No acute fracture. No dislocation. No lytic or sclerotic bone lesion. No cortical destruction. Ligaments Suboptimally assessed by CT. Muscles and Tendons Myotendinous structures appear unremarkable is neck by CT. Soft tissues No soft tissue mass or fluid collection.  No abnormal enhancement. IMPRESSION: Unremarkable CT of the right upper extremity. No findings to  explain patient's symptoms. Electronically Signed   By: NiDavina Poke.O.   On: 07/12/2019 17:03     ASSESSMENT:  1. Recurrent endometrial carcinosarcoma: -  TAH, BSO, bilateral pelvic and para-aortic lymph node resections on 04/13/2018. -Pathology showed carcinosarcoma (malignant mixed mullerian tumor) is arising in an endometrial type polyp with no myometrial invasion identified. High-grade. 0/39 lymph nodes positive. PT1APN0, FIGO stage Ia. MMR normal. MSI-stable. -CTAP on 12/09/2018 for abdominal pain showed extensive peritoneal carcinomatosis and large soft tissue mass in the pelvis. -Biopsy of the omental mass on 12/28/2018 shows poorly differentiated carcinoma consistent with her prior malignancy. -Carboplatin and paclitaxel started on 12/29/2018. -CT scan on 05/02/2019 showed peritoneal implants in the low central small bowel mesentery and left pelvic sidewall have decreased in size measuring 1.5 x 1.9 cm and 1.7 x 2.2 cm. Soft tissue nodule in the left lateral omentum measures 1 x 1.7 cm with no evidence of metastatic disease. -Continuation of chemotherapy until complete response was recommended. -CT AP on 07/04/2019 showed continued positive response to therapy with scattered peritoneal metastasis in the pelvis and left omentum decreased in size.  No new metastatic disease.  2. Breast abnormality: -Mammogram on 05/02/2019 shows BI-RADS Category 0. She reportedly took Covid shot 1 to 2 weeks prior to the mammogram. Further scans rescheduled on 06/21/2019.  3.  Peripheral neuropathy: -She reported numbness and occasional pins-and-needles sensation in the bilateral toes, right more than left.  This has started about a week ago.   PLAN:  1. Recurrent endometrial carcinosarcoma: -I reviewed her labs.  CBC and LFTs are adequate. -She will proceed with her next cycle of chemotherapy.  She will be seen back in 3 weeks for follow-up. -I have reviewed CT of the right forearm from  07/11/2019 which was normal.  She does not have any pain in the right forearm.  2. Leg pains: -Continue Percocet as needed.  3. Hypertension: -Continue Norvasc 5 mg daily.  4.  Neuropathy: -I have sent a prescription for gabapentin 300 mg 2-3 times a day.   Orders placed this encounter:  No orders of the defined types were placed in this encounter.    [name], MD [place] [phone]   I, Milinda Antis, am acting as a scribe for Dr. Sanda Linger.  I, [name] MD, have reviewed the above documentation for accuracy and completeness, and I agree with the above.

## 2019-07-30 LAB — CA 125: Cancer Antigen (CA) 125: 5.3 U/mL (ref 0.0–38.1)

## 2019-08-01 ENCOUNTER — Other Ambulatory Visit: Payer: Self-pay

## 2019-08-01 ENCOUNTER — Inpatient Hospital Stay (HOSPITAL_COMMUNITY): Payer: Medicaid Other

## 2019-08-01 ENCOUNTER — Encounter (HOSPITAL_COMMUNITY): Payer: Self-pay

## 2019-08-01 VITALS — BP 130/74 | HR 86 | Temp 97.3°F | Resp 18

## 2019-08-01 DIAGNOSIS — C541 Malignant neoplasm of endometrium: Secondary | ICD-10-CM

## 2019-08-01 DIAGNOSIS — Z95828 Presence of other vascular implants and grafts: Secondary | ICD-10-CM

## 2019-08-01 DIAGNOSIS — Z5111 Encounter for antineoplastic chemotherapy: Secondary | ICD-10-CM | POA: Diagnosis not present

## 2019-08-01 MED ORDER — PEGFILGRASTIM-CBQV 6 MG/0.6ML ~~LOC~~ SOSY
6.0000 mg | PREFILLED_SYRINGE | Freq: Once | SUBCUTANEOUS | Status: AC
  Administered 2019-08-01: 6 mg via SUBCUTANEOUS

## 2019-08-24 ENCOUNTER — Inpatient Hospital Stay (HOSPITAL_COMMUNITY): Payer: Medicaid Other

## 2019-08-24 ENCOUNTER — Other Ambulatory Visit: Payer: Self-pay

## 2019-08-24 ENCOUNTER — Inpatient Hospital Stay (HOSPITAL_BASED_OUTPATIENT_CLINIC_OR_DEPARTMENT_OTHER): Payer: Medicaid Other | Admitting: Hematology

## 2019-08-24 ENCOUNTER — Inpatient Hospital Stay (HOSPITAL_COMMUNITY): Payer: Medicaid Other | Attending: Hematology

## 2019-08-24 VITALS — BP 118/58 | HR 68 | Temp 97.1°F | Resp 18

## 2019-08-24 VITALS — BP 140/71 | HR 89 | Temp 97.3°F | Resp 18 | Wt 233.4 lb

## 2019-08-24 DIAGNOSIS — Z79899 Other long term (current) drug therapy: Secondary | ICD-10-CM | POA: Insufficient documentation

## 2019-08-24 DIAGNOSIS — Z90722 Acquired absence of ovaries, bilateral: Secondary | ICD-10-CM | POA: Diagnosis not present

## 2019-08-24 DIAGNOSIS — C541 Malignant neoplasm of endometrium: Secondary | ICD-10-CM

## 2019-08-24 DIAGNOSIS — G629 Polyneuropathy, unspecified: Secondary | ICD-10-CM | POA: Diagnosis not present

## 2019-08-24 DIAGNOSIS — C786 Secondary malignant neoplasm of retroperitoneum and peritoneum: Secondary | ICD-10-CM | POA: Insufficient documentation

## 2019-08-24 DIAGNOSIS — Z9221 Personal history of antineoplastic chemotherapy: Secondary | ICD-10-CM | POA: Diagnosis not present

## 2019-08-24 DIAGNOSIS — Z5189 Encounter for other specified aftercare: Secondary | ICD-10-CM | POA: Diagnosis not present

## 2019-08-24 DIAGNOSIS — Z90710 Acquired absence of both cervix and uterus: Secondary | ICD-10-CM | POA: Diagnosis not present

## 2019-08-24 DIAGNOSIS — I10 Essential (primary) hypertension: Secondary | ICD-10-CM | POA: Insufficient documentation

## 2019-08-24 DIAGNOSIS — Z5111 Encounter for antineoplastic chemotherapy: Secondary | ICD-10-CM | POA: Insufficient documentation

## 2019-08-24 DIAGNOSIS — Z95828 Presence of other vascular implants and grafts: Secondary | ICD-10-CM

## 2019-08-24 LAB — CBC WITH DIFFERENTIAL/PLATELET
Abs Immature Granulocytes: 0.02 10*3/uL (ref 0.00–0.07)
Basophils Absolute: 0 10*3/uL (ref 0.0–0.1)
Basophils Relative: 1 %
Eosinophils Absolute: 0.1 10*3/uL (ref 0.0–0.5)
Eosinophils Relative: 1 %
HCT: 32.3 % — ABNORMAL LOW (ref 36.0–46.0)
Hemoglobin: 10.6 g/dL — ABNORMAL LOW (ref 12.0–15.0)
Immature Granulocytes: 0 %
Lymphocytes Relative: 44 %
Lymphs Abs: 2.2 10*3/uL (ref 0.7–4.0)
MCH: 34 pg (ref 26.0–34.0)
MCHC: 32.8 g/dL (ref 30.0–36.0)
MCV: 103.5 fL — ABNORMAL HIGH (ref 80.0–100.0)
Monocytes Absolute: 0.6 10*3/uL (ref 0.1–1.0)
Monocytes Relative: 12 %
Neutro Abs: 2.1 10*3/uL (ref 1.7–7.7)
Neutrophils Relative %: 42 %
Platelets: 242 10*3/uL (ref 150–400)
RBC: 3.12 MIL/uL — ABNORMAL LOW (ref 3.87–5.11)
RDW: 14.3 % (ref 11.5–15.5)
WBC: 5 10*3/uL (ref 4.0–10.5)
nRBC: 0 % (ref 0.0–0.2)

## 2019-08-24 LAB — COMPREHENSIVE METABOLIC PANEL
ALT: 19 U/L (ref 0–44)
AST: 18 U/L (ref 15–41)
Albumin: 4.2 g/dL (ref 3.5–5.0)
Alkaline Phosphatase: 107 U/L (ref 38–126)
Anion gap: 12 (ref 5–15)
BUN: 18 mg/dL (ref 6–20)
CO2: 23 mmol/L (ref 22–32)
Calcium: 9.2 mg/dL (ref 8.9–10.3)
Chloride: 103 mmol/L (ref 98–111)
Creatinine, Ser: 0.61 mg/dL (ref 0.44–1.00)
GFR calc Af Amer: >60 mL/min (ref >60)
GFR calc non Af Amer: >60 mL/min (ref >60)
Glucose, Bld: 111 mg/dL — ABNORMAL HIGH (ref 70–99)
Potassium: 3.8 mmol/L (ref 3.5–5.1)
Sodium: 138 mmol/L (ref 135–145)
Total Bilirubin: 0.5 mg/dL (ref 0.3–1.2)
Total Protein: 7.2 g/dL (ref 6.5–8.1)

## 2019-08-24 MED ORDER — FAMOTIDINE IN NACL 20-0.9 MG/50ML-% IV SOLN
20.0000 mg | Freq: Once | INTRAVENOUS | Status: AC
  Administered 2019-08-24: 20 mg via INTRAVENOUS
  Filled 2019-08-24: qty 50

## 2019-08-24 MED ORDER — DIPHENHYDRAMINE HCL 50 MG/ML IJ SOLN
50.0000 mg | Freq: Once | INTRAMUSCULAR | Status: AC
  Administered 2019-08-24: 50 mg via INTRAVENOUS
  Filled 2019-08-24: qty 1

## 2019-08-24 MED ORDER — SODIUM CHLORIDE 0.9 % IV SOLN
10.0000 mg | Freq: Once | INTRAVENOUS | Status: AC
  Administered 2019-08-24: 10 mg via INTRAVENOUS
  Filled 2019-08-24: qty 10

## 2019-08-24 MED ORDER — SODIUM CHLORIDE 0.9 % IV SOLN: Freq: Once | INTRAVENOUS | Status: AC

## 2019-08-24 MED ORDER — SODIUM CHLORIDE 0.9 % IV SOLN
862.8000 mg | Freq: Once | INTRAVENOUS | Status: AC
  Administered 2019-08-24: 860 mg via INTRAVENOUS
  Filled 2019-08-24: qty 86

## 2019-08-24 MED ORDER — SODIUM CHLORIDE 0.9% FLUSH
10.0000 mL | INTRAVENOUS | Status: DC | PRN
  Administered 2019-08-24: 10 mL

## 2019-08-24 MED ORDER — HEPARIN SOD (PORK) LOCK FLUSH 100 UNIT/ML IV SOLN
500.0000 [IU] | Freq: Once | INTRAVENOUS | Status: AC | PRN
  Administered 2019-08-24: 500 [IU]

## 2019-08-24 MED ORDER — OXYCODONE-ACETAMINOPHEN 5-325 MG PO TABS: 1.0000 | ORAL_TABLET | Freq: Three times a day (TID) | ORAL | 0 refills | Status: DC | PRN

## 2019-08-24 MED ORDER — SODIUM CHLORIDE 0.9 % IV SOLN
140.0000 mg/m2 | Freq: Once | INTRAVENOUS | Status: AC
  Administered 2019-08-24: 288 mg via INTRAVENOUS
  Filled 2019-08-24: qty 48

## 2019-08-24 MED ORDER — SODIUM CHLORIDE 0.9 % IV SOLN
150.0000 mg | Freq: Once | INTRAVENOUS | Status: AC
  Administered 2019-08-24: 150 mg via INTRAVENOUS
  Filled 2019-08-24: qty 150

## 2019-08-24 MED ORDER — PALONOSETRON HCL INJECTION 0.25 MG/5ML
0.2500 mg | Freq: Once | INTRAVENOUS | Status: AC
  Administered 2019-08-24: 0.25 mg via INTRAVENOUS
  Filled 2019-08-24: qty 5

## 2019-08-24 NOTE — Patient Instructions (Signed)
[facility] Discharge Instructions for Patients Receiving Chemotherapy   Beginning January 23rd 2017 lab work for the Cancer Center will be done in the  Main lab at [place] on 1st floor. If you have a lab appointment with the Cancer Center please come in thru the  Main Entrance and check in at the main information desk   Today you received the following chemotherapy agents Taxol and Carboplatin. Follow-up as scheduled  To help prevent nausea and vomiting after your treatment, we encourage you to take your nausea medication   If you develop nausea and vomiting, or diarrhea that is not controlled by your medication, call the clinic.  The clinic phone number is [phone]. Office hours are Monday-Friday 8:30am-5:00pm.  BELOW ARE SYMPTOMS THAT SHOULD BE REPORTED IMMEDIATELY:  *FEVER GREATER THAN 101.0 F  *CHILLS WITH OR WITHOUT FEVER  NAUSEA AND VOMITING THAT IS NOT CONTROLLED WITH YOUR NAUSEA MEDICATION  *UNUSUAL SHORTNESS OF BREATH  *UNUSUAL BRUISING OR BLEEDING  TENDERNESS IN MOUTH AND THROAT WITH OR WITHOUT PRESENCE OF ULCERS  *URINARY PROBLEMS  *BOWEL PROBLEMS  UNUSUAL RASH Items with * indicate a potential emergency and should be followed up as soon as possible. If you have an emergency after office hours please contact your primary care physician or go to the nearest emergency department.  Please call the clinic during office hours if you have any questions or concerns.   You may also contact the Patient Navigator at [phone] should you have any questions or need assistance in obtaining follow up care.      Resources For Cancer Patients and their Caregivers ? American Cancer Society: Can assist with transportation, wigs, general needs, runs Look Good Feel Better.        1-888-227-6333 ? Cancer Care: Provides financial assistance, online support groups, medication/co-pay assistance.  1-800-813-HOPE (4673) ? [facility] Assists [place] cancer patients and their families through emotional , educational and financial support.  336-427-4357 ? [place] DSS Where to apply for food stamps, Medicaid and utility assistance. 336-342-1394 ? [facility]: Transportation to medical appointments. 336-347-2287 ? Social Security Administration: May apply for disability if have a Stage IV cancer. 336-342-7796 1-800-772-1213 ? [place] Aging, Disability and Transit Services: Assists with nutrition, care and transit needs. 336-349-2343         

## 2019-08-24 NOTE — Patient Instructions (Signed)
[place] at [facility] Discharge Instructions  You were seen today by Dr. Delton Coombes. He went over your recent results. You received your treatment today. You will get your injection on Friday. If your legs become swollen, call the office and notify us. Dr. Delton Coombes will see you back in 3 weeks for labs and follow up.   Thank you for choosing [place] at [facility] to provide your oncology and hematology care.  To afford each patient quality time with our provider, please arrive at least 15 minutes before your scheduled appointment time.   If you have a lab appointment with the [facility] please come in thru the Main Entrance and check in at the main information desk  You need to re-schedule your appointment should you arrive 10 or more minutes late.  We strive to give you quality time with our providers, and arriving late affects you and other patients whose appointments are after yours.  Also, if you no show three or more times for appointments you may be dismissed from the clinic at the providers discretion.     Again, thank you for choosing [facility].  Our hope is that these requests will decrease the amount of time that you wait before being seen by our physicians.       _____________________________________________________________  Should you have questions after your visit to [facility], please contact our office at [phone] between the hours of 8:00 a.m. and 4:30 p.m.  Voicemails left after 4:00 p.m. will not be returned until the following business day.  For prescription refill requests, have your pharmacy contact our office and allow 72 hours.    Cancer Center Support Programs:   > Cancer Support Group  2nd Tuesday of the month 1pm-2pm, Journey Room

## 2019-08-24 NOTE — Progress Notes (Signed)
0924 Labs reviewed with and pt seen by Dr. Delton Coombes and pt approved for Taxol and Carboplatin infusions today per MD           Beverly Gust Petrich tolerated chemo tx well without complaints or incident. VSS upon discharge. Pt discharged self ambulatory in satisfactory condition

## 2019-08-24 NOTE — Progress Notes (Signed)
Jessica [name], Jessica [name]   CLINIC:  Medical Oncology/Hematology  PCP:  Patient, No Pcp Per None None   REASON FOR VISIT:  Follow-up for recurrent endometrial cancer  PRIOR THERAPY: TAH & BSO, bilateral pelvic and para-aortic lymph nodes dissections on 04/13/2018  NGS Results: Not done  CURRENT THERAPY: Carboplatin, paclitaxel & Aloxi  BRIEF ONCOLOGIC HISTORY:  Oncology History  Endometrial cancer ([place])  03/31/2018 Initial Diagnosis   Endometrial cancer ([place])   12/29/2018 -  Chemotherapy   The patient had palonosetron (ALOXI) injection 0.25 mg, 0.25 mg, Intravenous,  Once, 11 of 12 cycles Administration: 0.25 mg (12/29/2018), 0.25 mg (01/19/2019), 0.25 mg (02/09/2019), 0.25 mg (03/02/2019), 0.25 mg (03/23/2019), 0.25 mg (04/13/2019), 0.25 mg (05/04/2019), 0.25 mg (05/25/2019), 0.25 mg (06/15/2019), 0.25 mg (07/06/2019), 0.25 mg (07/29/2019) pegfilgrastim-cbqv (UDENYCA) injection 6 mg, 6 mg, Subcutaneous, Once, 11 of 12 cycles Administration: 6 mg (12/31/2018), 6 mg (01/20/2019), 6 mg (02/11/2019), 6 mg (03/04/2019), 6 mg (03/25/2019), 6 mg (04/15/2019), 6 mg (05/06/2019), 6 mg (05/27/2019), 6 mg (06/17/2019), 6 mg (07/08/2019), 6 mg (08/01/2019) CARBOplatin (PARAPLATIN) 860 mg in sodium chloride 0.9 % 500 mL chemo infusion, 860 mg (100 % of original dose 862.8 mg), Intravenous,  Once, 11 of 12 cycles Dose modification:   (original dose 862.8 mg, Cycle 1),   (original dose 862.8 mg, Cycle 2),   (original dose 862.8 mg, Cycle 3),   (original dose 862.8 mg, Cycle 4),   (original dose 862.8 mg, Cycle 5),   (original dose 862.8 mg, Cycle 6),   (original dose 862.8 mg, Cycle 8),   (original dose 862.8 mg, Cycle 8) Administration: 860 mg (12/29/2018), 860 mg (01/19/2019), 860 mg (02/09/2019), 860 mg (03/02/2019), 860 mg (03/23/2019), 860 mg (04/13/2019), 860 mg (05/04/2019), 860 mg (05/25/2019), 860 mg (06/15/2019), 860 mg (07/06/2019), 860 mg (07/29/2019) fosaprepitant (EMEND) 150 mg  in sodium chloride 0.9 % 145 mL IVPB, 150 mg, Intravenous,  Once, 11 of 12 cycles Administration: 150 mg (01/19/2019), 150 mg (02/09/2019), 150 mg (03/02/2019), 150 mg (03/23/2019), 150 mg (04/13/2019), 150 mg (05/04/2019), 150 mg (05/25/2019), 150 mg (06/15/2019), 150 mg (07/06/2019), 150 mg (07/29/2019) PACLitaxel (TAXOL) 360 mg in sodium chloride 0.9 % 500 mL chemo infusion (> 66m/m2), 175 mg/m2 = 360 mg, Intravenous,  Once, 11 of 12 cycles Dose modification: 140 mg/m2 (80 % of original dose 175 mg/m2, Cycle 7, Reason: Other (see comments), Comment: hand pains), 140 mg/m2 (80 % of original dose 175 mg/m2, Cycle 8, Reason: Other (see comments), Comment: neoropathic pain) Administration: 360 mg (12/29/2018), 360 mg (01/19/2019), 360 mg (02/09/2019), 360 mg (03/02/2019), 360 mg (03/23/2019), 360 mg (04/13/2019), 288 mg (05/04/2019), 288 mg (05/25/2019), 288 mg (06/15/2019), 288 mg (07/06/2019), 288 mg (07/29/2019)  for chemotherapy treatment.      CANCER STAGING: Cancer Staging No matching staging information was found for the patient.  INTERVAL HISTORY:  Jessica [name] a 59y.o. female, returns for routine follow-up and consideration for next cycle of chemotherapy. [name] last seen on 07/29/2019.  Due for cycle #12 of carboplatin, paclitaxel & Aloxi today.   Overall, she tells me she has been feeling pretty well. She is tearful today after reporting that she has gained 9 lbs in the the 3 days between her chemo and injection and reports feeling bloated in her abdomen, but denies swelling in her legs. Her appetite is okay. Her toes of her right foot are more numb than the toes of her left  foot and she takes gabapentin QD to BID due to drowsiness; her hands are not numb. Her lower back and RLQ pain are steady and she takes Percocet as needed. She reports getting nauseous for a couple days after treatment, but then it resolves. She drinks about 160 ounces of water daily.  Overall, she feels ready for next cycle  of chemo today.    REVIEW OF SYSTEMS:  Review of Systems  Constitutional: Positive for fatigue (moderate). Negative for appetite change.  Cardiovascular: Negative for leg swelling.  Gastrointestinal: Positive for abdominal distention, abdominal pain (RLQ pain) and nausea (lasting several days after chemo). Negative for vomiting.  Musculoskeletal: Positive for back pain (3/10 back pain).  Neurological: Positive for numbness (toes (R > L)).  Psychiatric/Behavioral: Positive for depression and sleep disturbance. The patient is nervous/anxious.   All other systems reviewed and are negative.   PAST MEDICAL/SURGICAL HISTORY:  Past Medical History:  Diagnosis Date  . Elevated blood pressure reading   . Endometrial cancer ([place])   . Hypertension   . PMB (postmenopausal bleeding)   . Port-A-Cath in place 12/24/2018   Past Surgical History:  Procedure Laterality Date  . IR IMAGING GUIDED PORT INSERTION  12/28/2018  . IR RADIOLOGIST EVAL & MGMT  06/02/2018  . IR RADIOLOGIST EVAL & MGMT  06/16/2018  . NECK SURGERY  2000   spinal surgery , titanium rod in place   . ROBOTIC ASSISTED TOTAL HYSTERECTOMY WITH BILATERAL SALPINGO OOPHERECTOMY N/A 04/13/2018   Procedure: XI ROBOTIC ASSISTED TOTAL HYSTERECTOMY WITH BILATERAL SALPINGO OOPHORECTOMY;  Surgeon: Everitt Amber, MD;  Location: [facility];  Service: Gynecology;  Laterality: N/A;  . ROBOTIC PELVIC AND PARA-AORTIC LYMPH NODE DISSECTION N/A 04/13/2018   Procedure: XI ROBOTIC PELVIC LYMPHADECTOMY AND PARA-AORTIC LYMPH NODE DISSECTION;  Surgeon: Everitt Amber, MD;  Location: [facility];  Service: Gynecology;  Laterality: N/A;    SOCIAL HISTORY:  Social History   Socioeconomic History  . Marital status: Widowed    Spouse name: Not on file  . Number of children: Not on file  . Years of education: Not on file  . Highest education level: Not on file  Occupational History  . Not on file  Tobacco Use  . Smoking status: Never Smoker  . Smokeless tobacco: Never  Used  Vaping Use  . Vaping Use: Never used  Substance and Sexual Activity  . Alcohol use: Yes    Alcohol/week: 4.0 standard drinks    Types: 4 Glasses of wine per week    Comment: weekends  . Drug use: Never  . Sexual activity: Not Currently  Other Topics Concern  . Not on file  Social History Narrative  . Not on file   Social Determinants of Health   Financial Resource Strain:   . Difficulty of Paying Living Expenses:   Food Insecurity:   . Worried About Charity fundraiser in the Last Year:   . Arboriculturist in the Last Year:   Transportation Needs:   . Film/video editor (Medical):   Marland Kitchen Lack of Transportation (Non-Medical):   Physical Activity:   . Days of Exercise per Week:   . Minutes of Exercise per Session:   Stress:   . Feeling of Stress :   Social Connections:   . Frequency of Communication with Friends and Family:   . Frequency of Social Gatherings with Friends and Family:   . Attends Religious Services:   . Active Member of Clubs or Organizations:   .  Attends Archivist Meetings:   Marland Kitchen Marital Status:   Intimate Partner Violence:   . Fear of Current or Ex-Partner:   . Emotionally Abused:   Marland Kitchen Physically Abused:   . Sexually Abused:     FAMILY HISTORY:  Family History  Problem Relation Age of Onset  . Hypertension Father   . Heart disease Father   . Cancer Father        lung  . Hypertension Mother     CURRENT MEDICATIONS:  Current Outpatient Medications  Medication Sig Dispense Refill  . amLODipine (NORVASC) 5 MG tablet Take 1 tablet (5 mg total) by mouth daily. 90 tablet 1  . atorvastatin (LIPITOR) 20 MG tablet Take 1 tablet (20 mg total) by mouth daily. 90 tablet 1  . CARBOPLATIN IV Inject into the vein every 21 ( twenty-one) days.    Marland Kitchen dicyclomine (BENTYL) 20 MG tablet Take 1 tablet (20 mg total) by mouth every 6 (six) hours. 30 tablet 1  . gabapentin (NEURONTIN) 300 MG capsule Take 1 capsule (300 mg total) by mouth 3 (three) times  daily. 90 capsule 3  . Multiple Vitamin (MULTIVITAMIN) tablet Take 1 tablet by mouth daily.    Marland Kitchen PACLITAXEL IV Inject into the vein every 21 ( twenty-one) days.    . phenazopyridine (PYRIDIUM) 100 MG tablet Take 1 tablet (100 mg total) by mouth 2 (two) times daily as needed for pain. 10 tablet 0  . polyethylene glycol (MIRALAX / GLYCOLAX) 17 g packet Take 17 g by mouth 2 (two) times daily as needed.     . CONSTULOSE 10 GM/15ML solution Take 30 g by mouth daily as needed.  (Patient not taking: Reported on 08/24/2019)    . ibuprofen (ADVIL) 200 MG tablet Take 200 mg by mouth every 6 (six) hours as needed.  (Patient not taking: Reported on 08/24/2019)    . lidocaine-prilocaine (EMLA) cream Apply small amount to port a cath site and cover with plastic wrap one hour prior to chemotherapy appointments (Patient not taking: Reported on 08/24/2019) 30 g 3  . oxyCODONE-acetaminophen (PERCOCET) 5-325 MG tablet Take 1 tablet by mouth every 8 (eight) hours as needed (pain). (Patient not taking: Reported on 08/24/2019) 30 tablet 0  . prochlorperazine (COMPAZINE) 10 MG tablet Take 1 tablet (10 mg total) by mouth every 6 (six) hours as needed (Nausea or vomiting). (Patient not taking: Reported on 08/24/2019) 30 tablet 1   No current facility-administered medications for this visit.    ALLERGIES:  No Known Allergies  PHYSICAL EXAM:  Performance status (ECOG): 1 - Symptomatic but completely ambulatory  Vitals:   08/24/19 0803  BP: 140/71  Pulse: 89  Resp: 18  Temp: (!) 97.3 F (36.3 C)  SpO2: 96%   Wt Readings from Last 3 Encounters:  08/24/19 233 lb 6.4 oz (105.9 kg)  07/29/19 224 lb 3.2 oz (101.7 kg)  07/06/19 223 lb 11.2 oz (101.5 kg)   Physical Exam Vitals reviewed.  Constitutional:      Appearance: Normal appearance. She is obese.  Cardiovascular:     Rate and Rhythm: Normal rate and regular rhythm.     Pulses: Normal pulses.     Heart sounds: Normal heart sounds.  Pulmonary:     Effort:  Pulmonary effort is normal.     Breath sounds: Normal breath sounds.  Chest:     Comments: Port-a-Cath on R chest Abdominal:     General: There is no distension.     Palpations: Abdomen  is soft. There is no mass.     Tenderness: There is no abdominal tenderness.  Musculoskeletal:     Right lower leg: No edema.     Left lower leg: No edema.  Neurological:     General: No focal deficit present.     Mental Status: She is alert and oriented to person, place, and time.  Psychiatric:        Mood and Affect: Mood normal.        Behavior: Behavior normal.     LABORATORY DATA:  I have reviewed the labs as listed.  CBC Latest Ref Rng & Units 07/29/2019 07/06/2019 06/15/2019  WBC 4.0 - 10.5 K/uL 4.5 4.7 4.0  Hemoglobin 12.0 - 15.0 g/dL 9.9(L) 10.7(L) 10.0(L)  Hematocrit 36 - 46 % 29.9(L) 32.4(L) 30.0(L)  Platelets 150 - 400 K/uL 207 183 174   CMP Latest Ref Rng & Units 07/29/2019 07/06/2019 06/15/2019  Glucose 70 - 99 mg/dL 130(H) 146(H) 104(H)  BUN 6 - 20 mg/dL 17 16 14   Creatinine 0.44 - 1.00 mg/dL 0.53 0.65 0.56  Sodium 135 - 145 mmol/L 138 140 137  Potassium 3.5 - 5.1 mmol/L 3.8 3.8 3.7  Chloride 98 - 111 mmol/L 105 104 103  CO2 22 - 32 mmol/L 24 28 24   Calcium 8.9 - 10.3 mg/dL 9.0 9.4 9.1  Total Protein 6.5 - 8.1 g/dL 7.0 7.1 7.0  Total Bilirubin 0.3 - 1.2 mg/dL 0.5 0.4 0.6  Alkaline Phos 38 - 126 U/L 104 109 124  AST 15 - 41 U/L 19 15 19   ALT 0 - 44 U/L 20 21 25     DIAGNOSTIC IMAGING:  I have independently reviewed the scans and discussed with the patient. No results found.   ASSESSMENT:  1. Recurrent endometrial carcinosarcoma: -TAH, BSO, bilateral pelvic and para-aortic lymph node resections on 04/13/2018. -Pathology showed carcinosarcoma (malignant mixed mullerian tumor) is arising in an endometrial type polyp with no myometrial invasion identified. High-grade. 0/39 lymph nodes positive. PT1APN0, FIGO stage Ia. MMR normal. MSI-stable. -CTAP on 12/09/2018 for abdominal  pain showed extensive peritoneal carcinomatosis and large soft tissue mass in the pelvis. -Biopsy of the omental mass on 12/28/2018 shows poorly differentiated carcinoma consistent with her prior malignancy. -Carboplatin and paclitaxel started on 12/29/2018. -CT scan on 05/02/2019 showed peritoneal implants in the low central small bowel mesentery and left pelvic sidewall have decreased in size measuring 1.5 x 1.9 cm and 1.7 x 2.2 cm. Soft tissue nodule in the left lateral omentum measures 1 x 1.7 cm with no evidence of metastatic disease. -Continuation of chemotherapy until complete response was recommended. -CT AP on 07/04/2019 showed continued positive response to therapy with scattered peritoneal metastasis in the pelvis and left omentum decreased in size. No new metastatic disease.  2. Breast abnormality: -Mammogram on 05/02/2019 shows BI-RADS Category 0. She reportedly took Covid shot 1 to 2 weeks prior to the mammogram. Further scans rescheduled on 06/21/2019.  3.  Peripheral neuropathy: -She reported numbness and occasional pins-and-needles sensation in the bilateral toes, right more than left.  This has started about a week ago.   PLAN:  1. Recurrent endometrial carcinosarcoma: -She has tolerated last cycle of chemotherapy very well.  However she reported 9 pound weight gain after last treatment. -She also had low back pain and right-sided abdominal pain more often.  She is worried that the weight gain could be coming from recurrence of cancer. -I have reassured her that her last tumor marker was within normal limits  as well as her recent scan showed very good response. -We reviewed her labs today which showed normal chemistries and CBC. -She will proceed with her next cycle of treatment today.  I plan to check her back in 3 weeks with a tumor marker.  2.  Low back pain/right-sided abdominal pain: -Continue Percocet as needed.  I have sent a refill.  3. Hypertension: -Continue  Norvasc 5 mg daily.  4.  Neuropathy: -Continue gabapentin 300 mg at bedtime.  She is occasionally taking gabapentin during morning time.   Orders placed this encounter:  No orders of the defined types were placed in this encounter.    [name], MD [place] [phone]   I, Milinda Antis, am acting as a scribe for Dr. Sanda Linger.  I, [name] MD, have reviewed the above documentation for accuracy and completeness, and I agree with the above.

## 2019-08-24 NOTE — Patient Instructions (Signed)
[facility] at [facility] Discharge Instructions  Labs drawn from portacath today   Thank you for choosing [facility] at [facility] to provide your oncology and hematology care.  To afford each patient quality time with our provider, please arrive at least 15 minutes before your scheduled appointment time.   If you have a lab appointment with the Cancer Center please come in thru the Main Entrance and check in at the main information desk.  You need to re-schedule your appointment should you arrive 10 or more minutes late.  We strive to give you quality time with our providers, and arriving late affects you and other patients whose appointments are after yours.  Also, if you no show three or more times for appointments you may be dismissed from the clinic at the providers discretion.     Again, thank you for choosing [facility].  Our hope is that these requests will decrease the amount of time that you wait before being seen by our physicians.       _____________________________________________________________  Should you have questions after your visit to [facility], please contact our office at [phone] and follow the prompts.  Our office hours are 8:00 a.m. and 4:30 p.m. Monday - Friday.  Please note that voicemails left after 4:00 p.m. may not be returned until the following business day.  We are closed weekends and major holidays.  You do have access to a nurse 24-7, just call the main number to the clinic [phone] and do not press any options, hold on the line and a nurse will answer the phone.    For prescription refill requests, have your pharmacy contact our office and allow 72 hours.    Due to Covid, you will need to wear a mask upon entering the hospital. If you do not have a mask, a mask will be given to you at the Main Entrance upon arrival. For doctor visits, patients may have 1 support person age 18  or older with them. For treatment visits, patients can not have anyone with them due to social distancing guidelines and our immunocompromised population.     

## 2019-08-26 ENCOUNTER — Encounter (HOSPITAL_COMMUNITY): Payer: Self-pay

## 2019-08-26 ENCOUNTER — Inpatient Hospital Stay (HOSPITAL_COMMUNITY): Payer: Medicaid Other

## 2019-08-26 ENCOUNTER — Other Ambulatory Visit: Payer: Self-pay

## 2019-08-26 VITALS — BP 122/66 | HR 62 | Temp 97.2°F | Resp 18

## 2019-08-26 DIAGNOSIS — C541 Malignant neoplasm of endometrium: Secondary | ICD-10-CM

## 2019-08-26 DIAGNOSIS — Z95828 Presence of other vascular implants and grafts: Secondary | ICD-10-CM

## 2019-08-26 DIAGNOSIS — Z5111 Encounter for antineoplastic chemotherapy: Secondary | ICD-10-CM | POA: Diagnosis not present

## 2019-08-26 MED ORDER — PEGFILGRASTIM-CBQV 6 MG/0.6ML ~~LOC~~ SOSY
6.0000 mg | PREFILLED_SYRINGE | Freq: Once | SUBCUTANEOUS | Status: AC
  Administered 2019-08-26: 6 mg via SUBCUTANEOUS

## 2019-09-07 ENCOUNTER — Ambulatory Visit: Payer: Medicaid Other | Admitting: Family Medicine

## 2019-09-07 ENCOUNTER — Encounter: Payer: Self-pay | Admitting: Family Medicine

## 2019-09-07 ENCOUNTER — Other Ambulatory Visit: Payer: Self-pay

## 2019-09-07 VITALS — BP 118/76 | HR 82 | Resp 16 | Ht 64.0 in | Wt 233.0 lb

## 2019-09-07 DIAGNOSIS — I10 Essential (primary) hypertension: Secondary | ICD-10-CM

## 2019-09-07 DIAGNOSIS — E785 Hyperlipidemia, unspecified: Secondary | ICD-10-CM

## 2019-09-07 DIAGNOSIS — C541 Malignant neoplasm of endometrium: Secondary | ICD-10-CM

## 2019-09-07 DIAGNOSIS — F321 Major depressive disorder, single episode, moderate: Secondary | ICD-10-CM

## 2019-09-07 DIAGNOSIS — F419 Anxiety disorder, unspecified: Secondary | ICD-10-CM | POA: Insufficient documentation

## 2019-09-07 MED ORDER — ATORVASTATIN CALCIUM 20 MG PO TABS: 20.0000 mg | ORAL_TABLET | Freq: Every day | ORAL | 1 refills | Status: DC

## 2019-09-07 MED ORDER — AMLODIPINE BESYLATE 5 MG PO TABS: 5.0000 mg | ORAL_TABLET | Freq: Every day | ORAL | 1 refills | Status: DC

## 2019-09-07 NOTE — Patient Instructions (Addendum)
I appreciate the opportunity to provide you with care for your health and wellness. Today we discussed: establish   Follow up: 3 months   No labs   Referrals today- therapy   Nice to meet you today! I will be praying for you.   Please continue to practice social distancing to keep you, your family, and our community safe.  If you must go out, please wear a mask and practice good handwashing.  It was a pleasure to see you and I look forward to continuing to work together on your health and well-being. Please do not hesitate to call the office if you need care or have questions about your care.  Have a wonderful day and week. With Gratitude, Cherly Beach, DNP, AGNP-BC

## 2019-09-07 NOTE — Progress Notes (Signed)
Subjective:  Patient ID: Jessica [name], female    DOB: 1960-07-27  Age: 59 y.o. MRN: [id number]  CC:  Chief Complaint  Patient presents with  . New Patient (Initial Visit)    establish care      HPI  HPI  Jessica [name] is a 59 year old female patient who presents today to establish care.  Previously did not have a PCP.  Moved here 3 years ago from [place].  Is currently a patient at the cancer center.  For recurrent endometrial carcinoma.  She has a history that is significant for elevated blood pressure, hyperlipidemia, postmenopausal bleeding, endometrial cancer with metastases.  She was originally diagnosed in March 2020.  They did a total hysterectomy with removal of tubes and ovaries.  However at her 33-month checkup she had recurrent masses in her pelvis area totaling of 8.  Was started on chemo at that time.  Was told that this was an aggressive cancer and that she most likely would not be able to fully recover from it rather she would have to be on chemotherapy intermittently to help with treatment of it.  She reports she is tolerating chemo okay energy levels are low.  She does have some neuropathy predominantly in her right foot more than left.  She takes gabapentin for this.  She has had her vaccinations.  Has not had a colonoscopy at this time and is not warranted her treatment is the focus at this point.  Is up-to-date on mammograms.  She reports that she does not sleep very well but however she did not sleep very well prior to cancer treatments but now is gotten worse.  She takes a Benadryl every couple of nights just to get a full night sleep.  She does agree to take something daily.  She has not been to the dentist since being here.  But has no trouble chewing or swallowing.  She denies having any trouble going to the bathroom however she does not feel like she empties her bladder fully and so she twists and turns to help make sure is empty.  She does have  some constipation and intermittent diarrhea at times.  But she thinks is related to chemo.  She is not having any excessive memory loss however she reports her memory has worsened since chemo.  She denies any falls or injury in the last 2 weeks to 3 months.  Did break her toe and foot last year.  Denies having any skin issues.  Denies having any hearing issues.  Has not had her eyes checked has been wearing readers but thinks that she needs to get her eyes checked.  Along with moving she has her daughter and granddaughter that moved with her.  She has 2 other children a daughter and a son that are in [place] as well.  She has a sister that lives in the [place] area.  She has been suffering with depression and anxiety that is helping the situation.  However she declines wanting to be on any medications at this time but is open to possible therapy.  Today patient denies signs and symptoms of COVID 19 infection including fever, chills, cough, shortness of breath, and headache. Past Medical, Surgical, Social History, Allergies, and Medications have been Reviewed.   Past Medical History:  Diagnosis Date  . Cancer ([place])    Phreesia 09/05/2019  . Elevated blood pressure reading   . Endometrial cancer ([place])   . Hyperlipidemia   .  Hypertension   . PMB (postmenopausal bleeding)   . Port-A-Cath in place 12/24/2018    Current Meds  Medication Sig  . amLODipine (NORVASC) 5 MG tablet Take 1 tablet (5 mg total) by mouth daily.  Marland Kitchen atorvastatin (LIPITOR) 20 MG tablet Take 1 tablet (20 mg total) by mouth daily.  Marland Kitchen CARBOPLATIN IV Inject into the vein every 21 ( twenty-one) days.  Marland Kitchen dicyclomine (BENTYL) 20 MG tablet Take 1 tablet (20 mg total) by mouth every 6 (six) hours.  . docusate sodium (COLACE) 100 MG capsule Take 200 mg by mouth 2 (two) times daily as needed for mild constipation (when taking pain medication).  . gabapentin (NEURONTIN) 300 MG capsule Take 1 capsule (300 mg total) by mouth 3 (three)  times daily.  Marland Kitchen ibuprofen (ADVIL) 200 MG tablet Take 200 mg by mouth every 6 (six) hours as needed.   . lidocaine-prilocaine (EMLA) cream Apply small amount to port a cath site and cover with plastic wrap one hour prior to chemotherapy appointments  . oxyCODONE-acetaminophen (PERCOCET) 5-325 MG tablet Take 1 tablet by mouth every 8 (eight) hours as needed (pain).  Marland Kitchen PACLITAXEL IV Inject into the vein every 21 ( twenty-one) days.  . prochlorperazine (COMPAZINE) 10 MG tablet Take 1 tablet (10 mg total) by mouth every 6 (six) hours as needed (Nausea or vomiting).  . [DISCONTINUED] amLODipine (NORVASC) 5 MG tablet Take 1 tablet (5 mg total) by mouth daily.  . [DISCONTINUED] atorvastatin (LIPITOR) 20 MG tablet Take 1 tablet (20 mg total) by mouth daily.    ROS:  Review of Systems  Constitutional: Negative.   HENT: Negative.   Eyes: Negative.   Respiratory: Negative.   Cardiovascular: Negative.   Gastrointestinal: Negative.   Genitourinary: Negative.   Musculoskeletal: Negative.   Skin: Negative.   Neurological: Positive for sensory change.  Endo/Heme/Allergies: Negative.   Psychiatric/Behavioral: Positive for depression. The patient is nervous/anxious.   All other systems reviewed and are negative.    Objective:   Today's Vitals: BP 118/76   Pulse 82   Resp 16   Ht 5\' 4"  (1.626 m)   Wt 233 lb (105.7 kg)   SpO2 96%   BMI 39.99 kg/m  Vitals with BMI 09/07/2019 08/26/2019 08/24/2019  Height 5\' 4"  - -  Weight 233 lbs - -  BMI 94.76 - -  Systolic 546 503 546  Diastolic 76 66 58  Pulse 82 62 68     Physical Exam Vitals and nursing note reviewed.  Constitutional:      Appearance: Normal appearance. She is well-developed and well-groomed. She is obese.  HENT:     Head: Normocephalic and atraumatic.     Right Ear: External ear normal.     Left Ear: External ear normal.     Mouth/Throat:     Comments: Mask in place  Eyes:     General:        Right eye: No discharge.         Left eye: No discharge.     Conjunctiva/sclera: Conjunctivae normal.  Cardiovascular:     Rate and Rhythm: Normal rate and regular rhythm.     Pulses: Normal pulses.     Heart sounds: Normal heart sounds.  Pulmonary:     Effort: Pulmonary effort is normal.     Breath sounds: Normal breath sounds.  Musculoskeletal:        General: Normal range of motion.     Cervical back: Normal range of motion and neck  supple.  Skin:    General: Skin is warm.  Neurological:     General: No focal deficit present.     Mental Status: She is alert and oriented to person, place, and time.  Psychiatric:        Attention and Perception: Attention normal.        Mood and Affect: Mood normal. Affect is tearful.        Speech: Speech normal.        Behavior: Behavior normal. Behavior is cooperative.        Thought Content: Thought content normal.        Cognition and Memory: Cognition normal.        Judgment: Judgment normal.     Comments: Pleasant, good eye contact, good communication    Depression screen [facility] 2/9 09/07/2019 09/07/2019 04/26/2018 03/29/2018  Decreased Interest 0 0 1 1  Down, Depressed, Hopeless 1 1 1 2   PHQ - 2 Score 1 1 2 3   Altered sleeping 3 - 3 3  Tired, decreased energy 3 - 2 1  Change in appetite 3 - 1 3  Feeling bad or failure about yourself  3 - 1 3  Trouble concentrating 3 - 2 3  Moving slowly or fidgety/restless 0 - 0 2  Suicidal thoughts 0 - 0 0  PHQ-9 Score 16 - 11 18   GAD 7 : Generalized Anxiety Score 09/07/2019 04/26/2018 03/29/2018  Nervous, Anxious, on Edge 1 2 2   Control/stop worrying 3 2 1   Worry too much - different things 3 2 2   Trouble relaxing 3 3 1   Restless 0 1 0  Easily annoyed or irritable 0 2 1  Afraid - awful might happen 3 1 1   Total GAD 7 Score 13 13 8   Anxiety Difficulty Not difficult at all - -    Assessment   1. Depression, major, single episode, moderate ([place])   2. Endometrial cancer ([place])   3. Essential hypertension   4. Hyperlipidemia,  unspecified hyperlipidemia type   5. Anxiety     Tests ordered Orders Placed This Encounter  Procedures  . Ambulatory referral to Psychology    Plan: Please see assessment and plan per problem list above.   Meds ordered this encounter  Medications  . atorvastatin (LIPITOR) 20 MG tablet    Sig: Take 1 tablet (20 mg total) by mouth daily.    Dispense:  90 tablet    Refill:  1    Please mail to the patient, address verified    Order Specific Question:   Supervising Provider    Answer:   Tula Nakayama E [[id number]]  . amLODipine (NORVASC) 5 MG tablet    Sig: Take 1 tablet (5 mg total) by mouth daily.    Dispense:  90 tablet    Refill:  1    Order Specific Question:   Supervising Provider    Answer:   Fayrene Helper [[id number]]    Patient to follow-up in 3 months  Perlie Mayo, NP

## 2019-09-07 NOTE — Assessment & Plan Note (Signed)
She denies having any SI or HI.  GAD scores a 13.  Declines being put on any medications at this time.  Referral to therapy was made

## 2019-09-07 NOTE — Assessment & Plan Note (Signed)
Was found to have elevated cholesterol started on Lipitor tolerating well will continue at this time.  Heart healthy diet is encouraged.

## 2019-09-07 NOTE — Assessment & Plan Note (Signed)
Denies having any SI or HI at this time.  However PHQ is 16.  Predominantly focused on the fact that she does not think that she will work on this cancer after which she was told with the treatment procedure.  Reports that she wants her granddaughter to be able to remember her.  Very tearful open to therapy psychology referral was made.  Declines wanting any medications at this time.  As she already took so many predominantly with occasional as well.

## 2019-09-07 NOTE — Assessment & Plan Note (Signed)
Has recurrent endometrial carcinoma.  HER-2 negative.  Is being treated at [facility] please see Dr. Lyndon Code notes for extensive detail.

## 2019-09-07 NOTE — Assessment & Plan Note (Signed)
Elevated blood pressure was found upon diagnosis of her cancer.  She was started on Norvasc 5 mg has done very well with this.  We will continue at this time.  Encouraged DASH diet and exercise as she tolerates.

## 2019-09-14 ENCOUNTER — Inpatient Hospital Stay (HOSPITAL_COMMUNITY): Payer: Medicaid Other

## 2019-09-14 ENCOUNTER — Encounter (HOSPITAL_COMMUNITY): Payer: Self-pay

## 2019-09-14 ENCOUNTER — Encounter (HOSPITAL_COMMUNITY): Payer: Self-pay | Admitting: Hematology

## 2019-09-14 ENCOUNTER — Other Ambulatory Visit: Payer: Self-pay

## 2019-09-14 ENCOUNTER — Inpatient Hospital Stay (HOSPITAL_BASED_OUTPATIENT_CLINIC_OR_DEPARTMENT_OTHER): Payer: Medicaid Other | Admitting: Hematology

## 2019-09-14 VITALS — BP 133/85 | HR 71 | Temp 97.2°F | Resp 18

## 2019-09-14 VITALS — BP 123/82 | HR 89 | Temp 97.5°F | Resp 18 | Wt 232.3 lb

## 2019-09-14 DIAGNOSIS — C541 Malignant neoplasm of endometrium: Secondary | ICD-10-CM

## 2019-09-14 DIAGNOSIS — Z95828 Presence of other vascular implants and grafts: Secondary | ICD-10-CM

## 2019-09-14 DIAGNOSIS — Z5111 Encounter for antineoplastic chemotherapy: Secondary | ICD-10-CM | POA: Diagnosis not present

## 2019-09-14 LAB — CBC WITH DIFFERENTIAL/PLATELET
Abs Immature Granulocytes: 0.02 10*3/uL (ref 0.00–0.07)
Basophils Absolute: 0 10*3/uL (ref 0.0–0.1)
Basophils Relative: 1 %
Eosinophils Absolute: 0.1 10*3/uL (ref 0.0–0.5)
Eosinophils Relative: 1 %
HCT: 32.1 % — ABNORMAL LOW (ref 36.0–46.0)
Hemoglobin: 10.6 g/dL — ABNORMAL LOW (ref 12.0–15.0)
Immature Granulocytes: 0 %
Lymphocytes Relative: 42 %
Lymphs Abs: 1.9 10*3/uL (ref 0.7–4.0)
MCH: 34.1 pg — ABNORMAL HIGH (ref 26.0–34.0)
MCHC: 33 g/dL (ref 30.0–36.0)
MCV: 103.2 fL — ABNORMAL HIGH (ref 80.0–100.0)
Monocytes Absolute: 0.4 10*3/uL (ref 0.1–1.0)
Monocytes Relative: 9 %
Neutro Abs: 2.1 10*3/uL (ref 1.7–7.7)
Neutrophils Relative %: 47 %
Platelets: 182 10*3/uL (ref 150–400)
RBC: 3.11 MIL/uL — ABNORMAL LOW (ref 3.87–5.11)
RDW: 14 % (ref 11.5–15.5)
WBC: 4.5 10*3/uL (ref 4.0–10.5)
nRBC: 0 % (ref 0.0–0.2)

## 2019-09-14 LAB — COMPREHENSIVE METABOLIC PANEL
ALT: 18 U/L (ref 0–44)
AST: 17 U/L (ref 15–41)
Albumin: 4 g/dL (ref 3.5–5.0)
Alkaline Phosphatase: 106 U/L (ref 38–126)
Anion gap: 8 (ref 5–15)
BUN: 12 mg/dL (ref 6–20)
CO2: 23 mmol/L (ref 22–32)
Calcium: 8.7 mg/dL — ABNORMAL LOW (ref 8.9–10.3)
Chloride: 106 mmol/L (ref 98–111)
Creatinine, Ser: 0.55 mg/dL (ref 0.44–1.00)
GFR calc Af Amer: >60 mL/min (ref >60)
GFR calc non Af Amer: >60 mL/min (ref >60)
Glucose, Bld: 164 mg/dL — ABNORMAL HIGH (ref 70–99)
Potassium: 3.4 mmol/L — ABNORMAL LOW (ref 3.5–5.1)
Sodium: 137 mmol/L (ref 135–145)
Total Bilirubin: 0.3 mg/dL (ref 0.3–1.2)
Total Protein: 6.9 g/dL (ref 6.5–8.1)

## 2019-09-14 MED ORDER — PALONOSETRON HCL INJECTION 0.25 MG/5ML
INTRAVENOUS | Status: AC
  Filled 2019-09-14: qty 5

## 2019-09-14 MED ORDER — FAMOTIDINE IN NACL 20-0.9 MG/50ML-% IV SOLN
INTRAVENOUS | Status: AC
  Filled 2019-09-14: qty 50

## 2019-09-14 MED ORDER — DIPHENHYDRAMINE HCL 50 MG/ML IJ SOLN
50.0000 mg | Freq: Once | INTRAMUSCULAR | Status: AC
  Administered 2019-09-14: 50 mg via INTRAVENOUS

## 2019-09-14 MED ORDER — SODIUM CHLORIDE 0.9% FLUSH
10.0000 mL | INTRAVENOUS | Status: DC | PRN
  Administered 2019-09-14: 10 mL

## 2019-09-14 MED ORDER — FAMOTIDINE IN NACL 20-0.9 MG/50ML-% IV SOLN
20.0000 mg | Freq: Once | INTRAVENOUS | Status: AC
  Administered 2019-09-14: 20 mg via INTRAVENOUS

## 2019-09-14 MED ORDER — HEPARIN SOD (PORK) LOCK FLUSH 100 UNIT/ML IV SOLN
500.0000 [IU] | Freq: Once | INTRAVENOUS | Status: AC | PRN
  Administered 2019-09-14: 500 [IU]

## 2019-09-14 MED ORDER — SODIUM CHLORIDE 0.9 % IV SOLN
10.0000 mg | Freq: Once | INTRAVENOUS | Status: AC
  Administered 2019-09-14: 10 mg via INTRAVENOUS
  Filled 2019-09-14: qty 10

## 2019-09-14 MED ORDER — SODIUM CHLORIDE 0.9 % IV SOLN
862.8000 mg | Freq: Once | INTRAVENOUS | Status: AC
  Administered 2019-09-14: 860 mg via INTRAVENOUS
  Filled 2019-09-14: qty 86

## 2019-09-14 MED ORDER — PALONOSETRON HCL INJECTION 0.25 MG/5ML
0.2500 mg | Freq: Once | INTRAVENOUS | Status: AC
  Administered 2019-09-14: 0.25 mg via INTRAVENOUS

## 2019-09-14 MED ORDER — SODIUM CHLORIDE 0.9 % IV SOLN
140.0000 mg/m2 | Freq: Once | INTRAVENOUS | Status: AC
  Administered 2019-09-14: 288 mg via INTRAVENOUS
  Filled 2019-09-14: qty 48

## 2019-09-14 MED ORDER — DIPHENHYDRAMINE HCL 50 MG/ML IJ SOLN
INTRAMUSCULAR | Status: AC
  Filled 2019-09-14: qty 1

## 2019-09-14 MED ORDER — SODIUM CHLORIDE 0.9 % IV SOLN: Freq: Once | INTRAVENOUS | Status: AC

## 2019-09-14 MED ORDER — SODIUM CHLORIDE 0.9 % IV SOLN
150.0000 mg | Freq: Once | INTRAVENOUS | Status: AC
  Administered 2019-09-14: 150 mg via INTRAVENOUS
  Filled 2019-09-14: qty 150

## 2019-09-14 NOTE — Progress Notes (Signed)
Patient was assessed by Dr. Katragadda and labs have been reviewed.  Patient is okay to proceed with treatment today.  ° °

## 2019-09-14 NOTE — Patient Instructions (Signed)
[place] at [facility] Discharge Instructions  You were seen today by Dr. Delton Coombes. He went over your recent results. You received your treatment today. You will be scheduled for a CT scan of your abdomen before you next visit. Dr. Delton Coombes will see you back in 3 weeks for labs and follow up.   Thank you for choosing [place] at [facility] to provide your oncology and hematology care.  To afford each patient quality time with our provider, please arrive at least 15 minutes before your scheduled appointment time.   If you have a lab appointment with the [facility] please come in thru the Main Entrance and check in at the main information desk  You need to re-schedule your appointment should you arrive 10 or more minutes late.  We strive to give you quality time with our providers, and arriving late affects you and other patients whose appointments are after yours.  Also, if you no show three or more times for appointments you may be dismissed from the clinic at the providers discretion.     Again, thank you for choosing [facility].  Our hope is that these requests will decrease the amount of time that you wait before being seen by our physicians.       _____________________________________________________________  Should you have questions after your visit to [facility], please contact our office at [phone] between the hours of 8:00 a.m. and 4:30 p.m.  Voicemails left after 4:00 p.m. will not be returned until the following business day.  For prescription refill requests, have your pharmacy contact our office and allow 72 hours.    Cancer Center Support Programs:   > Cancer Support Group  2nd Tuesday of the month 1pm-2pm, Journey Room

## 2019-09-14 NOTE — Progress Notes (Signed)
Message received from [facility] RN/ Dr. Delton Coombes to proceed with treatment. Labs within parameters for treatment and reviewed by MD. Vital signs within parameters for treatment.   Treatment given today per MD orders. Tolerated infusion without adverse affects. Vital signs stable. No complaints at this time. Discharged from clinic ambulatory. F/U with [facility] as scheduled.

## 2019-09-14 NOTE — Progress Notes (Signed)
Jessica [name], [place] [address]   CLINIC:  Medical Oncology/Hematology  PCP:  Jessica Mayo, NP [address] / [place] [address] [phone]   REASON FOR VISIT:  Follow-up for recurrent endometrial cancer  PRIOR THERAPY: TAH & BSO, bilateral pelvic and para-aortic lymph nodes dissections on 04/13/2018  NGS Results: Not done  CURRENT THERAPY: Carboplatin, paclitaxel & Aloxi every 3 weeks  BRIEF ONCOLOGIC HISTORY:  Oncology History  Endometrial cancer ([place])  03/31/2018 Initial Diagnosis   Endometrial cancer ([place])   12/29/2018 -  Chemotherapy   The patient had palonosetron (ALOXI) injection 0.25 mg, 0.25 mg, Intravenous,  Once, 12 of 14 cycles Administration: 0.25 mg (12/29/2018), 0.25 mg (01/19/2019), 0.25 mg (02/09/2019), 0.25 mg (03/02/2019), 0.25 mg (03/23/2019), 0.25 mg (04/13/2019), 0.25 mg (05/04/2019), 0.25 mg (05/25/2019), 0.25 mg (06/15/2019), 0.25 mg (07/06/2019), 0.25 mg (07/29/2019), 0.25 mg (08/24/2019) pegfilgrastim-cbqv (UDENYCA) injection 6 mg, 6 mg, Subcutaneous, Once, 12 of 14 cycles Administration: 6 mg (12/31/2018), 6 mg (01/20/2019), 6 mg (02/11/2019), 6 mg (03/04/2019), 6 mg (03/25/2019), 6 mg (04/15/2019), 6 mg (05/06/2019), 6 mg (05/27/2019), 6 mg (06/17/2019), 6 mg (07/08/2019), 6 mg (08/01/2019), 6 mg (08/26/2019) CARBOplatin (PARAPLATIN) 860 mg in sodium chloride 0.9 % 500 mL chemo infusion, 860 mg (100 % of original dose 862.8 mg), Intravenous,  Once, 12 of 14 cycles Dose modification:   (original dose 862.8 mg, Cycle 1),   (original dose 862.8 mg, Cycle 2),   (original dose 862.8 mg, Cycle 3),   (original dose 862.8 mg, Cycle 4),   (original dose 862.8 mg, Cycle 5),   (original dose 862.8 mg, Cycle 6),   (original dose 862.8 mg, Cycle 8),   (original dose 862.8 mg, Cycle 8) Administration: 860 mg (12/29/2018), 860 mg (01/19/2019), 860 mg (02/09/2019), 860 mg (03/02/2019), 860 mg (03/23/2019), 860 mg (04/13/2019), 860 mg (05/04/2019), 860 mg  (05/25/2019), 860 mg (06/15/2019), 860 mg (07/06/2019), 860 mg (07/29/2019), 860 mg (08/24/2019) fosaprepitant (EMEND) 150 mg in sodium chloride 0.9 % 145 mL IVPB, 150 mg, Intravenous,  Once, 12 of 14 cycles Administration: 150 mg (01/19/2019), 150 mg (02/09/2019), 150 mg (03/02/2019), 150 mg (03/23/2019), 150 mg (04/13/2019), 150 mg (05/04/2019), 150 mg (05/25/2019), 150 mg (06/15/2019), 150 mg (07/06/2019), 150 mg (07/29/2019), 150 mg (08/24/2019) PACLitaxel (TAXOL) 360 mg in sodium chloride 0.9 % 500 mL chemo infusion (> 38m/m2), 175 mg/m2 = 360 mg, Intravenous,  Once, 12 of 14 cycles Dose modification: 140 mg/m2 (80 % of original dose 175 mg/m2, Cycle 7, Reason: Other (see comments), Comment: hand pains), 140 mg/m2 (80 % of original dose 175 mg/m2, Cycle 8, Reason: Other (see comments), Comment: neoropathic pain) Administration: 360 mg (12/29/2018), 360 mg (01/19/2019), 360 mg (02/09/2019), 360 mg (03/02/2019), 360 mg (03/23/2019), 360 mg (04/13/2019), 288 mg (05/04/2019), 288 mg (05/25/2019), 288 mg (06/15/2019), 288 mg (07/06/2019), 288 mg (07/29/2019), 288 mg (08/24/2019)  for chemotherapy treatment.      CANCER STAGING: Cancer Staging No matching staging information was found for the patient.  INTERVAL HISTORY:  [name] a 59y.o. female, returns for routine follow-up and consideration for next cycle of chemotherapy. Jessica [name] last seen on 08/24/2019.  Due for cycle #13 of carboplatin, paclitaxel and Aloxi today.   Overall, she tells me she has been feeling okay. She reports that she had nausea for 4 days after the previous treatment, but no vomiting. She also complains of numbness in her toes, and now her right toes feel  swollen and constantly tingling. Her abdominal pain has improved, though her back pain persists. She does not take Percocet daily and she takes the gabapentin BID. She denies having any new cough. Her appetite is good.  Overall, she feels ready for next cycle of chemo today.    REVIEW OF  SYSTEMS:  Review of Systems  Constitutional: Positive for diaphoresis (night sweats) and fatigue (severe). Negative for appetite change.  Respiratory: Negative for cough.   Cardiovascular: Positive for leg swelling (leg cramping).  Musculoskeletal: Positive for arthralgias (R hip and knee pain).  Neurological: Positive for headaches and numbness (+ tingling in bilat toes (R > L)).  All other systems reviewed and are negative.   PAST MEDICAL/SURGICAL HISTORY:  Past Medical History:  Diagnosis Date  . Cancer ([place])    Phreesia 09/05/2019  . Elevated blood pressure reading   . Endometrial cancer ([place])   . Hyperlipidemia   . Hypertension   . PMB (postmenopausal bleeding)   . Port-A-Cath in place 12/24/2018   Past Surgical History:  Procedure Laterality Date  . ABDOMINAL HYSTERECTOMY N/A    Phreesia 09/05/2019  . IR IMAGING GUIDED PORT INSERTION  12/28/2018  . IR RADIOLOGIST EVAL & MGMT  06/02/2018  . IR RADIOLOGIST EVAL & MGMT  06/16/2018  . NECK SURGERY  2000   spinal surgery , titanium rod in place   . ROBOTIC ASSISTED TOTAL HYSTERECTOMY WITH BILATERAL SALPINGO OOPHERECTOMY N/A 04/13/2018   Procedure: XI ROBOTIC ASSISTED TOTAL HYSTERECTOMY WITH BILATERAL SALPINGO OOPHORECTOMY;  Surgeon: Jessica Amber, MD;  Location: [facility];  Service: Gynecology;  Laterality: N/A;  . ROBOTIC PELVIC AND PARA-AORTIC LYMPH NODE DISSECTION N/A 04/13/2018   Procedure: XI ROBOTIC PELVIC LYMPHADECTOMY AND PARA-AORTIC LYMPH NODE DISSECTION;  Surgeon: Jessica Amber, MD;  Location: [facility];  Service: Gynecology;  Laterality: N/A;    SOCIAL HISTORY:  Social History   Socioeconomic History  . Marital status: Widowed    Spouse name: Not on file  . Number of children: 3  . Years of education: Not on file  . Highest education level: Not on file  Occupational History  . 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 Smoking status: Never Smoker  . Smokeless tobacco: Never Used  Vaping Use  . Vaping  Use: Never used  Substance and Sexual Activity  . Alcohol use: Yes    Alcohol/week: 4.0 standard drinks    Types: 4 Glasses of wine per week    Comment: weekends  . Drug use: Never  . Sexual activity: Not Currently  Other Topics Concern  . Not on file  Social History Narrative   Lives with daughter and granddaughter here in [place], moved 3 years ago      Cats: Zoe-59 years old, Eduard Clos, and Horald Pollen      Enjoys: walking when she can tolerate- spending time with granddaughter (her life)      Diet: eats all food groups   Caffeine: 1-2 cups at times, but does not tolerate it all the time   Water: 160 oz daily      Wears seat belt    Does not use phone while driving    Oceanographer at home   Fire extinguisher-no   No weapons   Social Determinants of Health   Financial Resource Strain: [place]   . Difficulty of Paying Living Expenses: Not hard at all  Food Insecurity: No Food Insecurity  . Worried About Charity fundraiser in  the Last Year: Never true  . Ran Out of Food in the Last Year: Never true  Transportation Needs: No Transportation Needs  . Lack of Transportation (Medical): No  . Lack of Transportation (Non-Medical): No  Physical Activity:   . Days of Exercise per Week: Not on file  . Minutes of Exercise per Session: Not on file  Stress: Stress Concern Present  . Feeling of Stress : Rather much  Social Connections: Socially Isolated  . Frequency of Communication with Friends and Family: More than three times a week  . Frequency of Social Gatherings with Friends and Family: More than three times a week  . Attends Religious Services: Never  . Active Member of Clubs or Organizations: No  . Attends Archivist Meetings: Never  . Marital Status: Widowed  Intimate Partner Violence: Not At Risk  . Fear of Current or Ex-Partner: No  . Emotionally Abused: No  . Physically Abused: No  . Sexually Abused: No    FAMILY HISTORY:  Family History  Problem  Relation Age of Onset  . Hypertension Father   . Heart disease Father   . Cancer Father        lung  . Hypertension Mother   . Cancer Sister        ovarian cancer    CURRENT MEDICATIONS:  Current Outpatient Medications  Medication Sig Dispense Refill  . amLODipine (NORVASC) 5 MG tablet Take 1 tablet (5 mg total) by mouth daily. 90 tablet 1  . atorvastatin (LIPITOR) 20 MG tablet Take 1 tablet (20 mg total) by mouth daily. 90 tablet 1  . CARBOPLATIN IV Inject into the vein every 21 ( twenty-one) days.    . CONSTULOSE 10 GM/15ML solution Take 30 g by mouth daily as needed.     . dicyclomine (BENTYL) 20 MG tablet Take 1 tablet (20 mg total) by mouth every 6 (six) hours. 30 tablet 1  . docusate sodium (COLACE) 100 MG capsule Take 200 mg by mouth 2 (two) times daily as needed for mild constipation (when taking pain medication).    . gabapentin (NEURONTIN) 300 MG capsule Take 1 capsule (300 mg total) by mouth 3 (three) times daily. 90 capsule 3  . ibuprofen (ADVIL) 200 MG tablet Take 200 mg by mouth every 6 (six) hours as needed.     . lidocaine-prilocaine (EMLA) cream Apply small amount to port a cath site and cover with plastic wrap one hour prior to chemotherapy appointments 30 g 3  . oxyCODONE-acetaminophen (PERCOCET) 5-325 MG tablet Take 1 tablet by mouth every 8 (eight) hours as needed (pain). 30 tablet 0  . PACLITAXEL IV Inject into the vein every 21 ( twenty-one) days.    . polyethylene glycol (MIRALAX / GLYCOLAX) 17 g packet Take 17 g by mouth 2 (two) times daily as needed.     . prochlorperazine (COMPAZINE) 10 MG tablet Take 1 tablet (10 mg total) by mouth every 6 (six) hours as needed (Nausea or vomiting). 30 tablet 1   No current facility-administered medications for this visit.    ALLERGIES:  Allergies  Allergen Reactions  . Nickel Rash    itchy    PHYSICAL EXAM:  Performance status (ECOG): 1 - Symptomatic but completely ambulatory  Vitals:   09/14/19 0852  BP: 123/82    Pulse: 89  Resp: 18  Temp: (!) 97.5 F (36.4 C)  SpO2: 96%   Wt Readings from Last 3 Encounters:  09/14/19 232 lb 4.8 oz (105.4 kg)  09/07/19 233 lb (105.7 kg)  08/24/19 233 lb 6.4 oz (105.9 kg)   Physical Exam Vitals reviewed.  Constitutional:      Appearance: Normal appearance. She is obese.  Cardiovascular:     Rate and Rhythm: Normal rate and regular rhythm.     Pulses: Normal pulses.     Heart sounds: Normal heart sounds.  Pulmonary:     Effort: Pulmonary effort is normal.     Breath sounds: Normal breath sounds.  Chest:     Comments: Port-a-Cath in R chest Abdominal:     Palpations: Abdomen is soft. There is no mass.     Tenderness: There is no abdominal tenderness.  Musculoskeletal:     Right lower leg: No edema.     Left lower leg: No edema.  Neurological:     General: No focal deficit present.     Mental Status: She is alert and oriented to person, place, and time.  Psychiatric:        Mood and Affect: Mood normal.        Behavior: Behavior normal.     LABORATORY DATA:  I have reviewed the labs as listed.  CBC Latest Ref Rng & Units 09/14/2019 08/24/2019 07/29/2019  WBC 4.0 - 10.5 K/uL 4.5 5.0 4.5  Hemoglobin 12.0 - 15.0 g/dL 10.6(L) 10.6(L) 9.9(L)  Hematocrit 36 - 46 % 32.1(L) 32.3(L) 29.9(L)  Platelets 150 - 400 K/uL 182 242 207   CMP Latest Ref Rng & Units 09/14/2019 08/24/2019 07/29/2019  Glucose 70 - 99 mg/dL 164(H) 111(H) 130(H)  BUN 6 - 20 mg/dL _0 Creatinine 0.44 - 1.00 mg/dL 0.55 0.61 0.53  Sodium 135 - 145 mmol/L 137 138 138  Potassium 3.5 - 5.1 mmol/L 3.4(L) 3.8 3.8  Chloride 98 - 111 mmol/L 106 103 105  CO2 22 - 32 mmol/L _1 Calcium 8.9 - 10.3 mg/dL 8.7(L) 9.2 9.0  Total Protein 6.5 - 8.1 g/dL 6.9 7.2 7.0  Total Bilirubin 0.3 - 1.2 mg/dL 0.3 0.5 0.5  Alkaline Phos 38 - 126 U/L 106 107 104  AST 15 - 41 U/L _2 ALT 0 - 44 U/L _3 No results found for: CA125  DIAGNOSTIC IMAGING:  I have independently reviewed the  scans and discussed with the patient. No results found.   ASSESSMENT:  1. Recurrent endometrial carcinosarcoma: -TAH, BSO, bilateral pelvic and para-aortic lymph node resections on 04/13/2018. -Pathology showed carcinosarcoma (malignant mixed mullerian tumor) is arising in an endometrial type polyp with no myometrial invasion identified. High-grade. 0/39 lymph nodes positive. PT1APN0, FIGO stage Ia. MMR normal. MSI-stable. -CTAP on 12/09/2018 for abdominal pain showed extensive peritoneal carcinomatosis and large soft tissue mass in the pelvis. -Biopsy of the omental mass on 12/28/2018 shows poorly differentiated carcinoma consistent with her prior malignancy. -Carboplatin and paclitaxel started on 12/29/2018. -CT scan on 05/02/2019 showed peritoneal implants in the low central small bowel mesentery and left pelvic sidewall have decreased in size measuring 1.5 x 1.9 cm and 1.7 x 2.2 cm. Soft tissue nodule in the left lateral omentum measures 1 x 1.7 cm with no evidence of metastatic disease. -Continuation of chemotherapy until complete response was recommended. -CT AP on 07/04/2019 showed continued positive response to therapy with scattered peritoneal metastasis in the pelvis and left omentum decreased in size. No new metastatic disease.  2. Breast abnormality: -Mammogram on 05/02/2019 shows BI-RADS Category 0. She reportedly took Covid shot 1 to 2 weeks prior  to the mammogram. Further scans rescheduled on 06/21/2019.  3. Peripheral neuropathy: -She reported numbness and occasional pins-and-needles sensation in the bilateral toes, right more than left. This has started about a week ago.   PLAN:  1. Recurrent endometrial carcinosarcoma: -She tolerated last cycle very well.  Right-sided abdominal pain has improved. -I reviewed her labs.  LFTs are normal.  White count and platelets are normal.  CA-125 was 5.3. -She will proceed with her next cycle today.  I plan to repeat CT AP prior  to next visit in [redacted] weeks along with CA-125 level.  2.  Low back pain/right-sided abdominal pain: -Continue Percocet as needed.  3. Hypertension: -Continue Norvasc 5 mg daily.  4. Neuropathy: -Continue gabapentin 300 mg twice daily.  3 times a day will cause her drowsiness.   Orders placed this encounter:  Orders Placed This Encounter  Procedures  . CT Abdomen Pelvis W Wo Contrast     [name], MD [place] [phone]   I, Milinda Antis, am acting as a scribe for Dr. Sanda Linger.  I, [name] MD, have reviewed the above documentation for accuracy and completeness, and I agree with the above.

## 2019-09-14 NOTE — Patient Instructions (Signed)
[facility] Discharge Instructions for Patients Receiving Chemotherapy  Today you received the following chemotherapy agents   To help prevent nausea and vomiting after your treatment, we encourage you to take your nausea medication   If you develop nausea and vomiting that is not controlled by your nausea medication, call the clinic.   BELOW ARE SYMPTOMS THAT SHOULD BE REPORTED IMMEDIATELY:  *FEVER GREATER THAN 100.5 F  *CHILLS WITH OR WITHOUT FEVER  NAUSEA AND VOMITING THAT IS NOT CONTROLLED WITH YOUR NAUSEA MEDICATION  *UNUSUAL SHORTNESS OF BREATH  *UNUSUAL BRUISING OR BLEEDING  TENDERNESS IN MOUTH AND THROAT WITH OR WITHOUT PRESENCE OF ULCERS  *URINARY PROBLEMS  *BOWEL PROBLEMS  UNUSUAL RASH Items with * indicate a potential emergency and should be followed up as soon as possible.  Feel free to call the clinic should you have any questions or concerns. The clinic phone number is [phone].  Please show the CHEMO ALERT CARD at check-in to the Emergency Department and triage nurse.   

## 2019-09-15 LAB — CA 125: Cancer Antigen (CA) 125: 6.1 U/mL (ref 0.0–38.1)

## 2019-09-16 ENCOUNTER — Other Ambulatory Visit: Payer: Self-pay

## 2019-09-16 ENCOUNTER — Inpatient Hospital Stay (HOSPITAL_COMMUNITY): Payer: Medicaid Other

## 2019-09-16 VITALS — BP 123/73 | HR 58 | Temp 96.9°F | Resp 18

## 2019-09-16 DIAGNOSIS — C541 Malignant neoplasm of endometrium: Secondary | ICD-10-CM

## 2019-09-16 DIAGNOSIS — Z5111 Encounter for antineoplastic chemotherapy: Secondary | ICD-10-CM | POA: Diagnosis not present

## 2019-09-16 DIAGNOSIS — Z95828 Presence of other vascular implants and grafts: Secondary | ICD-10-CM

## 2019-09-16 MED ORDER — PEGFILGRASTIM-CBQV 6 MG/0.6ML ~~LOC~~ SOSY
6.0000 mg | PREFILLED_SYRINGE | Freq: Once | SUBCUTANEOUS | Status: AC
  Administered 2019-09-16: 6 mg via SUBCUTANEOUS
  Filled 2019-09-16: qty 0.6

## 2019-09-16 NOTE — Progress Notes (Signed)
Patient tolerated Udencya injection with no complaints voiced.  Site clean and dry with no bruising or swelling noted.  No complaints of pain.  Discharged with vital signs stable and no signs or symptoms of distress noted.  

## 2019-10-03 ENCOUNTER — Ambulatory Visit (HOSPITAL_COMMUNITY): Admission: RE | Admit: 2019-10-03 | Payer: Medicaid Other | Source: Ambulatory Visit

## 2019-10-05 ENCOUNTER — Inpatient Hospital Stay (HOSPITAL_COMMUNITY): Payer: Medicaid Other | Attending: Hematology

## 2019-10-05 ENCOUNTER — Inpatient Hospital Stay (HOSPITAL_COMMUNITY): Payer: Medicaid Other

## 2019-10-05 ENCOUNTER — Inpatient Hospital Stay (HOSPITAL_BASED_OUTPATIENT_CLINIC_OR_DEPARTMENT_OTHER): Payer: Medicaid Other | Admitting: Hematology

## 2019-10-05 ENCOUNTER — Encounter (HOSPITAL_COMMUNITY): Payer: Self-pay | Admitting: Emergency Medicine

## 2019-10-05 ENCOUNTER — Encounter (HOSPITAL_COMMUNITY): Payer: Self-pay

## 2019-10-05 ENCOUNTER — Other Ambulatory Visit: Payer: Self-pay

## 2019-10-05 ENCOUNTER — Emergency Department (HOSPITAL_COMMUNITY)
Admission: EM | Admit: 2019-10-05 | Discharge: 2019-10-06 | Disposition: A | Discharge status: Home / Self Care | Payer: Medicaid Other | Attending: Emergency Medicine | Admitting: Emergency Medicine

## 2019-10-05 VITALS — BP 144/79 | HR 85 | Temp 97.0°F | Resp 18 | Wt 233.6 lb

## 2019-10-05 DIAGNOSIS — C786 Secondary malignant neoplasm of retroperitoneum and peritoneum: Secondary | ICD-10-CM | POA: Diagnosis present

## 2019-10-05 DIAGNOSIS — T7840XA Allergy, unspecified, initial encounter: Secondary | ICD-10-CM

## 2019-10-05 DIAGNOSIS — E785 Hyperlipidemia, unspecified: Secondary | ICD-10-CM | POA: Diagnosis not present

## 2019-10-05 DIAGNOSIS — M545 Low back pain: Secondary | ICD-10-CM | POA: Insufficient documentation

## 2019-10-05 DIAGNOSIS — R110 Nausea: Secondary | ICD-10-CM | POA: Insufficient documentation

## 2019-10-05 DIAGNOSIS — Z9079 Acquired absence of other genital organ(s): Secondary | ICD-10-CM | POA: Insufficient documentation

## 2019-10-05 DIAGNOSIS — Z79899 Other long term (current) drug therapy: Secondary | ICD-10-CM | POA: Insufficient documentation

## 2019-10-05 DIAGNOSIS — Z5189 Encounter for other specified aftercare: Secondary | ICD-10-CM | POA: Insufficient documentation

## 2019-10-05 DIAGNOSIS — Z95828 Presence of other vascular implants and grafts: Secondary | ICD-10-CM

## 2019-10-05 DIAGNOSIS — G629 Polyneuropathy, unspecified: Secondary | ICD-10-CM | POA: Diagnosis not present

## 2019-10-05 DIAGNOSIS — Z5111 Encounter for antineoplastic chemotherapy: Secondary | ICD-10-CM | POA: Diagnosis not present

## 2019-10-05 DIAGNOSIS — Z8041 Family history of malignant neoplasm of ovary: Secondary | ICD-10-CM | POA: Diagnosis not present

## 2019-10-05 DIAGNOSIS — I10 Essential (primary) hypertension: Secondary | ICD-10-CM | POA: Diagnosis not present

## 2019-10-05 DIAGNOSIS — Z90710 Acquired absence of both cervix and uterus: Secondary | ICD-10-CM | POA: Insufficient documentation

## 2019-10-05 DIAGNOSIS — Z90722 Acquired absence of ovaries, bilateral: Secondary | ICD-10-CM | POA: Diagnosis not present

## 2019-10-05 DIAGNOSIS — C541 Malignant neoplasm of endometrium: Secondary | ICD-10-CM | POA: Insufficient documentation

## 2019-10-05 DIAGNOSIS — Z801 Family history of malignant neoplasm of trachea, bronchus and lung: Secondary | ICD-10-CM | POA: Diagnosis not present

## 2019-10-05 DIAGNOSIS — R109 Unspecified abdominal pain: Secondary | ICD-10-CM | POA: Insufficient documentation

## 2019-10-05 DIAGNOSIS — Z8249 Family history of ischemic heart disease and other diseases of the circulatory system: Secondary | ICD-10-CM | POA: Insufficient documentation

## 2019-10-05 LAB — CBC WITH DIFFERENTIAL/PLATELET
Abs Immature Granulocytes: 0.01 10*3/uL (ref 0.00–0.07)
Basophils Absolute: 0 10*3/uL (ref 0.0–0.1)
Basophils Relative: 1 %
Eosinophils Absolute: 0 10*3/uL (ref 0.0–0.5)
Eosinophils Relative: 1 %
HCT: 31.2 % — ABNORMAL LOW (ref 36.0–46.0)
Hemoglobin: 10.2 g/dL — ABNORMAL LOW (ref 12.0–15.0)
Immature Granulocytes: 0 %
Lymphocytes Relative: 47 %
Lymphs Abs: 2.1 10*3/uL (ref 0.7–4.0)
MCH: 34 pg (ref 26.0–34.0)
MCHC: 32.7 g/dL (ref 30.0–36.0)
MCV: 104 fL — ABNORMAL HIGH (ref 80.0–100.0)
Monocytes Absolute: 0.5 10*3/uL (ref 0.1–1.0)
Monocytes Relative: 11 %
Neutro Abs: 1.8 10*3/uL (ref 1.7–7.7)
Neutrophils Relative %: 40 %
Platelets: 141 10*3/uL — ABNORMAL LOW (ref 150–400)
RBC: 3 MIL/uL — ABNORMAL LOW (ref 3.87–5.11)
RDW: 14.6 % (ref 11.5–15.5)
WBC: 4.4 10*3/uL (ref 4.0–10.5)
nRBC: 0 % (ref 0.0–0.2)

## 2019-10-05 LAB — COMPREHENSIVE METABOLIC PANEL
ALT: 27 U/L (ref 0–44)
AST: 21 U/L (ref 15–41)
Albumin: 3.9 g/dL (ref 3.5–5.0)
Alkaline Phosphatase: 103 U/L (ref 38–126)
Anion gap: 9 (ref 5–15)
BUN: 14 mg/dL (ref 6–20)
CO2: 24 mmol/L (ref 22–32)
Calcium: 8.8 mg/dL — ABNORMAL LOW (ref 8.9–10.3)
Chloride: 108 mmol/L (ref 98–111)
Creatinine, Ser: 0.61 mg/dL (ref 0.44–1.00)
GFR calc Af Amer: >60 mL/min (ref >60)
GFR calc non Af Amer: >60 mL/min (ref >60)
Glucose, Bld: 109 mg/dL — ABNORMAL HIGH (ref 70–99)
Potassium: 3.6 mmol/L (ref 3.5–5.1)
Sodium: 141 mmol/L (ref 135–145)
Total Bilirubin: 0.5 mg/dL (ref 0.3–1.2)
Total Protein: 6.9 g/dL (ref 6.5–8.1)

## 2019-10-05 MED ORDER — SODIUM CHLORIDE 0.9 % IV SOLN
10.0000 mg | Freq: Once | INTRAVENOUS | Status: AC
  Administered 2019-10-05: 10 mg via INTRAVENOUS
  Filled 2019-10-05: qty 10

## 2019-10-05 MED ORDER — SODIUM CHLORIDE 0.9% FLUSH: 10.0000 mL | INTRAVENOUS | Status: DC | PRN

## 2019-10-05 MED ORDER — PREDNISONE 10 MG PO TABS: ORAL_TABLET | ORAL | 0 refills | Status: DC

## 2019-10-05 MED ORDER — SODIUM CHLORIDE 0.9 % IV SOLN
140.0000 mg/m2 | Freq: Once | INTRAVENOUS | Status: AC
  Administered 2019-10-05: 288 mg via INTRAVENOUS
  Filled 2019-10-05: qty 48

## 2019-10-05 MED ORDER — HEPARIN SOD (PORK) LOCK FLUSH 100 UNIT/ML IV SOLN: 500.0000 [IU] | Freq: Once | INTRAVENOUS | Status: DC | PRN

## 2019-10-05 MED ORDER — DIPHENHYDRAMINE-ZINC ACETATE 2-0.1 % EX CREA
TOPICAL_CREAM | Freq: Once | CUTANEOUS | Status: AC
  Filled 2019-10-05: qty 28

## 2019-10-05 MED ORDER — SODIUM CHLORIDE 0.9 % IV SOLN: Freq: Once | INTRAVENOUS | Status: AC

## 2019-10-05 MED ORDER — DIPHENHYDRAMINE HCL 50 MG/ML IJ SOLN
INTRAMUSCULAR | Status: AC
  Filled 2019-10-05: qty 1

## 2019-10-05 MED ORDER — SODIUM CHLORIDE 0.9 % IV SOLN
862.8000 mg | Freq: Once | INTRAVENOUS | Status: AC
  Administered 2019-10-05: 860 mg via INTRAVENOUS
  Filled 2019-10-05: qty 86

## 2019-10-05 MED ORDER — PALONOSETRON HCL INJECTION 0.25 MG/5ML
0.2500 mg | Freq: Once | INTRAVENOUS | Status: AC
  Administered 2019-10-05: 0.25 mg via INTRAVENOUS

## 2019-10-05 MED ORDER — PALONOSETRON HCL INJECTION 0.25 MG/5ML
INTRAVENOUS | Status: AC
  Filled 2019-10-05: qty 5

## 2019-10-05 MED ORDER — DIPHENHYDRAMINE HCL 50 MG/ML IJ SOLN
25.0000 mg | Freq: Once | INTRAMUSCULAR | Status: AC
  Administered 2019-10-05: 25 mg via INTRAVENOUS

## 2019-10-05 MED ORDER — DIPHENHYDRAMINE HCL 25 MG PO CAPS: 25.0000 mg | ORAL_CAPSULE | Freq: Four times a day (QID) | ORAL | Status: DC | PRN

## 2019-10-05 MED ORDER — FAMOTIDINE IN NACL 20-0.9 MG/50ML-% IV SOLN
INTRAVENOUS | Status: AC
  Filled 2019-10-05: qty 50

## 2019-10-05 MED ORDER — METHYLPREDNISOLONE SODIUM SUCC 125 MG IJ SOLR
125.0000 mg | Freq: Once | INTRAMUSCULAR | Status: AC | PRN
  Administered 2019-10-05: 125 mg via INTRAVENOUS

## 2019-10-05 MED ORDER — FAMOTIDINE IN NACL 20-0.9 MG/50ML-% IV SOLN
20.0000 mg | Freq: Once | INTRAVENOUS | Status: AC
  Administered 2019-10-05: 20 mg via INTRAVENOUS

## 2019-10-05 MED ORDER — SODIUM CHLORIDE 0.9 % IV SOLN
150.0000 mg | Freq: Once | INTRAVENOUS | Status: AC
  Administered 2019-10-05: 150 mg via INTRAVENOUS
  Filled 2019-10-05: qty 150

## 2019-10-05 MED ORDER — DIPHENHYDRAMINE HCL 50 MG/ML IJ SOLN
50.0000 mg | Freq: Once | INTRAMUSCULAR | Status: AC
  Administered 2019-10-05: 50 mg via INTRAVENOUS

## 2019-10-05 MED ORDER — PROCHLORPERAZINE EDISYLATE 10 MG/2ML IJ SOLN
10.0000 mg | Freq: Once | INTRAMUSCULAR | Status: AC
  Administered 2019-10-05: 10 mg via INTRAVENOUS

## 2019-10-05 MED ORDER — EPINEPHRINE 0.3 MG/0.3ML IJ SOAJ: 0.3000 mg | INTRAMUSCULAR | 0 refills | Status: DC | PRN

## 2019-10-05 NOTE — Patient Instructions (Signed)
[facility] at [facility] Discharge Instructions  You were seen today by Dr. Katragadda. He went over your recent results. You received your treatment today. Dr. Katragadda will see you back in 3 weeks for labs and follow up.   Thank you for choosing [facility] at [facility] to provide your oncology and hematology care.  To afford each patient quality time with our provider, please arrive at least 15 minutes before your scheduled appointment time.   If you have a lab appointment with the Cancer Center please come in thru the Main Entrance and check in at the main information desk  You need to re-schedule your appointment should you arrive 10 or more minutes late.  We strive to give you quality time with our providers, and arriving late affects you and other patients whose appointments are after yours.  Also, if you no show three or more times for appointments you may be dismissed from the clinic at the providers discretion.     Again, thank you for choosing [facility].  Our hope is that these requests will decrease the amount of time that you wait before being seen by our physicians.       _____________________________________________________________  Should you have questions after your visit to [facility], please contact our office at [phone] between the hours of 8:00 a.m. and 4:30 p.m.  Voicemails left after 4:00 p.m. will not be returned until the following business day.  For prescription refill requests, have your pharmacy contact our office and allow 72 hours.    Cancer Center Support Programs:   > Cancer Support Group  2nd Tuesday of the month 1pm-2pm, Journey Room    

## 2019-10-05 NOTE — Discharge Instructions (Addendum)
Take your next dose of prednisone tomorrow evening.  You have also been prescribed an EpiPen.  Keep this handy and use only in the event that you develop wheezing, shortness of breath, mouth base or throat swelling in association with this allergic reaction.  I also recommend taking 25 mg of Benadryl every 6 hours if you are itching.  Return here for any worsening symptoms.

## 2019-10-05 NOTE — Progress Notes (Signed)
Patient presents today for treatment and follow up visit with Dr. Delton Coombes. Labs within parameters for treatment and reviewed by Dr. Delton Coombes. Vital signs within parameters for treatment.   14:47 pm Patient called out for a nurse. Upon entering room patient has complaints of not feeling well, body on fire and stomach burning.   14:48 pm Carboplatin stopped at this time. Normal Saline bolus initiated. BP 102/61, 98.9, HR 99, 02 Sats 95%, respirations 20. Patient diaphoretic and nauseated. Dr. Delton Coombes at the bedside.   14:49 pm 125 mg/ 2 ml  of Solu-Medrol given IV push.  14:51 pm 25 mg of Benadryl given IV push.    14:51 pm 10 mg Compazine given IV push.   14:52 pm BP 98/65, HR 80, Sats 92% on 3 L of 02 via Nasal Canula. Respirations 20.   14:55 pm 95/52, HR 106, Sats 92%, Respirations 18  14:57 pm 98/53, HR 105, Sats 95%, Respirations 17  15:00 pm 77/56, HR 99, Sats 93%, Respirations 18  15:05 Patient taken to ER via stretcher. Report given to [place]. ER assumed care of patient at this time.

## 2019-10-05 NOTE — Patient Instructions (Signed)
[facility] Discharge Instructions for Patients Receiving Chemotherapy  Today you received the following chemotherapy agents   To help prevent nausea and vomiting after your treatment, we encourage you to take your nausea medication   If you develop nausea and vomiting that is not controlled by your nausea medication, call the clinic.   BELOW ARE SYMPTOMS THAT SHOULD BE REPORTED IMMEDIATELY:  *FEVER GREATER THAN 100.5 F  *CHILLS WITH OR WITHOUT FEVER  NAUSEA AND VOMITING THAT IS NOT CONTROLLED WITH YOUR NAUSEA MEDICATION  *UNUSUAL SHORTNESS OF BREATH  *UNUSUAL BRUISING OR BLEEDING  TENDERNESS IN MOUTH AND THROAT WITH OR WITHOUT PRESENCE OF ULCERS  *URINARY PROBLEMS  *BOWEL PROBLEMS  UNUSUAL RASH Items with * indicate a potential emergency and should be followed up as soon as possible.  Feel free to call the clinic should you have any questions or concerns. The clinic phone number is [phone].  Please show the CHEMO ALERT CARD at check-in to the Emergency Department and triage nurse.   

## 2019-10-05 NOTE — Patient Instructions (Signed)
[facility] at [facility] Discharge Instructions  Labs drawn from portacath today   Thank you for choosing [facility] at [facility] to provide your oncology and hematology care.  To afford each patient quality time with our provider, please arrive at least 15 minutes before your scheduled appointment time.   If you have a lab appointment with the Cancer Center please come in thru the Main Entrance and check in at the main information desk.  You need to re-schedule your appointment should you arrive 10 or more minutes late.  We strive to give you quality time with our providers, and arriving late affects you and other patients whose appointments are after yours.  Also, if you no show three or more times for appointments you may be dismissed from the clinic at the providers discretion.     Again, thank you for choosing [facility].  Our hope is that these requests will decrease the amount of time that you wait before being seen by our physicians.       _____________________________________________________________  Should you have questions after your visit to [facility], please contact our office at [phone] and follow the prompts.  Our office hours are 8:00 a.m. and 4:30 p.m. Monday - Friday.  Please note that voicemails left after 4:00 p.m. may not be returned until the following business day.  We are closed weekends and major holidays.  You do have access to a nurse 24-7, just call the main number to the clinic [phone] and do not press any options, hold on the line and a nurse will answer the phone.    For prescription refill requests, have your pharmacy contact our office and allow 72 hours.    Due to Covid, you will need to wear a mask upon entering the hospital. If you do not have a mask, a mask will be given to you at the Main Entrance upon arrival. For doctor visits, patients may have 1 support person age 18  or older with them. For treatment visits, patients can not have anyone with them due to social distancing guidelines and our immunocompromised population.     

## 2019-10-05 NOTE — ED Triage Notes (Signed)
Pt from [place].  Had allergic reaction 8 minutes into Carboplatin.  Pt has been receiving Taxol and Carboplatin Every 21 days since December 2020 at [facility].  This is the first allergic reaction since getting treatment.  Pt received pre-meds Pecid, Benadryl,Aloxi, Decadron, and Emend.  Pt had completed her Taxol and Carboplatin was started at 1440.  Pt called to nurses station, c/o feeling hot, stomach on fire and itching to both hands.  Carboplatin was stopped at 1448 and pt was given 500 ml NS bolus, at 1449 given solumedrol 125 mg, Benadryl 25 mg at 1451 and Compazine at 1452.  Pt was placed on O2 @3l /m and Sats were 92-95% on floor.  VS currently, BP 117/63, NSR 64, resp 16 and Sats 95% on O2@3l /m via n/c.

## 2019-10-05 NOTE — Progress Notes (Signed)
Jessica [name], Jessica [name]   CLINIC:  Medical Oncology/Hematology  PCP:  Perlie Mayo, NP [address] / [place] [address] [phone]   REASON FOR VISIT:  Follow-up for recurrent endometrial cancer  PRIOR THERAPY: TAH & BSO, bilateral pelvic and para-aortic lymph nodes dissections on 04/13/2018  NGS Results: Not done  CURRENT THERAPY: Carboplatin, paclitaxel & Aloxi every 3 weeks  BRIEF ONCOLOGIC HISTORY:  Oncology History  Endometrial cancer ([place])  03/31/2018 Initial Diagnosis   Endometrial cancer ([place])   12/29/2018 -  Chemotherapy   The patient had palonosetron (ALOXI) injection 0.25 mg, 0.25 mg, Intravenous,  Once, 13 of 14 cycles Administration: 0.25 mg (12/29/2018), 0.25 mg (01/19/2019), 0.25 mg (02/09/2019), 0.25 mg (03/02/2019), 0.25 mg (03/23/2019), 0.25 mg (04/13/2019), 0.25 mg (05/04/2019), 0.25 mg (05/25/2019), 0.25 mg (06/15/2019), 0.25 mg (07/06/2019), 0.25 mg (07/29/2019), 0.25 mg (08/24/2019), 0.25 mg (09/14/2019) pegfilgrastim-cbqv (UDENYCA) injection 6 mg, 6 mg, Subcutaneous, Once, 13 of 14 cycles Administration: 6 mg (12/31/2018), 6 mg (01/20/2019), 6 mg (02/11/2019), 6 mg (03/04/2019), 6 mg (03/25/2019), 6 mg (04/15/2019), 6 mg (05/06/2019), 6 mg (05/27/2019), 6 mg (06/17/2019), 6 mg (07/08/2019), 6 mg (08/01/2019), 6 mg (08/26/2019) CARBOplatin (PARAPLATIN) 860 mg in sodium chloride 0.9 % 500 mL chemo infusion, 860 mg (100 % of original dose 862.8 mg), Intravenous,  Once, 13 of 14 cycles Dose modification:   (original dose 862.8 mg, Cycle 1),   (original dose 862.8 mg, Cycle 2),   (original dose 862.8 mg, Cycle 3),   (original dose 862.8 mg, Cycle 4),   (original dose 862.8 mg, Cycle 5),   (original dose 862.8 mg, Cycle 6),   (original dose 862.8 mg, Cycle 8),   (original dose 862.8 mg, Cycle 8) Administration: 860 mg (12/29/2018), 860 mg (01/19/2019), 860 mg (02/09/2019), 860 mg (03/02/2019), 860 mg (03/23/2019), 860 mg (04/13/2019), 860 mg  (05/04/2019), 860 mg (05/25/2019), 860 mg (06/15/2019), 860 mg (07/06/2019), 860 mg (07/29/2019), 860 mg (08/24/2019), 860 mg (09/14/2019) fosaprepitant (EMEND) 150 mg in sodium chloride 0.9 % 145 mL IVPB, 150 mg, Intravenous,  Once, 13 of 14 cycles Administration: 150 mg (01/19/2019), 150 mg (02/09/2019), 150 mg (03/02/2019), 150 mg (03/23/2019), 150 mg (04/13/2019), 150 mg (05/04/2019), 150 mg (05/25/2019), 150 mg (06/15/2019), 150 mg (07/06/2019), 150 mg (07/29/2019), 150 mg (08/24/2019), 150 mg (09/14/2019) PACLitaxel (TAXOL) 360 mg in sodium chloride 0.9 % 500 mL chemo infusion (> 2m/m2), 175 mg/m2 = 360 mg, Intravenous,  Once, 13 of 14 cycles Dose modification: 140 mg/m2 (80 % of original dose 175 mg/m2, Cycle 7, Reason: Other (see comments), Comment: hand pains), 140 mg/m2 (80 % of original dose 175 mg/m2, Cycle 8, Reason: Other (see comments), Comment: neoropathic pain) Administration: 360 mg (12/29/2018), 360 mg (01/19/2019), 360 mg (02/09/2019), 360 mg (03/02/2019), 360 mg (03/23/2019), 360 mg (04/13/2019), 288 mg (05/04/2019), 288 mg (05/25/2019), 288 mg (06/15/2019), 288 mg (07/06/2019), 288 mg (07/29/2019), 288 mg (08/24/2019), 288 mg (09/14/2019)  for chemotherapy treatment.      CANCER STAGING: Cancer Staging No matching staging information was found for the patient.  INTERVAL HISTORY:  Ms. [name] a 59y.o. female, returns for routine follow-up and consideration for next cycle of chemotherapy. [name] last seen on 09/14/2019.  Due for day #14 of cycle #1 of carboplatin and paclitaxel today.   Overall, she tells me she has been feeling good. She complains of having right hip and leg pain which wakes her up at night; the  pain shoots down her right leg and her right foot has been swelling up recently. The pain started 2 weeks ago and she took Percocet, which helped the pain go down some but not completely. She continues having constant numbness and tingling in her right foot, which is annoying her, but no pain;  it is stable with gabapentin TID. She reports having N/V/D for 5 days after chemo. She also reports having an intermittent sore on her upper left lip which comes and goes.  Overall, she feels ready for next cycle of chemo today.    REVIEW OF SYSTEMS:  Review of Systems  Constitutional: Positive for fatigue (severe). Negative for appetite change.  HENT:   Positive for mouth sores (on lip).   Gastrointestinal: Positive for diarrhea (x5 days post chemo), nausea (x5 days post chemo) and vomiting (x5 days post chemo).  Genitourinary: Positive for difficulty urinating (retention).   Musculoskeletal: Positive for arthralgias (7/10 R hip and leg pain).  Neurological: Positive for dizziness, headaches and numbness (& tingling constantly in R foot).  Psychiatric/Behavioral: Positive for depression and sleep disturbance. The patient is nervous/anxious.   All other systems reviewed and are negative.   PAST MEDICAL/SURGICAL HISTORY:  Past Medical History:  Diagnosis Date  . Cancer ([place])    Phreesia 09/05/2019  . Elevated blood pressure reading   . Endometrial cancer ([place])   . Hyperlipidemia   . Hypertension   . PMB (postmenopausal bleeding)   . Port-A-Cath in place 12/24/2018   Past Surgical History:  Procedure Laterality Date  . ABDOMINAL HYSTERECTOMY N/A    Phreesia 09/05/2019  . IR IMAGING GUIDED PORT INSERTION  12/28/2018  . IR RADIOLOGIST EVAL & MGMT  06/02/2018  . IR RADIOLOGIST EVAL & MGMT  06/16/2018  . NECK SURGERY  2000   spinal surgery , titanium rod in place   . ROBOTIC ASSISTED TOTAL HYSTERECTOMY WITH BILATERAL SALPINGO OOPHERECTOMY N/A 04/13/2018   Procedure: XI ROBOTIC ASSISTED TOTAL HYSTERECTOMY WITH BILATERAL SALPINGO OOPHORECTOMY;  Surgeon: Everitt Amber, MD;  Location: [facility];  Service: Gynecology;  Laterality: N/A;  . ROBOTIC PELVIC AND PARA-AORTIC LYMPH NODE DISSECTION N/A 04/13/2018   Procedure: XI ROBOTIC PELVIC LYMPHADECTOMY AND PARA-AORTIC LYMPH NODE DISSECTION;  Surgeon:  Everitt Amber, MD;  Location: [facility];  Service: Gynecology;  Laterality: N/A;    SOCIAL HISTORY:  Social History   Socioeconomic History  . Marital status: Widowed    Spouse name: Not on file  . Number of children: 3  . Years of education: Not on file  . Highest education level: Not on file  Occupational History  . 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 Smoking status: Never Smoker  . Smokeless tobacco: Never Used  Vaping Use  . Vaping Use: Never used  Substance and Sexual Activity  . Alcohol use: Yes    Alcohol/week: 4.0 standard drinks    Types: 4 Glasses of wine per week    Comment: weekends  . Drug use: Never  . Sexual activity: Not Currently  Other Topics Concern  . Not on file  Social History Narrative   Lives with daughter and granddaughter here in [place], moved 3 years ago      Cats: Zoe-5 years old, Eduard Clos, and Horald Pollen      Enjoys: walking when she can tolerate- spending time with granddaughter (her life)      Diet: eats all food groups   Caffeine: 1-2 cups at times, but does not tolerate  it all the time   Water: 160 oz daily      Wears seat belt    Does not use phone while driving    Smoke detectors at home   Fire extinguisher-no   No weapons   Social Determinants of Health   Financial Resource Strain: Low Risk   . Difficulty of Paying Living Expenses: Not hard at all  Food Insecurity: No Food Insecurity  . Worried About Charity fundraiser in the Last Year: Never true  . Ran Out of Food in the Last Year: Never true  Transportation Needs: No Transportation Needs  . Lack of Transportation (Medical): No  . Lack of Transportation (Non-Medical): No  Physical Activity:   . Days of Exercise per Week: Not on file  . Minutes of Exercise per Session: Not on file  Stress: Stress Concern Present  . Feeling of Stress : Rather much  Social Connections: Socially Isolated  . Frequency of Communication with Friends and Family: More than  three times a week  . Frequency of Social Gatherings with Friends and Family: More than three times a week  . Attends Religious Services: Never  . Active Member of Clubs or Organizations: No  . Attends Archivist Meetings: Never  . Marital Status: Widowed  Intimate Partner Violence: Not At Risk  . Fear of Current or Ex-Partner: No  . Emotionally Abused: No  . Physically Abused: No  . Sexually Abused: No    FAMILY HISTORY:  Family History  Problem Relation Age of Onset  . Hypertension Father   . Heart disease Father   . Cancer Father        lung  . Hypertension Mother   . Cancer Sister        ovarian cancer    CURRENT MEDICATIONS:  Current Outpatient Medications  Medication Sig Dispense Refill  . amLODipine (NORVASC) 5 MG tablet Take 1 tablet (5 mg total) by mouth daily. 90 tablet 1  . atorvastatin (LIPITOR) 20 MG tablet Take 1 tablet (20 mg total) by mouth daily. 90 tablet 1  . CARBOPLATIN IV Inject into the vein every 21 ( twenty-one) days.    . CONSTULOSE 10 GM/15ML solution Take 30 g by mouth daily as needed.     . dicyclomine (BENTYL) 20 MG tablet Take 1 tablet (20 mg total) by mouth every 6 (six) hours. 30 tablet 1  . docusate sodium (COLACE) 100 MG capsule Take 200 mg by mouth 2 (two) times daily as needed for mild constipation (when taking pain medication).    . gabapentin (NEURONTIN) 300 MG capsule Take 1 capsule (300 mg total) by mouth 3 (three) times daily. 90 capsule 3  . ibuprofen (ADVIL) 200 MG tablet Take 200 mg by mouth every 6 (six) hours as needed.     . lidocaine-prilocaine (EMLA) cream Apply small amount to port a cath site and cover with plastic wrap one hour prior to chemotherapy appointments 30 g 3  . oxyCODONE-acetaminophen (PERCOCET) 5-325 MG tablet Take 1 tablet by mouth every 8 (eight) hours as needed (pain). 30 tablet 0  . PACLITAXEL IV Inject into the vein every 21 ( twenty-one) days.    . polyethylene glycol (MIRALAX / GLYCOLAX) 17 g  packet Take 17 g by mouth 2 (two) times daily as needed.     . prochlorperazine (COMPAZINE) 10 MG tablet Take 1 tablet (10 mg total) by mouth every 6 (six) hours as needed (Nausea or vomiting). 30 tablet 1  No current facility-administered medications for this visit.    ALLERGIES:  Allergies  Allergen Reactions  . Nickel Rash    itchy    PHYSICAL EXAM:  Performance status (ECOG): 1 - Symptomatic but completely ambulatory  Vitals:   10/05/19 0829  BP: (!) 144/79  Pulse: 85  Resp: 18  Temp: (!) 97 F (36.1 C)  SpO2: 98%   Wt Readings from Last 3 Encounters:  10/05/19 233 lb 9.6 oz (106 kg)  09/14/19 232 lb 4.8 oz (105.4 kg)  09/07/19 233 lb (105.7 kg)   Physical Exam Vitals reviewed.  Constitutional:      Appearance: Normal appearance. She is obese.  HENT:     Mouth/Throat:     Lips: Lesions (sore on upper left lip) present.  Cardiovascular:     Rate and Rhythm: Normal rate and regular rhythm.     Pulses: Normal pulses.     Heart sounds: Normal heart sounds.  Pulmonary:     Effort: Pulmonary effort is normal.     Breath sounds: Normal breath sounds.  Chest:     Comments: Port-a-Cath in R chest Musculoskeletal:     Right lower leg: No edema.     Left lower leg: No edema.  Neurological:     General: No focal deficit present.     Mental Status: She is alert and oriented to person, place, and time.  Psychiatric:        Mood and Affect: Mood normal.        Behavior: Behavior normal.     LABORATORY DATA:  I have reviewed the labs as listed.  CBC Latest Ref Rng & Units 10/05/2019 09/14/2019 08/24/2019  WBC 4.0 - 10.5 K/uL 4.4 4.5 5.0  Hemoglobin 12.0 - 15.0 g/dL 10.2(L) 10.6(L) 10.6(L)  Hematocrit 36 - 46 % 31.2(L) 32.1(L) 32.3(L)  Platelets 150 - 400 K/uL 141(L) 182 242   CMP Latest Ref Rng & Units 10/05/2019 09/14/2019 08/24/2019  Glucose 70 - 99 mg/dL 109(H) 164(H) 111(H)  BUN 6 - 20 mg/dL _0 Creatinine 0.44 - 1.00 mg/dL 0.61 0.55 0.61  Sodium 135 - 145  mmol/L 141 137 138  Potassium 3.5 - 5.1 mmol/L 3.6 3.4(L) 3.8  Chloride 98 - 111 mmol/L 108 106 103  CO2 22 - 32 mmol/L _1 Calcium 8.9 - 10.3 mg/dL 8.8(L) 8.7(L) 9.2  Total Protein 6.5 - 8.1 g/dL 6.9 6.9 7.2  Total Bilirubin 0.3 - 1.2 mg/dL 0.5 0.3 0.5  Alkaline Phos 38 - 126 U/L 103 106 107  AST 15 - 41 U/L _2 ALT 0 - 44 U/L _3 No results found for: CA125   DIAGNOSTIC IMAGING:  I have independently reviewed the scans and discussed with the patient. No results found.   ASSESSMENT:  1. Recurrent endometrial carcinosarcoma: -TAH, BSO, bilateral pelvic and para-aortic lymph node resections on 04/13/2018. -Pathology showed carcinosarcoma (malignant mixed mullerian tumor) is arising in an endometrial type polyp with no myometrial invasion identified. High-grade. 0/39 lymph nodes positive. PT1APN0, FIGO stage Ia. MMR normal. MSI-stable. -CTAP on 12/09/2018 for abdominal pain showed extensive peritoneal carcinomatosis and large soft tissue mass in the pelvis. -Biopsy of the omental mass on 12/28/2018 shows poorly differentiated carcinoma consistent with her prior malignancy. -Carboplatin and paclitaxel started on 12/29/2018. -CT scan on 05/02/2019 showed peritoneal implants in the low central small bowel mesentery and left pelvic sidewall have decreased in size measuring 1.5 x 1.9 cm and 1.7  x 2.2 cm. Soft tissue nodule in the left lateral omentum measures 1 x 1.7 cm with no evidence of metastatic disease. -Continuation of chemotherapy until complete response was recommended. -CT AP on 07/04/2019 showed continued positive response to therapy with scattered peritoneal metastasis in the pelvis and left omentum decreased in size. No new metastatic disease.  2. Breast abnormality: -Mammogram on 05/02/2019 shows BI-RADS Category 0. She reportedly took Covid shot 1 to 2 weeks prior to the mammogram. Further scans rescheduled on 06/21/2019.  3. Peripheral neuropathy: -She  reported numbness and occasional pins-and-needles sensation in the bilateral toes, right more than left. This has started about a week ago.   PLAN:  1. Recurrent endometrial carcinosarcoma: -She has tolerated last cycle very well.  I have reviewed her LFTs which are within normal limits. -Platelet count is slightly low at 141. -She has developed right hip pain about 1 week ago which radiated to the leg.  Today she has no pain. -She will proceed with her treatment today.  Paclitaxel is dose reduced at 140 mg per metered squared. -She will come back in 3 weeks for follow-up with a PET scan.  2.Low back pain/right-sided abdominal pain: -Continue Percocet as needed.  3. Hypertension: -Continue Norvasc 5 mg daily.  4. Neuropathy: -She has tingling in her right foot which is stable. -Continue gabapentin 300 mg twice daily.  3 times a day causes drowsiness.   Orders placed this encounter:  No orders of the defined types were placed in this encounter.    [name], MD [place] [phone]   I, Milinda Antis, am acting as a scribe for Dr. Sanda Linger.  I, [name] MD, have reviewed the above documentation for accuracy and completeness, and I agree with the above.

## 2019-10-05 NOTE — ED Provider Notes (Signed)
[facility] EMERGENCY DEPARTMENT Provider Note   CSN: [id number] Arrival date & time: 10/05/19  1512     History Chief Complaint  Patient presents with  . Allergic Reaction    Jessica [name] is a 59 y.o. female who is currently being treated at our cancer center for endometrial cancer, took her 14th Taxol and carboplatin treatment this morning and developed an allergic reaction described as an all over burning sensation in association with feeling like her stomach was on "fire".  She receives pretreatment of Pepcid, Benadryl, Aloxi and Decadron along with Emend for nausea prior to her treatment, this was received approximately 10 AM this morning.  At 1448 today her treatment was stopped given the symptoms and she was given Solu-Medrol 125 and Benadryl 25 mg along with Compazine.  She had a low oxygen saturation in the 92-95 range, but denies having shortness of breath.  She did have some nausea which is now improved.  She had a low blood pressure and was given 500 cc of normal saline and transported here for further management.  She feels improved at this time, her only complaint is persistent itching of her hands, also has generalized fatigue.  She denies shortness of breath, cough, wheezing, chest tightness or nausea. HPI     Past Medical History:  Diagnosis Date  . Cancer ([place])    Phreesia 09/05/2019  . Elevated blood pressure reading   . Endometrial cancer ([place])   . Hyperlipidemia   . Hypertension   . PMB (postmenopausal bleeding)   . Port-A-Cath in place 12/24/2018    Patient Active Problem List   Diagnosis Date Noted  . Depression, major, single episode, moderate ([place]) 09/07/2019  . Anxiety 09/07/2019  . Port-A-Cath in place 12/24/2018  . Goals of care, counseling/discussion 12/23/2018  . 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[place]) 12/14/2018  . Elevated LFTs 05/05/2018  . Hyperlipidemia 04/27/2018  . Essential hypertension 04/26/2018  . Endometrial cancer  ([place]) 03/31/2018    Past Surgical History:  Procedure Laterality Date  . ABDOMINAL HYSTERECTOMY N/A    Phreesia 09/05/2019  . IR IMAGING GUIDED PORT INSERTION  12/28/2018  . IR RADIOLOGIST EVAL & MGMT  06/02/2018  . IR RADIOLOGIST EVAL & MGMT  06/16/2018  . NECK SURGERY  2000   spinal surgery , titanium rod in place   . ROBOTIC ASSISTED TOTAL HYSTERECTOMY WITH BILATERAL SALPINGO OOPHERECTOMY N/A 04/13/2018   Procedure: XI ROBOTIC ASSISTED TOTAL HYSTERECTOMY WITH BILATERAL SALPINGO OOPHORECTOMY;  Surgeon: Everitt Amber, MD;  Location: [facility];  Service: Gynecology;  Laterality: N/A;  . ROBOTIC PELVIC AND PARA-AORTIC LYMPH NODE DISSECTION N/A 04/13/2018   Procedure: XI ROBOTIC PELVIC LYMPHADECTOMY AND PARA-AORTIC LYMPH NODE DISSECTION;  Surgeon: Everitt Amber, MD;  Location: [facility];  Service: Gynecology;  Laterality: N/A;     OB History    Gravida  4   Para  3   Term  3   Preterm  0   AB  1   Living  3     SAB  0   TAB  1   Ectopic  0   Multiple  0   Live Births  3           Family History  Problem Relation Age of Onset  . Hypertension Father   . Heart disease Father   . Cancer Father        lung  . Hypertension Mother   . Cancer Sister  ovarian cancer    Social History   Tobacco Use  . Smoking status: Never Smoker  . Smokeless tobacco: Never Used  Vaping Use  . Vaping Use: Never used  Substance Use Topics  . Alcohol use: Yes    Alcohol/week: 4.0 standard drinks    Types: 4 Glasses of wine per week    Comment: weekends  . Drug use: Never    Home Medications Prior to Admission medications   Medication Sig Start Date End Date Taking? Authorizing Provider  CARBOPLATIN IV Inject into the vein every 21 ( twenty-one) days. 12/29/18  Yes [provider]  PACLITAXEL IV Inject into the vein every 21 ( twenty-one) days. 12/29/18  Yes [provider]  amLODipine (NORVASC) 5 MG tablet Take 1 tablet (5 mg total) by mouth daily. 09/07/19    Perlie Mayo, NP  atorvastatin (LIPITOR) 20 MG tablet Take 1 tablet (20 mg total) by mouth daily. 09/07/19   Perlie Mayo, NP  CONSTULOSE 10 GM/15ML solution Take 30 g by mouth daily as needed.  12/29/18   [provider]  dicyclomine (BENTYL) 20 MG tablet Take 1 tablet (20 mg total) by mouth every 6 (six) hours. 02/09/19   [name], MD  docusate sodium (COLACE) 100 MG capsule Take 200 mg by mouth 2 (two) times daily as needed for mild constipation (when taking pain medication).    [provider]  EPINEPHrine 0.3 mg/0.3 mL IJ SOAJ injection Inject 0.3 mg into the muscle as needed for anaphylaxis. 10/05/19   Evalee Jefferson, PA-C  gabapentin (NEURONTIN) 300 MG capsule Take 1 capsule (300 mg total) by mouth 3 (three) times daily. 07/29/19   [name], MD  ibuprofen (ADVIL) 200 MG tablet Take 200 mg by mouth every 6 (six) hours as needed.     [provider]  lidocaine-prilocaine (EMLA) cream Apply small amount to port a cath site and cover with plastic wrap one hour prior to chemotherapy appointments 02/09/19   [name], MD  oxyCODONE-acetaminophen (PERCOCET) 5-325 MG tablet Take 1 tablet by mouth every 8 (eight) hours as needed (pain). 08/24/19   [name], MD  polyethylene glycol (MIRALAX / GLYCOLAX) 17 g packet Take 17 g by mouth 2 (two) times daily as needed.     [provider]  predniSONE (DELTASONE) 10 MG tablet Take 6 tablets day one, 5 tablets day two, 4 tablets day three, 3 tablets day four, 2 tablets day five, then 1 tablet day six 10/05/19   Brianca Fortenberry, Almyra Free, PA-C  prochlorperazine (COMPAZINE) 10 MG tablet Take 1 tablet (10 mg total) by mouth every 6 (six) hours as needed (Nausea or vomiting). 02/09/19   [name], MD    Allergies    Nickel  Review of Systems   Review of Systems  Constitutional: Negative for chills and fever.  HENT: Negative for congestion and sore throat.   Eyes: Negative.   Respiratory:  Negative for chest tightness and shortness of breath.   Cardiovascular: Negative for chest pain.  Gastrointestinal: Positive for abdominal pain and nausea.  Genitourinary: Negative.   Musculoskeletal: Negative for arthralgias, joint swelling and neck pain.  Skin: Positive for color change. Negative for rash and wound.  Neurological: Negative for dizziness, weakness, light-headedness, numbness and headaches.  Psychiatric/Behavioral: Negative.     Physical Exam Updated Vital Signs BP 113/68   Pulse (!) 50   Temp 98 F (36.7 C) (Oral)   Resp 14   Ht 5\' 4"  (1.626 m)  Wt 105.7 kg   SpO2 100%   BMI 39.99 kg/m   Physical Exam Vitals and nursing note reviewed.  Constitutional:      Appearance: She is well-developed.  HENT:     Head: Normocephalic and atraumatic.     Mouth/Throat:     Pharynx: Oropharynx is clear. No pharyngeal swelling or posterior oropharyngeal erythema.  Eyes:     Conjunctiva/sclera: Conjunctivae normal.  Cardiovascular:     Rate and Rhythm: Normal rate and regular rhythm.     Heart sounds: Normal heart sounds.  Pulmonary:     Effort: Pulmonary effort is normal. No respiratory distress.     Breath sounds: Normal breath sounds. No stridor. No wheezing.  Abdominal:     General: Bowel sounds are normal.     Palpations: Abdomen is soft.     Tenderness: There is no abdominal tenderness.  Musculoskeletal:        General: Normal range of motion.     Cervical back: Normal range of motion.  Skin:    General: Skin is warm and dry.     Findings: Erythema present. No rash.     Comments: Skin has mild generalized erythema.  There are no hives present.  Neurological:     General: No focal deficit present.     Mental Status: She is alert.     ED Results / Procedures / Treatments   Labs (all labs ordered are listed, but only abnormal results are displayed) Labs Reviewed - No data to display  EKG EKG Interpretation  Date/Time:  Wednesday October 05 2019  15:23:58 EDT Ventricular Rate:  81 PR Interval:    QRS Duration: 100 QT Interval:  424 QTC Calculation: 493 R Axis:   2 Text Interpretation: Sinus rhythm Prolonged PR interval Low voltage, precordial leads Borderline T abnormalities, anterior leads No STEMI Confirmed by Nanda Quinton [phone]) on 10/05/2019 3:29:14 PM   Radiology No results found.  Procedures Procedures (including critical care time)  Medications Ordered in ED Medications  diphenhydrAMINE-zinc acetate (BENADRYL) 2-0.1 % cream ( Topical Given 10/05/19 1802)    ED Course  I have reviewed the triage vital signs and the nursing notes.  Pertinent labs & imaging results that were available during my care of the patient were reviewed by me and considered in my medical decision making (see chart for details).    MDM Rules/Calculators/A&P                          Pt with allergic reaction to chemotherapy taxol and carboplatin despite multiple previous exposures.  She was treated with steroids and benadryl prior to arrival.  She was also given topical benadryl for hand itching.  Observed in department with no worsening sx and felt improved, including hands.  She was dispo'd home with strict return precautions. Prescribed prednisone taper to start tomorrow, benadryl q6 prn itching.  Also prescribed epi pen and instructed in indications for its use.  Pt understands treatment plan and was stable at dc.  Final Clinical Impression(s) / ED Diagnoses Final diagnoses:  Allergic reaction, initial encounter    Rx / DC Orders ED Discharge Orders         Ordered    predniSONE (DELTASONE) 10 MG tablet        10/05/19 1746    EPINEPHrine 0.3 mg/0.3 mL IJ SOAJ injection  As needed        10/05/19 1746  Evalee Jefferson, PA-C 10/05/19 1914    Margette Fast, MD 10/10/19 1626

## 2019-10-05 NOTE — Progress Notes (Signed)
Patient was assessed by Dr. Katragadda and labs have been reviewed.  Patient is okay to proceed with treatment today. Primary RN and pharmacy aware.   

## 2019-10-05 NOTE — Progress Notes (Signed)
48- daughter called to notify patient was sent to the Emergency room. Gave her the number to call and find out info on her mom.

## 2019-10-06 LAB — CA 125: Cancer Antigen (CA) 125: 6 U/mL (ref 0.0–38.1)

## 2019-10-07 ENCOUNTER — Other Ambulatory Visit: Payer: Self-pay

## 2019-10-07 ENCOUNTER — Inpatient Hospital Stay (HOSPITAL_COMMUNITY): Payer: Medicaid Other

## 2019-10-07 VITALS — BP 156/76 | HR 76 | Resp 18

## 2019-10-07 DIAGNOSIS — C541 Malignant neoplasm of endometrium: Secondary | ICD-10-CM

## 2019-10-07 DIAGNOSIS — Z5111 Encounter for antineoplastic chemotherapy: Secondary | ICD-10-CM | POA: Diagnosis not present

## 2019-10-07 DIAGNOSIS — Z95828 Presence of other vascular implants and grafts: Secondary | ICD-10-CM

## 2019-10-07 MED ORDER — PEGFILGRASTIM-CBQV 6 MG/0.6ML ~~LOC~~ SOSY
6.0000 mg | PREFILLED_SYRINGE | Freq: Once | SUBCUTANEOUS | Status: AC
  Administered 2019-10-07: 6 mg via SUBCUTANEOUS

## 2019-10-07 MED ORDER — PEGFILGRASTIM-CBQV 6 MG/0.6ML ~~LOC~~ SOSY
PREFILLED_SYRINGE | SUBCUTANEOUS | Status: AC
  Filled 2019-10-07: qty 0.6

## 2019-10-07 NOTE — Progress Notes (Signed)
Patient tolerated Udenyca injection with no complaints voiced.  Site clean and dry with no bruising or swelling noted.  No complaints of pain.  Discharged with vital signs stable and no signs or symptoms of distress noted.  

## 2019-10-10 ENCOUNTER — Encounter: Payer: Self-pay | Admitting: Pharmacist

## 2019-10-14 ENCOUNTER — Ambulatory Visit (INDEPENDENT_AMBULATORY_CARE_PROVIDER_SITE_OTHER): Payer: Medicaid Other

## 2019-10-14 ENCOUNTER — Other Ambulatory Visit: Payer: Self-pay

## 2019-10-14 DIAGNOSIS — Z23 Encounter for immunization: Secondary | ICD-10-CM

## 2019-10-20 ENCOUNTER — Other Ambulatory Visit (HOSPITAL_COMMUNITY): Payer: Self-pay | Admitting: *Deleted

## 2019-10-20 ENCOUNTER — Other Ambulatory Visit: Payer: Self-pay

## 2019-10-20 ENCOUNTER — Inpatient Hospital Stay (HOSPITAL_BASED_OUTPATIENT_CLINIC_OR_DEPARTMENT_OTHER): Payer: Medicaid Other | Admitting: Nurse Practitioner

## 2019-10-20 VITALS — BP 125/47 | HR 94 | Temp 96.9°F | Resp 20 | Wt 234.1 lb

## 2019-10-20 DIAGNOSIS — C541 Malignant neoplasm of endometrium: Secondary | ICD-10-CM

## 2019-10-20 DIAGNOSIS — R2241 Localized swelling, mass and lump, right lower limb: Secondary | ICD-10-CM | POA: Diagnosis not present

## 2019-10-20 DIAGNOSIS — Z5111 Encounter for antineoplastic chemotherapy: Secondary | ICD-10-CM | POA: Diagnosis not present

## 2019-10-20 NOTE — Progress Notes (Signed)
[place], [place] [address]   CLINIC:  Medical Oncology/Hematology  PCP:  Perlie Mayo, NP [place] [address] [phone]   REASON FOR VISIT: Follow-up for endometrial cancer following up today for a right lower lobe nodule   BRIEF ONCOLOGIC HISTORY:  Oncology History  Endometrial cancer ([facility])  03/31/2018 Initial Diagnosis   Endometrial cancer ([facility])   12/29/2018 -  Chemotherapy   The patient had palonosetron (ALOXI) injection 0.25 mg, 0.25 mg, Intravenous,  Once, 14 of 16 cycles Administration: 0.25 mg (12/29/2018), 0.25 mg (01/19/2019), 0.25 mg (02/09/2019), 0.25 mg (03/02/2019), 0.25 mg (03/23/2019), 0.25 mg (04/13/2019), 0.25 mg (05/04/2019), 0.25 mg (05/25/2019), 0.25 mg (06/15/2019), 0.25 mg (07/06/2019), 0.25 mg (07/29/2019), 0.25 mg (08/24/2019), 0.25 mg (09/14/2019), 0.25 mg (10/05/2019) pegfilgrastim-cbqv (UDENYCA) injection 6 mg, 6 mg, Subcutaneous, Once, 14 of 16 cycles Administration: 6 mg (12/31/2018), 6 mg (01/20/2019), 6 mg (02/11/2019), 6 mg (03/04/2019), 6 mg (03/25/2019), 6 mg (04/15/2019), 6 mg (05/06/2019), 6 mg (05/27/2019), 6 mg (06/17/2019), 6 mg (07/08/2019), 6 mg (08/01/2019), 6 mg (08/26/2019), 6 mg (09/16/2019), 6 mg (10/07/2019) CARBOplatin (PARAPLATIN) 860 mg in sodium chloride 0.9 % 500 mL chemo infusion, 860 mg (100 % of original dose 862.8 mg), Intravenous,  Once, 14 of 16 cycles Dose modification:   (original dose 862.8 mg, Cycle 1),   (original dose 862.8 mg, Cycle 2),   (original dose 862.8 mg, Cycle 3),   (original dose 862.8 mg, Cycle 4),   (original dose 862.8 mg, Cycle 5),   (original dose 862.8 mg, Cycle 6),   (original dose 862.8 mg, Cycle 8),   (original dose 862.8 mg, Cycle 8) Administration: 860 mg (12/29/2018), 860 mg (01/19/2019), 860 mg (02/09/2019), 860 mg (03/02/2019), 860 mg (03/23/2019), 860 mg (04/13/2019), 860 mg (05/04/2019), 860 mg (05/25/2019), 860 mg (06/15/2019), 860 mg (07/06/2019), 860 mg (07/29/2019), 860 mg  (08/24/2019), 860 mg (09/14/2019), 860 mg (10/05/2019) fosaprepitant (EMEND) 150 mg in sodium chloride 0.9 % 145 mL IVPB, 150 mg, Intravenous,  Once, 14 of 16 cycles Administration: 150 mg (01/19/2019), 150 mg (02/09/2019), 150 mg (03/02/2019), 150 mg (03/23/2019), 150 mg (04/13/2019), 150 mg (05/04/2019), 150 mg (05/25/2019), 150 mg (06/15/2019), 150 mg (07/06/2019), 150 mg (07/29/2019), 150 mg (08/24/2019), 150 mg (09/14/2019), 150 mg (10/05/2019) PACLitaxel (TAXOL) 360 mg in sodium chloride 0.9 % 500 mL chemo infusion (> 11m/m2), 175 mg/m2 = 360 mg, Intravenous,  Once, 14 of 16 cycles Dose modification: 140 mg/m2 (80 % of original dose 175 mg/m2, Cycle 7, Reason: Other (see comments), Comment: hand pains), 140 mg/m2 (80 % of original dose 175 mg/m2, Cycle 8, Reason: Other (see comments), Comment: neoropathic pain) Administration: 360 mg (12/29/2018), 360 mg (01/19/2019), 360 mg (02/09/2019), 360 mg (03/02/2019), 360 mg (03/23/2019), 360 mg (04/13/2019), 288 mg (05/04/2019), 288 mg (05/25/2019), 288 mg (06/15/2019), 288 mg (07/06/2019), 288 mg (07/29/2019), 288 mg (08/24/2019), 288 mg (09/14/2019), 288 mg (10/05/2019)  for chemotherapy treatment.       INTERVAL HISTORY:  Ms. TLaflam89y.o. female returns for routine follow-up for endometrial cancer however she is following up today for right lower lobe nodule.  She reports it is warm to the touch and very painful.  It appeared about 5 days ago.  It is normal in color. Denies any nausea, vomiting, or diarrhea. Denies any new pains. Had not noticed any recent bleeding such as epistaxis, hematuria or hematochezia. Denies recent chest pain on exertion, shortness of breath on minimal exertion, pre-syncopal  episodes, or palpitations. Denies any numbness or tingling in hands or feet. Denies any recent fevers, infections, or recent hospitalizations.      REVIEW OF SYSTEMS:  Review of Systems  Skin:       Right lower lobe nodule  All other systems reviewed and are negative.    PAST  MEDICAL/SURGICAL HISTORY:  Past Medical History:  Diagnosis Date   Cancer ([facility])    Phreesia 09/05/2019   Elevated blood pressure reading    Endometrial cancer (HCC)    Hyperlipidemia    Hypertension    PMB (postmenopausal bleeding)    Port-A-Cath in place 12/24/2018   Past Surgical History:  Procedure Laterality Date   ABDOMINAL HYSTERECTOMY N/A    Phreesia 09/05/2019   IR IMAGING GUIDED PORT INSERTION  12/28/2018   IR RADIOLOGIST EVAL & MGMT  06/02/2018   IR RADIOLOGIST EVAL & MGMT  06/16/2018   NECK SURGERY  2000   spinal surgery , titanium rod in place    ROBOTIC ASSISTED TOTAL HYSTERECTOMY WITH BILATERAL SALPINGO OOPHERECTOMY N/A 04/13/2018   Procedure: XI ROBOTIC [place];  Surgeon: Everitt Amber, MD;  Location: [facility];  Service: Gynecology;  Laterality: N/A;   ROBOTIC PELVIC AND PARA-AORTIC LYMPH NODE DISSECTION N/A 04/13/2018   Procedure: XI ROBOTIC PELVIC LYMPHADECTOMY AND PARA-AORTIC LYMPH NODE DISSECTION;  Surgeon: Everitt Amber, MD;  Location: [facility];  Service: Gynecology;  Laterality: N/A;     SOCIAL HISTORY:  Social History   Socioeconomic History   Marital status: Widowed    Spouse name: Not on file   Number of children: 3   Years of education: Not on file   Highest education level: Not on file  Occupational History   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  Tobacco Use   Smoking status: Never Smoker   Smokeless tobacco: Never Used  Vaping Use   Vaping Use: Never used  Substance and Sexual Activity   Alcohol use: Yes    Alcohol/week: 4.0 standard drinks    Types: 4 Glasses of wine per week    Comment: weekends   Drug use: Never   Sexual activity: Not Currently  Other Topics Concern   Not on file  Social History Narrative   Lives with daughter and granddaughter here in [place], moved 3 years ago      Cats: Zoe-36 years old, Eduard Clos, and Storm      Enjoys: walking when  she can tolerate- spending time with granddaughter (her life)      Diet: eats all food groups   Caffeine: 1-2 cups at times, but does not tolerate it all the time   Water: 160 oz daily      Wears seat belt    Does not use phone while driving    Oceanographer at home   Fire extinguisher-no   No weapons   Social Determinants of Health   Financial Resource Strain: Low Risk    Difficulty of Paying Living Expenses: Not hard at all  Food Insecurity: No Food Insecurity   Worried About Charity fundraiser in the Last Year: Never true   Arboriculturist in the Last Year: Never true  Transportation Needs: No Transportation Needs   Lack of Transportation (Medical): No   Lack of Transportation (Non-Medical): No  Physical Activity:    Days of Exercise per Week: Not on file   Minutes of Exercise per Session: Not on file  Stress: Stress  Concern Present   Feeling of Stress : Rather much  Social Connections: Socially Isolated   Frequency of Communication with Friends and Family: More than three times a week   Frequency of Social Gatherings with Friends and Family: More than three times a week   Attends Religious Services: Never   Marine scientist or Organizations: No   Attends Archivist Meetings: Never   Marital Status: Widowed  Human resources officer Violence: Not At Risk   Fear of Current or Ex-Partner: No   Emotionally Abused: No   Physically Abused: No   Sexually Abused: No    FAMILY HISTORY:  Family History  Problem Relation Age of Onset   Hypertension Father    Heart disease Father    Cancer Father        lung   Hypertension Mother    Cancer Sister        ovarian cancer    CURRENT MEDICATIONS:  Outpatient Encounter Medications as of 10/20/2019  Medication Sig   amLODipine (NORVASC) 5 MG tablet Take 1 tablet (5 mg total) by mouth daily.   atorvastatin (LIPITOR) 20 MG tablet Take 1 tablet (20 mg total) by mouth daily.   CARBOPLATIN IV  Inject into the vein every 21 ( twenty-one) days.   CONSTULOSE 10 GM/15ML solution Take 30 g by mouth daily as needed.    dicyclomine (BENTYL) 20 MG tablet Take 1 tablet (20 mg total) by mouth every 6 (six) hours.   docusate sodium (COLACE) 100 MG capsule Take 200 mg by mouth 2 (two) times daily as needed for mild constipation (when taking pain medication).   EPINEPHrine 0.3 mg/0.3 mL IJ SOAJ injection Inject 0.3 mg into the muscle as needed for anaphylaxis.   gabapentin (NEURONTIN) 300 MG capsule Take 1 capsule (300 mg total) by mouth 3 (three) times daily.   ibuprofen (ADVIL) 200 MG tablet Take 200 mg by mouth every 6 (six) hours as needed.    lidocaine-prilocaine (EMLA) cream Apply small amount to port a cath site and cover with plastic wrap one hour prior to chemotherapy appointments   oxyCODONE-acetaminophen (PERCOCET) 5-325 MG tablet Take 1 tablet by mouth every 8 (eight) hours as needed (pain).   PACLITAXEL IV Inject into the vein every 21 ( twenty-one) days.   polyethylene glycol (MIRALAX / GLYCOLAX) 17 g packet Take 17 g by mouth 2 (two) times daily as needed.    predniSONE (DELTASONE) 10 MG tablet Take 6 tablets day one, 5 tablets day two, 4 tablets day three, 3 tablets day four, 2 tablets day five, then 1 tablet day six   prochlorperazine (COMPAZINE) 10 MG tablet Take 1 tablet (10 mg total) by mouth every 6 (six) hours as needed (Nausea or vomiting).   No facility-administered encounter medications on file as of 10/20/2019.    ALLERGIES:  Allergies  Allergen Reactions   Carboplatin Other (See Comments)    "body on fire and abdominal pain"   Nickel Rash    itchy     PHYSICAL EXAM:  ECOG Performance status: 1  Vitals:   10/20/19 1045  BP: (!) 125/47  Pulse: 94  Resp: 20  Temp: (!) 96.9 F (36.1 C)  SpO2: 97%   Filed Weights   10/20/19 1045  Weight: 234 lb 2.1 oz (106.2 kg)   Physical Exam Constitutional:      Appearance: Normal appearance. She is  normal weight.  Musculoskeletal:        General: Normal range of motion.  Skin:    Comments: Right lower leg nodule warm to the touch normal in color  Neurological:     Mental Status: She is alert and oriented to person, place, and time. Mental status is at baseline.  Psychiatric:        Mood and Affect: Mood normal.        Behavior: Behavior normal.        Thought Content: Thought content normal.        Judgment: Judgment normal.      LABORATORY DATA:  I have reviewed the labs as listed.  CBC    Component Value Date/Time   WBC 4.4 10/05/2019 0825   RBC 3.00 (L) 10/05/2019 0825   HGB 10.2 (L) 10/05/2019 0825   HCT 31.2 (L) 10/05/2019 0825   PLT 141 (L) 10/05/2019 0825   MCV 104.0 (H) 10/05/2019 0825   MCH 34.0 10/05/2019 0825   MCHC 32.7 10/05/2019 0825   RDW 14.6 10/05/2019 0825   LYMPHSABS 2.1 10/05/2019 0825   MONOABS 0.5 10/05/2019 0825   EOSABS 0.0 10/05/2019 0825   BASOSABS 0.0 10/05/2019 0825   CMP Latest Ref Rng & Units 10/05/2019 09/14/2019 08/24/2019  Glucose 70 - 99 mg/dL 109(H) 164(H) 111(H)  BUN 6 - 20 mg/dL 14 12 18   Creatinine 0.44 - 1.00 mg/dL 0.61 0.55 0.61  Sodium 135 - 145 mmol/L 141 137 138  Potassium 3.5 - 5.1 mmol/L 3.6 3.4(L) 3.8  Chloride 98 - 111 mmol/L 108 106 103  CO2 22 - 32 mmol/L 24 23 23   Calcium 8.9 - 10.3 mg/dL 8.8(L) 8.7(L) 9.2  Total Protein 6.5 - 8.1 g/dL 6.9 6.9 7.2  Total Bilirubin 0.3 - 1.2 mg/dL 0.5 0.3 0.5  Alkaline Phos 38 - 126 U/L 103 106 107  AST 15 - 41 U/L 21 17 18   ALT 0 - 44 U/L 27 18 19      ASSESSMENT & PLAN:  Endometrial cancer (HCC) 1.  Recurrent endometrial carcinosarcoma, HER-2 negative: -7 cycles of carboplatin and paclitaxel from 12/29/2018 through 05/04/2019. -CT scan on 05/02/2019 showed peritoneal implants in the low central small bowel mesentery and left pelvic sidewall have decreased in size measuring 1.5 x 1.9 cm and 1.7 x 2.2 cm.  Soft tissue nodule in the left lateral omentum measures 1 x 1.7 cm.  No new  evidence of metastatic disease. -CA-125 has normalized.  She met with Dr. Denman George who recommended continuing chemotherapy until complete response. -We will plan to reduce scans after every 3 cycles. -I reviewed her labs today.  White count and LFTs are normal.  Platelets are normal.  She will proceed with cycle 8 today.  2.  Right lower leg nodule: -Patient has a painful nodule appear on her right lower extremity.  It is warm to the touch.  It is normal in color. -We will obtain an ultrasound of her extremity.  3.  Leg pains: -She uses Percocet as needed.  4.  Breast abnormality: -Mammogram on 05/02/2019 shows BI-RADS Category 0.  She reportedly took Covid shots 1 to 2 weeks prior to the mammogram. -Further scans are rescheduled on 06/21/2019.     Orders placed this encounter:  Orders Placed This Encounter  Procedures   US Venous Img Lower Unilateral Right      Francene Finders, FNP-C [place] [phone]

## 2019-10-20 NOTE — Assessment & Plan Note (Signed)
1.  Recurrent endometrial carcinosarcoma, HER-2 negative: -7 cycles of carboplatin and paclitaxel from 12/29/2018 through 05/04/2019. -CT scan on 05/02/2019 showed peritoneal implants in the low central small bowel mesentery and left pelvic sidewall have decreased in size measuring 1.5 x 1.9 cm and 1.7 x 2.2 cm.  Soft tissue nodule in the left lateral omentum measures 1 x 1.7 cm.  No new evidence of metastatic disease. -CA-125 has normalized.  She met with Dr. Denman George who recommended continuing chemotherapy until complete response. -We will plan to reduce scans after every 3 cycles. -I reviewed her labs today.  White count and LFTs are normal.  Platelets are normal.  She will proceed with cycle 8 today.  2.  Right lower leg nodule: -Patient has a painful nodule appear on her right lower extremity.  It is warm to the touch.  It is normal in color. -We will obtain an ultrasound of her extremity.  3.  Leg pains: -She uses Percocet as needed.  4.  Breast abnormality: -Mammogram on 05/02/2019 shows BI-RADS Category 0.  She reportedly took Covid shots 1 to 2 weeks prior to the mammogram. -Further scans are rescheduled on 06/21/2019.

## 2019-10-21 ENCOUNTER — Ambulatory Visit (HOSPITAL_COMMUNITY)
Admission: RE | Admit: 2019-10-21 | Discharge: 2019-10-21 | Disposition: A | Discharge status: Home / Self Care | Payer: Medicaid Other | Source: Ambulatory Visit | Attending: Nurse Practitioner | Admitting: Nurse Practitioner

## 2019-10-21 DIAGNOSIS — R2241 Localized swelling, mass and lump, right lower limb: Secondary | ICD-10-CM | POA: Insufficient documentation

## 2019-10-24 ENCOUNTER — Other Ambulatory Visit: Payer: Self-pay

## 2019-10-24 ENCOUNTER — Ambulatory Visit (HOSPITAL_COMMUNITY)
Admission: RE | Admit: 2019-10-24 | Discharge: 2019-10-24 | Disposition: A | Discharge status: Home / Self Care | Payer: Medicaid Other | Source: Ambulatory Visit | Attending: Hematology | Admitting: Hematology

## 2019-10-24 DIAGNOSIS — C541 Malignant neoplasm of endometrium: Secondary | ICD-10-CM | POA: Diagnosis present

## 2019-10-24 MED ORDER — IOHEXOL 300 MG/ML  SOLN
100.0000 mL | Freq: Once | INTRAMUSCULAR | Status: AC | PRN
  Administered 2019-10-24: 100 mL via INTRAVENOUS

## 2019-10-26 ENCOUNTER — Inpatient Hospital Stay (HOSPITAL_COMMUNITY): Payer: Medicaid Other | Attending: Hematology

## 2019-10-26 ENCOUNTER — Other Ambulatory Visit: Payer: Self-pay

## 2019-10-26 ENCOUNTER — Inpatient Hospital Stay (HOSPITAL_COMMUNITY): Payer: Medicaid Other

## 2019-10-26 ENCOUNTER — Inpatient Hospital Stay (HOSPITAL_BASED_OUTPATIENT_CLINIC_OR_DEPARTMENT_OTHER): Payer: Medicaid Other | Admitting: Hematology

## 2019-10-26 ENCOUNTER — Encounter (HOSPITAL_COMMUNITY): Payer: Self-pay

## 2019-10-26 VITALS — BP 132/74 | HR 84 | Temp 96.8°F | Resp 18 | Wt 234.4 lb

## 2019-10-26 DIAGNOSIS — C541 Malignant neoplasm of endometrium: Secondary | ICD-10-CM

## 2019-10-26 DIAGNOSIS — Z90710 Acquired absence of both cervix and uterus: Secondary | ICD-10-CM | POA: Insufficient documentation

## 2019-10-26 DIAGNOSIS — C786 Secondary malignant neoplasm of retroperitoneum and peritoneum: Secondary | ICD-10-CM | POA: Diagnosis present

## 2019-10-26 DIAGNOSIS — Z801 Family history of malignant neoplasm of trachea, bronchus and lung: Secondary | ICD-10-CM | POA: Insufficient documentation

## 2019-10-26 DIAGNOSIS — Z7952 Long term (current) use of systemic steroids: Secondary | ICD-10-CM | POA: Diagnosis not present

## 2019-10-26 DIAGNOSIS — Z90722 Acquired absence of ovaries, bilateral: Secondary | ICD-10-CM | POA: Insufficient documentation

## 2019-10-26 DIAGNOSIS — Z791 Long term (current) use of non-steroidal anti-inflammatories (NSAID): Secondary | ICD-10-CM | POA: Diagnosis not present

## 2019-10-26 DIAGNOSIS — I10 Essential (primary) hypertension: Secondary | ICD-10-CM | POA: Diagnosis not present

## 2019-10-26 DIAGNOSIS — E785 Hyperlipidemia, unspecified: Secondary | ICD-10-CM | POA: Insufficient documentation

## 2019-10-26 DIAGNOSIS — Z8249 Family history of ischemic heart disease and other diseases of the circulatory system: Secondary | ICD-10-CM | POA: Insufficient documentation

## 2019-10-26 DIAGNOSIS — Z79899 Other long term (current) drug therapy: Secondary | ICD-10-CM | POA: Insufficient documentation

## 2019-10-26 DIAGNOSIS — Z8041 Family history of malignant neoplasm of ovary: Secondary | ICD-10-CM | POA: Insufficient documentation

## 2019-10-26 DIAGNOSIS — G629 Polyneuropathy, unspecified: Secondary | ICD-10-CM | POA: Diagnosis not present

## 2019-10-26 DIAGNOSIS — Z5111 Encounter for antineoplastic chemotherapy: Secondary | ICD-10-CM | POA: Diagnosis not present

## 2019-10-26 DIAGNOSIS — Z95828 Presence of other vascular implants and grafts: Secondary | ICD-10-CM

## 2019-10-26 LAB — CBC WITH DIFFERENTIAL/PLATELET
Abs Immature Granulocytes: 0.01 10*3/uL (ref 0.00–0.07)
Basophils Absolute: 0 10*3/uL (ref 0.0–0.1)
Basophils Relative: 1 %
Eosinophils Absolute: 0.1 10*3/uL (ref 0.0–0.5)
Eosinophils Relative: 1 %
HCT: 31 % — ABNORMAL LOW (ref 36.0–46.0)
Hemoglobin: 10.2 g/dL — ABNORMAL LOW (ref 12.0–15.0)
Immature Granulocytes: 0 %
Lymphocytes Relative: 37 %
Lymphs Abs: 1.7 10*3/uL (ref 0.7–4.0)
MCH: 34 pg (ref 26.0–34.0)
MCHC: 32.9 g/dL (ref 30.0–36.0)
MCV: 103.3 fL — ABNORMAL HIGH (ref 80.0–100.0)
Monocytes Absolute: 0.5 10*3/uL (ref 0.1–1.0)
Monocytes Relative: 10 %
Neutro Abs: 2.3 10*3/uL (ref 1.7–7.7)
Neutrophils Relative %: 51 %
Platelets: 232 10*3/uL (ref 150–400)
RBC: 3 MIL/uL — ABNORMAL LOW (ref 3.87–5.11)
RDW: 14.5 % (ref 11.5–15.5)
WBC: 4.6 10*3/uL (ref 4.0–10.5)
nRBC: 0 % (ref 0.0–0.2)

## 2019-10-26 LAB — COMPREHENSIVE METABOLIC PANEL
ALT: 27 U/L (ref 0–44)
AST: 20 U/L (ref 15–41)
Albumin: 3.8 g/dL (ref 3.5–5.0)
Alkaline Phosphatase: 94 U/L (ref 38–126)
Anion gap: 10 (ref 5–15)
BUN: 19 mg/dL (ref 6–20)
CO2: 24 mmol/L (ref 22–32)
Calcium: 9.4 mg/dL (ref 8.9–10.3)
Chloride: 103 mmol/L (ref 98–111)
Creatinine, Ser: 0.63 mg/dL (ref 0.44–1.00)
GFR calc non Af Amer: >60 mL/min (ref >60)
Glucose, Bld: 163 mg/dL — ABNORMAL HIGH (ref 70–99)
Potassium: 3.7 mmol/L (ref 3.5–5.1)
Sodium: 137 mmol/L (ref 135–145)
Total Bilirubin: 0.5 mg/dL (ref 0.3–1.2)
Total Protein: 6.9 g/dL (ref 6.5–8.1)

## 2019-10-26 MED ORDER — HEPARIN SOD (PORK) LOCK FLUSH 100 UNIT/ML IV SOLN
500.0000 [IU] | Freq: Once | INTRAVENOUS | Status: AC
  Administered 2019-10-26: 500 [IU] via INTRAVENOUS

## 2019-10-26 MED ORDER — SODIUM CHLORIDE 0.9% FLUSH
10.0000 mL | Freq: Once | INTRAVENOUS | Status: AC
  Administered 2019-10-26: 10 mL

## 2019-10-26 NOTE — Progress Notes (Signed)
Patients port flushed without difficulty.  Good blood return noted with no bruising or swelling noted at site.  Dressing applied.  Patient accessed for treatment.  

## 2019-10-26 NOTE — Progress Notes (Signed)
Patient presents today for treatment and follow up visit with Dr. Delton Coombes. Labs reviewed by MD. Vital signs within parameters for treatment. Patient has complaints of an area on her right leg that is raised and has a knot per patient's words. Patient states they did an u/s last week and the tech said it was not a clot per patient's words. Patient has complaints of back and hip pain bilateral that radiates down both  legs. Patient states the last visit with her NP they diagnosed as sciatic nerve pain.   Verbal order received from [facility] RN/ Dr. Delton Coombes NO TREATMENT today, patient to return in 2 weeks for treatment. Vital signs stable. No complaints at this time. Discharged from clinic ambulatory in stable condition. Alert and oriented x 3. F/U with [facility] as scheduled.

## 2019-10-26 NOTE — Patient Instructions (Signed)
[place] at [facility] Discharge Instructions  You were seen today by Dr. Delton Coombes. He went over your recent results. You did not received treatment today. Dr. Delton Coombes will see you back in 2 weeks for labs and follow up.   Thank you for choosing [place] at [facility] to provide your oncology and hematology care.  To afford each patient quality time with our provider, please arrive at least 15 minutes before your scheduled appointment time.   If you have a lab appointment with the [facility] please come in thru the Main Entrance and check in at the main information desk  You need to re-schedule your appointment should you arrive 10 or more minutes late.  We strive to give you quality time with our providers, and arriving late affects you and other patients whose appointments are after yours.  Also, if you no show three or more times for appointments you may be dismissed from the clinic at the providers discretion.     Again, thank you for choosing [facility].  Our hope is that these requests will decrease the amount of time that you wait before being seen by our physicians.       _____________________________________________________________  Should you have questions after your visit to [facility], please contact our office at [phone] between the hours of 8:00 a.m. and 4:30 p.m.  Voicemails left after 4:00 p.m. will not be returned until the following business day.  For prescription refill requests, have your pharmacy contact our office and allow 72 hours.    Cancer Center Support Programs:   > Cancer Support Group  2nd Tuesday of the month 1pm-2pm, Journey Room

## 2019-10-26 NOTE — Progress Notes (Signed)
Jessica [name], Jessica [name] [id number]   CLINIC:  Medical Oncology/Hematology  PCP:  Jessica Mayo, NP [address] / [place] [address] [phone]   REASON FOR VISIT:  Follow-up for recurrent endometrial cancer  PRIOR THERAPY: TAH & BSO, bilateral pelvic and para-aortic lymph nodes dissections on 04/13/2018  NGS Results: Not done  CURRENT THERAPY: Carboplatin, paclitaxel & Aloxi every 3 weeks  BRIEF ONCOLOGIC HISTORY:  Oncology History  Endometrial cancer ([place])  03/31/2018 Initial Diagnosis   Endometrial cancer ([place])   12/29/2018 -  Chemotherapy   The patient had palonosetron (ALOXI) injection 0.25 mg, 0.25 mg, Intravenous,  Once, 14 of 16 cycles Administration: 0.25 mg (12/29/2018), 0.25 mg (01/19/2019), 0.25 mg (02/09/2019), 0.25 mg (03/02/2019), 0.25 mg (03/23/2019), 0.25 mg (04/13/2019), 0.25 mg (05/04/2019), 0.25 mg (05/25/2019), 0.25 mg (06/15/2019), 0.25 mg (07/06/2019), 0.25 mg (07/29/2019), 0.25 mg (08/24/2019), 0.25 mg (09/14/2019), 0.25 mg (10/05/2019) pegfilgrastim-cbqv (UDENYCA) injection 6 mg, 6 mg, Subcutaneous, Once, 14 of 16 cycles Administration: 6 mg (12/31/2018), 6 mg (01/20/2019), 6 mg (02/11/2019), 6 mg (03/04/2019), 6 mg (03/25/2019), 6 mg (04/15/2019), 6 mg (05/06/2019), 6 mg (05/27/2019), 6 mg (06/17/2019), 6 mg (07/08/2019), 6 mg (08/01/2019), 6 mg (08/26/2019), 6 mg (09/16/2019), 6 mg (10/07/2019) CARBOplatin (PARAPLATIN) 860 mg in sodium chloride 0.9 % 500 mL chemo infusion, 860 mg (100 % of original dose 862.8 mg), Intravenous,  Once, 14 of 16 cycles Dose modification:   (original dose 862.8 mg, Cycle 1),   (original dose 862.8 mg, Cycle 2),   (original dose 862.8 mg, Cycle 3),   (original dose 862.8 mg, Cycle 4),   (original dose 862.8 mg, Cycle 5),   (original dose 862.8 mg, Cycle 6),   (original dose 862.8 mg, Cycle 8),   (original dose 862.8 mg, Cycle 8) Administration: 860 mg (12/29/2018), 860 mg (01/19/2019), 860 mg (02/09/2019), 860 mg  (03/02/2019), 860 mg (03/23/2019), 860 mg (04/13/2019), 860 mg (05/04/2019), 860 mg (05/25/2019), 860 mg (06/15/2019), 860 mg (07/06/2019), 860 mg (07/29/2019), 860 mg (08/24/2019), 860 mg (09/14/2019), 860 mg (10/05/2019) fosaprepitant (EMEND) 150 mg in sodium chloride 0.9 % 145 mL IVPB, 150 mg, Intravenous,  Once, 14 of 16 cycles Administration: 150 mg (01/19/2019), 150 mg (02/09/2019), 150 mg (03/02/2019), 150 mg (03/23/2019), 150 mg (04/13/2019), 150 mg (05/04/2019), 150 mg (05/25/2019), 150 mg (06/15/2019), 150 mg (07/06/2019), 150 mg (07/29/2019), 150 mg (08/24/2019), 150 mg (09/14/2019), 150 mg (10/05/2019) PACLitaxel (TAXOL) 360 mg in sodium chloride 0.9 % 500 mL chemo infusion (> 81m/m2), 175 mg/m2 = 360 mg, Intravenous,  Once, 14 of 16 cycles Dose modification: 140 mg/m2 (80 % of original dose 175 mg/m2, Cycle 7, Reason: Other (see comments), Comment: hand pains), 140 mg/m2 (80 % of original dose 175 mg/m2, Cycle 8, Reason: Other (see comments), Comment: neoropathic pain) Administration: 360 mg (12/29/2018), 360 mg (01/19/2019), 360 mg (02/09/2019), 360 mg (03/02/2019), 360 mg (03/23/2019), 360 mg (04/13/2019), 288 mg (05/04/2019), 288 mg (05/25/2019), 288 mg (06/15/2019), 288 mg (07/06/2019), 288 mg (07/29/2019), 288 mg (08/24/2019), 288 mg (09/14/2019), 288 mg (10/05/2019)  for chemotherapy treatment.      CANCER STAGING: Cancer Staging No matching staging information was found for the patient.  INTERVAL HISTORY:  Ms. [name] a 59y.o. female, returns for routine follow-up and consideration for next cycle of chemotherapy. SH[place]as last seen on 10/05/2019.  Due for cycle #15 of carboplatin, paclitaxel and Aloxi today.   Today she is accompanied by her daughter. Overall, she  tells me she has been feeling good. She continues having pain in her right leg from the hip down to the foot from the sciatic nerve pain; she continues doing her stretches. She had a painful bump on her right lower leg and had an US done for a possible  DVT. Her numbness in both feet is constant and she takes gabapentin TID. She gets nausea and diarrhea for 7 days after chemo, but then it resolves.   REVIEW OF SYSTEMS:  Review of Systems  Constitutional: Positive for fatigue (25%). Negative for appetite change.  Gastrointestinal: Positive for diarrhea (after chemo) and nausea (after chemo).  Musculoskeletal: Positive for arthralgias (3/10 R leg pain).  Neurological: Positive for dizziness (occasional), headaches (occasional) and numbness (& burning in feet).  Psychiatric/Behavioral: Positive for sleep disturbance.  All other systems reviewed and are negative.   PAST MEDICAL/SURGICAL HISTORY:  Past Medical History:  Diagnosis Date  . Cancer ([place])    Phreesia 09/05/2019  . Elevated blood pressure reading   . Endometrial cancer ([place])   . Hyperlipidemia   . Hypertension   . PMB (postmenopausal bleeding)   . Port-A-Cath in place 12/24/2018   Past Surgical History:  Procedure Laterality Date  . ABDOMINAL HYSTERECTOMY N/A    Phreesia 09/05/2019  . IR IMAGING GUIDED PORT INSERTION  12/28/2018  . IR RADIOLOGIST EVAL & MGMT  06/02/2018  . IR RADIOLOGIST EVAL & MGMT  06/16/2018  . NECK SURGERY  2000   spinal surgery , titanium rod in place   . ROBOTIC ASSISTED TOTAL HYSTERECTOMY WITH BILATERAL SALPINGO OOPHERECTOMY N/A 04/13/2018   Procedure: XI ROBOTIC ASSISTED TOTAL HYSTERECTOMY WITH BILATERAL SALPINGO OOPHORECTOMY;  Surgeon: Everitt Amber, MD;  Location: [facility];  Service: Gynecology;  Laterality: N/A;  . ROBOTIC PELVIC AND PARA-AORTIC LYMPH NODE DISSECTION N/A 04/13/2018   Procedure: XI ROBOTIC PELVIC LYMPHADECTOMY AND PARA-AORTIC LYMPH NODE DISSECTION;  Surgeon: Everitt Amber, MD;  Location: [facility];  Service: Gynecology;  Laterality: N/A;    SOCIAL HISTORY:  Social History   Socioeconomic History  . Marital status: Widowed    Spouse name: Not on file  . Number of children: 3  . Years of education: Not on file  . Highest education  level: Not on file  Occupational History  . 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 Smoking status: Never Smoker  . Smokeless tobacco: Never Used  Vaping Use  . Vaping Use: Never used  Substance and Sexual Activity  . Alcohol use: Yes    Alcohol/week: 4.0 standard drinks    Types: 4 Glasses of wine per week    Comment: weekends  . Drug use: Never  . Sexual activity: Not Currently  Other Topics Concern  . Not on file  Social History Narrative   Lives with daughter and granddaughter here in [place], moved 3 years ago      Cats: Zoe-80 years old, Eduard Clos, and Horald Pollen      Enjoys: walking when she can tolerate- spending time with granddaughter (her life)      Diet: eats all food groups   Caffeine: 1-2 cups at times, but does not tolerate it all the time   Water: 160 oz daily      Wears seat belt    Does not use phone while driving    Oceanographer at home   Fire extinguisher-no   No weapons   Social Determinants of Health   Financial Resource Strain: [place]   .  Difficulty of Paying Living Expenses: Not hard at all  Food Insecurity: No Food Insecurity  . Worried About Charity fundraiser in the Last Year: Never true  . Ran Out of Food in the Last Year: Never true  Transportation Needs: No Transportation Needs  . Lack of Transportation (Medical): No  . Lack of Transportation (Non-Medical): No  Physical Activity:   . Days of Exercise per Week: Not on file  . Minutes of Exercise per Session: Not on file  Stress: Stress Concern Present  . Feeling of Stress : Rather much  Social Connections: Socially Isolated  . Frequency of Communication with Friends and Family: More than three times a week  . Frequency of Social Gatherings with Friends and Family: More than three times a week  . Attends Religious Services: Never  . Active Member of Clubs or Organizations: No  . Attends Archivist Meetings: Never  . Marital Status: Widowed  Intimate  Partner Violence: Not At Risk  . Fear of Current or Ex-Partner: No  . Emotionally Abused: No  . Physically Abused: No  . Sexually Abused: No    FAMILY HISTORY:  Family History  Problem Relation Age of Onset  . Hypertension Father   . Heart disease Father   . Cancer Father        lung  . Hypertension Mother   . Cancer Sister        ovarian cancer    CURRENT MEDICATIONS:  Current Outpatient Medications  Medication Sig Dispense Refill  . amLODipine (NORVASC) 5 MG tablet Take 1 tablet (5 mg total) by mouth daily. 90 tablet 1  . atorvastatin (LIPITOR) 20 MG tablet Take 1 tablet (20 mg total) by mouth daily. 90 tablet 1  . CONSTULOSE 10 GM/15ML solution Take 30 g by mouth daily as needed.     . dicyclomine (BENTYL) 20 MG tablet Take 1 tablet (20 mg total) by mouth every 6 (six) hours. 30 tablet 1  . docusate sodium (COLACE) 100 MG capsule Take 200 mg by mouth 2 (two) times daily as needed for mild constipation (when taking pain medication).    Marland Kitchen EPINEPHrine 0.3 mg/0.3 mL IJ SOAJ injection Inject 0.3 mg into the muscle as needed for anaphylaxis. 1 each 0  . gabapentin (NEURONTIN) 300 MG capsule Take 1 capsule (300 mg total) by mouth 3 (three) times daily. 90 capsule 3  . ibuprofen (ADVIL) 200 MG tablet Take 200 mg by mouth every 6 (six) hours as needed.     . lidocaine-prilocaine (EMLA) cream Apply small amount to port a cath site and cover with plastic wrap one hour prior to chemotherapy appointments 30 g 3  . oxyCODONE-acetaminophen (PERCOCET) 5-325 MG tablet Take 1 tablet by mouth every 8 (eight) hours as needed (pain). 30 tablet 0  . PACLITAXEL IV Inject into the vein every 21 ( twenty-one) days.    . polyethylene glycol (MIRALAX / GLYCOLAX) 17 g packet Take 17 g by mouth 2 (two) times daily as needed.     . predniSONE (DELTASONE) 10 MG tablet Take 6 tablets day one, 5 tablets day two, 4 tablets day three, 3 tablets day four, 2 tablets day five, then 1 tablet day six 21 tablet 0  .  CARBOPLATIN IV Inject into the vein every 21 ( twenty-one) days. (Patient not taking: Reported on 10/26/2019)    . prochlorperazine (COMPAZINE) 10 MG tablet Take 1 tablet (10 mg total) by mouth every 6 (six) hours as needed (Nausea  or vomiting). (Patient not taking: Reported on 10/26/2019) 30 tablet 1   No current facility-administered medications for this visit.    ALLERGIES:  Allergies  Allergen Reactions  . Carboplatin Other (See Comments)    "body on fire and abdominal pain"  . Nickel Rash    itchy    PHYSICAL EXAM:  Performance status (ECOG): 1 - Symptomatic but completely ambulatory  Vitals:   10/26/19 0813  BP: 132/74  Pulse: 84  Resp: 18  Temp: (!) 96.8 F (36 C)  SpO2: 100%   Wt Readings from Last 3 Encounters:  10/26/19 234 lb 6.4 oz (106.3 kg)  10/20/19 234 lb 2.1 oz (106.2 kg)  10/05/19 233 lb (105.7 kg)   Physical Exam Vitals reviewed.  Constitutional:      Appearance: Normal appearance. She is obese.  Chest:     Comments: Port-a-Cath in R chest Skin:      Neurological:     General: No focal deficit present.     Mental Status: She is alert and oriented to person, place, and time.  Psychiatric:        Mood and Affect: Mood normal.        Behavior: Behavior normal.     LABORATORY DATA:  I have reviewed the labs as listed.  CBC Latest Ref Rng & Units 10/26/2019 10/05/2019 09/14/2019  WBC 4.0 - 10.5 K/uL 4.6 4.4 4.5  Hemoglobin 12.0 - 15.0 g/dL 10.2(L) 10.2(L) 10.6(L)  Hematocrit 36 - 46 % 31.0(L) 31.2(L) 32.1(L)  Platelets 150 - 400 K/uL 232 141(L) 182   CMP Latest Ref Rng & Units 10/26/2019 10/05/2019 09/14/2019  Glucose 70 - 99 mg/dL 163(H) 109(H) 164(H)  BUN 6 - 20 mg/dL _0 Creatinine 0.44 - 1.00 mg/dL 0.63 0.61 0.55  Sodium 135 - 145 mmol/L 137 141 137  Potassium 3.5 - 5.1 mmol/L 3.7 3.6 3.4(L)  Chloride 98 - 111 mmol/L 103 108 106  CO2 22 - 32 mmol/L _1 Calcium 8.9 - 10.3 mg/dL 9.4 8.8(L) 8.7(L)  Total Protein 6.5 - 8.1 g/dL 6.9  6.9 6.9  Total Bilirubin 0.3 - 1.2 mg/dL 0.5 0.5 0.3  Alkaline Phos 38 - 126 U/L 94 103 106  AST 15 - 41 U/L _2 ALT 0 - 44 U/L _3 DIAGNOSTIC IMAGING:  I have independently reviewed the scans and discussed with the patient. CT Abdomen Pelvis W Wo Contrast  Result Date: 10/24/2019 CLINICAL DATA:  Restaging endometrial cancer. Hysterectomy and bilateral salpingo oophorectomy with no dissection. Ongoing chemotherapy, most recently 3 weeks ago. EXAM: CT ABDOMEN AND PELVIS WITHOUT AND WITH CONTRAST TECHNIQUE: Multidetector CT imaging of the abdomen and pelvis was performed following the standard protocol before and following the bolus administration of intravenous contrast. CONTRAST:  111m OMNIPAQUE IOHEXOL 300 MG/ML  SOLN COMPARISON:  07/04/2019 FINDINGS: Lower chest: Small type 1 hiatal hernia. Hepatobiliary: Multiple gallstones including a stone or cluster of stones in the neck of the gallbladder. Common bile duct mildly dilated at 0.8 cm in diameter, stable. Otherwise unremarkable. Pancreas: Unremarkable Spleen: Unremarkable Adrenals/Urinary Tract: Unremarkable Stomach/Bowel: Unremarkable Vascular/Lymphatic: Aortoiliac atherosclerotic vascular disease. Reproductive: Uterus and ovaries are absent. Other: Along the inferior serosal margin of the sigmoid colon, a 1.4 by 0.9 by 1.0 cm soft tissue density is present, this previously measured 1.7 by 1.1 by 1.7 cm by my measurements. Two small soft tissue nodules in the mesenteric adipose tissue above the urinary bladder are present  as shown on images 66-67 of series 2, each measuring up to about 0.8 cm in long axis. One of these previously measured 1.5 cm in long axis and the other 1.3 cm in long axis. Reduce conspicuity of subtle left omental hazy nodularity for example on images 41-42 of series 2. No new soft tissue nodules are identified along the peritoneum or omentum. Each measuring up to about 0.8 cm in long axis diameter on images 63-66  Musculoskeletal: Lumbar spondylosis and degenerative disc disease causing multilevel impingement. Transitional S1 vertebra. IMPRESSION: 1. Substantial reduction in size of soft tissue nodules in the pelvis compatible with positive response to therapy. Faint stranding in the left omentum at site of prior nodularity. 2. Other imaging findings of potential clinical significance: Small type 1 hiatal hernia. Cholelithiasis. Lumbar spondylosis and degenerative disc disease causing multilevel impingement. Transitional S1 vertebra. 3. Aortic atherosclerosis. Aortic Atherosclerosis (ICD10-I70.0). Electronically Signed   By: Van Clines M.D.   On: 10/24/2019 15:19   US Venous Img Lower Unilateral Right  Result Date: 10/21/2019 CLINICAL DATA:  Pain, right lower leg palpable nodule EXAM: RIGHT LOWER EXTREMITY VENOUS DOPPLER ULTRASOUND TECHNIQUE: Gray-scale sonography with graded compression, as well as color Doppler and duplex ultrasound were performed to evaluate the lower extremity deep venous systems from the level of the common femoral vein and including the common femoral, femoral, profunda femoral, popliteal and calf veins including the posterior tibial, peroneal and gastrocnemius veins when visible. The superficial great saphenous vein was also interrogated. Spectral Doppler was utilized to evaluate flow at rest and with distal augmentation maneuvers in the common femoral, femoral and popliteal veins. COMPARISON:  None. FINDINGS: Contralateral Common Femoral Vein: Respiratory phasicity is normal and symmetric with the symptomatic side. No evidence of thrombus. Normal compressibility. Common Femoral Vein: No evidence of thrombus. Normal compressibility, respiratory phasicity and response to augmentation. Saphenofemoral Junction: No evidence of thrombus. Normal compressibility and flow on color Doppler imaging. Profunda Femoral Vein: No evidence of thrombus. Normal compressibility and flow on color Doppler  imaging. Femoral Vein: No evidence of thrombus. Normal compressibility, respiratory phasicity and response to augmentation. Popliteal Vein: No evidence of thrombus. Normal compressibility, respiratory phasicity and response to augmentation. Calf Veins: No evidence of thrombus. Normal compressibility and flow on color Doppler imaging. Other Findings: Right anterior shin abnormality correlates with small superficial subcutaneous vein. No significant varicosity. No thrombosis or thrombophlebitis IMPRESSION: No evidence of deep venous thrombosis. Electronically Signed   By: Jerilynn Mages.  Shick M.D.   On: 10/21/2019 13:49     ASSESSMENT:  1. Recurrent endometrial carcinosarcoma: -TAH, BSO, bilateral pelvic and para-aortic lymph node resections on 04/13/2018. -Pathology showed carcinosarcoma (malignant mixed mullerian tumor) is arising in an endometrial type polyp with no myometrial invasion identified. High-grade. 0/39 lymph nodes positive. PT1APN0, FIGO stage Ia. MMR normal. MSI-stable. -CTAP on 12/09/2018 for abdominal pain showed extensive peritoneal carcinomatosis and large soft tissue mass in the pelvis. -Biopsy of the omental mass on 12/28/2018 shows poorly differentiated carcinoma consistent with her prior malignancy. -Carboplatin and paclitaxel started on 12/29/2018. -CT scan on 05/02/2019 showed peritoneal implants in the low central small bowel mesentery and left pelvic sidewall have decreased in size measuring 1.5 x 1.9 cm and 1.7 x 2.2 cm. Soft tissue nodule in the left lateral omentum measures 1 x 1.7 cm with no evidence of metastatic disease. -Continuation of chemotherapy until complete response was recommended. -CT AP on 07/04/2019 showed continued positive response to therapy with scattered peritoneal metastasis in the pelvis and  left omentum decreased in size. No new metastatic disease. -CTAP on 10/24/2019 after 14 cycles showed substantial reduction in size of soft tissue nodules in the pelvis.   1.4 x 0.9 x 1 cm soft tissue density in the inferior serosal margin of the sigmoid colon, previously 1.7 x 1.1 x 1.7 cm.  2 small soft tissue nodules in the mesenteric adipose tissue above the urinary bladder measures 0.8 cm.  2. Breast abnormality: -Mammogram on 05/02/2019 shows BI-RADS Category 0. She reportedly took Covid shot 1 to 2 weeks prior to the mammogram. Further scans rescheduled on 06/21/2019.  3. Peripheral neuropathy: -She reported numbness and occasional pins-and-needles sensation in the bilateral toes, right more than left. This has started about a week ago.   PLAN:  1. Recurrent endometrial carcinosarcoma: -She had developed serious allergic reaction during cycle 14 when she was receiving carboplatin.  She was treated appropriately in the clinic and was transferred to the ER when she had developed hypotension.  She has improved after hydration. -We reviewed CT scan results which showed continuing response but remaining disease. -If we hold her chemotherapy, she will have relapse.  Substituting ifosfamide in place of carboplatin is a reasonable option. -I will also reach out to Dr. Denman George and seek her opinion. -We will hold her treatment today and see her back in 2 weeks.  2.Low back pain/right-sided abdominal pain: -Continue Percocet as needed.  3. Hypertension: -Continue Norvasc 5 mg daily.  4. Neuropathy: -She has lower back pain radiating into the right leg consistent with sciatica. -Continue gabapentin 300 mg 3 times daily.   Orders placed this encounter:  No orders of the defined types were placed in this encounter.    [name], MD [facility] [phone]   I, Milinda Antis, am acting as a scribe for Dr. Sanda Linger.  I, [name] MD, have reviewed the above documentation for accuracy and completeness, and I agree with the above.

## 2019-10-28 ENCOUNTER — Ambulatory Visit (HOSPITAL_COMMUNITY): Payer: Medicaid Other

## 2019-11-09 ENCOUNTER — Other Ambulatory Visit: Payer: Self-pay

## 2019-11-09 ENCOUNTER — Inpatient Hospital Stay (HOSPITAL_COMMUNITY): Payer: Medicaid Other

## 2019-11-09 ENCOUNTER — Inpatient Hospital Stay (HOSPITAL_BASED_OUTPATIENT_CLINIC_OR_DEPARTMENT_OTHER): Payer: Medicaid Other | Admitting: Hematology

## 2019-11-09 VITALS — BP 122/74 | HR 73 | Temp 97.9°F | Resp 18

## 2019-11-09 VITALS — BP 131/71 | HR 71 | Temp 97.0°F | Resp 18 | Wt 233.2 lb

## 2019-11-09 DIAGNOSIS — C541 Malignant neoplasm of endometrium: Secondary | ICD-10-CM

## 2019-11-09 DIAGNOSIS — Z95828 Presence of other vascular implants and grafts: Secondary | ICD-10-CM

## 2019-11-09 DIAGNOSIS — Z5111 Encounter for antineoplastic chemotherapy: Secondary | ICD-10-CM | POA: Diagnosis not present

## 2019-11-09 LAB — CBC WITH DIFFERENTIAL/PLATELET
Abs Immature Granulocytes: 0.01 10*3/uL (ref 0.00–0.07)
Basophils Absolute: 0 10*3/uL (ref 0.0–0.1)
Basophils Relative: 1 %
Eosinophils Absolute: 0.1 10*3/uL (ref 0.0–0.5)
Eosinophils Relative: 2 %
HCT: 33.8 % — ABNORMAL LOW (ref 36.0–46.0)
Hemoglobin: 11.1 g/dL — ABNORMAL LOW (ref 12.0–15.0)
Immature Granulocytes: 0 %
Lymphocytes Relative: 44 %
Lymphs Abs: 2.2 10*3/uL (ref 0.7–4.0)
MCH: 32.5 pg (ref 26.0–34.0)
MCHC: 32.8 g/dL (ref 30.0–36.0)
MCV: 98.8 fL (ref 80.0–100.0)
Monocytes Absolute: 0.4 10*3/uL (ref 0.1–1.0)
Monocytes Relative: 9 %
Neutro Abs: 2.1 10*3/uL (ref 1.7–7.7)
Neutrophils Relative %: 44 %
Platelets: 244 10*3/uL (ref 150–400)
RBC: 3.42 MIL/uL — ABNORMAL LOW (ref 3.87–5.11)
RDW: 13.5 % (ref 11.5–15.5)
WBC: 4.8 10*3/uL (ref 4.0–10.5)
nRBC: 0 % (ref 0.0–0.2)

## 2019-11-09 LAB — COMPREHENSIVE METABOLIC PANEL
ALT: 23 U/L (ref 0–44)
AST: 20 U/L (ref 15–41)
Albumin: 3.9 g/dL (ref 3.5–5.0)
Alkaline Phosphatase: 90 U/L (ref 38–126)
Anion gap: 11 (ref 5–15)
BUN: 13 mg/dL (ref 6–20)
CO2: 26 mmol/L (ref 22–32)
Calcium: 9.3 mg/dL (ref 8.9–10.3)
Chloride: 103 mmol/L (ref 98–111)
Creatinine, Ser: 0.53 mg/dL (ref 0.44–1.00)
GFR, Estimated: >60 mL/min (ref >60)
Glucose, Bld: 116 mg/dL — ABNORMAL HIGH (ref 70–99)
Potassium: 3.7 mmol/L (ref 3.5–5.1)
Sodium: 140 mmol/L (ref 135–145)
Total Bilirubin: 0.5 mg/dL (ref 0.3–1.2)
Total Protein: 7.1 g/dL (ref 6.5–8.1)

## 2019-11-09 MED ORDER — SODIUM CHLORIDE 0.9 % IV SOLN: Freq: Once | INTRAVENOUS | Status: AC

## 2019-11-09 MED ORDER — SODIUM CHLORIDE 0.9% FLUSH
10.0000 mL | INTRAVENOUS | Status: DC | PRN
  Administered 2019-11-09 (×2): 10 mL

## 2019-11-09 MED ORDER — SODIUM CHLORIDE 0.9 % IV SOLN
10.0000 mg | Freq: Once | INTRAVENOUS | Status: AC
  Administered 2019-11-09: 10 mg via INTRAVENOUS
  Filled 2019-11-09: qty 10

## 2019-11-09 MED ORDER — SODIUM CHLORIDE 0.9 % IV SOLN
175.0000 mg/m2 | Freq: Once | INTRAVENOUS | Status: AC
  Administered 2019-11-09: 360 mg via INTRAVENOUS
  Filled 2019-11-09: qty 60

## 2019-11-09 MED ORDER — HEPARIN SOD (PORK) LOCK FLUSH 100 UNIT/ML IV SOLN
500.0000 [IU] | Freq: Once | INTRAVENOUS | Status: AC | PRN
  Administered 2019-11-09: 500 [IU]

## 2019-11-09 MED ORDER — DIPHENHYDRAMINE HCL 50 MG/ML IJ SOLN
50.0000 mg | Freq: Once | INTRAMUSCULAR | Status: AC
  Administered 2019-11-09: 50 mg via INTRAVENOUS

## 2019-11-09 MED ORDER — FAMOTIDINE IN NACL 20-0.9 MG/50ML-% IV SOLN
20.0000 mg | Freq: Once | INTRAVENOUS | Status: AC
  Administered 2019-11-09: 20 mg via INTRAVENOUS

## 2019-11-09 MED ORDER — FAMOTIDINE IN NACL 20-0.9 MG/50ML-% IV SOLN
INTRAVENOUS | Status: AC
  Filled 2019-11-09: qty 50

## 2019-11-09 MED ORDER — DIPHENHYDRAMINE HCL 50 MG/ML IJ SOLN
INTRAMUSCULAR | Status: AC
  Filled 2019-11-09: qty 1

## 2019-11-09 MED ORDER — PALONOSETRON HCL INJECTION 0.25 MG/5ML
0.2500 mg | Freq: Once | INTRAVENOUS | Status: AC
  Administered 2019-11-09: 0.25 mg via INTRAVENOUS

## 2019-11-09 MED ORDER — SODIUM CHLORIDE 0.9 % IV SOLN
150.0000 mg | Freq: Once | INTRAVENOUS | Status: AC
  Administered 2019-11-09: 150 mg via INTRAVENOUS
  Filled 2019-11-09: qty 150

## 2019-11-09 NOTE — Progress Notes (Signed)
Patient was assessed by Dr. Katragadda and labs have been reviewed.  Patient is okay to proceed with treatment today. Primary RN and pharmacy aware.   

## 2019-11-09 NOTE — Progress Notes (Signed)
Pt here for D1C15 of taxol.  We have dropped [place] from treatment regimen.  We will monitor pt closely for taxol related treatment reactions today.  Labs reviewed with Dr Jill Poling for treatment today.   Tolerated treatment today without incidence.  Vital signs stable prior to discharge.  Discharged in stable condition ambulatory.

## 2019-11-09 NOTE — Patient Instructions (Signed)
[facility] at [facility] Discharge Instructions  You were seen today by Dr. Katragadda. He went over your recent results. You received your treatment today. Dr. Katragadda will see you back in 4 weeks for labs and follow up.   Thank you for choosing [facility] at [facility] to provide your oncology and hematology care.  To afford each patient quality time with our provider, please arrive at least 15 minutes before your scheduled appointment time.   If you have a lab appointment with the Cancer Center please come in thru the Main Entrance and check in at the main information desk  You need to re-schedule your appointment should you arrive 10 or more minutes late.  We strive to give you quality time with our providers, and arriving late affects you and other patients whose appointments are after yours.  Also, if you no show three or more times for appointments you may be dismissed from the clinic at the providers discretion.     Again, thank you for choosing [facility].  Our hope is that these requests will decrease the amount of time that you wait before being seen by our physicians.       _____________________________________________________________  Should you have questions after your visit to  Cancer Center, please contact our office at [phone] between the hours of 8:00 a.m. and 4:30 p.m.  Voicemails left after 4:00 p.m. will not be returned until the following business day.  For prescription refill requests, have your pharmacy contact our office and allow 72 hours.    Cancer Center Support Programs:   > Cancer Support Group  2nd Tuesday of the month 1pm-2pm, Journey Room    

## 2019-11-09 NOTE — Patient Instructions (Signed)
[facility] Discharge Instructions for Patients Receiving Chemotherapy  Today you received the following chemotherapy agents   To help prevent nausea and vomiting after your treatment, we encourage you to take your nausea medication   If you develop nausea and vomiting that is not controlled by your nausea medication, call the clinic.   BELOW ARE SYMPTOMS THAT SHOULD BE REPORTED IMMEDIATELY:  *FEVER GREATER THAN 100.5 F  *CHILLS WITH OR WITHOUT FEVER  NAUSEA AND VOMITING THAT IS NOT CONTROLLED WITH YOUR NAUSEA MEDICATION  *UNUSUAL SHORTNESS OF BREATH  *UNUSUAL BRUISING OR BLEEDING  TENDERNESS IN MOUTH AND THROAT WITH OR WITHOUT PRESENCE OF ULCERS  *URINARY PROBLEMS  *BOWEL PROBLEMS  UNUSUAL RASH Items with * indicate a potential emergency and should be followed up as soon as possible.  Feel free to call the clinic should you have any questions or concerns. The clinic phone number is [phone].  Please show the CHEMO ALERT CARD at check-in to the Emergency Department and triage nurse.   

## 2019-11-09 NOTE — Progress Notes (Signed)
[place], [place] [address]   CLINIC:  Medical Oncology/Hematology  PCP:  Perlie Mayo, NP [address] / [place] [address] [phone]   REASON FOR VISIT:  Follow-up for recurrent endometrial cancer  PRIOR THERAPY: TAH & BSO, bilateral pelvic and para-aortic lymph nodes dissections on 04/13/2018  NGS Results: Not done  CURRENT THERAPY: Carboplatin, paclitaxel & Aloxi every 3 weeks  BRIEF ONCOLOGIC HISTORY:  Oncology History  Endometrial cancer ([place])  03/31/2018 Initial Diagnosis   Endometrial cancer ([place])   12/29/2018 -  Chemotherapy   The patient had palonosetron (ALOXI) injection 0.25 mg, 0.25 mg, Intravenous,  Once, 14 of 16 cycles Administration: 0.25 mg (12/29/2018), 0.25 mg (01/19/2019), 0.25 mg (02/09/2019), 0.25 mg (03/02/2019), 0.25 mg (03/23/2019), 0.25 mg (04/13/2019), 0.25 mg (05/04/2019), 0.25 mg (05/25/2019), 0.25 mg (06/15/2019), 0.25 mg (07/06/2019), 0.25 mg (07/29/2019), 0.25 mg (08/24/2019), 0.25 mg (09/14/2019), 0.25 mg (10/05/2019) pegfilgrastim-cbqv (UDENYCA) injection 6 mg, 6 mg, Subcutaneous, Once, 14 of 16 cycles Administration: 6 mg (12/31/2018), 6 mg (01/20/2019), 6 mg (02/11/2019), 6 mg (03/04/2019), 6 mg (03/25/2019), 6 mg (04/15/2019), 6 mg (05/06/2019), 6 mg (05/27/2019), 6 mg (06/17/2019), 6 mg (07/08/2019), 6 mg (08/01/2019), 6 mg (08/26/2019), 6 mg (09/16/2019), 6 mg (10/07/2019) CARBOplatin (PARAPLATIN) 860 mg in sodium chloride 0.9 % 500 mL chemo infusion, 860 mg (100 % of original dose 862.8 mg), Intravenous,  Once, 14 of 16 cycles Dose modification:   (original dose 862.8 mg, Cycle 1),   (original dose 862.8 mg, Cycle 2),   (original dose 862.8 mg, Cycle 3),   (original dose 862.8 mg, Cycle 4),   (original dose 862.8 mg, Cycle 5),   (original dose 862.8 mg, Cycle 6),   (original dose 862.8 mg, Cycle 8),   (original dose 862.8 mg, Cycle 8) Administration: 860 mg (12/29/2018), 860 mg (01/19/2019), 860 mg (02/09/2019), 860 mg  (03/02/2019), 860 mg (03/23/2019), 860 mg (04/13/2019), 860 mg (05/04/2019), 860 mg (05/25/2019), 860 mg (06/15/2019), 860 mg (07/06/2019), 860 mg (07/29/2019), 860 mg (08/24/2019), 860 mg (09/14/2019), 860 mg (10/05/2019) fosaprepitant (EMEND) 150 mg in sodium chloride 0.9 % 145 mL IVPB, 150 mg, Intravenous,  Once, 14 of 16 cycles Administration: 150 mg (01/19/2019), 150 mg (02/09/2019), 150 mg (03/02/2019), 150 mg (03/23/2019), 150 mg (04/13/2019), 150 mg (05/04/2019), 150 mg (05/25/2019), 150 mg (06/15/2019), 150 mg (07/06/2019), 150 mg (07/29/2019), 150 mg (08/24/2019), 150 mg (09/14/2019), 150 mg (10/05/2019) PACLitaxel (TAXOL) 360 mg in sodium chloride 0.9 % 500 mL chemo infusion (> 40m/m2), 175 mg/m2 = 360 mg, Intravenous,  Once, 14 of 16 cycles Dose modification: 140 mg/m2 (80 % of original dose 175 mg/m2, Cycle 7, Reason: Other (see comments), Comment: hand pains), 140 mg/m2 (80 % of original dose 175 mg/m2, Cycle 8, Reason: Other (see comments), Comment: neoropathic pain) Administration: 360 mg (12/29/2018), 360 mg (01/19/2019), 360 mg (02/09/2019), 360 mg (03/02/2019), 360 mg (03/23/2019), 360 mg (04/13/2019), 288 mg (05/04/2019), 288 mg (05/25/2019), 288 mg (06/15/2019), 288 mg (07/06/2019), 288 mg (07/29/2019), 288 mg (08/24/2019), 288 mg (09/14/2019), 288 mg (10/05/2019)  for chemotherapy treatment.      CANCER STAGING: Cancer Staging No matching staging information was found for the patient.  INTERVAL HISTORY:  Jessica [name] a 59y.o. female, returns for routine follow-up and consideration for next cycle of chemotherapy. [name] last seen on 10/26/2019.  Due for cycle #15 of paclitaxel today.   Today she is accompanied by her daughter. Overall, she tells me she  has been feeling pretty well. She tolerated the previous treatment well and her energy levels are improving, though she admits being sedentary at home. She denies N/V, abdominal pain, dyspnea or SOB, and her numbness and tingling in her right foot is stable and  without pain or burning. Her sciatica is improving with the stretches that she does at home. Her appetite is excellent.  Her family is planning a family vacation from 11/11 to 11/14.  Overall, she feels ready for next cycle of chemo today.    REVIEW OF SYSTEMS:  Review of Systems  Constitutional: Positive for fatigue (50%). Negative for appetite change.  Respiratory: Positive for cough (sensation of needing to clear throat). Negative for shortness of breath.   Gastrointestinal: Negative for abdominal pain, nausea and vomiting.  Neurological: Positive for dizziness (occasional), headaches (occasional) and numbness (R foot > L foot).  All other systems reviewed and are negative.   PAST MEDICAL/SURGICAL HISTORY:  Past Medical History:  Diagnosis Date  . Cancer ([place])    Phreesia 09/05/2019  . Elevated blood pressure reading   . Endometrial cancer ([place])   . Hyperlipidemia   . Hypertension   . PMB (postmenopausal bleeding)   . Port-A-Cath in place 12/24/2018   Past Surgical History:  Procedure Laterality Date  . ABDOMINAL HYSTERECTOMY N/A    Phreesia 09/05/2019  . IR IMAGING GUIDED PORT INSERTION  12/28/2018  . IR RADIOLOGIST EVAL & MGMT  06/02/2018  . IR RADIOLOGIST EVAL & MGMT  06/16/2018  . NECK SURGERY  2000   spinal surgery , titanium rod in place   . ROBOTIC ASSISTED TOTAL HYSTERECTOMY WITH BILATERAL SALPINGO OOPHERECTOMY N/A 04/13/2018   Procedure: XI ROBOTIC ASSISTED TOTAL HYSTERECTOMY WITH BILATERAL SALPINGO OOPHORECTOMY;  Surgeon: Everitt Amber, MD;  Location: [facility];  Service: Gynecology;  Laterality: N/A;  . ROBOTIC PELVIC AND PARA-AORTIC LYMPH NODE DISSECTION N/A 04/13/2018   Procedure: XI ROBOTIC PELVIC LYMPHADECTOMY AND PARA-AORTIC LYMPH NODE DISSECTION;  Surgeon: Everitt Amber, MD;  Location: [facility];  Service: Gynecology;  Laterality: N/A;    SOCIAL HISTORY:  Social History   Socioeconomic History  . Marital status: Widowed    Spouse name: Not on file  . Number of  children: 3  . Years of education: Not on file  . Highest education level: Not on file  Occupational History  . 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 Smoking status: Never Smoker  . Smokeless tobacco: Never Used  Vaping Use  . Vaping Use: Never used  Substance and Sexual Activity  . Alcohol use: Yes    Alcohol/week: 4.0 standard drinks    Types: 4 Glasses of wine per week    Comment: weekends  . Drug use: Never  . Sexual activity: Not Currently  Other Topics Concern  . Not on file  Social History Narrative   Lives with daughter and granddaughter here in [place], moved 3 years ago      Cats: Zoe-9 years old, Eduard Clos, and Horald Pollen      Enjoys: walking when she can tolerate- spending time with granddaughter (her life)      Diet: eats all food groups   Caffeine: 1-2 cups at times, but does not tolerate it all the time   Water: 160 oz daily      Wears seat belt    Does not use phone while driving    Smoke detectors at home   Fire extinguisher-no   No weapons  Social Determinants of Health   Financial Resource Strain: Low Risk   . Difficulty of Paying Living Expenses: Not hard at all  Food Insecurity: No Food Insecurity  . Worried About Charity fundraiser in the Last Year: Never true  . Ran Out of Food in the Last Year: Never true  Transportation Needs: No Transportation Needs  . Lack of Transportation (Medical): No  . Lack of Transportation (Non-Medical): No  Physical Activity:   . Days of Exercise per Week: Not on file  . Minutes of Exercise per Session: Not on file  Stress: Stress Concern Present  . Feeling of Stress : Rather much  Social Connections: Socially Isolated  . Frequency of Communication with Friends and Family: More than three times a week  . Frequency of Social Gatherings with Friends and Family: More than three times a week  . Attends Religious Services: Never  . Active Member of Clubs or Organizations: No  . Attends Theatre manager Meetings: Never  . Marital Status: Widowed  Intimate Partner Violence: Not At Risk  . Fear of Current or Ex-Partner: No  . Emotionally Abused: No  . Physically Abused: No  . Sexually Abused: No    FAMILY HISTORY:  Family History  Problem Relation Age of Onset  . Hypertension Father   . Heart disease Father   . Cancer Father        lung  . Hypertension Mother   . Cancer Sister        ovarian cancer    CURRENT MEDICATIONS:  Current Outpatient Medications  Medication Sig Dispense Refill  . amLODipine (NORVASC) 5 MG tablet Take 1 tablet (5 mg total) by mouth daily. 90 tablet 1  . atorvastatin (LIPITOR) 20 MG tablet Take 1 tablet (20 mg total) by mouth daily. 90 tablet 1  . CARBOPLATIN IV Inject into the vein every 21 ( twenty-one) days.     . CONSTULOSE 10 GM/15ML solution Take 30 g by mouth daily as needed.     . dicyclomine (BENTYL) 20 MG tablet Take 1 tablet (20 mg total) by mouth every 6 (six) hours. 30 tablet 1  . docusate sodium (COLACE) 100 MG capsule Take 200 mg by mouth 2 (two) times daily as needed for mild constipation (when taking pain medication).    Marland Kitchen EPINEPHrine 0.3 mg/0.3 mL IJ SOAJ injection Inject 0.3 mg into the muscle as needed for anaphylaxis. 1 each 0  . gabapentin (NEURONTIN) 300 MG capsule Take 1 capsule (300 mg total) by mouth 3 (three) times daily. 90 capsule 3  . ibuprofen (ADVIL) 200 MG tablet Take 200 mg by mouth every 6 (six) hours as needed.     Marland Kitchen oxyCODONE-acetaminophen (PERCOCET) 5-325 MG tablet Take 1 tablet by mouth every 8 (eight) hours as needed (pain). 30 tablet 0  . PACLITAXEL IV Inject into the vein every 21 ( twenty-one) days.    . polyethylene glycol (MIRALAX / GLYCOLAX) 17 g packet Take 17 g by mouth 2 (two) times daily as needed.     . predniSONE (DELTASONE) 10 MG tablet Take 6 tablets day one, 5 tablets day two, 4 tablets day three, 3 tablets day four, 2 tablets day five, then 1 tablet day six 21 tablet 0  .  lidocaine-prilocaine (EMLA) cream Apply small amount to port a cath site and cover with plastic wrap one hour prior to chemotherapy appointments (Patient not taking: Reported on 11/09/2019) 30 g 3  . prochlorperazine (COMPAZINE) 10 MG tablet  Take 1 tablet (10 mg total) by mouth every 6 (six) hours as needed (Nausea or vomiting). (Patient not taking: Reported on 11/09/2019) 30 tablet 1   No current facility-administered medications for this visit.    ALLERGIES:  Allergies  Allergen Reactions  . Carboplatin Other (See Comments)    "body on fire and abdominal pain"  . Nickel Rash    itchy    PHYSICAL EXAM:  Performance status (ECOG): 1 - Symptomatic but completely ambulatory  Vitals:   11/09/19 0843  BP: 131/71  Pulse: 71  Resp: 18  Temp: (!) 97 F (36.1 C)  SpO2: 97%   Wt Readings from Last 3 Encounters:  11/09/19 233 lb 3.2 oz (105.8 kg)  10/26/19 234 lb 6.4 oz (106.3 kg)  10/20/19 234 lb 2.1 oz (106.2 kg)   Physical Exam Vitals reviewed.  Constitutional:      Appearance: Normal appearance. She is obese.  Cardiovascular:     Rate and Rhythm: Normal rate and regular rhythm.     Pulses: Normal pulses.     Heart sounds: Normal heart sounds.  Pulmonary:     Effort: Pulmonary effort is normal.     Breath sounds: Normal breath sounds.  Chest:     Comments: Port-a-Cath in R chest Musculoskeletal:     Right lower leg: No edema.     Left lower leg: No edema.  Neurological:     General: No focal deficit present.     Mental Status: She is alert and oriented to person, place, and time.  Psychiatric:        Mood and Affect: Mood normal.        Behavior: Behavior normal.     LABORATORY DATA:  I have reviewed the labs as listed.  CBC Latest Ref Rng & Units 11/09/2019 10/26/2019 10/05/2019  WBC 4.0 - 10.5 K/uL 4.8 4.6 4.4  Hemoglobin 12.0 - 15.0 g/dL 11.1(L) 10.2(L) 10.2(L)  Hematocrit 36 - 46 % 33.8(L) 31.0(L) 31.2(L)  Platelets 150 - 400 K/uL 244 232 141(L)   CMP  Latest Ref Rng & Units 11/09/2019 10/26/2019 10/05/2019  Glucose 70 - 99 mg/dL 116(H) 163(H) 109(H)  BUN 6 - 20 mg/dL _0 Creatinine 0.44 - 1.00 mg/dL 0.53 0.63 0.61  Sodium 135 - 145 mmol/L 140 137 141  Potassium 3.5 - 5.1 mmol/L 3.7 3.7 3.6  Chloride 98 - 111 mmol/L 103 103 108  CO2 22 - 32 mmol/L _1 Calcium 8.9 - 10.3 mg/dL 9.3 9.4 8.8(L)  Total Protein 6.5 - 8.1 g/dL 7.1 6.9 6.9  Total Bilirubin 0.3 - 1.2 mg/dL 0.5 0.5 0.5  Alkaline Phos 38 - 126 U/L 90 94 103  AST 15 - 41 U/L _2 ALT 0 - 44 U/L _3 DIAGNOSTIC IMAGING:  I have independently reviewed the scans and discussed with the patient. CT Abdomen Pelvis W Wo Contrast  Result Date: 10/24/2019 CLINICAL DATA:  Restaging endometrial cancer. Hysterectomy and bilateral salpingo oophorectomy with no dissection. Ongoing chemotherapy, most recently 3 weeks ago. EXAM: CT ABDOMEN AND PELVIS WITHOUT AND WITH CONTRAST TECHNIQUE: Multidetector CT imaging of the abdomen and pelvis was performed following the standard protocol before and following the bolus administration of intravenous contrast. CONTRAST:  15m OMNIPAQUE IOHEXOL 300 MG/ML  SOLN COMPARISON:  07/04/2019 FINDINGS: Lower chest: Small type 1 hiatal hernia. Hepatobiliary: Multiple gallstones including a stone or cluster of stones in the neck of the gallbladder. Common bile  duct mildly dilated at 0.8 cm in diameter, stable. Otherwise unremarkable. Pancreas: Unremarkable Spleen: Unremarkable Adrenals/Urinary Tract: Unremarkable Stomach/Bowel: Unremarkable Vascular/Lymphatic: Aortoiliac atherosclerotic vascular disease. Reproductive: Uterus and ovaries are absent. Other: Along the inferior serosal margin of the sigmoid colon, a 1.4 by 0.9 by 1.0 cm soft tissue density is present, this previously measured 1.7 by 1.1 by 1.7 cm by my measurements. Two small soft tissue nodules in the mesenteric adipose tissue above the urinary bladder are present as shown on images 66-67  of series 2, each measuring up to about 0.8 cm in long axis. One of these previously measured 1.5 cm in long axis and the other 1.3 cm in long axis. Reduce conspicuity of subtle left omental hazy nodularity for example on images 41-42 of series 2. No new soft tissue nodules are identified along the peritoneum or omentum. Each measuring up to about 0.8 cm in long axis diameter on images 63-66 Musculoskeletal: Lumbar spondylosis and degenerative disc disease causing multilevel impingement. Transitional S1 vertebra. IMPRESSION: 1. Substantial reduction in size of soft tissue nodules in the pelvis compatible with positive response to therapy. Faint stranding in the left omentum at site of prior nodularity. 2. Other imaging findings of potential clinical significance: Small type 1 hiatal hernia. Cholelithiasis. Lumbar spondylosis and degenerative disc disease causing multilevel impingement. Transitional S1 vertebra. 3. Aortic atherosclerosis. Aortic Atherosclerosis (ICD10-I70.0). Electronically Signed   By: Van Clines M.D.   On: 10/24/2019 15:19   US Venous Img Lower Unilateral Right  Result Date: 10/21/2019 CLINICAL DATA:  Pain, right lower leg palpable nodule EXAM: RIGHT LOWER EXTREMITY VENOUS DOPPLER ULTRASOUND TECHNIQUE: Gray-scale sonography with graded compression, as well as color Doppler and duplex ultrasound were performed to evaluate the lower extremity deep venous systems from the level of the common femoral vein and including the common femoral, femoral, profunda femoral, popliteal and calf veins including the posterior tibial, peroneal and gastrocnemius veins when visible. The superficial great saphenous vein was also interrogated. Spectral Doppler was utilized to evaluate flow at rest and with distal augmentation maneuvers in the common femoral, femoral and popliteal veins. COMPARISON:  None. FINDINGS: Contralateral Common Femoral Vein: Respiratory phasicity is normal and symmetric with the  symptomatic side. No evidence of thrombus. Normal compressibility. Common Femoral Vein: No evidence of thrombus. Normal compressibility, respiratory phasicity and response to augmentation. Saphenofemoral Junction: No evidence of thrombus. Normal compressibility and flow on color Doppler imaging. Profunda Femoral Vein: No evidence of thrombus. Normal compressibility and flow on color Doppler imaging. Femoral Vein: No evidence of thrombus. Normal compressibility, respiratory phasicity and response to augmentation. Popliteal Vein: No evidence of thrombus. Normal compressibility, respiratory phasicity and response to augmentation. Calf Veins: No evidence of thrombus. Normal compressibility and flow on color Doppler imaging. Other Findings: Right anterior shin abnormality correlates with small superficial subcutaneous vein. No significant varicosity. No thrombosis or thrombophlebitis IMPRESSION: No evidence of deep venous thrombosis. Electronically Signed   By: Jerilynn Mages.  Shick M.D.   On: 10/21/2019 13:49     ASSESSMENT:  1. Recurrent endometrial carcinosarcoma: -TAH, BSO, bilateral pelvic and para-aortic lymph node resections on 04/13/2018. -Pathology showed carcinosarcoma (malignant mixed mullerian tumor) is arising in an endometrial type polyp with no myometrial invasion identified. High-grade. 0/39 lymph nodes positive. PT1APN0, FIGO stage Ia. MMR normal. MSI-stable. -CTAP on 12/09/2018 for abdominal pain showed extensive peritoneal carcinomatosis and large soft tissue mass in the pelvis. -Biopsy of the omental mass on 12/28/2018 shows poorly differentiated carcinoma consistent with her prior malignancy. -Carboplatin and  paclitaxel started on 12/29/2018. -CT scan on 05/02/2019 showed peritoneal implants in the low central small bowel mesentery and left pelvic sidewall have decreased in size measuring 1.5 x 1.9 cm and 1.7 x 2.2 cm. Soft tissue nodule in the left lateral omentum measures 1 x 1.7 cm with no  evidence of metastatic disease. -Continuation of chemotherapy until complete response was recommended. -CT AP on 07/04/2019 showed continued positive response to therapy with scattered peritoneal metastasis in the pelvis and left omentum decreased in size. No new metastatic disease. -CTAP on 10/24/2019 after 14 cycles showed substantial reduction in size of soft tissue nodules in the pelvis.  1.4 x 0.9 x 1 cm soft tissue density in the inferior serosal margin of the sigmoid colon, previously 1.7 x 1.1 x 1.7 cm.  2 small soft tissue nodules in the mesenteric adipose tissue above the urinary bladder measures 0.8 cm.  2. Breast abnormality: -Mammogram on 05/02/2019 shows BI-RADS Category 0. She reportedly took Covid shot 1 to 2 weeks prior to the mammogram. Further scans rescheduled on 06/21/2019.  3. Peripheral neuropathy: -She reported numbness and occasional pins-and-needles sensation in the bilateral toes, right more than left. This has started about a week ago.   PLAN:  1. Recurrent endometrial carcinosarcoma: -She had developed serious allergic reaction during cycle 14 when she was receiving carboplatin. -I have talked to her about various options including adding ifosfamide to Taxol.  I have also reached out to Dr. Denman George who has given various options of ifosfamide, Doxil and lenvatinib with pembrolizumab.  In the lenvatinib and pembrolizumab trial, carcinosarcoma's were not included.  Hence I did not recommend that regimen. -We will continue with single agent paclitaxel at this time.  If there is any signs of progression, will consider adding ifosfamide. -Reviewed her chemistries which were within normal limits.  CBC shows normal white count and platelets.  She will proceed with full dose paclitaxel at _0 mg per metered square. -RTC in 3 weeks for follow-up.  2.Low back pain/right-sided abdominal pain: -Stretching exercises are helping.  Continue Percocet as needed.  3.  Hypertension: -Continue Norvasc 5 mg daily.  4. Neuropathy: -She is doing some stretching exercises which has improved her sciatica of the right side. -She will continue gabapentin 300 mg 3 times a day.   Orders placed this encounter:  No orders of the defined types were placed in this encounter.    [name], MD [place] [phone]   I, Milinda Antis, am acting as a scribe for Dr. Sanda Linger.  I, [name] MD, have reviewed the above documentation for accuracy and completeness, and I agree with the above.

## 2019-11-09 NOTE — Patient Instructions (Signed)
[facility] Discharge Instructions  Labs drawn from portacath today   Thank you for choosing [facility] at [facility] to provide your oncology and hematology care.  To afford each patient quality time with our provider, please arrive at least 15 minutes before your scheduled appointment time.   If you have a lab appointment with the Cancer Center please come in thru the Main Entrance and check in at the main information desk.  You need to re-schedule your appointment should you arrive 10 or more minutes late.  We strive to give you quality time with our providers, and arriving late affects you and other patients whose appointments are after yours.  Also, if you no show three or more times for appointments you may be dismissed from the clinic at the providers discretion.     Again, thank you for choosing [facility].  Our hope is that these requests will decrease the amount of time that you wait before being seen by our physicians.       _____________________________________________________________  Should you have questions after your visit to [facility], please contact our office at [phone] and follow the prompts.  Our office hours are 8:00 a.m. and 4:30 p.m. Monday - Friday.  Please note that voicemails left after 4:00 p.m. may not be returned until the following business day.  We are closed weekends and major holidays.  You do have access to a nurse 24-7, just call the main number to the clinic [phone] and do not press any options, hold on the line and a nurse will answer the phone.    For prescription refill requests, have your pharmacy contact our office and allow 72 hours.    Due to Covid, you will need to wear a mask upon entering the hospital. If you do not have a mask, a mask will be given to you at the Main Entrance upon arrival. For doctor visits, patients may have 1 support person age 18  or older with them. For treatment visits, patients can not have anyone with them due to social distancing guidelines and our immunocompromised population.     

## 2019-11-11 ENCOUNTER — Ambulatory Visit (HOSPITAL_COMMUNITY): Payer: Medicaid Other

## 2019-12-07 ENCOUNTER — Inpatient Hospital Stay (HOSPITAL_COMMUNITY): Payer: Medicaid Other | Attending: Hematology

## 2019-12-07 ENCOUNTER — Inpatient Hospital Stay (HOSPITAL_COMMUNITY): Payer: Medicaid Other

## 2019-12-07 ENCOUNTER — Inpatient Hospital Stay (HOSPITAL_COMMUNITY): Payer: Medicaid Other | Attending: Hematology | Admitting: Hematology

## 2019-12-07 ENCOUNTER — Other Ambulatory Visit: Payer: Self-pay

## 2019-12-07 VITALS — BP 133/73 | HR 74 | Temp 96.9°F | Resp 18 | Wt 238.4 lb

## 2019-12-07 VITALS — BP 140/79 | HR 80 | Temp 97.0°F | Resp 18

## 2019-12-07 DIAGNOSIS — Z8249 Family history of ischemic heart disease and other diseases of the circulatory system: Secondary | ICD-10-CM | POA: Diagnosis not present

## 2019-12-07 DIAGNOSIS — Z452 Encounter for adjustment and management of vascular access device: Secondary | ICD-10-CM | POA: Insufficient documentation

## 2019-12-07 DIAGNOSIS — Z8041 Family history of malignant neoplasm of ovary: Secondary | ICD-10-CM | POA: Diagnosis not present

## 2019-12-07 DIAGNOSIS — Z79899 Other long term (current) drug therapy: Secondary | ICD-10-CM | POA: Insufficient documentation

## 2019-12-07 DIAGNOSIS — Z90710 Acquired absence of both cervix and uterus: Secondary | ICD-10-CM | POA: Insufficient documentation

## 2019-12-07 DIAGNOSIS — Z7952 Long term (current) use of systemic steroids: Secondary | ICD-10-CM | POA: Diagnosis not present

## 2019-12-07 DIAGNOSIS — I10 Essential (primary) hypertension: Secondary | ICD-10-CM | POA: Diagnosis not present

## 2019-12-07 DIAGNOSIS — E785 Hyperlipidemia, unspecified: Secondary | ICD-10-CM | POA: Diagnosis not present

## 2019-12-07 DIAGNOSIS — Z95828 Presence of other vascular implants and grafts: Secondary | ICD-10-CM

## 2019-12-07 DIAGNOSIS — Z5111 Encounter for antineoplastic chemotherapy: Secondary | ICD-10-CM | POA: Diagnosis not present

## 2019-12-07 DIAGNOSIS — Z9079 Acquired absence of other genital organ(s): Secondary | ICD-10-CM | POA: Diagnosis not present

## 2019-12-07 DIAGNOSIS — C541 Malignant neoplasm of endometrium: Secondary | ICD-10-CM

## 2019-12-07 DIAGNOSIS — C786 Secondary malignant neoplasm of retroperitoneum and peritoneum: Secondary | ICD-10-CM | POA: Diagnosis present

## 2019-12-07 DIAGNOSIS — Z90722 Acquired absence of ovaries, bilateral: Secondary | ICD-10-CM | POA: Diagnosis not present

## 2019-12-07 DIAGNOSIS — Z9221 Personal history of antineoplastic chemotherapy: Secondary | ICD-10-CM | POA: Insufficient documentation

## 2019-12-07 DIAGNOSIS — G629 Polyneuropathy, unspecified: Secondary | ICD-10-CM | POA: Diagnosis not present

## 2019-12-07 DIAGNOSIS — Z1231 Encounter for screening mammogram for malignant neoplasm of breast: Secondary | ICD-10-CM

## 2019-12-07 DIAGNOSIS — G620 Drug-induced polyneuropathy: Secondary | ICD-10-CM | POA: Insufficient documentation

## 2019-12-07 DIAGNOSIS — Z801 Family history of malignant neoplasm of trachea, bronchus and lung: Secondary | ICD-10-CM | POA: Insufficient documentation

## 2019-12-07 LAB — CBC WITH DIFFERENTIAL/PLATELET
Abs Immature Granulocytes: 0.02 10*3/uL (ref 0.00–0.07)
Basophils Absolute: 0 10*3/uL (ref 0.0–0.1)
Basophils Relative: 1 %
Eosinophils Absolute: 0.1 10*3/uL (ref 0.0–0.5)
Eosinophils Relative: 1 %
HCT: 35 % — ABNORMAL LOW (ref 36.0–46.0)
Hemoglobin: 11.3 g/dL — ABNORMAL LOW (ref 12.0–15.0)
Immature Granulocytes: 0 %
Lymphocytes Relative: 39 %
Lymphs Abs: 2.1 10*3/uL (ref 0.7–4.0)
MCH: 31.7 pg (ref 26.0–34.0)
MCHC: 32.3 g/dL (ref 30.0–36.0)
MCV: 98 fL (ref 80.0–100.0)
Monocytes Absolute: 0.5 10*3/uL (ref 0.1–1.0)
Monocytes Relative: 8 %
Neutro Abs: 2.7 10*3/uL (ref 1.7–7.7)
Neutrophils Relative %: 51 %
Platelets: 223 10*3/uL (ref 150–400)
RBC: 3.57 MIL/uL — ABNORMAL LOW (ref 3.87–5.11)
RDW: 13.6 % (ref 11.5–15.5)
WBC: 5.4 10*3/uL (ref 4.0–10.5)
nRBC: 0 % (ref 0.0–0.2)

## 2019-12-07 LAB — COMPREHENSIVE METABOLIC PANEL
ALT: 21 U/L (ref 0–44)
AST: 17 U/L (ref 15–41)
Albumin: 3.8 g/dL (ref 3.5–5.0)
Alkaline Phosphatase: 84 U/L (ref 38–126)
Anion gap: 9 (ref 5–15)
BUN: 13 mg/dL (ref 6–20)
CO2: 27 mmol/L (ref 22–32)
Calcium: 9 mg/dL (ref 8.9–10.3)
Chloride: 105 mmol/L (ref 98–111)
Creatinine, Ser: 0.6 mg/dL (ref 0.44–1.00)
GFR, Estimated: >60 mL/min (ref >60)
Glucose, Bld: 107 mg/dL — ABNORMAL HIGH (ref 70–99)
Potassium: 3.7 mmol/L (ref 3.5–5.1)
Sodium: 141 mmol/L (ref 135–145)
Total Bilirubin: 0.4 mg/dL (ref 0.3–1.2)
Total Protein: 6.9 g/dL (ref 6.5–8.1)

## 2019-12-07 MED ORDER — PALONOSETRON HCL INJECTION 0.25 MG/5ML
0.2500 mg | Freq: Once | INTRAVENOUS | Status: AC
  Administered 2019-12-07: 0.25 mg via INTRAVENOUS
  Filled 2019-12-07: qty 5

## 2019-12-07 MED ORDER — DIPHENHYDRAMINE HCL 50 MG/ML IJ SOLN
50.0000 mg | Freq: Once | INTRAMUSCULAR | Status: AC
  Administered 2019-12-07: 50 mg via INTRAVENOUS

## 2019-12-07 MED ORDER — SODIUM CHLORIDE 0.9% FLUSH
10.0000 mL | INTRAVENOUS | Status: DC | PRN
  Administered 2019-12-07 (×2): 10 mL

## 2019-12-07 MED ORDER — FAMOTIDINE IN NACL 20-0.9 MG/50ML-% IV SOLN
20.0000 mg | Freq: Once | INTRAVENOUS | Status: AC
  Administered 2019-12-07: 20 mg via INTRAVENOUS

## 2019-12-07 MED ORDER — SODIUM CHLORIDE 0.9 % IV SOLN
175.0000 mg/m2 | Freq: Once | INTRAVENOUS | Status: AC
  Administered 2019-12-07: 360 mg via INTRAVENOUS
  Filled 2019-12-07: qty 60

## 2019-12-07 MED ORDER — FAMOTIDINE IN NACL 20-0.9 MG/50ML-% IV SOLN
INTRAVENOUS | Status: AC
  Filled 2019-12-07: qty 50

## 2019-12-07 MED ORDER — HEPARIN SOD (PORK) LOCK FLUSH 100 UNIT/ML IV SOLN
500.0000 [IU] | Freq: Once | INTRAVENOUS | Status: AC | PRN
  Administered 2019-12-07: 500 [IU]

## 2019-12-07 MED ORDER — PALONOSETRON HCL INJECTION 0.25 MG/5ML
INTRAVENOUS | Status: AC
  Filled 2019-12-07: qty 5

## 2019-12-07 MED ORDER — SODIUM CHLORIDE 0.9 % IV SOLN
10.0000 mg | Freq: Once | INTRAVENOUS | Status: AC
  Administered 2019-12-07: 10 mg via INTRAVENOUS
  Filled 2019-12-07: qty 10

## 2019-12-07 MED ORDER — DIPHENHYDRAMINE HCL 50 MG/ML IJ SOLN
INTRAMUSCULAR | Status: AC
  Filled 2019-12-07: qty 1

## 2019-12-07 MED ORDER — SODIUM CHLORIDE 0.9 % IV SOLN: Freq: Once | INTRAVENOUS | Status: AC

## 2019-12-07 NOTE — Progress Notes (Signed)
Patient presents today for treatment.  Vital signs are within parameters.  No new complaints.  Treatment given today per MD orders.  Tolerated infusion without adverse affects.  Vital signs stable.  No complaints at this time.  Discharge from clinic ambulatory in stable condition.  Alert and oriented X 3.  Follow up with [facility] as scheduled.

## 2019-12-07 NOTE — Patient Instructions (Signed)
[facility] Discharge Instructions for Patients Receiving Chemotherapy  Today you received the following chemotherapy agents   To help prevent nausea and vomiting after your treatment, we encourage you to take your nausea medication   If you develop nausea and vomiting that is not controlled by your nausea medication, call the clinic.   BELOW ARE SYMPTOMS THAT SHOULD BE REPORTED IMMEDIATELY:  *FEVER GREATER THAN 100.5 F  *CHILLS WITH OR WITHOUT FEVER  NAUSEA AND VOMITING THAT IS NOT CONTROLLED WITH YOUR NAUSEA MEDICATION  *UNUSUAL SHORTNESS OF BREATH  *UNUSUAL BRUISING OR BLEEDING  TENDERNESS IN MOUTH AND THROAT WITH OR WITHOUT PRESENCE OF ULCERS  *URINARY PROBLEMS  *BOWEL PROBLEMS  UNUSUAL RASH Items with * indicate a potential emergency and should be followed up as soon as possible.  Feel free to call the clinic should you have any questions or concerns. The clinic phone number is [phone].  Please show the CHEMO ALERT CARD at check-in to the Emergency Department and triage nurse.   

## 2019-12-07 NOTE — Progress Notes (Signed)
Patients port flushed without difficulty.  Good blood return noted with no bruising or swelling noted at site.  Transparent dressing applied and patient left accessed for chemotherapy treatment. °

## 2019-12-07 NOTE — Progress Notes (Signed)
Jessica [name], [place] [address]   CLINIC:  Medical Oncology/Hematology  PCP:  Perlie Mayo, NP [address] / [place] [address] [phone]   REASON FOR VISIT:  Follow-up for recurrent endometrial cancer  PRIOR THERAPY: TAH & BSO, bilateral pelvic and para-aortic lymph nodes dissections on 04/13/2018  NGS Results: Not done  CURRENT THERAPY: Paclitaxel & Aloxi every 3 weeks  BRIEF ONCOLOGIC HISTORY:  Oncology History  Endometrial cancer ([place])  03/31/2018 Initial Diagnosis   Endometrial cancer ([place])   12/29/2018 -  Chemotherapy   The patient had palonosetron (ALOXI) injection 0.25 mg, 0.25 mg, Intravenous,  Once, 15 of 18 cycles Administration: 0.25 mg (12/29/2018), 0.25 mg (01/19/2019), 0.25 mg (02/09/2019), 0.25 mg (03/02/2019), 0.25 mg (03/23/2019), 0.25 mg (04/13/2019), 0.25 mg (05/04/2019), 0.25 mg (05/25/2019), 0.25 mg (06/15/2019), 0.25 mg (07/06/2019), 0.25 mg (07/29/2019), 0.25 mg (08/24/2019), 0.25 mg (09/14/2019), 0.25 mg (10/05/2019), 0.25 mg (11/09/2019) pegfilgrastim-cbqv (UDENYCA) injection 6 mg, 6 mg, Subcutaneous, Once, 14 of 17 cycles Administration: 6 mg (12/31/2018), 6 mg (01/20/2019), 6 mg (02/11/2019), 6 mg (03/04/2019), 6 mg (03/25/2019), 6 mg (04/15/2019), 6 mg (05/06/2019), 6 mg (05/27/2019), 6 mg (06/17/2019), 6 mg (07/08/2019), 6 mg (08/01/2019), 6 mg (08/26/2019), 6 mg (09/16/2019), 6 mg (10/07/2019) CARBOplatin (PARAPLATIN) 860 mg in sodium chloride 0.9 % 500 mL chemo infusion, 860 mg (100 % of original dose 862.8 mg), Intravenous,  Once, 14 of 14 cycles Dose modification:   (original dose 862.8 mg, Cycle 1),   (original dose 862.8 mg, Cycle 2),   (original dose 862.8 mg, Cycle 3),   (original dose 862.8 mg, Cycle 4),   (original dose 862.8 mg, Cycle 5),   (original dose 862.8 mg, Cycle 6),   (original dose 862.8 mg, Cycle 8),   (original dose 862.8 mg, Cycle 8) Administration: 860 mg (12/29/2018), 860 mg (01/19/2019), 860 mg (02/09/2019), 860 mg  (03/02/2019), 860 mg (03/23/2019), 860 mg (04/13/2019), 860 mg (05/04/2019), 860 mg (05/25/2019), 860 mg (06/15/2019), 860 mg (07/06/2019), 860 mg (07/29/2019), 860 mg (08/24/2019), 860 mg (09/14/2019), 860 mg (10/05/2019) fosaprepitant (EMEND) 150 mg in sodium chloride 0.9 % 145 mL IVPB, 150 mg, Intravenous,  Once, 15 of 15 cycles Administration: 150 mg (01/19/2019), 150 mg (02/09/2019), 150 mg (03/02/2019), 150 mg (03/23/2019), 150 mg (04/13/2019), 150 mg (05/04/2019), 150 mg (05/25/2019), 150 mg (06/15/2019), 150 mg (07/06/2019), 150 mg (07/29/2019), 150 mg (08/24/2019), 150 mg (09/14/2019), 150 mg (10/05/2019), 150 mg (11/09/2019) PACLitaxel (TAXOL) 360 mg in sodium chloride 0.9 % 500 mL chemo infusion (> 91m/m2), 175 mg/m2 = 360 mg, Intravenous,  Once, 15 of 18 cycles Dose modification: 140 mg/m2 (80 % of original dose 175 mg/m2, Cycle 7, Reason: Other (see comments), Comment: hand pains), 140 mg/m2 (80 % of original dose 175 mg/m2, Cycle 8, Reason: Other (see comments), Comment: neoropathic pain), 175 mg/m2 (original dose 175 mg/m2, Cycle 15, Reason: Provider Judgment), 175 mg/m2 (original dose 175 mg/m2, Cycle 16, Reason: Provider Judgment) Administration: 360 mg (12/29/2018), 360 mg (01/19/2019), 360 mg (02/09/2019), 360 mg (03/02/2019), 360 mg (03/23/2019), 360 mg (04/13/2019), 288 mg (05/04/2019), 288 mg (05/25/2019), 288 mg (06/15/2019), 288 mg (07/06/2019), 288 mg (07/29/2019), 288 mg (08/24/2019), 288 mg (09/14/2019), 288 mg (10/05/2019), 360 mg (11/09/2019)  for chemotherapy treatment.      CANCER STAGING: Cancer Staging No matching staging information was found for the patient.  INTERVAL HISTORY:  Jessica [name] a 59y.o. female, returns for routine follow-up and consideration for next cycle  of chemotherapy. Rondalyn was last seen on 11/09/2019.  Due for cycle #16 of paclitaxel and Aloxi today.   Overall, she tells me she has been feeling pretty well. She tolerated the previous treatment well and denies having abdominal pain  or N/V. She continues having numbness and tingling without pain in his right foot and toes; it is stable. She denies having any pain in her legs.  Overall, she feels ready for next cycle of chemo today.    REVIEW OF SYSTEMS:  Review of Systems  Constitutional: Positive for fatigue (75%). Negative for appetite change.  Gastrointestinal: Negative for nausea and vomiting.  Musculoskeletal: Negative for arthralgias.  Neurological: Positive for dizziness, headaches (occasional) and numbness (R foot and toes; stable).  Psychiatric/Behavioral: Positive for depression (occasional) and sleep disturbance. The patient is nervous/anxious (occasional).   All other systems reviewed and are negative.   PAST MEDICAL/SURGICAL HISTORY:  Past Medical History:  Diagnosis Date  . Cancer ([place])    Phreesia 09/05/2019  . Elevated blood pressure reading   . Endometrial cancer ([place])   . Hyperlipidemia   . Hypertension   . PMB (postmenopausal bleeding)   . Port-A-Cath in place 12/24/2018   Past Surgical History:  Procedure Laterality Date  . ABDOMINAL HYSTERECTOMY N/A    Phreesia 09/05/2019  . IR IMAGING GUIDED PORT INSERTION  12/28/2018  . IR RADIOLOGIST EVAL & MGMT  06/02/2018  . IR RADIOLOGIST EVAL & MGMT  06/16/2018  . NECK SURGERY  2000   spinal surgery , titanium rod in place   . ROBOTIC ASSISTED TOTAL HYSTERECTOMY WITH BILATERAL SALPINGO OOPHERECTOMY N/A 04/13/2018   Procedure: XI ROBOTIC ASSISTED TOTAL HYSTERECTOMY WITH BILATERAL SALPINGO OOPHORECTOMY;  Surgeon: Everitt Amber, MD;  Location: [facility];  Service: Gynecology;  Laterality: N/A;  . ROBOTIC PELVIC AND PARA-AORTIC LYMPH NODE DISSECTION N/A 04/13/2018   Procedure: XI ROBOTIC PELVIC LYMPHADECTOMY AND PARA-AORTIC LYMPH NODE DISSECTION;  Surgeon: Everitt Amber, MD;  Location: [facility];  Service: Gynecology;  Laterality: N/A;    SOCIAL HISTORY:  Social History   Socioeconomic History  . Marital status: Widowed    Spouse name: Not on file  .  Number of children: 3  . Years of education: Not on file  . Highest education level: Not on file  Occupational History  . 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 Smoking status: Never Smoker  . Smokeless tobacco: Never Used  Vaping Use  . Vaping Use: Never used  Substance and Sexual Activity  . Alcohol use: Yes    Alcohol/week: 4.0 standard drinks    Types: 4 Glasses of wine per week    Comment: weekends  . Drug use: Never  . Sexual activity: Not Currently  Other Topics Concern  . Not on file  Social History Narrative   Lives with daughter and granddaughter here in [place], moved 3 years ago      Cats: Zoe-50 years old, Eduard Clos, and Horald Pollen      Enjoys: walking when she can tolerate- spending time with granddaughter (her life)      Diet: eats all food groups   Caffeine: 1-2 cups at times, but does not tolerate it all the time   Water: 160 oz daily      Wears seat belt    Does not use phone while driving    Oceanographer at home   Fire extinguisher-no   No weapons   Social Determinants of Radio broadcast assistant  Strain: Low Risk   . Difficulty of Paying Living Expenses: Not hard at all  Food Insecurity: No Food Insecurity  . Worried About Charity fundraiser in the Last Year: Never true  . Ran Out of Food in the Last Year: Never true  Transportation Needs: No Transportation Needs  . Lack of Transportation (Medical): No  . Lack of Transportation (Non-Medical): No  Physical Activity:   . Days of Exercise per Week: Not on file  . Minutes of Exercise per Session: Not on file  Stress: Stress Concern Present  . Feeling of Stress : Rather much  Social Connections: Socially Isolated  . Frequency of Communication with Friends and Family: More than three times a week  . Frequency of Social Gatherings with Friends and Family: More than three times a week  . Attends Religious Services: Never  . Active Member of Clubs or Organizations: No  .  Attends Archivist Meetings: Never  . Marital Status: Widowed  Intimate Partner Violence: Not At Risk  . Fear of Current or Ex-Partner: No  . Emotionally Abused: No  . Physically Abused: No  . Sexually Abused: No    FAMILY HISTORY:  Family History  Problem Relation Age of Onset  . Hypertension Father   . Heart disease Father   . Cancer Father        lung  . Hypertension Mother   . Cancer Sister        ovarian cancer    CURRENT MEDICATIONS:  Current Outpatient Medications  Medication Sig Dispense Refill  . amLODipine (NORVASC) 5 MG tablet Take 1 tablet (5 mg total) by mouth daily. 90 tablet 1  . atorvastatin (LIPITOR) 20 MG tablet Take 1 tablet (20 mg total) by mouth daily. 90 tablet 1  . CONSTULOSE 10 GM/15ML solution Take 30 g by mouth daily as needed.     . dicyclomine (BENTYL) 20 MG tablet Take 1 tablet (20 mg total) by mouth every 6 (six) hours. 30 tablet 1  . docusate sodium (COLACE) 100 MG capsule Take 200 mg by mouth 2 (two) times daily as needed for mild constipation (when taking pain medication).    Marland Kitchen EPINEPHrine 0.3 mg/0.3 mL IJ SOAJ injection Inject 0.3 mg into the muscle as needed for anaphylaxis. 1 each 0  . gabapentin (NEURONTIN) 300 MG capsule Take 1 capsule (300 mg total) by mouth 3 (three) times daily. 90 capsule 3  . ibuprofen (ADVIL) 200 MG tablet Take 200 mg by mouth every 6 (six) hours as needed.     . lidocaine-prilocaine (EMLA) cream Apply small amount to port a cath site and cover with plastic wrap one hour prior to chemotherapy appointments 30 g 3  . oxyCODONE-acetaminophen (PERCOCET) 5-325 MG tablet Take 1 tablet by mouth every 8 (eight) hours as needed (pain). 30 tablet 0  . PACLITAXEL IV Inject into the vein every 21 ( twenty-one) days.    . polyethylene glycol (MIRALAX / GLYCOLAX) 17 g packet Take 17 g by mouth 2 (two) times daily as needed.     . predniSONE (DELTASONE) 10 MG tablet Take 6 tablets day one, 5 tablets day two, 4 tablets day  three, 3 tablets day four, 2 tablets day five, then 1 tablet day six 21 tablet 0  . prochlorperazine (COMPAZINE) 10 MG tablet Take 1 tablet (10 mg total) by mouth every 6 (six) hours as needed (Nausea or vomiting). (Patient not taking: Reported on 12/07/2019) 30 tablet 1   No current  facility-administered medications for this visit.    ALLERGIES:  Allergies  Allergen Reactions  . Carboplatin Other (See Comments)    "body on fire and abdominal pain"  . Nickel Rash    itchy    PHYSICAL EXAM:  Performance status (ECOG): 1 - Symptomatic but completely ambulatory  Vitals:   12/07/19 0853  BP: 133/73  Pulse: 74  Resp: 18  Temp: (!) 96.9 F (36.1 C)  SpO2: 96%   Wt Readings from Last 3 Encounters:  12/07/19 238 lb 6.4 oz (108.1 kg)  11/09/19 233 lb 3.2 oz (105.8 kg)  10/26/19 234 lb 6.4 oz (106.3 kg)   Physical Exam Vitals reviewed.  Constitutional:      Appearance: Normal appearance. She is obese.  Cardiovascular:     Rate and Rhythm: Normal rate and regular rhythm.     Pulses: Normal pulses.     Heart sounds: Normal heart sounds.  Pulmonary:     Effort: Pulmonary effort is normal.     Breath sounds: Normal breath sounds.  Chest:     Comments: Port-a-Cath in R chest Abdominal:     Palpations: Abdomen is soft. There is no mass.     Tenderness: There is no abdominal tenderness.     Hernia: No hernia is present.  Musculoskeletal:     Right lower leg: No edema.     Left lower leg: No edema.  Neurological:     General: No focal deficit present.     Mental Status: She is alert and oriented to person, place, and time.  Psychiatric:        Mood and Affect: Mood normal.        Behavior: Behavior normal.     LABORATORY DATA:  I have reviewed the labs as listed.  CBC Latest Ref Rng & Units 12/07/2019 11/09/2019 10/26/2019  WBC 4.0 - 10.5 K/uL 5.4 4.8 4.6  Hemoglobin 12.0 - 15.0 g/dL 11.3(L) 11.1(L) 10.2(L)  Hematocrit 36 - 46 % 35.0(L) 33.8(L) 31.0(L)  Platelets 150 -  400 K/uL 223 244 232   CMP Latest Ref Rng & Units 12/07/2019 11/09/2019 10/26/2019  Glucose 70 - 99 mg/dL 107(H) 116(H) 163(H)  BUN 6 - 20 mg/dL _0 Creatinine 0.44 - 1.00 mg/dL 0.60 0.53 0.63  Sodium 135 - 145 mmol/L 141 140 137  Potassium 3.5 - 5.1 mmol/L 3.7 3.7 3.7  Chloride 98 - 111 mmol/L 105 103 103  CO2 22 - 32 mmol/L _1 Calcium 8.9 - 10.3 mg/dL 9.0 9.3 9.4  Total Protein 6.5 - 8.1 g/dL 6.9 7.1 6.9  Total Bilirubin 0.3 - 1.2 mg/dL 0.4 0.5 0.5  Alkaline Phos 38 - 126 U/L 84 90 94  AST 15 - 41 U/L _2 ALT 0 - 44 U/L _3 No results found for: CA125  DIAGNOSTIC IMAGING:  I have independently reviewed the scans and discussed with the patient. No results found.   ASSESSMENT:  1. Recurrent endometrial carcinosarcoma: -TAH, BSO, bilateral pelvic and para-aortic lymph node resections on 04/13/2018. -Pathology showed carcinosarcoma (malignant mixed mullerian tumor) is arising in an endometrial type polyp with no myometrial invasion identified. High-grade. 0/39 lymph nodes positive. PT1APN0, FIGO stage Ia. MMR normal. MSI-stable. -CTAP on 12/09/2018 for abdominal pain showed extensive peritoneal carcinomatosis and large soft tissue mass in the pelvis. -Biopsy of the omental mass on 12/28/2018 shows poorly differentiated carcinoma consistent with her prior malignancy. -Carboplatin and paclitaxel started on 12/29/2018. -CT scan  on 05/02/2019 showed peritoneal implants in the low central small bowel mesentery and left pelvic sidewall have decreased in size measuring 1.5 x 1.9 cm and 1.7 x 2.2 cm. Soft tissue nodule in the left lateral omentum measures 1 x 1.7 cm with no evidence of metastatic disease. -Continuation of chemotherapy until complete response was recommended. -CT AP on 07/04/2019 showed continued positive response to therapy with scattered peritoneal metastasis in the pelvis and left omentum decreased in size. No new metastatic disease. -CTAP on  10/24/2019 after 14 cycles showed substantial reduction in size of soft tissue nodules in the pelvis. 1.4 x 0.9 x 1 cm soft tissue density in the inferior serosal margin of the sigmoid colon, previously 1.7 x 1.1 x 1.7 cm. 2 small soft tissue nodules in the mesenteric adipose tissue above the urinary bladder measures 0.8 cm. -She has developed serious allergic reaction during cycle 14 with carboplatin. -I have discussed with Dr. Denman George.  Other options include adding ifosfamide to Taxol or Doxil and combination of lenvatinib with pembrolizumab.  In the lenvatinib pembrolizumab trial carcinosarcomas were included. -Hence we started her on single agent paclitaxel.  2. Breast abnormality: -Mammogram on 05/02/2019 shows BI-RADS Category 0. She reportedly took Covid shot 1 to 2 weeks prior to the mammogram. Further scans rescheduled on 06/21/2019.  3. Peripheral neuropathy: -She reported numbness and occasional pins-and-needles sensation in the bilateral toes, right more than left. This has started about a week ago.   PLAN:  1. Recurrent endometrial carcinosarcoma: -She has tolerated single agent paclitaxel without any allergic reactions. -Her CA-125 is normal.  LFTs and CBC are acceptable. -She will proceed with her next treatment today. -She will come back in 3 weeks for her next paclitaxel dose.  Plan to repeat CT CAP after next cycle, that is after cycle 3 of single agent paclitaxel.  2.Low back pain/right-sided abdominal pain: -Lower back pain from sciatica has improved with stretching exercises.  Denies any abdominal pains at this time.  She has Percocet if needed.  3. Hypertension: -Continue Norvasc 5 mg daily.  4. Neuropathy: -Continue gabapentin 300 mg 3 times a day.  This is predominantly in the feet.  No worsening since I increased the paclitaxel dose 3 weeks ago.  5.  Abnormal mammogram: -Mammogram on 06/28/2019 showed right breast abnormality. -We will order right  breast diagnostic mammogram in the first week of December.   Orders placed this encounter:  Orders Placed This Encounter  Procedures  . CT Abdomen Pelvis W Contrast     [name], MD [place] [phone]   I, Milinda Antis, am acting as a scribe for Dr. Sanda Linger.  I, [name] MD, have reviewed the above documentation for accuracy and completeness, and I agree with the above.

## 2019-12-07 NOTE — Patient Instructions (Addendum)
[place] at [facility] Discharge Instructions  You were seen today by Dr. Delton Coombes. He went over your recent results. You received your treatment today. You will be scheduled for a mammogram of your right breast. Dr. Delton Coombes will see you back in 3 weeks for labs and follow up.   Thank you for choosing [place] at [facility] to provide your oncology and hematology care.  To afford each patient quality time with our provider, please arrive at least 15 minutes before your scheduled appointment time.   If you have a lab appointment with the [facility] please come in thru the Main Entrance and check in at the main information desk  You need to re-schedule your appointment should you arrive 10 or more minutes late.  We strive to give you quality time with our providers, and arriving late affects you and other patients whose appointments are after yours.  Also, if you no show three or more times for appointments you may be dismissed from the clinic at the providers discretion.     Again, thank you for choosing [facility].  Our hope is that these requests will decrease the amount of time that you wait before being seen by our physicians.       _____________________________________________________________  Should you have questions after your visit to [facility], please contact our office at [phone] between the hours of 8:00 a.m. and 4:30 p.m.  Voicemails left after 4:00 p.m. will not be returned until the following business day.  For prescription refill requests, have your pharmacy contact our office and allow 72 hours.    Cancer Center Support Programs:   > Cancer Support Group  2nd Tuesday of the month 1pm-2pm, Journey Room

## 2019-12-07 NOTE — Progress Notes (Signed)
Labs within parameters for treatment today. Patient presents for follow up visit with Dr. Delton Coombes and treatment.   Labs reviewed by MD. Message received from [place] RN/ Dr. Delton Coombes to proceed with treatment today.

## 2019-12-07 NOTE — Progress Notes (Signed)
Patient was assessed by Dr. Katragadda and labs have been reviewed.  Patient is okay to proceed with treatment today. Primary RN and pharmacy aware.   

## 2019-12-08 ENCOUNTER — Encounter: Payer: Self-pay | Admitting: Family Medicine

## 2019-12-08 ENCOUNTER — Telehealth (INDEPENDENT_AMBULATORY_CARE_PROVIDER_SITE_OTHER): Payer: Medicaid Other | Admitting: Family Medicine

## 2019-12-08 VITALS — Ht 64.0 in | Wt 235.0 lb

## 2019-12-08 DIAGNOSIS — I10 Essential (primary) hypertension: Secondary | ICD-10-CM | POA: Diagnosis not present

## 2019-12-08 DIAGNOSIS — F321 Major depressive disorder, single episode, moderate: Secondary | ICD-10-CM

## 2019-12-08 DIAGNOSIS — C541 Malignant neoplasm of endometrium: Secondary | ICD-10-CM | POA: Diagnosis not present

## 2019-12-08 LAB — CA 125: Cancer Antigen (CA) 125: 5.2 U/mL (ref 0.0–38.1)

## 2019-12-08 NOTE — Assessment & Plan Note (Signed)
Reports overall doing well with this no issues or concerns.  Doing well on Norvasc.  Encouraged DASH diet and exercise as she tolerates

## 2019-12-08 NOTE — Patient Instructions (Signed)
  HAPPY FALL!  I appreciate the opportunity to provide you with care for your health and wellness  Follow up: Aug for CPE- fasting appt   No labs or referrals today  Have a wonderful Holiday Season!  Please continue to practice social distancing to keep you, your family, and our community safe.  If you must go out, please wear a mask and practice good handwashing.  It was a pleasure to see you and I look forward to continuing to work together on your health and well-being. Please do not hesitate to call the office if you need care or have questions about your care.  Have a wonderful day and week. With Gratitude, Cherly Beach, DNP, AGNP-BC

## 2019-12-08 NOTE — Assessment & Plan Note (Signed)
Denies having any SI or HI.  PHQ has improved since last seen.  Declines wanting any therapy at this time.  Reports that she was not ready to talk to anybody when I referred her back in August.  She is enjoying spending time with her family and that is where her focus is right now.

## 2019-12-08 NOTE — Assessment & Plan Note (Addendum)
Currently is undergoing endometrial cancer treatments.  Had an reaction to one of the medications that had to be stopped.  Hopefully the adjustment she will still have success with treatment.  We will have updated scans in the near future.  [facility].

## 2019-12-08 NOTE — Progress Notes (Addendum)
Virtual Visit via Telephone Note   This visit type was conducted due to national recommendations for restrictions regarding the COVID-19 Pandemic (e.g. social distancing) in an effort to limit this patient's exposure and mitigate transmission in our community.  Due to her co-morbid illnesses, this patient is at least at moderate risk for complications without adequate follow up.  This format is felt to be most appropriate for this patient at this time.  The patient did not have access to video technology/had technical difficulties with video requiring transitioning to audio format only (telephone).  All issues noted in this document were discussed and addressed.  No physical exam could be performed with this format.    Evaluation Performed:  Follow-up visit  Date:  12/08/2019   ID:  Jessica [name], DOB September 18, 1960, MRN [id number]  Participating in call: Patient, nurse, provider  Patient Location: Home Provider Location: Office/Clinic   Location of Patient: Home Location of Provider: Telehealth Consent was obtain for visit to be over via telehealth. I verified that I am speaking with the correct person using two identifiers.  PCP:  Perlie Mayo, NP   Chief Complaint:   Chronic conditions  History of Present Illness:    Jessica [name] is a 59 y.o. female with history as stated below.  Is receiving ongoing treatment for endometrial cancer.  Recently had a allergic reaction to one medication so they had to do an adjustment.  Though she was told they are unsure if the adjustment will do a full treatment of her cancer.  She will finish up after 1 more session and then have some repeat scans soon after that.  She [place]  That was very pleasant and enjoyable for her.  She denies having any ongoing issues or concerns to discuss today.  Reports taking her medication as directed and without any issue.  Depression is  been an issue since diagnosis.  I referred her to a therapist but she just was not ready to talk to anybody at that time.  She reports that she still has trouble but she still not ready to talking by at this time and will reach out if she feels that that changes.  The patient does not have symptoms concerning for COVID-19 infection (fever, chills, cough, or new shortness of breath).   Past Medical, Surgical, Social History, Allergies, and Medications have been Reviewed.  Past Medical History:  Diagnosis Date  . Cancer ([place])    Phreesia 09/05/2019  . Elevated blood pressure reading   . Endometrial cancer ([place])   . Hyperlipidemia   . Hypertension   . PMB (postmenopausal bleeding)   . Port-A-Cath in place 12/24/2018   Past Surgical History:  Procedure Laterality Date  . ABDOMINAL HYSTERECTOMY N/A    Phreesia 09/05/2019  . IR IMAGING GUIDED PORT INSERTION  12/28/2018  . IR RADIOLOGIST EVAL & MGMT  06/02/2018  . IR RADIOLOGIST EVAL & MGMT  06/16/2018  . NECK SURGERY  2000   spinal surgery , titanium rod in place   . ROBOTIC ASSISTED TOTAL HYSTERECTOMY WITH BILATERAL SALPINGO OOPHERECTOMY N/A 04/13/2018   Procedure: XI ROBOTIC ASSISTED TOTAL HYSTERECTOMY WITH BILATERAL SALPINGO OOPHORECTOMY;  Surgeon: Everitt Amber, MD;  Location: [facility];  Service: Gynecology;  Laterality: N/A;  . ROBOTIC PELVIC AND PARA-AORTIC LYMPH NODE DISSECTION N/A 04/13/2018   Procedure: XI ROBOTIC PELVIC LYMPHADECTOMY AND PARA-AORTIC LYMPH NODE DISSECTION;  Surgeon: Everitt Amber, MD;  Location: [facility];  Service: Gynecology;  Laterality: N/A;     Current Meds  Medication Sig  . amLODipine (NORVASC) 5 MG tablet Take 1 tablet (5 mg total) by mouth daily.  Marland Kitchen atorvastatin (LIPITOR) 20 MG tablet Take 1 tablet (20 mg total) by mouth daily.  . CONSTULOSE 10 GM/15ML solution Take 30 g by mouth daily as needed.   . dicyclomine (BENTYL) 20 MG tablet Take 1 tablet (20 mg total) by mouth every 6 (six) hours.  . docusate sodium  (COLACE) 100 MG capsule Take 200 mg by mouth 2 (two) times daily as needed for mild constipation (when taking pain medication).  Marland Kitchen EPINEPHrine 0.3 mg/0.3 mL IJ SOAJ injection Inject 0.3 mg into the muscle as needed for anaphylaxis.  Marland Kitchen gabapentin (NEURONTIN) 300 MG capsule Take 1 capsule (300 mg total) by mouth 3 (three) times daily.  Marland Kitchen ibuprofen (ADVIL) 200 MG tablet Take 200 mg by mouth every 6 (six) hours as needed.   . lidocaine-prilocaine (EMLA) cream Apply small amount to port a cath site and cover with plastic wrap one hour prior to chemotherapy appointments  . oxyCODONE-acetaminophen (PERCOCET) 5-325 MG tablet Take 1 tablet by mouth every 8 (eight) hours as needed (pain).  Marland Kitchen PACLITAXEL IV Inject into the vein every 21 ( twenty-one) days.  . polyethylene glycol (MIRALAX / GLYCOLAX) 17 g packet Take 17 g by mouth 2 (two) times daily as needed.   . predniSONE (DELTASONE) 10 MG tablet Take 6 tablets day one, 5 tablets day two, 4 tablets day three, 3 tablets day four, 2 tablets day five, then 1 tablet day six  . prochlorperazine (COMPAZINE) 10 MG tablet Take 1 tablet (10 mg total) by mouth every 6 (six) hours as needed (Nausea or vomiting).     Allergies:   Carboplatin and Nickel   ROS:   Please see the history of present illness.    All other systems reviewed and are negative.   Labs/Other Tests and Data Reviewed:    Recent Labs: 02/09/2019: Magnesium 1.9 12/07/2019: ALT 21; BUN 13; Creatinine, Ser 0.60; Hemoglobin 11.3; Platelets 223; Potassium 3.7; Sodium 141   Recent Lipid Panel Lab Results  Component Value Date/Time   CHOL 202 (H) 03/04/2019 08:47 AM   CHOL 260 (H) 04/26/2018 10:27 AM   TRIG 62 03/04/2019 08:47 AM   HDL 75 03/04/2019 08:47 AM   HDL 90 04/26/2018 10:27 AM   CHOLHDL 2.7 03/04/2019 08:47 AM   LDLCALC 115 (H) 03/04/2019 08:47 AM   LDLCALC 155 (H) 04/26/2018 10:27 AM    Wt Readings from Last 3 Encounters:  12/08/19 235 lb (106.6 kg)  12/07/19 238 lb 6.4 oz  (108.1 kg)  11/09/19 233 lb 3.2 oz (105.8 kg)     Objective:    Vital Signs:  Ht 5\' 4"  (1.626 m)   Wt 235 lb (106.6 kg)   BMI 40.34 kg/m    VITAL SIGNS:  reviewed GEN:  no acute distress RESPIRATORY:  no shortness of breath in conversation  PSYCH:  normal affect   Depression screen [facility] 2/9 12/08/2019 09/07/2019 09/07/2019 04/26/2018 03/29/2018  Decreased Interest 0 0 0 1 1  Down, Depressed, Hopeless 1 1 1 1 2   PHQ - 2 Score 1 1 1 2 3   Altered sleeping - 3 - 3 3  Tired, decreased energy - 3 - 2 1  Change in appetite - 3 - 1 3  Feeling bad or failure about yourself  -  3 - 1 3  Trouble concentrating - 3 - 2 3  Moving slowly or fidgety/restless - 0 - 0 2  Suicidal thoughts - 0 - 0 0  PHQ-9 Score - 16 - 11 18     ASSESSMENT & PLAN:   1. Depression, major, single episode, moderate ([place])  2. Endometrial cancer ([place])  3. Essential hypertension  Time:   Today, I have spent 7 minutes with the patient with telehealth technology discussing the above problems.     Medication Adjustments/Labs and Tests Ordered: Current medicines are reviewed at length with the patient today.  Concerns regarding medicines are outlined above.   Tests Ordered: No orders of the defined types were placed in this encounter.   Medication Changes: No orders of the defined types were placed in this encounter.    Note: This dictation was prepared with Dragon dictation along with smaller phrase technology. Similar sounding words can be transcribed inadequately or may not be corrected upon review. Any transcriptional errors that result from this process are unintentional.      Disposition:  Follow up 6 months  Signed, Perlie Mayo, NP  12/08/2019 9:42 AM     [place]

## 2019-12-09 ENCOUNTER — Ambulatory Visit (HOSPITAL_COMMUNITY): Payer: Medicaid Other

## 2019-12-09 ENCOUNTER — Other Ambulatory Visit: Payer: Self-pay

## 2019-12-17 ENCOUNTER — Other Ambulatory Visit (HOSPITAL_COMMUNITY): Payer: Self-pay | Admitting: Hematology

## 2019-12-19 ENCOUNTER — Other Ambulatory Visit (HOSPITAL_COMMUNITY): Payer: Self-pay | Admitting: Hematology

## 2019-12-19 DIAGNOSIS — R928 Other abnormal and inconclusive findings on diagnostic imaging of breast: Secondary | ICD-10-CM

## 2019-12-26 ENCOUNTER — Other Ambulatory Visit (HOSPITAL_COMMUNITY): Payer: Self-pay | Admitting: Hematology

## 2019-12-26 DIAGNOSIS — C541 Malignant neoplasm of endometrium: Secondary | ICD-10-CM

## 2019-12-26 DIAGNOSIS — Z95828 Presence of other vascular implants and grafts: Secondary | ICD-10-CM

## 2019-12-27 NOTE — Progress Notes (Signed)
Jessica [name], [place] [id number]   CLINIC:  Medical Oncology/Hematology  PCP:  Perlie Mayo, NP [id number] / [place] [address] [phone]   REASON FOR VISIT:  Follow-up for recurrent endometrial cancer  PRIOR THERAPY: TAH & BSO, bilateral pelvic and para-aortic lymph nodes dissections on 04/13/2018  NGS Results: Not done  CURRENT THERAPY: Paclitaxel & Aloxi every 3 weeks  BRIEF ONCOLOGIC HISTORY:  Oncology History  Endometrial cancer ([place])  03/31/2018 Initial Diagnosis   Endometrial cancer ([place])   12/29/2018 -  Chemotherapy   The patient had palonosetron (ALOXI) injection 0.25 mg, 0.25 mg, Intravenous,  Once, 16 of 18 cycles Administration: 0.25 mg (12/29/2018), 0.25 mg (01/19/2019), 0.25 mg (02/09/2019), 0.25 mg (03/02/2019), 0.25 mg (03/23/2019), 0.25 mg (04/13/2019), 0.25 mg (05/04/2019), 0.25 mg (05/25/2019), 0.25 mg (06/15/2019), 0.25 mg (07/06/2019), 0.25 mg (07/29/2019), 0.25 mg (08/24/2019), 0.25 mg (09/14/2019), 0.25 mg (10/05/2019), 0.25 mg (11/09/2019), 0.25 mg (12/07/2019) pegfilgrastim-cbqv (UDENYCA) injection 6 mg, 6 mg, Subcutaneous, Once, 15 of 17 cycles Administration: 6 mg (12/31/2018), 6 mg (01/20/2019), 6 mg (02/11/2019), 6 mg (03/04/2019), 6 mg (03/25/2019), 6 mg (04/15/2019), 6 mg (05/06/2019), 6 mg (05/27/2019), 6 mg (06/17/2019), 6 mg (07/08/2019), 6 mg (08/01/2019), 6 mg (08/26/2019), 6 mg (09/16/2019), 6 mg (10/07/2019) CARBOplatin (PARAPLATIN) 860 mg in sodium chloride 0.9 % 500 mL chemo infusion, 860 mg (100 % of original dose 862.8 mg), Intravenous,  Once, 14 of 14 cycles Dose modification:   (original dose 862.8 mg, Cycle 1),   (original dose 862.8 mg, Cycle 2),   (original dose 862.8 mg, Cycle 3),   (original dose 862.8 mg, Cycle 4),   (original dose 862.8 mg, Cycle 5),   (original dose 862.8 mg, Cycle 6),   (original dose 862.8 mg, Cycle 8),   (original dose 862.8 mg, Cycle 8) Administration: 860 mg (12/29/2018), 860 mg (01/19/2019), 860 mg  (02/09/2019), 860 mg (03/02/2019), 860 mg (03/23/2019), 860 mg (04/13/2019), 860 mg (05/04/2019), 860 mg (05/25/2019), 860 mg (06/15/2019), 860 mg (07/06/2019), 860 mg (07/29/2019), 860 mg (08/24/2019), 860 mg (09/14/2019), 860 mg (10/05/2019) fosaprepitant (EMEND) 150 mg in sodium chloride 0.9 % 145 mL IVPB, 150 mg, Intravenous,  Once, 15 of 15 cycles Administration: 150 mg (01/19/2019), 150 mg (02/09/2019), 150 mg (03/02/2019), 150 mg (03/23/2019), 150 mg (04/13/2019), 150 mg (05/04/2019), 150 mg (05/25/2019), 150 mg (06/15/2019), 150 mg (07/06/2019), 150 mg (07/29/2019), 150 mg (08/24/2019), 150 mg (09/14/2019), 150 mg (10/05/2019), 150 mg (11/09/2019) PACLitaxel (TAXOL) 360 mg in sodium chloride 0.9 % 500 mL chemo infusion (> 44m/m2), 175 mg/m2 = 360 mg, Intravenous,  Once, 16 of 18 cycles Dose modification: 140 mg/m2 (80 % of original dose 175 mg/m2, Cycle 7, Reason: Other (see comments), Comment: hand pains), 140 mg/m2 (80 % of original dose 175 mg/m2, Cycle 8, Reason: Other (see comments), Comment: neoropathic pain), 175 mg/m2 (original dose 175 mg/m2, Cycle 15, Reason: Provider Judgment), 175 mg/m2 (original dose 175 mg/m2, Cycle 16, Reason: Provider Judgment) Administration: 360 mg (12/29/2018), 360 mg (01/19/2019), 360 mg (02/09/2019), 360 mg (03/02/2019), 360 mg (03/23/2019), 360 mg (04/13/2019), 288 mg (05/04/2019), 288 mg (05/25/2019), 288 mg (06/15/2019), 288 mg (07/06/2019), 288 mg (07/29/2019), 288 mg (08/24/2019), 288 mg (09/14/2019), 288 mg (10/05/2019), 360 mg (11/09/2019), 360 mg (12/07/2019)  for chemotherapy treatment.      CANCER STAGING: Cancer Staging No matching staging information was found for the patient.  INTERVAL HISTORY:  [name] a 59y.o. female, returns for routine  follow-up and consideration for next cycle of chemotherapy.   Due for cycle #17 of paclitaxel and Aloxi today.  Overall, she tells me she has been feeling pretty well. A little nausea and diarrhea after chemotherapy, lasts for 3  days. No need for medication for diarrhea. She takes compazine as needed for nausea. Then, she complains of some abnormal feeling in the feet for the past 2 weeks. She describes feeling that her feet are rounded and they are also more swollen from time to time. She denies any pain. No abnormal sensation in her fingers. She denies any abdominal pain, she had a couple episodes of right flank/back pain, but this is not a daily deal. No bloating, change in bowel habits or urinary habits. Constipation at baseline. Rest of the 10 point ROS reviewed and negative  PAST MEDICAL/SURGICAL HISTORY:  Past Medical History:  Diagnosis Date  . Cancer ([place])    Phreesia 09/05/2019  . Elevated blood pressure reading   . Endometrial cancer ([place])   . Hyperlipidemia   . Hypertension   . PMB (postmenopausal bleeding)   . Port-A-Cath in place 12/24/2018   Past Surgical History:  Procedure Laterality Date  . ABDOMINAL HYSTERECTOMY N/A    Phreesia 09/05/2019  . IR IMAGING GUIDED PORT INSERTION  12/28/2018  . IR RADIOLOGIST EVAL & MGMT  06/02/2018  . IR RADIOLOGIST EVAL & MGMT  06/16/2018  . NECK SURGERY  2000   spinal surgery , titanium rod in place   . ROBOTIC ASSISTED TOTAL HYSTERECTOMY WITH BILATERAL SALPINGO OOPHERECTOMY N/A 04/13/2018   Procedure: XI ROBOTIC ASSISTED TOTAL HYSTERECTOMY WITH BILATERAL SALPINGO OOPHORECTOMY;  Surgeon: Everitt Amber, MD;  Location: [facility];  Service: Gynecology;  Laterality: N/A;  . ROBOTIC PELVIC AND PARA-AORTIC LYMPH NODE DISSECTION N/A 04/13/2018   Procedure: XI ROBOTIC PELVIC LYMPHADECTOMY AND PARA-AORTIC LYMPH NODE DISSECTION;  Surgeon: Everitt Amber, MD;  Location: [facility];  Service: Gynecology;  Laterality: N/A;    SOCIAL HISTORY:  Social History   Socioeconomic History  . Marital status: Widowed    Spouse name: Not on file  . Number of children: 3  . Years of education: Not on file  . Highest education level: Not on file  Occupational History  . 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 Smoking status: Never Smoker  . Smokeless tobacco: Never Used  Vaping Use  . Vaping Use: Never used  Substance and Sexual Activity  . Alcohol use: Yes    Alcohol/week: 4.0 standard drinks    Types: 4 Glasses of wine per week    Comment: weekends  . Drug use: Never  . Sexual activity: Not Currently  Other Topics Concern  . Not on file  Social History Narrative   Lives with daughter and granddaughter here in [place], moved 3 years ago      Cats: Zoe-14 years old, Eduard Clos, and Horald Pollen      Enjoys: walking when she can tolerate- spending time with granddaughter (her life)      Diet: eats all food groups   Caffeine: 1-2 cups at times, but does not tolerate it all the time   Water: 160 oz daily      Wears seat belt    Does not use phone while driving    Oceanographer at home   Fire extinguisher-no   No weapons   Social Determinants of Health   Financial Resource Strain: [place]   . Difficulty of Paying Living  Expenses: Not hard at all  Food Insecurity: No Food Insecurity  . Worried About Charity fundraiser in the Last Year: Never true  . Ran Out of Food in the Last Year: Never true  Transportation Needs: No Transportation Needs  . Lack of Transportation (Medical): No  . Lack of Transportation (Non-Medical): No  Physical Activity:   . Days of Exercise per Week: Not on file  . Minutes of Exercise per Session: Not on file  Stress: Stress Concern Present  . Feeling of Stress : Rather much  Social Connections: Socially Isolated  . Frequency of Communication with Friends and Family: More than three times a week  . Frequency of Social Gatherings with Friends and Family: More than three times a week  . Attends Religious Services: Never  . Active Member of Clubs or Organizations: No  . Attends Archivist Meetings: Never  . Marital Status: Widowed  Intimate Partner Violence: Not At Risk  . Fear of Current or Ex-Partner:  No  . Emotionally Abused: No  . Physically Abused: No  . Sexually Abused: No    FAMILY HISTORY:  Family History  Problem Relation Age of Onset  . Hypertension Father   . Heart disease Father   . Cancer Father        lung  . Hypertension Mother   . Cancer Sister        ovarian cancer    CURRENT MEDICATIONS:  Current Outpatient Medications  Medication Sig Dispense Refill  . amLODipine (NORVASC) 5 MG tablet Take 1 tablet (5 mg total) by mouth daily. 90 tablet 1  . atorvastatin (LIPITOR) 20 MG tablet Take 1 tablet (20 mg total) by mouth daily. 90 tablet 1  . CONSTULOSE 10 GM/15ML solution Take 30 g by mouth daily as needed.     . dicyclomine (BENTYL) 20 MG tablet Take 1 tablet (20 mg total) by mouth every 6 (six) hours. 30 tablet 1  . docusate sodium (COLACE) 100 MG capsule Take 200 mg by mouth 2 (two) times daily as needed for mild constipation (when taking pain medication).    Marland Kitchen EPINEPHrine 0.3 mg/0.3 mL IJ SOAJ injection Inject 0.3 mg into the muscle as needed for anaphylaxis. 1 each 0  . gabapentin (NEURONTIN) 300 MG capsule TAKE 1 CAPSULE BY MOUTH THREE TIMES DAILY 90 capsule 6  . ibuprofen (ADVIL) 200 MG tablet Take 200 mg by mouth every 6 (six) hours as needed.     . lidocaine-prilocaine (EMLA) cream Apply small amount to port a cath site and cover with plastic wrap one hour prior to chemotherapy appointments 30 g 3  . oxyCODONE-acetaminophen (PERCOCET) 5-325 MG tablet Take 1 tablet by mouth every 8 (eight) hours as needed (pain). 30 tablet 0  . PACLITAXEL IV Inject into the vein every 21 ( twenty-one) days.    . polyethylene glycol (MIRALAX / GLYCOLAX) 17 g packet Take 17 g by mouth 2 (two) times daily as needed.     . predniSONE (DELTASONE) 10 MG tablet Take 6 tablets day one, 5 tablets day two, 4 tablets day three, 3 tablets day four, 2 tablets day five, then 1 tablet day six 21 tablet 0  . prochlorperazine (COMPAZINE) 10 MG tablet Take 1 tablet (10 mg total) by mouth every 6  (six) hours as needed (Nausea or vomiting). 30 tablet 1   No current facility-administered medications for this visit.    ALLERGIES:  Allergies  Allergen Reactions  . Carboplatin Other (See Comments)    "  body on fire and abdominal pain"  . Nickel Rash    itchy    PHYSICAL EXAM:  Performance status (ECOG): 1 - Symptomatic but completely ambulatory  Vitals:   12/28/19 0824  BP: (!) 142/72  Pulse: 92  Resp: 18  Temp: (!) 96.8 F (36 C)  SpO2: 98%   Wt Readings from Last 3 Encounters:  12/28/19 235 lb 7.2 oz (106.8 kg)  12/08/19 235 lb (106.6 kg)  12/07/19 238 lb 6.4 oz (108.1 kg)   Physical Exam Vitals reviewed.  Constitutional:      Appearance: Normal appearance. She is obese.  Cardiovascular:     Rate and Rhythm: Normal rate and regular rhythm.     Pulses: Normal pulses.     Heart sounds: Normal heart sounds.  Pulmonary:     Effort: Pulmonary effort is normal.     Breath sounds: Normal breath sounds.  Chest:     Comments: Port-a-Cath in R chest Abdominal:     Palpations: Abdomen is soft. There is no mass.     Tenderness: There is no abdominal tenderness.     Hernia: No hernia is present.  Musculoskeletal:     Right lower leg: No edema.     Left lower leg: No edema.  Neurological:     General: No focal deficit present.     Mental Status: She is alert and oriented to person, place, and time.  Psychiatric:        Mood and Affect: Mood normal.        Behavior: Behavior normal.     LABORATORY DATA:  I have reviewed the labs as listed.  CBC Latest Ref Rng & Units 12/28/2019 12/07/2019 11/09/2019  WBC 4.0 - 10.5 K/uL 6.5 5.4 4.8  Hemoglobin 12.0 - 15.0 g/dL 12.0 11.3(L) 11.1(L)  Hematocrit 36 - 46 % 37.3 35.0(L) 33.8(L)  Platelets 150 - 400 K/uL 305 223 244   CMP Latest Ref Rng & Units 12/28/2019 12/07/2019 11/09/2019  Glucose 70 - 99 mg/dL 117(H) 107(H) 116(H)  BUN 6 - 20 mg/dL _0 Creatinine 0.44 - 1.00 mg/dL 0.73 0.60 0.53  Sodium 135 - 145  mmol/L 138 141 140  Potassium 3.5 - 5.1 mmol/L 3.7 3.7 3.7  Chloride 98 - 111 mmol/L 104 105 103  CO2 22 - 32 mmol/L _1 Calcium 8.9 - 10.3 mg/dL 9.3 9.0 9.3  Total Protein 6.5 - 8.1 g/dL 7.7 6.9 7.1  Total Bilirubin 0.3 - 1.2 mg/dL 0.4 0.4 0.5  Alkaline Phos 38 - 126 U/L 100 84 90  AST 15 - 41 U/L _2 ALT 0 - 44 U/L _3 No results found for: CA125  DIAGNOSTIC IMAGING:  I have independently reviewed the scans and discussed with the patient. No results found.   ASSESSMENT:   1. Recurrent endometrial carcinosarcoma: -TAH, BSO, bilateral pelvic and para-aortic lymph node resections on 04/13/2018. -Pathology showed carcinosarcoma (malignant mixed mullerian tumor) is arising in an endometrial type polyp with no myometrial invasion identified. High-grade. 0/39 lymph nodes positive. PT1APN0, FIGO stage Ia. MMR normal. MSI-stable. -CTAP on 12/09/2018 for abdominal pain showed extensive peritoneal carcinomatosis and large soft tissue mass in the pelvis. -Biopsy of the omental mass on 12/28/2018 shows poorly differentiated carcinoma consistent with her prior malignancy. -Carboplatin and paclitaxel started on 12/29/2018. -CT scan on 05/02/2019 showed peritoneal implants in the low central small bowel mesentery and left pelvic sidewall have decreased in size measuring 1.5 x  1.9 cm and 1.7 x 2.2 cm. Soft tissue nodule in the left lateral omentum measures 1 x 1.7 cm with no evidence of metastatic disease. -Continuation of chemotherapy until complete response was recommended. -CT AP on 07/04/2019 showed continued positive response to therapy with scattered peritoneal metastasis in the pelvis and left omentum decreased in size. No new metastatic disease. -CTAP on 10/24/2019 after 14 cycles showed substantial reduction in size of soft tissue nodules in the pelvis. 1.4 x 0.9 x 1 cm soft tissue density in the inferior serosal margin of the sigmoid colon, previously 1.7 x 1.1 x 1.7 cm. 2  small soft tissue nodules in the mesenteric adipose tissue above the urinary bladder measures 0.8 cm. -She has developed serious allergic reaction during cycle 14 with carboplatin. - She is now on single agent paclitaxel. -  Other options include adding ifosfamide to Taxol or Doxil and combination of lenvatinib with pembrolizumab.  In the lenvatinib pembrolizumab trial carcinosarcomas were included.  2. Breast abnormality: -Mammogram on 05/02/2019 shows BI-RADS Category 0. She reportedly took Covid shot 1 to 2 weeks prior to the mammogram. She has repeat mammogram on 01/10/2020  3. Peripheral neuropathy: -She reported numbness and occasional pins-and-needles sensation in the bilateral toes, right more than left. This has started about a week ago.   PLAN:  1. Recurrent endometrial carcinosarcoma: -She has tolerated single agent paclitaxel without any allergic reactions. -she is however describing more neuropathy, hence could consider dose modification, she wants to talk to Dr Raliegh Ip before considering a dose modification. Discussed about the possibility of permanent neuropathy from the medication - labs pending, if within parameters,ok to proceed with chemotherapy as planned today - Her scans need to be scheduled,  RTC in 3 weeks  2.Low back pain/right-sided abdominal pain: - intermittent, not bothersome  3. Hypertension: -Continue Norvasc 5 mg daily.  4. Neuropathy: -Continue gabapentin 300 mg 3 times a day.  This is predominantly in the feet.  No worsening since I increased the paclitaxel dose 3 weeks ago.  5.  Abnormal mammogram: - Repeat imaging pending.   Orders placed this encounter:  No orders of the defined types were placed in this encounter.  Time I spent 30 minutes in the care of this patient including H and P, review of records, counseling and coordination of care.  Benay Pike MD

## 2019-12-28 ENCOUNTER — Encounter (HOSPITAL_COMMUNITY): Payer: Self-pay | Admitting: Hematology and Oncology

## 2019-12-28 ENCOUNTER — Inpatient Hospital Stay (HOSPITAL_BASED_OUTPATIENT_CLINIC_OR_DEPARTMENT_OTHER): Payer: Medicaid Other | Admitting: Hematology and Oncology

## 2019-12-28 ENCOUNTER — Other Ambulatory Visit: Payer: Self-pay

## 2019-12-28 ENCOUNTER — Inpatient Hospital Stay (HOSPITAL_COMMUNITY): Payer: Medicaid Other

## 2019-12-28 ENCOUNTER — Inpatient Hospital Stay (HOSPITAL_COMMUNITY): Payer: Medicaid Other | Attending: Hematology

## 2019-12-28 VITALS — BP 136/71 | HR 74 | Temp 97.1°F | Resp 18

## 2019-12-28 VITALS — BP 142/72 | HR 92 | Temp 96.8°F | Resp 18 | Wt 235.5 lb

## 2019-12-28 DIAGNOSIS — Z90722 Acquired absence of ovaries, bilateral: Secondary | ICD-10-CM | POA: Diagnosis not present

## 2019-12-28 DIAGNOSIS — G629 Polyneuropathy, unspecified: Secondary | ICD-10-CM | POA: Insufficient documentation

## 2019-12-28 DIAGNOSIS — E785 Hyperlipidemia, unspecified: Secondary | ICD-10-CM | POA: Insufficient documentation

## 2019-12-28 DIAGNOSIS — Z7952 Long term (current) use of systemic steroids: Secondary | ICD-10-CM | POA: Insufficient documentation

## 2019-12-28 DIAGNOSIS — Z90710 Acquired absence of both cervix and uterus: Secondary | ICD-10-CM | POA: Diagnosis not present

## 2019-12-28 DIAGNOSIS — C541 Malignant neoplasm of endometrium: Secondary | ICD-10-CM

## 2019-12-28 DIAGNOSIS — I10 Essential (primary) hypertension: Secondary | ICD-10-CM | POA: Insufficient documentation

## 2019-12-28 DIAGNOSIS — Z8041 Family history of malignant neoplasm of ovary: Secondary | ICD-10-CM | POA: Diagnosis not present

## 2019-12-28 DIAGNOSIS — K5900 Constipation, unspecified: Secondary | ICD-10-CM | POA: Diagnosis not present

## 2019-12-28 DIAGNOSIS — Z95828 Presence of other vascular implants and grafts: Secondary | ICD-10-CM

## 2019-12-28 DIAGNOSIS — Z79899 Other long term (current) drug therapy: Secondary | ICD-10-CM | POA: Insufficient documentation

## 2019-12-28 DIAGNOSIS — Z801 Family history of malignant neoplasm of trachea, bronchus and lung: Secondary | ICD-10-CM | POA: Insufficient documentation

## 2019-12-28 DIAGNOSIS — Z5111 Encounter for antineoplastic chemotherapy: Secondary | ICD-10-CM | POA: Diagnosis not present

## 2019-12-28 DIAGNOSIS — Z8249 Family history of ischemic heart disease and other diseases of the circulatory system: Secondary | ICD-10-CM | POA: Insufficient documentation

## 2019-12-28 DIAGNOSIS — C786 Secondary malignant neoplasm of retroperitoneum and peritoneum: Secondary | ICD-10-CM | POA: Diagnosis present

## 2019-12-28 LAB — CBC WITH DIFFERENTIAL/PLATELET
Abs Immature Granulocytes: 0.03 10*3/uL (ref 0.00–0.07)
Basophils Absolute: 0.1 10*3/uL (ref 0.0–0.1)
Basophils Relative: 1 %
Eosinophils Absolute: 0.1 10*3/uL (ref 0.0–0.5)
Eosinophils Relative: 2 %
HCT: 37.3 % (ref 36.0–46.0)
Hemoglobin: 12 g/dL (ref 12.0–15.0)
Immature Granulocytes: 1 %
Lymphocytes Relative: 43 %
Lymphs Abs: 2.8 10*3/uL (ref 0.7–4.0)
MCH: 30.6 pg (ref 26.0–34.0)
MCHC: 32.2 g/dL (ref 30.0–36.0)
MCV: 95.2 fL (ref 80.0–100.0)
Monocytes Absolute: 0.6 10*3/uL (ref 0.1–1.0)
Monocytes Relative: 9 %
Neutro Abs: 3 10*3/uL (ref 1.7–7.7)
Neutrophils Relative %: 44 %
Platelets: 305 10*3/uL (ref 150–400)
RBC: 3.92 MIL/uL (ref 3.87–5.11)
RDW: 13.6 % (ref 11.5–15.5)
WBC: 6.5 10*3/uL (ref 4.0–10.5)
nRBC: 0 % (ref 0.0–0.2)

## 2019-12-28 LAB — COMPREHENSIVE METABOLIC PANEL
ALT: 31 U/L (ref 0–44)
AST: 23 U/L (ref 15–41)
Albumin: 4.3 g/dL (ref 3.5–5.0)
Alkaline Phosphatase: 100 U/L (ref 38–126)
Anion gap: 11 (ref 5–15)
BUN: 16 mg/dL (ref 6–20)
CO2: 23 mmol/L (ref 22–32)
Calcium: 9.3 mg/dL (ref 8.9–10.3)
Chloride: 104 mmol/L (ref 98–111)
Creatinine, Ser: 0.73 mg/dL (ref 0.44–1.00)
GFR, Estimated: >60 mL/min (ref >60)
Glucose, Bld: 117 mg/dL — ABNORMAL HIGH (ref 70–99)
Potassium: 3.7 mmol/L (ref 3.5–5.1)
Sodium: 138 mmol/L (ref 135–145)
Total Bilirubin: 0.4 mg/dL (ref 0.3–1.2)
Total Protein: 7.7 g/dL (ref 6.5–8.1)

## 2019-12-28 MED ORDER — SODIUM CHLORIDE 0.9 % IV SOLN
175.0000 mg/m2 | Freq: Once | INTRAVENOUS | Status: AC
  Administered 2019-12-28: 360 mg via INTRAVENOUS
  Filled 2019-12-28: qty 60

## 2019-12-28 MED ORDER — HEPARIN SOD (PORK) LOCK FLUSH 100 UNIT/ML IV SOLN
500.0000 [IU] | Freq: Once | INTRAVENOUS | Status: AC | PRN
  Administered 2019-12-28: 500 [IU]

## 2019-12-28 MED ORDER — SODIUM CHLORIDE 0.9 % IV SOLN
10.0000 mg | Freq: Once | INTRAVENOUS | Status: AC
  Administered 2019-12-28: 10 mg via INTRAVENOUS
  Filled 2019-12-28: qty 10

## 2019-12-28 MED ORDER — DIPHENHYDRAMINE HCL 50 MG/ML IJ SOLN
50.0000 mg | Freq: Once | INTRAMUSCULAR | Status: AC
  Administered 2019-12-28: 50 mg via INTRAVENOUS
  Filled 2019-12-28: qty 1

## 2019-12-28 MED ORDER — FAMOTIDINE IN NACL 20-0.9 MG/50ML-% IV SOLN
20.0000 mg | Freq: Once | INTRAVENOUS | Status: AC
  Administered 2019-12-28: 20 mg via INTRAVENOUS
  Filled 2019-12-28: qty 50

## 2019-12-28 MED ORDER — SODIUM CHLORIDE 0.9 % IV SOLN: Freq: Once | INTRAVENOUS | Status: AC

## 2019-12-28 MED ORDER — SODIUM CHLORIDE 0.9% FLUSH
10.0000 mL | INTRAVENOUS | Status: DC | PRN
  Administered 2019-12-28: 10 mL

## 2019-12-28 MED ORDER — PALONOSETRON HCL INJECTION 0.25 MG/5ML
0.2500 mg | Freq: Once | INTRAVENOUS | Status: AC
  Administered 2019-12-28: 0.25 mg via INTRAVENOUS
  Filled 2019-12-28: qty 5

## 2019-12-28 NOTE — Patient Instructions (Signed)
[place] at [facility] Discharge Instructions  You were seen today by Dr. Chryl Heck.  You will receive your treatment today. We will set you up for scans in a few weeks and then you will follow up with [place] and Dr. Raliegh Ip.     Thank you for choosing [place] at [facility] to provide your oncology and hematology care.  To afford each patient quality time with our provider, please arrive at least 15 minutes before your scheduled appointment time.   If you have a lab appointment with the [facility] please come in thru the Main Entrance and check in at the main information desk.  You need to re-schedule your appointment should you arrive 10 or more minutes late.  We strive to give you quality time with our providers, and arriving late affects you and other patients whose appointments are after yours.  Also, if you no show three or more times for appointments you may be dismissed from the clinic at the providers discretion.     Again, thank you for choosing [facility].  Our hope is that these requests will decrease the amount of time that you wait before being seen by our physicians.       _____________________________________________________________  Should you have questions after your visit to [facility], please contact our office at [phone] and follow the prompts.  Our office hours are 8:00 a.m. and 4:30 p.m. Monday - Friday.  Please note that voicemails left after 4:00 p.m. may not be returned until the following business day.  We are closed weekends and major holidays.  You do have access to a nurse 24-7, just call the main number to the clinic [phone] and do not press any options, hold on the line and a nurse will answer the phone.    For prescription refill requests, have your pharmacy contact our office and allow 72 hours.    Due to Covid, you will need to wear a mask upon entering the hospital. If you do not  have a mask, a mask will be given to you at the Main Entrance upon arrival. For doctor visits, patients may have 1 support person age 30 or older with them. For treatment visits, patients can not have anyone with them due to social distancing guidelines and our immunocompromised population.

## 2019-12-28 NOTE — Progress Notes (Signed)
Patient was assessed by Dr. Katragadda and labs have been reviewed.  Patient is okay to proceed with treatment today. Primary RN and pharmacy aware.   

## 2019-12-28 NOTE — Progress Notes (Signed)
Labs reviewed with Dr. Chryl Heck with verbal order ok to treat today.   Patient tolerated chemotherapy with no complaints voiced.  Side effects with management reviewed with understanding verbalized.  Port site clean and dry with no bruising or swelling noted at site.  Good blood return noted before and after administration of chemotherapy.  Band aid applied.  Patient left in satisfactory condition with VSS and no s/s of distress noted.

## 2019-12-29 LAB — CA 125: Cancer Antigen (CA) 125: 5.7 U/mL (ref 0.0–38.1)

## 2019-12-30 ENCOUNTER — Ambulatory Visit (HOSPITAL_COMMUNITY): Payer: Medicaid Other

## 2020-01-10 ENCOUNTER — Ambulatory Visit (HOSPITAL_COMMUNITY)
Admission: RE | Admit: 2020-01-10 | Discharge: 2020-01-10 | Disposition: A | Discharge status: Home / Self Care | Payer: Medicaid Other | Source: Ambulatory Visit | Attending: Hematology | Admitting: Hematology

## 2020-01-10 ENCOUNTER — Other Ambulatory Visit: Payer: Self-pay

## 2020-01-10 DIAGNOSIS — R928 Other abnormal and inconclusive findings on diagnostic imaging of breast: Secondary | ICD-10-CM

## 2020-01-10 DIAGNOSIS — C541 Malignant neoplasm of endometrium: Secondary | ICD-10-CM | POA: Insufficient documentation

## 2020-01-10 DIAGNOSIS — Z1231 Encounter for screening mammogram for malignant neoplasm of breast: Secondary | ICD-10-CM | POA: Diagnosis present

## 2020-01-10 MED ORDER — IOHEXOL 300 MG/ML  SOLN
100.0000 mL | Freq: Once | INTRAMUSCULAR | Status: AC | PRN
  Administered 2020-01-10: 12:00:00 100 mL via INTRAVENOUS

## 2020-01-18 ENCOUNTER — Other Ambulatory Visit: Payer: Self-pay

## 2020-01-18 ENCOUNTER — Inpatient Hospital Stay (HOSPITAL_COMMUNITY): Payer: Medicaid Other

## 2020-01-18 ENCOUNTER — Inpatient Hospital Stay (HOSPITAL_BASED_OUTPATIENT_CLINIC_OR_DEPARTMENT_OTHER): Payer: Medicaid Other | Admitting: Hematology

## 2020-01-18 VITALS — BP 131/76 | HR 81 | Temp 98.4°F | Resp 18

## 2020-01-18 VITALS — BP 142/84 | HR 83 | Temp 96.8°F | Resp 18 | Wt 238.4 lb

## 2020-01-18 DIAGNOSIS — C541 Malignant neoplasm of endometrium: Secondary | ICD-10-CM

## 2020-01-18 DIAGNOSIS — Z5111 Encounter for antineoplastic chemotherapy: Secondary | ICD-10-CM | POA: Diagnosis not present

## 2020-01-18 DIAGNOSIS — Z95828 Presence of other vascular implants and grafts: Secondary | ICD-10-CM

## 2020-01-18 LAB — COMPREHENSIVE METABOLIC PANEL
ALT: 24 U/L (ref 0–44)
AST: 19 U/L (ref 15–41)
Albumin: 3.8 g/dL (ref 3.5–5.0)
Alkaline Phosphatase: 91 U/L (ref 38–126)
Anion gap: 9 (ref 5–15)
BUN: 13 mg/dL (ref 6–20)
CO2: 24 mmol/L (ref 22–32)
Calcium: 9 mg/dL (ref 8.9–10.3)
Chloride: 108 mmol/L (ref 98–111)
Creatinine, Ser: 0.58 mg/dL (ref 0.44–1.00)
GFR, Estimated: >60 mL/min (ref >60)
Glucose, Bld: 110 mg/dL — ABNORMAL HIGH (ref 70–99)
Potassium: 3.8 mmol/L (ref 3.5–5.1)
Sodium: 141 mmol/L (ref 135–145)
Total Bilirubin: 0.3 mg/dL (ref 0.3–1.2)
Total Protein: 6.7 g/dL (ref 6.5–8.1)

## 2020-01-18 LAB — CBC WITH DIFFERENTIAL/PLATELET
Abs Immature Granulocytes: 0.04 10*3/uL (ref 0.00–0.07)
Basophils Absolute: 0.1 10*3/uL (ref 0.0–0.1)
Basophils Relative: 1 %
Eosinophils Absolute: 0.1 10*3/uL (ref 0.0–0.5)
Eosinophils Relative: 2 %
HCT: 34.9 % — ABNORMAL LOW (ref 36.0–46.0)
Hemoglobin: 11.3 g/dL — ABNORMAL LOW (ref 12.0–15.0)
Immature Granulocytes: 1 %
Lymphocytes Relative: 38 %
Lymphs Abs: 2.1 10*3/uL (ref 0.7–4.0)
MCH: 30.6 pg (ref 26.0–34.0)
MCHC: 32.4 g/dL (ref 30.0–36.0)
MCV: 94.6 fL (ref 80.0–100.0)
Monocytes Absolute: 0.6 10*3/uL (ref 0.1–1.0)
Monocytes Relative: 11 %
Neutro Abs: 2.6 10*3/uL (ref 1.7–7.7)
Neutrophils Relative %: 47 %
Platelets: 279 10*3/uL (ref 150–400)
RBC: 3.69 MIL/uL — ABNORMAL LOW (ref 3.87–5.11)
RDW: 14.2 % (ref 11.5–15.5)
WBC: 5.5 10*3/uL (ref 4.0–10.5)
nRBC: 0 % (ref 0.0–0.2)

## 2020-01-18 MED ORDER — SODIUM CHLORIDE 0.9 % IV SOLN
140.0000 mg/m2 | Freq: Once | INTRAVENOUS | Status: AC
  Administered 2020-01-18: 11:00:00 288 mg via INTRAVENOUS
  Filled 2020-01-18: qty 48

## 2020-01-18 MED ORDER — HEPARIN SOD (PORK) LOCK FLUSH 100 UNIT/ML IV SOLN
500.0000 [IU] | Freq: Once | INTRAVENOUS | Status: AC | PRN
  Administered 2020-01-18: 15:00:00 500 [IU]

## 2020-01-18 MED ORDER — PALONOSETRON HCL INJECTION 0.25 MG/5ML
0.2500 mg | Freq: Once | INTRAVENOUS | Status: AC
  Administered 2020-01-18: 10:00:00 0.25 mg via INTRAVENOUS
  Filled 2020-01-18: qty 5

## 2020-01-18 MED ORDER — DIPHENHYDRAMINE HCL 50 MG/ML IJ SOLN
50.0000 mg | Freq: Once | INTRAMUSCULAR | Status: AC
  Administered 2020-01-18: 10:00:00 50 mg via INTRAVENOUS
  Filled 2020-01-18: qty 1

## 2020-01-18 MED ORDER — SODIUM CHLORIDE 0.9% FLUSH
10.0000 mL | INTRAVENOUS | Status: DC | PRN
  Administered 2020-01-18 (×2): 10 mL

## 2020-01-18 MED ORDER — FAMOTIDINE IN NACL 20-0.9 MG/50ML-% IV SOLN
20.0000 mg | Freq: Once | INTRAVENOUS | Status: AC
Start: 2020-01-18 — End: 2020-01-18
  Administered 2020-01-18: 10:00:00 20 mg via INTRAVENOUS
  Filled 2020-01-18: qty 50

## 2020-01-18 MED ORDER — SODIUM CHLORIDE 0.9 % IV SOLN: Freq: Once | INTRAVENOUS | Status: AC

## 2020-01-18 MED ORDER — SODIUM CHLORIDE 0.9 % IV SOLN
10.0000 mg | Freq: Once | INTRAVENOUS | Status: AC
  Administered 2020-01-18: 11:00:00 10 mg via INTRAVENOUS
  Filled 2020-01-18: qty 10

## 2020-01-18 NOTE — Patient Instructions (Signed)
[facility] Discharge Instructions for Patients Receiving Chemotherapy   Beginning January 23rd 2017 lab work for the Cancer Center will be done in the  Main lab at [place] on 1st floor. If you have a lab appointment with the Cancer Center please come in thru the  Main Entrance and check in at the main information desk   Today you received the following chemotherapy agents Taxol. Follow-up as scheduled  To help prevent nausea and vomiting after your treatment, we encourage you to take your nausea medication   If you develop nausea and vomiting, or diarrhea that is not controlled by your medication, call the clinic.  The clinic phone number is [phone]. Office hours are Monday-Friday 8:30am-5:00pm.  BELOW ARE SYMPTOMS THAT SHOULD BE REPORTED IMMEDIATELY:  *FEVER GREATER THAN 101.0 F  *CHILLS WITH OR WITHOUT FEVER  NAUSEA AND VOMITING THAT IS NOT CONTROLLED WITH YOUR NAUSEA MEDICATION  *UNUSUAL SHORTNESS OF BREATH  *UNUSUAL BRUISING OR BLEEDING  TENDERNESS IN MOUTH AND THROAT WITH OR WITHOUT PRESENCE OF ULCERS  *URINARY PROBLEMS  *BOWEL PROBLEMS  UNUSUAL RASH Items with * indicate a potential emergency and should be followed up as soon as possible. If you have an emergency after office hours please contact your primary care physician or go to the nearest emergency department.  Please call the clinic during office hours if you have any questions or concerns.   You may also contact the Patient Navigator at [phone] should you have any questions or need assistance in obtaining follow up care.      Resources For Cancer Patients and their Caregivers ? [employer]: Can assist with transportation, wigs, general needs, runs Look Good Feel Better.        1-888-227-6333 ? Cancer Care: Provides financial assistance, online support groups, medication/co-pay assistance.  1-800-813-HOPE (4673) ? [facility] Assists  [place] cancer patients and their families through emotional , educational and financial support.  336-427-4357 ? [place] DSS Where to apply for food stamps, Medicaid and utility assistance. 336-342-1394 ? [facility]: Transportation to medical appointments. 336-347-2287 ? Social Security Administration: May apply for disability if have a Stage IV cancer. 336-342-7796 1-800-772-1213 ? [place] [facility], Dis[facility] Transit Services: Assists with nutrition, care and transit needs. 336-349-2343         

## 2020-01-18 NOTE — Progress Notes (Signed)
Patients port flushed without difficulty.  Good blood return noted with no bruising or swelling noted at site.  Transparent dressing applied.  Patient left accessed for chemotherapy treatment. 

## 2020-01-18 NOTE — Progress Notes (Signed)
Jessica [name], [place] [address]   CLINIC:  Medical Oncology/Hematology  PCP:  Perlie Mayo, NP [address] / [place] [address] [phone]   REASON FOR VISIT:  Follow-up for recurrent endometrial cancer  PRIOR THERAPY: TAH & BSO, bilateral pelvic and para-aortic lymph nodes dissections on 04/13/2018  NGS Results: Not done  CURRENT THERAPY: Paclitaxel & Aloxi every 3 weeks  BRIEF ONCOLOGIC HISTORY:  Oncology History  Endometrial cancer (Jessica [name])  03/31/2018 Initial Diagnosis   Endometrial cancer (Jessica [name])   12/29/2018 -  Chemotherapy   The patient had palonosetron (ALOXI) injection 0.25 mg, 0.25 mg, Intravenous,  Once, 17 of 19 cycles Administration: 0.25 mg (12/29/2018), 0.25 mg (01/19/2019), 0.25 mg (02/09/2019), 0.25 mg (03/02/2019), 0.25 mg (03/23/2019), 0.25 mg (04/13/2019), 0.25 mg (05/04/2019), 0.25 mg (05/25/2019), 0.25 mg (06/15/2019), 0.25 mg (07/06/2019), 0.25 mg (07/29/2019), 0.25 mg (08/24/2019), 0.25 mg (09/14/2019), 0.25 mg (10/05/2019), 0.25 mg (11/09/2019), 0.25 mg (12/07/2019), 0.25 mg (12/28/2019) pegfilgrastim-cbqv (UDENYCA) injection 6 mg, 6 mg, Subcutaneous, Once, 15 of 15 cycles Administration: 6 mg (12/31/2018), 6 mg (01/20/2019), 6 mg (02/11/2019), 6 mg (03/04/2019), 6 mg (03/25/2019), 6 mg (04/15/2019), 6 mg (05/06/2019), 6 mg (05/27/2019), 6 mg (06/17/2019), 6 mg (07/08/2019), 6 mg (08/01/2019), 6 mg (08/26/2019), 6 mg (09/16/2019), 6 mg (10/07/2019) CARBOplatin (PARAPLATIN) 860 mg in sodium chloride 0.9 % 500 mL chemo infusion, 860 mg (100 % of original dose 862.8 mg), Intravenous,  Once, 14 of 14 cycles Dose modification:   (original dose 862.8 mg, Cycle 1),   (original dose 862.8 mg, Cycle 2),   (original dose 862.8 mg, Cycle 3),   (original dose 862.8 mg, Cycle 4),   (original dose 862.8 mg, Cycle 5),   (original dose 862.8 mg, Cycle 6),   (original dose 862.8 mg, Cycle 8),   (original dose 862.8 mg, Cycle 8) Administration: 860 mg (12/29/2018), 860 mg  (01/19/2019), 860 mg (02/09/2019), 860 mg (03/02/2019), 860 mg (03/23/2019), 860 mg (04/13/2019), 860 mg (05/04/2019), 860 mg (05/25/2019), 860 mg (06/15/2019), 860 mg (07/06/2019), 860 mg (07/29/2019), 860 mg (08/24/2019), 860 mg (09/14/2019), 860 mg (10/05/2019) fosaprepitant (EMEND) 150 mg in sodium chloride 0.9 % 145 mL IVPB, 150 mg, Intravenous,  Once, 15 of 15 cycles Administration: 150 mg (01/19/2019), 150 mg (02/09/2019), 150 mg (03/02/2019), 150 mg (03/23/2019), 150 mg (04/13/2019), 150 mg (05/04/2019), 150 mg (05/25/2019), 150 mg (06/15/2019), 150 mg (07/06/2019), 150 mg (07/29/2019), 150 mg (08/24/2019), 150 mg (09/14/2019), 150 mg (10/05/2019), 150 mg (11/09/2019) PACLitaxel (TAXOL) 360 mg in sodium chloride 0.9 % 500 mL chemo infusion (> 12m/m2), 175 mg/m2 = 360 mg, Intravenous,  Once, 17 of 19 cycles Dose modification: 140 mg/m2 (80 % of original dose 175 mg/m2, Cycle 7, Reason: Other (see comments), Comment: hand pains), 140 mg/m2 (80 % of original dose 175 mg/m2, Cycle 8, Reason: Other (see comments), Comment: neoropathic pain), 175 mg/m2 (original dose 175 mg/m2, Cycle 15, Reason: Provider Judgment), 175 mg/m2 (original dose 175 mg/m2, Cycle 16, Reason: Provider Judgment), 140 mg/m2 (80 % of original dose 175 mg/m2, Cycle 18, Reason: Other (see comments), Comment: neuropathy) Administration: 360 mg (12/29/2018), 360 mg (01/19/2019), 360 mg (02/09/2019), 360 mg (03/02/2019), 360 mg (03/23/2019), 360 mg (04/13/2019), 288 mg (05/04/2019), 288 mg (05/25/2019), 288 mg (06/15/2019), 288 mg (07/06/2019), 288 mg (07/29/2019), 288 mg (08/24/2019), 288 mg (09/14/2019), 288 mg (10/05/2019), 360 mg (11/09/2019), 360 mg (12/07/2019), 360 mg (12/28/2019)  for chemotherapy treatment.      CANCER STAGING: Cancer Staging No  matching staging information was found for the patient.  INTERVAL HISTORY:  [name], a 59 y.o. female, returns for routine follow-up and consideration for next cycle of chemotherapy. [name] was last seen by Dr.  Praveena Iruku on 12/28/2019.  Due for cycle #18 of paclitaxel and Aloxi today.   Overall, she tells me she has been feeling okay. She tolerated the previous treatment well. She continues having numbness and tingling in both feet and feeling unstable while standing for the past 5 weeks; she denies having any falls or numbness in her hands. She is taking gabapentin BID since she gets tired if she takes it TID. She complains of having lower back pain if she stands for a prolonged time and takes Percocet PRN. She has occasional mild abdominal pain, but denies N/V/D.  Overall, she feels ready for next cycle of chemo today.    REVIEW OF SYSTEMS:  Review of Systems  Constitutional: Positive for fatigue (50%). Negative for appetite change.  Gastrointestinal: Positive for abdominal pain (occasionally mild pain). Negative for diarrhea, nausea and vomiting.  Musculoskeletal: Positive for back pain (lower back pain w/ prolonged standing), gait problem (feeling unstable while standing) and myalgias (4/10 legs pain).  Neurological: Positive for gait problem (feeling unstable while standing) and numbness (& tingling in feet).  Psychiatric/Behavioral: Positive for depression. The patient is nervous/anxious.   All other systems reviewed and are negative.   PAST MEDICAL/SURGICAL HISTORY:  Past Medical History:  Diagnosis Date  . Cancer (HCC)    Phreesia 09/05/2019  . Elevated blood pressure reading   . Endometrial cancer (HCC)   . Hyperlipidemia   . Hypertension   . PMB (postmenopausal bleeding)   . Port-A-Cath in place 12/24/2018   Past Surgical History:  Procedure Laterality Date  . ABDOMINAL HYSTERECTOMY N/A    Phreesia 09/05/2019  . IR IMAGING GUIDED PORT INSERTION  12/28/2018  . IR RADIOLOGIST EVAL & MGMT  06/02/2018  . IR RADIOLOGIST EVAL & MGMT  06/16/2018  . NECK SURGERY  2000   spinal surgery , titanium rod in place   . ROBOTIC ASSISTED TOTAL HYSTERECTOMY WITH BILATERAL SALPINGO  OOPHERECTOMY N/A 04/13/2018   Procedure: XI ROBOTIC ASSISTED TOTAL HYSTERECTOMY WITH BILATERAL SALPINGO OOPHORECTOMY;  Surgeon: Rossi, Emma, MD;  Location: [facility];  Service: Gynecology;  Laterality: N/A;  . ROBOTIC PELVIC AND PARA-AORTIC LYMPH NODE DISSECTION N/A 04/13/2018   Procedure: XI ROBOTIC PELVIC LYMPHADECTOMY AND PARA-AORTIC LYMPH NODE DISSECTION;  Surgeon: Rossi, Emma, MD;  Location: [facility];  Service: Gynecology;  Laterality: N/A;    SOCIAL HISTORY:  Social History   Socioeconomic History  . Marital status: Widowed    Spouse name: Not on file  . Number of children: 3  . Years of education: Not on file  . Highest education level: Not on file  Occupational History  . 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 Smoking status: Never Smoker  . Smokeless tobacco: Never Used  Vaping Use  . Vaping Use: Never used  Substance and Sexual Activity  . Alcohol use: Yes    Alcohol/week: 4.0 standard drinks    Types: 4 Glasses of wine per week    Comment: weekends  . Drug use: Never  . Sexual activity: Not Currently  Other Topics Concern  . Not on file  Social History Narrative   Lives with daughter and granddaughter here in [place], moved 3 years ago      Cats: Zoe-21 years old,   Charlie, and Storm      Enjoys: walking when she can tolerate- spending time with granddaughter (her life)      Diet: eats all food groups   Caffeine: 1-2 cups at times, but does not tolerate it all the time   Water: 160 oz daily      Wears seat belt    Does not use phone while driving    Smoke detectors at home   Fire extinguisher-no   No weapons   Social Determinants of Health   Financial Resource Strain: Low Risk   . Difficulty of Paying Living Expenses: Not hard at all  Food Insecurity: No Food Insecurity  . Worried About Running Out of Food in the Last Year: Never true  . Ran Out of Food in the Last Year: Never true  Transportation Needs: No Transportation Needs  .  Lack of Transportation (Medical): No  . Lack of Transportation (Non-Medical): No  Physical Activity: Inactive  . Days of Exercise per Week: 0 days  . Minutes of Exercise per Session: 0 min  Stress: Stress Concern Present  . Feeling of Stress : Rather much  Social Connections: Socially Isolated  . Frequency of Communication with Friends and Family: More than three times a week  . Frequency of Social Gatherings with Friends and Family: More than three times a week  . Attends Religious Services: Never  . Active Member of Clubs or Organizations: No  . Attends Club or Organization Meetings: Never  . Marital Status: Widowed  Intimate Partner Violence: Not At Risk  . Fear of Current or Ex-Partner: No  . Emotionally Abused: No  . Physically Abused: No  . Sexually Abused: No    FAMILY HISTORY:  Family History  Problem Relation Age of Onset  . Hypertension Father   . Heart disease Father   . Cancer Father        lung  . Hypertension Mother   . Cancer Sister        ovarian cancer    CURRENT MEDICATIONS:  Current Outpatient Medications  Medication Sig Dispense Refill  . amLODipine (NORVASC) 5 MG tablet Take 1 tablet (5 mg total) by mouth daily. 90 tablet 1  . atorvastatin (LIPITOR) 20 MG tablet Take 1 tablet (20 mg total) by mouth daily. 90 tablet 1  . CONSTULOSE 10 GM/15ML solution Take 30 g by mouth daily as needed.     . dicyclomine (BENTYL) 20 MG tablet Take 1 tablet (20 mg total) by mouth every 6 (six) hours. 30 tablet 1  . docusate sodium (COLACE) 100 MG capsule Take 200 mg by mouth 2 (two) times daily as needed for mild constipation (when taking pain medication).    . EPINEPHrine 0.3 mg/0.3 mL IJ SOAJ injection Inject 0.3 mg into the muscle as needed for anaphylaxis. 1 each 0  . gabapentin (NEURONTIN) 300 MG capsule TAKE 1 CAPSULE BY MOUTH THREE TIMES DAILY 90 capsule 6  . ibuprofen (ADVIL) 200 MG tablet Take 200 mg by mouth every 6 (six) hours as needed.     .  lidocaine-prilocaine (EMLA) cream APPLY SMALL AMOUNT TO PORT A CATH SITE AND COVER WITH PLASTIC WRAP ONE HOUR PRIOR TO CHEMOTHERAPY APPOINTMENTS 30 g 0  . oxyCODONE-acetaminophen (PERCOCET) 5-325 MG tablet Take 1 tablet by mouth every 8 (eight) hours as needed (pain). 30 tablet 0  . PACLITAXEL IV Inject into the vein every 21 ( twenty-one) days.    . polyethylene glycol (MIRALAX / GLYCOLAX) 17 g packet   Take 17 g by mouth 2 (two) times daily as needed.     . predniSONE (DELTASONE) 10 MG tablet Take 6 tablets day one, 5 tablets day two, 4 tablets day three, 3 tablets day four, 2 tablets day five, then 1 tablet day six 21 tablet 0  . prochlorperazine (COMPAZINE) 10 MG tablet Take 1 tablet (10 mg total) by mouth every 6 (six) hours as needed (Nausea or vomiting). 30 tablet 1   No current facility-administered medications for this visit.    ALLERGIES:  Allergies  Allergen Reactions  . Carboplatin Other (See Comments)    "body on fire and abdominal pain"  . Nickel Rash    itchy    PHYSICAL EXAM:  Performance status (ECOG): 1 - Symptomatic but completely ambulatory  Vitals:   01/18/20 0829  BP: (!) 142/84  Pulse: 83  Resp: 18  Temp: (!) 96.8 F (36 C)  SpO2: 97%   Wt Readings from Last 3 Encounters:  01/18/20 238 lb 6.4 oz (108.1 kg)  12/28/19 235 lb 7.2 oz (106.8 kg)  12/08/19 235 lb (106.6 kg)   Physical Exam Vitals reviewed.  Constitutional:      Appearance: Normal appearance. She is obese.  Cardiovascular:     Rate and Rhythm: Normal rate and regular rhythm.     Pulses: Normal pulses.     Heart sounds: Normal heart sounds.  Pulmonary:     Effort: Pulmonary effort is normal.     Breath sounds: Normal breath sounds.  Chest:     Comments: Port-a-Cath in R chest Musculoskeletal:     Right lower leg: No edema.     Left lower leg: No edema.  Neurological:     General: No focal deficit present.     Mental Status: She is alert and oriented to person, place, and time.   Psychiatric:        Mood and Affect: Mood normal.        Behavior: Behavior normal.     LABORATORY DATA:  I have reviewed the labs as listed.  CBC Latest Ref Rng & Units 01/18/2020 12/28/2019 12/07/2019  WBC 4.0 - 10.5 K/uL 5.5 6.5 5.4  Hemoglobin 12.0 - 15.0 g/dL 11.3(L) 12.0 11.3(L)  Hematocrit 36.0 - 46.0 % 34.9(L) 37.3 35.0(L)  Platelets 150 - 400 K/uL 279 305 223   CMP Latest Ref Rng & Units 01/18/2020 12/28/2019 12/07/2019  Glucose 70 - 99 mg/dL 110(H) 117(H) 107(H)  BUN 6 - 20 mg/dL 13 16 13  Creatinine 0.44 - 1.00 mg/dL 0.58 0.73 0.60  Sodium 135 - 145 mmol/L 141 138 141  Potassium 3.5 - 5.1 mmol/L 3.8 3.7 3.7  Chloride 98 - 111 mmol/L 108 104 105  CO2 22 - 32 mmol/L 24 23 27  Calcium 8.9 - 10.3 mg/dL 9.0 9.3 9.0  Total Protein 6.5 - 8.1 g/dL 6.7 7.7 6.9  Total Bilirubin 0.3 - 1.2 mg/dL 0.3 0.4 0.4  Alkaline Phos 38 - 126 U/L 91 100 84  AST 15 - 41 U/L 19 23 17  ALT 0 - 44 U/L 24 31 21  No results found for: CA125  DIAGNOSTIC IMAGING:  I have independently reviewed the scans and discussed with the patient. CT Abdomen Pelvis W Contrast  Result Date: 01/10/2020 CLINICAL DATA:  Follow-up recurrent endometrial carcinoma. Ongoing chemotherapy. EXAM: CT ABDOMEN AND PELVIS WITH CONTRAST TECHNIQUE: Multidetector CT imaging of the abdomen and pelvis was performed using the standard protocol following bolus administration of intravenous contrast. CONTRAST:    100mL OMNIPAQUE IOHEXOL 300 MG/ML  SOLN COMPARISON:  10/24/2019 FINDINGS: Lower Chest: No acute findings. Hepatobiliary: No hepatic masses identified. Gallstones are again seen, however there is no evidence of cholecystitis or biliary dilatation. Pancreas:  No mass or inflammatory changes. Spleen: Within normal limits in size and appearance. Adrenals/Urinary Tract: No masses identified. No evidence of ureteral calculi or hydronephrosis. Unremarkable unopacified urinary bladder. Stomach/Bowel: Small hiatal hernia again seen. No  evidence of obstruction, inflammatory process or abnormal fluid collections. Normal appendix visualized. Vascular/Lymphatic: No pathologically enlarged lymph nodes. No abdominal aortic aneurysm. Reproductive: Prior hysterectomy noted. Adnexal regions are unremarkable in appearance. Other: Soft tissue nodule along the left lateral wall of the sigmoid colon measures 1.4 by 1.0 cm on image 169/2, unchanged since previous study. Tiny sub-cm nodules in the central pelvic mesenteric fat on image 166/2 remains stable. Previously seen nodularity in the left omental fat is no longer visualized. No new or enlarging peritoneal nodules or masses identified. No evidence of ascites. Musculoskeletal:  No suspicious bone lesions identified. IMPRESSION: Stable small peritoneal soft tissue nodules in the pelvis, consistent with peritoneal carcinomatosis. No new or progressive disease identified. Resolution of soft tissue nodularity in the left abdominal omental fat. Cholelithiasis. No radiographic evidence of cholecystitis. Small hiatal hernia. Electronically Signed   By: John A Stahl M.D.   On: 01/10/2020 13:04   US BREAST LTD UNI RIGHT INC AXILLA  Result Date: 01/10/2020 CLINICAL DATA:  Short-term follow-up for probably benign right breast mass. EXAM: DIGITAL DIAGNOSTIC UNILATERAL RIGHT MAMMOGRAM WITH TOMO AND CAD; ULTRASOUND RIGHT BREAST LIMITED COMPARISON:  Previous exams. ACR Breast Density Category b: There are scattered areas of fibroglandular density. FINDINGS: No suspicious masses or calcifications are seen in the right breast. The focal asymmetry in the upper-outer posterior right breast consistent with an area of dense fibroglandular tissue appears unchanged. Mammographic images were processed with CAD. Targeted ultrasound of the right breast was performed demonstrating several small areas of fibrocystic change. The oval circumscribed mass in the right breast at 9 o'clock 6 cm from nipple measures 0.5 x 0.3 x 0.5 cm,  unchanged given differences in imaging and measurement technique. IMPRESSION: Stable probably benign right breast mass. No findings of malignancy in the right breast. RECOMMENDATION: Recommend bilateral diagnostic mammography April 2022 which will demonstrate 1 year of stability of the probably benign right breast mass. I have discussed the findings and recommendations with the patient. If applicable, a reminder letter will be sent to the patient regarding the next appointment. BI-RADS CATEGORY  3: Probably benign. Electronically Signed   By: Jennifer  Jarosz M.D.   On: 01/10/2020 11:50   MM DIAG BREAST TOMO UNI RIGHT  Result Date: 01/10/2020 CLINICAL DATA:  Short-term follow-up for probably benign right breast mass. EXAM: DIGITAL DIAGNOSTIC UNILATERAL RIGHT MAMMOGRAM WITH TOMO AND CAD; ULTRASOUND RIGHT BREAST LIMITED COMPARISON:  Previous exams. ACR Breast Density Category b: There are scattered areas of fibroglandular density. FINDINGS: No suspicious masses or calcifications are seen in the right breast. The focal asymmetry in the upper-outer posterior right breast consistent with an area of dense fibroglandular tissue appears unchanged. Mammographic images were processed with CAD. Targeted ultrasound of the right breast was performed demonstrating several small areas of fibrocystic change. The oval circumscribed mass in the right breast at 9 o'clock 6 cm from nipple measures 0.5 x 0.3 x 0.5 cm, unchanged given differences in imaging and measurement technique. IMPRESSION: Stable probably benign right breast mass. No findings of malignancy in the right breast. RECOMMENDATION:   Recommend bilateral diagnostic mammography April 2022 which will demonstrate 1 year of stability of the probably benign right breast mass. I have discussed the findings and recommendations with the patient. If applicable, a reminder letter will be sent to the patient regarding the next appointment. BI-RADS CATEGORY  3: Probably benign.  Electronically Signed   By: Everlean Alstrom M.D.   On: 01/10/2020 11:50     ASSESSMENT:  1. Recurrent endometrial carcinosarcoma: -TAH, BSO, bilateral pelvic and para-aortic lymph node resections on 04/13/2018. -Pathology showed carcinosarcoma (malignant mixed mullerian tumor) is arising in an endometrial type polyp with no myometrial invasion identified. High-grade. 0/39 lymph nodes positive. PT1APN0, FIGO stage Ia. MMR normal. MSI-stable. -CTAP on 12/09/2018 for abdominal pain showed extensive peritoneal carcinomatosis and large soft tissue mass in the pelvis. -Biopsy of the omental mass on 12/28/2018 shows poorly differentiated carcinoma consistent with her prior malignancy. -Carboplatin and paclitaxel started on 12/29/2018. -CT scan on 05/02/2019 showed peritoneal implants in the low central small bowel mesentery and left pelvic sidewall have decreased in size measuring 1.5 x 1.9 cm and 1.7 x 2.2 cm. Soft tissue nodule in the left lateral omentum measures 1 x 1.7 cm with no evidence of metastatic disease. -Continuation of chemotherapy until complete response was recommended. -CT AP on 07/04/2019 showed continued positive response to therapy with scattered peritoneal metastasis in the pelvis and left omentum decreased in size. No new metastatic disease. -CTAP on 10/24/2019 after 14 cycles showed substantial reduction in size of soft tissue nodules in the pelvis. 1.4 x 0.9 x 1 cm soft tissue density in the inferior serosal margin of the sigmoid colon, previously 1.7 x 1.1 x 1.7 cm. 2 small soft tissue nodules in the mesenteric adipose tissue above the urinary bladder measures 0.8 cm. -She has developed serious allergic reaction during cycle 14 with carboplatin. -I have discussed with Dr. Denman George.  Other options include adding ifosfamide to Taxol or Doxil and combination of lenvatinib with pembrolizumab.  In the lenvatinib pembrolizumab trial carcinosarcomas were not included. -Single agent  paclitaxel started on 11/09/2019, dose reduced by 20% on 01/18/2020. -CTAP on 01/10/2020 with stable small peritoneal soft tissue nodules in the pelvis.  Resolution of soft tissue nodularity in the left abdominal omental fat with no new or progressive disease.  2. Breast abnormality: -Mammogram on 05/02/2019 shows BI-RADS Category 0. She reportedly took Covid shot 1 to 2 weeks prior to the mammogram. Further scans rescheduled on 06/21/2019.  3. Peripheral neuropathy: -She reported numbness and occasional pins-and-needles sensation in the bilateral toes, right more than left. This has started about a week ago.   PLAN:  1. Recurrent endometrial carcinosarcoma: -We reviewed results of the CT AP which showed resolution of soft tissue nodularity in the left abdominal omental fat. -She reported worsening of neuropathy in the feet. -We will cut back on dose of paclitaxel by 20%. -Reevaluate in 3 weeks.  2.Low back pain/right-sided abdominal pain: -She has on and off low back pain from sciatica.  She is using Percocet as needed.  3. Hypertension: -Continue Norvasc 5 mg daily.  4. Neuropathy: -Continue gabapentin 300 mg twice daily.  She could not take 3 times a day because of tiredness.  5.  Abnormal mammogram: -She had repeat ultrasound and mammogram on 01/10/2020 which was BI-RADS Category 3.  Recommend 1 year follow-up.   Orders placed this encounter:  No orders of the defined types were placed in this encounter.    [name], MD [place] [phone]  I, Daniel Khashchuk, am acting as a scribe for Dr. Sreedhar Katagadda.  I, Sreedhar Katragadda MD, have reviewed the above documentation for accuracy and completeness, and I agree with the above.     

## 2020-01-18 NOTE — Patient Instructions (Signed)
[facility] Discharge Instructions  You were seen today by Dr. Katragadda. He went over your recent results. You received your treatment today. Dr. Katragadda will see you back in 3 weeks for labs and follow up.   Thank you for choosing [facility] at [facility] to provide your oncology and hematology care.  To afford each patient quality time with our provider, please arrive at least 15 minutes before your scheduled appointment time.   If you have a lab appointment with the Cancer Center please come in thru the Main Entrance and check in at the main information desk  You need to re-schedule your appointment should you arrive 10 or more minutes late.  We strive to give you quality time with our providers, and arriving late affects you and other patients whose appointments are after yours.  Also, if you no show three or more times for appointments you may be dismissed from the clinic at the providers discretion.     Again, thank you for choosing [facility].  Our hope is that these requests will decrease the amount of time that you wait before being seen by our physicians.       _____________________________________________________________  Should you have questions after your visit to [facility], please contact our office at [phone] between the hours of 8:00 a.m. and 4:30 p.m.  Voicemails left after 4:00 p.m. will not be returned until the following business day.  For prescription refill requests, have your pharmacy contact our office and allow 72 hours.    Cancer Center Support Programs:   > Cancer Support Group  2nd Tuesday of the month 1pm-2pm, Journey Room    

## 2020-01-18 NOTE — Progress Notes (Signed)
4818 Labs reviewed and pt seen by Dr. Ellin Saba and pt approved for Taxol infusion today per MD                                       Jessica [name] tolerated Taxol infusion well without complaints or incident. VSS upon discharge. Pt discharged self ambulatory in satisfactory condition

## 2020-01-19 LAB — CA 125: Cancer Antigen (CA) 125: 5.7 U/mL (ref 0.0–38.1)

## 2020-01-29 ENCOUNTER — Encounter: Payer: Self-pay | Admitting: Emergency Medicine

## 2020-01-29 ENCOUNTER — Other Ambulatory Visit: Payer: Self-pay

## 2020-01-29 ENCOUNTER — Ambulatory Visit
Admission: EM | Admit: 2020-01-29 | Discharge: 2020-01-29 | Disposition: A | Discharge status: Home / Self Care | Payer: Medicaid Other | Attending: Family Medicine | Admitting: Family Medicine

## 2020-01-29 DIAGNOSIS — Z9189 Other specified personal risk factors, not elsewhere classified: Secondary | ICD-10-CM

## 2020-01-29 NOTE — ED Notes (Signed)
Nurse visit only patient states she feels she does not need to see the provider.  Just wants to be covid tested.

## 2020-01-29 NOTE — ED Triage Notes (Signed)
Daughter and granddaughter are covid positive. C/o headache, cough productive cough and congestion

## 2020-02-01 LAB — COVID-19, FLU A+B NAA
Influenza A, NAA: NOT DETECTED
Influenza B, NAA: NOT DETECTED
SARS-CoV-2, NAA: DETECTED — AB

## 2020-02-08 ENCOUNTER — Ambulatory Visit (HOSPITAL_COMMUNITY): Payer: Medicaid Other | Admitting: Hematology

## 2020-02-08 ENCOUNTER — Ambulatory Visit (HOSPITAL_COMMUNITY): Payer: Medicaid Other

## 2020-02-08 ENCOUNTER — Other Ambulatory Visit (HOSPITAL_COMMUNITY): Payer: Medicaid Other

## 2020-02-22 ENCOUNTER — Inpatient Hospital Stay (HOSPITAL_COMMUNITY): Payer: Medicaid Other | Attending: Hematology

## 2020-02-22 ENCOUNTER — Inpatient Hospital Stay (HOSPITAL_BASED_OUTPATIENT_CLINIC_OR_DEPARTMENT_OTHER): Payer: Medicaid Other | Admitting: Hematology

## 2020-02-22 ENCOUNTER — Encounter (HOSPITAL_COMMUNITY): Payer: Self-pay | Admitting: Lab

## 2020-02-22 ENCOUNTER — Inpatient Hospital Stay (HOSPITAL_COMMUNITY): Payer: Medicaid Other

## 2020-02-22 VITALS — BP 143/67 | HR 71 | Temp 97.1°F | Resp 18 | Wt 239.4 lb

## 2020-02-22 VITALS — BP 119/64 | HR 71 | Temp 97.5°F | Resp 18

## 2020-02-22 DIAGNOSIS — Z79899 Other long term (current) drug therapy: Secondary | ICD-10-CM | POA: Insufficient documentation

## 2020-02-22 DIAGNOSIS — Z801 Family history of malignant neoplasm of trachea, bronchus and lung: Secondary | ICD-10-CM | POA: Diagnosis not present

## 2020-02-22 DIAGNOSIS — Z9079 Acquired absence of other genital organ(s): Secondary | ICD-10-CM | POA: Insufficient documentation

## 2020-02-22 DIAGNOSIS — I10 Essential (primary) hypertension: Secondary | ICD-10-CM | POA: Diagnosis not present

## 2020-02-22 DIAGNOSIS — E785 Hyperlipidemia, unspecified: Secondary | ICD-10-CM | POA: Diagnosis not present

## 2020-02-22 DIAGNOSIS — Z5111 Encounter for antineoplastic chemotherapy: Secondary | ICD-10-CM | POA: Diagnosis not present

## 2020-02-22 DIAGNOSIS — R928 Other abnormal and inconclusive findings on diagnostic imaging of breast: Secondary | ICD-10-CM | POA: Insufficient documentation

## 2020-02-22 DIAGNOSIS — C541 Malignant neoplasm of endometrium: Secondary | ICD-10-CM | POA: Diagnosis not present

## 2020-02-22 DIAGNOSIS — Z8041 Family history of malignant neoplasm of ovary: Secondary | ICD-10-CM | POA: Diagnosis not present

## 2020-02-22 DIAGNOSIS — Z8616 Personal history of COVID-19: Secondary | ICD-10-CM | POA: Diagnosis not present

## 2020-02-22 DIAGNOSIS — Z90710 Acquired absence of both cervix and uterus: Secondary | ICD-10-CM | POA: Diagnosis not present

## 2020-02-22 DIAGNOSIS — C786 Secondary malignant neoplasm of retroperitoneum and peritoneum: Secondary | ICD-10-CM | POA: Insufficient documentation

## 2020-02-22 DIAGNOSIS — Z8249 Family history of ischemic heart disease and other diseases of the circulatory system: Secondary | ICD-10-CM | POA: Insufficient documentation

## 2020-02-22 DIAGNOSIS — G629 Polyneuropathy, unspecified: Secondary | ICD-10-CM | POA: Insufficient documentation

## 2020-02-22 DIAGNOSIS — Z90722 Acquired absence of ovaries, bilateral: Secondary | ICD-10-CM | POA: Insufficient documentation

## 2020-02-22 DIAGNOSIS — Z95828 Presence of other vascular implants and grafts: Secondary | ICD-10-CM

## 2020-02-22 LAB — COMPREHENSIVE METABOLIC PANEL
ALT: 35 U/L (ref 0–44)
AST: 25 U/L (ref 15–41)
Albumin: 3.9 g/dL (ref 3.5–5.0)
Alkaline Phosphatase: 98 U/L (ref 38–126)
Anion gap: 8 (ref 5–15)
BUN: 16 mg/dL (ref 6–20)
CO2: 25 mmol/L (ref 22–32)
Calcium: 9 mg/dL (ref 8.9–10.3)
Chloride: 106 mmol/L (ref 98–111)
Creatinine, Ser: 0.57 mg/dL (ref 0.44–1.00)
GFR, Estimated: >60 mL/min (ref >60)
Glucose, Bld: 116 mg/dL — ABNORMAL HIGH (ref 70–99)
Potassium: 3.6 mmol/L (ref 3.5–5.1)
Sodium: 139 mmol/L (ref 135–145)
Total Bilirubin: 0.5 mg/dL (ref 0.3–1.2)
Total Protein: 7.1 g/dL (ref 6.5–8.1)

## 2020-02-22 LAB — CBC WITH DIFFERENTIAL/PLATELET
Abs Immature Granulocytes: 0.01 10*3/uL (ref 0.00–0.07)
Basophils Absolute: 0 10*3/uL (ref 0.0–0.1)
Basophils Relative: 1 %
Eosinophils Absolute: 0.1 10*3/uL (ref 0.0–0.5)
Eosinophils Relative: 2 %
HCT: 36.8 % (ref 36.0–46.0)
Hemoglobin: 11.8 g/dL — ABNORMAL LOW (ref 12.0–15.0)
Immature Granulocytes: 0 %
Lymphocytes Relative: 35 %
Lymphs Abs: 2.4 10*3/uL (ref 0.7–4.0)
MCH: 29.1 pg (ref 26.0–34.0)
MCHC: 32.1 g/dL (ref 30.0–36.0)
MCV: 90.6 fL (ref 80.0–100.0)
Monocytes Absolute: 0.6 10*3/uL (ref 0.1–1.0)
Monocytes Relative: 8 %
Neutro Abs: 3.8 10*3/uL (ref 1.7–7.7)
Neutrophils Relative %: 54 %
Platelets: 277 10*3/uL (ref 150–400)
RBC: 4.06 MIL/uL (ref 3.87–5.11)
RDW: 14.9 % (ref 11.5–15.5)
WBC: 7 10*3/uL (ref 4.0–10.5)
nRBC: 0 % (ref 0.0–0.2)

## 2020-02-22 MED ORDER — SODIUM CHLORIDE 0.9 % IV SOLN: Freq: Once | INTRAVENOUS | Status: AC

## 2020-02-22 MED ORDER — SODIUM CHLORIDE 0.9 % IV SOLN
140.0000 mg/m2 | Freq: Once | INTRAVENOUS | Status: AC
  Administered 2020-02-22: 288 mg via INTRAVENOUS
  Filled 2020-02-22: qty 48

## 2020-02-22 MED ORDER — OXYCODONE-ACETAMINOPHEN 5-325 MG PO TABS: 1.0000 | ORAL_TABLET | Freq: Three times a day (TID) | ORAL | 0 refills | Status: DC | PRN

## 2020-02-22 MED ORDER — SODIUM CHLORIDE 0.9% FLUSH
10.0000 mL | INTRAVENOUS | Status: DC | PRN
  Administered 2020-02-22: 10 mL

## 2020-02-22 MED ORDER — FAMOTIDINE IN NACL 20-0.9 MG/50ML-% IV SOLN
INTRAVENOUS | Status: AC
  Filled 2020-02-22: qty 50

## 2020-02-22 MED ORDER — SODIUM CHLORIDE 0.9 % IV SOLN
10.0000 mg | Freq: Once | INTRAVENOUS | Status: AC
  Administered 2020-02-22: 10 mg via INTRAVENOUS
  Filled 2020-02-22: qty 1

## 2020-02-22 MED ORDER — DIPHENHYDRAMINE HCL 50 MG/ML IJ SOLN
INTRAMUSCULAR | Status: AC
  Filled 2020-02-22: qty 1

## 2020-02-22 MED ORDER — PALONOSETRON HCL INJECTION 0.25 MG/5ML
0.2500 mg | Freq: Once | INTRAVENOUS | Status: AC
  Administered 2020-02-22: 0.25 mg via INTRAVENOUS

## 2020-02-22 MED ORDER — DIPHENHYDRAMINE HCL 50 MG/ML IJ SOLN
50.0000 mg | Freq: Once | INTRAMUSCULAR | Status: AC
  Administered 2020-02-22: 50 mg via INTRAVENOUS

## 2020-02-22 MED ORDER — PALONOSETRON HCL INJECTION 0.25 MG/5ML
INTRAVENOUS | Status: AC
  Filled 2020-02-22: qty 5

## 2020-02-22 MED ORDER — FAMOTIDINE IN NACL 20-0.9 MG/50ML-% IV SOLN
20.0000 mg | Freq: Once | INTRAVENOUS | Status: AC
  Administered 2020-02-22: 20 mg via INTRAVENOUS

## 2020-02-22 MED ORDER — HEPARIN SOD (PORK) LOCK FLUSH 100 UNIT/ML IV SOLN
500.0000 [IU] | Freq: Once | INTRAVENOUS | Status: AC | PRN
  Administered 2020-02-22: 500 [IU]

## 2020-02-22 NOTE — Progress Notes (Signed)
Confirmed dose of taxol today at 140 mg/m2 per MD.  Dose reduction was made with last cycle.  T.O. Dr Alphonzo Severance Aurora Mask, PharmD

## 2020-02-22 NOTE — Progress Notes (Signed)
Jessica [name], Jessica [name] [id number]   CLINIC:  Medical Oncology/Hematology  PCP:  Perlie Mayo, NP [address] / [place] [address] [phone]   REASON FOR VISIT:  Follow-up for recurrent endometrial cancer  PRIOR THERAPY: TAH & BSO, bilateral pelvic and para-aortic lymph nodes dissections on 04/13/2018  NGS Results: Not done  CURRENT THERAPY: Paclitaxel & Aloxi every 3 weeks  BRIEF ONCOLOGIC HISTORY:  Oncology History  Endometrial cancer ([place])  03/31/2018 Initial Diagnosis   Endometrial cancer ([place])   12/29/2018 -  Chemotherapy    Patient is on Treatment Plan: UTERINE CARBOPLATIN AUC 6 / PACLITAXEL Q21D        CANCER STAGING: Cancer Staging No matching staging information was found for the patient.  INTERVAL HISTORY:  Jessica [name], a 60 y.o. female, returns for routine follow-up and consideration for next cycle of chemotherapy. Jessica [name] was last seen on 01/18/2020.  Due for cycle #19 of paclitaxel and Aloxi today.   Overall, she tells me she has been feeling pretty well. She complains of having aching pain in her right hip extending down her entire leg to the ankle, which is intermittent, severe and occurring 5 times per week; she denies any correlation with chemo. The pain is aggravated by certain movements in certain positions, though she continues doing exercises. She takes Percocet as needed which helps take the edge off, but does take it completely away. The numbness and aching in her bilat feet is constant and sometimes keeps her awake at night; she is taking gabapentin but denies that it is helping. She reports having nausea and diarrhea for 2 days following treatment. Her appetite is excellent.  She was diagnosed with COVID on New Year's and reports that it was more like a common cold.  Overall, she feels ready for next cycle of chemo today.    REVIEW OF SYSTEMS:  Review of Systems  Constitutional: Positive for  fatigue (90%). Negative for appetite change.  Gastrointestinal: Positive for diarrhea (x2 days after Tx) and nausea (x2 days after Tx).  Musculoskeletal: Positive for arthralgias (7/10 R hip & leg pain) and myalgias (aching pain in R leg from hip to ankle).  All other systems reviewed and are negative.   PAST MEDICAL/SURGICAL HISTORY:  Past Medical History:  Diagnosis Date  . Cancer ([place])    Phreesia 09/05/2019  . Elevated blood pressure reading   . Endometrial cancer ([place])   . Hyperlipidemia   . Hypertension   . PMB (postmenopausal bleeding)   . Port-A-Cath in place 12/24/2018   Past Surgical History:  Procedure Laterality Date  . ABDOMINAL HYSTERECTOMY N/A    Phreesia 09/05/2019  . IR IMAGING GUIDED PORT INSERTION  12/28/2018  . IR RADIOLOGIST EVAL & MGMT  06/02/2018  . IR RADIOLOGIST EVAL & MGMT  06/16/2018  . NECK SURGERY  2000   spinal surgery , titanium rod in place   . ROBOTIC ASSISTED TOTAL HYSTERECTOMY WITH BILATERAL SALPINGO OOPHERECTOMY N/A 04/13/2018   Procedure: XI ROBOTIC ASSISTED TOTAL HYSTERECTOMY WITH BILATERAL SALPINGO OOPHORECTOMY;  Surgeon: Everitt Amber, MD;  Location: [facility];  Service: Gynecology;  Laterality: N/A;  . ROBOTIC PELVIC AND PARA-AORTIC LYMPH NODE DISSECTION N/A 04/13/2018   Procedure: XI ROBOTIC PELVIC LYMPHADECTOMY AND PARA-AORTIC LYMPH NODE DISSECTION;  Surgeon: Everitt Amber, MD;  Location: [facility];  Service: Gynecology;  Laterality: N/A;    SOCIAL HISTORY:  Social History   Socioeconomic History  . Marital status: Widowed  Spouse name: Not on file  . Number of children: 3  . Years of education: Not on file  . Highest education level: Not on file  Occupational History  . 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 Smoking status: Never Smoker  . Smokeless tobacco: Never Used  Vaping Use  . Vaping Use: Never used  Substance and Sexual Activity  . Alcohol use: Yes    Alcohol/week: 4.0 standard drinks    Types: 4 Glasses of  wine per week    Comment: weekends  . Drug use: Never  . Sexual activity: Not Currently  Other Topics Concern  . Not on file  Social History Narrative   Lives with daughter and granddaughter here in [place], moved 3 years ago      Cats: Zoe-20 years old, Eduard Clos, and Horald Pollen      Enjoys: walking when she can tolerate- spending time with granddaughter (her life)      Diet: eats all food groups   Caffeine: 1-2 cups at times, but does not tolerate it all the time   Water: 160 oz daily      Wears seat belt    Does not use phone while driving    Oceanographer at home   Fire extinguisher-no   No weapons   Social Determinants of Health   Financial Resource Strain: [place]   . Difficulty of Paying Living Expenses: Not hard at all  Food Insecurity: No Food Insecurity  . Worried About Charity fundraiser in the Last Year: Never true  . Ran Out of Food in the Last Year: Never true  Transportation Needs: No Transportation Needs  . Lack of Transportation (Medical): No  . Lack of Transportation (Non-Medical): No  Physical Activity: Inactive  . Days of Exercise per Week: 0 days  . Minutes of Exercise per Session: 0 min  Stress: Stress Concern Present  . Feeling of Stress : Rather much  Social Connections: Socially Isolated  . Frequency of Communication with Friends and Family: More than three times a week  . Frequency of Social Gatherings with Friends and Family: More than three times a week  . Attends Religious Services: Never  . Active Member of Clubs or Organizations: No  . Attends Archivist Meetings: Never  . Marital Status: Widowed  Intimate Partner Violence: Not At Risk  . Fear of Current or Ex-Partner: No  . Emotionally Abused: No  . Physically Abused: No  . Sexually Abused: No    FAMILY HISTORY:  Family History  Problem Relation Age of Onset  . Hypertension Father   . Heart disease Father   . Cancer Father        lung  . Hypertension Mother   .  Cancer Sister        ovarian cancer    CURRENT MEDICATIONS:  Current Outpatient Medications  Medication Sig Dispense Refill  . amLODipine (NORVASC) 5 MG tablet Take 1 tablet (5 mg total) by mouth daily. 90 tablet 1  . atorvastatin (LIPITOR) 20 MG tablet Take 1 tablet (20 mg total) by mouth daily. 90 tablet 1  . CONSTULOSE 10 GM/15ML solution Take 30 g by mouth daily as needed.     . dicyclomine (BENTYL) 20 MG tablet Take 1 tablet (20 mg total) by mouth every 6 (six) hours. 30 tablet 1  . docusate sodium (COLACE) 100 MG capsule Take 200 mg by mouth 2 (two) times daily as needed  for mild constipation (when taking pain medication).    Marland Kitchen EPINEPHrine 0.3 mg/0.3 mL IJ SOAJ injection Inject 0.3 mg into the muscle as needed for anaphylaxis. 1 each 0  . gabapentin (NEURONTIN) 300 MG capsule TAKE 1 CAPSULE BY MOUTH THREE TIMES DAILY 90 capsule 6  . ibuprofen (ADVIL) 200 MG tablet Take 200 mg by mouth every 6 (six) hours as needed.     . lidocaine-prilocaine (EMLA) cream APPLY SMALL AMOUNT TO PORT A CATH SITE AND COVER WITH PLASTIC WRAP ONE HOUR PRIOR TO CHEMOTHERAPY APPOINTMENTS 30 g 0  . oxyCODONE-acetaminophen (PERCOCET) 5-325 MG tablet Take 1 tablet by mouth every 8 (eight) hours as needed (pain). 30 tablet 0  . PACLITAXEL IV Inject into the vein every 21 ( twenty-one) days.    . polyethylene glycol (MIRALAX / GLYCOLAX) 17 g packet Take 17 g by mouth 2 (two) times daily as needed.     . predniSONE (DELTASONE) 10 MG tablet Take 6 tablets day one, 5 tablets day two, 4 tablets day three, 3 tablets day four, 2 tablets day five, then 1 tablet day six 21 tablet 0  . prochlorperazine (COMPAZINE) 10 MG tablet Take 1 tablet (10 mg total) by mouth every 6 (six) hours as needed (Nausea or vomiting). 30 tablet 1   No current facility-administered medications for this visit.    ALLERGIES:  Allergies  Allergen Reactions  . Carboplatin Other (See Comments)    "body on fire and abdominal pain"  . Nickel Rash     itchy    PHYSICAL EXAM:  Performance status (ECOG): 1 - Symptomatic but completely ambulatory  Vitals:   02/22/20 0810  BP: (!) 143/67  Pulse: 71  Resp: 18  Temp: (!) 97.1 F (36.2 C)  SpO2: 97%   Wt Readings from Last 3 Encounters:  02/22/20 239 lb 6.4 oz (108.6 kg)  01/29/20 235 lb (106.6 kg)  01/18/20 238 lb 6.4 oz (108.1 kg)   Physical Exam Vitals reviewed.  Constitutional:      Appearance: Normal appearance. She is obese.  Cardiovascular:     Rate and Rhythm: Normal rate and regular rhythm.     Pulses: Normal pulses.     Heart sounds: Normal heart sounds.  Pulmonary:     Effort: Pulmonary effort is normal.     Breath sounds: Normal breath sounds.  Chest:     Comments: Port-a-Cath in R chest Abdominal:     Tenderness: There is no right CVA tenderness or left CVA tenderness.  Musculoskeletal:     Lumbar back: No tenderness or bony tenderness.     Right upper leg: Tenderness (TTP in lower half of lateral thigh) present. No swelling or edema.     Right lower leg: No edema.     Left lower leg: No edema.  Neurological:     General: No focal deficit present.     Mental Status: She is alert and oriented to person, place, and time.  Psychiatric:        Mood and Affect: Mood normal.        Behavior: Behavior normal.     LABORATORY DATA:  I have reviewed the labs as listed.  CBC Latest Ref Rng & Units 02/22/2020 01/18/2020 12/28/2019  WBC 4.0 - 10.5 K/uL 7.0 5.5 6.5  Hemoglobin 12.0 - 15.0 g/dL 11.8(L) 11.3(L) 12.0  Hematocrit 36.0 - 46.0 % 36.8 34.9(L) 37.3  Platelets 150 - 400 K/uL 277 279 305   CMP Latest Ref Rng & Units  02/22/2020 01/18/2020 12/28/2019  Glucose 70 - 99 mg/dL 116(H) 110(H) 117(H)  BUN 6 - 20 mg/dL _0 Creatinine 0.44 - 1.00 mg/dL 0.57 0.58 0.73  Sodium 135 - 145 mmol/L 139 141 138  Potassium 3.5 - 5.1 mmol/L 3.6 3.8 3.7  Chloride 98 - 111 mmol/L 106 108 104  CO2 22 - 32 mmol/L _1 Calcium 8.9 - 10.3 mg/dL 9.0 9.0 9.3  Total  Protein 6.5 - 8.1 g/dL 7.1 6.7 7.7  Total Bilirubin 0.3 - 1.2 mg/dL 0.5 0.3 0.4  Alkaline Phos 38 - 126 U/L 98 91 100  AST 15 - 41 U/L _2 ALT 0 - 44 U/L 35 24 31    DIAGNOSTIC IMAGING:  I have independently reviewed the scans and discussed with the patient. No results found.   ASSESSMENT:  1. Recurrent endometrial carcinosarcoma: -TAH, BSO, bilateral pelvic and para-aortic lymph node resections on 04/13/2018. -Pathology showed carcinosarcoma (malignant mixed mullerian tumor) is arising in an endometrial type polyp with no myometrial invasion identified. High-grade. 0/39 lymph nodes positive. PT1APN0, FIGO stage Ia. MMR normal. MSI-stable. -CTAP on 12/09/2018 for abdominal pain showed extensive peritoneal carcinomatosis and large soft tissue mass in the pelvis. -Biopsy of the omental mass on 12/28/2018 shows poorly differentiated carcinoma consistent with her prior malignancy. -Carboplatin and paclitaxel started on 12/29/2018. -CT scan on 05/02/2019 showed peritoneal implants in the low central small bowel mesentery and left pelvic sidewall have decreased in size measuring 1.5 x 1.9 cm and 1.7 x 2.2 cm. Soft tissue nodule in the left lateral omentum measures 1 x 1.7 cm with no evidence of metastatic disease. -Continuation of chemotherapy until complete response was recommended. -CT AP on 07/04/2019 showed continued positive response to therapy with scattered peritoneal metastasis in the pelvis and left omentum decreased in size. No new metastatic disease. -CTAP on 10/24/2019 after 14 cycles showed substantial reduction in size of soft tissue nodules in the pelvis. 1.4 x 0.9 x 1 cm soft tissue density in the inferior serosal margin of the sigmoid colon, previously 1.7 x 1.1 x 1.7 cm. 2 small soft tissue nodules in the mesenteric adipose tissue above the urinary bladder measures 0.8 cm. -She has developed serious allergic reaction during cycle 14 with carboplatin. -I have discussed  with Dr. Denman George. Other options include adding ifosfamide to Taxol or Doxil and combination of lenvatinib with pembrolizumab. In the lenvatinib pembrolizumab trial carcinosarcomas were not included. -Single agent paclitaxel started on 11/09/2019, dose reduced by 20% on 01/18/2020. -CTAP on 01/10/2020 with stable small peritoneal soft tissue nodules in the pelvis.  Resolution of soft tissue nodularity in the left abdominal omental fat with no new or progressive disease.  2. Breast abnormality: -Mammogram on 05/02/2019 shows BI-RADS Category 0. She reportedly took Covid shot 1 to 2 weeks prior to the mammogram. Further scans rescheduled on 06/21/2019.  3. Peripheral neuropathy: -She reported numbness and occasional pins-and-needles sensation in the bilateral toes, right more than left. This has started about a week ago.   PLAN:  1. Recurrent endometrial carcinosarcoma: -She missed her previous appointment-she was tested positive for Covid on 01/29/2020.  She did not have any major symptoms. -Reviewed her labs today which showed normal LFTs and CBC. -We will proceed with next cycle of paclitaxel at 140 mg/metered square. -RTC 3 weeks for follow-up.  2.Low back pain/right-sided abdominal pain: -She reported slight worsening of the right leg pain, on and off, feels like toothache.  No loss of  bowel or bladder function. -Recommend MRI of the lumbar spine and right hip. -Continue Percocet 5/325 as needed.  I will send a refill.  3. Hypertension: -Continue Norvasc 5 mg daily.  4. Neuropathy: -She has numbness in both feet, right more than left.  At times it is achy.  She is taking gabapentin twice daily.  5. Abnormal mammogram: -Repeat ultrasound and mammogram on 01/10/2020 was BI-RADS Category 3.  Recommend 1 year follow-up.   Orders placed this encounter:  No orders of the defined types were placed in this encounter.    [name], MD [place] [phone]   I, Milinda Antis, am acting as a scribe for Dr. Sanda Linger.  I, [name] MD, have reviewed the above documentation for accuracy and completeness, and I agree with the above.

## 2020-02-22 NOTE — Patient Instructions (Signed)
[facility] Discharge Instructions for Patients Receiving Chemotherapy  Today you received the following chemotherapy agents   To help prevent nausea and vomiting after your treatment, we encourage you to take your nausea medication   If you develop nausea and vomiting that is not controlled by your nausea medication, call the clinic.   BELOW ARE SYMPTOMS THAT SHOULD BE REPORTED IMMEDIATELY:  *FEVER GREATER THAN 100.5 F  *CHILLS WITH OR WITHOUT FEVER  NAUSEA AND VOMITING THAT IS NOT CONTROLLED WITH YOUR NAUSEA MEDICATION  *UNUSUAL SHORTNESS OF BREATH  *UNUSUAL BRUISING OR BLEEDING  TENDERNESS IN MOUTH AND THROAT WITH OR WITHOUT PRESENCE OF ULCERS  *URINARY PROBLEMS  *BOWEL PROBLEMS  UNUSUAL RASH Items with * indicate a potential emergency and should be followed up as soon as possible.  Feel free to call the clinic should you have any questions or concerns. The clinic phone number is [phone].  Please show the CHEMO ALERT CARD at check-in to the Emergency Department and triage nurse.   

## 2020-02-22 NOTE — Progress Notes (Signed)
Patient was assessed by Dr. Delton Coombes and labs have been reviewed.  Patient is okay to proceed with treatment today paclitaxol dose cut by 20%. Primary RN and pharmacy aware.

## 2020-02-22 NOTE — Progress Notes (Unsigned)
Referral to [facility] rad Onc.  Records faxed on 2/2

## 2020-02-22 NOTE — Patient Instructions (Signed)
[place] at [facility] Discharge Instructions  You were seen today by Dr. Delton Coombes. He went over your recent results. You received your treatment today. You will be scheduled for an MRI of your lumbar spine and right hip to determine the cause of your pain. Dr. Delton Coombes will see you back in 3 weeks for labs and follow up.   Thank you for choosing [place] at [facility] to provide your oncology and hematology care.  To afford each patient quality time with our provider, please arrive at least 15 minutes before your scheduled appointment time.   If you have a lab appointment with the [facility] please come in thru the Main Entrance and check in at the main information desk  You need to re-schedule your appointment should you arrive 10 or more minutes late.  We strive to give you quality time with our providers, and arriving late affects you and other patients whose appointments are after yours.  Also, if you no show three or more times for appointments you may be dismissed from the clinic at the providers discretion.     Again, thank you for choosing [facility].  Our hope is that these requests will decrease the amount of time that you wait before being seen by our physicians.       _____________________________________________________________  Should you have questions after your visit to [facility], please contact our office at [phone] between the hours of 8:00 a.m. and 4:30 p.m.  Voicemails left after 4:00 p.m. will not be returned until the following business day.  For prescription refill requests, have your pharmacy contact our office and allow 72 hours.    Cancer Center Support Programs:   > Cancer Support Group  2nd Tuesday of the month 1pm-2pm, Journey Room

## 2020-02-22 NOTE — Progress Notes (Signed)
Pt here for D1C19 of taxol today. Pt good for treatment.   Tolerated treatment well today without incidence.  Discharged in stable condition ambulatory.  Vital signs stable prior to discharge.

## 2020-03-09 ENCOUNTER — Ambulatory Visit (HOSPITAL_COMMUNITY)
Admission: RE | Admit: 2020-03-09 | Discharge: 2020-03-09 | Disposition: A | Discharge status: Home / Self Care | Payer: Medicaid Other | Source: Ambulatory Visit | Attending: Hematology | Admitting: Hematology

## 2020-03-09 ENCOUNTER — Other Ambulatory Visit: Payer: Self-pay

## 2020-03-09 DIAGNOSIS — C541 Malignant neoplasm of endometrium: Secondary | ICD-10-CM | POA: Insufficient documentation

## 2020-03-14 ENCOUNTER — Ambulatory Visit (HOSPITAL_COMMUNITY): Payer: Medicaid Other | Admitting: Hematology

## 2020-03-14 ENCOUNTER — Inpatient Hospital Stay (HOSPITAL_COMMUNITY): Payer: Medicaid Other

## 2020-03-14 ENCOUNTER — Other Ambulatory Visit (HOSPITAL_COMMUNITY): Payer: Medicaid Other

## 2020-03-14 ENCOUNTER — Ambulatory Visit (HOSPITAL_COMMUNITY): Payer: Medicaid Other

## 2020-03-14 ENCOUNTER — Inpatient Hospital Stay (HOSPITAL_BASED_OUTPATIENT_CLINIC_OR_DEPARTMENT_OTHER): Payer: Medicaid Other | Admitting: Hematology

## 2020-03-14 ENCOUNTER — Other Ambulatory Visit: Payer: Self-pay

## 2020-03-14 VITALS — BP 129/77 | HR 65 | Temp 97.1°F | Resp 18

## 2020-03-14 VITALS — BP 151/74 | HR 68 | Temp 96.8°F | Resp 18 | Wt 240.6 lb

## 2020-03-14 DIAGNOSIS — C541 Malignant neoplasm of endometrium: Secondary | ICD-10-CM

## 2020-03-14 DIAGNOSIS — Z95828 Presence of other vascular implants and grafts: Secondary | ICD-10-CM

## 2020-03-14 DIAGNOSIS — Z5111 Encounter for antineoplastic chemotherapy: Secondary | ICD-10-CM | POA: Diagnosis not present

## 2020-03-14 LAB — COMPREHENSIVE METABOLIC PANEL
ALT: 29 U/L (ref 0–44)
AST: 22 U/L (ref 15–41)
Albumin: 3.9 g/dL (ref 3.5–5.0)
Alkaline Phosphatase: 95 U/L (ref 38–126)
Anion gap: 9 (ref 5–15)
BUN: 16 mg/dL (ref 6–20)
CO2: 24 mmol/L (ref 22–32)
Calcium: 9.3 mg/dL (ref 8.9–10.3)
Chloride: 106 mmol/L (ref 98–111)
Creatinine, Ser: 0.62 mg/dL (ref 0.44–1.00)
GFR, Estimated: >60 mL/min (ref >60)
Glucose, Bld: 121 mg/dL — ABNORMAL HIGH (ref 70–99)
Potassium: 3.7 mmol/L (ref 3.5–5.1)
Sodium: 139 mmol/L (ref 135–145)
Total Bilirubin: 0.6 mg/dL (ref 0.3–1.2)
Total Protein: 6.9 g/dL (ref 6.5–8.1)

## 2020-03-14 LAB — CBC WITH DIFFERENTIAL/PLATELET
Abs Immature Granulocytes: 0.02 10*3/uL (ref 0.00–0.07)
Basophils Absolute: 0 10*3/uL (ref 0.0–0.1)
Basophils Relative: 1 %
Eosinophils Absolute: 0.1 10*3/uL (ref 0.0–0.5)
Eosinophils Relative: 2 %
HCT: 34.8 % — ABNORMAL LOW (ref 36.0–46.0)
Hemoglobin: 11.3 g/dL — ABNORMAL LOW (ref 12.0–15.0)
Immature Granulocytes: 0 %
Lymphocytes Relative: 36 %
Lymphs Abs: 2.5 10*3/uL (ref 0.7–4.0)
MCH: 29.3 pg (ref 26.0–34.0)
MCHC: 32.5 g/dL (ref 30.0–36.0)
MCV: 90.2 fL (ref 80.0–100.0)
Monocytes Absolute: 0.7 10*3/uL (ref 0.1–1.0)
Monocytes Relative: 9 %
Neutro Abs: 3.6 10*3/uL (ref 1.7–7.7)
Neutrophils Relative %: 52 %
Platelets: 268 10*3/uL (ref 150–400)
RBC: 3.86 MIL/uL — ABNORMAL LOW (ref 3.87–5.11)
RDW: 16 % — ABNORMAL HIGH (ref 11.5–15.5)
WBC: 7 10*3/uL (ref 4.0–10.5)
nRBC: 0 % (ref 0.0–0.2)

## 2020-03-14 LAB — MAGNESIUM: Magnesium: 1.6 mg/dL — ABNORMAL LOW (ref 1.7–2.4)

## 2020-03-14 MED ORDER — HEPARIN SOD (PORK) LOCK FLUSH 100 UNIT/ML IV SOLN
500.0000 [IU] | Freq: Once | INTRAVENOUS | Status: AC | PRN
  Administered 2020-03-14: 500 [IU]

## 2020-03-14 MED ORDER — SODIUM CHLORIDE 0.9 % IV SOLN: Freq: Once | INTRAVENOUS | Status: AC

## 2020-03-14 MED ORDER — FAMOTIDINE IN NACL 20-0.9 MG/50ML-% IV SOLN
20.0000 mg | Freq: Once | INTRAVENOUS | Status: AC
  Administered 2020-03-14: 20 mg via INTRAVENOUS
  Filled 2020-03-14: qty 50

## 2020-03-14 MED ORDER — SODIUM CHLORIDE 0.9% FLUSH
10.0000 mL | INTRAVENOUS | Status: DC | PRN
  Administered 2020-03-14: 10 mL

## 2020-03-14 MED ORDER — DIPHENHYDRAMINE HCL 50 MG/ML IJ SOLN
50.0000 mg | Freq: Once | INTRAMUSCULAR | Status: AC
  Administered 2020-03-14: 50 mg via INTRAVENOUS
  Filled 2020-03-14: qty 1

## 2020-03-14 MED ORDER — SODIUM CHLORIDE 0.9 % IV SOLN
140.0000 mg/m2 | Freq: Once | INTRAVENOUS | Status: AC
  Administered 2020-03-14: 288 mg via INTRAVENOUS
  Filled 2020-03-14: qty 48

## 2020-03-14 MED ORDER — PALONOSETRON HCL INJECTION 0.25 MG/5ML
0.2500 mg | Freq: Once | INTRAVENOUS | Status: AC
  Administered 2020-03-14: 0.25 mg via INTRAVENOUS
  Filled 2020-03-14: qty 5

## 2020-03-14 MED ORDER — SODIUM CHLORIDE 0.9 % IV SOLN
10.0000 mg | Freq: Once | INTRAVENOUS | Status: AC
  Administered 2020-03-14: 10 mg via INTRAVENOUS
  Filled 2020-03-14: qty 10

## 2020-03-14 NOTE — Progress Notes (Signed)
[place], [place] [address]   CLINIC:  Medical Oncology/Hematology  PCP:  Perlie Mayo, NP [address] / [place] [address] [phone]   REASON FOR VISIT:  Follow-up for recurrent endometrial cancer  PRIOR THERAPY: TAH & BSO, bilateral pelvic and para-aortic lymph nodes dissections on 04/13/2018  NGS Results: Not done  CURRENT THERAPY: Paclitaxel & Aloxi every 3 weeks  BRIEF ONCOLOGIC HISTORY:  Oncology History  Endometrial cancer ([place])  03/31/2018 Initial Diagnosis   Endometrial cancer ([place])   12/29/2018 -  Chemotherapy    Patient is on Treatment Plan: UTERINE CARBOPLATIN AUC 6 / PACLITAXEL Q21D        CANCER STAGING: Cancer Staging No matching staging information was found for the patient.  INTERVAL HISTORY:  Jessica [name], a 60 y.o. female, returns for routine follow-up and consideration for next cycle of chemotherapy. Jessica [name] was last seen on 02/22/2020.  Due for cycle #20 of paclitaxel and Aloxi today.   Overall, she tells me she has been feeling pretty well. She tolerated the previous treatment well and notes having N/V/D for several days, which are manageable. Her right hip pain has improved over the past few weeks and is taking Percocet less often than daily. The numbness in her feet is stable; she continues taking gabapentin BID. She denies having any abdominal pain or skin rashes. She notes having bumps and itching on her back which she scratched for the last 1.5 weeks.  Overall, she feels ready for next cycle of chemo today.    REVIEW OF SYSTEMS:  Review of Systems  Constitutional: Positive for fatigue (90%). Negative for appetite change.  Gastrointestinal: Positive for diarrhea, nausea (post-chemo) and vomiting (post-chemo). Negative for abdominal pain.  Skin: Positive for itching (on back). Negative for rash.  Neurological: Positive for dizziness, headaches and numbness (feet; stable).  All other  systems reviewed and are negative.    PAST MEDICAL/SURGICAL HISTORY:  Past Medical History:  Diagnosis Date  . Cancer ([place])    Phreesia 09/05/2019  . Elevated blood pressure reading   . Endometrial cancer ([place])   . Hyperlipidemia   . Hypertension   . PMB (postmenopausal bleeding)   . Port-A-Cath in place 12/24/2018   Past Surgical History:  Procedure Laterality Date  . ABDOMINAL HYSTERECTOMY N/A    Phreesia 09/05/2019  . IR IMAGING GUIDED PORT INSERTION  12/28/2018  . IR RADIOLOGIST EVAL & MGMT  06/02/2018  . IR RADIOLOGIST EVAL & MGMT  06/16/2018  . NECK SURGERY  2000   spinal surgery , titanium rod in place   . ROBOTIC ASSISTED TOTAL HYSTERECTOMY WITH BILATERAL SALPINGO OOPHERECTOMY N/A 04/13/2018   Procedure: XI ROBOTIC ASSISTED TOTAL HYSTERECTOMY WITH BILATERAL SALPINGO OOPHORECTOMY;  Surgeon: Everitt Amber, MD;  Location: [facility];  Service: Gynecology;  Laterality: N/A;  . ROBOTIC PELVIC AND PARA-AORTIC LYMPH NODE DISSECTION N/A 04/13/2018   Procedure: XI ROBOTIC PELVIC LYMPHADECTOMY AND PARA-AORTIC LYMPH NODE DISSECTION;  Surgeon: Everitt Amber, MD;  Location: [facility];  Service: Gynecology;  Laterality: N/A;    SOCIAL HISTORY:  Social History   Socioeconomic History  . Marital status: Widowed    Spouse name: Not on file  . Number of children: 3  . Years of education: Not on file  . Highest education level: Not on file  Occupational History  . 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 Smoking status: Never Smoker  . Smokeless  tobacco: Never Used  Vaping Use  . Vaping Use: Never used  Substance and Sexual Activity  . Alcohol use: Yes    Alcohol/week: 4.0 standard drinks    Types: 4 Glasses of wine per week    Comment: weekends  . Drug use: Never  . Sexual activity: Not Currently  Other Topics Concern  . Not on file  Social History Narrative   Lives with daughter and granddaughter here in [place], moved 3 years ago      Cats: Zoe-82 years old,  Eduard Clos, and Horald Pollen      Enjoys: walking when she can tolerate- spending time with granddaughter (her life)      Diet: eats all food groups   Caffeine: 1-2 cups at times, but does not tolerate it all the time   Water: 160 oz daily      Wears seat belt    Does not use phone while driving    Oceanographer at home   Fire extinguisher-no   No weapons   Social Determinants of Health   Financial Resource Strain: [place]   . Difficulty of Paying Living Expenses: Not hard at all  Food Insecurity: No Food Insecurity  . Worried About Charity fundraiser in the Last Year: Never true  . Ran Out of Food in the Last Year: Never true  Transportation Needs: No Transportation Needs  . Lack of Transportation (Medical): No  . Lack of Transportation (Non-Medical): No  Physical Activity: Inactive  . Days of Exercise per Week: 0 days  . Minutes of Exercise per Session: 0 min  Stress: Stress Concern Present  . Feeling of Stress : Rather much  Social Connections: Socially Isolated  . Frequency of Communication with Friends and Family: More than three times a week  . Frequency of Social Gatherings with Friends and Family: More than three times a week  . Attends Religious Services: Never  . Active Member of Clubs or Organizations: No  . Attends Archivist Meetings: Never  . Marital Status: Widowed  Intimate Partner Violence: Not At Risk  . Fear of Current or Ex-Partner: No  . Emotionally Abused: No  . Physically Abused: No  . Sexually Abused: No    FAMILY HISTORY:  Family History  Problem Relation Age of Onset  . Hypertension Father   . Heart disease Father   . Cancer Father        lung  . Hypertension Mother   . Cancer Sister        ovarian cancer    CURRENT MEDICATIONS:  Current Outpatient Medications  Medication Sig Dispense Refill  . amLODipine (NORVASC) 5 MG tablet Take 1 tablet (5 mg total) by mouth daily. 90 tablet 1  . atorvastatin (LIPITOR) 20 MG tablet Take 1  tablet (20 mg total) by mouth daily. 90 tablet 1  . CONSTULOSE 10 GM/15ML solution Take 30 g by mouth daily as needed.     . dicyclomine (BENTYL) 20 MG tablet Take 1 tablet (20 mg total) by mouth every 6 (six) hours. 30 tablet 1  . docusate sodium (COLACE) 100 MG capsule Take 200 mg by mouth 2 (two) times daily as needed for mild constipation (when taking pain medication).    Jessica [name] Kitchen EPINEPHrine 0.3 mg/0.3 mL IJ SOAJ injection Inject 0.3 mg into the muscle as needed for anaphylaxis. 1 each 0  . gabapentin (NEURONTIN) 300 MG capsule TAKE 1 CAPSULE BY MOUTH THREE TIMES DAILY 90 capsule 6  . ibuprofen (ADVIL)  200 MG tablet Take 200 mg by mouth every 6 (six) hours as needed.     . lidocaine-prilocaine (EMLA) cream APPLY SMALL AMOUNT TO PORT A CATH SITE AND COVER WITH PLASTIC WRAP ONE HOUR PRIOR TO CHEMOTHERAPY APPOINTMENTS 30 g 0  . oxyCODONE-acetaminophen (PERCOCET) 5-325 MG tablet Take 1 tablet by mouth every 8 (eight) hours as needed (pain). 30 tablet 0  . PACLITAXEL IV Inject into the vein every 21 ( twenty-one) days.    . polyethylene glycol (MIRALAX / GLYCOLAX) 17 g packet Take 17 g by mouth 2 (two) times daily as needed.     . predniSONE (DELTASONE) 10 MG tablet Take 6 tablets day one, 5 tablets day two, 4 tablets day three, 3 tablets day four, 2 tablets day five, then 1 tablet day six 21 tablet 0  . prochlorperazine (COMPAZINE) 10 MG tablet Take 1 tablet (10 mg total) by mouth every 6 (six) hours as needed (Nausea or vomiting). 30 tablet 1   No current facility-administered medications for this visit.    ALLERGIES:  Allergies  Allergen Reactions  . Carboplatin Other (See Comments)    "body on fire and abdominal pain"  . Nickel Rash    itchy    PHYSICAL EXAM:  Performance status (ECOG): 1 - Symptomatic but completely ambulatory  Vitals:   03/14/20 0756  BP: (!) 151/74  Pulse: 68  Resp: 18  Temp: (!) 96.8 F (36 C)  SpO2: 98%   Wt Readings from Last 3 Encounters:  03/14/20 240 lb  9.6 oz (109.1 kg)  02/22/20 239 lb 6.4 oz (108.6 kg)  01/29/20 235 lb (106.6 kg)   Physical Exam Vitals reviewed.  Constitutional:      Appearance: Normal appearance. She is obese.  Cardiovascular:     Rate and Rhythm: Normal rate and regular rhythm.     Pulses: Normal pulses.     Heart sounds: Normal heart sounds.  Pulmonary:     Effort: Pulmonary effort is normal.     Breath sounds: Normal breath sounds.  Chest:     Comments: Port-a-Cath in R chest Abdominal:     Palpations: Abdomen is soft. There is no mass.     Tenderness: There is no abdominal tenderness.     Hernia: No hernia is present.  Musculoskeletal:     Right lower leg: No edema.     Left lower leg: No edema.  Skin:    Findings: Rash present. Rash is papular (lower back).  Neurological:     General: No focal deficit present.     Mental Status: She is alert and oriented to person, place, and time.  Psychiatric:        Mood and Affect: Mood normal.        Behavior: Behavior normal.     LABORATORY DATA:  I have reviewed the labs as listed.  CBC Latest Ref Rng & Units 03/14/2020 02/22/2020 01/18/2020  WBC 4.0 - 10.5 K/uL 7.0 7.0 5.5  Hemoglobin 12.0 - 15.0 g/dL 11.3(L) 11.8(L) 11.3(L)  Hematocrit 36.0 - 46.0 % 34.8(L) 36.8 34.9(L)  Platelets 150 - 400 K/uL 268 277 279   CMP Latest Ref Rng & Units 03/14/2020 02/22/2020 01/18/2020  Glucose 70 - 99 mg/dL 121(H) 116(H) 110(H)  BUN 6 - 20 mg/dL _0 Creatinine 0.44 - 1.00 mg/dL 0.62 0.57 0.58  Sodium 135 - 145 mmol/L 139 139 141  Potassium 3.5 - 5.1 mmol/L 3.7 3.6 3.8  Chloride 98 - 111 mmol/L 106  106 108  CO2 22 - 32 mmol/L _0 Calcium 8.9 - 10.3 mg/dL 9.3 9.0 9.0  Total Protein 6.5 - 8.1 g/dL 6.9 7.1 6.7  Total Bilirubin 0.3 - 1.2 mg/dL 0.6 0.5 0.3  Alkaline Phos 38 - 126 U/L 95 98 91  AST 15 - 41 U/L _1 ALT 0 - 44 U/L 29 35 24    DIAGNOSTIC IMAGING:  I have independently reviewed the scans and discussed with the patient. MR Lumbar Spine  Wo Contrast  Result Date: 03/09/2020 CLINICAL DATA:  History of endometrial cancer.  Right hip pain. EXAM: MRI LUMBAR SPINE WITHOUT CONTRAST TECHNIQUE: Multiplanar, multisequence MR imaging of the lumbar spine was performed. No intravenous contrast was administered. COMPARISON:  None. FINDINGS: Segmentation: Transitional lumbar anatomy is noted. There is partial lumbarization of S1 with a small disc space at S1-2. Alignment:  Normal Vertebrae:  Normal marrow signal.  No bone lesions or fractures. Conus medullaris and cauda equina: Conus extends to the bottom of L1 level. Conus and cauda equina appear normal. Paraspinal and other soft tissues: No significant paraspinal or retroperitoneal findings. Disc levels: L1-2: No significant findings. L2-3: No significant findings.  Mild facet disease. L3-4: Mild degenerative disc disease with a bulging annulus and mild osteophytic ridging. There is slight flattening of the ventral thecal sac but no significant spinal, lateral recess or foraminal stenosis. L4-5: Degenerative disc disease with a bulging annulus and osteophytic ridging. Mild flattening of the ventral thecal sac but no significant spinal, lateral recess or foraminal stenosis. Mild facet disease. L5-S1: Degenerative disc disease and facet disease with a bulging degenerated and desiccated disc. Mild impression on the thecal sac and mild lateral recess encroachment bilaterally. There is also mild foraminal narrowing bilaterally. Small remnant disc space at S1-2 but no neural compression. IMPRESSION: 1. 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 2. Mild degenerative disc disease at L3-4 and L4-5 but no significant spinal, lateral recess or foraminal stenosis. 3. Mild bilateral lateral recess and foraminal narrowing at L5-S1. Electronically Signed   By: Marijo Sanes M.D.   On: 03/09/2020 12:59   MR HIP RIGHT WO CONTRAST  Result Date: 03/09/2020 CLINICAL DATA:  Chronic right hip pain. EXAM: MR  OF THE RIGHT HIP WITHOUT CONTRAST TECHNIQUE: Multiplanar, multisequence MR imaging was performed. No intravenous contrast was administered. COMPARISON:  CT scan 01/10/2020 FINDINGS: Both hips are normally located. Age advanced degenerative changes are noted on the right side with joint space narrowing, cartilage loss and osteophytic spurring. The left hip demonstrates mild/moderate degenerative changes. No stress fracture or AVN. No hip joint effusion or periarticular fluid collections to suggest a paralabral cyst. No obvious labral tear. Labral degenerative changes are noted. Mild bilateral peritendinosis but no trochanteric bursitis. The pubic symphysis and SI joints are intact. Mild degenerative changes. No pelvic fractures or bone lesions. The surrounding hip and pelvic musculature are unremarkable. No muscle tear, myositis or mass. The hamstring tendons are intact. Mild tendinopathy is noted. No significant intrapelvic abnormalities are identified. IMPRESSION: 1. Age advanced degenerative changes involving the right hip. 2. No stress fracture or AVN. 3. Labral degenerative changes but no obvious labral tear. 4. Mild bilateral peritendinosis but no trochanteric bursitis. 5. No significant intrapelvic abnormalities. Electronically Signed   By: Marijo Sanes M.D.   On: 03/09/2020 12:38     ASSESSMENT:  1. Recurrent endometrial carcinosarcoma: -TAH, BSO, bilateral pelvic and para-aortic lymph node resections on 04/13/2018. -Pathology showed carcinosarcoma (malignant mixed mullerian  tumor) is arising in an endometrial type polyp with no myometrial invasion identified. High-grade. 0/39 lymph nodes positive. PT1APN0, FIGO stage Ia. MMR normal. MSI-stable. -CTAP on 12/09/2018 for abdominal pain showed extensive peritoneal carcinomatosis and large soft tissue mass in the pelvis. -Biopsy of the omental mass on 12/28/2018 shows poorly differentiated carcinoma consistent with her prior malignancy. -Carboplatin  and paclitaxel started on 12/29/2018. -CT scan on 05/02/2019 showed peritoneal implants in the low central small bowel mesentery and left pelvic sidewall have decreased in size measuring 1.5 x 1.9 cm and 1.7 x 2.2 cm. Soft tissue nodule in the left lateral omentum measures 1 x 1.7 cm with no evidence of metastatic disease. -Continuation of chemotherapy until complete response was recommended. -CT AP on 07/04/2019 showed continued positive response to therapy with scattered peritoneal metastasis in the pelvis and left omentum decreased in size. No new metastatic disease. -CTAP on 10/24/2019 after 14 cycles showed substantial reduction in size of soft tissue nodules in the pelvis. 1.4 x 0.9 x 1 cm soft tissue density in the inferior serosal margin of the sigmoid colon, previously 1.7 x 1.1 x 1.7 cm. 2 small soft tissue nodules in the mesenteric adipose tissue above the urinary bladder measures 0.8 cm. -She has developed serious allergic reaction during cycle 14 with carboplatin. -I have discussed with Dr. Denman George. Other options include adding ifosfamide to Taxol or Doxil and combination of lenvatinib with pembrolizumab. In the lenvatinib pembrolizumab trial carcinosarcomas werenotincluded. -Single agent paclitaxel started on 11/09/2019, dose reduced by 20% on 01/18/2020. -CTAP on 01/10/2020 with stable small peritoneal soft tissue nodules in the pelvis. Resolution of soft tissue nodularity in the left abdominal omental fat with no new or progressive disease.  2. Breast abnormality: -Mammogram on 05/02/2019 shows BI-RADS Category 0. She reportedly took Covid shot 1 to 2 weeks prior to the mammogram. Further scans rescheduled on 06/21/2019.  3. Peripheral neuropathy: -She reported numbness and occasional pins-and-needles sensation in the bilateral toes, right more than left. This has started about a week ago.   PLAN:  1. Recurrent endometrial carcinosarcoma: -She tolerated last cycle  reasonably well. -Reviewed her labs.  CA-125 is normal.  LFTs are normal.  CBC is adequate to proceed with next cycle of paclitaxel.  We will continue dose reduction. -Recommend follow-up in 3 weeks with repeat CT of the abdomen and pelvis.  2.Low back pain/right-sided abdominal pain: -Right hip pain has improved. -We reviewed results of MRI of the lumbar spine which showed mild degenerative disc disease at L3-4 and L4-5 with no significant stenosis. -MRI of the right hip shows age advanced degenerative changes.  3. Hypertension: -Continue Norvasc 5 mg daily.  4. Neuropathy: -Numbness in both feet is stable.  Continue gabapentin.  5. Abnormal mammogram: -Last mammogram on 01/10/2020 was BI-RADS Category 3.   Orders placed this encounter:  No orders of the defined types were placed in this encounter.    [name], MD [place] [phone]   I, Milinda Antis, am acting as a scribe for Dr. Sanda Linger.  I, [name] MD, have reviewed the above documentation for accuracy and completeness, and I agree with the above.

## 2020-03-14 NOTE — Progress Notes (Signed)
Patient presents today for Taxol infusion.  Vital signs within parameters for treatment.  Labs pending.  Patient has no new complaints since last visit.  Labs within parameters for treatment.  Message received from [facility] LPN,/Dr. Delton Coombes patient okay for treatment at same dose.  Treatment given today per MD orders.  Tolerated infusion without adverse affects.  Vital signs stable.  No complaints at this time.  Discharge from clinic ambulatory in stable condition.  Alert and oriented X 3.  Follow up with [facility] as scheduled.

## 2020-03-14 NOTE — Patient Instructions (Signed)
[place] at [facility] Discharge Instructions  You were seen today by Dr. Delton Coombes. He went over your recent results. You received your treatment today. You will be scheduled to have a CT scan of your abdomen before your next visit. Dr. Delton Coombes will see you back in 3 weeks for labs and follow up.   Thank you for choosing [place] at [facility] to provide your oncology and hematology care.  To afford each patient quality time with our provider, please arrive at least 15 minutes before your scheduled appointment time.   If you have a lab appointment with the [facility] please come in thru the Main Entrance and check in at the main information desk  You need to re-schedule your appointment should you arrive 10 or more minutes late.  We strive to give you quality time with our providers, and arriving late affects you and other patients whose appointments are after yours.  Also, if you no show three or more times for appointments you may be dismissed from the clinic at the providers discretion.     Again, thank you for choosing [facility].  Our hope is that these requests will decrease the amount of time that you wait before being seen by our physicians.       _____________________________________________________________  Should you have questions after your visit to [facility], please contact our office at [phone] between the hours of 8:00 a.m. and 4:30 p.m.  Voicemails left after 4:00 p.m. will not be returned until the following business day.  For prescription refill requests, have your pharmacy contact our office and allow 72 hours.    Cancer Center Support Programs:   > Cancer Support Group  2nd Tuesday of the month 1pm-2pm, Journey Room

## 2020-03-14 NOTE — Progress Notes (Signed)
Patient was assessed by Dr. Katragadda and labs have been reviewed.  Patient is okay to proceed with treatment today same dose. Primary RN and pharmacy aware.   

## 2020-03-14 NOTE — Patient Instructions (Signed)
[facility] Discharge Instructions for Patients Receiving Chemotherapy  Today you received the following chemotherapy agents   To help prevent nausea and vomiting after your treatment, we encourage you to take your nausea medication   If you develop nausea and vomiting that is not controlled by your nausea medication, call the clinic.   BELOW ARE SYMPTOMS THAT SHOULD BE REPORTED IMMEDIATELY:  *FEVER GREATER THAN 100.5 F  *CHILLS WITH OR WITHOUT FEVER  NAUSEA AND VOMITING THAT IS NOT CONTROLLED WITH YOUR NAUSEA MEDICATION  *UNUSUAL SHORTNESS OF BREATH  *UNUSUAL BRUISING OR BLEEDING  TENDERNESS IN MOUTH AND THROAT WITH OR WITHOUT PRESENCE OF ULCERS  *URINARY PROBLEMS  *BOWEL PROBLEMS  UNUSUAL RASH Items with * indicate a potential emergency and should be followed up as soon as possible.  Feel free to call the clinic should you have any questions or concerns. The clinic phone number is [phone].  Please show the CHEMO ALERT CARD at check-in to the Emergency Department and triage nurse.   

## 2020-03-15 LAB — CA 125: Cancer Antigen (CA) 125: 6.7 U/mL (ref 0.0–38.1)

## 2020-03-23 ENCOUNTER — Ambulatory Visit (HOSPITAL_COMMUNITY)
Admission: RE | Admit: 2020-03-23 | Discharge: 2020-03-23 | Disposition: A | Discharge status: Home / Self Care | Payer: Medicaid Other | Source: Ambulatory Visit | Attending: Hematology | Admitting: Hematology

## 2020-03-23 ENCOUNTER — Other Ambulatory Visit: Payer: Self-pay

## 2020-03-23 DIAGNOSIS — C541 Malignant neoplasm of endometrium: Secondary | ICD-10-CM | POA: Diagnosis present

## 2020-03-23 MED ORDER — IOHEXOL 300 MG/ML  SOLN
100.0000 mL | Freq: Once | INTRAMUSCULAR | Status: AC | PRN
  Administered 2020-03-23: 100 mL via INTRAVENOUS

## 2020-03-28 ENCOUNTER — Other Ambulatory Visit: Payer: Self-pay | Admitting: Family Medicine

## 2020-03-28 DIAGNOSIS — E785 Hyperlipidemia, unspecified: Secondary | ICD-10-CM

## 2020-04-04 ENCOUNTER — Other Ambulatory Visit: Payer: Self-pay

## 2020-04-04 ENCOUNTER — Inpatient Hospital Stay (HOSPITAL_COMMUNITY): Payer: Medicaid Other

## 2020-04-04 ENCOUNTER — Inpatient Hospital Stay (HOSPITAL_BASED_OUTPATIENT_CLINIC_OR_DEPARTMENT_OTHER): Payer: Medicaid Other | Admitting: Hematology

## 2020-04-04 ENCOUNTER — Inpatient Hospital Stay (HOSPITAL_COMMUNITY): Payer: Medicaid Other | Attending: Hematology

## 2020-04-04 VITALS — BP 113/66 | HR 60 | Temp 97.0°F | Resp 18

## 2020-04-04 VITALS — BP 137/69 | HR 69 | Temp 97.0°F | Resp 18 | Wt 234.4 lb

## 2020-04-04 DIAGNOSIS — Z8041 Family history of malignant neoplasm of ovary: Secondary | ICD-10-CM | POA: Diagnosis not present

## 2020-04-04 DIAGNOSIS — I10 Essential (primary) hypertension: Secondary | ICD-10-CM | POA: Diagnosis not present

## 2020-04-04 DIAGNOSIS — Z801 Family history of malignant neoplasm of trachea, bronchus and lung: Secondary | ICD-10-CM | POA: Diagnosis not present

## 2020-04-04 DIAGNOSIS — C541 Malignant neoplasm of endometrium: Secondary | ICD-10-CM | POA: Diagnosis not present

## 2020-04-04 DIAGNOSIS — Z5111 Encounter for antineoplastic chemotherapy: Secondary | ICD-10-CM | POA: Insufficient documentation

## 2020-04-04 DIAGNOSIS — Z79899 Other long term (current) drug therapy: Secondary | ICD-10-CM | POA: Diagnosis not present

## 2020-04-04 DIAGNOSIS — Z9079 Acquired absence of other genital organ(s): Secondary | ICD-10-CM | POA: Diagnosis not present

## 2020-04-04 DIAGNOSIS — Z95828 Presence of other vascular implants and grafts: Secondary | ICD-10-CM

## 2020-04-04 DIAGNOSIS — Z7952 Long term (current) use of systemic steroids: Secondary | ICD-10-CM | POA: Diagnosis not present

## 2020-04-04 DIAGNOSIS — C786 Secondary malignant neoplasm of retroperitoneum and peritoneum: Secondary | ICD-10-CM | POA: Diagnosis present

## 2020-04-04 DIAGNOSIS — Z90710 Acquired absence of both cervix and uterus: Secondary | ICD-10-CM | POA: Diagnosis not present

## 2020-04-04 DIAGNOSIS — Z8249 Family history of ischemic heart disease and other diseases of the circulatory system: Secondary | ICD-10-CM | POA: Diagnosis not present

## 2020-04-04 DIAGNOSIS — Z90722 Acquired absence of ovaries, bilateral: Secondary | ICD-10-CM | POA: Insufficient documentation

## 2020-04-04 LAB — CBC WITH DIFFERENTIAL/PLATELET
Abs Immature Granulocytes: 0.01 10*3/uL (ref 0.00–0.07)
Basophils Absolute: 0.1 10*3/uL (ref 0.0–0.1)
Basophils Relative: 1 %
Eosinophils Absolute: 0.1 10*3/uL (ref 0.0–0.5)
Eosinophils Relative: 2 %
HCT: 36.1 % (ref 36.0–46.0)
Hemoglobin: 11.6 g/dL — ABNORMAL LOW (ref 12.0–15.0)
Immature Granulocytes: 0 %
Lymphocytes Relative: 46 %
Lymphs Abs: 2.5 10*3/uL (ref 0.7–4.0)
MCH: 28.9 pg (ref 26.0–34.0)
MCHC: 32.1 g/dL (ref 30.0–36.0)
MCV: 90 fL (ref 80.0–100.0)
Monocytes Absolute: 0.5 10*3/uL (ref 0.1–1.0)
Monocytes Relative: 10 %
Neutro Abs: 2.1 10*3/uL (ref 1.7–7.7)
Neutrophils Relative %: 41 %
Platelets: 274 10*3/uL (ref 150–400)
RBC: 4.01 MIL/uL (ref 3.87–5.11)
RDW: 16.1 % — ABNORMAL HIGH (ref 11.5–15.5)
WBC: 5.3 10*3/uL (ref 4.0–10.5)
nRBC: 0 % (ref 0.0–0.2)

## 2020-04-04 LAB — COMPREHENSIVE METABOLIC PANEL
ALT: 36 U/L (ref 0–44)
AST: 28 U/L (ref 15–41)
Albumin: 4 g/dL (ref 3.5–5.0)
Alkaline Phosphatase: 81 U/L (ref 38–126)
Anion gap: 11 (ref 5–15)
BUN: 17 mg/dL (ref 6–20)
CO2: 23 mmol/L (ref 22–32)
Calcium: 9.3 mg/dL (ref 8.9–10.3)
Chloride: 107 mmol/L (ref 98–111)
Creatinine, Ser: 0.66 mg/dL (ref 0.44–1.00)
GFR, Estimated: >60 mL/min (ref >60)
Glucose, Bld: 109 mg/dL — ABNORMAL HIGH (ref 70–99)
Potassium: 3.4 mmol/L — ABNORMAL LOW (ref 3.5–5.1)
Sodium: 141 mmol/L (ref 135–145)
Total Bilirubin: 0.4 mg/dL (ref 0.3–1.2)
Total Protein: 7 g/dL (ref 6.5–8.1)

## 2020-04-04 LAB — MAGNESIUM: Magnesium: 1.7 mg/dL (ref 1.7–2.4)

## 2020-04-04 MED ORDER — HEPARIN SOD (PORK) LOCK FLUSH 100 UNIT/ML IV SOLN
500.0000 [IU] | Freq: Once | INTRAVENOUS | Status: AC | PRN
  Administered 2020-04-04: 500 [IU]

## 2020-04-04 MED ORDER — PALONOSETRON HCL INJECTION 0.25 MG/5ML
INTRAVENOUS | Status: AC
  Filled 2020-04-04: qty 5

## 2020-04-04 MED ORDER — SODIUM CHLORIDE 0.9 % IV SOLN
140.0000 mg/m2 | Freq: Once | INTRAVENOUS | Status: AC
  Administered 2020-04-04: 288 mg via INTRAVENOUS
  Filled 2020-04-04: qty 48

## 2020-04-04 MED ORDER — SODIUM CHLORIDE 0.9 % IV SOLN: Freq: Once | INTRAVENOUS | Status: AC

## 2020-04-04 MED ORDER — PALONOSETRON HCL INJECTION 0.25 MG/5ML
0.2500 mg | Freq: Once | INTRAVENOUS | Status: AC
  Administered 2020-04-04: 0.25 mg via INTRAVENOUS

## 2020-04-04 MED ORDER — SODIUM CHLORIDE 0.9% FLUSH
10.0000 mL | INTRAVENOUS | Status: DC | PRN
  Administered 2020-04-04: 10 mL

## 2020-04-04 MED ORDER — SODIUM CHLORIDE 0.9 % IV SOLN
10.0000 mg | Freq: Once | INTRAVENOUS | Status: AC
  Administered 2020-04-04: 10 mg via INTRAVENOUS
  Filled 2020-04-04: qty 10

## 2020-04-04 MED ORDER — FAMOTIDINE IN NACL 20-0.9 MG/50ML-% IV SOLN
INTRAVENOUS | Status: AC
  Filled 2020-04-04: qty 50

## 2020-04-04 MED ORDER — DIPHENHYDRAMINE HCL 50 MG/ML IJ SOLN
50.0000 mg | Freq: Once | INTRAMUSCULAR | Status: AC
  Administered 2020-04-04: 50 mg via INTRAVENOUS

## 2020-04-04 MED ORDER — FAMOTIDINE IN NACL 20-0.9 MG/50ML-% IV SOLN
20.0000 mg | Freq: Once | INTRAVENOUS | Status: AC
  Administered 2020-04-04: 20 mg via INTRAVENOUS

## 2020-04-04 MED ORDER — DIPHENHYDRAMINE HCL 50 MG/ML IJ SOLN
INTRAMUSCULAR | Status: AC
  Filled 2020-04-04: qty 1

## 2020-04-04 NOTE — Patient Instructions (Signed)
Paclitaxel infusion What is this medicine? PACLITAXEL (PAK li TAX el) is a chemotherapy drug. It targets fast dividing cells, like cancer cells, and causes these cells to die. This medicine is used to treat ovarian cancer, breast cancer, lung cancer, Kaposi's sarcoma, and other cancers. This medicine may be used for other purposes; ask your health care provider or pharmacist if you have questions. COMMON BRAND NAME(S): Onxol, Taxol What should I tell my health care provider before I take this medicine? They need to know if you have any of these conditions:  history of irregular heartbeat  liver disease  low blood counts, like low white cell, platelet, or red cell counts  lung or breathing disease, like asthma  tingling of the fingers or toes, or other nerve disorder  an unusual or allergic reaction to paclitaxel, alcohol, polyoxyethylated castor oil, other chemotherapy, other medicines, foods, dyes, or preservatives  pregnant or trying to get pregnant  breast-feeding How should I use this medicine? This drug is given as an infusion into a vein. It is administered in a hospital or clinic by a specially trained health care professional. Talk to your pediatrician regarding the use of this medicine in children. Special care may be needed. Overdosage: If you think you have taken too much of this medicine contact a poison control center or emergency room at once. NOTE: This medicine is only for you. Do not share this medicine with others. What if I miss a dose? It is important not to miss your dose. Call your doctor or health care professional if you are unable to keep an appointment. What may interact with this medicine? Do not take this medicine with any of the following medications:  live virus vaccines This medicine may also interact with the following medications:  antiviral medicines for hepatitis, HIV or AIDS  certain antibiotics like erythromycin and clarithromycin  certain  medicines for fungal infections like ketoconazole and itraconazole  certain medicines for seizures like carbamazepine, phenobarbital, phenytoin  gemfibrozil  nefazodone  rifampin  St. John's wort This list may not describe all possible interactions. Give your health care provider a list of all the medicines, herbs, non-prescription drugs, or dietary supplements you use. Also tell them if you smoke, drink alcohol, or use illegal drugs. Some items may interact with your medicine. What should I watch for while using this medicine? Your condition will be monitored carefully while you are receiving this medicine. You will need important blood work done while you are taking this medicine. This medicine can cause serious allergic reactions. To reduce your risk you will need to take other medicine(s) before treatment with this medicine. If you experience allergic reactions like skin rash, itching or hives, swelling of the face, lips, or tongue, tell your doctor or health care professional right away. In some cases, you may be given additional medicines to help with side effects. Follow all directions for their use. This drug may make you feel generally unwell. This is not uncommon, as chemotherapy can affect healthy cells as well as cancer cells. Report any side effects. Continue your course of treatment even though you feel ill unless your doctor tells you to stop. Call your doctor or health care professional for advice if you get a fever, chills or sore throat, or other symptoms of a cold or flu. Do not treat yourself. This drug decreases your body's ability to fight infections. Try to avoid being around people who are sick. This medicine may increase your risk to bruise  or bleed. Call your doctor or health care professional if you notice any unusual bleeding. Be careful brushing and flossing your teeth or using a toothpick because you may get an infection or bleed more easily. If you have any dental  work done, tell your dentist you are receiving this medicine. Avoid taking products that contain aspirin, acetaminophen, ibuprofen, naproxen, or ketoprofen unless instructed by your doctor. These medicines may hide a fever. Do not become pregnant while taking this medicine. Women should inform their doctor if they wish to become pregnant or think they might be pregnant. There is a potential for serious side effects to an unborn child. Talk to your health care professional or pharmacist for more information. Do not breast-feed an infant while taking this medicine. Men are advised not to father a child while receiving this medicine. This product may contain alcohol. Ask your pharmacist or healthcare provider if this medicine contains alcohol. Be sure to tell all healthcare providers you are taking this medicine. Certain medicines, like metronidazole and disulfiram, can cause an unpleasant reaction when taken with alcohol. The reaction includes flushing, headache, nausea, vomiting, sweating, and increased thirst. The reaction can last from 30 minutes to several hours. What side effects may I notice from receiving this medicine? Side effects that you should report to your doctor or health care professional as soon as possible:  allergic reactions like skin rash, itching or hives, swelling of the face, lips, or tongue  breathing problems  changes in vision  fast, irregular heartbeat  high or low blood pressure  mouth sores  pain, tingling, numbness in the hands or feet  signs of decreased platelets or bleeding - bruising, pinpoint red spots on the skin, black, tarry stools, blood in the urine  signs of decreased red blood cells - unusually weak or tired, feeling faint or lightheaded, falls  signs of infection - fever or chills, cough, sore throat, pain or difficulty passing urine  signs and symptoms of liver injury like dark yellow or brown urine; general ill feeling or flu-like symptoms;  light-colored stools; loss of appetite; nausea; right upper belly pain; unusually weak or tired; yellowing of the eyes or skin  swelling of the ankles, feet, hands  unusually slow heartbeat Side effects that usually do not require medical attention (report to your doctor or health care professional if they continue or are bothersome):  diarrhea  hair loss  loss of appetite  muscle or joint pain  nausea, vomiting  pain, redness, or irritation at site where injected  tiredness This list may not describe all possible side effects. Call your doctor for medical advice about side effects. You may report side effects to FDA at 1-800-FDA-1088. Where should I keep my medicine? This drug is given in a hospital or clinic and will not be stored at home. NOTE: This sheet is a summary. It may not cover all possible information. If you have questions about this medicine, talk to your doctor, pharmacist, or health care provider.  2021 [employer] Standard (2018-12-08 13:37:23)  

## 2020-04-04 NOTE — Progress Notes (Signed)
Patient presents today for Taxol infusion.  Vital signs within parameters for treatment.  Labs pending.  Patient states that she does not believe that she will have treatment today because her scans were clear.

## 2020-04-04 NOTE — Progress Notes (Signed)
Patient was assessed by Dr. Katragadda and labs have been reviewed.  Patient is okay to proceed with treatment today. Primary RN and pharmacy aware.   

## 2020-04-04 NOTE — Patient Instructions (Signed)
[place] at [facility] Discharge Instructions  You were seen today by Dr. Delton Coombes. He went over your recent results. You received your treatment today. You may take 2 tablets of gabapentin at bedtime if needed. Dr. Delton Coombes will see you back in 3 weeks for labs and follow up.   Thank you for choosing [place] at [facility] to provide your oncology and hematology care.  To afford each patient quality time with our provider, please arrive at least 15 minutes before your scheduled appointment time.   If you have a lab appointment with the [place] please come in thru the Main Entrance and check in at the main information desk  You need to re-schedule your appointment should you arrive 10 or more minutes late.  We strive to give you quality time with our providers, and arriving late affects you and other patients whose appointments are after yours.  Also, if you no show three or more times for appointments you may be dismissed from the clinic at the providers discretion.     Again, thank you for choosing [facility].  Our hope is that these requests will decrease the amount of time that you wait before being seen by our physicians.       _____________________________________________________________  Should you have questions after your visit to [facility], please contact our office at [phone] between the hours of 8:00 a.m. and 4:30 p.m.  Voicemails left after 4:00 p.m. will not be returned until the following business day.  For prescription refill requests, have your pharmacy contact our office and allow 72 hours.    Cancer Center Support Programs:   > Cancer Support Group  2nd Tuesday of the month 1pm-2pm, [facility]

## 2020-04-04 NOTE — Progress Notes (Signed)
Labs within parameters for treatment.  Taxol infusion given today per MD orders.  Stable during infusion without adverse affects.  Vital signs stable.  No complaints at this time.  Discharge from clinic ambulatory in stable condition.  Alert and oriented X 3.  Follow up with [facility] as scheduled.

## 2020-04-04 NOTE — Progress Notes (Signed)
[address] [place], [place] [address]   CLINIC:  Medical Oncology/Hematology  PCP:  Perlie Mayo, NP [address] / [place] [address] [phone]   REASON FOR VISIT:  Follow-up for recurrent endometrial cancer  PRIOR THERAPY: TAH & BSO, bilateral pelvic and para-aortic lymph nodes dissections on 04/13/2018  NGS Results: Not done  CURRENT THERAPY: Paclitaxel & Aloxi every 3 weeks  BRIEF ONCOLOGIC HISTORY:  Oncology History  Endometrial cancer ([place])  03/31/2018 Initial Diagnosis   Endometrial cancer ([place])   12/29/2018 -  Chemotherapy    Patient is on Treatment Plan: UTERINE CARBOPLATIN AUC 6 / PACLITAXEL Q21D        CANCER STAGING: Cancer Staging No matching staging information was found for the patient.  INTERVAL HISTORY:  [name], a 60 y.o. female, returns for routine follow-up and consideration for next cycle of chemotherapy. Jessica [name] was last seen on 03/14/2020.  Due for cycle #21 of paclitaxel and Aloxi today.   Today she is accompanied by her daughter. Overall, she tells me she has been feeling pretty well. She notes having some nausea and diarrhea for several days after chemo. The nausea is well-controlled with Compazine and she had several episodes of diarrhea which resolved on their own. She complains of having numbness and tingling in her toes and slightly in her feet, though it does not interfere with her sleep. She continues taking gabapentin in the evening and at bedtime. Her hip pain has improved and is able to walk up to 1 mile before the pain starts.  Overall, she feels ready for next cycle of chemo today.    REVIEW OF SYSTEMS:  Review of Systems  Constitutional: Positive for fatigue (60%). Negative for appetite change.  Gastrointestinal: Positive for diarrhea (several episodes for 3 days after Tx) and nausea (several mild episodes). Negative for vomiting.  Musculoskeletal: Positive for arthralgias (hip  pain improving).  Neurological: Positive for numbness (& tingling in toes and feet).  All other systems reviewed and are negative.   PAST MEDICAL/SURGICAL HISTORY:  Past Medical History:  Diagnosis Date  . Cancer ([place])    Phreesia 09/05/2019  . Elevated blood pressure reading   . Endometrial cancer ([place])   . Hyperlipidemia   . Hypertension   . PMB (postmenopausal bleeding)   . Port-A-Cath in place 12/24/2018   Past Surgical History:  Procedure Laterality Date  . ABDOMINAL HYSTERECTOMY N/A    Phreesia 09/05/2019  . IR IMAGING GUIDED PORT INSERTION  12/28/2018  . IR RADIOLOGIST EVAL & MGMT  06/02/2018  . IR RADIOLOGIST EVAL & MGMT  06/16/2018  . NECK SURGERY  2000   spinal surgery , titanium rod in place   . ROBOTIC ASSISTED TOTAL HYSTERECTOMY WITH BILATERAL SALPINGO OOPHERECTOMY N/A 04/13/2018   Procedure: XI ROBOTIC ASSISTED TOTAL HYSTERECTOMY WITH BILATERAL SALPINGO OOPHORECTOMY;  Surgeon: Everitt Amber, MD;  Location: [facility];  Service: Gynecology;  Laterality: N/A;  . ROBOTIC PELVIC AND PARA-AORTIC LYMPH NODE DISSECTION N/A 04/13/2018   Procedure: XI ROBOTIC PELVIC LYMPHADECTOMY AND PARA-AORTIC LYMPH NODE DISSECTION;  Surgeon: Everitt Amber, MD;  Location: [facility];  Service: Gynecology;  Laterality: N/A;    SOCIAL HISTORY:  Social History   Socioeconomic History  . Marital status: Widowed    Spouse name: Not on file  . Number of children: 3  . Years of education: Not on file  . Highest education level: Not on file  Occupational History  . 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 Smoking status: Never Smoker  . Smokeless tobacco: Never Used  Vaping Use  . Vaping Use: Never used  Substance and Sexual Activity  . Alcohol use: Yes    Alcohol/week: 4.0 standard drinks    Types: 4 Glasses of wine per week    Comment: weekends  . Drug use: Never  . Sexual activity: Not Currently  Other Topics Concern  . Not on file  Social History Narrative   Lives with  daughter and granddaughter here in [place], moved 3 years ago      Cats: Zoe-58 years old, Eduard Clos, and Horald Pollen      Enjoys: walking when she can tolerate- spending time with granddaughter (her life)      Diet: eats all food groups   Caffeine: 1-2 cups at times, but does not tolerate it all the time   Water: 160 oz daily      Wears seat belt    Does not use phone while driving    Oceanographer at home   Fire extinguisher-no   No weapons   Social Determinants of Health   Financial Resource Strain: [place]   . Difficulty of Paying Living Expenses: Not hard at all  Food Insecurity: No Food Insecurity  . Worried About Charity fundraiser in the Last Year: Never true  . Ran Out of Food in the Last Year: Never true  Transportation Needs: No Transportation Needs  . Lack of Transportation (Medical): No  . Lack of Transportation (Non-Medical): No  Physical Activity: Inactive  . Days of Exercise per Week: 0 days  . Minutes of Exercise per Session: 0 min  Stress: Stress Concern Present  . Feeling of Stress : Rather much  Social Connections: Socially Isolated  . Frequency of Communication with Friends and Family: More than three times a week  . Frequency of Social Gatherings with Friends and Family: More than three times a week  . Attends Religious Services: Never  . Active Member of Clubs or Organizations: No  . Attends Archivist Meetings: Never  . Marital Status: Widowed  Intimate Partner Violence: Not At Risk  . Fear of Current or Ex-Partner: No  . Emotionally Abused: No  . Physically Abused: No  . Sexually Abused: No    FAMILY HISTORY:  Family History  Problem Relation Age of Onset  . Hypertension Father   . Heart disease Father   . Cancer Father        lung  . Hypertension Mother   . Cancer Sister        ovarian cancer    CURRENT MEDICATIONS:  Current Outpatient Medications  Medication Sig Dispense Refill  . amLODipine (NORVASC) 5 MG tablet Take 1  tablet (5 mg total) by mouth daily. 90 tablet 1  . atorvastatin (LIPITOR) 20 MG tablet Take 1 tablet by mouth once daily 90 tablet 0  . CONSTULOSE 10 GM/15ML solution Take 30 g by mouth daily as needed.     . dicyclomine (BENTYL) 20 MG tablet Take 1 tablet (20 mg total) by mouth every 6 (six) hours. 30 tablet 1  . docusate sodium (COLACE) 100 MG capsule Take 200 mg by mouth 2 (two) times daily as needed for mild constipation (when taking pain medication).    Marland Kitchen EPINEPHrine 0.3 mg/0.3 mL IJ SOAJ injection Inject 0.3 mg into the muscle as needed for anaphylaxis. 1 each 0  . gabapentin (NEURONTIN) 300  MG capsule TAKE 1 CAPSULE BY MOUTH THREE TIMES DAILY 90 capsule 6  . ibuprofen (ADVIL) 200 MG tablet Take 200 mg by mouth every 6 (six) hours as needed.     . lidocaine-prilocaine (EMLA) cream APPLY SMALL AMOUNT TO PORT A CATH SITE AND COVER WITH PLASTIC WRAP ONE HOUR PRIOR TO CHEMOTHERAPY APPOINTMENTS 30 g 0  . oxyCODONE-acetaminophen (PERCOCET) 5-325 MG tablet Take 1 tablet by mouth every 8 (eight) hours as needed (pain). 30 tablet 0  . PACLITAXEL IV Inject into the vein every 21 ( twenty-one) days.    . polyethylene glycol (MIRALAX / GLYCOLAX) 17 g packet Take 17 g by mouth 2 (two) times daily as needed.     . predniSONE (DELTASONE) 10 MG tablet Take 6 tablets day one, 5 tablets day two, 4 tablets day three, 3 tablets day four, 2 tablets day five, then 1 tablet day six 21 tablet 0  . prochlorperazine (COMPAZINE) 10 MG tablet Take 1 tablet (10 mg total) by mouth every 6 (six) hours as needed (Nausea or vomiting). 30 tablet 1   No current facility-administered medications for this visit.    ALLERGIES:  Allergies  Allergen Reactions  . Carboplatin Other (See Comments)    "body on fire and abdominal pain"  . Nickel Rash    itchy    PHYSICAL EXAM:  Performance status (ECOG): 1 - Symptomatic but completely ambulatory  Vitals:   04/04/20 0835  BP: 137/69  Pulse: 69  Resp: 18  Temp: (!) 97 F  (36.1 C)  SpO2: 98%   Wt Readings from Last 3 Encounters:  04/04/20 234 lb 6.4 oz (106.3 kg)  03/14/20 240 lb 9.6 oz (109.1 kg)  02/22/20 239 lb 6.4 oz (108.6 kg)   Physical Exam Vitals reviewed.  Constitutional:      Appearance: Normal appearance.  Cardiovascular:     Rate and Rhythm: Normal rate and regular rhythm.     Pulses: Normal pulses.     Heart sounds: Normal heart sounds.  Pulmonary:     Effort: Pulmonary effort is normal.     Breath sounds: Normal breath sounds.  Chest:     Comments: Port-a-Cath in R chest Neurological:     General: No focal deficit present.     Mental Status: She is alert and oriented to person, place, and time.  Psychiatric:        Mood and Affect: Mood normal.        Behavior: Behavior normal.     LABORATORY DATA:  I have reviewed the labs as listed.  CBC Latest Ref Rng & Units 04/04/2020 03/14/2020 02/22/2020  WBC 4.0 - 10.5 K/uL 5.3 7.0 7.0  Hemoglobin 12.0 - 15.0 g/dL 11.6(L) 11.3(L) 11.8(L)  Hematocrit 36.0 - 46.0 % 36.1 34.8(L) 36.8  Platelets 150 - 400 K/uL 274 268 277   CMP Latest Ref Rng & Units 04/04/2020 03/14/2020 02/22/2020  Glucose 70 - 99 mg/dL 109(H) 121(H) 116(H)  BUN 6 - 20 mg/dL _0 Creatinine 0.44 - 1.00 mg/dL 0.66 0.62 0.57  Sodium 135 - 145 mmol/L 141 139 139  Potassium 3.5 - 5.1 mmol/L 3.4(L) 3.7 3.6  Chloride 98 - 111 mmol/L 107 106 106  CO2 22 - 32 mmol/L _1 Calcium 8.9 - 10.3 mg/dL 9.3 9.3 9.0  Total Protein 6.5 - 8.1 g/dL 7.0 6.9 7.1  Total Bilirubin 0.3 - 1.2 mg/dL 0.4 0.6 0.5  Alkaline Phos 38 - 126 U/L 81 95 98  AST 15 - 41 U/L _0 ALT 0 - 44 U/L 36 29 35    DIAGNOSTIC IMAGING:  I have independently reviewed the scans and discussed with the patient. MR Lumbar Spine Wo Contrast  Result Date: 03/09/2020 CLINICAL DATA:  History of endometrial cancer.  Right hip pain. EXAM: MRI LUMBAR SPINE WITHOUT CONTRAST TECHNIQUE: Multiplanar, multisequence MR imaging of the lumbar spine was performed. No  intravenous contrast was administered. COMPARISON:  None. FINDINGS: Segmentation: Transitional lumbar anatomy is noted. There is partial lumbarization of S1 with a small disc space at S1-2. Alignment:  Normal Vertebrae:  Normal marrow signal.  No bone lesions or fractures. Conus medullaris and cauda equina: Conus extends to the bottom of L1 level. Conus and cauda equina appear normal. Paraspinal and other soft tissues: No significant paraspinal or retroperitoneal findings. Disc levels: L1-2: No significant findings. L2-3: No significant findings.  Mild facet disease. L3-4: Mild degenerative disc disease with a bulging annulus and mild osteophytic ridging. There is slight flattening of the ventral thecal sac but no significant spinal, lateral recess or foraminal stenosis. L4-5: Degenerative disc disease with a bulging annulus and osteophytic ridging. Mild flattening of the ventral thecal sac but no significant spinal, lateral recess or foraminal stenosis. Mild facet disease. L5-S1: Degenerative disc disease and facet disease with a bulging degenerated and desiccated disc. Mild impression on the thecal sac and mild lateral recess encroachment bilaterally. There is also mild foraminal narrowing bilaterally. Small remnant disc space at S1-2 but no neural compression. IMPRESSION: 1. 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 2. Mild degenerative disc disease at L3-4 and L4-5 but no significant spinal, lateral recess or foraminal stenosis. 3. Mild bilateral lateral recess and foraminal narrowing at L5-S1. Electronically Signed   By: Marijo Sanes M.D.   On: 03/09/2020 12:59   CT Abdomen Pelvis W Contrast  Result Date: 03/25/2020 CLINICAL DATA:  Restaging endometrial cancer. EXAM: CT ABDOMEN AND PELVIS WITH CONTRAST TECHNIQUE: Multidetector CT imaging of the abdomen and pelvis was performed using the standard protocol following bolus administration of intravenous contrast. CONTRAST:  141m OMNIPAQUE  IOHEXOL 300 MG/ML  SOLN COMPARISON:  01/10/2020 FINDINGS: Lower chest: No worrisome pulmonary nodules to suggest pulmonary metastatic disease. The heart is normal in size. No pericardial effusion. Stable small hiatal hernia. Hepatobiliary: No hepatic lesions or findings to suggest patent peritoneal surface disease. No intrahepatic biliary dilatation. Several gallstones are again demonstrated. No findings for acute cholecystitis. No common bile duct dilatation. Pancreas: No mass, inflammation or ductal dilatation. Spleen: Normal size. No focal lesions. Adrenals/Urinary Tract: The adrenal glands and kidneys are unremarkable. No renal lesions or hydronephrosis. The bladder is unremarkable. Stomach/Bowel: The stomach, duodenum, small bowel and colon are unremarkable. No acute inflammatory changes, mass lesions or obstructive findings. The terminal ileum is normal. The appendix is normal. Vascular/Lymphatic: Age advanced atherosclerotic calcifications involving the aorta and branch vessel ostia. No aneurysm or dissection. The major venous structures are patent. No mesenteric or retroperitoneal mass or lymphadenopathy. I do not see any omental nodularity. Stable small soft tissue nodules in the pelvis, likely treated tumor. No new or progressive findings are demonstrated. The largest nodule is along the left aspect of the mid sigmoid colon and measures 10 mm on image 68/2. No free pelvic fluid collections. No inguinal mass or adenopathy. Reproductive: Surgically absent. Other: No ascites or abdominal wall hernia. Musculoskeletal: No significant bony findings. IMPRESSION: 1. Stable small soft tissue nodules in the pelvis, likely treated tumor. No  new or progressive findings. 2. No findings for peritoneal surface disease or omental disease in the abdomen. 3. Cholelithiasis. 4. Age advanced atherosclerotic calcifications involving the aorta and branch vessels. 5. Aortic atherosclerosis. Aortic Atherosclerosis (ICD10-I70.0).  Electronically Signed   By: Marijo Sanes M.D.   On: 03/25/2020 16:58   MR HIP RIGHT WO CONTRAST  Result Date: 03/09/2020 CLINICAL DATA:  Chronic right hip pain. EXAM: MR OF THE RIGHT HIP WITHOUT CONTRAST TECHNIQUE: Multiplanar, multisequence MR imaging was performed. No intravenous contrast was administered. COMPARISON:  CT scan 01/10/2020 FINDINGS: Both hips are normally located. Age advanced degenerative changes are noted on the right side with joint space narrowing, cartilage loss and osteophytic spurring. The left hip demonstrates mild/moderate degenerative changes. No stress fracture or AVN. No hip joint effusion or periarticular fluid collections to suggest a paralabral cyst. No obvious labral tear. Labral degenerative changes are noted. Mild bilateral peritendinosis but no trochanteric bursitis. The pubic symphysis and SI joints are intact. Mild degenerative changes. No pelvic fractures or bone lesions. The surrounding hip and pelvic musculature are unremarkable. No muscle tear, myositis or mass. The hamstring tendons are intact. Mild tendinopathy is noted. No significant intrapelvic abnormalities are identified. IMPRESSION: 1. Age advanced degenerative changes involving the right hip. 2. No stress fracture or AVN. 3. Labral degenerative changes but no obvious labral tear. 4. Mild bilateral peritendinosis but no trochanteric bursitis. 5. No significant intrapelvic abnormalities. Electronically Signed   By: Marijo Sanes M.D.   On: 03/09/2020 12:38     ASSESSMENT:  1. Recurrent endometrial carcinosarcoma: -TAH, BSO, bilateral pelvic and para-aortic lymph node resections on 04/13/2018. -Pathology showed carcinosarcoma (malignant mixed mullerian tumor) is arising in an endometrial type polyp with no myometrial invasion identified. High-grade. 0/39 lymph nodes positive. PT1APN0, FIGO stage Ia. MMR normal. MSI-stable. -CTAP on 12/09/2018 for abdominal pain showed extensive peritoneal  carcinomatosis and large soft tissue mass in the pelvis. -Biopsy of the omental mass on 12/28/2018 shows poorly differentiated carcinoma consistent with her prior malignancy. -Carboplatin and paclitaxel started on 12/29/2018. -CT scan on 05/02/2019 showed peritoneal implants in the low central small bowel mesentery and left pelvic sidewall have decreased in size measuring 1.5 x 1.9 cm and 1.7 x 2.2 cm. Soft tissue nodule in the left lateral omentum measures 1 x 1.7 cm with no evidence of metastatic disease. -Continuation of chemotherapy until complete response was recommended. -CT AP on 07/04/2019 showed continued positive response to therapy with scattered peritoneal metastasis in the pelvis and left omentum decreased in size. No new metastatic disease. -CTAP on 10/24/2019 after 14 cycles showed substantial reduction in size of soft tissue nodules in the pelvis. 1.4 x 0.9 x 1 cm soft tissue density in the inferior serosal margin of the sigmoid colon, previously 1.7 x 1.1 x 1.7 cm. 2 small soft tissue nodules in the mesenteric adipose tissue above the urinary bladder measures 0.8 cm. -She has developed serious allergic reaction during cycle 14 with carboplatin. -I have discussed with Dr. Denman George. Other options include adding ifosfamide to Taxol or Doxil and combination of lenvatinib with pembrolizumab. In the lenvatinib pembrolizumab trial carcinosarcomas werenotincluded. -Single agent paclitaxel started on 11/09/2019, dose reduced by 20% on 01/18/2020. -CTAP on 01/10/2020 with stable small peritoneal soft tissue nodules in the pelvis. Resolution of soft tissue nodularity in the left abdominal omental fat with no new or progressive disease.  2. Breast abnormality: -Mammogram on 05/02/2019 shows BI-RADS Category 0. She reportedly took Covid shot 1 to 2 weeks prior to  the mammogram. Further scans rescheduled on 06/21/2019.  3. Peripheral neuropathy: -She reported numbness and occasional  pins-and-needles sensation in the bilateral toes, right more than left. This has started about a week ago.   PLAN:  1. Recurrent endometrial carcinosarcoma: -We have reviewed her labs.  CA-125 is 6.7. -She is tolerating single agent paclitaxel reasonably well.  She had nausea for couple of days after each treatment and diarrhea for couple of days also which is self controlled. -Neuropathy has been stable. -Reviewed CTAP from 03/23/2020 which showed nodule in the left aspect of the mid sigmoid colon measuring 10 mm and stable.  No omental nodules. -Recommend continuing chemotherapy with paclitaxel at this time and rescan in 3 months. -RTC 3 weeks for follow-up.  2.Low back pain/right-sided abdominal pain: -Right hip and lower back pain has improved, thought to be secondary to degenerative disc disease.  3. Hypertension: -Continue Norvasc 5 mg daily.  4. Neuropathy: -Tingling and numbness in the toes has been stable but predominantly at nighttime.  She cannot tolerate gabapentin during daytime as it makes her dizzy. -Recommend taking 2 tablets of gabapentin 300 mg at bedtime.  5. Abnormal mammogram: -Mammogram on 01/10/2020, BI-RADS Category 3.  Bilateral diagnostic mammogram in April 2022 was recommended.   Orders placed this encounter:  Orders Placed This Encounter  Procedures  . CBC with Differential/Platelet  . Comprehensive metabolic panel  . Magnesium  . [place], MD [facility] [phone]   I, Milinda Antis, am acting as a scribe for Dr. Sanda Linger.  I, [name] MD, have reviewed the above documentation for accuracy and completeness, and I agree with the above.

## 2020-04-25 ENCOUNTER — Inpatient Hospital Stay (HOSPITAL_COMMUNITY): Payer: Medicaid Other | Attending: Hematology

## 2020-04-25 ENCOUNTER — Inpatient Hospital Stay (HOSPITAL_COMMUNITY): Payer: Medicaid Other

## 2020-04-25 ENCOUNTER — Other Ambulatory Visit: Payer: Self-pay

## 2020-04-25 ENCOUNTER — Inpatient Hospital Stay (HOSPITAL_BASED_OUTPATIENT_CLINIC_OR_DEPARTMENT_OTHER): Payer: Medicaid Other | Admitting: Hematology

## 2020-04-25 VITALS — BP 118/72 | HR 58 | Temp 96.9°F | Resp 18 | Wt 233.6 lb

## 2020-04-25 VITALS — BP 114/72 | HR 60 | Temp 97.0°F | Resp 18

## 2020-04-25 DIAGNOSIS — E785 Hyperlipidemia, unspecified: Secondary | ICD-10-CM | POA: Diagnosis not present

## 2020-04-25 DIAGNOSIS — Z801 Family history of malignant neoplasm of trachea, bronchus and lung: Secondary | ICD-10-CM | POA: Insufficient documentation

## 2020-04-25 DIAGNOSIS — I10 Essential (primary) hypertension: Secondary | ICD-10-CM | POA: Diagnosis not present

## 2020-04-25 DIAGNOSIS — C541 Malignant neoplasm of endometrium: Secondary | ICD-10-CM | POA: Diagnosis not present

## 2020-04-25 DIAGNOSIS — C786 Secondary malignant neoplasm of retroperitoneum and peritoneum: Secondary | ICD-10-CM | POA: Insufficient documentation

## 2020-04-25 DIAGNOSIS — Z95828 Presence of other vascular implants and grafts: Secondary | ICD-10-CM

## 2020-04-25 DIAGNOSIS — Z90722 Acquired absence of ovaries, bilateral: Secondary | ICD-10-CM | POA: Insufficient documentation

## 2020-04-25 DIAGNOSIS — Z5111 Encounter for antineoplastic chemotherapy: Secondary | ICD-10-CM | POA: Insufficient documentation

## 2020-04-25 DIAGNOSIS — Z7952 Long term (current) use of systemic steroids: Secondary | ICD-10-CM | POA: Diagnosis not present

## 2020-04-25 DIAGNOSIS — Z8041 Family history of malignant neoplasm of ovary: Secondary | ICD-10-CM | POA: Diagnosis not present

## 2020-04-25 DIAGNOSIS — Z8249 Family history of ischemic heart disease and other diseases of the circulatory system: Secondary | ICD-10-CM | POA: Insufficient documentation

## 2020-04-25 DIAGNOSIS — Z90710 Acquired absence of both cervix and uterus: Secondary | ICD-10-CM | POA: Diagnosis not present

## 2020-04-25 DIAGNOSIS — Z79899 Other long term (current) drug therapy: Secondary | ICD-10-CM | POA: Diagnosis not present

## 2020-04-25 LAB — CBC WITH DIFFERENTIAL/PLATELET
Abs Immature Granulocytes: 0.02 K/uL (ref 0.00–0.07)
Basophils Absolute: 0 K/uL (ref 0.0–0.1)
Basophils Relative: 1 %
Eosinophils Absolute: 0.1 K/uL (ref 0.0–0.5)
Eosinophils Relative: 2 %
HCT: 36.3 % (ref 36.0–46.0)
Hemoglobin: 11.7 g/dL — ABNORMAL LOW (ref 12.0–15.0)
Immature Granulocytes: 0 %
Lymphocytes Relative: 40 %
Lymphs Abs: 2.4 K/uL (ref 0.7–4.0)
MCH: 28.9 pg (ref 26.0–34.0)
MCHC: 32.2 g/dL (ref 30.0–36.0)
MCV: 89.6 fL (ref 80.0–100.0)
Monocytes Absolute: 0.7 K/uL (ref 0.1–1.0)
Monocytes Relative: 11 %
Neutro Abs: 2.7 K/uL (ref 1.7–7.7)
Neutrophils Relative %: 46 %
Platelets: 274 K/uL (ref 150–400)
RBC: 4.05 MIL/uL (ref 3.87–5.11)
RDW: 16.1 % — ABNORMAL HIGH (ref 11.5–15.5)
WBC: 5.8 K/uL (ref 4.0–10.5)
nRBC: 0 % (ref 0.0–0.2)

## 2020-04-25 LAB — COMPREHENSIVE METABOLIC PANEL WITH GFR
ALT: 29 U/L (ref 0–44)
AST: 24 U/L (ref 15–41)
Albumin: 4.1 g/dL (ref 3.5–5.0)
Alkaline Phosphatase: 88 U/L (ref 38–126)
Anion gap: 10 (ref 5–15)
BUN: 15 mg/dL (ref 6–20)
CO2: 24 mmol/L (ref 22–32)
Calcium: 9.5 mg/dL (ref 8.9–10.3)
Chloride: 107 mmol/L (ref 98–111)
Creatinine, Ser: 0.57 mg/dL (ref 0.44–1.00)
GFR, Estimated: >60 mL/min
Glucose, Bld: 110 mg/dL — ABNORMAL HIGH (ref 70–99)
Potassium: 3.7 mmol/L (ref 3.5–5.1)
Sodium: 141 mmol/L (ref 135–145)
Total Bilirubin: 0.5 mg/dL (ref 0.3–1.2)
Total Protein: 7 g/dL (ref 6.5–8.1)

## 2020-04-25 LAB — MAGNESIUM: Magnesium: 1.6 mg/dL — ABNORMAL LOW (ref 1.7–2.4)

## 2020-04-25 MED ORDER — DIPHENHYDRAMINE HCL 50 MG/ML IJ SOLN
INTRAMUSCULAR | Status: AC
  Filled 2020-04-25: qty 1

## 2020-04-25 MED ORDER — DIPHENHYDRAMINE HCL 50 MG/ML IJ SOLN
50.0000 mg | Freq: Once | INTRAMUSCULAR | Status: AC
  Administered 2020-04-25: 50 mg via INTRAVENOUS

## 2020-04-25 MED ORDER — FAMOTIDINE IN NACL 20-0.9 MG/50ML-% IV SOLN
INTRAVENOUS | Status: AC
  Filled 2020-04-25: qty 50

## 2020-04-25 MED ORDER — SODIUM CHLORIDE 0.9 % IV SOLN: Freq: Once | INTRAVENOUS | Status: AC

## 2020-04-25 MED ORDER — SODIUM CHLORIDE 0.9% FLUSH
10.0000 mL | INTRAVENOUS | Status: DC | PRN
  Administered 2020-04-25: 10 mL

## 2020-04-25 MED ORDER — SODIUM CHLORIDE 0.9 % IV SOLN
10.0000 mg | Freq: Once | INTRAVENOUS | Status: AC
  Administered 2020-04-25: 10 mg via INTRAVENOUS
  Filled 2020-04-25: qty 10

## 2020-04-25 MED ORDER — PALONOSETRON HCL INJECTION 0.25 MG/5ML
INTRAVENOUS | Status: AC
  Filled 2020-04-25: qty 5

## 2020-04-25 MED ORDER — PALONOSETRON HCL INJECTION 0.25 MG/5ML
0.2500 mg | Freq: Once | INTRAVENOUS | Status: AC
  Administered 2020-04-25: 0.25 mg via INTRAVENOUS

## 2020-04-25 MED ORDER — HEPARIN SOD (PORK) LOCK FLUSH 100 UNIT/ML IV SOLN
500.0000 [IU] | Freq: Once | INTRAVENOUS | Status: AC | PRN
  Administered 2020-04-25: 500 [IU]

## 2020-04-25 MED ORDER — FAMOTIDINE IN NACL 20-0.9 MG/50ML-% IV SOLN
20.0000 mg | Freq: Once | INTRAVENOUS | Status: AC
  Administered 2020-04-25: 20 mg via INTRAVENOUS

## 2020-04-25 MED ORDER — PACLITAXEL CHEMO INJECTION 300 MG/50ML
140.0000 mg/m2 | Freq: Once | INTRAVENOUS | Status: AC
  Administered 2020-04-25: 288 mg via INTRAVENOUS
  Filled 2020-04-25: qty 48

## 2020-04-25 NOTE — Patient Instructions (Signed)
[place] at [facility] Discharge Instructions  You were seen today by Dr. Delton Coombes. He went over your recent results. You received your treatment today. You will be scheduled to have a mammogram before your next visit. Dr. Delton Coombes will see you back in 3 weeks for labs and follow up.   Thank you for choosing [place] at [facility] to provide your oncology and hematology care.  To afford each patient quality time with our provider, please arrive at least 15 minutes before your scheduled appointment time.   If you have a lab appointment with the [facility] please come in thru the Main Entrance and check in at the main information desk  You need to re-schedule your appointment should you arrive 10 or more minutes late.  We strive to give you quality time with our providers, and arriving late affects you and other patients whose appointments are after yours.  Also, if you no show three or more times for appointments you may be dismissed from the clinic at the providers discretion.     Again, thank you for choosing [facility].  Our hope is that these requests will decrease the amount of time that you wait before being seen by our physicians.       _____________________________________________________________  Should you have questions after your visit to [facility], please contact our office at [phone] between the hours of 8:00 a.m. and 4:30 p.m.  Voicemails left after 4:00 p.m. will not be returned until the following business day.  For prescription refill requests, have your pharmacy contact our office and allow 72 hours.    Cancer Center Support Programs:   > Cancer Support Group  2nd Tuesday of the month 1pm-2pm, Journey Room

## 2020-04-25 NOTE — Progress Notes (Signed)
Patient presents today for Taxol infusion.  Vital signs within parameters for treatment.  Labs pending.  Patients only complaint is of fatigue.  Labs within parameters for treatment.  Taxol infusion given today per MD orders.  Stable during infusion without adverse affects.  Vital signs stable.  No complaints at this time.  Discharge from clinic ambulatory in stable condition.  Alert and oriented X 3.  Follow up with [facility] as scheduled.

## 2020-04-25 NOTE — Patient Instructions (Signed)
Paclitaxel infusion What is this medicine? PACLITAXEL (PAK li TAX el) is a chemotherapy drug. It targets fast dividing cells, like cancer cells, and causes these cells to die. This medicine is used to treat ovarian cancer, breast cancer, lung cancer, Kaposi's sarcoma, and other cancers. This medicine may be used for other purposes; ask your health care provider or pharmacist if you have questions. COMMON BRAND NAME(S): Onxol, Taxol What should I tell my health care provider before I take this medicine? They need to know if you have any of these conditions:  history of irregular heartbeat  liver disease  low blood counts, like low white cell, platelet, or red cell counts  lung or breathing disease, like asthma  tingling of the fingers or toes, or other nerve disorder  an unusual or allergic reaction to paclitaxel, alcohol, polyoxyethylated castor oil, other chemotherapy, other medicines, foods, dyes, or preservatives  pregnant or trying to get pregnant  breast-feeding How should I use this medicine? This drug is given as an infusion into a vein. It is administered in a hospital or clinic by a specially trained health care professional. Talk to your pediatrician regarding the use of this medicine in children. Special care may be needed. Overdosage: If you think you have taken too much of this medicine contact a poison control center or emergency room at once. NOTE: This medicine is only for you. Do not share this medicine with others. What if I miss a dose? It is important not to miss your dose. Call your doctor or health care professional if you are unable to keep an appointment. What may interact with this medicine? Do not take this medicine with any of the following medications:  live virus vaccines This medicine may also interact with the following medications:  antiviral medicines for hepatitis, HIV or AIDS  certain antibiotics like erythromycin and clarithromycin  certain  medicines for fungal infections like ketoconazole and itraconazole  certain medicines for seizures like carbamazepine, phenobarbital, phenytoin  gemfibrozil  nefazodone  rifampin  St. John's wort This list may not describe all possible interactions. Give your health care provider a list of all the medicines, herbs, non-prescription drugs, or dietary supplements you use. Also tell them if you smoke, drink alcohol, or use illegal drugs. Some items may interact with your medicine. What should I watch for while using this medicine? Your condition will be monitored carefully while you are receiving this medicine. You will need important blood work done while you are taking this medicine. This medicine can cause serious allergic reactions. To reduce your risk you will need to take other medicine(s) before treatment with this medicine. If you experience allergic reactions like skin rash, itching or hives, swelling of the face, lips, or tongue, tell your doctor or health care professional right away. In some cases, you may be given additional medicines to help with side effects. Follow all directions for their use. This drug may make you feel generally unwell. This is not uncommon, as chemotherapy can affect healthy cells as well as cancer cells. Report any side effects. Continue your course of treatment even though you feel ill unless your doctor tells you to stop. Call your doctor or health care professional for advice if you get a fever, chills or sore throat, or other symptoms of a cold or flu. Do not treat yourself. This drug decreases your body's ability to fight infections. Try to avoid being around people who are sick. This medicine may increase your risk to bruise  or bleed. Call your doctor or health care professional if you notice any unusual bleeding. Be careful brushing and flossing your teeth or using a toothpick because you may get an infection or bleed more easily. If you have any dental  work done, tell your dentist you are receiving this medicine. Avoid taking products that contain aspirin, acetaminophen, ibuprofen, naproxen, or ketoprofen unless instructed by your doctor. These medicines may hide a fever. Do not become pregnant while taking this medicine. Women should inform their doctor if they wish to become pregnant or think they might be pregnant. There is a potential for serious side effects to an unborn child. Talk to your health care professional or pharmacist for more information. Do not breast-feed an infant while taking this medicine. Men are advised not to father a child while receiving this medicine. This product may contain alcohol. Ask your pharmacist or healthcare provider if this medicine contains alcohol. Be sure to tell all healthcare providers you are taking this medicine. Certain medicines, like metronidazole and disulfiram, can cause an unpleasant reaction when taken with alcohol. The reaction includes flushing, headache, nausea, vomiting, sweating, and increased thirst. The reaction can last from 30 minutes to several hours. What side effects may I notice from receiving this medicine? Side effects that you should report to your doctor or health care professional as soon as possible:  allergic reactions like skin rash, itching or hives, swelling of the face, lips, or tongue  breathing problems  changes in vision  fast, irregular heartbeat  high or low blood pressure  mouth sores  pain, tingling, numbness in the hands or feet  signs of decreased platelets or bleeding - bruising, pinpoint red spots on the skin, black, tarry stools, blood in the urine  signs of decreased red blood cells - unusually weak or tired, feeling faint or lightheaded, falls  signs of infection - fever or chills, cough, sore throat, pain or difficulty passing urine  signs and symptoms of liver injury like dark yellow or brown urine; general ill feeling or flu-like symptoms;  light-colored stools; loss of appetite; nausea; right upper belly pain; unusually weak or tired; yellowing of the eyes or skin  swelling of the ankles, feet, hands  unusually slow heartbeat Side effects that usually do not require medical attention (report to your doctor or health care professional if they continue or are bothersome):  diarrhea  hair loss  loss of appetite  muscle or joint pain  nausea, vomiting  pain, redness, or irritation at site where injected  tiredness This list may not describe all possible side effects. Call your doctor for medical advice about side effects. You may report side effects to FDA at 1-800-FDA-1088. Where should I keep my medicine? This drug is given in a hospital or clinic and will not be stored at home. NOTE: This sheet is a summary. It may not cover all possible information. If you have questions about this medicine, talk to your doctor, pharmacist, or health care provider.  2021 [employer] Standard (2018-12-08 13:37:23)  

## 2020-04-25 NOTE — Progress Notes (Signed)
Patient was assessed by Dr. Katragadda and labs have been reviewed.  Patient is okay to proceed with treatment today. Primary RN and pharmacy aware.   

## 2020-04-25 NOTE — Progress Notes (Signed)
[place], [place] [address]   CLINIC:  Medical Oncology/Hematology  PCP:  Perlie Mayo, NP [address] / [place] [address] [phone]   REASON FOR VISIT:  Follow-up for recurrent endometrial cancer  PRIOR THERAPY: TAH & BSO, bilateral pelvic and para-aortic lymph nodes dissections on 04/13/2018  NGS Results: Not done  CURRENT THERAPY: Paclitaxel & Aloxi every 3 weeks  BRIEF ONCOLOGIC HISTORY:  Oncology History  Endometrial cancer ([place])  03/31/2018 Initial Diagnosis   Endometrial cancer ([place])   12/29/2018 -  Chemotherapy    Patient is on Treatment Plan: UTERINE CARBOPLATIN AUC 6 / PACLITAXEL Q21D        CANCER STAGING: Cancer Staging No matching staging information was found for the patient.  INTERVAL HISTORY:  [name], a 60 y.o. female, returns for routine follow-up and consideration for next cycle of chemotherapy. Adelee was last seen on 04/04/2020.  Due for cycle #22 of paclitaxel and Aloxi today.   Overall, she tells me she has been feeling okay. She tolerated the previous treatment well and denies having any pain in her calves; she complains of having nausea after receiving chemo. She continues having numbness and tingling in her feet and numbness in her hands most prominent in the morning. She continues taking gabapentin 600 mg at night. Her dizziness and headaches are stable. She denies having abdominal pain, vomiting, diarrhea or constipation, recent infections, F/C or night sweats. Her appetite is good.  Overall, she feels ready for next cycle of chemo today.    REVIEW OF SYSTEMS:  Review of Systems  Constitutional: Positive for appetite change (75%) and fatigue (depleted).  Gastrointestinal: Positive for nausea (post chemo). Negative for abdominal pain, constipation, diarrhea and vomiting.  Musculoskeletal: Negative for back pain and myalgias.  Neurological: Positive for dizziness, headaches and  numbness (hands in AM; T/N in feet).  All other systems reviewed and are negative.   PAST MEDICAL/SURGICAL HISTORY:  Past Medical History:  Diagnosis Date  . Cancer ([place])    Phreesia 09/05/2019  . Elevated blood pressure reading   . Endometrial cancer ([place])   . Hyperlipidemia   . Hypertension   . PMB (postmenopausal bleeding)   . Port-A-Cath in place 12/24/2018   Past Surgical History:  Procedure Laterality Date  . ABDOMINAL HYSTERECTOMY N/A    Phreesia 09/05/2019  . IR IMAGING GUIDED PORT INSERTION  12/28/2018  . IR RADIOLOGIST EVAL & MGMT  06/02/2018  . IR RADIOLOGIST EVAL & MGMT  06/16/2018  . NECK SURGERY  2000   spinal surgery , titanium rod in place   . ROBOTIC ASSISTED TOTAL HYSTERECTOMY WITH BILATERAL SALPINGO OOPHERECTOMY N/A 04/13/2018   Procedure: XI ROBOTIC ASSISTED TOTAL HYSTERECTOMY WITH BILATERAL SALPINGO OOPHORECTOMY;  Surgeon: Everitt Amber, MD;  Location: [facility];  Service: Gynecology;  Laterality: N/A;  . ROBOTIC PELVIC AND PARA-AORTIC LYMPH NODE DISSECTION N/A 04/13/2018   Procedure: XI ROBOTIC PELVIC LYMPHADECTOMY AND PARA-AORTIC LYMPH NODE DISSECTION;  Surgeon: Everitt Amber, MD;  Location: [facility];  Service: Gynecology;  Laterality: N/A;    SOCIAL HISTORY:  Social History   Socioeconomic History  . Marital status: Widowed    Spouse name: Not on file  . Number of children: 3  . Years of education: Not on file  . Highest education level: Not on file  Occupational History  . 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 Smoking status: Never Smoker  .  Smokeless tobacco: Never Used  Vaping Use  . Vaping Use: Never used  Substance and Sexual Activity  . Alcohol use: Yes    Alcohol/week: 4.0 standard drinks    Types: 4 Glasses of wine per week    Comment: weekends  . Drug use: Never  . Sexual activity: Not Currently  Other Topics Concern  . Not on file  Social History Narrative   Lives with daughter and granddaughter here in [place],  moved 3 years ago      Cats: Zoe-2 years old, Eduard Clos, and Horald Pollen      Enjoys: walking when she can tolerate- spending time with granddaughter (her life)      Diet: eats all food groups   Caffeine: 1-2 cups at times, but does not tolerate it all the time   Water: 160 oz daily      Wears seat belt    Does not use phone while driving    Oceanographer at home   Fire extinguisher-no   No weapons   Social Determinants of Health   Financial Resource Strain: [place]   . Difficulty of Paying Living Expenses: Not hard at all  Food Insecurity: No Food Insecurity  . Worried About Charity fundraiser in the Last Year: Never true  . Ran Out of Food in the Last Year: Never true  Transportation Needs: No Transportation Needs  . Lack of Transportation (Medical): No  . Lack of Transportation (Non-Medical): No  Physical Activity: Inactive  . Days of Exercise per Week: 0 days  . Minutes of Exercise per Session: 0 min  Stress: Stress Concern Present  . Feeling of Stress : Rather much  Social Connections: Socially Isolated  . Frequency of Communication with Friends and Family: More than three times a week  . Frequency of Social Gatherings with Friends and Family: More than three times a week  . Attends Religious Services: Never  . Active Member of Clubs or Organizations: No  . Attends Archivist Meetings: Never  . Marital Status: Widowed  Intimate Partner Violence: Not At Risk  . Fear of Current or Ex-Partner: No  . Emotionally Abused: No  . Physically Abused: No  . Sexually Abused: No    FAMILY HISTORY:  Family History  Problem Relation Age of Onset  . Hypertension Father   . Heart disease Father   . Cancer Father        lung  . Hypertension Mother   . Cancer Sister        ovarian cancer    CURRENT MEDICATIONS:  Current Outpatient Medications  Medication Sig Dispense Refill  . amLODipine (NORVASC) 5 MG tablet Take 1 tablet (5 mg total) by mouth daily. 90 tablet 1   . atorvastatin (LIPITOR) 20 MG tablet Take 1 tablet by mouth once daily 90 tablet 0  . CONSTULOSE 10 GM/15ML solution Take 30 g by mouth daily as needed.     . docusate sodium (COLACE) 100 MG capsule Take 200 mg by mouth 2 (two) times daily as needed for mild constipation (when taking pain medication).    . gabapentin (NEURONTIN) 300 MG capsule TAKE 1 CAPSULE BY MOUTH THREE TIMES DAILY 90 capsule 6  . PACLITAXEL IV Inject into the vein every 21 ( twenty-one) days.    . predniSONE (DELTASONE) 10 MG tablet Take 6 tablets day one, 5 tablets day two, 4 tablets day three, 3 tablets day four, 2 tablets day five, then 1 tablet day six 21  tablet 0  . dicyclomine (BENTYL) 20 MG tablet Take 1 tablet (20 mg total) by mouth every 6 (six) hours. (Patient not taking: Reported on 04/25/2020) 30 tablet 1  . EPINEPHrine 0.3 mg/0.3 mL IJ SOAJ injection Inject 0.3 mg into the muscle as needed for anaphylaxis. (Patient not taking: Reported on 04/25/2020) 1 each 0  . ibuprofen (ADVIL) 200 MG tablet Take 200 mg by mouth every 6 (six) hours as needed.  (Patient not taking: Reported on 04/25/2020)    . lidocaine-prilocaine (EMLA) cream APPLY SMALL AMOUNT TO PORT A CATH SITE AND COVER WITH PLASTIC WRAP ONE HOUR PRIOR TO CHEMOTHERAPY APPOINTMENTS (Patient not taking: Reported on 04/25/2020) 30 g 0  . oxyCODONE-acetaminophen (PERCOCET) 5-325 MG tablet Take 1 tablet by mouth every 8 (eight) hours as needed (pain). (Patient not taking: Reported on 04/25/2020) 30 tablet 0  . polyethylene glycol (MIRALAX / GLYCOLAX) 17 g packet Take 17 g by mouth 2 (two) times daily as needed.  (Patient not taking: Reported on 04/25/2020)    . prochlorperazine (COMPAZINE) 10 MG tablet Take 1 tablet (10 mg total) by mouth every 6 (six) hours as needed (Nausea or vomiting). (Patient not taking: Reported on 04/25/2020) 30 tablet 1   No current facility-administered medications for this visit.    ALLERGIES:  Allergies  Allergen Reactions  . Carboplatin Other  (See Comments)    "body on fire and abdominal pain"  . Nickel Rash    itchy    PHYSICAL EXAM:  Performance status (ECOG): 1 - Symptomatic but completely ambulatory  Vitals:   04/25/20 0830  BP: 118/72  Pulse: (!) 58  Resp: 18  Temp: (!) 96.9 F (36.1 C)  SpO2: 97%   Wt Readings from Last 3 Encounters:  04/25/20 233 lb 9.6 oz (106 kg)  04/04/20 234 lb 6.4 oz (106.3 kg)  03/14/20 240 lb 9.6 oz (109.1 kg)   Physical Exam Vitals reviewed.  Constitutional:      Appearance: Normal appearance. She is obese.  Cardiovascular:     Rate and Rhythm: Normal rate and regular rhythm.     Pulses: Normal pulses.     Heart sounds: Normal heart sounds.  Pulmonary:     Effort: Pulmonary effort is normal.     Breath sounds: Normal breath sounds.  Chest:     Comments: Port-a-Cath in R chest Abdominal:     Palpations: Abdomen is soft. There is no mass.     Tenderness: There is no abdominal tenderness.     Hernia: No hernia is present.  Musculoskeletal:     Thoracic back: No tenderness or bony tenderness.     Lumbar back: No tenderness or bony tenderness.     Right lower leg: No edema.     Left lower leg: No edema.  Neurological:     General: No focal deficit present.     Mental Status: She is alert and oriented to person, place, and time.  Psychiatric:        Mood and Affect: Mood normal.        Behavior: Behavior normal.     LABORATORY DATA:  I have reviewed the labs as listed.  CBC Latest Ref Rng & Units 04/25/2020 04/04/2020 03/14/2020  WBC 4.0 - 10.5 K/uL 5.8 5.3 7.0  Hemoglobin 12.0 - 15.0 g/dL 11.7(L) 11.6(L) 11.3(L)  Hematocrit 36.0 - 46.0 % 36.3 36.1 34.8(L)  Platelets 150 - 400 K/uL 274 274 268   CMP Latest Ref Rng & Units 04/25/2020 04/04/2020 03/14/2020  Glucose 70 - 99 mg/dL 110(H) 109(H) 121(H)  BUN 6 - 20 mg/dL _0 Creatinine 0.44 - 1.00 mg/dL 0.57 0.66 0.62  Sodium 135 - 145 mmol/L 141 141 139  Potassium 3.5 - 5.1 mmol/L 3.7 3.4(L) 3.7  Chloride 98 - 111  mmol/L 107 107 106  CO2 22 - 32 mmol/L _1 Calcium 8.9 - 10.3 mg/dL 9.5 9.3 9.3  Total Protein 6.5 - 8.1 g/dL 7.0 7.0 6.9  Total Bilirubin 0.3 - 1.2 mg/dL 0.5 0.4 0.6  Alkaline Phos 38 - 126 U/L 88 81 95  AST 15 - 41 U/L _2 ALT 0 - 44 U/L 29 36 29    DIAGNOSTIC IMAGING:  I have independently reviewed the scans and discussed with the patient. No results found.   ASSESSMENT:  1. Recurrent endometrial carcinosarcoma: -TAH, BSO, bilateral pelvic and para-aortic lymph node resections on 04/13/2018. -Pathology showed carcinosarcoma (malignant mixed mullerian tumor) is arising in an endometrial type polyp with no myometrial invasion identified. High-grade. 0/39 lymph nodes positive. PT1APN0, FIGO stage Ia. MMR normal. MSI-stable. -CTAP on 12/09/2018 for abdominal pain showed extensive peritoneal carcinomatosis and large soft tissue mass in the pelvis. -Biopsy of the omental mass on 12/28/2018 shows poorly differentiated carcinoma consistent with her prior malignancy. -Carboplatin and paclitaxel started on 12/29/2018. -CT scan on 05/02/2019 showed peritoneal implants in the low central small bowel mesentery and left pelvic sidewall have decreased in size measuring 1.5 x 1.9 cm and 1.7 x 2.2 cm. Soft tissue nodule in the left lateral omentum measures 1 x 1.7 cm with no evidence of metastatic disease. -Continuation of chemotherapy until complete response was recommended. -CT AP on 07/04/2019 showed continued positive response to therapy with scattered peritoneal metastasis in the pelvis and left omentum decreased in size. No new metastatic disease. -CTAP on 10/24/2019 after 14 cycles showed substantial reduction in size of soft tissue nodules in the pelvis. 1.4 x 0.9 x 1 cm soft tissue density in the inferior serosal margin of the sigmoid colon, previously 1.7 x 1.1 x 1.7 cm. 2 small soft tissue nodules in the mesenteric adipose tissue above the urinary bladder measures 0.8 cm. -She  has developed serious allergic reaction during cycle 14 with carboplatin. -I have discussed with Dr. Denman George. Other options include adding ifosfamide to Taxol or Doxil and combination of lenvatinib with pembrolizumab. In the lenvatinib pembrolizumab trial carcinosarcomas werenotincluded. -Single agent paclitaxel started on 11/09/2019, dose reduced by 20% on 01/18/2020. -CTAP on 01/10/2020 with stable small peritoneal soft tissue nodules in the pelvis. Resolution of soft tissue nodularity in the left abdominal omental fat with no new or progressive disease.  2. Breast abnormality: -Mammogram on 05/02/2019 shows BI-RADS Category 0. She reportedly took Covid shot 1 to 2 weeks prior to the mammogram. Further scans rescheduled on 06/21/2019.  3. Peripheral neuropathy: -She reported numbness and occasional pins-and-needles sensation in the bilateral toes, right more than left. This has started about a week ago.   PLAN:  1. Recurrent endometrial carcinosarcoma: -Last CA-125 was 6.7. -She has tolerated last cycle very well. -CTAP on 03/23/2020 showed nodule in the left aspect of the mid sigmoid colon measuring 10 mm and stable.  No omental nodules. -Reviewed labs from today which showed normal LFTs.  CBC was grossly normal.  She will proceed with next cycle of single agent paclitaxel.  Magnesium is slightly low.  If it continues to be low, will consider magnesium supplements at home. -RTC 3 weeks  for follow-up.  2.Low back pain/right-sided abdominal pain: -Right hip and lower back pain has improved.  Thought to be secondary to DJD.  3. Hypertension: -Continue Norvasc 5 mg daily.  4. Neuropathy: -Continue gabapentin 600 mg at bedtime.  Neuropathy stable.  5. Abnormal mammogram: -Last mammogram on 01/10/2020 was BI-RADS Category 3. -We will order bilateral mammogram in April as recommended by radiology.   Orders placed this encounter:  No orders of the defined types were placed  in this encounter.    [name], MD [facility] [phone]   I, Milinda Antis, am acting as a scribe for Dr. Sanda Linger.  I, [name] MD, have reviewed the above documentation for accuracy and completeness, and I agree with the above.

## 2020-04-26 LAB — CA 125: Cancer Antigen (CA) 125: 6.5 U/mL (ref 0.0–38.1)

## 2020-05-01 ENCOUNTER — Other Ambulatory Visit (HOSPITAL_COMMUNITY): Payer: Self-pay | Admitting: Hematology

## 2020-05-01 DIAGNOSIS — N63 Unspecified lump in unspecified breast: Secondary | ICD-10-CM

## 2020-05-06 ENCOUNTER — Other Ambulatory Visit: Payer: Self-pay | Admitting: Physician Assistant

## 2020-05-06 DIAGNOSIS — I10 Essential (primary) hypertension: Secondary | ICD-10-CM

## 2020-05-09 ENCOUNTER — Other Ambulatory Visit: Payer: Self-pay | Admitting: Physician Assistant

## 2020-05-09 DIAGNOSIS — I10 Essential (primary) hypertension: Secondary | ICD-10-CM

## 2020-05-11 ENCOUNTER — Other Ambulatory Visit: Payer: Self-pay | Admitting: Physician Assistant

## 2020-05-11 DIAGNOSIS — I10 Essential (primary) hypertension: Secondary | ICD-10-CM

## 2020-05-14 ENCOUNTER — Encounter: Payer: Self-pay | Admitting: Nurse Practitioner

## 2020-05-14 ENCOUNTER — Ambulatory Visit (INDEPENDENT_AMBULATORY_CARE_PROVIDER_SITE_OTHER): Payer: Medicaid Other | Admitting: Nurse Practitioner

## 2020-05-14 ENCOUNTER — Other Ambulatory Visit: Payer: Self-pay

## 2020-05-14 DIAGNOSIS — E785 Hyperlipidemia, unspecified: Secondary | ICD-10-CM | POA: Diagnosis not present

## 2020-05-14 DIAGNOSIS — I10 Essential (primary) hypertension: Secondary | ICD-10-CM | POA: Diagnosis not present

## 2020-05-14 MED ORDER — AMLODIPINE BESYLATE 5 MG PO TABS: 5.0000 mg | ORAL_TABLET | Freq: Every day | ORAL | 1 refills | Status: DC

## 2020-05-14 MED ORDER — ATORVASTATIN CALCIUM 20 MG PO TABS
1.0000 | ORAL_TABLET | Freq: Every day | ORAL | 3 refills | Status: DC
Start: 2020-06-30 — End: 2020-07-02

## 2020-05-14 NOTE — Patient Instructions (Signed)
Please have fasting labs drawn 2-3 days prior to your appointment so we can discuss the results during your office visit.  

## 2020-05-14 NOTE — Assessment & Plan Note (Signed)
-  refilled amlodipine BP Readings from Last 3 Encounters:  05/14/20 (!) 146/86  04/25/20 114/72  04/25/20 118/72  -she ran out of amlodipine about a week ago

## 2020-05-14 NOTE — Assessment & Plan Note (Signed)
-  refilled atorvastatin 

## 2020-05-14 NOTE — Progress Notes (Signed)
Acute Office Visit  Subjective:    Patient ID: Jessica [name], female    DOB: Apr 28, 1960, 60 y.o.   MRN: [id number]  Chief Complaint  Patient presents with  . Hypertension    Med refill     HPI Patient is in today for med check.  She has been taking amlodipine for BP.  She had depression at last OV, but declined therapy in November.  She has been receiving treatment for [facility].  On 04/25/20, Dr. Marthann Schiller note says  "Recurrent endometrial carcinosarcoma: -Last CA-125 was 6.7. -She has tolerated last cycle very well. -CTAP on 03/23/2020 showed nodule in the left aspect of the mid sigmoid colon measuring 10 mm and stable.  No omental nodules. -Reviewed labs from today which showed normal LFTs.  CBC was grossly normal.  She will proceed with next cycle of single agent paclitaxel.  Magnesium is slightly low.  If it continues to be low, will consider magnesium supplements at home. -RTC 3 weeks for follow-up."  Past Medical History:  Diagnosis Date  . Cancer ([place])    Phreesia 09/05/2019  . Elevated blood pressure reading   . Endometrial cancer ([place])   . Hyperlipidemia   . Hypertension   . PMB (postmenopausal bleeding)   . Port-A-Cath in place 12/24/2018    Past Surgical History:  Procedure Laterality Date  . ABDOMINAL HYSTERECTOMY N/A    Phreesia 09/05/2019  . IR IMAGING GUIDED PORT INSERTION  12/28/2018  . IR RADIOLOGIST EVAL & MGMT  06/02/2018  . IR RADIOLOGIST EVAL & MGMT  06/16/2018  . NECK SURGERY  2000   spinal surgery , titanium rod in place   . ROBOTIC ASSISTED TOTAL HYSTERECTOMY WITH BILATERAL SALPINGO OOPHERECTOMY N/A 04/13/2018   Procedure: XI ROBOTIC ASSISTED TOTAL HYSTERECTOMY WITH BILATERAL SALPINGO OOPHORECTOMY;  Surgeon: Everitt Amber, MD;  Location: [facility];  Service: Gynecology;  Laterality: N/A;  . ROBOTIC PELVIC AND PARA-AORTIC LYMPH NODE DISSECTION N/A 04/13/2018   Procedure: XI ROBOTIC PELVIC LYMPHADECTOMY AND PARA-AORTIC LYMPH NODE  DISSECTION;  Surgeon: Everitt Amber, MD;  Location: [facility];  Service: Gynecology;  Laterality: N/A;    Family History  Problem Relation Age of Onset  . Hypertension Father   . Heart disease Father   . Cancer Father        lung  . Hypertension Mother   . Cancer Sister        ovarian cancer    Social History   Socioeconomic History  . Marital status: Widowed    Spouse name: Not on file  . Number of children: 3  . Years of education: Not on file  . Highest education level: Not on file  Occupational History  . 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 Smoking status: Never Smoker  . Smokeless tobacco: Never Used  Vaping Use  . Vaping Use: Never used  Substance and Sexual Activity  . Alcohol use: Yes    Alcohol/week: 4.0 standard drinks    Types: 4 Glasses of wine per week    Comment: weekends  . Drug use: Never  . Sexual activity: Not Currently  Other Topics Concern  . Not on file  Social History Narrative   Lives with daughter and granddaughter here in [place], moved 3 years ago      Cats: Zoe-52 years old, Eduard Clos, and Horald Pollen      Enjoys: walking when she can tolerate- spending time with granddaughter (her  life)      Diet: eats all food groups   Caffeine: 1-2 cups at times, but does not tolerate it all the time   Water: 160 oz daily      Wears seat belt    Does not use phone while driving    Smoke detectors at home   Fire extinguisher-no   No weapons   Social Determinants of Health   Financial Resource Strain: Low Risk   . Difficulty of Paying Living Expenses: Not hard at all  Food Insecurity: No Food Insecurity  . Worried About Charity fundraiser in the Last Year: Never true  . Ran Out of Food in the Last Year: Never true  Transportation Needs: No Transportation Needs  . Lack of Transportation (Medical): No  . Lack of Transportation (Non-Medical): No  Physical Activity: Inactive  . Days of Exercise per Week: 0 days  . Minutes of  Exercise per Session: 0 min  Stress: Stress Concern Present  . Feeling of Stress : Rather much  Social Connections: Socially Isolated  . Frequency of Communication with Friends and Family: More than three times a week  . Frequency of Social Gatherings with Friends and Family: More than three times a week  . Attends Religious Services: Never  . Active Member of Clubs or Organizations: No  . Attends Archivist Meetings: Never  . Marital Status: Widowed  Intimate Partner Violence: Not At Risk  . Fear of Current or Ex-Partner: No  . Emotionally Abused: No  . Physically Abused: No  . Sexually Abused: No    Outpatient Medications Prior to Visit  Medication Sig Dispense Refill  . CONSTULOSE 10 GM/15ML solution Take 30 g by mouth daily as needed.     . dicyclomine (BENTYL) 20 MG tablet Take 1 tablet (20 mg total) by mouth every 6 (six) hours. 30 tablet 1  . docusate sodium (COLACE) 100 MG capsule Take 200 mg by mouth 2 (two) times daily as needed for mild constipation (when taking pain medication).    Marland Kitchen EPINEPHrine 0.3 mg/0.3 mL IJ SOAJ injection Inject 0.3 mg into the muscle as needed for anaphylaxis. 1 each 0  . gabapentin (NEURONTIN) 300 MG capsule TAKE 1 CAPSULE BY MOUTH THREE TIMES DAILY 90 capsule 6  . ibuprofen (ADVIL) 200 MG tablet Take 200 mg by mouth every 6 (six) hours as needed.    . lidocaine-prilocaine (EMLA) cream APPLY SMALL AMOUNT TO PORT A CATH SITE AND COVER WITH PLASTIC WRAP ONE HOUR PRIOR TO CHEMOTHERAPY APPOINTMENTS 30 g 0  . oxyCODONE-acetaminophen (PERCOCET) 5-325 MG tablet Take 1 tablet by mouth every 8 (eight) hours as needed (pain). 30 tablet 0  . PACLITAXEL IV Inject into the vein every 21 ( twenty-one) days.    . polyethylene glycol (MIRALAX / GLYCOLAX) 17 g packet Take 17 g by mouth 2 (two) times daily as needed.    . predniSONE (DELTASONE) 10 MG tablet Take 6 tablets day one, 5 tablets day two, 4 tablets day three, 3 tablets day four, 2 tablets day five,  then 1 tablet day six 21 tablet 0  . prochlorperazine (COMPAZINE) 10 MG tablet Take 1 tablet (10 mg total) by mouth every 6 (six) hours as needed (Nausea or vomiting). 30 tablet 1  . amLODipine (NORVASC) 5 MG tablet Take 1 tablet (5 mg total) by mouth daily. 90 tablet 1  . atorvastatin (LIPITOR) 20 MG tablet Take 1 tablet by mouth once daily 90 tablet 0  No facility-administered medications prior to visit.    Allergies  Allergen Reactions  . Carboplatin Other (See Comments)    "body on fire and abdominal pain"  . Nickel Rash    itchy    Review of Systems  Constitutional: Negative.   Respiratory: Negative.   Cardiovascular: Negative.        Ran out of BP meds  Psychiatric/Behavioral: Negative.        Objective:    Physical Exam Constitutional:      Appearance: Normal appearance. She is obese.  Cardiovascular:     Rate and Rhythm: Normal rate and regular rhythm.     Pulses: Normal pulses.     Heart sounds: Normal heart sounds.  Pulmonary:     Effort: Pulmonary effort is normal.     Breath sounds: Normal breath sounds.  Musculoskeletal:        General: Normal range of motion.  Neurological:     Mental Status: She is alert.  Psychiatric:     Comments: Tearful when asked about depression; currently undergoing chemo for endometrial cancer     BP (!) 146/86   Pulse 84   Temp 98.8 F (37.1 C)   Resp 20   Ht _0  (1.626 m)   Wt 234 lb (106.1 kg)   SpO2 95%   BMI 40.17 kg/m  Wt Readings from Last 3 Encounters:  05/14/20 234 lb (106.1 kg)  04/25/20 233 lb 9.6 oz (106 kg)  04/04/20 234 lb 6.4 oz (106.3 kg)    Health Maintenance Due  Topic Date Due  . COLONOSCOPY (Pts 45-33yr Insurance coverage will need to be confirmed)  Never done  . COVID-19 Vaccine (3 - Moderna risk 4-dose series) 06/07/2019    There are no preventive care reminders to display for this patient.   No results found for: TSH Lab Results  Component Value Date   WBC 5.8 04/25/2020   HGB  11.7 (L) 04/25/2020   HCT 36.3 04/25/2020   MCV 89.6 04/25/2020   PLT 274 04/25/2020   Lab Results  Component Value Date   NA 141 04/25/2020   K 3.7 04/25/2020   CO2 24 04/25/2020   GLUCOSE 110 (H) 04/25/2020   BUN 15 04/25/2020   CREATININE 0.57 04/25/2020   BILITOT 0.5 04/25/2020   ALKPHOS 88 04/25/2020   AST 24 04/25/2020   ALT 29 04/25/2020   PROT 7.0 04/25/2020   ALBUMIN 4.1 04/25/2020   CALCIUM 9.5 04/25/2020   ANIONGAP 10 04/25/2020   Lab Results  Component Value Date   CHOL 202 (H) 03/04/2019   Lab Results  Component Value Date   HDL 75 03/04/2019   Lab Results  Component Value Date   LDLCALC 115 (H) 03/04/2019   Lab Results  Component Value Date   TRIG 62 03/04/2019   Lab Results  Component Value Date   CHOLHDL 2.7 03/04/2019   No results found for: HGBA1C     Assessment & Plan:   Problem List Items Addressed This Visit      Cardiovascular and Mediastinum   Essential hypertension    -refilled amlodipine BP Readings from Last 3 Encounters:  05/14/20 (!) 146/86  04/25/20 114/72  04/25/20 118/72  -she ran out of amlodipine about a week ago       Relevant Medications   amLODipine (NORVASC) 5 MG tablet   atorvastatin (LIPITOR) 20 MG tablet (Start on 06/30/2020)   Other Relevant Orders   CBC with Differential/Platelet   CMP14+EGFR  Lipid Panel With LDL/HDL Ratio     Other   Hyperlipidemia    -refilled atorvastatin       Relevant Medications   amLODipine (NORVASC) 5 MG tablet   atorvastatin (LIPITOR) 20 MG tablet (Start on 06/30/2020)   Other Relevant Orders   Lipid Panel With LDL/HDL Ratio       Meds ordered this encounter  Medications  . amLODipine (NORVASC) 5 MG tablet    Sig: Take 1 tablet (5 mg total) by mouth daily.    Dispense:  90 tablet    Refill:  1  . atorvastatin (LIPITOR) 20 MG tablet    Sig: Take 1 tablet (20 mg total) by mouth daily.    Dispense:  90 tablet    Refill:  [place], NP

## 2020-05-15 ENCOUNTER — Ambulatory Visit (HOSPITAL_COMMUNITY)
Admission: RE | Admit: 2020-05-15 | Discharge: 2020-05-15 | Disposition: A | Discharge status: Home / Self Care | Payer: Medicaid Other | Source: Ambulatory Visit | Attending: Hematology | Admitting: Hematology

## 2020-05-15 ENCOUNTER — Other Ambulatory Visit (HOSPITAL_COMMUNITY): Payer: Self-pay | Admitting: Hematology

## 2020-05-15 DIAGNOSIS — N6310 Unspecified lump in the right breast, unspecified quadrant: Secondary | ICD-10-CM | POA: Diagnosis present

## 2020-05-15 DIAGNOSIS — N63 Unspecified lump in unspecified breast: Secondary | ICD-10-CM | POA: Diagnosis present

## 2020-05-15 DIAGNOSIS — N6315 Unspecified lump in the right breast, overlapping quadrants: Secondary | ICD-10-CM | POA: Insufficient documentation

## 2020-05-15 DIAGNOSIS — C541 Malignant neoplasm of endometrium: Secondary | ICD-10-CM | POA: Diagnosis not present

## 2020-05-15 DIAGNOSIS — R928 Other abnormal and inconclusive findings on diagnostic imaging of breast: Secondary | ICD-10-CM

## 2020-05-15 NOTE — Progress Notes (Signed)
Jessica [name], [place] [address]   CLINIC:  Medical Oncology/Hematology  PCP:  Jessica Mayo, NP [address] / [place] [address] [phone]   REASON FOR VISIT:  Follow-up for recurrent endometrial cancer  PRIOR THERAPY: TAH & BSO, bilateral pelvic and para-aortic lymph nodes dissections on 04/13/2018  NGS Results: Not done  CURRENT THERAPY: Paclitaxel & Aloxi every 3 weeks  BRIEF ONCOLOGIC HISTORY:  Oncology History  Endometrial cancer ([facility])  03/31/2018 Initial Diagnosis   Endometrial cancer ([facility])   12/29/2018 -  Chemotherapy    Patient is on Treatment Plan: UTERINE CARBOPLATIN AUC 6 / PACLITAXEL Q21D        CANCER STAGING: Cancer Staging No matching staging information was found for the patient.  INTERVAL HISTORY:  [name], a 60 y.o. female, returns for routine follow-up and consideration for next cycle of chemotherapy. Jessica [name] was last seen on 04/25/2020.  Due for cycle #23 of paclitaxel and Aloxi today.   Overall, she tells me she has been feeling pretty well.  She denies any worsening of her neuropathy.  Denied any GI effects including nausea, vomiting or diarrhea or constipation.  She has some headaches which are chronic.  Chronic numbness in the feet has also been stable.  Overall, she feels ready for next cycle of chemo today.    REVIEW OF SYSTEMS:  Review of Systems  Neurological: Positive for dizziness, headaches and numbness.  Psychiatric/Behavioral: Positive for sleep disturbance. The patient is nervous/anxious.   All other systems reviewed and are negative.   PAST MEDICAL/SURGICAL HISTORY:  Past Medical History:  Diagnosis Date  . Cancer ([facility])    Phreesia 09/05/2019  . Elevated blood pressure reading   . Endometrial cancer ([facility])   . Hyperlipidemia   . Hypertension   . PMB (postmenopausal bleeding)   . Port-A-Cath in place 12/24/2018   Past Surgical History:  Procedure Laterality Date  .  ABDOMINAL HYSTERECTOMY N/A    Phreesia 09/05/2019  . IR IMAGING GUIDED PORT INSERTION  12/28/2018  . IR RADIOLOGIST EVAL & MGMT  06/02/2018  . IR RADIOLOGIST EVAL & MGMT  06/16/2018  . NECK SURGERY  2000   spinal surgery , titanium rod in place   . ROBOTIC ASSISTED TOTAL HYSTERECTOMY WITH BILATERAL SALPINGO OOPHERECTOMY N/A 04/13/2018   Procedure: XI ROBOTIC ASSISTED TOTAL HYSTERECTOMY WITH BILATERAL SALPINGO OOPHORECTOMY;  Surgeon: Everitt Amber, MD;  Location: [facility];  Service: Gynecology;  Laterality: N/A;  . ROBOTIC PELVIC AND PARA-AORTIC LYMPH NODE DISSECTION N/A 04/13/2018   Procedure: XI ROBOTIC PELVIC LYMPHADECTOMY AND PARA-AORTIC LYMPH NODE DISSECTION;  Surgeon: Everitt Amber, MD;  Location: [facility];  Service: Gynecology;  Laterality: N/A;    SOCIAL HISTORY:  Social History   Socioeconomic History  . Marital status: Widowed    Spouse name: Not on file  . Number of children: 3  . Years of education: Not on file  . Highest education level: Not on file  Occupational History  . 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 Smoking status: Never Smoker  . Smokeless tobacco: Never Used  Vaping Use  . Vaping Use: Never used  Substance and Sexual Activity  . Alcohol use: Yes    Alcohol/week: 4.0 standard drinks    Types: 4 Glasses of wine per week    Comment: weekends  . Drug use: Never  . Sexual activity: Not Currently  Other Topics Concern  .  Not on file  Social History Narrative   Lives with daughter and granddaughter here in [place], moved 3 years ago      Cats: Zoe-39 years old, Eduard Clos, and Horald Pollen      Enjoys: walking when she can tolerate- spending time with granddaughter (her life)      Diet: eats all food groups   Caffeine: 1-2 cups at times, but does not tolerate it all the time   Water: 160 oz daily      Wears seat belt    Does not use phone while driving    Oceanographer at home   Fire extinguisher-no   No weapons   Social Determinants of  Health   Financial Resource Strain: [place]   . Difficulty of Paying Living Expenses: Not hard at all  Food Insecurity: No Food Insecurity  . Worried About Charity fundraiser in the Last Year: Never true  . Ran Out of Food in the Last Year: Never true  Transportation Needs: No Transportation Needs  . Lack of Transportation (Medical): No  . Lack of Transportation (Non-Medical): No  Physical Activity: Inactive  . Days of Exercise per Week: 0 days  . Minutes of Exercise per Session: 0 min  Stress: Stress Concern Present  . Feeling of Stress : Rather much  Social Connections: Socially Isolated  . Frequency of Communication with Friends and Family: More than three times a week  . Frequency of Social Gatherings with Friends and Family: More than three times a week  . Attends Religious Services: Never  . Active Member of Clubs or Organizations: No  . Attends Archivist Meetings: Never  . Marital Status: Widowed  Intimate Partner Violence: Not At Risk  . Fear of Current or Ex-Partner: No  . Emotionally Abused: No  . Physically Abused: No  . Sexually Abused: No    FAMILY HISTORY:  Family History  Problem Relation Age of Onset  . Hypertension Father   . Heart disease Father   . Cancer Father        lung  . Hypertension Mother   . Cancer Sister        ovarian cancer    CURRENT MEDICATIONS:  Current Outpatient Medications  Medication Sig Dispense Refill  . amLODipine (NORVASC) 5 MG tablet Take 1 tablet (5 mg total) by mouth daily. 90 tablet 1  . [START ON 06/30/2020] atorvastatin (LIPITOR) 20 MG tablet Take 1 tablet (20 mg total) by mouth daily. 90 tablet 3  . CONSTULOSE 10 GM/15ML solution Take 30 g by mouth daily as needed.     . dicyclomine (BENTYL) 20 MG tablet Take 1 tablet (20 mg total) by mouth every 6 (six) hours. 30 tablet 1  . docusate sodium (COLACE) 100 MG capsule Take 200 mg by mouth 2 (two) times daily as needed for mild constipation (when taking pain  medication).    Marland Kitchen EPINEPHrine 0.3 mg/0.3 mL IJ SOAJ injection Inject 0.3 mg into the muscle as needed for anaphylaxis. 1 each 0  . gabapentin (NEURONTIN) 300 MG capsule TAKE 1 CAPSULE BY MOUTH THREE TIMES DAILY 90 capsule 6  . ibuprofen (ADVIL) 200 MG tablet Take 200 mg by mouth every 6 (six) hours as needed.    . lidocaine-prilocaine (EMLA) cream APPLY SMALL AMOUNT TO PORT A CATH SITE AND COVER WITH PLASTIC WRAP ONE HOUR PRIOR TO CHEMOTHERAPY APPOINTMENTS 30 g 0  . oxyCODONE-acetaminophen (PERCOCET) 5-325 MG tablet Take 1 tablet by mouth every 8 (eight)  hours as needed (pain). 30 tablet 0  . PACLITAXEL IV Inject into the vein every 21 ( twenty-one) days.    . polyethylene glycol (MIRALAX / GLYCOLAX) 17 g packet Take 17 g by mouth 2 (two) times daily as needed.    . predniSONE (DELTASONE) 10 MG tablet Take 6 tablets day one, 5 tablets day two, 4 tablets day three, 3 tablets day four, 2 tablets day five, then 1 tablet day six 21 tablet 0  . prochlorperazine (COMPAZINE) 10 MG tablet Take 1 tablet (10 mg total) by mouth every 6 (six) hours as needed (Nausea or vomiting). 30 tablet 1   No current facility-administered medications for this visit.    ALLERGIES:  Allergies  Allergen Reactions  . Carboplatin Other (See Comments)    "body on fire and abdominal pain"  . Nickel Rash    itchy    PHYSICAL EXAM:  Performance status (ECOG): 1 - Symptomatic but completely ambulatory  There were no vitals filed for this visit. Wt Readings from Last 3 Encounters:  05/14/20 234 lb (106.1 kg)  04/25/20 233 lb 9.6 oz (106 kg)  04/04/20 234 lb 6.4 oz (106.3 kg)   Physical Exam Vitals reviewed.  Constitutional:      Appearance: Normal appearance.  Cardiovascular:     Rate and Rhythm: Normal rate and regular rhythm.     Heart sounds: Normal heart sounds.  Pulmonary:     Effort: Pulmonary effort is normal.     Breath sounds: Normal breath sounds.  Chest:     Comments: Port-a-Cath in R  chest Abdominal:     General: There is no distension.     Palpations: Abdomen is soft. There is no mass.  Musculoskeletal:     Right lower leg: No edema.     Left lower leg: No edema.  Skin:    General: Skin is warm.  Neurological:     General: No focal deficit present.     Mental Status: She is alert.  Psychiatric:        Mood and Affect: Mood normal.        Behavior: Behavior normal.     LABORATORY DATA:  I have reviewed the labs as listed.  CBC Latest Ref Rng & Units 04/25/2020 04/04/2020 03/14/2020  WBC 4.0 - 10.5 K/uL 5.8 5.3 7.0  Hemoglobin 12.0 - 15.0 g/dL 11.7(L) 11.6(L) 11.3(L)  Hematocrit 36.0 - 46.0 % 36.3 36.1 34.8(L)  Platelets 150 - 400 K/uL 274 274 268   CMP Latest Ref Rng & Units 04/25/2020 04/04/2020 03/14/2020  Glucose 70 - 99 mg/dL 110(H) 109(H) 121(H)  BUN 6 - 20 mg/dL 15 17 16   Creatinine 0.44 - 1.00 mg/dL 0.57 0.66 0.62  Sodium 135 - 145 mmol/L 141 141 139  Potassium 3.5 - 5.1 mmol/L 3.7 3.4(L) 3.7  Chloride 98 - 111 mmol/L 107 107 106  CO2 22 - 32 mmol/L 24 23 24   Calcium 8.9 - 10.3 mg/dL 9.5 9.3 9.3  Total Protein 6.5 - 8.1 g/dL 7.0 7.0 6.9  Total Bilirubin 0.3 - 1.2 mg/dL 0.5 0.4 0.6  Alkaline Phos 38 - 126 U/L 88 81 95  AST 15 - 41 U/L 24 28 22   ALT 0 - 44 U/L 29 36 29    DIAGNOSTIC IMAGING:  I have independently reviewed the scans and discussed with the patient. US BREAST LTD UNI RIGHT INC AXILLA  Result Date: 05/15/2020 CLINICAL DATA:  Short-term follow-up for probably benign right breast mass. EXAM: DIGITAL DIAGNOSTIC  BILATERAL MAMMOGRAM WITH TOMOSYNTHESIS AND CAD; ULTRASOUND RIGHT BREAST LIMITED TECHNIQUE: Bilateral digital diagnostic mammography and breast tomosynthesis was performed. The images were evaluated with computer-aided detection.; Targeted ultrasound examination of the right breast was performed COMPARISON:  Previous exams. ACR Breast Density Category b: There are scattered areas of fibroglandular density. FINDINGS: No suspicious masses  or calcifications are seen in either breast. There is no mammographic evidence of malignancy in either breast. Targeted ultrasound of the outer right breast was performed. The oval circumscribed hypoechoic mass at the 9:30 position (previously labeled as 9 o'clock 6 cm) is unchanged measuring 0.4 x 0.2 x 0.4 cm. There is oval somewhat tubular mass in the right breast at 9 o'clock 4 cm from nipple measuring 1.1 x 0.4 x 0.9 cm and possibly related to a duct. No lymphadenopathy seen in the right axilla. IMPRESSION: 1. Oval/tubular 1.1 cm mass in the right breast the 9 o'clock position possibly related to a duct. 2. Stable appearance of probably benign mass in the right breast at the 9:30 position (previously labeled 9 o'clock 6 cm from nipple). RECOMMENDATION: Recommend ultrasound-guided biopsy of the 1.1 cm mass in the right breast at the 9 position. This will be scheduled for the patient. I have discussed the findings and recommendations with the patient. If applicable, a reminder letter will be sent to the patient regarding the next appointment. BI-RADS CATEGORY  4: Suspicious. Electronically Signed   By: Everlean Alstrom M.D.   On: 05/15/2020 11:59   MM DIAG BREAST TOMO BILATERAL  Result Date: 05/15/2020 CLINICAL DATA:  Short-term follow-up for probably benign right breast mass. EXAM: DIGITAL DIAGNOSTIC BILATERAL MAMMOGRAM WITH TOMOSYNTHESIS AND CAD; ULTRASOUND RIGHT BREAST LIMITED TECHNIQUE: Bilateral digital diagnostic mammography and breast tomosynthesis was performed. The images were evaluated with computer-aided detection.; Targeted ultrasound examination of the right breast was performed COMPARISON:  Previous exams. ACR Breast Density Category b: There are scattered areas of fibroglandular density. FINDINGS: No suspicious masses or calcifications are seen in either breast. There is no mammographic evidence of malignancy in either breast. Targeted ultrasound of the outer right breast was performed. The  oval circumscribed hypoechoic mass at the 9:30 position (previously labeled as 9 o'clock 6 cm) is unchanged measuring 0.4 x 0.2 x 0.4 cm. There is oval somewhat tubular mass in the right breast at 9 o'clock 4 cm from nipple measuring 1.1 x 0.4 x 0.9 cm and possibly related to a duct. No lymphadenopathy seen in the right axilla. IMPRESSION: 1. Oval/tubular 1.1 cm mass in the right breast the 9 o'clock position possibly related to a duct. 2. Stable appearance of probably benign mass in the right breast at the 9:30 position (previously labeled 9 o'clock 6 cm from nipple). RECOMMENDATION: Recommend ultrasound-guided biopsy of the 1.1 cm mass in the right breast at the 9 position. This will be scheduled for the patient. I have discussed the findings and recommendations with the patient. If applicable, a reminder letter will be sent to the patient regarding the next appointment. BI-RADS CATEGORY  4: Suspicious. Electronically Signed   By: Everlean Alstrom M.D.   On: 05/15/2020 11:59     ASSESSMENT:  1. Recurrent endometrial carcinosarcoma: -TAH, BSO, bilateral pelvic and para-aortic lymph node resections on 04/13/2018. -Pathology showed carcinosarcoma (malignant mixed mullerian tumor) is arising in an endometrial type polyp with no myometrial invasion identified. High-grade. 0/39 lymph nodes positive. PT1APN0, FIGO stage Ia. MMR normal. MSI-stable. -CTAP on 12/09/2018 for abdominal pain showed extensive peritoneal carcinomatosis and large soft  tissue mass in the pelvis. -Biopsy of the omental mass on 12/28/2018 shows poorly differentiated carcinoma consistent with her prior malignancy. -Carboplatin and paclitaxel started on 12/29/2018. -CT scan on 05/02/2019 showed peritoneal implants in the low central small bowel mesentery and left pelvic sidewall have decreased in size measuring 1.5 x 1.9 cm and 1.7 x 2.2 cm. Soft tissue nodule in the left lateral omentum measures 1 x 1.7 cm with no evidence of metastatic  disease. -Continuation of chemotherapy until complete response was recommended. -CT AP on 07/04/2019 showed continued positive response to therapy with scattered peritoneal metastasis in the pelvis and left omentum decreased in size. No new metastatic disease. -CTAP on 10/24/2019 after 14 cycles showed substantial reduction in size of soft tissue nodules in the pelvis. 1.4 x 0.9 x 1 cm soft tissue density in the inferior serosal margin of the sigmoid colon, previously 1.7 x 1.1 x 1.7 cm. 2 small soft tissue nodules in the mesenteric adipose tissue above the urinary bladder measures 0.8 cm. -She has developed serious allergic reaction during cycle 14 with carboplatin. -I have discussed with Dr. Denman George. Other options include adding ifosfamide to Taxol or Doxil and combination of lenvatinib with pembrolizumab. In the lenvatinib pembrolizumab trial carcinosarcomas werenotincluded. -Single agent paclitaxel started on 11/09/2019, dose reduced by 20% on 01/18/2020. -CTAP on 01/10/2020 with stable small peritoneal soft tissue nodules in the pelvis. Resolution of soft tissue nodularity in the left abdominal omental fat with no new or progressive disease.  2. Breast abnormality: -Mammogram on 05/02/2019 shows BI-RADS Category 0. She reportedly took Covid shot 1 to 2 weeks prior to the mammogram. Further scans rescheduled on 06/21/2019.  3. Peripheral neuropathy: -She reported numbness and occasional pins-and-needles sensation in the bilateral toes, right more than left. This has started about a week ago.   PLAN:  1. Recurrent endometrial carcinosarcoma: -She has tolerated last cycle reasonably well.  No major side effects. - CA125 was 6.5. - LFTs are normal.  CBC was grossly normal. - Magnesium today is 1.6.  She will receive 1 g of magnesium.  She will proceed with her next cycle today.  RTC 3 weeks for follow-up.  Plan to repeat scans in first week of June.  2.Low back pain/right-sided  abdominal pain: -Right hip and lower back pain has improved.  Thought to be from DJD.  3. Hypertension: -Continue Norvasc 5 mg daily.  4. Neuropathy: -Continue gabapentin 600 mg at bedtime.  5. Abnormal mammogram: -We reviewed results of mammogram from 05/15/2020 which was BI-RADS Category 4. - She is scheduled for biopsy next Tuesday.   Orders placed this encounter:  No orders of the defined types were placed in this encounter.    [name], MD [facility] [phone]   I, Milinda Antis, am acting as a scribe for Dr. Sanda Linger.  I, [name] MD, have reviewed the above documentation for accuracy and completeness, and I agree with the above.

## 2020-05-16 ENCOUNTER — Inpatient Hospital Stay (HOSPITAL_COMMUNITY): Payer: Medicaid Other

## 2020-05-16 ENCOUNTER — Other Ambulatory Visit: Payer: Self-pay

## 2020-05-16 ENCOUNTER — Inpatient Hospital Stay (HOSPITAL_BASED_OUTPATIENT_CLINIC_OR_DEPARTMENT_OTHER): Payer: Medicaid Other | Admitting: Hematology

## 2020-05-16 VITALS — BP 123/69 | HR 74 | Temp 97.1°F | Resp 18

## 2020-05-16 VITALS — BP 132/75 | HR 76 | Temp 97.1°F | Resp 18 | Wt 234.6 lb

## 2020-05-16 DIAGNOSIS — C541 Malignant neoplasm of endometrium: Secondary | ICD-10-CM

## 2020-05-16 DIAGNOSIS — Z5111 Encounter for antineoplastic chemotherapy: Secondary | ICD-10-CM | POA: Diagnosis not present

## 2020-05-16 DIAGNOSIS — Z95828 Presence of other vascular implants and grafts: Secondary | ICD-10-CM

## 2020-05-16 LAB — CBC WITH DIFFERENTIAL/PLATELET
Abs Immature Granulocytes: 0.03 10*3/uL (ref 0.00–0.07)
Basophils Absolute: 0.1 10*3/uL (ref 0.0–0.1)
Basophils Relative: 1 %
Eosinophils Absolute: 0.1 10*3/uL (ref 0.0–0.5)
Eosinophils Relative: 1 %
HCT: 36 % (ref 36.0–46.0)
Hemoglobin: 11.7 g/dL — ABNORMAL LOW (ref 12.0–15.0)
Immature Granulocytes: 0 %
Lymphocytes Relative: 34 %
Lymphs Abs: 2.5 10*3/uL (ref 0.7–4.0)
MCH: 29.4 pg (ref 26.0–34.0)
MCHC: 32.5 g/dL (ref 30.0–36.0)
MCV: 90.5 fL (ref 80.0–100.0)
Monocytes Absolute: 0.6 10*3/uL (ref 0.1–1.0)
Monocytes Relative: 8 %
Neutro Abs: 4 10*3/uL (ref 1.7–7.7)
Neutrophils Relative %: 56 %
Platelets: 297 10*3/uL (ref 150–400)
RBC: 3.98 MIL/uL (ref 3.87–5.11)
RDW: 16.2 % — ABNORMAL HIGH (ref 11.5–15.5)
WBC: 7.3 10*3/uL (ref 4.0–10.5)
nRBC: 0 % (ref 0.0–0.2)

## 2020-05-16 LAB — COMPREHENSIVE METABOLIC PANEL
ALT: 30 U/L (ref 0–44)
AST: 22 U/L (ref 15–41)
Albumin: 3.8 g/dL (ref 3.5–5.0)
Alkaline Phosphatase: 93 U/L (ref 38–126)
Anion gap: 8 (ref 5–15)
BUN: 17 mg/dL (ref 6–20)
CO2: 23 mmol/L (ref 22–32)
Calcium: 9.3 mg/dL (ref 8.9–10.3)
Chloride: 107 mmol/L (ref 98–111)
Creatinine, Ser: 0.57 mg/dL (ref 0.44–1.00)
GFR, Estimated: >60 mL/min (ref >60)
Glucose, Bld: 122 mg/dL — ABNORMAL HIGH (ref 70–99)
Potassium: 3.9 mmol/L (ref 3.5–5.1)
Sodium: 138 mmol/L (ref 135–145)
Total Bilirubin: 0.6 mg/dL (ref 0.3–1.2)
Total Protein: 6.7 g/dL (ref 6.5–8.1)

## 2020-05-16 LAB — MAGNESIUM: Magnesium: 1.6 mg/dL — ABNORMAL LOW (ref 1.7–2.4)

## 2020-05-16 MED ORDER — MAGNESIUM SULFATE IN D5W 1-5 GM/100ML-% IV SOLN
1.0000 g | Freq: Once | INTRAVENOUS | Status: AC
  Administered 2020-05-16: 1 g via INTRAVENOUS
  Filled 2020-05-16: qty 100

## 2020-05-16 MED ORDER — PALONOSETRON HCL INJECTION 0.25 MG/5ML
0.2500 mg | Freq: Once | INTRAVENOUS | Status: AC
  Administered 2020-05-16: 0.25 mg via INTRAVENOUS
  Filled 2020-05-16: qty 5

## 2020-05-16 MED ORDER — MAGNESIUM SULFATE 50 % IJ SOLN: 1.0000 g | Freq: Once | INTRAVENOUS | Status: DC

## 2020-05-16 MED ORDER — DEXAMETHASONE SODIUM PHOSPHATE 100 MG/10ML IJ SOLN
10.0000 mg | Freq: Once | INTRAMUSCULAR | Status: AC
  Administered 2020-05-16: 10 mg via INTRAVENOUS
  Filled 2020-05-16: qty 10

## 2020-05-16 MED ORDER — HEPARIN SOD (PORK) LOCK FLUSH 100 UNIT/ML IV SOLN
500.0000 [IU] | Freq: Once | INTRAVENOUS | Status: AC | PRN
  Administered 2020-05-16: 500 [IU]

## 2020-05-16 MED ORDER — SODIUM CHLORIDE 0.9 % IV SOLN
140.0000 mg/m2 | Freq: Once | INTRAVENOUS | Status: AC
  Administered 2020-05-16: 288 mg via INTRAVENOUS
  Filled 2020-05-16: qty 48

## 2020-05-16 MED ORDER — SODIUM CHLORIDE 0.9 % IV SOLN: Freq: Once | INTRAVENOUS | Status: AC

## 2020-05-16 MED ORDER — FAMOTIDINE IN NACL 20-0.9 MG/50ML-% IV SOLN
20.0000 mg | Freq: Once | INTRAVENOUS | Status: AC
  Administered 2020-05-16: 20 mg via INTRAVENOUS
  Filled 2020-05-16: qty 50

## 2020-05-16 MED ORDER — SODIUM CHLORIDE 0.9% FLUSH
10.0000 mL | INTRAVENOUS | Status: DC | PRN
Start: 2020-05-16 — End: 2020-05-16
  Administered 2020-05-16: 10 mL

## 2020-05-16 MED ORDER — DIPHENHYDRAMINE HCL 50 MG/ML IJ SOLN
50.0000 mg | Freq: Once | INTRAMUSCULAR | Status: AC
  Administered 2020-05-16: 50 mg via INTRAVENOUS
  Filled 2020-05-16: qty 1

## 2020-05-16 NOTE — Progress Notes (Signed)
Patient was assessed by Dr. Katragadda and labs have been reviewed. Okay to proceed with treatment today. Primary RN and pharmacy aware.  

## 2020-05-16 NOTE — Patient Instructions (Signed)
[facility]  Discharge Instructions: Thank you for choosing [facility] to provide your oncology and hematology care.  If you have a lab appointment with the [facility], please come in thru the Main Entrance and check in at the main information desk.  Wear comfortable clothing and clothing appropriate for easy access to any Portacath or PICC line.   We strive to give you quality time with your provider. You may need to reschedule your appointment if you arrive late (15 or more minutes).  Arriving late affects you and other patients whose appointments are after yours.  Also, if you miss three or more appointments without notifying the office, you may be dismissed from the clinic at the provider's discretion.      For prescription refill requests, have your pharmacy contact our office and allow 72 hours for refills to be completed.    Today you received the following chemotherapy and/or immunotherapy agents taxol.    To help prevent nausea and vomiting after your treatment, we encourage you to take your nausea medication as directed.  BELOW ARE SYMPTOMS THAT SHOULD BE REPORTED IMMEDIATELY: . *FEVER GREATER THAN 100.4 F (38 C) OR HIGHER . *CHILLS OR SWEATING . *NAUSEA AND VOMITING THAT IS NOT CONTROLLED WITH YOUR NAUSEA MEDICATION . *UNUSUAL SHORTNESS OF BREATH . *UNUSUAL BRUISING OR BLEEDING . *URINARY PROBLEMS (pain or burning when urinating, or frequent urination) . *BOWEL PROBLEMS (unusual diarrhea, constipation, pain near the anus) . TENDERNESS IN MOUTH AND THROAT WITH OR WITHOUT PRESENCE OF ULCERS (sore throat, sores in mouth, or a toothache) . UNUSUAL RASH, SWELLING OR PAIN  . UNUSUAL VAGINAL DISCHARGE OR ITCHING   Items with * indicate a potential emergency and should be followed up as soon as possible or go to the Emergency Department if any problems should occur.  Please show the CHEMOTHERAPY ALERT CARD or IMMUNOTHERAPY ALERT CARD at check-in to the  Emergency Department and triage nurse.  Should you have questions after your visit or need to cancel or reschedule your appointment, please contact [facility] [phone]  and follow the prompts.  Office hours are 8:00 a.m. to 4:30 p.m. Monday - Friday. Please note that voicemails left after 4:00 p.m. may not be returned until the following business day.  We are closed weekends and major holidays. You have access to a nurse at all times for urgent questions. Please call the main number to the clinic [phone] and follow the prompts.  For any non-urgent questions, you may also contact your provider using MyChart. We now offer e-Visits for anyone 57 and older to request care online for non-urgent symptoms. For details visit [email or web address].   Also download the MyChart app! Go to the app store, search "MyChart", open the app, select [facility], and log in with your MyChart username and password.  Due to Covid, a mask is required upon entering the hospital/clinic. If you do not have a mask, one will be given to you upon arrival. For doctor visits, patients may have 1 support person aged 89 or older with them. For treatment visits, patients cannot have anyone with them due to current Covid guidelines and our immunocompromised population.

## 2020-05-16 NOTE — Progress Notes (Signed)
Pt here for D1C23 of taxol every 21 days. Last treatment on 04/25/20. Vital signs WNL for treatment.  No complaints to note since last treatment.  Magnesium 1.6.  1 gram magnesium IV today.  Okay for treatment today per Dr Raliegh Ip.   Tolerated treatment well today without incidence.  Stable during and after treatment.  AVS reviewed. Discharged in stable condition ambulatory. Vital signs stable prior to discharge.

## 2020-05-16 NOTE — Patient Instructions (Signed)
[facility] at [facility] Discharge Instructions  You were seen and examined today by Dr. Delton Coombes. You received your treatment today.    Please follow up as scheduled.  Thank you for choosing [facility] at [facility] to provide your oncology and hematology care.  To afford each patient quality time with our provider, please arrive at least 15 minutes before your scheduled appointment time.   If you have a lab appointment with the [facility] please come in thru the Main Entrance and check in at the main information desk.  You need to re-schedule your appointment should you arrive 10 or more minutes late.  We strive to give you quality time with our providers, and arriving late affects you and other patients whose appointments are after yours.  Also, if you no show three or more times for appointments you may be dismissed from the clinic at the providers discretion.     Again, thank you for choosing [facility].  Our hope is that these requests will decrease the amount of time that you wait before being seen by our physicians.       _____________________________________________________________  Should you have questions after your visit to [facility], please contact our office at [phone] and follow the prompts.  Our office hours are 8:00 a.m. and 4:30 p.m. Monday - Friday.  Please note that voicemails left after 4:00 p.m. may not be returned until the following business day.  We are closed weekends and major holidays.  You do have access to a nurse 24-7, just call the main number to the clinic [phone] and do not press any options, hold on the line and a nurse will answer the phone.    For prescription refill requests, have your pharmacy contact our office and allow 72 hours.    Due to Covid, you will need to wear a mask upon entering the hospital. If you do not have a mask, a mask will be given to you at the  Main Entrance upon arrival. For doctor visits, patients may have 1 support person age 97 or older with them. For treatment visits, patients can not have anyone with them due to social distancing guidelines and our immunocompromised population.

## 2020-05-22 ENCOUNTER — Encounter (HOSPITAL_COMMUNITY): Payer: Self-pay

## 2020-05-22 ENCOUNTER — Ambulatory Visit (HOSPITAL_COMMUNITY)
Admission: RE | Admit: 2020-05-22 | Discharge: 2020-05-22 | Disposition: A | Discharge status: Home / Self Care | Payer: Medicaid Other | Source: Ambulatory Visit | Attending: Hematology | Admitting: Hematology

## 2020-05-22 ENCOUNTER — Ambulatory Visit (HOSPITAL_COMMUNITY)
Admission: RE | Admit: 2020-05-22 | Discharge: 2020-05-22 | Disposition: A | Discharge status: Home / Self Care | Payer: Medicaid Other | Source: Ambulatory Visit | Attending: Family Medicine | Admitting: Family Medicine

## 2020-05-22 ENCOUNTER — Other Ambulatory Visit: Payer: Self-pay

## 2020-05-22 ENCOUNTER — Other Ambulatory Visit (HOSPITAL_COMMUNITY): Payer: Self-pay | Admitting: Family Medicine

## 2020-05-22 DIAGNOSIS — R928 Other abnormal and inconclusive findings on diagnostic imaging of breast: Secondary | ICD-10-CM | POA: Diagnosis present

## 2020-05-22 MED ORDER — LIDOCAINE HCL (PF) 2 % IJ SOLN
INTRAMUSCULAR | Status: AC
  Administered 2020-05-22: 10 mL via INTRADERMAL
  Filled 2020-05-22: qty 10

## 2020-05-22 MED ORDER — LIDOCAINE-EPINEPHRINE (PF) 1 %-1:200000 IJ SOLN
10.0000 mL | Freq: Once | INTRAMUSCULAR | Status: AC
  Administered 2020-05-22: 10 mL via INTRADERMAL

## 2020-05-22 MED ORDER — SODIUM BICARBONATE 4.2 % IV SOLN
INTRAVENOUS | Status: AC
  Administered 2020-05-22: 2.5 meq
  Filled 2020-05-22: qty 10

## 2020-05-22 MED ORDER — LIDOCAINE HCL (PF) 2 % IJ SOLN: 10.0000 mL | Freq: Once | INTRAMUSCULAR | Status: AC

## 2020-05-22 NOTE — Discharge Instructions (Signed)
Breast Biopsy, Care After °These instructions give you information about caring for yourself after your procedure. Your doctor may also give you more specific instructions. Call your doctor if you have any problems or questions after your procedure. °What can I expect after the procedure? °After your procedure, it is common to have: °· Bruising on your breast. °· Numbness, tingling, or pain near your biopsy site. °Follow these instructions at home: °Medicines °· Take over-the-counter and prescription medicines only as told by your doctor. °· Do not drive for 24 hours if you were given a medicine to help you relax (sedative) during your procedure. °· Do not drink alcohol while taking pain medicine. °· Do not drive or use heavy machinery while taking prescription pain medicine. °Biopsy site care °· Follow instructions from your doctor about how to take care of your cut from surgery (incision) or your puncture area. Make sure you: °? Wash your hands with soap and water before you change your bandage (dressing). If you cannot use soap and water, use hand sanitizer. °? Change your bandage as told by your doctor. °? Leave stitches (sutures), skin glue, or skin tape (adhesive strips) in place. They may need to stay in place for 2 weeks or longer. If tape strips get loose and curl up, you may trim the loose edges. Do not remove tape strips completely unless your doctor says it is okay. °· If you have stitches, keep them dry when you take a bath or a shower. °· Check your cut or puncture area every day for signs of infection. Check for: °? Redness, swelling, or pain. °? Fluid or blood. °? Warmth. °? Pus or a bad smell. °· Protect the biopsy area. Do not let the area get bumped.  °  °  °Activity °· If you had a cut during your procedure, avoid activities that could pull your cut open. These include: °? Stretching. °? Reaching over your head. °? Exercise. °? Sports. °? Lifting anything that weighs more than 3 lb (1.4  kg). °· Return to your normal activities as told by your doctor. Ask your doctor what activities are safe for you. °Managing pain, stiffness, and swelling °If told, put ice on the biopsy site to relieve swelling: °· Put ice in a plastic bag. °· Place a towel between your skin and the bag. °· Leave the ice on for 20 minutes, 2-3 times a day. °General instructions °· Continue your normal diet. °· Wear a good support bra for as long as told by your doctor. °· Get checked for extra fluid around your lymph nodes (lymphedema) as often as told by your doctor. °· Keep all follow-up visits as told by your doctor. This is important. °Contact a doctor if: °· You notice any of the following at the biopsy site: °? More redness, swelling, or pain. °? More fluid or blood coming from the site. °? The site feels warm to the touch. °? Pus or a bad smell coming from the site. °? The site breaks open after the stitches or skin tape strips have been removed. °· You have a rash. °· You have a fever. °Get help right away if: °· You have more bleeding from the biopsy site. Get help right away if bleeding is more than a small spot. °· You have trouble breathing. °· You have red streaks around the biopsy site. °Summary °· After your procedure, it is common to have bruising, numbness, tingling, or pain near the biopsy site. °· Do not   drive or use heavy machinery while taking prescription pain medicine. °· Wear a good support bra for as long as told by your doctor. °· If you had a cut during your procedure, avoid activities that may pull the cut open. Ask your doctor what activities are safe for you. °This information is not intended to replace advice given to you by your health care provider. Make sure you discuss any questions you have with your health care provider. °Document Revised: 09/19/2019 Document Reviewed: 06/25/2017 °[employer] Patient Education © 2021 [employer] ° °

## 2020-05-22 NOTE — Sedation Documentation (Signed)
PT tolerated right breast biopsy well today with NAD noted. PT verbalized understanding of discharge instructions. PT ambulated back to the mammogram area this time and given an ice pack and printed discharge instructions.

## 2020-05-23 LAB — SURGICAL PATHOLOGY

## 2020-06-05 NOTE — Progress Notes (Signed)
 [facility] [address] [place], [place] [address]   CLINIC:  Medical Oncology/Hematology  PCP:  Mills, Hannah M, NP [address] / [place] [address] [phone]   REASON FOR VISIT:  Follow-up for recurrent endometrial cancer  PRIOR THERAPY: TAH & BSO, bilateral pelvic and para-aortic lymph nodes dissections on 04/13/2018  NGS Results: not done  CURRENT THERAPY: Paclitaxel & Aloxi every 3 weeks  BRIEF ONCOLOGIC HISTORY:  Oncology History  Endometrial cancer (HCC)  03/31/2018 Initial Diagnosis   Endometrial cancer (HCC)   12/29/2018 -  Chemotherapy    Patient is on Treatment Plan: UTERINE CARBOPLATIN AUC 6 / PACLITAXEL Q21D        CANCER STAGING: Cancer Staging No matching staging information was found for the patient.  INTERVAL HISTORY:  [name], a 59 y.o. female, returns for routine follow-up and consideration for next cycle of chemotherapy. Jessica [name] was last seen on 05/16/2020.  Due for cycle #24 of Carboplatin today.   Overall, she tells me she has been feeling pretty well. She reports mild diarrhea a few days after Tx but it was tolerable. She denies any nausea or vomiting. She reports intermittent back and hip pain, and numbness in her toes which is more prominent in the right foot. Her pain in her hip is worsened with sitting and as well as activity. She denies numbness in her fingertips. She is taking percocet 3 times weekly for the back and hip pain.   Overall, she feels ready for next cycle of chemo today.   REVIEW OF SYSTEMS:  Review of Systems  Constitutional: Positive for fatigue (75%). Negative for appetite change.  Gastrointestinal: Positive for diarrhea and nausea (post chemo). Negative for vomiting.  Neurological: Positive for dizziness and numbness (feet).  Psychiatric/Behavioral: Positive for depression and sleep disturbance. The patient is nervous/anxious.   All other systems reviewed and are negative.   PAST  MEDICAL/SURGICAL HISTORY:  Past Medical History:  Diagnosis Date  . Cancer (HCC)    Phreesia 09/05/2019  . Elevated blood pressure reading   . Endometrial cancer (HCC)   . Hyperlipidemia   . Hypertension   . PMB (postmenopausal bleeding)   . Port-A-Cath in place 12/24/2018   Past Surgical History:  Procedure Laterality Date  . ABDOMINAL HYSTERECTOMY N/A    Phreesia 09/05/2019  . IR IMAGING GUIDED PORT INSERTION  12/28/2018  . IR RADIOLOGIST EVAL & MGMT  06/02/2018  . IR RADIOLOGIST EVAL & MGMT  06/16/2018  . NECK SURGERY  2000   spinal surgery , titanium rod in place   . ROBOTIC ASSISTED TOTAL HYSTERECTOMY WITH BILATERAL SALPINGO OOPHERECTOMY N/A 04/13/2018   Procedure: XI ROBOTIC ASSISTED TOTAL HYSTERECTOMY WITH BILATERAL SALPINGO OOPHORECTOMY;  Surgeon: Rossi, Emma, MD;  Location: [facility];  Service: Gynecology;  Laterality: N/A;  . ROBOTIC PELVIC AND PARA-AORTIC LYMPH NODE DISSECTION N/A 04/13/2018   Procedure: XI ROBOTIC PELVIC LYMPHADECTOMY AND PARA-AORTIC LYMPH NODE DISSECTION;  Surgeon: Rossi, Emma, MD;  Location: [facility];  Service: Gynecology;  Laterality: N/A;    SOCIAL HISTORY:  Social History   Socioeconomic History  . Marital status: Widowed    Spouse name: Not on file  . Number of children: 3  . Years of education: Not on file  . Highest education level: Not on file  Occupational History  . 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 Smoking status: Never Smoker  . Smokeless tobacco: Never Used  Vaping   Use  . Vaping Use: Never used  Substance and Sexual Activity  . Alcohol use: Yes    Alcohol/week: 4.0 standard drinks    Types: 4 Glasses of wine per week    Comment: weekends  . Drug use: Never  . Sexual activity: Not Currently  Other Topics Concern  . Not on file  Social History Narrative   Lives with daughter and granddaughter here in [place], moved 3 years ago      Cats: Zoe-21 years old, Charlie, and Storm      Enjoys: walking when  she can tolerate- spending time with granddaughter (her life)      Diet: eats all food groups   Caffeine: 1-2 cups at times, but does not tolerate it all the time   Water: 160 oz daily      Wears seat belt    Does not use phone while driving    Smoke detectors at home   Fire extinguisher-no   No weapons   Social Determinants of Health   Financial Resource Strain: Low Risk   . Difficulty of Paying Living Expenses: Not hard at all  Food Insecurity: No Food Insecurity  . Worried About Running Out of Food in the Last Year: Never true  . Ran Out of Food in the Last Year: Never true  Transportation Needs: No Transportation Needs  . Lack of Transportation (Medical): No  . Lack of Transportation (Non-Medical): No  Physical Activity: Inactive  . Days of Exercise per Week: 0 days  . Minutes of Exercise per Session: 0 min  Stress: Stress Concern Present  . Feeling of Stress : Rather much  Social Connections: Socially Isolated  . Frequency of Communication with Friends and Family: More than three times a week  . Frequency of Social Gatherings with Friends and Family: More than three times a week  . Attends Religious Services: Never  . Active Member of Clubs or Organizations: No  . Attends Club or Organization Meetings: Never  . Marital Status: Widowed  Intimate Partner Violence: Not At Risk  . Fear of Current or Ex-Partner: No  . Emotionally Abused: No  . Physically Abused: No  . Sexually Abused: No    FAMILY HISTORY:  Family History  Problem Relation Age of Onset  . Hypertension Father   . Heart disease Father   . Cancer Father        lung  . Hypertension Mother   . Cancer Sister        ovarian cancer    CURRENT MEDICATIONS:  Current Outpatient Medications  Medication Sig Dispense Refill  . amLODipine (NORVASC) 5 MG tablet Take 1 tablet (5 mg total) by mouth daily. 90 tablet 1  . [START ON 06/30/2020] atorvastatin (LIPITOR) 20 MG tablet Take 1 tablet (20 mg total) by  mouth daily. 90 tablet 3  . CONSTULOSE 10 GM/15ML solution Take 30 g by mouth daily as needed.     . dicyclomine (BENTYL) 20 MG tablet Take 1 tablet (20 mg total) by mouth every 6 (six) hours. 30 tablet 1  . docusate sodium (COLACE) 100 MG capsule Take 200 mg by mouth 2 (two) times daily as needed for mild constipation (when taking pain medication).    . EPINEPHrine 0.3 mg/0.3 mL IJ SOAJ injection Inject 0.3 mg into the muscle as needed for anaphylaxis. 1 each 0  . gabapentin (NEURONTIN) 300 MG capsule TAKE 1 CAPSULE BY MOUTH THREE TIMES DAILY 90 capsule 6  . ibuprofen (ADVIL) 200 MG   tablet Take 200 mg by mouth every 6 (six) hours as needed.    . lidocaine-prilocaine (EMLA) cream APPLY SMALL AMOUNT TO PORT A CATH SITE AND COVER WITH PLASTIC WRAP ONE HOUR PRIOR TO CHEMOTHERAPY APPOINTMENTS (Patient not taking: Reported on 05/16/2020) 30 g 0  . oxyCODONE-acetaminophen (PERCOCET) 5-325 MG tablet Take 1 tablet by mouth every 8 (eight) hours as needed (pain). 30 tablet 0  . PACLITAXEL IV Inject into the vein every 21 ( twenty-one) days.    . polyethylene glycol (MIRALAX / GLYCOLAX) 17 g packet Take 17 g by mouth 2 (two) times daily as needed.    . predniSONE (DELTASONE) 10 MG tablet Take 6 tablets day one, 5 tablets day two, 4 tablets day three, 3 tablets day four, 2 tablets day five, then 1 tablet day six 21 tablet 0  . prochlorperazine (COMPAZINE) 10 MG tablet Take 1 tablet (10 mg total) by mouth every 6 (six) hours as needed (Nausea or vomiting). (Patient not taking: Reported on 05/16/2020) 30 tablet 1   No current facility-administered medications for this visit.    ALLERGIES:  Allergies  Allergen Reactions  . Carboplatin Other (See Comments)    "body on fire and abdominal pain"  . Nickel Rash    itchy    PHYSICAL EXAM:  Performance status (ECOG): 1 - Symptomatic but completely ambulatory  There were no vitals filed for this visit. Wt Readings from Last 3 Encounters:  05/16/20 234 lb 9.6  oz (106.4 kg)  05/14/20 234 lb (106.1 kg)  04/25/20 233 lb 9.6 oz (106 kg)   Physical Exam Vitals reviewed.  Constitutional:      Appearance: Normal appearance.  Cardiovascular:     Rate and Rhythm: Normal rate and regular rhythm.     Pulses: Normal pulses.     Heart sounds: Normal heart sounds.  Pulmonary:     Effort: Pulmonary effort is normal.     Breath sounds: Normal breath sounds.  Abdominal:     Palpations: Abdomen is soft. There is no hepatomegaly, splenomegaly or mass.     Tenderness: There is abdominal tenderness (mild) in the left upper quadrant.  Neurological:     General: No focal deficit present.     Mental Status: She is alert and oriented to person, place, and time.  Psychiatric:        Mood and Affect: Mood normal.        Behavior: Behavior normal.     LABORATORY DATA:  I have reviewed the labs as listed.  CBC Latest Ref Rng & Units 05/16/2020 04/25/2020 04/04/2020  WBC 4.0 - 10.5 K/uL 7.3 5.8 5.3  Hemoglobin 12.0 - 15.0 g/dL 11.7(L) 11.7(L) 11.6(L)  Hematocrit 36.0 - 46.0 % 36.0 36.3 36.1  Platelets 150 - 400 K/uL 297 274 274   CMP Latest Ref Rng & Units 05/16/2020 04/25/2020 04/04/2020  Glucose 70 - 99 mg/dL 122(H) 110(H) 109(H)  BUN 6 - 20 mg/dL _0 Creatinine 0.44 - 1.00 mg/dL 0.57 0.57 0.66  Sodium 135 - 145 mmol/L 138 141 141  Potassium 3.5 - 5.1 mmol/L 3.9 3.7 3.4(L)  Chloride 98 - 111 mmol/L 107 107 107  CO2 22 - 32 mmol/L _1 Calcium 8.9 - 10.3 mg/dL 9.3 9.5 9.3  Total Protein 6.5 - 8.1 g/dL 6.7 7.0 7.0  Total Bilirubin 0.3 - 1.2 mg/dL 0.6 0.5 0.4  Alkaline Phos 38 - 126 U/L 93 88 81  AST 15 - 41 U/L 22 24  28  ALT 0 - 44 U/L 30 29 36    DIAGNOSTIC IMAGING:  I have independently reviewed the scans and discussed with the patient. US BREAST LTD UNI RIGHT INC AXILLA  Result Date: 05/15/2020 CLINICAL DATA:  Short-term follow-up for probably benign right breast mass. EXAM: DIGITAL DIAGNOSTIC BILATERAL MAMMOGRAM WITH TOMOSYNTHESIS AND CAD;  ULTRASOUND RIGHT BREAST LIMITED TECHNIQUE: Bilateral digital diagnostic mammography and breast tomosynthesis was performed. The images were evaluated with computer-aided detection.; Targeted ultrasound examination of the right breast was performed COMPARISON:  Previous exams. ACR Breast Density Category b: There are scattered areas of fibroglandular density. FINDINGS: No suspicious masses or calcifications are seen in either breast. There is no mammographic evidence of malignancy in either breast. Targeted ultrasound of the outer right breast was performed. The oval circumscribed hypoechoic mass at the 9:30 position (previously labeled as 9 o'clock 6 cm) is unchanged measuring 0.4 x 0.2 x 0.4 cm. There is oval somewhat tubular mass in the right breast at 9 o'clock 4 cm from nipple measuring 1.1 x 0.4 x 0.9 cm and possibly related to a duct. No lymphadenopathy seen in the right axilla. IMPRESSION: 1. Oval/tubular 1.1 cm mass in the right breast the 9 o'clock position possibly related to a duct. 2. Stable appearance of probably benign mass in the right breast at the 9:30 position (previously labeled 9 o'clock 6 cm from nipple). RECOMMENDATION: Recommend ultrasound-guided biopsy of the 1.1 cm mass in the right breast at the 9 position. This will be scheduled for the patient. I have discussed the findings and recommendations with the patient. If applicable, a reminder letter will be sent to the patient regarding the next appointment. BI-RADS CATEGORY  4: Suspicious. Electronically Signed   By: Jennifer  Jarosz M.D.   On: 05/15/2020 11:59   MM DIAG BREAST TOMO BILATERAL  Result Date: 05/15/2020 CLINICAL DATA:  Short-term follow-up for probably benign right breast mass. EXAM: DIGITAL DIAGNOSTIC BILATERAL MAMMOGRAM WITH TOMOSYNTHESIS AND CAD; ULTRASOUND RIGHT BREAST LIMITED TECHNIQUE: Bilateral digital diagnostic mammography and breast tomosynthesis was performed. The images were evaluated with computer-aided  detection.; Targeted ultrasound examination of the right breast was performed COMPARISON:  Previous exams. ACR Breast Density Category b: There are scattered areas of fibroglandular density. FINDINGS: No suspicious masses or calcifications are seen in either breast. There is no mammographic evidence of malignancy in either breast. Targeted ultrasound of the outer right breast was performed. The oval circumscribed hypoechoic mass at the 9:30 position (previously labeled as 9 o'clock 6 cm) is unchanged measuring 0.4 x 0.2 x 0.4 cm. There is oval somewhat tubular mass in the right breast at 9 o'clock 4 cm from nipple measuring 1.1 x 0.4 x 0.9 cm and possibly related to a duct. No lymphadenopathy seen in the right axilla. IMPRESSION: 1. Oval/tubular 1.1 cm mass in the right breast the 9 o'clock position possibly related to a duct. 2. Stable appearance of probably benign mass in the right breast at the 9:30 position (previously labeled 9 o'clock 6 cm from nipple). RECOMMENDATION: Recommend ultrasound-guided biopsy of the 1.1 cm mass in the right breast at the 9 position. This will be scheduled for the patient. I have discussed the findings and recommendations with the patient. If applicable, a reminder letter will be sent to the patient regarding the next appointment. BI-RADS CATEGORY  4: Suspicious. Electronically Signed   By: Jennifer  Jarosz M.D.   On: 05/15/2020 11:59   MM CLIP PLACEMENT RIGHT  Result Date: 05/22/2020 CLINICAL DATA:    Post ultrasound-guided biopsy of a mass in the right breast at the 9 o'clock position. EXAM: DIAGNOSTIC RIGHT MAMMOGRAM POST ULTRASOUND BIOPSY COMPARISON:  Previous exams. FINDINGS: Mammographic images were obtained following ultrasound guided biopsy of a mass in the right breast at the 9 o'clock position. A ribbon shaped biopsy marking clip is present the site of the biopsied mass in the right breast at the 9 o'clock position. IMPRESSION: Ribbon shaped biopsy marking clip at site of  biopsied mass in the right breast at the 9 o'clock position. Final Assessment: Post Procedure Mammograms for Marker Placement Electronically Signed   By: Everlean Alstrom M.D.   On: 05/22/2020 13:26   [place] RT BREAST BX W LOC DEV 1ST LESION IMG BX SPEC US GUIDE  Addendum Date: 05/23/2020   ADDENDUM REPORT: 05/23/2020 11:45 ADDENDUM: PATHOLOGY revealed: A. BREAST, 9:00, MASS, RIGHT, BIOPSY: - Pseudoangiomatous stromal hyperplasia ([place]). - No evidence of malignancy. Pathology results are CONCORDANT with imaging findings, per Dr. Everlean Alstrom. Pathology results and recommendations below were discussed with patient by telephone on 05/23/2020. Patient reported biopsy site within normal limits with slight tenderness at the site, and no significant bruising. Post biopsy care instructions were reviewed, questions were answered and my direct phone number was provided to patient. Patient was instructed to call [facility] Mammography Department if any concerns or questions arise related to the biopsy. Recommendation: Patient to return in twelve months for bilateral diagnostic mammogram for followup of additional probably benign RIGHT breast mass, due April 2023. Patient informed a reminder notice will be sent regarding this appointment and she will need to call mammography site to schedule this appointment. Pathology results reported by Electa Sniff RN on 05/23/2020. Electronically Signed   By: Everlean Alstrom M.D.   On: 05/23/2020 11:45   Result Date: 05/23/2020 CLINICAL DATA:  60 year old female with an indeterminate mass in the right breast at the 9 o'clock position. EXAM: ULTRASOUND GUIDED RIGHT BREAST CORE NEEDLE BIOPSY COMPARISON:  Previous exam(s). PROCEDURE: I met with the patient and we discussed the procedure of ultrasound-guided biopsy, including benefits and alternatives. We discussed the high likelihood of a successful procedure. We discussed the risks of the procedure, including infection,  bleeding, tissue injury, clip migration, and inadequate sampling. Informed written consent was given. The usual time-out protocol was performed immediately prior to the procedure. Lesion quadrant: Upper-outer Using sterile technique and 1% Lidocaine as local anesthetic, under direct ultrasound visualization, a 14 gauge spring-loaded device was used to perform biopsy of the mass in the right breast at the 9 o'clock position using a lateral to medial approach. At the conclusion of the procedure a ribbon shaped tissue marker clip was deployed into the biopsy cavity. Follow up 2 view mammogram was performed and dictated separately. IMPRESSION: Ultrasound guided biopsy of the mass in the right breast at the 9 o'clock position. No apparent complications. Electronically Signed: By: Everlean Alstrom M.D. On: 05/22/2020 13:24     ASSESSMENT:  1. Recurrent endometrial carcinosarcoma: -TAH, BSO, bilateral pelvic and para-aortic lymph node resections on 04/13/2018. -Pathology showed carcinosarcoma (malignant mixed mullerian tumor) is arising in an endometrial type polyp with no myometrial invasion identified. High-grade. 0/39 lymph nodes positive. PT1APN0, FIGO stage Ia. MMR normal. MSI-stable. -CTAP on 12/09/2018 for abdominal pain showed extensive peritoneal carcinomatosis and large soft tissue mass in the pelvis. -Biopsy of the omental mass on 12/28/2018 shows poorly differentiated carcinoma consistent with her prior malignancy. -Carboplatin and paclitaxel started on 12/29/2018. -CT scan on  05/02/2019 showed peritoneal implants in the low central small bowel mesentery and left pelvic sidewall have decreased in size measuring 1.5 x 1.9 cm and 1.7 x 2.2 cm. Soft tissue nodule in the left lateral omentum measures 1 x 1.7 cm with no evidence of metastatic disease. -Continuation of chemotherapy until complete response was recommended. -CT AP on 07/04/2019 showed continued positive response to therapy with scattered  peritoneal metastasis in the pelvis and left omentum decreased in size. No new metastatic disease. -CTAP on 10/24/2019 after 14 cycles showed substantial reduction in size of soft tissue nodules in the pelvis. 1.4 x 0.9 x 1 cm soft tissue density in the inferior serosal margin of the sigmoid colon, previously 1.7 x 1.1 x 1.7 cm. 2 small soft tissue nodules in the mesenteric adipose tissue above the urinary bladder measures 0.8 cm. -She has developed serious allergic reaction during cycle 14 with carboplatin. -I have discussed with Dr. Denman George. Other options include adding ifosfamide to Taxol or Doxil and combination of lenvatinib with pembrolizumab. In the lenvatinib pembrolizumab trial carcinosarcomas werenotincluded. -Single agent paclitaxel started on 11/09/2019, dose reduced by 20% on 01/18/2020. -CTAP on 01/10/2020 with stable small peritoneal soft tissue nodules in the pelvis. Resolution of soft tissue nodularity in the left abdominal omental fat with no new or progressive disease.  2. Breast abnormality: -Mammogram on 05/02/2019 shows BI-RADS Category 0. She reportedly took Covid shot 1 to 2 weeks prior to the mammogram. Further scans rescheduled on 06/21/2019.  3. Peripheral neuropathy: -She reported numbness and occasional pins-and-needles sensation in the bilateral toes, right more than left. This has started about a week ago.   PLAN:  1. Recurrent endometrial carcinosarcoma: -She has tolerated last cycle reasonably well.  She had nausea and diarrhea for 2 days after last chemo. - Reviewed labs which showed normal LFTs and CBC.  This last CA125 was normal. - Recommend proceeding with treatment today with dose reduced paclitaxel. - RTC 3 weeks for follow-up with repeat CT AP with oral contrast and tumor marker.  2.Low back pain/right-sided abdominal pain: -Right hip and lower back pain has improved. - She is taking Percocet 5/325 up to 3 times a week as needed for back  pain. - She has developed left hip pain which is stable.  3. Hypertension: -Continue Norvasc 5 mg daily.  4. Neuropathy: -Continue gabapentin 600 mg at bedtime.  5. Abnormal mammogram: -She had abnormal mammogram on 05/15/2020. - Right breast biopsy at 9:00 showed pseudo angiomatous stromal hyperplasia.   Orders placed this encounter:  No orders of the defined types were placed in this encounter.    [name], MD [place] [phone]   I, Thana Ates, am acting as a scribe for Dr. [name].  I, [name] MD, have reviewed the above documentation for accuracy and completeness, and I agree with the above.

## 2020-06-06 ENCOUNTER — Inpatient Hospital Stay (HOSPITAL_COMMUNITY): Payer: Medicaid Other | Attending: Hematology | Admitting: Hematology

## 2020-06-06 ENCOUNTER — Inpatient Hospital Stay (HOSPITAL_COMMUNITY): Payer: Medicaid Other

## 2020-06-06 ENCOUNTER — Other Ambulatory Visit: Payer: Self-pay

## 2020-06-06 VITALS — BP 109/50 | HR 60 | Temp 97.0°F | Resp 18

## 2020-06-06 DIAGNOSIS — Z7952 Long term (current) use of systemic steroids: Secondary | ICD-10-CM | POA: Diagnosis not present

## 2020-06-06 DIAGNOSIS — G629 Polyneuropathy, unspecified: Secondary | ICD-10-CM | POA: Diagnosis not present

## 2020-06-06 DIAGNOSIS — Z90722 Acquired absence of ovaries, bilateral: Secondary | ICD-10-CM | POA: Insufficient documentation

## 2020-06-06 DIAGNOSIS — I10 Essential (primary) hypertension: Secondary | ICD-10-CM | POA: Insufficient documentation

## 2020-06-06 DIAGNOSIS — Z5111 Encounter for antineoplastic chemotherapy: Secondary | ICD-10-CM | POA: Diagnosis present

## 2020-06-06 DIAGNOSIS — C541 Malignant neoplasm of endometrium: Secondary | ICD-10-CM | POA: Diagnosis not present

## 2020-06-06 DIAGNOSIS — Z79899 Other long term (current) drug therapy: Secondary | ICD-10-CM | POA: Insufficient documentation

## 2020-06-06 DIAGNOSIS — Z95828 Presence of other vascular implants and grafts: Secondary | ICD-10-CM

## 2020-06-06 DIAGNOSIS — C786 Secondary malignant neoplasm of retroperitoneum and peritoneum: Secondary | ICD-10-CM | POA: Insufficient documentation

## 2020-06-06 DIAGNOSIS — Z90710 Acquired absence of both cervix and uterus: Secondary | ICD-10-CM | POA: Diagnosis not present

## 2020-06-06 LAB — COMPREHENSIVE METABOLIC PANEL
ALT: 37 U/L (ref 0–44)
AST: 35 U/L (ref 15–41)
Albumin: 4 g/dL (ref 3.5–5.0)
Alkaline Phosphatase: 100 U/L (ref 38–126)
Anion gap: 9 (ref 5–15)
BUN: 18 mg/dL (ref 6–20)
CO2: 25 mmol/L (ref 22–32)
Calcium: 9.2 mg/dL (ref 8.9–10.3)
Chloride: 104 mmol/L (ref 98–111)
Creatinine, Ser: 0.62 mg/dL (ref 0.44–1.00)
GFR, Estimated: >60 mL/min (ref >60)
Glucose, Bld: 118 mg/dL — ABNORMAL HIGH (ref 70–99)
Potassium: 3.5 mmol/L (ref 3.5–5.1)
Sodium: 138 mmol/L (ref 135–145)
Total Bilirubin: 0.6 mg/dL (ref 0.3–1.2)
Total Protein: 7.3 g/dL (ref 6.5–8.1)

## 2020-06-06 LAB — CBC WITH DIFFERENTIAL/PLATELET
Abs Immature Granulocytes: 0.02 10*3/uL (ref 0.00–0.07)
Basophils Absolute: 0.1 10*3/uL (ref 0.0–0.1)
Basophils Relative: 1 %
Eosinophils Absolute: 0.1 10*3/uL (ref 0.0–0.5)
Eosinophils Relative: 1 %
HCT: 36.2 % (ref 36.0–46.0)
Hemoglobin: 11.9 g/dL — ABNORMAL LOW (ref 12.0–15.0)
Immature Granulocytes: 0 %
Lymphocytes Relative: 35 %
Lymphs Abs: 2.4 10*3/uL (ref 0.7–4.0)
MCH: 29.2 pg (ref 26.0–34.0)
MCHC: 32.9 g/dL (ref 30.0–36.0)
MCV: 88.7 fL (ref 80.0–100.0)
Monocytes Absolute: 0.6 10*3/uL (ref 0.1–1.0)
Monocytes Relative: 9 %
Neutro Abs: 3.7 10*3/uL (ref 1.7–7.7)
Neutrophils Relative %: 54 %
Platelets: 306 10*3/uL (ref 150–400)
RBC: 4.08 MIL/uL (ref 3.87–5.11)
RDW: 15.9 % — ABNORMAL HIGH (ref 11.5–15.5)
WBC: 6.9 10*3/uL (ref 4.0–10.5)
nRBC: 0 % (ref 0.0–0.2)

## 2020-06-06 LAB — MAGNESIUM: Magnesium: 1.7 mg/dL (ref 1.7–2.4)

## 2020-06-06 MED ORDER — SODIUM CHLORIDE 0.9 % IV SOLN
10.0000 mg | Freq: Once | INTRAVENOUS | Status: AC
  Administered 2020-06-06: 10 mg via INTRAVENOUS
  Filled 2020-06-06: qty 10

## 2020-06-06 MED ORDER — FAMOTIDINE 20 MG IN NS 100 ML IVPB
20.0000 mg | Freq: Once | INTRAVENOUS | Status: AC
  Administered 2020-06-06: 20 mg via INTRAVENOUS
  Filled 2020-06-06: qty 20

## 2020-06-06 MED ORDER — DIPHENHYDRAMINE HCL 50 MG/ML IJ SOLN
INTRAMUSCULAR | Status: AC
  Filled 2020-06-06: qty 1

## 2020-06-06 MED ORDER — SODIUM CHLORIDE 0.9 % IV SOLN
140.0000 mg/m2 | Freq: Once | INTRAVENOUS | Status: AC
  Administered 2020-06-06: 288 mg via INTRAVENOUS
  Filled 2020-06-06: qty 48

## 2020-06-06 MED ORDER — OXYCODONE-ACETAMINOPHEN 5-325 MG PO TABS: 1.0000 | ORAL_TABLET | Freq: Two times a day (BID) | ORAL | 0 refills | Status: DC | PRN

## 2020-06-06 MED ORDER — PALONOSETRON HCL INJECTION 0.25 MG/5ML
INTRAVENOUS | Status: AC
  Filled 2020-06-06: qty 5

## 2020-06-06 MED ORDER — SODIUM CHLORIDE 0.9% FLUSH
10.0000 mL | INTRAVENOUS | Status: DC | PRN
Start: 2020-06-06 — End: 2020-06-06
  Administered 2020-06-06: 10 mL

## 2020-06-06 MED ORDER — PROCHLORPERAZINE MALEATE 10 MG PO TABS: 10.0000 mg | ORAL_TABLET | Freq: Four times a day (QID) | ORAL | 3 refills | Status: DC | PRN

## 2020-06-06 MED ORDER — DIPHENHYDRAMINE HCL 50 MG/ML IJ SOLN
50.0000 mg | Freq: Once | INTRAMUSCULAR | Status: AC
  Administered 2020-06-06: 50 mg via INTRAVENOUS

## 2020-06-06 MED ORDER — PALONOSETRON HCL INJECTION 0.25 MG/5ML
0.2500 mg | Freq: Once | INTRAVENOUS | Status: AC
Start: 2020-06-06 — End: 2020-06-06
  Administered 2020-06-06: 0.25 mg via INTRAVENOUS

## 2020-06-06 MED ORDER — SODIUM CHLORIDE 0.9 % IV SOLN: Freq: Once | INTRAVENOUS | Status: AC

## 2020-06-06 MED ORDER — HEPARIN SOD (PORK) LOCK FLUSH 100 UNIT/ML IV SOLN
500.0000 [IU] | Freq: Once | INTRAVENOUS | Status: AC | PRN
  Administered 2020-06-06: 500 [IU]

## 2020-06-06 NOTE — Patient Instructions (Signed)
[place] at [facility] Discharge Instructions  You were seen today by Dr. Delton Coombes. He went over your recent results, and you received treatment. Keep your appointment for your abdominal CT scan, and Dr. Delton Coombes will see you back in 3 weeks for labs and follow up.   Thank you for choosing [place] at [facility] to provide your oncology and hematology care.  To afford each patient quality time with our provider, please arrive at least 15 minutes before your scheduled appointment time.   If you have a lab appointment with the [facility] please come in thru the Main Entrance and check in at the main information desk  You need to re-schedule your appointment should you arrive 10 or more minutes late.  We strive to give you quality time with our providers, and arriving late affects you and other patients whose appointments are after yours.  Also, if you no show three or more times for appointments you may be dismissed from the clinic at the providers discretion.     Again, thank you for choosing [facility].  Our hope is that these requests will decrease the amount of time that you wait before being seen by our physicians.       _____________________________________________________________  Should you have questions after your visit to [facility], please contact our office at [phone] between the hours of 8:00 a.m. and 4:30 p.m.  Voicemails left after 4:00 p.m. will not be returned until the following business day.  For prescription refill requests, have your pharmacy contact our office and allow 72 hours.    Cancer Center Support Programs:   > Cancer Support Group  2nd Tuesday of the month 1pm-2pm, Journey Room

## 2020-06-06 NOTE — Progress Notes (Signed)
Patient presents today for Taxol infusion.  Vital signs within parameters for treatment.  Labs pending.  Patient has no new complaints since last visit.  Labs reviewed and within parameters for treatment.  Message received from [place] and Dr. Delton Coombes patient okay for treatment.  Taxol infusion given today per MD orders.  Stable during infusion without adverse affects.  Vital signs stable.  No complaints at this time.  Discharge from clinic ambulatory in stable condition.  Alert and oriented X 3.  Follow up with [facility] as scheduled.

## 2020-06-06 NOTE — Progress Notes (Signed)
Patient assessed and labs reviewed by Dr Katragadda.  No acute distress noted.  Okay for treatment today.  

## 2020-06-06 NOTE — Progress Notes (Signed)
Patients port flushed without difficulty.  Good blood return noted with no bruising or swelling noted at site.  Stable during access and blood draw.  Patient to remain accessed for treatment. 

## 2020-06-06 NOTE — Patient Instructions (Signed)
[facility]  Discharge Instructions: Thank you for choosing [facility] to provide your oncology and hematology care.  If you have a lab appointment with the Cancer Center, please come in thru the Main Entrance and check in at the main information desk.  Wear comfortable clothing and clothing appropriate for easy access to any Portacath or PICC line.   We strive to give you quality time with your provider. You may need to reschedule your appointment if you arrive late (15 or more minutes).  Arriving late affects you and other patients whose appointments are after yours.  Also, if you miss three or more appointments without notifying the office, you may be dismissed from the clinic at the provider's discretion.      For prescription refill requests, have your pharmacy contact our office and allow 72 hours for refills to be completed.    Today you received the following chemotherapy and/or immunotherapy agents Taxol.   To help prevent nausea and vomiting after your treatment, we encourage you to take your nausea medication as directed.  BELOW ARE SYMPTOMS THAT SHOULD BE REPORTED IMMEDIATELY: *FEVER GREATER THAN 100.4 F (38 C) OR HIGHER *CHILLS OR SWEATING *NAUSEA AND VOMITING THAT IS NOT CONTROLLED WITH YOUR NAUSEA MEDICATION *UNUSUAL SHORTNESS OF BREATH *UNUSUAL BRUISING OR BLEEDING *URINARY PROBLEMS (pain or burning when urinating, or frequent urination) *BOWEL PROBLEMS (unusual diarrhea, constipation, pain near the anus) TENDERNESS IN MOUTH AND THROAT WITH OR WITHOUT PRESENCE OF ULCERS (sore throat, sores in mouth, or a toothache) UNUSUAL RASH, SWELLING OR PAIN  UNUSUAL VAGINAL DISCHARGE OR ITCHING   Items with * indicate a potential emergency and should be followed up as soon as possible or go to the Emergency Department if any problems should occur.  Please show the CHEMOTHERAPY ALERT CARD or IMMUNOTHERAPY ALERT CARD at check-in to the Emergency Department  and triage nurse.  Should you have questions after your visit or need to cancel or reschedule your appointment, please contact [facility] [phone]  and follow the prompts.  Office hours are 8:00 a.m. to 4:30 p.m. Monday - Friday. Please note that voicemails left after 4:00 p.m. may not be returned until the following business day.  We are closed weekends and major holidays. You have access to a nurse at all times for urgent questions. Please call the main number to the clinic [phone] and follow the prompts.  For any non-urgent questions, you may also contact your provider using MyChart. We now offer e-Visits for anyone 18 and older to request care online for non-urgent symptoms. For details visit [email or web address].   Also download the MyChart app! Go to the app store, search "MyChart", open the app, select [facility], and log in with your MyChart username and password.  Due to Covid, a mask is required upon entering the hospital/clinic. If you do not have a mask, one will be given to you upon arrival. For doctor visits, patients may have 1 support person aged 18 or older with them. For treatment visits, patients cannot have anyone with them due to current Covid guidelines and our immunocompromised population.  

## 2020-06-20 ENCOUNTER — Ambulatory Visit (HOSPITAL_COMMUNITY)
Admission: RE | Admit: 2020-06-20 | Discharge: 2020-06-20 | Disposition: A | Discharge status: Home / Self Care | Payer: Medicaid Other | Source: Ambulatory Visit | Attending: Hematology | Admitting: Hematology

## 2020-06-20 ENCOUNTER — Other Ambulatory Visit: Payer: Self-pay

## 2020-06-20 DIAGNOSIS — C541 Malignant neoplasm of endometrium: Secondary | ICD-10-CM | POA: Insufficient documentation

## 2020-06-20 MED ORDER — IOHEXOL 300 MG/ML  SOLN
100.0000 mL | Freq: Once | INTRAMUSCULAR | Status: AC | PRN
  Administered 2020-06-20: 100 mL via INTRAVENOUS

## 2020-06-27 ENCOUNTER — Inpatient Hospital Stay (HOSPITAL_COMMUNITY): Payer: Medicaid Other

## 2020-06-27 ENCOUNTER — Inpatient Hospital Stay (HOSPITAL_BASED_OUTPATIENT_CLINIC_OR_DEPARTMENT_OTHER): Payer: Medicaid Other | Admitting: Hematology and Oncology

## 2020-06-27 ENCOUNTER — Other Ambulatory Visit: Payer: Self-pay

## 2020-06-27 ENCOUNTER — Encounter (HOSPITAL_COMMUNITY): Payer: Self-pay | Admitting: Hematology and Oncology

## 2020-06-27 ENCOUNTER — Inpatient Hospital Stay (HOSPITAL_COMMUNITY): Payer: Medicaid Other | Attending: Hematology

## 2020-06-27 VITALS — BP 131/67 | HR 60 | Temp 96.9°F | Resp 18

## 2020-06-27 DIAGNOSIS — Z7952 Long term (current) use of systemic steroids: Secondary | ICD-10-CM | POA: Insufficient documentation

## 2020-06-27 DIAGNOSIS — Z79899 Other long term (current) drug therapy: Secondary | ICD-10-CM | POA: Insufficient documentation

## 2020-06-27 DIAGNOSIS — E785 Hyperlipidemia, unspecified: Secondary | ICD-10-CM | POA: Insufficient documentation

## 2020-06-27 DIAGNOSIS — C541 Malignant neoplasm of endometrium: Secondary | ICD-10-CM

## 2020-06-27 DIAGNOSIS — D6481 Anemia due to antineoplastic chemotherapy: Secondary | ICD-10-CM | POA: Diagnosis not present

## 2020-06-27 DIAGNOSIS — Z90722 Acquired absence of ovaries, bilateral: Secondary | ICD-10-CM | POA: Insufficient documentation

## 2020-06-27 DIAGNOSIS — I10 Essential (primary) hypertension: Secondary | ICD-10-CM | POA: Diagnosis not present

## 2020-06-27 DIAGNOSIS — T451X5A Adverse effect of antineoplastic and immunosuppressive drugs, initial encounter: Secondary | ICD-10-CM | POA: Insufficient documentation

## 2020-06-27 DIAGNOSIS — Z95828 Presence of other vascular implants and grafts: Secondary | ICD-10-CM

## 2020-06-27 DIAGNOSIS — Z5111 Encounter for antineoplastic chemotherapy: Secondary | ICD-10-CM | POA: Insufficient documentation

## 2020-06-27 DIAGNOSIS — G620 Drug-induced polyneuropathy: Secondary | ICD-10-CM

## 2020-06-27 DIAGNOSIS — Z90710 Acquired absence of both cervix and uterus: Secondary | ICD-10-CM | POA: Insufficient documentation

## 2020-06-27 DIAGNOSIS — C786 Secondary malignant neoplasm of retroperitoneum and peritoneum: Secondary | ICD-10-CM | POA: Diagnosis present

## 2020-06-27 LAB — COMPREHENSIVE METABOLIC PANEL
ALT: 22 U/L (ref 0–44)
AST: 18 U/L (ref 15–41)
Albumin: 3.6 g/dL (ref 3.5–5.0)
Alkaline Phosphatase: 89 U/L (ref 38–126)
Anion gap: 7 (ref 5–15)
BUN: 14 mg/dL (ref 6–20)
CO2: 26 mmol/L (ref 22–32)
Calcium: 8.9 mg/dL (ref 8.9–10.3)
Chloride: 107 mmol/L (ref 98–111)
Creatinine, Ser: 0.56 mg/dL (ref 0.44–1.00)
GFR, Estimated: >60 mL/min (ref >60)
Glucose, Bld: 104 mg/dL — ABNORMAL HIGH (ref 70–99)
Potassium: 3.4 mmol/L — ABNORMAL LOW (ref 3.5–5.1)
Sodium: 140 mmol/L (ref 135–145)
Total Bilirubin: 0.4 mg/dL (ref 0.3–1.2)
Total Protein: 6.6 g/dL (ref 6.5–8.1)

## 2020-06-27 LAB — CBC WITH DIFFERENTIAL/PLATELET
Abs Immature Granulocytes: 0.03 10*3/uL (ref 0.00–0.07)
Basophils Absolute: 0.1 10*3/uL (ref 0.0–0.1)
Basophils Relative: 1 %
Eosinophils Absolute: 0.1 10*3/uL (ref 0.0–0.5)
Eosinophils Relative: 2 %
HCT: 35.6 % — ABNORMAL LOW (ref 36.0–46.0)
Hemoglobin: 11.5 g/dL — ABNORMAL LOW (ref 12.0–15.0)
Immature Granulocytes: 0 %
Lymphocytes Relative: 33 %
Lymphs Abs: 2.3 10*3/uL (ref 0.7–4.0)
MCH: 28.8 pg (ref 26.0–34.0)
MCHC: 32.3 g/dL (ref 30.0–36.0)
MCV: 89 fL (ref 80.0–100.0)
Monocytes Absolute: 0.6 10*3/uL (ref 0.1–1.0)
Monocytes Relative: 9 %
Neutro Abs: 3.8 10*3/uL (ref 1.7–7.7)
Neutrophils Relative %: 55 %
Platelets: 271 10*3/uL (ref 150–400)
RBC: 4 MIL/uL (ref 3.87–5.11)
RDW: 15.7 % — ABNORMAL HIGH (ref 11.5–15.5)
WBC: 6.9 10*3/uL (ref 4.0–10.5)
nRBC: 0 % (ref 0.0–0.2)

## 2020-06-27 LAB — MAGNESIUM: Magnesium: 1.6 mg/dL — ABNORMAL LOW (ref 1.7–2.4)

## 2020-06-27 MED ORDER — PALONOSETRON HCL INJECTION 0.25 MG/5ML
0.2500 mg | Freq: Once | INTRAVENOUS | Status: AC
  Administered 2020-06-27: 0.25 mg via INTRAVENOUS
  Filled 2020-06-27: qty 5

## 2020-06-27 MED ORDER — SODIUM CHLORIDE 0.9 % IV SOLN
Freq: Once | INTRAVENOUS | Status: AC
Start: 2020-06-27 — End: 2020-06-27

## 2020-06-27 MED ORDER — SODIUM CHLORIDE 0.9 % IV SOLN
140.0000 mg/m2 | Freq: Once | INTRAVENOUS | Status: AC
  Administered 2020-06-27: 288 mg via INTRAVENOUS
  Filled 2020-06-27: qty 48

## 2020-06-27 MED ORDER — SODIUM CHLORIDE 0.9% FLUSH
10.0000 mL | INTRAVENOUS | Status: DC | PRN
  Administered 2020-06-27: 10 mL

## 2020-06-27 MED ORDER — HEPARIN SOD (PORK) LOCK FLUSH 100 UNIT/ML IV SOLN
500.0000 [IU] | Freq: Once | INTRAVENOUS | Status: AC | PRN
  Administered 2020-06-27: 500 [IU]

## 2020-06-27 MED ORDER — DIPHENHYDRAMINE HCL 50 MG/ML IJ SOLN
50.0000 mg | Freq: Once | INTRAMUSCULAR | Status: AC
Start: 2020-06-27 — End: 2020-06-27
  Administered 2020-06-27: 50 mg via INTRAVENOUS
  Filled 2020-06-27: qty 1

## 2020-06-27 MED ORDER — SODIUM CHLORIDE 0.9 % IV SOLN
10.0000 mg | Freq: Once | INTRAVENOUS | Status: AC
  Administered 2020-06-27: 10 mg via INTRAVENOUS
  Filled 2020-06-27: qty 10

## 2020-06-27 MED ORDER — FAMOTIDINE 20 MG IN NS 100 ML IVPB
20.0000 mg | Freq: Once | INTRAVENOUS | Status: AC
Start: 2020-06-27 — End: 2020-06-27
  Administered 2020-06-27: 20 mg via INTRAVENOUS
  Filled 2020-06-27: qty 20

## 2020-06-27 NOTE — Patient Instructions (Signed)
[facility]  Discharge Instructions: Thank you for choosing [facility] to provide your oncology and hematology care.  If you have a lab appointment with the [facility], please come in thru the Main Entrance and check in at the main information desk.  Wear comfortable clothing and clothing appropriate for easy access to any Portacath or PICC line.   We strive to give you quality time with your provider. You may need to reschedule your appointment if you arrive late (15 or more minutes).  Arriving late affects you and other patients whose appointments are after yours.  Also, if you miss three or more appointments without notifying the office, you may be dismissed from the clinic at the provider's discretion.      For prescription refill requests, have your pharmacy contact our office and allow 72 hours for refills to be completed.    Today you received the following chemotherapy and/or immunotherapy agents taxol.  Return as scheduled.  Please call the clinic if you have any questions or concerns.    To help prevent nausea and vomiting after your treatment, we encourage you to take your nausea medication as directed.  BELOW ARE SYMPTOMS THAT SHOULD BE REPORTED IMMEDIATELY: . *FEVER GREATER THAN 100.4 F (38 C) OR HIGHER . *CHILLS OR SWEATING . *NAUSEA AND VOMITING THAT IS NOT CONTROLLED WITH YOUR NAUSEA MEDICATION . *UNUSUAL SHORTNESS OF BREATH . *UNUSUAL BRUISING OR BLEEDING . *URINARY PROBLEMS (pain or burning when urinating, or frequent urination) . *BOWEL PROBLEMS (unusual diarrhea, constipation, pain near the anus) . TENDERNESS IN MOUTH AND THROAT WITH OR WITHOUT PRESENCE OF ULCERS (sore throat, sores in mouth, or a toothache) . UNUSUAL RASH, SWELLING OR PAIN  . UNUSUAL VAGINAL DISCHARGE OR ITCHING   Items with * indicate a potential emergency and should be followed up as soon as possible or go to the Emergency Department if any problems should  occur.  Please show the CHEMOTHERAPY ALERT CARD or IMMUNOTHERAPY ALERT CARD at check-in to the Emergency Department and triage nurse.  Should you have questions after your visit or need to cancel or reschedule your appointment, please contact [facility] [phone]  and follow the prompts.  Office hours are 8:00 a.m. to 4:30 p.m. Monday - Friday. Please note that voicemails left after 4:00 p.m. may not be returned until the following business day.  We are closed weekends and major holidays. You have access to a nurse at all times for urgent questions. Please call the main number to the clinic [phone] and follow the prompts.  For any non-urgent questions, you may also contact your provider using MyChart. We now offer e-Visits for anyone 88 and older to request care online for non-urgent symptoms. For details visit [email or web address].   Also download the MyChart app! Go to the app store, search "MyChart", open the app, select [facility], and log in with your MyChart username and password.  Due to Covid, a mask is required upon entering the hospital/clinic. If you do not have a mask, one will be given to you upon arrival. For doctor visits, patients may have 1 support person aged 90 or older with them. For treatment visits, patients cannot have anyone with them due to current Covid guidelines and our immunocompromised population.   Paclitaxel injection What is this medicine? PACLITAXEL (PAK li TAX el) is a chemotherapy drug. It targets fast dividing cells, like cancer cells, and causes these cells to die. This medicine is used to  treat ovarian cancer, breast cancer, lung cancer, Kaposi's sarcoma, and other cancers. This medicine may be used for other purposes; ask your health care provider or pharmacist if you have questions. COMMON BRAND NAME(S): Onxol, Taxol What should I tell my health care provider before I take this medicine? They need to know if you have any of these  conditions:  history of irregular heartbeat  liver disease  low blood counts, like low white cell, platelet, or red cell counts  lung or breathing disease, like asthma  tingling of the fingers or toes, or other nerve disorder  an unusual or allergic reaction to paclitaxel, alcohol, polyoxyethylated castor oil, other chemotherapy, other medicines, foods, dyes, or preservatives  pregnant or trying to get pregnant  breast-feeding How should I use this medicine? This drug is given as an infusion into a vein. It is administered in a hospital or clinic by a specially trained health care professional. Talk to your pediatrician regarding the use of this medicine in children. Special care may be needed. Overdosage: If you think you have taken too much of this medicine contact a poison control center or emergency room at once. NOTE: This medicine is only for you. Do not share this medicine with others. What if I miss a dose? It is important not to miss your dose. Call your doctor or health care professional if you are unable to keep an appointment. What may interact with this medicine? Do not take this medicine with any of the following medications:  live virus vaccines This medicine may also interact with the following medications:  antiviral medicines for hepatitis, HIV or AIDS  certain antibiotics like erythromycin and clarithromycin  certain medicines for fungal infections like ketoconazole and itraconazole  certain medicines for seizures like carbamazepine, phenobarbital, phenytoin  gemfibrozil  nefazodone  rifampin  St. John's wort This list may not describe all possible interactions. Give your health care provider a list of all the medicines, herbs, non-prescription drugs, or dietary supplements you use. Also tell them if you smoke, drink alcohol, or use illegal drugs. Some items may interact with your medicine. What should I watch for while using this medicine? Your  condition will be monitored carefully while you are receiving this medicine. You will need important blood work done while you are taking this medicine. This medicine can cause serious allergic reactions. To reduce your risk you will need to take other medicine(s) before treatment with this medicine. If you experience allergic reactions like skin rash, itching or hives, swelling of the face, lips, or tongue, tell your doctor or health care professional right away. In some cases, you may be given additional medicines to help with side effects. Follow all directions for their use. This drug may make you feel generally unwell. This is not uncommon, as chemotherapy can affect healthy cells as well as cancer cells. Report any side effects. Continue your course of treatment even though you feel ill unless your doctor tells you to stop. Call your doctor or health care professional for advice if you get a fever, chills or sore throat, or other symptoms of a cold or flu. Do not treat yourself. This drug decreases your body's ability to fight infections. Try to avoid being around people who are sick. This medicine may increase your risk to bruise or bleed. Call your doctor or health care professional if you notice any unusual bleeding. Be careful brushing and flossing your teeth or using a toothpick because you may get an infection or  bleed more easily. If you have any dental work done, tell your dentist you are receiving this medicine. Avoid taking products that contain aspirin, acetaminophen, ibuprofen, naproxen, or ketoprofen unless instructed by your doctor. These medicines may hide a fever. Do not become pregnant while taking this medicine. Women should inform their doctor if they wish to become pregnant or think they might be pregnant. There is a potential for serious side effects to an unborn child. Talk to your health care professional or pharmacist for more information. Do not breast-feed an infant while  taking this medicine. Men are advised not to father a child while receiving this medicine. This product may contain alcohol. Ask your pharmacist or healthcare provider if this medicine contains alcohol. Be sure to tell all healthcare providers you are taking this medicine. Certain medicines, like metronidazole and disulfiram, can cause an unpleasant reaction when taken with alcohol. The reaction includes flushing, headache, nausea, vomiting, sweating, and increased thirst. The reaction can last from 30 minutes to several hours. What side effects may I notice from receiving this medicine? Side effects that you should report to your doctor or health care professional as soon as possible:  allergic reactions like skin rash, itching or hives, swelling of the face, lips, or tongue  breathing problems  changes in vision  fast, irregular heartbeat  high or low blood pressure  mouth sores  pain, tingling, numbness in the hands or feet  signs of decreased platelets or bleeding - bruising, pinpoint red spots on the skin, black, tarry stools, blood in the urine  signs of decreased red blood cells - unusually weak or tired, feeling faint or lightheaded, falls  signs of infection - fever or chills, cough, sore throat, pain or difficulty passing urine  signs and symptoms of liver injury like dark yellow or brown urine; general ill feeling or flu-like symptoms; light-colored stools; loss of appetite; nausea; right upper belly pain; unusually weak or tired; yellowing of the eyes or skin  swelling of the ankles, feet, hands  unusually slow heartbeat Side effects that usually do not require medical attention (report to your doctor or health care professional if they continue or are bothersome):  diarrhea  hair loss  loss of appetite  muscle or joint pain  nausea, vomiting  pain, redness, or irritation at site where injected  tiredness This list may not describe all possible side effects.  Call your doctor for medical advice about side effects. You may report side effects to FDA at 1-800-FDA-1088. Where should I keep my medicine? This drug is given in a hospital or clinic and will not be stored at home. NOTE: This sheet is a summary. It may not cover all possible information. If you have questions about this medicine, talk to your doctor, pharmacist, or health care provider.  2021 [employer] Standard (2018-12-08 13:37:23)

## 2020-06-27 NOTE — Progress Notes (Signed)
Pt here for taxol today. Magnesium 1.6, potassium 3.4. okay for treatment today per Dr Alvy Bimler.   Tolerated treatment well today without incidence.  Stable during and after treatment.  Vital signs stable prior to discharge.  AVS reviewed. Discharged in stable condition.

## 2020-06-27 NOTE — Assessment & Plan Note (Signed)
I have reviewed imaging studies with the patient She has stable disease control Overall, she tolerated treatment well except for neuropathy We will continue treatment indefinitely

## 2020-06-27 NOTE — Progress Notes (Signed)
[facility] progress notes  Patient Care Team: Perlie Mayo, NP as PCP - General (Family Medicine) Donetta Potts, RN as Oncology Nurse Navigator [name], MD as Medical Oncologist (Oncology)  CHIEF COMPLAINTS/PURPOSE OF VISIT:  Endometrial cancer, on chemotherapy  HISTORY OF PRESENTING ILLNESS:  Jessica [name] 60 y.o. female was because her oncologist is not present today She returns for chemotherapy and review of imaging study results Neuropathy stable She denies changes in bowel habits or other new side effects She has mild occasional nausea for 2 to 3 days after treatment but well controlled with antiemetics  I reviewed the patient's records extensive and collaborated the history with the patient. Summary of her history is as follows: Oncology History  Endometrial cancer ([place])  03/31/2018 Initial Diagnosis   Endometrial cancer ([place])   12/29/2018 -  Chemotherapy    Patient is on Treatment Plan: UTERINE CARBOPLATIN AUC 6 / PACLITAXEL Q21D      06/06/2020 Miscellaneous   Summary of oncologic history from Dr. Delton Coombes   1.  Recurrent endometrial carcinosarcoma: -TAH, BSO, bilateral pelvic and para-aortic lymph node resections on 04/13/2018. -Pathology showed carcinosarcoma (malignant mixed mullerian tumor) is arising in an endometrial type polyp with no myometrial invasion identified.  High-grade.  0/39 lymph nodes positive.  PT1APN0, FIGO stage Ia.  MMR normal.  MSI-stable. -CTAP on 12/09/2018 for abdominal pain showed extensive peritoneal carcinomatosis and large soft tissue mass in the pelvis. -Biopsy of the omental mass on 12/28/2018 shows poorly differentiated carcinoma consistent with her prior malignancy. -Carboplatin and paclitaxel started on 12/29/2018. -CT scan on 05/02/2019 showed peritoneal implants in the low central small bowel mesentery and left pelvic sidewall have decreased in size measuring 1.5 x 1.9 cm and 1.7 x 2.2 cm.   Soft tissue nodule in the left lateral omentum measures 1 x 1.7 cm with no evidence of metastatic disease. -Continuation of chemotherapy until complete response was recommended. -CT AP on 07/04/2019 showed continued positive response to therapy with scattered peritoneal metastasis in the pelvis and left omentum decreased in size.  No new metastatic disease. -CTAP on 10/24/2019 after 14 cycles showed substantial reduction in size of soft tissue nodules in the pelvis.  1.4 x 0.9 x 1 cm soft tissue density in the inferior serosal margin of the sigmoid colon, previously 1.7 x 1.1 x 1.7 cm.  2 small soft tissue nodules in the mesenteric adipose tissue above the urinary bladder measures 0.8 cm. -She has developed serious allergic reaction during cycle 14 with carboplatin. -I have discussed with Dr. Denman George.  Other options include adding ifosfamide to Taxol or Doxil and combination of lenvatinib with pembrolizumab.  In the lenvatinib pembrolizumab trial carcinosarcomas were not included. -Single agent paclitaxel started on 11/09/2019, dose reduced by 20% on 01/18/2020. -CTAP on 01/10/2020 with stable small peritoneal soft tissue nodules in the pelvis.  Resolution of soft tissue nodularity in the left abdominal omental fat with no new or progressive disease.     06/20/2020 Imaging   1. Stable examination status post hysterectomy without suspicious enhancing soft tissue nodularity at the vaginal cuff and no significant change in the multiple small soft tissue nodules in the pelvis likely representing treated tumor. No new or progressive findings. 2. Cholelithiasis including a large gallstone in the neck of the gallbladder but without CT evidence of acute cholecystitis. 3. Small hiatal hernia. 4. Aortic atherosclerosis.       MEDICAL HISTORY:  Past Medical History:  Diagnosis Date  .  Cancer ([place])    Phreesia 09/05/2019  . Elevated blood pressure reading   . Endometrial cancer ([place])   . Hyperlipidemia   .  Hypertension   . PMB (postmenopausal bleeding)   . Port-A-Cath in place 12/24/2018    SURGICAL HISTORY: Past Surgical History:  Procedure Laterality Date  . ABDOMINAL HYSTERECTOMY N/A    Phreesia 09/05/2019  . IR IMAGING GUIDED PORT INSERTION  12/28/2018  . IR RADIOLOGIST EVAL & MGMT  06/02/2018  . IR RADIOLOGIST EVAL & MGMT  06/16/2018  . NECK SURGERY  2000   spinal surgery , titanium rod in place   . ROBOTIC ASSISTED TOTAL HYSTERECTOMY WITH BILATERAL SALPINGO OOPHERECTOMY N/A 04/13/2018   Procedure: XI ROBOTIC ASSISTED TOTAL HYSTERECTOMY WITH BILATERAL SALPINGO OOPHORECTOMY;  Surgeon: Everitt Amber, MD;  Location: [facility];  Service: Gynecology;  Laterality: N/A;  . ROBOTIC PELVIC AND PARA-AORTIC LYMPH NODE DISSECTION N/A 04/13/2018   Procedure: XI ROBOTIC PELVIC LYMPHADECTOMY AND PARA-AORTIC LYMPH NODE DISSECTION;  Surgeon: Everitt Amber, MD;  Location: [facility];  Service: Gynecology;  Laterality: N/A;    SOCIAL HISTORY: Social History   Socioeconomic History  . Marital status: Widowed    Spouse name: Not on file  . Number of children: 3  . Years of education: Not on file  . Highest education level: Not on file  Occupational History  . 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 Smoking status: Never Smoker  . Smokeless tobacco: Never Used  Vaping Use  . Vaping Use: Never used  Substance and Sexual Activity  . Alcohol use: Yes    Alcohol/week: 4.0 standard drinks    Types: 4 Glasses of wine per week    Comment: weekends  . Drug use: Never  . Sexual activity: Not Currently  Other Topics Concern  . Not on file  Social History Narrative   Lives with daughter and granddaughter here in [place], moved 3 years ago      Cats: Zoe-32 years old, Eduard Clos, and Horald Pollen      Enjoys: walking when she can tolerate- spending time with granddaughter (her life)      Diet: eats all food groups   Caffeine: 1-2 cups at times, but does not tolerate it all the time   Water: 160  oz daily      Wears seat belt    Does not use phone while driving    Oceanographer at home   Fire extinguisher-no   No weapons   Social Determinants of Health   Financial Resource Strain: [name]   . Difficulty of Paying Living Expenses: Not hard at all  Food Insecurity: No Food Insecurity  . Worried About Charity fundraiser in the Last Year: Never true  . Ran Out of Food in the Last Year: Never true  Transportation Needs: No Transportation Needs  . Lack of Transportation (Medical): No  . Lack of Transportation (Non-Medical): No  Physical Activity: Inactive  . Days of Exercise per Week: 0 days  . Minutes of Exercise per Session: 0 min  Stress: Stress Concern Present  . Feeling of Stress : Rather much  Social Connections: Socially Isolated  . Frequency of Communication with Friends and Family: More than three times a week  . Frequency of Social Gatherings with Friends and Family: More than three times a week  . Attends Religious Services: Never  . Active Member of Clubs or Organizations: No  . Attends Archivist Meetings:  Never  . Marital Status: Widowed  Intimate Partner Violence: Not At Risk  . Fear of Current or Ex-Partner: No  . Emotionally Abused: No  . Physically Abused: No  . Sexually Abused: No    FAMILY HISTORY: Family History  Problem Relation Age of Onset  . Hypertension Father   . Heart disease Father   . Cancer Father        lung  . Hypertension Mother   . Cancer Sister        ovarian cancer    ALLERGIES:  is allergic to carboplatin and nickel.  MEDICATIONS:  Current Outpatient Medications  Medication Sig Dispense Refill  . amLODipine (NORVASC) 5 MG tablet Take 1 tablet (5 mg total) by mouth daily. 90 tablet 1  . [START ON 06/30/2020] atorvastatin (LIPITOR) 20 MG tablet Take 1 tablet (20 mg total) by mouth daily. 90 tablet 3  . CONSTULOSE 10 GM/15ML solution Take 30 g by mouth daily as needed.     . dicyclomine (BENTYL) 20 MG tablet  Take 1 tablet (20 mg total) by mouth every 6 (six) hours. 30 tablet 1  . docusate sodium (COLACE) 100 MG capsule Take 200 mg by mouth 2 (two) times daily as needed for mild constipation (when taking pain medication).    . gabapentin (NEURONTIN) 300 MG capsule TAKE 1 CAPSULE BY MOUTH THREE TIMES DAILY 90 capsule 6  . ibuprofen (ADVIL) 200 MG tablet Take 200 mg by mouth every 6 (six) hours as needed.    Marland Kitchen PACLITAXEL IV Inject into the vein every 21 ( twenty-one) days.    . polyethylene glycol (MIRALAX / GLYCOLAX) 17 g packet Take 17 g by mouth 2 (two) times daily as needed.    . predniSONE (DELTASONE) 10 MG tablet Take 6 tablets day one, 5 tablets day two, 4 tablets day three, 3 tablets day four, 2 tablets day five, then 1 tablet day six 21 tablet 0  . EPINEPHrine 0.3 mg/0.3 mL IJ SOAJ injection Inject 0.3 mg into the muscle as needed for anaphylaxis. (Patient not taking: Reported on 06/27/2020) 1 each 0  . lidocaine-prilocaine (EMLA) cream APPLY SMALL AMOUNT TO PORT A CATH SITE AND COVER WITH PLASTIC WRAP ONE HOUR PRIOR TO CHEMOTHERAPY APPOINTMENTS (Patient not taking: Reported on 06/27/2020) 30 g 0  . oxyCODONE-acetaminophen (PERCOCET) 5-325 MG tablet Take 1 tablet by mouth every 12 (twelve) hours as needed (pain). (Patient not taking: Reported on 06/27/2020) 60 tablet 0  . prochlorperazine (COMPAZINE) 10 MG tablet Take 1 tablet (10 mg total) by mouth every 6 (six) hours as needed (Nausea or vomiting). (Patient not taking: Reported on 06/27/2020) 60 tablet 3   No current facility-administered medications for this visit.    REVIEW OF SYSTEMS:   Constitutional: Denies fevers, chills or abnormal night sweats Eyes: Denies blurriness of vision, double vision or watery eyes Ears, nose, mouth, throat, and face: Denies mucositis or sore throat Respiratory: Denies cough, dyspnea or wheezes Cardiovascular: Denies palpitation, chest discomfort or lower extremity swelling Skin: Denies abnormal skin  rashes Lymphatics: Denies new lymphadenopathy or easy bruising Behavioral/Psych: Mood is stable, no new changes  All other systems were reviewed with the patient and are negative.  PHYSICAL EXAMINATION: ECOG PERFORMANCE STATUS: 1 - Symptomatic but completely ambulatory  Vitals:   06/27/20 0822  BP: 132/66  Pulse: 63  Resp: 20  Temp: (!) 96.9 F (36.1 C)  SpO2: 98%   Filed Weights   06/27/20 0822  Weight: 238 lb 12.8 oz (108.3  kg)    GENERAL:alert, no distress and comfortable SKIN: skin color, texture, turgor are normal, no rashes or significant lesions EYES: normal, conjunctiva are pink and non-injected, sclera clear OROPHARYNX:no exudate, normal lips, buccal mucosa, and tongue  NECK: supple, thyroid normal size, non-tender, without nodularity LYMPH:  no palpable lymphadenopathy in the cervical, axillary or inguinal LUNGS: clear to auscultation and percussion with normal breathing effort HEART: regular rate & rhythm and no murmurs without lower extremity edema ABDOMEN:abdomen soft, non-tender and normal bowel sounds Musculoskeletal:no cyanosis of digits and no clubbing  PSYCH: alert & oriented x 3 with fluent speech NEURO: no focal motor/sensory deficits  LABORATORY DATA:  I have reviewed the data as listed Lab Results  Component Value Date   WBC 6.9 06/27/2020   HGB 11.5 (L) 06/27/2020   HCT 35.6 (L) 06/27/2020   MCV 89.0 06/27/2020   PLT 271 06/27/2020   Recent Labs    08/24/19 0823 09/14/19 0847 10/05/19 0825 10/26/19 0826 04/25/20 0816 05/16/20 0817 06/06/20 0820  NA 138 137 141   < > 141 138 138  K 3.8 3.4* 3.6   < > 3.7 3.9 3.5  CL 103 106 108   < > 107 107 104  CO2 _0 < > _1 GLUCOSE 111* 164* 109*   < > 110* 122* 118*  BUN _2 < > _3 CREATININE 0.61 0.55 0.61   < > 0.57 0.57 0.62  CALCIUM 9.2 8.7* 8.8*   < > 9.5 9.3 9.2  GFRNONAA >60 >60 >60   < > >60 >60 >60  GFRAA >60 >60 >60  --   --   --   --   PROT 7.2 6.9 6.9    < > 7.0 6.7 7.3  ALBUMIN 4.2 4.0 3.9   < > 4.1 3.8 4.0  AST _4 < > 24 22 35  ALT _5 < > 29 30 37  ALKPHOS 107 106 103   < > 88 93 100  BILITOT 0.5 0.3 0.5   < > 0.5 0.6 0.6   < > = values in this interval not displayed.    RADIOGRAPHIC STUDIES: I have reviewed imaging studies with the patient I have personally reviewed the radiological images as listed and agreed with the findings in the report. CT Abdomen Pelvis W Contrast  Result Date: 06/20/2020 CLINICAL DATA:  History of endometrial cancer, restaging. EXAM: CT ABDOMEN AND PELVIS WITH CONTRAST TECHNIQUE: Multidetector CT imaging of the abdomen and pelvis was performed using the standard protocol following bolus administration of intravenous contrast. CONTRAST:  147m OMNIPAQUE IOHEXOL 300 MG/ML  SOLN COMPARISON:  Multiple priors including most recent CT abdomen and pelvis March 23, 2020 FINDINGS: Lower chest: No acute abnormality.  Small hiatal hernia. Hepatobiliary: No suspicious hepatic lesions. Multiple cholelithiasis are again visualized measuring up to 1.4 cm in the gallbladder neck with similar distension of the gallbladder. No pericholecystic fluid. No biliary ductal dilation. Pancreas: Within normal limits. Spleen: Within normal limits. Adrenals/Urinary Tract: Adrenal glands are unremarkable. Kidneys are normal, without renal calculi, solid enhancing lesion, or hydronephrosis. Bladder is unremarkable for degree of distension. Stomach/Bowel: Small hiatal hernia otherwise the stomach is grossly unremarkable. Radiopaque enteric contrast traverses the ascending colon. No pathologic dilation of small bowel. The appendix and terminal ileum are unremarkable. No suspicious colonic wall thickening or mass like lesions. Vascular/Lymphatic: Aortic and branch vessel atherosclerosis  without abdominal aortic aneurysm. No gastrohepatic or hepatoduodenal ligament lymphadenopathy. No retroperitoneal or mesenteric lymphadenopathy. No pelvic  sidewall lymphadenopathy. No groin lymphadenopathy. Reproductive: Uterus is surgically absent. No suspicious enhancing soft tissue nodularity at the vaginal cuff. No suspicious adnexal masses. Other: The multiple 1 cm smaller soft tissue nodules throughout the pelvis are stable, with the previously indexed largest nodule along the left side of the mid sigmoid colon measuring 10 mm on image 74/2, unchanged. No abdominopelvic ascites. No omental caking. Musculoskeletal: Multilevel degenerative changes spine. No aggressive lytic or blastic lesion of bone. IMPRESSION: 1. Stable examination status post hysterectomy without suspicious enhancing soft tissue nodularity at the vaginal cuff and no significant change in the multiple small soft tissue nodules in the pelvis likely representing treated tumor. No new or progressive findings. 2. Cholelithiasis including a large gallstone in the neck of the gallbladder but without CT evidence of acute cholecystitis. 3. Small hiatal hernia. 4. Aortic atherosclerosis. Aortic Atherosclerosis (ICD10-I70.0). Electronically Signed   By: Dahlia Bailiff MD   On: 06/20/2020 13:34    ASSESSMENT & PLAN:  Endometrial cancer ([place]) I have reviewed imaging studies with the patient She has stable disease control Overall, she tolerated treatment well except for neuropathy We will continue treatment indefinitely   Anemia due to antineoplastic chemotherapy This is likely due to recent treatment. The patient denies recent history of bleeding such as epistaxis, hematuria or hematochezia. She is asymptomatic from the anemia. I will observe for now.  She does not require transfusion now. I will continue the chemotherapy at current dose without dosage adjustment.  If the anemia gets progressive worse in the future, I might have to delay her treatment or adjust the chemotherapy dose.   Peripheral neuropathy due to chemotherapy ([facility]) Neuropathy is stable We discussed trial of cryotherapy in  the future She will continue gabapentin as prescribed   No orders of the defined types were placed in this encounter.   All questions were answered. The patient knows to call the clinic with any problems, questions or concerns. The total time spent in the appointment was 20 minutes encounter with patients including review of chart and various tests results, discussions about plan of care and coordination of care plan   Heath Lark, MD 06/27/2020 9:01 AM

## 2020-06-27 NOTE — Assessment & Plan Note (Signed)

## 2020-06-27 NOTE — Assessment & Plan Note (Signed)
Neuropathy is stable We discussed trial of cryotherapy in the future She will continue gabapentin as prescribed

## 2020-06-30 ENCOUNTER — Other Ambulatory Visit: Payer: Self-pay | Admitting: Family Medicine

## 2020-06-30 DIAGNOSIS — E785 Hyperlipidemia, unspecified: Secondary | ICD-10-CM

## 2020-07-02 ENCOUNTER — Other Ambulatory Visit: Payer: Self-pay | Admitting: Family Medicine

## 2020-07-02 DIAGNOSIS — E785 Hyperlipidemia, unspecified: Secondary | ICD-10-CM

## 2020-07-18 ENCOUNTER — Encounter (HOSPITAL_COMMUNITY): Payer: Self-pay | Admitting: Hematology and Oncology

## 2020-07-18 ENCOUNTER — Inpatient Hospital Stay (HOSPITAL_BASED_OUTPATIENT_CLINIC_OR_DEPARTMENT_OTHER): Payer: Medicaid Other | Admitting: Hematology and Oncology

## 2020-07-18 ENCOUNTER — Inpatient Hospital Stay (HOSPITAL_COMMUNITY): Payer: Medicaid Other

## 2020-07-18 VITALS — BP 130/80 | HR 72 | Temp 98.3°F | Resp 18

## 2020-07-18 DIAGNOSIS — Z5111 Encounter for antineoplastic chemotherapy: Secondary | ICD-10-CM | POA: Diagnosis not present

## 2020-07-18 DIAGNOSIS — T451X5A Adverse effect of antineoplastic and immunosuppressive drugs, initial encounter: Secondary | ICD-10-CM

## 2020-07-18 DIAGNOSIS — G620 Drug-induced polyneuropathy: Secondary | ICD-10-CM | POA: Diagnosis not present

## 2020-07-18 DIAGNOSIS — C541 Malignant neoplasm of endometrium: Secondary | ICD-10-CM

## 2020-07-18 DIAGNOSIS — Z95828 Presence of other vascular implants and grafts: Secondary | ICD-10-CM

## 2020-07-18 LAB — COMPREHENSIVE METABOLIC PANEL
ALT: 22 U/L (ref 0–44)
AST: 17 U/L (ref 15–41)
Albumin: 4 g/dL (ref 3.5–5.0)
Alkaline Phosphatase: 96 U/L (ref 38–126)
Anion gap: 6 (ref 5–15)
BUN: 18 mg/dL (ref 6–20)
CO2: 26 mmol/L (ref 22–32)
Calcium: 9.2 mg/dL (ref 8.9–10.3)
Chloride: 108 mmol/L (ref 98–111)
Creatinine, Ser: 0.6 mg/dL (ref 0.44–1.00)
GFR, Estimated: >60 mL/min (ref >60)
Glucose, Bld: 110 mg/dL — ABNORMAL HIGH (ref 70–99)
Potassium: 3.9 mmol/L (ref 3.5–5.1)
Sodium: 140 mmol/L (ref 135–145)
Total Bilirubin: 0.3 mg/dL (ref 0.3–1.2)
Total Protein: 7 g/dL (ref 6.5–8.1)

## 2020-07-18 LAB — CBC WITH DIFFERENTIAL/PLATELET
Abs Immature Granulocytes: 0.03 10*3/uL (ref 0.00–0.07)
Basophils Absolute: 0.1 10*3/uL (ref 0.0–0.1)
Basophils Relative: 1 %
Eosinophils Absolute: 0.1 10*3/uL (ref 0.0–0.5)
Eosinophils Relative: 2 %
HCT: 37.1 % (ref 36.0–46.0)
Hemoglobin: 12.2 g/dL (ref 12.0–15.0)
Immature Granulocytes: 1 %
Lymphocytes Relative: 34 %
Lymphs Abs: 2.2 10*3/uL (ref 0.7–4.0)
MCH: 28.9 pg (ref 26.0–34.0)
MCHC: 32.9 g/dL (ref 30.0–36.0)
MCV: 87.9 fL (ref 80.0–100.0)
Monocytes Absolute: 0.5 10*3/uL (ref 0.1–1.0)
Monocytes Relative: 8 %
Neutro Abs: 3.6 10*3/uL (ref 1.7–7.7)
Neutrophils Relative %: 54 %
Platelets: 254 10*3/uL (ref 150–400)
RBC: 4.22 MIL/uL (ref 3.87–5.11)
RDW: 15.9 % — ABNORMAL HIGH (ref 11.5–15.5)
WBC: 6.5 10*3/uL (ref 4.0–10.5)
nRBC: 0 % (ref 0.0–0.2)

## 2020-07-18 LAB — MAGNESIUM: Magnesium: 1.6 mg/dL — ABNORMAL LOW (ref 1.7–2.4)

## 2020-07-18 MED ORDER — FAMOTIDINE 20 MG IN NS 100 ML IVPB
20.0000 mg | Freq: Once | INTRAVENOUS | Status: AC
  Administered 2020-07-18: 20 mg via INTRAVENOUS
  Filled 2020-07-18: qty 20

## 2020-07-18 MED ORDER — SODIUM CHLORIDE 0.9 % IV SOLN: Freq: Once | INTRAVENOUS | Status: AC

## 2020-07-18 MED ORDER — SODIUM CHLORIDE 0.9% FLUSH
10.0000 mL | INTRAVENOUS | Status: DC | PRN
Start: 2020-07-18 — End: 2020-07-18
  Administered 2020-07-18: 10 mL

## 2020-07-18 MED ORDER — HEPARIN SOD (PORK) LOCK FLUSH 100 UNIT/ML IV SOLN
500.0000 [IU] | Freq: Once | INTRAVENOUS | Status: AC | PRN
  Administered 2020-07-18: 500 [IU]

## 2020-07-18 MED ORDER — GABAPENTIN 300 MG PO CAPS: 300.0000 mg | ORAL_CAPSULE | Freq: Three times a day (TID) | ORAL | 6 refills | Status: DC

## 2020-07-18 MED ORDER — SODIUM CHLORIDE 0.9 % IV SOLN
10.0000 mg | Freq: Once | INTRAVENOUS | Status: AC
  Administered 2020-07-18: 10 mg via INTRAVENOUS
  Filled 2020-07-18: qty 10

## 2020-07-18 MED ORDER — SODIUM CHLORIDE 0.9 % IV SOLN
140.0000 mg/m2 | Freq: Once | INTRAVENOUS | Status: AC
  Administered 2020-07-18: 306 mg via INTRAVENOUS
  Filled 2020-07-18: qty 51

## 2020-07-18 MED ORDER — PALONOSETRON HCL INJECTION 0.25 MG/5ML
0.2500 mg | Freq: Once | INTRAVENOUS | Status: AC
  Administered 2020-07-18: 0.25 mg via INTRAVENOUS
  Filled 2020-07-18: qty 5

## 2020-07-18 MED ORDER — DIPHENHYDRAMINE HCL 50 MG/ML IJ SOLN
25.0000 mg | Freq: Once | INTRAMUSCULAR | Status: AC
  Administered 2020-07-18: 25 mg via INTRAVENOUS
  Filled 2020-07-18: qty 1

## 2020-07-18 NOTE — Progress Notes (Signed)
Patient tolerated chemotherapy with no complaints voiced. Side effects with management reviewed understanding verbalized. Port site clean and dry with no bruising or swelling noted at site. Good blood return noted before and after administration of chemotherapy. Band aid applied. Patient left in satisfactory condition with VSS and no s/s of distress noted. 

## 2020-07-18 NOTE — Patient Instructions (Signed)
[facility]  Discharge Instructions: Thank you for choosing [facility] to provide your oncology and hematology care.  If you have a lab appointment with the [facility], please come in thru the Main Entrance and check in at the main information desk.  Wear comfortable clothing and clothing appropriate for easy access to any Portacath or PICC line.   We strive to give you quality time with your provider. You may need to reschedule your appointment if you arrive late (15 or more minutes).  Arriving late affects you and other patients whose appointments are after yours.  Also, if you miss three or more appointments without notifying the office, you may be dismissed from the clinic at the provider's discretion.      For prescription refill requests, have your pharmacy contact our office and allow 72 hours for refills to be completed.    Today you received the following chemotherapy and/or immunotherapy agents: Paclitaxel   To help prevent nausea and vomiting after your treatment, we encourage you to take your nausea medication as directed.  BELOW ARE SYMPTOMS THAT SHOULD BE REPORTED IMMEDIATELY: *FEVER GREATER THAN 100.4 F (38 C) OR HIGHER *CHILLS OR SWEATING *NAUSEA AND VOMITING THAT IS NOT CONTROLLED WITH YOUR NAUSEA MEDICATION *UNUSUAL SHORTNESS OF BREATH *UNUSUAL BRUISING OR BLEEDING *URINARY PROBLEMS (pain or burning when urinating, or frequent urination) *BOWEL PROBLEMS (unusual diarrhea, constipation, pain near the anus) TENDERNESS IN MOUTH AND THROAT WITH OR WITHOUT PRESENCE OF ULCERS (sore throat, sores in mouth, or a toothache) UNUSUAL RASH, SWELLING OR PAIN  UNUSUAL VAGINAL DISCHARGE OR ITCHING   Items with * indicate a potential emergency and should be followed up as soon as possible or go to the Emergency Department if any problems should occur.  Please show the CHEMOTHERAPY ALERT CARD or IMMUNOTHERAPY ALERT CARD at check-in to the Emergency  Department and triage nurse.  Should you have questions after your visit or need to cancel or reschedule your appointment, please contact [facility] [phone]  and follow the prompts.  Office hours are 8:00 a.m. to 4:30 p.m. Monday - Friday. Please note that voicemails left after 4:00 p.m. may not be returned until the following business day.  We are closed weekends and major holidays. You have access to a nurse at all times for urgent questions. Please call the main number to the clinic [phone] and follow the prompts.  For any non-urgent questions, you may also contact your provider using MyChart. We now offer e-Visits for anyone 23 and older to request care online for non-urgent symptoms. For details visit [email or web address].   Also download the MyChart app! Go to the app store, search "MyChart", open the app, select [facility], and log in with your MyChart username and password.  Due to Covid, a mask is required upon entering the hospital/clinic. If you do not have a mask, one will be given to you upon arrival. For doctor visits, patients may have 1 support person aged 32 or older with them. For treatment visits, patients cannot have anyone with them due to current Covid guidelines and our immunocompromised population.

## 2020-07-18 NOTE — Assessment & Plan Note (Signed)
Neuropathy is stable We discussed trial of cryotherapy in the future She will continue gabapentin as prescribed

## 2020-07-18 NOTE — Progress Notes (Signed)
[place] OFFICE PROGRESS NOTE  Patient Care Team: Perlie Mayo, NP as PCP - General (Family Medicine) Donetta Potts, RN as Oncology Nurse Navigator [name], MD as Medical Oncologist (Oncology)  ASSESSMENT & PLAN:  Endometrial cancer [facility]) Her recent Ct showed stable disease control Overall, she tolerated treatment well except for neuropathy We will continue treatment indefinitely   Peripheral neuropathy due to chemotherapy ([place]) Neuropathy is stable We discussed trial of cryotherapy in the future She will continue gabapentin as prescribed  No orders of the defined types were placed in this encounter.   All questions were answered. The patient knows to call the clinic with any problems, questions or concerns. The total time spent in the appointment was 20 minutes encounter with patients including review of chart and various tests results, discussions about plan of care and coordination of care plan   Heath Lark, MD 07/18/2020 11:47 AM  INTERVAL HISTORY: Please see below for problem oriented charting. She returns for chemo Neuropathy stable She denies changes in bowel habits or other new side effects  SUMMARY OF ONCOLOGIC HISTORY: Oncology History  Endometrial cancer ([place])  03/31/2018 Initial Diagnosis   Endometrial cancer ([place])    12/29/2018 -  Chemotherapy    Patient is on Treatment Plan: UTERINE CARBOPLATIN AUC 6 / PACLITAXEL Q21D       06/06/2020 Miscellaneous   Summary of oncologic history from Dr. Delton Coombes   1.  Recurrent endometrial carcinosarcoma: -TAH, BSO, bilateral pelvic and para-aortic lymph node resections on 04/13/2018. -Pathology showed carcinosarcoma (malignant mixed mullerian tumor) is arising in an endometrial type polyp with no myometrial invasion identified.  High-grade.  0/39 lymph nodes positive.  PT1APN0, FIGO stage Ia.  MMR normal.  MSI-stable. -CTAP on 12/09/2018 for abdominal pain showed extensive  peritoneal carcinomatosis and large soft tissue mass in the pelvis. -Biopsy of the omental mass on 12/28/2018 shows poorly differentiated carcinoma consistent with her prior malignancy. -Carboplatin and paclitaxel started on 12/29/2018. -CT scan on 05/02/2019 showed peritoneal implants in the low central small bowel mesentery and left pelvic sidewall have decreased in size measuring 1.5 x 1.9 cm and 1.7 x 2.2 cm.  Soft tissue nodule in the left lateral omentum measures 1 x 1.7 cm with no evidence of metastatic disease. -Continuation of chemotherapy until complete response was recommended. -CT AP on 07/04/2019 showed continued positive response to therapy with scattered peritoneal metastasis in the pelvis and left omentum decreased in size.  No new metastatic disease. -CTAP on 10/24/2019 after 14 cycles showed substantial reduction in size of soft tissue nodules in the pelvis.  1.4 x 0.9 x 1 cm soft tissue density in the inferior serosal margin of the sigmoid colon, previously 1.7 x 1.1 x 1.7 cm.  2 small soft tissue nodules in the mesenteric adipose tissue above the urinary bladder measures 0.8 cm. -She has developed serious allergic reaction during cycle 14 with carboplatin. -I have discussed with Dr. Denman George.  Other options include adding ifosfamide to Taxol or Doxil and combination of lenvatinib with pembrolizumab.  In the lenvatinib pembrolizumab trial carcinosarcomas were not included. -Single agent paclitaxel started on 11/09/2019, dose reduced by 20% on 01/18/2020. -CTAP on 01/10/2020 with stable small peritoneal soft tissue nodules in the pelvis.  Resolution of soft tissue nodularity in the left abdominal omental fat with no new or progressive disease.     06/20/2020 Imaging   1. Stable examination status post hysterectomy without suspicious enhancing soft tissue nodularity at the vaginal cuff  and no significant change in the multiple small soft tissue nodules in the pelvis likely representing treated  tumor. No new or progressive findings. 2. Cholelithiasis including a large gallstone in the neck of the gallbladder but without CT evidence of acute cholecystitis. 3. Small hiatal hernia. 4. Aortic atherosclerosis.       REVIEW OF SYSTEMS:   Constitutional: Denies fevers, chills or abnormal weight loss Eyes: Denies blurriness of vision Ears, nose, mouth, throat, and face: Denies mucositis or sore throat Respiratory: Denies cough, dyspnea or wheezes Cardiovascular: Denies palpitation, chest discomfort or lower extremity swelling Gastrointestinal:  Denies nausea, heartburn or change in bowel habits Skin: Denies abnormal skin rashes Lymphatics: Denies new lymphadenopathy or easy bruising Behavioral/Psych: Mood is stable, no new changes  All other systems were reviewed with the patient and are negative.  I have reviewed the past medical history, past surgical history, social history and family history with the patient and they are unchanged from previous note.  ALLERGIES:  is allergic to carboplatin and nickel.  MEDICATIONS:  Current Outpatient Medications  Medication Sig Dispense Refill   amLODipine (NORVASC) 5 MG tablet Take 1 tablet (5 mg total) by mouth daily. 90 tablet 1   atorvastatin (LIPITOR) 20 MG tablet Take 1 tablet by mouth once daily 90 tablet 0   CONSTULOSE 10 GM/15ML solution Take 30 g by mouth daily as needed.      docusate sodium (COLACE) 100 MG capsule Take 200 mg by mouth 2 (two) times daily as needed for mild constipation (when taking pain medication).     EPINEPHrine 0.3 mg/0.3 mL IJ SOAJ injection Inject 0.3 mg into the muscle as needed for anaphylaxis. 1 each 0   ibuprofen (ADVIL) 200 MG tablet Take 200 mg by mouth every 6 (six) hours as needed.     oxyCODONE-acetaminophen (PERCOCET) 5-325 MG tablet Take 1 tablet by mouth every 12 (twelve) hours as needed (pain). 60 tablet 0   PACLITAXEL IV Inject into the vein every 21 ( twenty-one) days.     polyethylene glycol  (MIRALAX / GLYCOLAX) 17 g packet Take 17 g by mouth 2 (two) times daily as needed.     gabapentin (NEURONTIN) 300 MG capsule Take 1 capsule (300 mg total) by mouth 3 (three) times daily. 90 capsule 6   lidocaine-prilocaine (EMLA) cream APPLY SMALL AMOUNT TO PORT A CATH SITE AND COVER WITH PLASTIC WRAP ONE HOUR PRIOR TO CHEMOTHERAPY APPOINTMENTS (Patient not taking: Reported on 07/18/2020) 30 g 0   prochlorperazine (COMPAZINE) 10 MG tablet Take 1 tablet (10 mg total) by mouth every 6 (six) hours as needed (Nausea or vomiting). (Patient not taking: Reported on 07/18/2020) 60 tablet 3   No current facility-administered medications for this visit.   Facility-Administered Medications Ordered in Other Visits  Medication Dose Route Frequency Provider Last Rate Last Admin   heparin lock flush 100 unit/mL  500 Units Intracatheter Once PRN Alvy Bimler, Sparsh Callens, MD       PACLitaxel (TAXOL) 306 mg in sodium chloride 0.9 % 500 mL chemo infusion (> 9m/m2)  140 mg/m2 (Treatment Plan Recorded) Intravenous Once GAlvy Bimler Harlene Petralia, MD 184 mL/hr at 07/18/20 1110 306 mg at 07/18/20 1110   sodium chloride flush (NS) 0.9 % injection 10 mL  10 mL Intracatheter PRN GHeath Lark MD   10 mL at 07/18/20 0805    PHYSICAL EXAMINATION: ECOG PERFORMANCE STATUS: 1 - Symptomatic but completely ambulatory  There were no vitals filed for this visit. There were no vitals filed for this  visit.  GENERAL:alert, no distress and comfortable SKIN: skin color, texture, turgor are normal, no rashes or significant lesions EYES: normal, Conjunctiva are pink and non-injected, sclera clear OROPHARYNX:no exudate, no erythema and lips, buccal mucosa, and tongue normal  NECK: supple, thyroid normal size, non-tender, without nodularity LYMPH:  no palpable lymphadenopathy in the cervical, axillary or inguinal LUNGS: clear to auscultation and percussion with normal breathing effort HEART: regular rate & rhythm and no murmurs and no lower extremity  edema ABDOMEN:abdomen soft, non-tender and normal bowel sounds Musculoskeletal:no cyanosis of digits and no clubbing  NEURO: alert & oriented x 3 with fluent speech, no focal motor/sensory deficits  LABORATORY DATA:  I have reviewed the data as listed    Component Value Date/Time   NA 140 07/18/2020 0814   NA 141 04/26/2018 1027   K 3.9 07/18/2020 0814   CL 108 07/18/2020 0814   CO2 26 07/18/2020 0814   GLUCOSE 110 (H) 07/18/2020 0814   BUN 18 07/18/2020 0814   BUN 13 04/26/2018 1027   CREATININE 0.60 07/18/2020 0814   CALCIUM 9.2 07/18/2020 0814   PROT 7.0 07/18/2020 0814   PROT 6.4 04/26/2018 1027   ALBUMIN 4.0 07/18/2020 0814   ALBUMIN 4.2 04/26/2018 1027   AST 17 07/18/2020 0814   ALT 22 07/18/2020 0814   ALKPHOS 96 07/18/2020 0814   BILITOT 0.3 07/18/2020 0814   BILITOT 0.3 04/26/2018 1027   GFRNONAA >60 07/18/2020 0814   GFRAA >60 10/05/2019 0825    No results found for: SPEP, UPEP  Lab Results  Component Value Date   WBC 6.5 07/18/2020   NEUTROABS 3.6 07/18/2020   HGB 12.2 07/18/2020   HCT 37.1 07/18/2020   MCV 87.9 07/18/2020   PLT 254 07/18/2020      Chemistry      Component Value Date/Time   NA 140 07/18/2020 0814   NA 141 04/26/2018 1027   K 3.9 07/18/2020 0814   CL 108 07/18/2020 0814   CO2 26 07/18/2020 0814   BUN 18 07/18/2020 0814   BUN 13 04/26/2018 1027   CREATININE 0.60 07/18/2020 0814      Component Value Date/Time   CALCIUM 9.2 07/18/2020 0814   ALKPHOS 96 07/18/2020 0814   AST 17 07/18/2020 0814   ALT 22 07/18/2020 0814   BILITOT 0.3 07/18/2020 0814   BILITOT 0.3 04/26/2018 1027

## 2020-07-18 NOTE — Assessment & Plan Note (Signed)
Her recent Ct showed stable disease control Overall, she tolerated treatment well except for neuropathy We will continue treatment indefinitely

## 2020-08-08 ENCOUNTER — Ambulatory Visit (HOSPITAL_COMMUNITY): Payer: Medicaid Other | Admitting: Hematology

## 2020-08-08 ENCOUNTER — Ambulatory Visit (HOSPITAL_COMMUNITY): Payer: Medicaid Other

## 2020-08-08 ENCOUNTER — Other Ambulatory Visit (HOSPITAL_COMMUNITY): Payer: Medicaid Other

## 2020-08-08 NOTE — Progress Notes (Addendum)
Jessica [name], [place] [address]   CLINIC:  Medical Oncology/Hematology  PCP:  Perlie Mayo, NP [address] / [place] [address] [phone]   REASON FOR VISIT:  Follow-up for recurrent endometrial cancer  PRIOR THERAPY: TAH & BSO, bilateral pelvic and para-aortic lymph nodes dissections on 04/13/2018  NGS Results: not done  CURRENT THERAPY: Paclitaxel & Aloxi every 3 weeks  BRIEF ONCOLOGIC HISTORY:  Oncology History  Endometrial cancer ([facility])  03/31/2018 Initial Diagnosis   Endometrial cancer ([facility])    12/29/2018 -  Chemotherapy    Patient is on Treatment Plan: UTERINE CARBOPLATIN AUC 6 / PACLITAXEL Q21D       06/06/2020 Miscellaneous   Summary of oncologic history from Dr. Delton Coombes   1.  Recurrent endometrial carcinosarcoma: -TAH, BSO, bilateral pelvic and para-aortic lymph node resections on 04/13/2018. -Pathology showed carcinosarcoma (malignant mixed mullerian tumor) is arising in an endometrial type polyp with no myometrial invasion identified.  High-grade.  0/39 lymph nodes positive.  PT1APN0, FIGO stage Ia.  MMR normal.  MSI-stable. -CTAP on 12/09/2018 for abdominal pain showed extensive peritoneal carcinomatosis and large soft tissue mass in the pelvis. -Biopsy of the omental mass on 12/28/2018 shows poorly differentiated carcinoma consistent with her prior malignancy. -Carboplatin and paclitaxel started on 12/29/2018. -CT scan on 05/02/2019 showed peritoneal implants in the low central small bowel mesentery and left pelvic sidewall have decreased in size measuring 1.5 x 1.9 cm and 1.7 x 2.2 cm.  Soft tissue nodule in the left lateral omentum measures 1 x 1.7 cm with no evidence of metastatic disease. -Continuation of chemotherapy until complete response was recommended. -CT AP on 07/04/2019 showed continued positive response to therapy with scattered peritoneal metastasis in the pelvis and left omentum decreased in size.  No new  metastatic disease. -CTAP on 10/24/2019 after 14 cycles showed substantial reduction in size of soft tissue nodules in the pelvis.  1.4 x 0.9 x 1 cm soft tissue density in the inferior serosal margin of the sigmoid colon, previously 1.7 x 1.1 x 1.7 cm.  2 small soft tissue nodules in the mesenteric adipose tissue above the urinary bladder measures 0.8 cm. -She has developed serious allergic reaction during cycle 14 with carboplatin. -I have discussed with Dr. Denman George.  Other options include adding ifosfamide to Taxol or Doxil and combination of lenvatinib with pembrolizumab.  In the lenvatinib pembrolizumab trial carcinosarcomas were not included. -Single agent paclitaxel started on 11/09/2019, dose reduced by 20% on 01/18/2020. -CTAP on 01/10/2020 with stable small peritoneal soft tissue nodules in the pelvis.  Resolution of soft tissue nodularity in the left abdominal omental fat with no new or progressive disease.     06/20/2020 Imaging   1. Stable examination status post hysterectomy without suspicious enhancing soft tissue nodularity at the vaginal cuff and no significant change in the multiple small soft tissue nodules in the pelvis likely representing treated tumor. No new or progressive findings. 2. Cholelithiasis including a large gallstone in the neck of the gallbladder but without CT evidence of acute cholecystitis. 3. Small hiatal hernia. 4. Aortic atherosclerosis.       CANCER STAGING: Cancer Staging No matching staging information was found for the patient.  Virtual Visit via Video Note  I connected with Jessica [name] on _0 @ at  9:00 AM EDT by a video enabled telemedicine application and verified that I am speaking with the correct person using two identifiers.  Location: Patient: clinic Provider:  home  Others present: Renda Rolls, RN  I discussed the limitations of evaluation and management by telemedicine and the availability of in person appointments. The patient  expressed understanding and agreed to proceed.  INTERVAL HISTORY:  Jessica [name], a 60 y.o. female, returns for routine follow-up and consideration for next cycle of chemotherapy. [name] was last seen on 05/16/20.  Due for cycle #27 of Paclitaxel & Aloxi today.   Overall, she tells me she has been feeling pretty well. She reports that her neuropathy is stable. There is tingling and numbness in both feet although it is more pronounced in the right foot. She denies any pain associated with the neuropathy, and gabapentin has helped. She reports fatigue, diarrhea, and nausea for a couple days following treatment, but she denies vomiting.   Overall, she feels ready for next cycle of chemo today.   REVIEW OF SYSTEMS:  Review of Systems  Constitutional:  Negative for appetite change.  Gastrointestinal:  Positive for diarrhea and nausea. Negative for vomiting.  Genitourinary:  Positive for frequency.   Neurological:  Positive for dizziness, headaches and numbness (tingling and numb in feet).   PAST MEDICAL/SURGICAL HISTORY:  Past Medical History:  Diagnosis Date   Cancer ([facility])    Phreesia 09/05/2019   Elevated blood pressure reading    Endometrial cancer ([facility])    Hyperlipidemia    Hypertension    PMB (postmenopausal bleeding)    Port-A-Cath in place 12/24/2018   Past Surgical History:  Procedure Laterality Date   ABDOMINAL HYSTERECTOMY N/A    Phreesia 09/05/2019   IR IMAGING GUIDED PORT INSERTION  12/28/2018   IR RADIOLOGIST EVAL & MGMT  06/02/2018   IR RADIOLOGIST EVAL & MGMT  06/16/2018   NECK SURGERY  2000   spinal surgery , titanium rod in place    ROBOTIC ASSISTED TOTAL HYSTERECTOMY WITH BILATERAL SALPINGO OOPHERECTOMY N/A 04/13/2018   Procedure: XI ROBOTIC [place];  Surgeon: Everitt Amber, MD;  Location: [facility];  Service: Gynecology;  Laterality: N/A;   ROBOTIC PELVIC AND PARA-AORTIC LYMPH NODE DISSECTION N/A 04/13/2018    Procedure: XI ROBOTIC PELVIC LYMPHADECTOMY AND PARA-AORTIC LYMPH NODE DISSECTION;  Surgeon: Everitt Amber, MD;  Location: [facility];  Service: Gynecology;  Laterality: N/A;    SOCIAL HISTORY:  Social History   Socioeconomic History   Marital status: Widowed    Spouse name: Not on file   Number of children: 3   Years of education: Not on file   Highest education level: Not on file  Occupational History   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  Tobacco Use   Smoking status: Never   Smokeless tobacco: Never  Vaping Use   Vaping Use: Never used  Substance and Sexual Activity   Alcohol use: Yes    Alcohol/week: 4.0 standard drinks    Types: 4 Glasses of wine per week    Comment: weekends   Drug use: Never   Sexual activity: Not Currently  Other Topics Concern   Not on file  Social History Narrative   Lives with daughter and granddaughter here in [place], moved 3 years ago      Cats: Zoe-53 years old, Eduard Clos, and Horald Pollen      Enjoys: walking when she can tolerate- spending time with granddaughter (her life)      Diet: eats all food groups   Caffeine: 1-2 cups at times, but does not tolerate it all the time  Water: 160 oz daily      Wears seat belt    Does not use phone while driving    Oceanographer at home   Fire extinguisher-no   No weapons   Social Determinants of Health   Financial Resource Strain: Low Risk    Difficulty of Paying Living Expenses: Not hard at all  Food Insecurity: No Food Insecurity   Worried About Charity fundraiser in the Last Year: Never true   Arboriculturist in the Last Year: Never true  Transportation Needs: No Transportation Needs   Lack of Transportation (Medical): No   Lack of Transportation (Non-Medical): No  Physical Activity: Inactive   Days of Exercise per Week: 0 days   Minutes of Exercise per Session: 0 min  Stress: Stress Concern Present   Feeling of Stress : Rather much  Social Connections: Socially Isolated    Frequency of Communication with Friends and Family: More than three times a week   Frequency of Social Gatherings with Friends and Family: More than three times a week   Attends Religious Services: Never   Marine scientist or Organizations: No   Attends Archivist Meetings: Never   Marital Status: Widowed  Human resources officer Violence: Not At Risk   Fear of Current or Ex-Partner: No   Emotionally Abused: No   Physically Abused: No   Sexually Abused: No    FAMILY HISTORY:  Family History  Problem Relation Age of Onset   Hypertension Father    Heart disease Father    Cancer Father        lung   Hypertension Mother    Cancer Sister        ovarian cancer    CURRENT MEDICATIONS:  Current Outpatient Medications  Medication Sig Dispense Refill   amLODipine (NORVASC) 5 MG tablet Take 1 tablet (5 mg total) by mouth daily. 90 tablet 1   atorvastatin (LIPITOR) 20 MG tablet Take 1 tablet by mouth once daily 90 tablet 0   CONSTULOSE 10 GM/15ML solution Take 30 g by mouth daily as needed.      docusate sodium (COLACE) 100 MG capsule Take 200 mg by mouth 2 (two) times daily as needed for mild constipation (when taking pain medication).     EPINEPHrine 0.3 mg/0.3 mL IJ SOAJ injection Inject 0.3 mg into the muscle as needed for anaphylaxis. 1 each 0   gabapentin (NEURONTIN) 300 MG capsule Take 1 capsule (300 mg total) by mouth 3 (three) times daily. 90 capsule 6   ibuprofen (ADVIL) 200 MG tablet Take 200 mg by mouth every 6 (six) hours as needed.     lidocaine-prilocaine (EMLA) cream APPLY SMALL AMOUNT TO PORT A CATH SITE AND COVER WITH PLASTIC WRAP ONE HOUR PRIOR TO CHEMOTHERAPY APPOINTMENTS (Patient not taking: Reported on 07/18/2020) 30 g 0   oxyCODONE-acetaminophen (PERCOCET) 5-325 MG tablet Take 1 tablet by mouth every 12 (twelve) hours as needed (pain). 60 tablet 0   PACLITAXEL IV Inject into the vein every 21 ( twenty-one) days.     polyethylene glycol (MIRALAX / GLYCOLAX) 17  g packet Take 17 g by mouth 2 (two) times daily as needed.     prochlorperazine (COMPAZINE) 10 MG tablet Take 1 tablet (10 mg total) by mouth every 6 (six) hours as needed (Nausea or vomiting). (Patient not taking: Reported on 07/18/2020) 60 tablet 3   No current facility-administered medications for this visit.    ALLERGIES:  Allergies  Allergen Reactions   Carboplatin Other (See Comments)    "body on fire and abdominal pain"   Nickel Rash    itchy    Performance status (ECOG): 1 - Symptomatic but completely ambulatory  There were no vitals filed for this visit. Wt Readings from Last 3 Encounters:  07/18/20 233 lb 7 oz (105.9 kg)  06/27/20 238 lb 12.8 oz (108.3 kg)  06/06/20 232 lb 4.8 oz (105.4 kg)   LABORATORY DATA:  I have reviewed the labs as listed.  CBC Latest Ref Rng & Units 07/18/2020 06/27/2020 06/06/2020  WBC 4.0 - 10.5 K/uL 6.5 6.9 6.9  Hemoglobin 12.0 - 15.0 g/dL 12.2 11.5(L) 11.9(L)  Hematocrit 36.0 - 46.0 % 37.1 35.6(L) 36.2  Platelets 150 - 400 K/uL 254 271 306   CMP Latest Ref Rng & Units 07/18/2020 06/27/2020 06/06/2020  Glucose 70 - 99 mg/dL 110(H) 104(H) 118(H)  BUN 6 - 20 mg/dL _0 Creatinine 0.44 - 1.00 mg/dL 0.60 0.56 0.62  Sodium 135 - 145 mmol/L 140 140 138  Potassium 3.5 - 5.1 mmol/L 3.9 3.4(L) 3.5  Chloride 98 - 111 mmol/L 108 107 104  CO2 22 - 32 mmol/L _1 Calcium 8.9 - 10.3 mg/dL 9.2 8.9 9.2  Total Protein 6.5 - 8.1 g/dL 7.0 6.6 7.3  Total Bilirubin 0.3 - 1.2 mg/dL 0.3 0.4 0.6  Alkaline Phos 38 - 126 U/L 96 89 100  AST 15 - 41 U/L 17 18 35  ALT 0 - 44 U/L 22 22 37    DIAGNOSTIC IMAGING:  I have independently reviewed the scans and discussed with the patient. No results found.   ASSESSMENT:  1.  Recurrent endometrial carcinosarcoma: -TAH, BSO, bilateral pelvic and para-aortic lymph node resections on 04/13/2018. -Pathology showed carcinosarcoma (malignant mixed mullerian tumor) is arising in an endometrial type polyp with no  myometrial invasion identified.  High-grade.  0/39 lymph nodes positive.  PT1APN0, FIGO stage Ia.  MMR normal.  MSI-stable. -CTAP on 12/09/2018 for abdominal pain showed extensive peritoneal carcinomatosis and large soft tissue mass in the pelvis. -Biopsy of the omental mass on 12/28/2018 shows poorly differentiated carcinoma consistent with her prior malignancy. -Carboplatin and paclitaxel started on 12/29/2018. -CT scan on 05/02/2019 showed peritoneal implants in the low central small bowel mesentery and left pelvic sidewall have decreased in size measuring 1.5 x 1.9 cm and 1.7 x 2.2 cm.  Soft tissue nodule in the left lateral omentum measures 1 x 1.7 cm with no evidence of metastatic disease. -Continuation of chemotherapy until complete response was recommended. -CT AP on 07/04/2019 showed continued positive response to therapy with scattered peritoneal metastasis in the pelvis and left omentum decreased in size.  No new metastatic disease. -CTAP on 10/24/2019 after 14 cycles showed substantial reduction in size of soft tissue nodules in the pelvis.  1.4 x 0.9 x 1 cm soft tissue density in the inferior serosal margin of the sigmoid colon, previously 1.7 x 1.1 x 1.7 cm.  2 small soft tissue nodules in the mesenteric adipose tissue above the urinary bladder measures 0.8 cm. -She has developed serious allergic reaction during cycle 14 with carboplatin. -I have discussed with Dr. Denman George.  Other options include adding ifosfamide to Taxol or Doxil and combination of lenvatinib with pembrolizumab.  In the lenvatinib pembrolizumab trial carcinosarcomas were not included. -Single agent paclitaxel started on 11/09/2019, dose reduced by 20% on 01/18/2020. -CTAP on 01/10/2020 with stable small peritoneal soft tissue nodules in the pelvis.  Resolution of soft tissue nodularity in the left abdominal omental fat with no new or progressive disease.   2.  Breast abnormality: -Mammogram on 05/02/2019 shows BI-RADS Category  0.  She reportedly took Covid shot 1 to 2 weeks prior to the mammogram.  Further scans rescheduled on 06/21/2019.   3.  Peripheral neuropathy: -She reported numbness and occasional pins-and-needles sensation in the bilateral toes, right more than left.  This has started about a week ago.   PLAN:  1.  Recurrent endometrial carcinosarcoma: - We reviewed her CT AP with contrast from 06/20/2020 which showed stable examination without suspicious enhancing soft tissue nodularity at the vaginal cuff and no significant change in the multiple small soft tissue nodules in the pelvis likely representing treated tumor.  No new or progressive findings. - She is tolerating paclitaxel reasonably well.  She has occasional nausea but denied any vomiting.  She feels tired for few days after each treatment which improves. - Last CA125 was within normal limits.  Reviewed labs today which showed normal CBC and LFTs.  Creatinine was also normal. - She will proceed with her treatment today and in 3 weeks.  RTC 6 weeks for follow-up.  I plan to repeat CT AP with contrast and a CA125 level prior to next visit in 6 weeks. - Dr. Alvy Bimler has seen her 3 weeks ago has indicated possibility of cryotherapy.  After the CT scan, I will consider contacting her.   2.  Low back pain/right-sided abdominal pain: - She has bilateral hip pains on and off.  She has taken 2 pain tablets in the last 2 weeks.   3.  Hypertension: - Continue Norvasc 5 mg daily.  Blood pressure today is 137/88.   4.  Neuropathy: - She has numbness in her toes, right more than left which is constant.  Continue gabapentin 300 mg at bedtime.   5.  Abnormal mammogram: - She had abnormal mammogram on 05/15/2020 followed by biopsy of the right breast mass at 9 o'clock position which showed pseudo angiomatous stromal hyperplasia with no evidence of malignancy. - She will continue regular mammograms.   Orders placed this encounter:  No orders of the defined types  were placed in this encounter.  I provided 30 minutes of non-face-to-face time during this encounter.  [name], MD [place] [phone]   I, Thana Ates, am acting as a scribe for Dr. [name].  I, [name] MD, have reviewed the above documentation for accuracy and completeness, and I agree with the above.

## 2020-08-09 ENCOUNTER — Inpatient Hospital Stay (HOSPITAL_COMMUNITY): Payer: Medicaid Other | Attending: Hematology

## 2020-08-09 ENCOUNTER — Inpatient Hospital Stay (HOSPITAL_COMMUNITY): Payer: Medicaid Other

## 2020-08-09 ENCOUNTER — Other Ambulatory Visit (HOSPITAL_COMMUNITY): Payer: Self-pay | Admitting: *Deleted

## 2020-08-09 ENCOUNTER — Inpatient Hospital Stay (HOSPITAL_BASED_OUTPATIENT_CLINIC_OR_DEPARTMENT_OTHER): Payer: Medicaid Other | Admitting: Hematology

## 2020-08-09 ENCOUNTER — Other Ambulatory Visit: Payer: Self-pay

## 2020-08-09 VITALS — BP 133/79 | HR 75 | Temp 98.3°F | Resp 18

## 2020-08-09 VITALS — BP 137/78 | HR 87 | Temp 96.8°F | Resp 18 | Wt 234.2 lb

## 2020-08-09 DIAGNOSIS — Z5111 Encounter for antineoplastic chemotherapy: Secondary | ICD-10-CM | POA: Diagnosis present

## 2020-08-09 DIAGNOSIS — I10 Essential (primary) hypertension: Secondary | ICD-10-CM | POA: Diagnosis not present

## 2020-08-09 DIAGNOSIS — Z79899 Other long term (current) drug therapy: Secondary | ICD-10-CM | POA: Insufficient documentation

## 2020-08-09 DIAGNOSIS — Z95828 Presence of other vascular implants and grafts: Secondary | ICD-10-CM

## 2020-08-09 DIAGNOSIS — C786 Secondary malignant neoplasm of retroperitoneum and peritoneum: Secondary | ICD-10-CM | POA: Diagnosis present

## 2020-08-09 DIAGNOSIS — Z90710 Acquired absence of both cervix and uterus: Secondary | ICD-10-CM | POA: Insufficient documentation

## 2020-08-09 DIAGNOSIS — Z9079 Acquired absence of other genital organ(s): Secondary | ICD-10-CM | POA: Insufficient documentation

## 2020-08-09 DIAGNOSIS — R928 Other abnormal and inconclusive findings on diagnostic imaging of breast: Secondary | ICD-10-CM | POA: Insufficient documentation

## 2020-08-09 DIAGNOSIS — Z90722 Acquired absence of ovaries, bilateral: Secondary | ICD-10-CM | POA: Diagnosis not present

## 2020-08-09 DIAGNOSIS — C541 Malignant neoplasm of endometrium: Secondary | ICD-10-CM | POA: Diagnosis present

## 2020-08-09 DIAGNOSIS — G629 Polyneuropathy, unspecified: Secondary | ICD-10-CM | POA: Diagnosis not present

## 2020-08-09 LAB — CBC WITH DIFFERENTIAL/PLATELET
Abs Immature Granulocytes: 0.02 10*3/uL (ref 0.00–0.07)
Basophils Absolute: 0.1 10*3/uL (ref 0.0–0.1)
Basophils Relative: 1 %
Eosinophils Absolute: 0.1 10*3/uL (ref 0.0–0.5)
Eosinophils Relative: 2 %
HCT: 38.2 % (ref 36.0–46.0)
Hemoglobin: 12.4 g/dL (ref 12.0–15.0)
Immature Granulocytes: 0 %
Lymphocytes Relative: 31 %
Lymphs Abs: 2.5 10*3/uL (ref 0.7–4.0)
MCH: 28.9 pg (ref 26.0–34.0)
MCHC: 32.5 g/dL (ref 30.0–36.0)
MCV: 89 fL (ref 80.0–100.0)
Monocytes Absolute: 0.7 10*3/uL (ref 0.1–1.0)
Monocytes Relative: 8 %
Neutro Abs: 4.6 10*3/uL (ref 1.7–7.7)
Neutrophils Relative %: 58 %
Platelets: 301 10*3/uL (ref 150–400)
RBC: 4.29 MIL/uL (ref 3.87–5.11)
RDW: 16.5 % — ABNORMAL HIGH (ref 11.5–15.5)
WBC: 8 10*3/uL (ref 4.0–10.5)
nRBC: 0 % (ref 0.0–0.2)

## 2020-08-09 LAB — COMPREHENSIVE METABOLIC PANEL
ALT: 27 U/L (ref 0–44)
AST: 21 U/L (ref 15–41)
Albumin: 4 g/dL (ref 3.5–5.0)
Alkaline Phosphatase: 109 U/L (ref 38–126)
Anion gap: 12 (ref 5–15)
BUN: 21 mg/dL — ABNORMAL HIGH (ref 6–20)
CO2: 23 mmol/L (ref 22–32)
Calcium: 8.9 mg/dL (ref 8.9–10.3)
Chloride: 102 mmol/L (ref 98–111)
Creatinine, Ser: 0.73 mg/dL (ref 0.44–1.00)
GFR, Estimated: >60 mL/min (ref >60)
Glucose, Bld: 126 mg/dL — ABNORMAL HIGH (ref 70–99)
Potassium: 3.6 mmol/L (ref 3.5–5.1)
Sodium: 137 mmol/L (ref 135–145)
Total Bilirubin: 0.7 mg/dL (ref 0.3–1.2)
Total Protein: 7.1 g/dL (ref 6.5–8.1)

## 2020-08-09 MED ORDER — HEPARIN SOD (PORK) LOCK FLUSH 100 UNIT/ML IV SOLN
500.0000 [IU] | Freq: Once | INTRAVENOUS | Status: AC | PRN
  Administered 2020-08-09: 500 [IU]

## 2020-08-09 MED ORDER — FAMOTIDINE 20 MG IN NS 100 ML IVPB
20.0000 mg | Freq: Once | INTRAVENOUS | Status: AC
  Administered 2020-08-09: 20 mg via INTRAVENOUS
  Filled 2020-08-09: qty 20

## 2020-08-09 MED ORDER — DIPHENHYDRAMINE HCL 50 MG/ML IJ SOLN
25.0000 mg | Freq: Once | INTRAMUSCULAR | Status: AC
  Administered 2020-08-09: 25 mg via INTRAVENOUS
  Filled 2020-08-09: qty 1

## 2020-08-09 MED ORDER — SODIUM CHLORIDE 0.9 % IV SOLN
140.0000 mg/m2 | Freq: Once | INTRAVENOUS | Status: AC
  Administered 2020-08-09: 306 mg via INTRAVENOUS
  Filled 2020-08-09: qty 51

## 2020-08-09 MED ORDER — SODIUM CHLORIDE 0.9 % IV SOLN: Freq: Once | INTRAVENOUS | Status: AC

## 2020-08-09 MED ORDER — SODIUM CHLORIDE 0.9% FLUSH
10.0000 mL | INTRAVENOUS | Status: DC | PRN
  Administered 2020-08-09: 10 mL

## 2020-08-09 MED ORDER — SODIUM CHLORIDE 0.9 % IV SOLN
10.0000 mg | Freq: Once | INTRAVENOUS | Status: AC
  Administered 2020-08-09: 10 mg via INTRAVENOUS
  Filled 2020-08-09: qty 10

## 2020-08-09 MED ORDER — LIDOCAINE-PRILOCAINE 2.5-2.5 % EX CREA: TOPICAL_CREAM | CUTANEOUS | 0 refills | Status: DC

## 2020-08-09 MED ORDER — PALONOSETRON HCL INJECTION 0.25 MG/5ML
0.2500 mg | Freq: Once | INTRAVENOUS | Status: AC
  Administered 2020-08-09: 0.25 mg via INTRAVENOUS
  Filled 2020-08-09: qty 5

## 2020-08-09 NOTE — Patient Instructions (Signed)
[facility]  Discharge Instructions: Thank you for choosing [facility] to provide your oncology and hematology care.  If you have a lab appointment with the [facility], please come in thru the Main Entrance and check in at the main information desk.  Wear comfortable clothing and clothing appropriate for easy access to any Portacath or PICC line.   We strive to give you quality time with your provider. You may need to reschedule your appointment if you arrive late (15 or more minutes).  Arriving late affects you and other patients whose appointments are after yours.  Also, if you miss three or more appointments without notifying the office, you may be dismissed from the clinic at the provider's discretion.      For prescription refill requests, have your pharmacy contact our office and allow 72 hours for refills to be completed.    Today you received the following chemotherapy and/or immunotherapy agents Taxol.       To help prevent nausea and vomiting after your treatment, we encourage you to take your nausea medication as directed.  BELOW ARE SYMPTOMS THAT SHOULD BE REPORTED IMMEDIATELY: *FEVER GREATER THAN 100.4 F (38 C) OR HIGHER *CHILLS OR SWEATING *NAUSEA AND VOMITING THAT IS NOT CONTROLLED WITH YOUR NAUSEA MEDICATION *UNUSUAL SHORTNESS OF BREATH *UNUSUAL BRUISING OR BLEEDING *URINARY PROBLEMS (pain or burning when urinating, or frequent urination) *BOWEL PROBLEMS (unusual diarrhea, constipation, pain near the anus) TENDERNESS IN MOUTH AND THROAT WITH OR WITHOUT PRESENCE OF ULCERS (sore throat, sores in mouth, or a toothache) UNUSUAL RASH, SWELLING OR PAIN  UNUSUAL VAGINAL DISCHARGE OR ITCHING   Items with * indicate a potential emergency and should be followed up as soon as possible or go to the Emergency Department if any problems should occur.  Please show the CHEMOTHERAPY ALERT CARD or IMMUNOTHERAPY ALERT CARD at check-in to the Emergency  Department and triage nurse.  Should you have questions after your visit or need to cancel or reschedule your appointment, please contact [facility] [phone]  and follow the prompts.  Office hours are 8:00 a.m. to 4:30 p.m. Monday - Friday. Please note that voicemails left after 4:00 p.m. may not be returned until the following business day.  We are closed weekends and major holidays. You have access to a nurse at all times for urgent questions. Please call the main number to the clinic [phone] and follow the prompts.  For any non-urgent questions, you may also contact your provider using MyChart. We now offer e-Visits for anyone 44 and older to request care online for non-urgent symptoms. For details visit [email or web address].   Also download the MyChart app! Go to the app store, search "MyChart", open the app, select [facility], and log in with your MyChart username and password.  Due to Covid, a mask is required upon entering the hospital/clinic. If you do not have a mask, one will be given to you upon arrival. For doctor visits, patients may have 1 support person aged 75 or older with them. For treatment visits, patients cannot have anyone with them due to current Covid guidelines and our immunocompromised population.

## 2020-08-09 NOTE — Progress Notes (Signed)
Patient presents today for treatment and follow up visit with Dr. Rosanne Ashing. Labs pending. Vital signs within parameters for treatment today. MAR reviewed. Patient denies any changes since her last treatment. Patient has complaints of numbness in her feet which she states has not increased in severity but her legs feel week per patient's words. No complaints today.   Labs within parameters for treatment. Reviewed by Kizzie Furnish RN.   Message received from [facility] / Dr. Delton Coombes to proceed with treatment. Labs reviewed.   Treatment given today per MD orders. Tolerated infusion without adverse affects. Vital signs stable. No complaints at this time. Discharged from clinic ambulatory in stable condition. Alert and oriented x 3. F/U with [facility] as scheduled.

## 2020-08-09 NOTE — Progress Notes (Signed)
Patient has been assessed, vital signs and labs have been reviewed by Dr. Katragadda. ANC, Creatinine, LFTs, and Platelets are within treatment parameters per Dr. Katragadda. The patient is good to proceed with treatment at this time. Primary RN and pharmacy aware.  

## 2020-08-09 NOTE — Patient Instructions (Addendum)
[facility] at [facility] Discharge Instructions  You were seen today by Dr. Delton Coombes. He went over your recent results, and you received your treatment. You will be scheduled for a CT scan of your abdomen and pelvis prior to your next appointment. Dr. Delton Coombes will see you back in 6 weeks for labs and follow up.   Thank you for choosing [facility] at [name] to provide your oncology and hematology care.  To afford each patient quality time with our provider, please arrive at least 15 minutes before your scheduled appointment time.   If you have a lab appointment with the [facility] please come in thru the Main Entrance and check in at the main information desk  You need to re-schedule your appointment should you arrive 10 or more minutes late.  We strive to give you quality time with our providers, and arriving late affects you and other patients whose appointments are after yours.  Also, if you no show three or more times for appointments you may be dismissed from the clinic at the providers discretion.     Again, thank you for choosing [facility].  Our hope is that these requests will decrease the amount of time that you wait before being seen by our physicians.       _____________________________________________________________  Should you have questions after your visit to [facility], please contact our office at [phone] between the hours of 8:00 a.m. and 4:30 p.m.  Voicemails left after 4:00 p.m. will not be returned until the following business day.  For prescription refill requests, have your pharmacy contact our office and allow 72 hours.    Cancer Center Support Programs:   > Cancer Support Group  2nd Tuesday of the month 1pm-2pm, Journey Room

## 2020-08-10 LAB — CA 125: Cancer Antigen (CA) 125: 6.3 U/mL (ref 0.0–38.1)

## 2020-08-30 ENCOUNTER — Inpatient Hospital Stay (HOSPITAL_COMMUNITY): Payer: Medicaid Other | Attending: Hematology

## 2020-08-30 ENCOUNTER — Ambulatory Visit (HOSPITAL_COMMUNITY): Payer: Medicaid Other | Admitting: Hematology

## 2020-08-30 ENCOUNTER — Other Ambulatory Visit: Payer: Self-pay

## 2020-08-30 ENCOUNTER — Inpatient Hospital Stay (HOSPITAL_COMMUNITY): Payer: Medicaid Other

## 2020-08-30 VITALS — BP 136/76 | HR 71 | Temp 97.7°F | Resp 18

## 2020-08-30 DIAGNOSIS — Z90722 Acquired absence of ovaries, bilateral: Secondary | ICD-10-CM | POA: Insufficient documentation

## 2020-08-30 DIAGNOSIS — G620 Drug-induced polyneuropathy: Secondary | ICD-10-CM | POA: Diagnosis not present

## 2020-08-30 DIAGNOSIS — T451X5A Adverse effect of antineoplastic and immunosuppressive drugs, initial encounter: Secondary | ICD-10-CM | POA: Diagnosis not present

## 2020-08-30 DIAGNOSIS — Z90710 Acquired absence of both cervix and uterus: Secondary | ICD-10-CM | POA: Diagnosis not present

## 2020-08-30 DIAGNOSIS — C541 Malignant neoplasm of endometrium: Secondary | ICD-10-CM

## 2020-08-30 DIAGNOSIS — Z5111 Encounter for antineoplastic chemotherapy: Secondary | ICD-10-CM | POA: Diagnosis present

## 2020-08-30 DIAGNOSIS — Z79899 Other long term (current) drug therapy: Secondary | ICD-10-CM | POA: Diagnosis not present

## 2020-08-30 DIAGNOSIS — Z9079 Acquired absence of other genital organ(s): Secondary | ICD-10-CM | POA: Diagnosis not present

## 2020-08-30 DIAGNOSIS — Z95828 Presence of other vascular implants and grafts: Secondary | ICD-10-CM

## 2020-08-30 DIAGNOSIS — C786 Secondary malignant neoplasm of retroperitoneum and peritoneum: Secondary | ICD-10-CM | POA: Diagnosis present

## 2020-08-30 LAB — CBC WITH DIFFERENTIAL/PLATELET
Abs Immature Granulocytes: 0.05 10*3/uL (ref 0.00–0.07)
Basophils Absolute: 0.1 10*3/uL (ref 0.0–0.1)
Basophils Relative: 1 %
Eosinophils Absolute: 0.1 10*3/uL (ref 0.0–0.5)
Eosinophils Relative: 1 %
HCT: 37.1 % (ref 36.0–46.0)
Hemoglobin: 12 g/dL (ref 12.0–15.0)
Immature Granulocytes: 1 %
Lymphocytes Relative: 26 %
Lymphs Abs: 2.2 10*3/uL (ref 0.7–4.0)
MCH: 28.6 pg (ref 26.0–34.0)
MCHC: 32.3 g/dL (ref 30.0–36.0)
MCV: 88.3 fL (ref 80.0–100.0)
Monocytes Absolute: 0.6 10*3/uL (ref 0.1–1.0)
Monocytes Relative: 7 %
Neutro Abs: 5.4 10*3/uL (ref 1.7–7.7)
Neutrophils Relative %: 64 %
Platelets: 305 10*3/uL (ref 150–400)
RBC: 4.2 MIL/uL (ref 3.87–5.11)
RDW: 16.1 % — ABNORMAL HIGH (ref 11.5–15.5)
WBC: 8.4 10*3/uL (ref 4.0–10.5)
nRBC: 0 % (ref 0.0–0.2)

## 2020-08-30 LAB — COMPREHENSIVE METABOLIC PANEL
ALT: 26 U/L (ref 0–44)
AST: 23 U/L (ref 15–41)
Albumin: 3.9 g/dL (ref 3.5–5.0)
Alkaline Phosphatase: 107 U/L (ref 38–126)
Anion gap: 6 (ref 5–15)
BUN: 19 mg/dL (ref 6–20)
CO2: 24 mmol/L (ref 22–32)
Calcium: 8.9 mg/dL (ref 8.9–10.3)
Chloride: 106 mmol/L (ref 98–111)
Creatinine, Ser: 0.64 mg/dL (ref 0.44–1.00)
GFR, Estimated: >60 mL/min (ref >60)
Glucose, Bld: 129 mg/dL — ABNORMAL HIGH (ref 70–99)
Potassium: 3.8 mmol/L (ref 3.5–5.1)
Sodium: 136 mmol/L (ref 135–145)
Total Bilirubin: 0.5 mg/dL (ref 0.3–1.2)
Total Protein: 7 g/dL (ref 6.5–8.1)

## 2020-08-30 MED ORDER — HEPARIN SOD (PORK) LOCK FLUSH 100 UNIT/ML IV SOLN
500.0000 [IU] | Freq: Once | INTRAVENOUS | Status: AC | PRN
  Administered 2020-08-30: 500 [IU]

## 2020-08-30 MED ORDER — SODIUM CHLORIDE 0.9% FLUSH
10.0000 mL | INTRAVENOUS | Status: DC | PRN
  Administered 2020-08-30: 10 mL

## 2020-08-30 MED ORDER — DIPHENHYDRAMINE HCL 50 MG/ML IJ SOLN
25.0000 mg | Freq: Once | INTRAMUSCULAR | Status: AC
  Administered 2020-08-30: 25 mg via INTRAVENOUS

## 2020-08-30 MED ORDER — FAMOTIDINE 20 MG IN NS 100 ML IVPB
20.0000 mg | Freq: Once | INTRAVENOUS | Status: AC
  Administered 2020-08-30: 20 mg via INTRAVENOUS
  Filled 2020-08-30: qty 20

## 2020-08-30 MED ORDER — SODIUM CHLORIDE 0.9 % IV SOLN: Freq: Once | INTRAVENOUS | Status: AC

## 2020-08-30 MED ORDER — SODIUM CHLORIDE 0.9 % IV SOLN
10.0000 mg | Freq: Once | INTRAVENOUS | Status: AC
  Administered 2020-08-30: 10 mg via INTRAVENOUS
  Filled 2020-08-30: qty 10

## 2020-08-30 MED ORDER — SODIUM CHLORIDE 0.9 % IV SOLN
140.0000 mg/m2 | Freq: Once | INTRAVENOUS | Status: AC
  Administered 2020-08-30: 306 mg via INTRAVENOUS
  Filled 2020-08-30: qty 51

## 2020-08-30 MED ORDER — PALONOSETRON HCL INJECTION 0.25 MG/5ML
INTRAVENOUS | Status: DC
  Filled 2020-08-30: qty 5

## 2020-08-30 MED ORDER — PALONOSETRON HCL INJECTION 0.25 MG/5ML
0.2500 mg | Freq: Once | INTRAVENOUS | Status: AC
  Administered 2020-08-30: 0.25 mg via INTRAVENOUS

## 2020-08-30 MED ORDER — DIPHENHYDRAMINE HCL 50 MG/ML IJ SOLN
INTRAMUSCULAR | Status: DC
  Filled 2020-08-30: qty 1

## 2020-08-30 NOTE — Progress Notes (Signed)
Patient presents today for Taxol per providers order.  Vital signs within parameters for treatment.  Labs pending.  Patient has no new complaints at this time.  Labs reviewed and within parameters for treatment.  Taxol given today per MD orders.  Stable during infusion without adverse affects.  Vital signs stable.  No complaints at this time.  Discharge from clinic ambulatory in stable condition.  Alert and oriented X 3.  Follow up with [facility] as scheduled.

## 2020-08-30 NOTE — Progress Notes (Signed)
Patients port flushed without difficulty.  Good blood return noted with no bruising or swelling noted at site.  Stable during access and blood draw.  Patient to remain accessed for treatment. 

## 2020-08-30 NOTE — Patient Instructions (Signed)
[facility]  Discharge Instructions: Thank you for choosing [facility] to provide your oncology and hematology care.  If you have a lab appointment with the [facility], please come in thru the Main Entrance and check in at the main information desk.  Wear comfortable clothing and clothing appropriate for easy access to any Portacath or PICC line.   We strive to give you quality time with your provider. You may need to reschedule your appointment if you arrive late (15 or more minutes).  Arriving late affects you and other patients whose appointments are after yours.  Also, if you miss three or more appointments without notifying the office, you may be dismissed from the clinic at the provider's discretion.      For prescription refill requests, have your pharmacy contact our office and allow 72 hours for refills to be completed.    Today you received the following chemotherapy and/or immunotherapy agents Padcev      To help prevent nausea and vomiting after your treatment, we encourage you to take your nausea medication as directed.  BELOW ARE SYMPTOMS THAT SHOULD BE REPORTED IMMEDIATELY: *FEVER GREATER THAN 100.4 F (38 C) OR HIGHER *CHILLS OR SWEATING *NAUSEA AND VOMITING THAT IS NOT CONTROLLED WITH YOUR NAUSEA MEDICATION *UNUSUAL SHORTNESS OF BREATH *UNUSUAL BRUISING OR BLEEDING *URINARY PROBLEMS (pain or burning when urinating, or frequent urination) *BOWEL PROBLEMS (unusual diarrhea, constipation, pain near the anus) TENDERNESS IN MOUTH AND THROAT WITH OR WITHOUT PRESENCE OF ULCERS (sore throat, sores in mouth, or a toothache) UNUSUAL RASH, SWELLING OR PAIN  UNUSUAL VAGINAL DISCHARGE OR ITCHING   Items with * indicate a potential emergency and should be followed up as soon as possible or go to the Emergency Department if any problems should occur.  Please show the CHEMOTHERAPY ALERT CARD or IMMUNOTHERAPY ALERT CARD at check-in to the Emergency  Department and triage nurse.  Should you have questions after your visit or need to cancel or reschedule your appointment, please contact [facility] [phone]  and follow the prompts.  Office hours are 8:00 a.m. to 4:30 p.m. Monday - Friday. Please note that voicemails left after 4:00 p.m. may not be returned until the following business day.  We are closed weekends and major holidays. You have access to a nurse at all times for urgent questions. Please call the main number to the clinic [phone] and follow the prompts.  For any non-urgent questions, you may also contact your provider using MyChart. We now offer e-Visits for anyone 24 and older to request care online for non-urgent symptoms. For details visit [email or web address].   Also download the MyChart app! Go to the app store, search "MyChart", open the app, select [facility], and log in with your MyChart username and password.  Due to Covid, a mask is required upon entering the hospital/clinic. If you do not have a mask, one will be given to you upon arrival. For doctor visits, patients may have 1 support person aged 26 or older with them. For treatment visits, patients cannot have anyone with them due to current Covid guidelines and our immunocompromised population.

## 2020-09-17 ENCOUNTER — Ambulatory Visit (HOSPITAL_COMMUNITY)
Admission: RE | Admit: 2020-09-17 | Discharge: 2020-09-17 | Disposition: A | Discharge status: Home / Self Care | Payer: Medicaid Other | Source: Ambulatory Visit | Attending: Hematology | Admitting: Hematology

## 2020-09-17 ENCOUNTER — Other Ambulatory Visit: Payer: Self-pay

## 2020-09-17 DIAGNOSIS — C541 Malignant neoplasm of endometrium: Secondary | ICD-10-CM

## 2020-09-17 MED ORDER — IOHEXOL 350 MG/ML SOLN
80.0000 mL | Freq: Once | INTRAVENOUS | Status: AC | PRN
  Administered 2020-09-17: 80 mL via INTRAVENOUS

## 2020-09-19 NOTE — Progress Notes (Signed)
[place],  [address]   CLINIC:  Medical Oncology/Hematology  PCP:  Perlie Mayo, NP [address] / [place] [address] [phone]   REASON FOR VISIT:  Follow-up for recurrent endometrial cancer  PRIOR THERAPY: TAH & BSO, bilateral pelvic and para-aortic lymph nodes dissections on 04/13/2018  NGS Results: not done  CURRENT THERAPY: Paclitaxel & Aloxi every 3 weeks  BRIEF ONCOLOGIC HISTORY:  Oncology History  Endometrial cancer ([facility])  03/31/2018 Initial Diagnosis   Endometrial cancer ([facility])   12/29/2018 -  Chemotherapy    Patient is on Treatment Plan: UTERINE CARBOPLATIN AUC 6 / PACLITAXEL Q21D       06/06/2020 Miscellaneous   Summary of oncologic history from Dr. Delton Coombes   1.  Recurrent endometrial carcinosarcoma: -TAH, BSO, bilateral pelvic and para-aortic lymph node resections on 04/13/2018. -Pathology showed carcinosarcoma (malignant mixed mullerian tumor) is arising in an endometrial type polyp with no myometrial invasion identified.  High-grade.  0/39 lymph nodes positive.  PT1APN0, FIGO stage Ia.  MMR normal.  MSI-stable. -CTAP on 12/09/2018 for abdominal pain showed extensive peritoneal carcinomatosis and large soft tissue mass in the pelvis. -Biopsy of the omental mass on 12/28/2018 shows poorly differentiated carcinoma consistent with her prior malignancy. -Carboplatin and paclitaxel started on 12/29/2018. -CT scan on 05/02/2019 showed peritoneal implants in the low central small bowel mesentery and left pelvic sidewall have decreased in size measuring 1.5 x 1.9 cm and 1.7 x 2.2 cm.  Soft tissue nodule in the left lateral omentum measures 1 x 1.7 cm with no evidence of metastatic disease. -Continuation of chemotherapy until complete response was recommended. -CT AP on 07/04/2019 showed continued positive response to therapy with scattered peritoneal metastasis in the pelvis and left omentum decreased in size.  No new  metastatic disease. -CTAP on 10/24/2019 after 14 cycles showed substantial reduction in size of soft tissue nodules in the pelvis.  1.4 x 0.9 x 1 cm soft tissue density in the inferior serosal margin of the sigmoid colon, previously 1.7 x 1.1 x 1.7 cm.  2 small soft tissue nodules in the mesenteric adipose tissue above the urinary bladder measures 0.8 cm. -She has developed serious allergic reaction during cycle 14 with carboplatin. -I have discussed with Dr. Denman George.  Other options include adding ifosfamide to Taxol or Doxil and combination of lenvatinib with pembrolizumab.  In the lenvatinib pembrolizumab trial carcinosarcomas were not included. -Single agent paclitaxel started on 11/09/2019, dose reduced by 20% on 01/18/2020. -CTAP on 01/10/2020 with stable small peritoneal soft tissue nodules in the pelvis.  Resolution of soft tissue nodularity in the left abdominal omental fat with no new or progressive disease.     06/20/2020 Imaging   1. Stable examination status post hysterectomy without suspicious enhancing soft tissue nodularity at the vaginal cuff and no significant change in the multiple small soft tissue nodules in the pelvis likely representing treated tumor. No new or progressive findings. 2. Cholelithiasis including a large gallstone in the neck of the gallbladder but without CT evidence of acute cholecystitis. 3. Small hiatal hernia. 4. Aortic atherosclerosis.       CANCER STAGING: Cancer Staging No matching staging information was found for the patient.  INTERVAL HISTORY:  Jessica [name], a 60 y.o. female, returns for routine follow-up and consideration for next cycle of chemotherapy. Jessica [name] was last seen on 08/09/2020.  Due for cycle #29 of Taxol today.   Overall, she tells me she has  been feeling pretty well. She reports increasing but currently tolerable aching right shin pain for the past 2 months. She also reports intermittent right side back pain for which she takes  oxycodone 3-4 times a week. She denies any bleeding and reports normal BM besides occasional diarrhea. She reports constant tingling in her feet which is worse in the right foot and is helped with 2-3 300 mg Gabapentin daily. She is currently working part-time at [employer] and spends much of the day on her feet. She reports n/v/d for 4-5 days following treatment.  Overall, she feels ready for next cycle of chemo today.   REVIEW OF SYSTEMS:  Review of Systems  Constitutional:  Positive for fatigue (50%). Negative for appetite change.  HENT:   Negative for nosebleeds.   Gastrointestinal:  Positive for diarrhea (mild), nausea (for 4 days after chemo) and vomiting. Negative for blood in stool and constipation.  Musculoskeletal:  Positive for back pain (R side) and myalgias (bilateral legs).  Hematological:  Does not bruise/bleed easily.  All other systems reviewed and are negative.  PAST MEDICAL/SURGICAL HISTORY:  Past Medical History:  Diagnosis Date   Cancer ([facility])    Phreesia 09/05/2019   Elevated blood pressure reading    Endometrial cancer ([facility])    Hyperlipidemia    Hypertension    PMB (postmenopausal bleeding)    Port-A-Cath in place 12/24/2018   Past Surgical History:  Procedure Laterality Date   ABDOMINAL HYSTERECTOMY N/A    Phreesia 09/05/2019   IR IMAGING GUIDED PORT INSERTION  12/28/2018   IR RADIOLOGIST EVAL & MGMT  06/02/2018   IR RADIOLOGIST EVAL & MGMT  06/16/2018   NECK SURGERY  2000   spinal surgery , titanium rod in place    ROBOTIC ASSISTED TOTAL HYSTERECTOMY WITH BILATERAL SALPINGO OOPHERECTOMY N/A 04/13/2018   Procedure: XI ROBOTIC [place];  Surgeon: Everitt Amber, MD;  Location: [facility];  Service: Gynecology;  Laterality: N/A;   ROBOTIC PELVIC AND PARA-AORTIC LYMPH NODE DISSECTION N/A 04/13/2018   Procedure: XI ROBOTIC PELVIC LYMPHADECTOMY AND PARA-AORTIC LYMPH NODE DISSECTION;  Surgeon: Everitt Amber, MD;   Location: [facility];  Service: Gynecology;  Laterality: N/A;    SOCIAL HISTORY:  Social History   Socioeconomic History   Marital status: Widowed    Spouse name: Not on file   Number of children: 3   Years of education: Not on file   Highest education level: Not on file  Occupational History   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  Tobacco Use   Smoking status: Never   Smokeless tobacco: Never  Vaping Use   Vaping Use: Never used  Substance and Sexual Activity   Alcohol use: Yes    Alcohol/week: 4.0 standard drinks    Types: 4 Glasses of wine per week    Comment: weekends   Drug use: Never   Sexual activity: Not Currently  Other Topics Concern   Not on file  Social History Narrative   Lives with daughter and granddaughter here in [place], moved 3 years ago      Cats: Zoe-76 years old, Eduard Clos, and Horald Pollen      Enjoys: walking when she can tolerate- spending time with granddaughter (her life)      Diet: eats all food groups   Caffeine: 1-2 cups at times, but does not tolerate it all the time   Water: 160 oz daily      Wears seat belt  Does not use phone while driving    Oceanographer at home   Fire extinguisher-no   No weapons   Social Determinants of Health   Financial Resource Strain: Not on file  Food Insecurity: Not on file  Transportation Needs: Not on file  Physical Activity: Inactive   Days of Exercise per Week: 0 days   Minutes of Exercise per Session: 0 min  Stress: Not on file  Social Connections: Not on file  Intimate Partner Violence: Not on file    FAMILY HISTORY:  Family History  Problem Relation Age of Onset   Hypertension Father    Heart disease Father    Cancer Father        lung   Hypertension Mother    Cancer Sister        ovarian cancer    CURRENT MEDICATIONS:  Current Outpatient Medications  Medication Sig Dispense Refill   amLODipine (NORVASC) 5 MG tablet Take 1 tablet (5 mg total) by mouth daily. 90 tablet 1    atorvastatin (LIPITOR) 20 MG tablet Take 1 tablet by mouth once daily 90 tablet 0   EPINEPHrine 0.3 mg/0.3 mL IJ SOAJ injection Inject 0.3 mg into the muscle as needed for anaphylaxis. 1 each 0   gabapentin (NEURONTIN) 300 MG capsule Take 1 capsule (300 mg total) by mouth 3 (three) times daily. 90 capsule 6   lidocaine-prilocaine (EMLA) cream Apply to port site prior to use 30 g 0   oxyCODONE-acetaminophen (PERCOCET) 5-325 MG tablet Take 1 tablet by mouth every 12 (twelve) hours as needed (pain). 60 tablet 0   PACLITAXEL IV Inject into the vein every 21 ( twenty-one) days.     prochlorperazine (COMPAZINE) 10 MG tablet Take 1 tablet (10 mg total) by mouth every 6 (six) hours as needed (Nausea or vomiting). 60 tablet 3   No current facility-administered medications for this visit.   Facility-Administered Medications Ordered in Other Visits  Medication Dose Route Frequency Provider Last Rate Last Admin   diphenhydrAMINE (BENADRYL) 50 MG/ML injection            palonosetron (ALOXI) 0.25 MG/5ML injection             ALLERGIES:  Allergies  Allergen Reactions   Carboplatin Other (See Comments)    "body on fire and abdominal pain"   Nickel Rash    itchy    PHYSICAL EXAM:  Performance status (ECOG): 1 - Symptomatic but completely ambulatory  There were no vitals filed for this visit. Wt Readings from Last 3 Encounters:  09/20/20 238 lb 1.6 oz (108 kg)  08/30/20 237 lb (107.5 kg)  08/09/20 234 lb 3.2 oz (106.2 kg)   Physical Exam Vitals reviewed.  Constitutional:      Appearance: Normal appearance.  Cardiovascular:     Rate and Rhythm: Normal rate and regular rhythm.     Pulses: Normal pulses.     Heart sounds: Normal heart sounds.  Pulmonary:     Effort: Pulmonary effort is normal.     Breath sounds: Normal breath sounds.  Musculoskeletal:     Right lower leg: Tenderness present. No edema.     Left lower leg: No edema.  Neurological:     General: No focal deficit present.      Mental Status: She is alert and oriented to person, place, and time.  Psychiatric:        Mood and Affect: Mood normal.        Behavior: Behavior normal.  LABORATORY DATA:  I have reviewed the labs as listed.  CBC Latest Ref Rng & Units 09/20/2020 08/30/2020 08/09/2020  WBC 4.0 - 10.5 K/uL 9.7 8.4 8.0  Hemoglobin 12.0 - 15.0 g/dL 11.9(L) 12.0 12.4  Hematocrit 36.0 - 46.0 % 35.8(L) 37.1 38.2  Platelets 150 - 400 K/uL 310 305 301   CMP Latest Ref Rng & Units 08/30/2020 08/09/2020 07/18/2020  Glucose 70 - 99 mg/dL 129(H) 126(H) 110(H)  BUN 6 - 20 mg/dL 19 21(H) 18  Creatinine 0.44 - 1.00 mg/dL 0.64 0.73 0.60  Sodium 135 - 145 mmol/L 136 137 140  Potassium 3.5 - 5.1 mmol/L 3.8 3.6 3.9  Chloride 98 - 111 mmol/L 106 102 108  CO2 22 - 32 mmol/L 24 23 26   Calcium 8.9 - 10.3 mg/dL 8.9 8.9 9.2  Total Protein 6.5 - 8.1 g/dL 7.0 7.1 7.0  Total Bilirubin 0.3 - 1.2 mg/dL 0.5 0.7 0.3  Alkaline Phos 38 - 126 U/L 107 109 96  AST 15 - 41 U/L 23 21 17   ALT 0 - 44 U/L 26 27 22     DIAGNOSTIC IMAGING:  I have independently reviewed the scans and discussed with the patient. CT ABDOMEN PELVIS W CONTRAST  Result Date: 09/17/2020 CLINICAL DATA:  History of endometrial cancer, status post hysterectomy. Restaging. EXAM: CT ABDOMEN AND PELVIS WITH CONTRAST TECHNIQUE: Multidetector CT imaging of the abdomen and pelvis was performed using the standard protocol following bolus administration of intravenous contrast. CONTRAST:  40m OMNIPAQUE IOHEXOL 350 MG/ML SOLN COMPARISON:  Multiple priors including most recent CT June 20, 2020. FINDINGS: Lower chest: No acute abnormality. Hepatobiliary: No suspicious hepatic lesion. Cholelithiasis measuring up to 1.4 cm in the neck with similar distension of the gallbladder without adjacent inflammation. No biliary ductal dilation. Pancreas: Within normal limits. Spleen: Within normal limits. Adrenals/Urinary Tract: Adrenal glands are unremarkable. Kidneys are normal, without renal  calculi, solid enhancing lesion, or hydronephrosis. Bladder is unremarkable for degree of distension. Stomach/Bowel: Moderate hiatal hernia otherwise the stomach is unremarkable. Radiopaque enteric contrast traverses the rectum. No pathologic dilation of small or large bowel. No evidence of acute bowel inflammation. Normal appendix. Vascular/Lymphatic: Aortic atherosclerosis without aneurysmal dilation. No gastrohepatic or hepatoduodenal ligament lymphadenopathy. No retroperitoneal or mesenteric lymphadenopathy. No pelvic sidewall lymphadenopathy. No groin lymphadenopathy. Reproductive: Uterus is surgically absent. There is no suspicious enhancing soft tissue nodularity along the vaginal cuff. No suspicious adnexal masses. Other: No significant interval change in the multiple 1 cm or smaller soft tissue nodules throughout the pelvis, with the previously index largest nodule along the left side of the mid sigmoid colon measuring 10 mm on image 70/2, unchanged. No abdominopelvic ascites. No omental caking or new areas of suspicious nodularity. Musculoskeletal: Multilevel degenerative changes spine. No aggressive lytic or blastic lesion of bone. IMPRESSION: 1. Stable examination status post hysterectomy without evidence local recurrence and no significant interval change in the multiple soft tissue nodules throughout the pelvis, likely representing treated tumor. 2. No new or progressive findings in the abdomen or pelvis 3. Cholelithiasis without evidence of acute cholecystitis. 4. Moderate hiatal hernia. 5.  Aortic Atherosclerosis (ICD10-I70.0). Electronically Signed   By: JDahlia BailiffM.D.   On: 09/17/2020 14:00     ASSESSMENT:  1.  Recurrent endometrial carcinosarcoma: -TAH, BSO, bilateral pelvic and para-aortic lymph node resections on 04/13/2018. -Pathology showed carcinosarcoma (malignant mixed mullerian tumor) is arising in an endometrial type polyp with no myometrial invasion identified.  High-grade.   0/39 lymph nodes positive.  PT1APN0, FIGO  stage Ia.  MMR normal.  MSI-stable. -CTAP on 12/09/2018 for abdominal pain showed extensive peritoneal carcinomatosis and large soft tissue mass in the pelvis. -Biopsy of the omental mass on 12/28/2018 shows poorly differentiated carcinoma consistent with her prior malignancy. -Carboplatin and paclitaxel started on 12/29/2018. -CT scan on 05/02/2019 showed peritoneal implants in the low central small bowel mesentery and left pelvic sidewall have decreased in size measuring 1.5 x 1.9 cm and 1.7 x 2.2 cm.  Soft tissue nodule in the left lateral omentum measures 1 x 1.7 cm with no evidence of metastatic disease. -Continuation of chemotherapy until complete response was recommended. -CT AP on 07/04/2019 showed continued positive response to therapy with scattered peritoneal metastasis in the pelvis and left omentum decreased in size.  No new metastatic disease. -CTAP on 10/24/2019 after 14 cycles showed substantial reduction in size of soft tissue nodules in the pelvis.  1.4 x 0.9 x 1 cm soft tissue density in the inferior serosal margin of the sigmoid colon, previously 1.7 x 1.1 x 1.7 cm.  2 small soft tissue nodules in the mesenteric adipose tissue above the urinary bladder measures 0.8 cm. -She has developed serious allergic reaction during cycle 14 with carboplatin. -I have discussed with Dr. Denman George.  Other options include adding ifosfamide to Taxol or Doxil and combination of lenvatinib with pembrolizumab.  In the lenvatinib pembrolizumab trial carcinosarcomas were not included. -Single agent paclitaxel started on 11/09/2019, dose reduced by 20% on 01/18/2020. -CTAP on 01/10/2020 with stable small peritoneal soft tissue nodules in the pelvis.  Resolution of soft tissue nodularity in the left abdominal omental fat with no new or progressive disease.   2.  Breast abnormality: -Mammogram on 05/02/2019 shows BI-RADS Category 0.  She reportedly took Covid shot 1 to 2  weeks prior to the mammogram.  Further scans rescheduled on 06/21/2019.   3.  Peripheral neuropathy: -She reported numbness and occasional pins-and-needles sensation in the bilateral toes, right more than left.  This has started about a week ago.   PLAN:  1.  Recurrent endometrial carcinosarcoma: - She has tolerated last cycle of treatment very well. - We reviewed CT AP from 09/17/2020 which showed multiple subcentimeter pelvic soft tissue nodules which are stable.  CA125 is normal. - She reports having right lateral leg pain which is achy for the last couple of months.  This pain is worse in the mornings and gets better as the day progresses. - She is continuing to work couple of days a week in [employer]. - I have reviewed her labs which shows normal CBC.  LFTs are normal.  Magnesium is slightly low at 1.6.  CA125 was 6.7. - She will proceed with her treatment today at 20% dose reduction. - I will reach out to Dr. Denman George to see if anything else can be offered to this patient other than continuing paclitaxel. - RTC 3 weeks with labs and treatment.   2.  Low back pain/right-sided abdominal pain: - She has bilateral hip pains on and off. - Continue oxycodone 5/325 3-4 times per week.   3.  Hypertension: - Continue Norvasc 5 mg daily.  Blood pressure at home is fairly well controlled even though it is slightly elevated in the office.   4.  Neuropathy: - Tingling numbness in the feet, right more than left is stable. - Continue gabapentin 600 mg at bedtime. - She reported pain in the right leg mostly in the lateral part for the last couple of months.  She describes it as achy and worse in the morning and gets better as the day progresses.   5.  Abnormal mammogram: - Abnormal mammogram on 05/15/2020 followed by biopsy of the right breast mass at 9 o'clock position.  Pseudo angiomatous stromal hyperplasia with no evidence of malignancy. - Continue yearly mammograms.   Orders placed this  encounter:  No orders of the defined types were placed in this encounter.    [name], MD [facility] [phone]   I, Thana Ates, am acting as a scribe for Dr. [name].  I, [name] MD, have reviewed the above documentation for accuracy and completeness, and I agree with the above.

## 2020-09-20 ENCOUNTER — Other Ambulatory Visit: Payer: Self-pay

## 2020-09-20 ENCOUNTER — Inpatient Hospital Stay (HOSPITAL_BASED_OUTPATIENT_CLINIC_OR_DEPARTMENT_OTHER): Payer: Medicaid Other | Admitting: Hematology

## 2020-09-20 ENCOUNTER — Inpatient Hospital Stay (HOSPITAL_COMMUNITY): Payer: Medicaid Other | Attending: Hematology

## 2020-09-20 ENCOUNTER — Encounter (HOSPITAL_COMMUNITY): Payer: Self-pay | Admitting: Hematology

## 2020-09-20 ENCOUNTER — Inpatient Hospital Stay (HOSPITAL_COMMUNITY): Payer: Medicaid Other

## 2020-09-20 VITALS — BP 126/82 | HR 68 | Temp 96.9°F | Resp 18

## 2020-09-20 DIAGNOSIS — G629 Polyneuropathy, unspecified: Secondary | ICD-10-CM | POA: Diagnosis not present

## 2020-09-20 DIAGNOSIS — Z5111 Encounter for antineoplastic chemotherapy: Secondary | ICD-10-CM | POA: Insufficient documentation

## 2020-09-20 DIAGNOSIS — Z90710 Acquired absence of both cervix and uterus: Secondary | ICD-10-CM | POA: Diagnosis not present

## 2020-09-20 DIAGNOSIS — C541 Malignant neoplasm of endometrium: Secondary | ICD-10-CM

## 2020-09-20 DIAGNOSIS — Z9079 Acquired absence of other genital organ(s): Secondary | ICD-10-CM | POA: Diagnosis not present

## 2020-09-20 DIAGNOSIS — Z79899 Other long term (current) drug therapy: Secondary | ICD-10-CM | POA: Insufficient documentation

## 2020-09-20 DIAGNOSIS — C786 Secondary malignant neoplasm of retroperitoneum and peritoneum: Secondary | ICD-10-CM | POA: Insufficient documentation

## 2020-09-20 DIAGNOSIS — Z90722 Acquired absence of ovaries, bilateral: Secondary | ICD-10-CM | POA: Insufficient documentation

## 2020-09-20 DIAGNOSIS — I10 Essential (primary) hypertension: Secondary | ICD-10-CM | POA: Diagnosis not present

## 2020-09-20 DIAGNOSIS — M545 Low back pain, unspecified: Secondary | ICD-10-CM | POA: Diagnosis not present

## 2020-09-20 DIAGNOSIS — Z95828 Presence of other vascular implants and grafts: Secondary | ICD-10-CM

## 2020-09-20 LAB — CBC WITH DIFFERENTIAL/PLATELET
Abs Immature Granulocytes: 0.04 10*3/uL (ref 0.00–0.07)
Basophils Absolute: 0.1 10*3/uL (ref 0.0–0.1)
Basophils Relative: 1 %
Eosinophils Absolute: 0.1 10*3/uL (ref 0.0–0.5)
Eosinophils Relative: 1 %
HCT: 35.8 % — ABNORMAL LOW (ref 36.0–46.0)
Hemoglobin: 11.9 g/dL — ABNORMAL LOW (ref 12.0–15.0)
Immature Granulocytes: 0 %
Lymphocytes Relative: 34 %
Lymphs Abs: 3.3 10*3/uL (ref 0.7–4.0)
MCH: 29.3 pg (ref 26.0–34.0)
MCHC: 33.2 g/dL (ref 30.0–36.0)
MCV: 88.2 fL (ref 80.0–100.0)
Monocytes Absolute: 0.8 10*3/uL (ref 0.1–1.0)
Monocytes Relative: 8 %
Neutro Abs: 5.3 10*3/uL (ref 1.7–7.7)
Neutrophils Relative %: 56 %
Platelets: 310 10*3/uL (ref 150–400)
RBC: 4.06 MIL/uL (ref 3.87–5.11)
RDW: 15.7 % — ABNORMAL HIGH (ref 11.5–15.5)
WBC: 9.7 10*3/uL (ref 4.0–10.5)
nRBC: 0 % (ref 0.0–0.2)

## 2020-09-20 LAB — COMPREHENSIVE METABOLIC PANEL WITH GFR
ALT: 27 U/L (ref 0–44)
AST: 21 U/L (ref 15–41)
Albumin: 3.9 g/dL (ref 3.5–5.0)
Alkaline Phosphatase: 104 U/L (ref 38–126)
Anion gap: 5 (ref 5–15)
BUN: 24 mg/dL — ABNORMAL HIGH (ref 6–20)
CO2: 26 mmol/L (ref 22–32)
Calcium: 8.8 mg/dL — ABNORMAL LOW (ref 8.9–10.3)
Chloride: 107 mmol/L (ref 98–111)
Creatinine, Ser: 0.71 mg/dL (ref 0.44–1.00)
GFR, Estimated: >60 mL/min
Glucose, Bld: 109 mg/dL — ABNORMAL HIGH (ref 70–99)
Potassium: 3.6 mmol/L (ref 3.5–5.1)
Sodium: 138 mmol/L (ref 135–145)
Total Bilirubin: 0.4 mg/dL (ref 0.3–1.2)
Total Protein: 7 g/dL (ref 6.5–8.1)

## 2020-09-20 LAB — MAGNESIUM: Magnesium: 1.6 mg/dL — ABNORMAL LOW (ref 1.7–2.4)

## 2020-09-20 MED ORDER — FAMOTIDINE 20 MG IN NS 100 ML IVPB
20.0000 mg | Freq: Once | INTRAVENOUS | Status: AC
  Administered 2020-09-20: 20 mg via INTRAVENOUS
  Filled 2020-09-20: qty 20

## 2020-09-20 MED ORDER — HEPARIN SOD (PORK) LOCK FLUSH 100 UNIT/ML IV SOLN
500.0000 [IU] | Freq: Once | INTRAVENOUS | Status: AC | PRN
  Administered 2020-09-20: 500 [IU]

## 2020-09-20 MED ORDER — PALONOSETRON HCL INJECTION 0.25 MG/5ML
0.2500 mg | Freq: Once | INTRAVENOUS | Status: AC
  Administered 2020-09-20: 0.25 mg via INTRAVENOUS
  Filled 2020-09-20: qty 5

## 2020-09-20 MED ORDER — DIPHENHYDRAMINE HCL 50 MG/ML IJ SOLN
25.0000 mg | Freq: Once | INTRAMUSCULAR | Status: AC
  Administered 2020-09-20: 25 mg via INTRAVENOUS
  Filled 2020-09-20: qty 1

## 2020-09-20 MED ORDER — SODIUM CHLORIDE 0.9% FLUSH
10.0000 mL | INTRAVENOUS | Status: DC | PRN
  Administered 2020-09-20: 10 mL

## 2020-09-20 MED ORDER — SODIUM CHLORIDE 0.9 % IV SOLN: Freq: Once | INTRAVENOUS | Status: AC

## 2020-09-20 MED ORDER — SODIUM CHLORIDE 0.9 % IV SOLN
10.0000 mg | Freq: Once | INTRAVENOUS | Status: AC
  Administered 2020-09-20: 10 mg via INTRAVENOUS
  Filled 2020-09-20: qty 10

## 2020-09-20 MED ORDER — SODIUM CHLORIDE 0.9 % IV SOLN
140.0000 mg/m2 | Freq: Once | INTRAVENOUS | Status: AC
  Administered 2020-09-20: 306 mg via INTRAVENOUS
  Filled 2020-09-20: qty 51

## 2020-09-20 NOTE — Progress Notes (Signed)
Patient has been examined, vital signs and labs have been reviewed by Dr. Katragadda. ANC, Creatinine, LFTs, hemoglobin, and platelets are within treatment parameters per Dr. Katragadda. Patient is okay to proceed with treatment per M.D.   

## 2020-09-20 NOTE — Progress Notes (Signed)
Patient presents today for Taxol infusion per providers order.  Labs and vital signs within parameters for treatment.  Patient has no new complaints at this time.  Taxol given today per MD orders.  Stable during infusion without adverse affects.  Vital signs stable.  No complaints at this time.  Discharge from clinic ambulatory in stable condition.  Alert and oriented X 3.  Follow up with [facility] as scheduled.

## 2020-09-20 NOTE — Patient Instructions (Signed)
[facility]  Discharge Instructions: Thank you for choosing [facility] to provide your oncology and hematology care.  If you have a lab appointment with the [facility], please come in thru the Main Entrance and check in at the main information desk.  Wear comfortable clothing and clothing appropriate for easy access to any Portacath or PICC line.   We strive to give you quality time with your provider. You may need to reschedule your appointment if you arrive late (15 or more minutes).  Arriving late affects you and other patients whose appointments are after yours.  Also, if you miss three or more appointments without notifying the office, you may be dismissed from the clinic at the provider's discretion.      For prescription refill requests, have your pharmacy contact our office and allow 72 hours for refills to be completed.    Today you received the following chemotherapy and/or immunotherapy agents Taxol      To help prevent nausea and vomiting after your treatment, we encourage you to take your nausea medication as directed.  BELOW ARE SYMPTOMS THAT SHOULD BE REPORTED IMMEDIATELY: *FEVER GREATER THAN 100.4 F (38 C) OR HIGHER *CHILLS OR SWEATING *NAUSEA AND VOMITING THAT IS NOT CONTROLLED WITH YOUR NAUSEA MEDICATION *UNUSUAL SHORTNESS OF BREATH *UNUSUAL BRUISING OR BLEEDING *URINARY PROBLEMS (pain or burning when urinating, or frequent urination) *BOWEL PROBLEMS (unusual diarrhea, constipation, pain near the anus) TENDERNESS IN MOUTH AND THROAT WITH OR WITHOUT PRESENCE OF ULCERS (sore throat, sores in mouth, or a toothache) UNUSUAL RASH, SWELLING OR PAIN  UNUSUAL VAGINAL DISCHARGE OR ITCHING   Items with * indicate a potential emergency and should be followed up as soon as possible or go to the Emergency Department if any problems should occur.  Please show the CHEMOTHERAPY ALERT CARD or IMMUNOTHERAPY ALERT CARD at check-in to the Emergency Department  and triage nurse.  Should you have questions after your visit or need to cancel or reschedule your appointment, please contact [facility] [phone]  and follow the prompts.  Office hours are 8:00 a.m. to 4:30 p.m. Monday - Friday. Please note that voicemails left after 4:00 p.m. may not be returned until the following business day.  We are closed weekends and major holidays. You have access to a nurse at all times for urgent questions. Please call the main number to the clinic [phone] and follow the prompts.  For any non-urgent questions, you may also contact your provider using MyChart. We now offer e-Visits for anyone 65 and older to request care online for non-urgent symptoms. For details visit [email or web address].   Also download the MyChart app! Go to the app store, search "MyChart", open the app, select [facility], and log in with your MyChart username and password.  Due to Covid, a mask is required upon entering the hospital/clinic. If you do not have a mask, one will be given to you upon arrival. For doctor visits, patients may have 1 support person aged 41 or older with them. For treatment visits, patients cannot have anyone with them due to current Covid guidelines and our immunocompromised population.

## 2020-09-20 NOTE — Patient Instructions (Addendum)
[facility] at [facility] Discharge Instructions  You were seen today by Dr. Katragadda. He went over your recent results and scans, and you received your treatment. Dr. Katragadda will see you back in 3 weeks for labs and follow up.   Thank you for choosing [facility] at [facility] to provide your oncology and hematology care.  To afford each patient quality time with our provider, please arrive at least 15 minutes before your scheduled appointment time.   If you have a lab appointment with the Cancer Center please come in thru the Main Entrance and check in at the main information desk  You need to re-schedule your appointment should you arrive 10 or more minutes late.  We strive to give you quality time with our providers, and arriving late affects you and other patients whose appointments are after yours.  Also, if you no show three or more times for appointments you may be dismissed from the clinic at the providers discretion.     Again, thank you for choosing [facility].  Our hope is that these requests will decrease the amount of time that you wait before being seen by our physicians.       _____________________________________________________________  Should you have questions after your visit to [facility], please contact our office at [phone] between the hours of 8:00 a.m. and 4:30 p.m.  Voicemails left after 4:00 p.m. will not be returned until the following business day.  For prescription refill requests, have your pharmacy contact our office and allow 72 hours.    Cancer Center Support Programs:   > Cancer Support Group  2nd Tuesday of the month 1pm-2pm, Journey Room   

## 2020-09-21 LAB — CA 125: Cancer Antigen (CA) 125: 6.7 U/mL (ref 0.0–38.1)

## 2020-09-23 ENCOUNTER — Encounter (HOSPITAL_COMMUNITY): Payer: Self-pay | Admitting: Hematology

## 2020-09-27 ENCOUNTER — Other Ambulatory Visit: Payer: Self-pay | Admitting: Family Medicine

## 2020-09-27 DIAGNOSIS — E785 Hyperlipidemia, unspecified: Secondary | ICD-10-CM

## 2020-10-10 ENCOUNTER — Other Ambulatory Visit (HOSPITAL_COMMUNITY): Payer: Self-pay | Admitting: *Deleted

## 2020-10-10 DIAGNOSIS — C541 Malignant neoplasm of endometrium: Secondary | ICD-10-CM

## 2020-10-10 NOTE — Progress Notes (Signed)
[place], [place] [address]   CLINIC:  Medical Oncology/Hematology  PCP:  Perlie Mayo, NP [address] / [place] [address] [phone]   REASON FOR VISIT:  Follow-up for recurrent endometrial cancer  PRIOR THERAPY: TAH & BSO, bilateral pelvic and para-aortic lymph nodes dissections on 04/13/2018  NGS Results: not done  CURRENT THERAPY:  Paclitaxel & Aloxi every 3 weeks  BRIEF ONCOLOGIC HISTORY:  Oncology History  Endometrial cancer ([facility])  03/31/2018 Initial Diagnosis   Endometrial cancer ([facility])   12/29/2018 -  Chemotherapy    Patient is on Treatment Plan: UTERINE CARBOPLATIN AUC 6 / PACLITAXEL Q21D       06/06/2020 Miscellaneous   Summary of oncologic history from Dr. Delton Coombes   1.  Recurrent endometrial carcinosarcoma: -TAH, BSO, bilateral pelvic and para-aortic lymph node resections on 04/13/2018. -Pathology showed carcinosarcoma (malignant mixed mullerian tumor) is arising in an endometrial type polyp with no myometrial invasion identified.  High-grade.  0/39 lymph nodes positive.  PT1APN0, FIGO stage Ia.  MMR normal.  MSI-stable. -CTAP on 12/09/2018 for abdominal pain showed extensive peritoneal carcinomatosis and large soft tissue mass in the pelvis. -Biopsy of the omental mass on 12/28/2018 shows poorly differentiated carcinoma consistent with her prior malignancy. -Carboplatin and paclitaxel started on 12/29/2018. -CT scan on 05/02/2019 showed peritoneal implants in the low central small bowel mesentery and left pelvic sidewall have decreased in size measuring 1.5 x 1.9 cm and 1.7 x 2.2 cm.  Soft tissue nodule in the left lateral omentum measures 1 x 1.7 cm with no evidence of metastatic disease. -Continuation of chemotherapy until complete response was recommended. -CT AP on 07/04/2019 showed continued positive response to therapy with scattered peritoneal metastasis in the pelvis and left omentum decreased in size.  No new  metastatic disease. -CTAP on 10/24/2019 after 14 cycles showed substantial reduction in size of soft tissue nodules in the pelvis.  1.4 x 0.9 x 1 cm soft tissue density in the inferior serosal margin of the sigmoid colon, previously 1.7 x 1.1 x 1.7 cm.  2 small soft tissue nodules in the mesenteric adipose tissue above the urinary bladder measures 0.8 cm. -She has developed serious allergic reaction during cycle 14 with carboplatin. -I have discussed with Dr. Denman George.  Other options include adding ifosfamide to Taxol or Doxil and combination of lenvatinib with pembrolizumab.  In the lenvatinib pembrolizumab trial carcinosarcomas were not included. -Single agent paclitaxel started on 11/09/2019, dose reduced by 20% on 01/18/2020. -CTAP on 01/10/2020 with stable small peritoneal soft tissue nodules in the pelvis.  Resolution of soft tissue nodularity in the left abdominal omental fat with no new or progressive disease.     06/20/2020 Imaging   1. Stable examination status post hysterectomy without suspicious enhancing soft tissue nodularity at the vaginal cuff and no significant change in the multiple small soft tissue nodules in the pelvis likely representing treated tumor. No new or progressive findings. 2. Cholelithiasis including a large gallstone in the neck of the gallbladder but without CT evidence of acute cholecystitis. 3. Small hiatal hernia. 4. Aortic atherosclerosis.       CANCER STAGING: Cancer Staging No matching staging information was found for the patient.  INTERVAL HISTORY:  Jessica [name], a 60 y.o. female, returns for routine follow-up and consideration for next cycle of chemotherapy. Jessica [name] was last seen on 09/20/2020.  Due for cycle #30 of Taxol today.   Overall, she tells me she  has been feeling pretty well. She is working 2 days a week and reports constant fatigue; she admits she has not been active and spends most of her time on her couch. She reports increased nausea  as well as diarrhea for a few days following treatment, and she denies constipation and vomiting. She also reports muscle spasms in her back upon quick movement of deep inhalation.   Overall, she feels ready for next cycle of chemo today.   REVIEW OF SYSTEMS:  Review of Systems  Constitutional:  Positive for fatigue (40%). Negative for appetite change.  Gastrointestinal:  Positive for diarrhea and nausea. Negative for constipation and vomiting.  Musculoskeletal:  Positive for back pain (spams) and myalgias (3/10 R leg).  Neurological:  Positive for dizziness, headaches and numbness (tingling feet).  Psychiatric/Behavioral:  Positive for depression and sleep disturbance. The patient is nervous/anxious.   All other systems reviewed and are negative.  PAST MEDICAL/SURGICAL HISTORY:  Past Medical History:  Diagnosis Date   Cancer ([facility])    Phreesia 09/05/2019   Elevated blood pressure reading    Endometrial cancer ([facility])    Hyperlipidemia    Hypertension    PMB (postmenopausal bleeding)    Port-A-Cath in place 12/24/2018   Past Surgical History:  Procedure Laterality Date   ABDOMINAL HYSTERECTOMY N/A    Phreesia 09/05/2019   IR IMAGING GUIDED PORT INSERTION  12/28/2018   IR RADIOLOGIST EVAL & MGMT  06/02/2018   IR RADIOLOGIST EVAL & MGMT  06/16/2018   NECK SURGERY  2000   spinal surgery , titanium rod in place    ROBOTIC ASSISTED TOTAL HYSTERECTOMY WITH BILATERAL SALPINGO OOPHERECTOMY N/A 04/13/2018   Procedure: XI ROBOTIC [place];  Surgeon: Everitt Amber, MD;  Location: [facility];  Service: Gynecology;  Laterality: N/A;   ROBOTIC PELVIC AND PARA-AORTIC LYMPH NODE DISSECTION N/A 04/13/2018   Procedure: XI ROBOTIC PELVIC LYMPHADECTOMY AND PARA-AORTIC LYMPH NODE DISSECTION;  Surgeon: Everitt Amber, MD;  Location: [facility];  Service: Gynecology;  Laterality: N/A;    SOCIAL HISTORY:  Social History   Socioeconomic History   Marital status:  Widowed    Spouse name: Not on file   Number of children: 3   Years of education: Not on file   Highest education level: Not on file  Occupational History   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  Tobacco Use   Smoking status: Never   Smokeless tobacco: Never  Vaping Use   Vaping Use: Never used  Substance and Sexual Activity   Alcohol use: Yes    Alcohol/week: 4.0 standard drinks    Types: 4 Glasses of wine per week    Comment: weekends   Drug use: Never   Sexual activity: Not Currently  Other Topics Concern   Not on file  Social History Narrative   Lives with daughter and granddaughter here in [place], moved 3 years ago      Cats: Zoe-14 years old, Eduard Clos, and Storm      Enjoys: walking when she can tolerate- spending time with granddaughter (her life)      Diet: eats all food groups   Caffeine: 1-2 cups at times, but does not tolerate it all the time   Water: 160 oz daily      Wears seat belt    Does not use phone while driving    Smoke detectors at home   Fire extinguisher-no   No weapons   Social Determinants  of Health   Financial Resource Strain: Not on file  Food Insecurity: Not on file  Transportation Needs: Not on file  Physical Activity: Inactive   Days of Exercise per Week: 0 days   Minutes of Exercise per Session: 0 min  Stress: Not on file  Social Connections: Not on file  Intimate Partner Violence: Not on file    FAMILY HISTORY:  Family History  Problem Relation Age of Onset   Hypertension Father    Heart disease Father    Cancer Father        lung   Hypertension Mother    Cancer Sister        ovarian cancer    CURRENT MEDICATIONS:  Current Outpatient Medications  Medication Sig Dispense Refill   amLODipine (NORVASC) 5 MG tablet Take 1 tablet (5 mg total) by mouth daily. 90 tablet 1   atorvastatin (LIPITOR) 20 MG tablet Take 1 tablet by mouth once daily 90 tablet 0   EPINEPHrine 0.3 mg/0.3 mL IJ SOAJ injection Inject 0.3 mg  into the muscle as needed for anaphylaxis. 1 each 0   gabapentin (NEURONTIN) 300 MG capsule Take 1 capsule (300 mg total) by mouth 3 (three) times daily. 90 capsule 6   lidocaine-prilocaine (EMLA) cream Apply to port site prior to use 30 g 0   oxyCODONE-acetaminophen (PERCOCET) 5-325 MG tablet Take 1 tablet by mouth every 12 (twelve) hours as needed (pain). 60 tablet 0   PACLITAXEL IV Inject into the vein every 21 ( twenty-one) days.     prochlorperazine (COMPAZINE) 10 MG tablet Take 1 tablet (10 mg total) by mouth every 6 (six) hours as needed (Nausea or vomiting). 60 tablet 3   No current facility-administered medications for this visit.   Facility-Administered Medications Ordered in Other Visits  Medication Dose Route Frequency Provider Last Rate Last Admin   diphenhydrAMINE (BENADRYL) 50 MG/ML injection            palonosetron (ALOXI) 0.25 MG/5ML injection             ALLERGIES:  Allergies  Allergen Reactions   Carboplatin Other (See Comments)    "body on fire and abdominal pain"   Nickel Rash    itchy    PHYSICAL EXAM:  Performance status (ECOG): 1 - Symptomatic but completely ambulatory  There were no vitals filed for this visit. Wt Readings from Last 3 Encounters:  09/20/20 238 lb 1.6 oz (108 kg)  08/30/20 237 lb (107.5 kg)  08/09/20 234 lb 3.2 oz (106.2 kg)   Physical Exam Vitals reviewed.  Constitutional:      Appearance: Normal appearance.  Cardiovascular:     Rate and Rhythm: Normal rate and regular rhythm.     Pulses: Normal pulses.     Heart sounds: Normal heart sounds.  Pulmonary:     Effort: Pulmonary effort is normal.     Breath sounds: Normal breath sounds.  Abdominal:     Palpations: Abdomen is soft. There is no hepatomegaly, splenomegaly or mass.     Tenderness: There is no abdominal tenderness.  Musculoskeletal:     Right lower leg: No edema.     Left lower leg: No edema.  Neurological:     General: No focal deficit present.     Mental Status:  She is alert and oriented to person, place, and time.  Psychiatric:        Mood and Affect: Mood normal.        Behavior: Behavior normal.  LABORATORY DATA:  I have reviewed the labs as listed.  CBC Latest Ref Rng & Units 09/20/2020 08/30/2020 08/09/2020  WBC 4.0 - 10.5 K/uL 9.7 8.4 8.0  Hemoglobin 12.0 - 15.0 g/dL 11.9(L) 12.0 12.4  Hematocrit 36.0 - 46.0 % 35.8(L) 37.1 38.2  Platelets 150 - 400 K/uL 310 305 301   CMP Latest Ref Rng & Units 09/20/2020 08/30/2020 08/09/2020  Glucose 70 - 99 mg/dL 109(H) 129(H) 126(H)  BUN 6 - 20 mg/dL 24(H) 19 21(H)  Creatinine 0.44 - 1.00 mg/dL 0.71 0.64 0.73  Sodium 135 - 145 mmol/L 138 136 137  Potassium 3.5 - 5.1 mmol/L 3.6 3.8 3.6  Chloride 98 - 111 mmol/L 107 106 102  CO2 22 - 32 mmol/L _0 Calcium 8.9 - 10.3 mg/dL 8.8(L) 8.9 8.9  Total Protein 6.5 - 8.1 g/dL 7.0 7.0 7.1  Total Bilirubin 0.3 - 1.2 mg/dL 0.4 0.5 0.7  Alkaline Phos 38 - 126 U/L 104 107 109  AST 15 - 41 U/L _1 ALT 0 - 44 U/L _2 DIAGNOSTIC IMAGING:  I have independently reviewed the scans and discussed with the patient. CT ABDOMEN PELVIS W CONTRAST  Result Date: 09/17/2020 CLINICAL DATA:  History of endometrial cancer, status post hysterectomy. Restaging. EXAM: CT ABDOMEN AND PELVIS WITH CONTRAST TECHNIQUE: Multidetector CT imaging of the abdomen and pelvis was performed using the standard protocol following bolus administration of intravenous contrast. CONTRAST:  43m OMNIPAQUE IOHEXOL 350 MG/ML SOLN COMPARISON:  Multiple priors including most recent CT June 20, 2020. FINDINGS: Lower chest: No acute abnormality. Hepatobiliary: No suspicious hepatic lesion. Cholelithiasis measuring up to 1.4 cm in the neck with similar distension of the gallbladder without adjacent inflammation. No biliary ductal dilation. Pancreas: Within normal limits. Spleen: Within normal limits. Adrenals/Urinary Tract: Adrenal glands are unremarkable. Kidneys are normal, without renal calculi,  solid enhancing lesion, or hydronephrosis. Bladder is unremarkable for degree of distension. Stomach/Bowel: Moderate hiatal hernia otherwise the stomach is unremarkable. Radiopaque enteric contrast traverses the rectum. No pathologic dilation of small or large bowel. No evidence of acute bowel inflammation. Normal appendix. Vascular/Lymphatic: Aortic atherosclerosis without aneurysmal dilation. No gastrohepatic or hepatoduodenal ligament lymphadenopathy. No retroperitoneal or mesenteric lymphadenopathy. No pelvic sidewall lymphadenopathy. No groin lymphadenopathy. Reproductive: Uterus is surgically absent. There is no suspicious enhancing soft tissue nodularity along the vaginal cuff. No suspicious adnexal masses. Other: No significant interval change in the multiple 1 cm or smaller soft tissue nodules throughout the pelvis, with the previously index largest nodule along the left side of the mid sigmoid colon measuring 10 mm on image 70/2, unchanged. No abdominopelvic ascites. No omental caking or new areas of suspicious nodularity. Musculoskeletal: Multilevel degenerative changes spine. No aggressive lytic or blastic lesion of bone. IMPRESSION: 1. Stable examination status post hysterectomy without evidence local recurrence and no significant interval change in the multiple soft tissue nodules throughout the pelvis, likely representing treated tumor. 2. No new or progressive findings in the abdomen or pelvis 3. Cholelithiasis without evidence of acute cholecystitis. 4. Moderate hiatal hernia. 5.  Aortic Atherosclerosis (ICD10-I70.0). Electronically Signed   By: JDahlia BailiffM.D.   On: 09/17/2020 14:00     ASSESSMENT:  1.  Recurrent endometrial carcinosarcoma: -TAH, BSO, bilateral pelvic and para-aortic lymph node resections on 04/13/2018. -Pathology showed carcinosarcoma (malignant mixed mullerian tumor) is arising in an endometrial type polyp with no myometrial invasion identified.  High-grade.  0/39 lymph  nodes positive.  PT1APN0, FIGO  stage Ia.  MMR normal.  MSI-stable. -CTAP on 12/09/2018 for abdominal pain showed extensive peritoneal carcinomatosis and large soft tissue mass in the pelvis. -Biopsy of the omental mass on 12/28/2018 shows poorly differentiated carcinoma consistent with her prior malignancy. -Carboplatin and paclitaxel started on 12/29/2018. -CT scan on 05/02/2019 showed peritoneal implants in the low central small bowel mesentery and left pelvic sidewall have decreased in size measuring 1.5 x 1.9 cm and 1.7 x 2.2 cm.  Soft tissue nodule in the left lateral omentum measures 1 x 1.7 cm with no evidence of metastatic disease. -Continuation of chemotherapy until complete response was recommended. -CT AP on 07/04/2019 showed continued positive response to therapy with scattered peritoneal metastasis in the pelvis and left omentum decreased in size.  No new metastatic disease. -CTAP on 10/24/2019 after 14 cycles showed substantial reduction in size of soft tissue nodules in the pelvis.  1.4 x 0.9 x 1 cm soft tissue density in the inferior serosal margin of the sigmoid colon, previously 1.7 x 1.1 x 1.7 cm.  2 small soft tissue nodules in the mesenteric adipose tissue above the urinary bladder measures 0.8 cm. -She has developed serious allergic reaction during cycle 14 with carboplatin. -I have discussed with Dr. Denman George.  Other options include adding ifosfamide to Taxol or Doxil and combination of lenvatinib with pembrolizumab.  In the lenvatinib pembrolizumab trial carcinosarcomas were not included. -Single agent paclitaxel started on 11/09/2019, dose reduced by 20% on 01/18/2020. -CTAP on 01/10/2020 with stable small peritoneal soft tissue nodules in the pelvis.  Resolution of soft tissue nodularity in the left abdominal omental fat with no new or progressive disease.   2.  Breast abnormality: -Mammogram on 05/02/2019 shows BI-RADS Category 0.  She reportedly took Covid shot 1 to 2 weeks prior to  the mammogram.  Further scans rescheduled on 06/21/2019.   3.  Peripheral neuropathy: -She reported numbness and occasional pins-and-needles sensation in the bilateral toes, right more than left.  This has started about a week ago.   PLAN:  1.  Recurrent endometrial carcinosarcoma: - CTAP from 09/17/2020 showed multiple subcentimeter pelvic soft tissue nodules which are stable.  CA125 is normal. - She has occasional right lateral leg pain which is achy, likely from paclitaxel. - She is continuing to work couple of days a week, 10 hours/week at [employer]. - I have communicated with Dr. Denman George to see if anything else can be offered than continuing paclitaxel. - I have reviewed labs from 10/11/2020.  CBC was grossly normal.  LFTs were normal.  CA125 is 5.9. - She will proceed with next cycle of paclitaxel, dose reduced to 140 mg per metered square.  RTC 3 weeks for follow-up.   2.  Low back pain/right-sided abdominal pain: - She has on and off bilateral hip pains. - Continue oxycodone 5/325 3-4 times per week.   3.  Hypertension: - Continue Norvasc 5 mg daily.  Blood pressure at home is fairly well controlled.   4.  Neuropathy: - She has tingling numbness in the feet, right more than left which is stable. - Continue gabapentin 600 mg at bedtime.  She also has occasional achiness in the legs.  Likely from paclitaxel.   5.  Abnormal mammogram: - Abnormal mammogram on 05/15/2020, status postbiopsy of the right breast 9 o'clock position, benign lesion. - Continue yearly mammograms.   Orders placed this encounter:  No orders of the defined types were placed in this encounter.    [name], MD  [facility] [phone]   I, Thana Ates, am acting as a scribe for Dr. [name].  I, [name] MD, have reviewed the above documentation for accuracy and completeness, and I agree with the above.

## 2020-10-11 ENCOUNTER — Inpatient Hospital Stay (HOSPITAL_BASED_OUTPATIENT_CLINIC_OR_DEPARTMENT_OTHER): Payer: Medicaid Other | Admitting: Hematology

## 2020-10-11 ENCOUNTER — Inpatient Hospital Stay (HOSPITAL_COMMUNITY): Payer: Medicaid Other

## 2020-10-11 VITALS — BP 127/76 | HR 83 | Temp 97.6°F | Resp 18

## 2020-10-11 DIAGNOSIS — C541 Malignant neoplasm of endometrium: Secondary | ICD-10-CM

## 2020-10-11 DIAGNOSIS — Z5111 Encounter for antineoplastic chemotherapy: Secondary | ICD-10-CM | POA: Diagnosis not present

## 2020-10-11 DIAGNOSIS — Z95828 Presence of other vascular implants and grafts: Secondary | ICD-10-CM

## 2020-10-11 LAB — CBC WITH DIFFERENTIAL/PLATELET
Abs Immature Granulocytes: 0.03 10*3/uL (ref 0.00–0.07)
Basophils Absolute: 0.1 10*3/uL (ref 0.0–0.1)
Basophils Relative: 1 %
Eosinophils Absolute: 0.1 10*3/uL (ref 0.0–0.5)
Eosinophils Relative: 2 %
HCT: 37 % (ref 36.0–46.0)
Hemoglobin: 12.1 g/dL (ref 12.0–15.0)
Immature Granulocytes: 0 %
Lymphocytes Relative: 33 %
Lymphs Abs: 2.5 10*3/uL (ref 0.7–4.0)
MCH: 29.2 pg (ref 26.0–34.0)
MCHC: 32.7 g/dL (ref 30.0–36.0)
MCV: 89.2 fL (ref 80.0–100.0)
Monocytes Absolute: 0.6 10*3/uL (ref 0.1–1.0)
Monocytes Relative: 8 %
Neutro Abs: 4.3 10*3/uL (ref 1.7–7.7)
Neutrophils Relative %: 56 %
Platelets: 327 10*3/uL (ref 150–400)
RBC: 4.15 MIL/uL (ref 3.87–5.11)
RDW: 16.1 % — ABNORMAL HIGH (ref 11.5–15.5)
WBC: 7.5 10*3/uL (ref 4.0–10.5)
nRBC: 0 % (ref 0.0–0.2)

## 2020-10-11 LAB — COMPREHENSIVE METABOLIC PANEL
ALT: 25 U/L (ref 0–44)
AST: 20 U/L (ref 15–41)
Albumin: 4 g/dL (ref 3.5–5.0)
Alkaline Phosphatase: 91 U/L (ref 38–126)
Anion gap: 7 (ref 5–15)
BUN: 15 mg/dL (ref 6–20)
CO2: 25 mmol/L (ref 22–32)
Calcium: 9.2 mg/dL (ref 8.9–10.3)
Chloride: 105 mmol/L (ref 98–111)
Creatinine, Ser: 0.61 mg/dL (ref 0.44–1.00)
GFR, Estimated: >60 mL/min (ref >60)
Glucose, Bld: 120 mg/dL — ABNORMAL HIGH (ref 70–99)
Potassium: 3.8 mmol/L (ref 3.5–5.1)
Sodium: 137 mmol/L (ref 135–145)
Total Bilirubin: 0.3 mg/dL (ref 0.3–1.2)
Total Protein: 7.2 g/dL (ref 6.5–8.1)

## 2020-10-11 MED ORDER — HEPARIN SOD (PORK) LOCK FLUSH 100 UNIT/ML IV SOLN
500.0000 [IU] | Freq: Once | INTRAVENOUS | Status: AC | PRN
  Administered 2020-10-11: 500 [IU]

## 2020-10-11 MED ORDER — FAMOTIDINE 20 MG IN NS 100 ML IVPB
20.0000 mg | Freq: Once | INTRAVENOUS | Status: AC
  Administered 2020-10-11: 20 mg via INTRAVENOUS
  Filled 2020-10-11: qty 20

## 2020-10-11 MED ORDER — SODIUM CHLORIDE 0.9 % IV SOLN: Freq: Once | INTRAVENOUS | Status: AC

## 2020-10-11 MED ORDER — PALONOSETRON HCL INJECTION 0.25 MG/5ML
0.2500 mg | Freq: Once | INTRAVENOUS | Status: AC
Start: 2020-10-11 — End: 2020-10-11
  Administered 2020-10-11: 0.25 mg via INTRAVENOUS
  Filled 2020-10-11: qty 5

## 2020-10-11 MED ORDER — SODIUM CHLORIDE 0.9% FLUSH
10.0000 mL | INTRAVENOUS | Status: DC | PRN
  Administered 2020-10-11: 10 mL

## 2020-10-11 MED ORDER — SODIUM CHLORIDE 0.9 % IV SOLN
10.0000 mg | Freq: Once | INTRAVENOUS | Status: AC
  Administered 2020-10-11: 10 mg via INTRAVENOUS
  Filled 2020-10-11: qty 10

## 2020-10-11 MED ORDER — DIPHENHYDRAMINE HCL 50 MG/ML IJ SOLN
25.0000 mg | Freq: Once | INTRAMUSCULAR | Status: AC
  Administered 2020-10-11: 25 mg via INTRAVENOUS
  Filled 2020-10-11: qty 1

## 2020-10-11 MED ORDER — SODIUM CHLORIDE 0.9 % IV SOLN
140.0000 mg/m2 | Freq: Once | INTRAVENOUS | Status: AC
  Administered 2020-10-11: 306 mg via INTRAVENOUS
  Filled 2020-10-11: qty 51

## 2020-10-11 NOTE — Progress Notes (Signed)
Patient has been examined, vital signs and labs have been reviewed by Dr. Katragadda. ANC, Creatinine, LFTs, hemoglobin, and platelets are within treatment parameters per Dr. Katragadda. Patient is okay to proceed with treatment per M.D.   

## 2020-10-11 NOTE — Progress Notes (Signed)
Pt presents today for Taxol per provider's order. Vital signs and labs within normal parameters for treatment. Okay to proceed with treatment today per Dr.Katragadda.  Taxol given today per MD orders. Tolerated infusion without adverse affects. Vital signs stable. No complaints at this time. Discharged from clinic ambulatory in stable condition. Alert and oriented x 3. F/U with [facility] as scheduled.

## 2020-10-11 NOTE — Patient Instructions (Addendum)
[facility] at [facility] Discharge Instructions  You were seen today by Dr. Katragadda. He went over your recent results, and you received your treatment. Dr. Katragadda will see you back in 3 weeks for labs and follow up.   Thank you for choosing [facility] at [facility] to provide your oncology and hematology care.  To afford each patient quality time with our provider, please arrive at least 15 minutes before your scheduled appointment time.   If you have a lab appointment with the Cancer Center please come in thru the Main Entrance and check in at the main information desk  You need to re-schedule your appointment should you arrive 10 or more minutes late.  We strive to give you quality time with our providers, and arriving late affects you and other patients whose appointments are after yours.  Also, if you no show three or more times for appointments you may be dismissed from the clinic at the providers discretion.     Again, thank you for choosing [facility].  Our hope is that these requests will decrease the amount of time that you wait before being seen by our physicians.       _____________________________________________________________  Should you have questions after your visit to [facility], please contact our office at [phone] between the hours of 8:00 a.m. and 4:30 p.m.  Voicemails left after 4:00 p.m. will not be returned until the following business day.  For prescription refill requests, have your pharmacy contact our office and allow 72 hours.    Cancer Center Support Programs:   > Cancer Support Group  2nd Tuesday of the month 1pm-2pm, Journey Room   

## 2020-10-11 NOTE — Patient Instructions (Signed)
[place] [place]  Discharge Instructions: Thank you for choosing [facility] to provide your oncology and hematology care.  If you have a lab appointment with the [facility], please come in thru the Main Entrance and check in at the main information desk.  Wear comfortable clothing and clothing appropriate for easy access to any Portacath or PICC line.   We strive to give you quality time with your provider. You may need to reschedule your appointment if you arrive late (15 or more minutes).  Arriving late affects you and other patients whose appointments are after yours.  Also, if you miss three or more appointments without notifying the office, you may be dismissed from the clinic at the provider's discretion.      For prescription refill requests, have your pharmacy contact our office and allow 72 hours for refills to be completed.    Today you received the following chemotherapy and/or immunotherapy agents Taxol   To help prevent nausea and vomiting after your treatment, we encourage you to take your nausea medication as directed.  BELOW ARE SYMPTOMS THAT SHOULD BE REPORTED IMMEDIATELY: *FEVER GREATER THAN 100.4 F (38 C) OR HIGHER *CHILLS OR SWEATING *NAUSEA AND VOMITING THAT IS NOT CONTROLLED WITH YOUR NAUSEA MEDICATION *UNUSUAL SHORTNESS OF BREATH *UNUSUAL BRUISING OR BLEEDING *URINARY PROBLEMS (pain or burning when urinating, or frequent urination) *BOWEL PROBLEMS (unusual diarrhea, constipation, pain near the anus) TENDERNESS IN MOUTH AND THROAT WITH OR WITHOUT PRESENCE OF ULCERS (sore throat, sores in mouth, or a toothache) UNUSUAL RASH, SWELLING OR PAIN  UNUSUAL VAGINAL DISCHARGE OR ITCHING   Items with * indicate a potential emergency and should be followed up as soon as possible or go to the Emergency Department if any problems should occur.  Please show the CHEMOTHERAPY ALERT CARD or IMMUNOTHERAPY ALERT CARD at check-in to the Emergency Department  and triage nurse.  Should you have questions after your visit or need to cancel or reschedule your appointment, please contact [facility] [phone]  and follow the prompts.  Office hours are 8:00 a.m. to 4:30 p.m. Monday - Friday. Please note that voicemails left after 4:00 p.m. may not be returned until the following business day.  We are closed weekends and major holidays. You have access to a nurse at all times for urgent questions. Please call the main number to the clinic [phone] and follow the prompts.  For any non-urgent questions, you may also contact your provider using MyChart. We now offer e-Visits for anyone 55 and older to request care online for non-urgent symptoms. For details visit [email or web address].   Also download the MyChart app! Go to the app store, search "MyChart", open the app, select [facility], and log in with your MyChart username and password.  Due to Covid, a mask is required upon entering the hospital/clinic. If you do not have a mask, one will be given to you upon arrival. For doctor visits, patients may have 1 support person aged 108 or older with them. For treatment visits, patients cannot have anyone with them due to current Covid guidelines and our immunocompromised population.

## 2020-10-12 ENCOUNTER — Encounter (HOSPITAL_COMMUNITY): Payer: Self-pay | Admitting: Hematology

## 2020-10-12 LAB — CA 125: Cancer Antigen (CA) 125: 5.9 U/mL (ref 0.0–38.1)

## 2020-10-25 ENCOUNTER — Other Ambulatory Visit: Payer: Self-pay

## 2020-10-25 ENCOUNTER — Ambulatory Visit (INDEPENDENT_AMBULATORY_CARE_PROVIDER_SITE_OTHER): Payer: Medicaid Other

## 2020-10-25 ENCOUNTER — Telehealth: Payer: Self-pay

## 2020-10-25 DIAGNOSIS — I10 Essential (primary) hypertension: Secondary | ICD-10-CM

## 2020-10-25 DIAGNOSIS — Z23 Encounter for immunization: Secondary | ICD-10-CM | POA: Diagnosis not present

## 2020-10-25 MED ORDER — AMLODIPINE BESYLATE 5 MG PO TABS: 5.0000 mg | ORAL_TABLET | Freq: Every day | ORAL | 1 refills | Status: DC

## 2020-10-25 NOTE — Telephone Encounter (Signed)
Refills sent

## 2020-10-25 NOTE — Telephone Encounter (Signed)
Patient called for med refill, had to reschedule CPE-Gray out of office.    amLODipine (NORVASC) 5 MG tablet  Pharmacy:  [facility]

## 2020-10-31 NOTE — Progress Notes (Signed)
[place], [place] [address]   CLINIC:  Medical Oncology/Hematology  PCP:  Noreene Larsson, NP [address]  [address] / [place] [address] [phone]   REASON FOR VISIT:  Follow-up for recurrent endometrial cancer  PRIOR THERAPY: TAH & BSO, bilateral pelvic and para-aortic lymph nodes dissections on 04/13/2018  NGS Results: not done  CURRENT THERAPY: Paclitaxel & Aloxi every 3 weeks  BRIEF ONCOLOGIC HISTORY:  Oncology History  Endometrial cancer ([facility])  03/31/2018 Initial Diagnosis   Endometrial cancer ([place])   12/29/2018 -  Chemotherapy    Patient is on Treatment Plan: UTERINE CARBOPLATIN AUC 6 / PACLITAXEL Q21D       06/06/2020 Miscellaneous   Summary of oncologic history from Dr. Delton Coombes   1.  Recurrent endometrial carcinosarcoma: -TAH, BSO, bilateral pelvic and para-aortic lymph node resections on 04/13/2018. -Pathology showed carcinosarcoma (malignant mixed mullerian tumor) is arising in an endometrial type polyp with no myometrial invasion identified.  High-grade.  0/39 lymph nodes positive.  PT1APN0, FIGO stage Ia.  MMR normal.  MSI-stable. -CTAP on 12/09/2018 for abdominal pain showed extensive peritoneal carcinomatosis and large soft tissue mass in the pelvis. -Biopsy of the omental mass on 12/28/2018 shows poorly differentiated carcinoma consistent with her prior malignancy. -Carboplatin and paclitaxel started on 12/29/2018. -CT scan on 05/02/2019 showed peritoneal implants in the low central small bowel mesentery and left pelvic sidewall have decreased in size measuring 1.5 x 1.9 cm and 1.7 x 2.2 cm.  Soft tissue nodule in the left lateral omentum measures 1 x 1.7 cm with no evidence of metastatic disease. -Continuation of chemotherapy until complete response was recommended. -CT AP on 07/04/2019 showed continued positive response to therapy with scattered peritoneal metastasis in the pelvis and left omentum decreased  in size.  No new metastatic disease. -CTAP on 10/24/2019 after 14 cycles showed substantial reduction in size of soft tissue nodules in the pelvis.  1.4 x 0.9 x 1 cm soft tissue density in the inferior serosal margin of the sigmoid colon, previously 1.7 x 1.1 x 1.7 cm.  2 small soft tissue nodules in the mesenteric adipose tissue above the urinary bladder measures 0.8 cm. -She has developed serious allergic reaction during cycle 14 with carboplatin. -I have discussed with Dr. Denman George.  Other options include adding ifosfamide to Taxol or Doxil and combination of lenvatinib with pembrolizumab.  In the lenvatinib pembrolizumab trial carcinosarcomas were not included. -Single agent paclitaxel started on 11/09/2019, dose reduced by 20% on 01/18/2020. -CTAP on 01/10/2020 with stable small peritoneal soft tissue nodules in the pelvis.  Resolution of soft tissue nodularity in the left abdominal omental fat with no new or progressive disease.     06/20/2020 Imaging   1. Stable examination status post hysterectomy without suspicious enhancing soft tissue nodularity at the vaginal cuff and no significant change in the multiple small soft tissue nodules in the pelvis likely representing treated tumor. No new or progressive findings. 2. Cholelithiasis including a large gallstone in the neck of the gallbladder but without CT evidence of acute cholecystitis. 3. Small hiatal hernia. 4. Aortic atherosclerosis.       CANCER STAGING: Cancer Staging No matching staging information was found for the patient.  INTERVAL HISTORY:  [name], a 60 y.o. female, returns for routine follow-up and consideration for next cycle of chemotherapy. Marnette was last seen on 10/11/2020.  Due for cycle #31 of Carboplatin and Taxol today.   Overall,  she tells me she has been feeling pretty well. She reports stable aching pain in her right leg which is not helped by Gabapentin but is somewhat helped by Oxycodone; she also  reports back pain. She has pain in her feet and tingling in her toes which is worst at night which is helped by Gabapentin; she denies any history of injury or surgery to her feet.   Overall, she feels ready for next cycle of chemo today.   REVIEW OF SYSTEMS:  Review of Systems  Constitutional:  Positive for fatigue (75%). Negative for appetite change (75%).  Gastrointestinal:  Positive for diarrhea and nausea.  Musculoskeletal:  Positive for arthralgias (2/10 R leg) and back pain.  Neurological:  Positive for dizziness.  All other systems reviewed and are negative.  PAST MEDICAL/SURGICAL HISTORY:  Past Medical History:  Diagnosis Date   Cancer ([place])    Phreesia 09/05/2019   Elevated blood pressure reading    Endometrial cancer (HCC)    Hyperlipidemia    Hypertension    PMB (postmenopausal bleeding)    Port-A-Cath in place 12/24/2018   Past Surgical History:  Procedure Laterality Date   ABDOMINAL HYSTERECTOMY N/A    Phreesia 09/05/2019   IR IMAGING GUIDED PORT INSERTION  12/28/2018   IR RADIOLOGIST EVAL & MGMT  06/02/2018   IR RADIOLOGIST EVAL & MGMT  06/16/2018   NECK SURGERY  2000   spinal surgery , titanium rod in place    ROBOTIC ASSISTED TOTAL HYSTERECTOMY WITH BILATERAL SALPINGO OOPHERECTOMY N/A 04/13/2018   Procedure: XI ROBOTIC [place];  Surgeon: Everitt Amber, MD;  Location: [facility];  Service: Gynecology;  Laterality: N/A;   ROBOTIC PELVIC AND PARA-AORTIC LYMPH NODE DISSECTION N/A 04/13/2018   Procedure: XI ROBOTIC PELVIC LYMPHADECTOMY AND PARA-AORTIC LYMPH NODE DISSECTION;  Surgeon: Everitt Amber, MD;  Location: [facility];  Service: Gynecology;  Laterality: N/A;    SOCIAL HISTORY:  Social History   Socioeconomic History   Marital status: Widowed    Spouse name: Not on file   Number of children: 3   Years of education: Not on file   Highest education level: Not on file  Occupational History   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  Tobacco Use   Smoking status: Never   Smokeless tobacco: Never  Vaping Use   Vaping Use: Never used  Substance and Sexual Activity   Alcohol use: Yes    Alcohol/week: 4.0 standard drinks    Types: 4 Glasses of wine per week    Comment: weekends   Drug use: Never   Sexual activity: Not Currently  Other Topics Concern   Not on file  Social History Narrative   Lives with daughter and granddaughter here in [place], moved 3 years ago      Cats: Zoe-46 years old, Eduard Clos, and Storm      Enjoys: walking when she can tolerate- spending time with granddaughter (her life)      Diet: eats all food groups   Caffeine: 1-2 cups at times, but does not tolerate it all the time   Water: 160 oz daily      Wears seat belt    Does not use phone while driving    Oceanographer at home   Fire extinguisher-no   No weapons   Social Determinants of Health   Financial Resource Strain: Not on file  Food Insecurity: Not on file  Transportation Needs: Not on file  Physical Activity: Inactive   Days of Exercise per Week: 0 days   Minutes of Exercise per Session: 0 min  Stress: Not on file  Social Connections: Not on file  Intimate Partner Violence: Not on file    FAMILY HISTORY:  Family History  Problem Relation Age of Onset   Hypertension Father    Heart disease Father    Cancer Father        lung   Hypertension Mother    Cancer Sister        ovarian cancer    CURRENT MEDICATIONS:  Current Outpatient Medications  Medication Sig Dispense Refill   amLODipine (NORVASC) 5 MG tablet Take 1 tablet (5 mg total) by mouth daily. 90 tablet 1   atorvastatin (LIPITOR) 20 MG tablet Take 1 tablet by mouth once daily 90 tablet 0   EPINEPHrine 0.3 mg/0.3 mL IJ SOAJ injection Inject 0.3 mg into the muscle as needed for anaphylaxis. 1 each 0   gabapentin (NEURONTIN) 300 MG capsule Take 1 capsule (300 mg total) by mouth 3 (three) times daily. 90 capsule 6    lidocaine-prilocaine (EMLA) cream Apply to port site prior to use 30 g 0   oxyCODONE-acetaminophen (PERCOCET) 5-325 MG tablet Take 1 tablet by mouth every 12 (twelve) hours as needed (pain). (Patient not taking: Reported on 10/11/2020) 60 tablet 0   PACLITAXEL IV Inject into the vein every 21 ( twenty-one) days.     prochlorperazine (COMPAZINE) 10 MG tablet Take 1 tablet (10 mg total) by mouth every 6 (six) hours as needed (Nausea or vomiting). 60 tablet 3   No current facility-administered medications for this visit.   Facility-Administered Medications Ordered in Other Visits  Medication Dose Route Frequency Provider Last Rate Last Admin   diphenhydrAMINE (BENADRYL) 50 MG/ML injection            palonosetron (ALOXI) 0.25 MG/5ML injection             ALLERGIES:  Allergies  Allergen Reactions   Carboplatin Other (See Comments)    "body on fire and abdominal pain"   Nickel Rash    itchy    PHYSICAL EXAM:  Performance status (ECOG): 1 - Symptomatic but completely ambulatory  There were no vitals filed for this visit. Wt Readings from Last 3 Encounters:  10/11/20 238 lb (108 kg)  09/20/20 238 lb 1.6 oz (108 kg)  08/30/20 237 lb (107.5 kg)   Physical Exam Vitals reviewed.  Constitutional:      Appearance: Normal appearance.  Cardiovascular:     Rate and Rhythm: Normal rate and regular rhythm.     Pulses: Normal pulses.     Heart sounds: Normal heart sounds.  Pulmonary:     Effort: Pulmonary effort is normal.     Breath sounds: Normal breath sounds.  Abdominal:     Palpations: Abdomen is soft. There is no hepatomegaly, splenomegaly or mass.     Tenderness: There is no abdominal tenderness.  Musculoskeletal:     Right lower leg: No tenderness.  Neurological:     General: No focal deficit present.     Mental Status: She is alert and oriented to person, place, and time.  Psychiatric:        Mood and Affect: Mood normal.        Behavior: Behavior normal.    LABORATORY  DATA:  I have reviewed the labs as listed.  CBC Latest Ref Rng & Units 10/11/2020 09/20/2020 08/30/2020  WBC 4.0 - 10.5 K/uL  7.5 9.7 8.4  Hemoglobin 12.0 - 15.0 g/dL 12.1 11.9(L) 12.0  Hematocrit 36.0 - 46.0 % 37.0 35.8(L) 37.1  Platelets 150 - 400 K/uL 327 310 305   CMP Latest Ref Rng & Units 10/11/2020 09/20/2020 08/30/2020  Glucose 70 - 99 mg/dL 120(H) 109(H) 129(H)  BUN 6 - 20 mg/dL 15 24(H) 19  Creatinine 0.44 - 1.00 mg/dL 0.61 0.71 0.64  Sodium 135 - 145 mmol/L 137 138 136  Potassium 3.5 - 5.1 mmol/L 3.8 3.6 3.8  Chloride 98 - 111 mmol/L 105 107 106  CO2 22 - 32 mmol/L 25 26 24   Calcium 8.9 - 10.3 mg/dL 9.2 8.8(L) 8.9  Total Protein 6.5 - 8.1 g/dL 7.2 7.0 7.0  Total Bilirubin 0.3 - 1.2 mg/dL 0.3 0.4 0.5  Alkaline Phos 38 - 126 U/L 91 104 107  AST 15 - 41 U/L 20 21 23   ALT 0 - 44 U/L 25 27 26     DIAGNOSTIC IMAGING:  I have independently reviewed the scans and discussed with the patient. No results found.   ASSESSMENT:  1.  Recurrent endometrial carcinosarcoma: -TAH, BSO, bilateral pelvic and para-aortic lymph node resections on 04/13/2018. -Pathology showed carcinosarcoma (malignant mixed mullerian tumor) is arising in an endometrial type polyp with no myometrial invasion identified.  High-grade.  0/39 lymph nodes positive.  PT1APN0, FIGO stage Ia.  MMR normal.  MSI-stable. -CTAP on 12/09/2018 for abdominal pain showed extensive peritoneal carcinomatosis and large soft tissue mass in the pelvis. -Biopsy of the omental mass on 12/28/2018 shows poorly differentiated carcinoma consistent with her prior malignancy. -Carboplatin and paclitaxel started on 12/29/2018. -CT scan on 05/02/2019 showed peritoneal implants in the low central small bowel mesentery and left pelvic sidewall have decreased in size measuring 1.5 x 1.9 cm and 1.7 x 2.2 cm.  Soft tissue nodule in the left lateral omentum measures 1 x 1.7 cm with no evidence of metastatic disease. -Continuation of chemotherapy until complete  response was recommended. -CT AP on 07/04/2019 showed continued positive response to therapy with scattered peritoneal metastasis in the pelvis and left omentum decreased in size.  No new metastatic disease. -CTAP on 10/24/2019 after 14 cycles showed substantial reduction in size of soft tissue nodules in the pelvis.  1.4 x 0.9 x 1 cm soft tissue density in the inferior serosal margin of the sigmoid colon, previously 1.7 x 1.1 x 1.7 cm.  2 small soft tissue nodules in the mesenteric adipose tissue above the urinary bladder measures 0.8 cm. -She has developed serious allergic reaction during cycle 14 with carboplatin. -I have discussed with Dr. Denman George.  Other options include adding ifosfamide to Taxol or Doxil and combination of lenvatinib with pembrolizumab.  In the lenvatinib pembrolizumab trial carcinosarcomas were not included. -Single agent paclitaxel started on 11/09/2019, dose reduced by 20% on 01/18/2020. -CTAP on 01/10/2020 with stable small peritoneal soft tissue nodules in the pelvis.  Resolution of soft tissue nodularity in the left abdominal omental fat with no new or progressive disease.   2.  Breast abnormality: -Mammogram on 05/02/2019 shows BI-RADS Category 0.  She reportedly took Covid shot 1 to 2 weeks prior to the mammogram.  Further scans rescheduled on 06/21/2019.   3.  Peripheral neuropathy: -She reported numbness and occasional pins-and-needles sensation in the bilateral toes, right more than left.  This has started about a week ago.   PLAN:  1.  Recurrent endometrial carcinosarcoma: - CTAP from 09/17/2020 showed multiple subcentimeter pelvic soft tissue nodules which are stable.-CA125 is  normal. - Reviewed labs from today which shows normal LFTs and CBC.  Last CEA was 5.9. - She will proceed with a dose reduced to paclitaxel 140 mg/m2.  We will keep a close eye on the neuropathy.  I will see her back in 7 weeks for follow-up.  I plan to repeat CT of the abdomen and pelvis prior to  next visit.   2.  Low back pain/right-sided abdominal pain: - She has on and off bilateral hip pains. - Continue oxycodone 5/325 3-4 times per week.   3.  Hypertension: -Continue Norvasc 5 mg daily.  Blood pressure is fairly controlled.   4.  Neuropathy: - Right foot tingling and numbness more than left foot is stable.  Continue gabapentin 600 mg at bedtime. - She also has right leg achiness which is also stable.   5.  Abnormal mammogram: - Abnormal mammogram on 05/15/2020, status postbiopsy which was benign. - She will have yearly mammograms.   Orders placed this encounter:  No orders of the defined types were placed in this encounter.    [name], MD [place] [phone]   I, Thana Ates, am acting as a scribe for Dr. [name].  I, [name] MD, have reviewed the above documentation for accuracy and completeness, and I agree with the above.

## 2020-11-01 ENCOUNTER — Other Ambulatory Visit: Payer: Self-pay

## 2020-11-01 ENCOUNTER — Inpatient Hospital Stay (HOSPITAL_COMMUNITY): Payer: Medicaid Other | Attending: Hematology

## 2020-11-01 ENCOUNTER — Encounter (HOSPITAL_COMMUNITY): Payer: Self-pay

## 2020-11-01 ENCOUNTER — Inpatient Hospital Stay (HOSPITAL_BASED_OUTPATIENT_CLINIC_OR_DEPARTMENT_OTHER): Payer: Medicaid Other | Admitting: Hematology

## 2020-11-01 ENCOUNTER — Inpatient Hospital Stay (HOSPITAL_COMMUNITY): Payer: Medicaid Other

## 2020-11-01 VITALS — BP 145/79 | HR 78 | Temp 96.6°F | Resp 20

## 2020-11-01 VITALS — BP 139/75 | HR 81 | Temp 96.7°F | Resp 20 | Wt 242.6 lb

## 2020-11-01 DIAGNOSIS — C541 Malignant neoplasm of endometrium: Secondary | ICD-10-CM

## 2020-11-01 DIAGNOSIS — Z90710 Acquired absence of both cervix and uterus: Secondary | ICD-10-CM | POA: Insufficient documentation

## 2020-11-01 DIAGNOSIS — I10 Essential (primary) hypertension: Secondary | ICD-10-CM | POA: Diagnosis not present

## 2020-11-01 DIAGNOSIS — Z90722 Acquired absence of ovaries, bilateral: Secondary | ICD-10-CM | POA: Insufficient documentation

## 2020-11-01 DIAGNOSIS — E785 Hyperlipidemia, unspecified: Secondary | ICD-10-CM | POA: Diagnosis not present

## 2020-11-01 DIAGNOSIS — Z79899 Other long term (current) drug therapy: Secondary | ICD-10-CM | POA: Insufficient documentation

## 2020-11-01 DIAGNOSIS — Z5111 Encounter for antineoplastic chemotherapy: Secondary | ICD-10-CM | POA: Diagnosis not present

## 2020-11-01 DIAGNOSIS — Z95828 Presence of other vascular implants and grafts: Secondary | ICD-10-CM

## 2020-11-01 DIAGNOSIS — C786 Secondary malignant neoplasm of retroperitoneum and peritoneum: Secondary | ICD-10-CM | POA: Insufficient documentation

## 2020-11-01 LAB — COMPREHENSIVE METABOLIC PANEL
ALT: 31 U/L (ref 0–44)
AST: 24 U/L (ref 15–41)
Albumin: 3.8 g/dL (ref 3.5–5.0)
Alkaline Phosphatase: 98 U/L (ref 38–126)
Anion gap: 9 (ref 5–15)
BUN: 18 mg/dL (ref 6–20)
CO2: 24 mmol/L (ref 22–32)
Calcium: 8.8 mg/dL — ABNORMAL LOW (ref 8.9–10.3)
Chloride: 106 mmol/L (ref 98–111)
Creatinine, Ser: 0.66 mg/dL (ref 0.44–1.00)
GFR, Estimated: >60 mL/min (ref >60)
Glucose, Bld: 113 mg/dL — ABNORMAL HIGH (ref 70–99)
Potassium: 3.7 mmol/L (ref 3.5–5.1)
Sodium: 139 mmol/L (ref 135–145)
Total Bilirubin: 0.6 mg/dL (ref 0.3–1.2)
Total Protein: 6.8 g/dL (ref 6.5–8.1)

## 2020-11-01 LAB — CBC WITH DIFFERENTIAL/PLATELET
Abs Immature Granulocytes: 0.05 10*3/uL (ref 0.00–0.07)
Basophils Absolute: 0.1 10*3/uL (ref 0.0–0.1)
Basophils Relative: 1 %
Eosinophils Absolute: 0.2 10*3/uL (ref 0.0–0.5)
Eosinophils Relative: 2 %
HCT: 36.7 % (ref 36.0–46.0)
Hemoglobin: 11.9 g/dL — ABNORMAL LOW (ref 12.0–15.0)
Immature Granulocytes: 1 %
Lymphocytes Relative: 34 %
Lymphs Abs: 2.9 10*3/uL (ref 0.7–4.0)
MCH: 28.9 pg (ref 26.0–34.0)
MCHC: 32.4 g/dL (ref 30.0–36.0)
MCV: 89.1 fL (ref 80.0–100.0)
Monocytes Absolute: 0.7 10*3/uL (ref 0.1–1.0)
Monocytes Relative: 8 %
Neutro Abs: 4.8 10*3/uL (ref 1.7–7.7)
Neutrophils Relative %: 54 %
Platelets: 311 10*3/uL (ref 150–400)
RBC: 4.12 MIL/uL (ref 3.87–5.11)
RDW: 15.6 % — ABNORMAL HIGH (ref 11.5–15.5)
WBC: 8.7 10*3/uL (ref 4.0–10.5)
nRBC: 0 % (ref 0.0–0.2)

## 2020-11-01 MED ORDER — FAMOTIDINE 20 MG IN NS 100 ML IVPB
20.0000 mg | Freq: Once | INTRAVENOUS | Status: AC
  Administered 2020-11-01: 20 mg via INTRAVENOUS
  Filled 2020-11-01: qty 20

## 2020-11-01 MED ORDER — SODIUM CHLORIDE 0.9% FLUSH
10.0000 mL | INTRAVENOUS | Status: DC | PRN
  Administered 2020-11-01: 10 mL

## 2020-11-01 MED ORDER — DIPHENHYDRAMINE HCL 50 MG/ML IJ SOLN
25.0000 mg | Freq: Once | INTRAMUSCULAR | Status: AC
  Administered 2020-11-01: 25 mg via INTRAVENOUS
  Filled 2020-11-01: qty 1

## 2020-11-01 MED ORDER — SODIUM CHLORIDE 0.9 % IV SOLN
140.0000 mg/m2 | Freq: Once | INTRAVENOUS | Status: AC
  Administered 2020-11-01: 306 mg via INTRAVENOUS
  Filled 2020-11-01: qty 51

## 2020-11-01 MED ORDER — HEPARIN SOD (PORK) LOCK FLUSH 100 UNIT/ML IV SOLN
500.0000 [IU] | Freq: Once | INTRAVENOUS | Status: AC | PRN
  Administered 2020-11-01: 500 [IU]

## 2020-11-01 MED ORDER — PALONOSETRON HCL INJECTION 0.25 MG/5ML
0.2500 mg | Freq: Once | INTRAVENOUS | Status: AC
  Administered 2020-11-01: 0.25 mg via INTRAVENOUS
  Filled 2020-11-01: qty 5

## 2020-11-01 MED ORDER — SODIUM CHLORIDE 0.9 % IV SOLN
10.0000 mg | Freq: Once | INTRAVENOUS | Status: AC
  Administered 2020-11-01: 10 mg via INTRAVENOUS
  Filled 2020-11-01: qty 10

## 2020-11-01 MED ORDER — SODIUM CHLORIDE 0.9 % IV SOLN: Freq: Once | INTRAVENOUS | Status: AC

## 2020-11-01 NOTE — Progress Notes (Signed)
Patient tolerated chemotherapy with no complaints voiced.  Side effects with management reviewed with understanding verbalized.  Port site clean and dry with no bruising or swelling noted at site.  Good blood return noted before and after administration of chemotherapy.  Band aid applied.  Patient left in satisfactory condition with VSS and no s/s of distress noted.   

## 2020-11-01 NOTE — Progress Notes (Signed)
Patient has been examined, vital signs and labs have been reviewed by Dr. Katragadda. ANC, Creatinine, LFTs, hemoglobin, and platelets are within treatment parameters per Dr. Katragadda. Patient may proceed with treatment per M.D.   

## 2020-11-01 NOTE — Patient Instructions (Signed)
[place] at [facility] Discharge Instructions  You were seen and examined by Dr. Delton Coombes today.  You will receive Taxol infusion today. Return in 3 weeks for labs and Taxol infusion.    Thank you for choosing [place] at [facility] to provide your oncology and hematology care.  To afford each patient quality time with our provider, please arrive at least 15 minutes before your scheduled appointment time.   If you have a lab appointment with the [facility] please come in thru the Main Entrance and check in at the main information desk.  You need to re-schedule your appointment should you arrive 10 or more minutes late.  We strive to give you quality time with our providers, and arriving late affects you and other patients whose appointments are after yours.  Also, if you no show three or more times for appointments you may be dismissed from the clinic at the providers discretion.     Again, thank you for choosing [facility].  Our hope is that these requests will decrease the amount of time that you wait before being seen by our physicians.       _____________________________________________________________  Should you have questions after your visit to [facility], please contact our office at [phone] and follow the prompts.  Our office hours are 8:00 a.m. and 4:30 p.m. Monday - Friday.  Please note that voicemails left after 4:00 p.m. may not be returned until the following business day.  We are closed weekends and major holidays.  You do have access to a nurse 24-7, just call the main number to the clinic [phone] and do not press any options, hold on the line and a nurse will answer the phone.    For prescription refill requests, have your pharmacy contact our office and allow 72 hours.    Due to Covid, you will need to wear a mask upon entering the hospital. If you do not have a mask, a mask will be  given to you at the Main Entrance upon arrival. For doctor visits, patients may have 1 support person age 47 or older with them. For treatment visits, patients can not have anyone with them due to social distancing guidelines and our immunocompromised population.

## 2020-11-01 NOTE — Patient Instructions (Signed)
[facility]  Discharge Instructions: Thank you for choosing [facility] to provide your oncology and hematology care.  If you have a lab appointment with the [facility], please come in thru the Main Entrance and check in at the main information desk.  Wear comfortable clothing and clothing appropriate for easy access to any Portacath or PICC line.   We strive to give you quality time with your provider. You may need to reschedule your appointment if you arrive late (15 or more minutes).  Arriving late affects you and other patients whose appointments are after yours.  Also, if you miss three or more appointments without notifying the office, you may be dismissed from the clinic at the provider's discretion.      For prescription refill requests, have your pharmacy contact our office and allow 72 hours for refills to be completed.    Today you received the following chemotherapy and/or immunotherapy agents Taxol.       To help prevent nausea and vomiting after your treatment, we encourage you to take your nausea medication as directed.  BELOW ARE SYMPTOMS THAT SHOULD BE REPORTED IMMEDIATELY: *FEVER GREATER THAN 100.4 F (38 C) OR HIGHER *CHILLS OR SWEATING *NAUSEA AND VOMITING THAT IS NOT CONTROLLED WITH YOUR NAUSEA MEDICATION *UNUSUAL SHORTNESS OF BREATH *UNUSUAL BRUISING OR BLEEDING *URINARY PROBLEMS (pain or burning when urinating, or frequent urination) *BOWEL PROBLEMS (unusual diarrhea, constipation, pain near the anus) TENDERNESS IN MOUTH AND THROAT WITH OR WITHOUT PRESENCE OF ULCERS (sore throat, sores in mouth, or a toothache) UNUSUAL RASH, SWELLING OR PAIN  UNUSUAL VAGINAL DISCHARGE OR ITCHING   Items with * indicate a potential emergency and should be followed up as soon as possible or go to the Emergency Department if any problems should occur.  Please show the CHEMOTHERAPY ALERT CARD or IMMUNOTHERAPY ALERT CARD at check-in to the Emergency  Department and triage nurse.  Should you have questions after your visit or need to cancel or reschedule your appointment, please contact [facility] [phone]  and follow the prompts.  Office hours are 8:00 a.m. to 4:30 p.m. Monday - Friday. Please note that voicemails left after 4:00 p.m. may not be returned until the following business day.  We are closed weekends and major holidays. You have access to a nurse at all times for urgent questions. Please call the main number to the clinic [phone] and follow the prompts.  For any non-urgent questions, you may also contact your provider using MyChart. We now offer e-Visits for anyone 44 and older to request care online for non-urgent symptoms. For details visit [email or web address].   Also download the MyChart app! Go to the app store, search "MyChart", open the app, select [facility], and log in with your MyChart username and password.  Due to Covid, a mask is required upon entering the hospital/clinic. If you do not have a mask, one will be given to you upon arrival. For doctor visits, patients may have 1 support person aged 75 or older with them. For treatment visits, patients cannot have anyone with them due to current Covid guidelines and our immunocompromised population.

## 2020-11-02 ENCOUNTER — Encounter (HOSPITAL_COMMUNITY): Payer: Self-pay | Admitting: Hematology

## 2020-11-13 ENCOUNTER — Encounter: Payer: Medicaid Other | Admitting: Nurse Practitioner

## 2020-11-13 ENCOUNTER — Encounter: Payer: Medicaid Other | Admitting: Family Medicine

## 2020-11-15 ENCOUNTER — Other Ambulatory Visit (HOSPITAL_COMMUNITY): Payer: Self-pay

## 2020-11-15 DIAGNOSIS — C541 Malignant neoplasm of endometrium: Secondary | ICD-10-CM

## 2020-11-22 ENCOUNTER — Inpatient Hospital Stay (HOSPITAL_COMMUNITY): Payer: Medicaid Other

## 2020-11-22 ENCOUNTER — Ambulatory Visit (HOSPITAL_COMMUNITY): Payer: Medicaid Other | Admitting: Hematology

## 2020-11-22 ENCOUNTER — Other Ambulatory Visit (HOSPITAL_COMMUNITY): Payer: Self-pay | Admitting: *Deleted

## 2020-11-22 ENCOUNTER — Inpatient Hospital Stay (HOSPITAL_COMMUNITY): Payer: Medicaid Other | Attending: Hematology

## 2020-11-22 VITALS — BP 149/90 | HR 85 | Temp 98.0°F | Resp 20 | Ht 64.0 in

## 2020-11-22 DIAGNOSIS — C541 Malignant neoplasm of endometrium: Secondary | ICD-10-CM

## 2020-11-22 DIAGNOSIS — I10 Essential (primary) hypertension: Secondary | ICD-10-CM | POA: Insufficient documentation

## 2020-11-22 DIAGNOSIS — C771 Secondary and unspecified malignant neoplasm of intrathoracic lymph nodes: Secondary | ICD-10-CM | POA: Insufficient documentation

## 2020-11-22 DIAGNOSIS — Z5111 Encounter for antineoplastic chemotherapy: Secondary | ICD-10-CM | POA: Insufficient documentation

## 2020-11-22 DIAGNOSIS — Z79899 Other long term (current) drug therapy: Secondary | ICD-10-CM | POA: Insufficient documentation

## 2020-11-22 DIAGNOSIS — Z95828 Presence of other vascular implants and grafts: Secondary | ICD-10-CM

## 2020-11-22 DIAGNOSIS — Z452 Encounter for adjustment and management of vascular access device: Secondary | ICD-10-CM | POA: Insufficient documentation

## 2020-11-22 DIAGNOSIS — C786 Secondary malignant neoplasm of retroperitoneum and peritoneum: Secondary | ICD-10-CM | POA: Insufficient documentation

## 2020-11-22 LAB — CBC WITH DIFFERENTIAL/PLATELET
Abs Immature Granulocytes: 0.02 10*3/uL (ref 0.00–0.07)
Basophils Absolute: 0.1 10*3/uL (ref 0.0–0.1)
Basophils Relative: 1 %
Eosinophils Absolute: 0.1 10*3/uL (ref 0.0–0.5)
Eosinophils Relative: 2 %
HCT: 35.5 % — ABNORMAL LOW (ref 36.0–46.0)
Hemoglobin: 11.7 g/dL — ABNORMAL LOW (ref 12.0–15.0)
Immature Granulocytes: 0 %
Lymphocytes Relative: 33 %
Lymphs Abs: 2.5 10*3/uL (ref 0.7–4.0)
MCH: 28.9 pg (ref 26.0–34.0)
MCHC: 33 g/dL (ref 30.0–36.0)
MCV: 87.7 fL (ref 80.0–100.0)
Monocytes Absolute: 0.6 10*3/uL (ref 0.1–1.0)
Monocytes Relative: 8 %
Neutro Abs: 4.3 10*3/uL (ref 1.7–7.7)
Neutrophils Relative %: 56 %
Platelets: 332 10*3/uL (ref 150–400)
RBC: 4.05 MIL/uL (ref 3.87–5.11)
RDW: 15.4 % (ref 11.5–15.5)
WBC: 7.5 10*3/uL (ref 4.0–10.5)
nRBC: 0 % (ref 0.0–0.2)

## 2020-11-22 LAB — COMPREHENSIVE METABOLIC PANEL
ALT: 25 U/L (ref 0–44)
AST: 22 U/L (ref 15–41)
Albumin: 3.9 g/dL (ref 3.5–5.0)
Alkaline Phosphatase: 96 U/L (ref 38–126)
Anion gap: 9 (ref 5–15)
BUN: 16 mg/dL (ref 6–20)
CO2: 25 mmol/L (ref 22–32)
Calcium: 9.1 mg/dL (ref 8.9–10.3)
Chloride: 106 mmol/L (ref 98–111)
Creatinine, Ser: 0.63 mg/dL (ref 0.44–1.00)
GFR, Estimated: >60 mL/min (ref >60)
Glucose, Bld: 145 mg/dL — ABNORMAL HIGH (ref 70–99)
Potassium: 3.6 mmol/L (ref 3.5–5.1)
Sodium: 140 mmol/L (ref 135–145)
Total Bilirubin: 0.5 mg/dL (ref 0.3–1.2)
Total Protein: 7 g/dL (ref 6.5–8.1)

## 2020-11-22 LAB — MAGNESIUM: Magnesium: 1.6 mg/dL — ABNORMAL LOW (ref 1.7–2.4)

## 2020-11-22 MED ORDER — HEPARIN SOD (PORK) LOCK FLUSH 100 UNIT/ML IV SOLN
500.0000 [IU] | Freq: Once | INTRAVENOUS | Status: AC | PRN
  Administered 2020-11-22: 500 [IU]

## 2020-11-22 MED ORDER — SODIUM CHLORIDE 0.9 % IV SOLN
10.0000 mg | Freq: Once | INTRAVENOUS | Status: AC
  Administered 2020-11-22: 10 mg via INTRAVENOUS
  Filled 2020-11-22: qty 10

## 2020-11-22 MED ORDER — OXYCODONE-ACETAMINOPHEN 5-325 MG PO TABS: 1.0000 | ORAL_TABLET | Freq: Two times a day (BID) | ORAL | 0 refills | Status: DC | PRN

## 2020-11-22 MED ORDER — DIPHENHYDRAMINE HCL 50 MG/ML IJ SOLN
25.0000 mg | Freq: Once | INTRAMUSCULAR | Status: AC
  Administered 2020-11-22: 25 mg via INTRAVENOUS
  Filled 2020-11-22: qty 1

## 2020-11-22 MED ORDER — SODIUM CHLORIDE 0.9 % IV SOLN
140.0000 mg/m2 | Freq: Once | INTRAVENOUS | Status: AC
  Administered 2020-11-22: 306 mg via INTRAVENOUS
  Filled 2020-11-22: qty 51

## 2020-11-22 MED ORDER — PALONOSETRON HCL INJECTION 0.25 MG/5ML
0.2500 mg | Freq: Once | INTRAVENOUS | Status: AC
  Administered 2020-11-22: 0.25 mg via INTRAVENOUS
  Filled 2020-11-22: qty 5

## 2020-11-22 MED ORDER — SODIUM CHLORIDE 0.9 % IV SOLN: Freq: Once | INTRAVENOUS | Status: AC

## 2020-11-22 MED ORDER — FAMOTIDINE 20 MG IN NS 100 ML IVPB
20.0000 mg | Freq: Once | INTRAVENOUS | Status: AC
  Administered 2020-11-22: 20 mg via INTRAVENOUS
  Filled 2020-11-22: qty 20

## 2020-11-22 MED ORDER — SODIUM CHLORIDE 0.9% FLUSH
10.0000 mL | INTRAVENOUS | Status: DC | PRN
  Administered 2020-11-22: 10 mL

## 2020-11-22 NOTE — Progress Notes (Signed)
Patient presents today for Taxol infusion per providers order.  Vital signs within parameters for treatment.  Labs pending.  Patient states that her neuropathy is unchanged, not worse or better.  No new complaints at this time.  Taxol infusion given today per MD orders.  Stable during infusion without adverse affects.  Vital signs stable.  No complaints at this time.  Discharge from clinic ambulatory in stable condition.  Alert and oriented X 3.  Follow up with [facility] as scheduled.

## 2020-11-22 NOTE — Patient Instructions (Signed)
[facility]  Discharge Instructions: Thank you for choosing [facility] to provide your oncology and hematology care.  If you have a lab appointment with the [facility], please come in thru the Main Entrance and check in at the main information desk.  Wear comfortable clothing and clothing appropriate for easy access to any Portacath or PICC line.   We strive to give you quality time with your provider. You may need to reschedule your appointment if you arrive late (15 or more minutes).  Arriving late affects you and other patients whose appointments are after yours.  Also, if you miss three or more appointments without notifying the office, you may be dismissed from the clinic at the provider's discretion.      For prescription refill requests, have your pharmacy contact our office and allow 72 hours for refills to be completed.    Today you received the following chemotherapy and/or immunotherapy agents Taxol      To help prevent nausea and vomiting after your treatment, we encourage you to take your nausea medication as directed.  BELOW ARE SYMPTOMS THAT SHOULD BE REPORTED IMMEDIATELY: *FEVER GREATER THAN 100.4 F (38 C) OR HIGHER *CHILLS OR SWEATING *NAUSEA AND VOMITING THAT IS NOT CONTROLLED WITH YOUR NAUSEA MEDICATION *UNUSUAL SHORTNESS OF BREATH *UNUSUAL BRUISING OR BLEEDING *URINARY PROBLEMS (pain or burning when urinating, or frequent urination) *BOWEL PROBLEMS (unusual diarrhea, constipation, pain near the anus) TENDERNESS IN MOUTH AND THROAT WITH OR WITHOUT PRESENCE OF ULCERS (sore throat, sores in mouth, or a toothache) UNUSUAL RASH, SWELLING OR PAIN  UNUSUAL VAGINAL DISCHARGE OR ITCHING   Items with * indicate a potential emergency and should be followed up as soon as possible or go to the Emergency Department if any problems should occur.  Please show the CHEMOTHERAPY ALERT CARD or IMMUNOTHERAPY ALERT CARD at check-in to the Emergency Department  and triage nurse.  Should you have questions after your visit or need to cancel or reschedule your appointment, please contact [facility] [phone]  and follow the prompts.  Office hours are 8:00 a.m. to 4:30 p.m. Monday - Friday. Please note that voicemails left after 4:00 p.m. may not be returned until the following business day.  We are closed weekends and major holidays. You have access to a nurse at all times for urgent questions. Please call the main number to the clinic [phone] and follow the prompts.  For any non-urgent questions, you may also contact your provider using MyChart. We now offer e-Visits for anyone 58 and older to request care online for non-urgent symptoms. For details visit [email or web address].   Also download the MyChart app! Go to the app store, search "MyChart", open the app, select [facility], and log in with your MyChart username and password.  Due to Covid, a mask is required upon entering the hospital/clinic. If you do not have a mask, one will be given to you upon arrival. For doctor visits, patients may have 1 support person aged 41 or older with them. For treatment visits, patients cannot have anyone with them due to current Covid guidelines and our immunocompromised population.

## 2020-11-23 LAB — CA 125: Cancer Antigen (CA) 125: 8.1 U/mL (ref 0.0–38.1)

## 2020-12-07 ENCOUNTER — Other Ambulatory Visit: Payer: Self-pay

## 2020-12-07 ENCOUNTER — Encounter (HOSPITAL_COMMUNITY): Payer: Self-pay

## 2020-12-07 ENCOUNTER — Emergency Department (HOSPITAL_COMMUNITY): Payer: Medicaid Other

## 2020-12-07 ENCOUNTER — Inpatient Hospital Stay (HOSPITAL_COMMUNITY)
Admission: EM | Admit: 2020-12-07 | Discharge: 2020-12-09 | DRG: 194 | Disposition: A | Discharge status: Home / Self Care | Payer: Medicaid Other | Attending: Internal Medicine | Admitting: Internal Medicine

## 2020-12-07 DIAGNOSIS — Z20822 Contact with and (suspected) exposure to covid-19: Secondary | ICD-10-CM | POA: Diagnosis present

## 2020-12-07 DIAGNOSIS — C541 Malignant neoplasm of endometrium: Secondary | ICD-10-CM | POA: Diagnosis not present

## 2020-12-07 DIAGNOSIS — T451X5A Adverse effect of antineoplastic and immunosuppressive drugs, initial encounter: Secondary | ICD-10-CM | POA: Diagnosis present

## 2020-12-07 DIAGNOSIS — I959 Hypotension, unspecified: Secondary | ICD-10-CM | POA: Diagnosis not present

## 2020-12-07 DIAGNOSIS — G620 Drug-induced polyneuropathy: Secondary | ICD-10-CM | POA: Diagnosis present

## 2020-12-07 DIAGNOSIS — Z888 Allergy status to other drugs, medicaments and biological substances status: Secondary | ICD-10-CM

## 2020-12-07 DIAGNOSIS — I10 Essential (primary) hypertension: Secondary | ICD-10-CM | POA: Diagnosis not present

## 2020-12-07 DIAGNOSIS — Z79899 Other long term (current) drug therapy: Secondary | ICD-10-CM

## 2020-12-07 DIAGNOSIS — E785 Hyperlipidemia, unspecified: Secondary | ICD-10-CM | POA: Diagnosis present

## 2020-12-07 DIAGNOSIS — Z91048 Other nonmedicinal substance allergy status: Secondary | ICD-10-CM

## 2020-12-07 DIAGNOSIS — Z8249 Family history of ischemic heart disease and other diseases of the circulatory system: Secondary | ICD-10-CM

## 2020-12-07 DIAGNOSIS — R0902 Hypoxemia: Secondary | ICD-10-CM | POA: Diagnosis present

## 2020-12-07 DIAGNOSIS — R6510 Systemic inflammatory response syndrome (SIRS) of non-infectious origin without acute organ dysfunction: Secondary | ICD-10-CM | POA: Diagnosis not present

## 2020-12-07 DIAGNOSIS — J101 Influenza due to other identified influenza virus with other respiratory manifestations: Principal | ICD-10-CM | POA: Diagnosis present

## 2020-12-07 DIAGNOSIS — Z8041 Family history of malignant neoplasm of ovary: Secondary | ICD-10-CM

## 2020-12-07 DIAGNOSIS — E860 Dehydration: Secondary | ICD-10-CM | POA: Diagnosis present

## 2020-12-07 DIAGNOSIS — E876 Hypokalemia: Secondary | ICD-10-CM | POA: Diagnosis present

## 2020-12-07 LAB — LACTIC ACID, PLASMA
Lactic Acid, Venous: 1 mmol/L (ref 0.5–1.9)
Lactic Acid, Venous: 0.9 mmol/L (ref 0.5–1.9)

## 2020-12-07 LAB — CBC WITH DIFFERENTIAL/PLATELET
Abs Immature Granulocytes: 0.02 10*3/uL (ref 0.00–0.07)
Basophils Absolute: 0 10*3/uL (ref 0.0–0.1)
Basophils Relative: 1 %
Eosinophils Absolute: 0.1 10*3/uL (ref 0.0–0.5)
Eosinophils Relative: 1 %
HCT: 35.6 % — ABNORMAL LOW (ref 36.0–46.0)
Hemoglobin: 11.8 g/dL — ABNORMAL LOW (ref 12.0–15.0)
Immature Granulocytes: 0 %
Lymphocytes Relative: 32 %
Lymphs Abs: 1.5 10*3/uL (ref 0.7–4.0)
MCH: 28.6 pg (ref 26.0–34.0)
MCHC: 33.1 g/dL (ref 30.0–36.0)
MCV: 86.2 fL (ref 80.0–100.0)
Monocytes Absolute: 0.7 10*3/uL (ref 0.1–1.0)
Monocytes Relative: 15 %
Neutro Abs: 2.5 10*3/uL (ref 1.7–7.7)
Neutrophils Relative %: 51 %
Platelets: 322 10*3/uL (ref 150–400)
RBC: 4.13 MIL/uL (ref 3.87–5.11)
RDW: 15.9 % — ABNORMAL HIGH (ref 11.5–15.5)
WBC: 4.8 10*3/uL (ref 4.0–10.5)
nRBC: 0 % (ref 0.0–0.2)

## 2020-12-07 LAB — URINALYSIS, ROUTINE W REFLEX MICROSCOPIC
Bilirubin Urine: NEGATIVE
Glucose, UA: NEGATIVE mg/dL
Hgb urine dipstick: NEGATIVE
Ketones, ur: NEGATIVE mg/dL
Leukocytes,Ua: NEGATIVE
Nitrite: NEGATIVE
Protein, ur: NEGATIVE mg/dL
Specific Gravity, Urine: 1.002 — ABNORMAL LOW (ref 1.005–1.030)
pH: 7 (ref 5.0–8.0)

## 2020-12-07 LAB — COMPREHENSIVE METABOLIC PANEL
ALT: 34 U/L (ref 0–44)
AST: 27 U/L (ref 15–41)
Albumin: 3.8 g/dL (ref 3.5–5.0)
Alkaline Phosphatase: 113 U/L (ref 38–126)
Anion gap: 10 (ref 5–15)
BUN: 8 mg/dL (ref 6–20)
CO2: 21 mmol/L — ABNORMAL LOW (ref 22–32)
Calcium: 8.7 mg/dL — ABNORMAL LOW (ref 8.9–10.3)
Chloride: 105 mmol/L (ref 98–111)
Creatinine, Ser: 0.71 mg/dL (ref 0.44–1.00)
GFR, Estimated: >60 mL/min (ref >60)
Glucose, Bld: 149 mg/dL — ABNORMAL HIGH (ref 70–99)
Potassium: 3.1 mmol/L — ABNORMAL LOW (ref 3.5–5.1)
Sodium: 136 mmol/L (ref 135–145)
Total Bilirubin: 0.5 mg/dL (ref 0.3–1.2)
Total Protein: 6.7 g/dL (ref 6.5–8.1)

## 2020-12-07 LAB — RESP PANEL BY RT-PCR (FLU A&B, COVID) ARPGX2
Influenza A by PCR: POSITIVE — AB
Influenza B by PCR: NEGATIVE
SARS Coronavirus 2 by RT PCR: NEGATIVE

## 2020-12-07 LAB — PROTIME-INR
INR: 1 (ref 0.8–1.2)
Prothrombin Time: 13.2 seconds (ref 11.4–15.2)

## 2020-12-07 LAB — TROPONIN I (HIGH SENSITIVITY)
Troponin I (High Sensitivity): 5 ng/L (ref <18)
Troponin I (High Sensitivity): 5 ng/L (ref <18)

## 2020-12-07 LAB — HIV ANTIBODY (ROUTINE TESTING W REFLEX): HIV Screen 4th Generation wRfx: NONREACTIVE

## 2020-12-07 LAB — PROCALCITONIN: Procalcitonin: <0.1 ng/mL

## 2020-12-07 LAB — APTT: aPTT: 30 seconds (ref 24–36)

## 2020-12-07 MED ORDER — POTASSIUM CHLORIDE 10 MEQ/100ML IV SOLN
10.0000 meq | INTRAVENOUS | Status: AC
  Administered 2020-12-07: 10 meq via INTRAVENOUS
  Filled 2020-12-07: qty 100

## 2020-12-07 MED ORDER — VANCOMYCIN HCL 750 MG/150ML IV SOLN
750.0000 mg | Freq: Two times a day (BID) | INTRAVENOUS | Status: DC
  Administered 2020-12-08 – 2020-12-09 (×3): 750 mg via INTRAVENOUS
  Filled 2020-12-07 (×9): qty 150

## 2020-12-07 MED ORDER — LACTATED RINGERS IV SOLN
INTRAVENOUS | Status: DC
  Administered 2020-12-07: 200 mL via INTRAVENOUS

## 2020-12-07 MED ORDER — ONDANSETRON HCL 4 MG/2ML IJ SOLN: 4.0000 mg | Freq: Four times a day (QID) | INTRAMUSCULAR | Status: DC | PRN

## 2020-12-07 MED ORDER — ACETAMINOPHEN 325 MG PO TABS
650.0000 mg | ORAL_TABLET | Freq: Once | ORAL | Status: AC
  Administered 2020-12-07: 650 mg via ORAL
  Filled 2020-12-07: qty 2

## 2020-12-07 MED ORDER — ACETAMINOPHEN 650 MG RE SUPP: 650.0000 mg | Freq: Four times a day (QID) | RECTAL | Status: DC | PRN

## 2020-12-07 MED ORDER — ENOXAPARIN SODIUM 40 MG/0.4ML IJ SOSY
40.0000 mg | PREFILLED_SYRINGE | INTRAMUSCULAR | Status: DC
  Administered 2020-12-07 – 2020-12-09 (×3): 40 mg via SUBCUTANEOUS
  Filled 2020-12-07 (×3): qty 0.4

## 2020-12-07 MED ORDER — SODIUM CHLORIDE 0.9 % IV SOLN
500.0000 mg | INTRAVENOUS | Status: DC
  Administered 2020-12-07: 500 mg via INTRAVENOUS
  Filled 2020-12-07: qty 500

## 2020-12-07 MED ORDER — LACTATED RINGERS IV BOLUS (SEPSIS)
1000.0000 mL | Freq: Once | INTRAVENOUS | Status: AC
  Administered 2020-12-07: 1000 mL via INTRAVENOUS

## 2020-12-07 MED ORDER — OSELTAMIVIR PHOSPHATE 75 MG PO CAPS
75.0000 mg | ORAL_CAPSULE | Freq: Two times a day (BID) | ORAL | Status: DC
  Administered 2020-12-07 – 2020-12-09 (×5): 75 mg via ORAL
  Filled 2020-12-07 (×5): qty 1

## 2020-12-07 MED ORDER — PHENOL 1.4 % MT LIQD
1.0000 | OROMUCOSAL | Status: DC | PRN
  Administered 2020-12-07: 1 via OROMUCOSAL
  Filled 2020-12-07: qty 177

## 2020-12-07 MED ORDER — GABAPENTIN 300 MG PO CAPS
300.0000 mg | ORAL_CAPSULE | Freq: Three times a day (TID) | ORAL | Status: DC
  Administered 2020-12-07 – 2020-12-09 (×7): 300 mg via ORAL
  Filled 2020-12-07 (×7): qty 1

## 2020-12-07 MED ORDER — OXYCODONE-ACETAMINOPHEN 5-325 MG PO TABS: 1.0000 | ORAL_TABLET | Freq: Two times a day (BID) | ORAL | Status: DC | PRN

## 2020-12-07 MED ORDER — POTASSIUM CHLORIDE CRYS ER 20 MEQ PO TBCR
20.0000 meq | EXTENDED_RELEASE_TABLET | Freq: Once | ORAL | Status: AC
  Administered 2020-12-07: 20 meq via ORAL
  Filled 2020-12-07: qty 1

## 2020-12-07 MED ORDER — SODIUM CHLORIDE 0.9 % IV SOLN
2.0000 g | Freq: Three times a day (TID) | INTRAVENOUS | Status: DC
  Administered 2020-12-07 – 2020-12-09 (×7): 2 g via INTRAVENOUS
  Filled 2020-12-07 (×7): qty 2

## 2020-12-07 MED ORDER — SODIUM CHLORIDE 0.9 % IV BOLUS
1000.0000 mL | Freq: Once | INTRAVENOUS | Status: AC
  Administered 2020-12-07: 1000 mL via INTRAVENOUS

## 2020-12-07 MED ORDER — MORPHINE SULFATE (PF) 4 MG/ML IV SOLN
4.0000 mg | Freq: Once | INTRAVENOUS | Status: AC
  Administered 2020-12-07: 4 mg via INTRAVENOUS
  Filled 2020-12-07: qty 1

## 2020-12-07 MED ORDER — VANCOMYCIN HCL 2000 MG/400ML IV SOLN
2000.0000 mg | Freq: Once | INTRAVENOUS | Status: AC
  Administered 2020-12-07: 2000 mg via INTRAVENOUS
  Filled 2020-12-07: qty 400

## 2020-12-07 MED ORDER — LACTATED RINGERS IV BOLUS (SEPSIS)
800.0000 mL | Freq: Once | INTRAVENOUS | Status: AC
  Administered 2020-12-07: 800 mL via INTRAVENOUS

## 2020-12-07 MED ORDER — ACETAMINOPHEN 325 MG PO TABS
650.0000 mg | ORAL_TABLET | Freq: Four times a day (QID) | ORAL | Status: DC | PRN
  Administered 2020-12-07: 650 mg via ORAL
  Filled 2020-12-07: qty 2

## 2020-12-07 MED ORDER — ATORVASTATIN CALCIUM 20 MG PO TABS
20.0000 mg | ORAL_TABLET | Freq: Every day | ORAL | Status: DC
  Administered 2020-12-07 – 2020-12-09 (×3): 20 mg via ORAL
  Filled 2020-12-07: qty 2
  Filled 2020-12-07 (×2): qty 1

## 2020-12-07 MED ORDER — SODIUM CHLORIDE 0.9 % IV SOLN
2.0000 g | INTRAVENOUS | Status: DC
  Administered 2020-12-07: 2 g via INTRAVENOUS
  Filled 2020-12-07: qty 20

## 2020-12-07 MED ORDER — ONDANSETRON HCL 4 MG PO TABS: 4.0000 mg | ORAL_TABLET | Freq: Four times a day (QID) | ORAL | Status: DC | PRN

## 2020-12-07 MED ORDER — ONDANSETRON HCL 4 MG/2ML IJ SOLN
4.0000 mg | Freq: Once | INTRAMUSCULAR | Status: AC
  Administered 2020-12-07: 4 mg via INTRAVENOUS
  Filled 2020-12-07: qty 2

## 2020-12-07 NOTE — ED Notes (Signed)
PT covid test recollected and sent to lab

## 2020-12-07 NOTE — ED Triage Notes (Signed)
Pov from home with cc of SOB with cough, sore throat, and a headache.  At home COVID test negative.  Family members have a cough.

## 2020-12-07 NOTE — H&P (Signed)
History and Physical  Lenzi Marmo [place] MVE:[id number] DOB: 01/24/1960 DOA: 12/07/2020   PCP: Noreene Larsson, NP   Patient coming from: Home  Chief Complaint: generalized weakness, sob  HPI:  Jessica [name] is a 60 y.o. female with medical history of Endometrial carcinosarcoma, hypertension, hyperlipidemia, peripheral neuropathy presenting with shortness of breath, coughing, generalized weakness beginning on 12/04/2020.  Her symptoms continue to progress with generalized weakness and sore throat and myalgias over the next 1 to 2 days.  She subsequently developed a headache but denies any visual disturbance or focal extremity weakness. She last had chemotherapy on 11/22/2020.  She had an episode of nausea and vomiting on 12/06/2020 without any blood.  She has not had any diarrhea, medication, melena, abdominal pain, dysuria, hematuria. In the emergency department, the patient was febrile up to 100.8 F.  She had low blood pressure of 88/31.  BMP shows sodium 136, potassium 3.1, bicarbonate 21, serum creatinine 0.71.  LFTs were unremarkable.  WBC 4.8, hemoglobin 11.8, platelets 222,000.  Lactic acid was 1.0.  Chest x-ray was negative for any infiltrates or edema.  The patient was started on maintenance IV fluids after the appropriate fluid boluses.  She was given ceftriaxone and azithromycin.  She did have some hypoxia into the upper 80s when she falls asleep, but was otherwise stable on room air  Assessment/Plan: SIRS -Patient presented with low blood pressure , tachycardia, fever -Likely secondary to influenza, but concerned about underlying bacterial infection given her immunocompromise state -Chest x-ray negative for infiltrates or edema -Follow blood cultures -UA negative for pyuria -Start empiric vancomycin and cefepime  Influenza with pulmonary manifestations -Continue oseltamavir  Essential hypertension -Holding amlodipine secondary to hypotension  Endometrial  carcinosarcoma -Initially noticed in March 2020 -Patient follows Dr. Delton Coombes -Last chemotherapy 11/21/2020  Hyperlipidemia -Continue statin  Peripheral neuropathy -Continue gabapentin  Hypokalemia -Replace -Check magnesium  Atypical chest pain -cycle troponins   Past Medical History:  Diagnosis Date   Cancer ([place])    Phreesia 09/05/2019   Elevated blood pressure reading    Endometrial cancer ([place])    Hyperlipidemia    Hypertension    PMB (postmenopausal bleeding)    Port-A-Cath in place 12/24/2018   Past Surgical History:  Procedure Laterality Date   ABDOMINAL HYSTERECTOMY N/A    Phreesia 09/05/2019   IR IMAGING GUIDED PORT INSERTION  12/28/2018   IR RADIOLOGIST EVAL & MGMT  06/02/2018   IR RADIOLOGIST EVAL & MGMT  06/16/2018   NECK SURGERY  2000   spinal surgery , titanium rod in place    ROBOTIC ASSISTED TOTAL HYSTERECTOMY WITH BILATERAL SALPINGO OOPHERECTOMY N/A 04/13/2018   Procedure: XI ROBOTIC ASSISTED TOTAL HYSTERECTOMY WITH BILATERAL SALPINGO OOPHORECTOMY;  Surgeon: Everitt Amber, MD;  Location: [facility];  Service: Gynecology;  Laterality: N/A;   ROBOTIC PELVIC AND PARA-AORTIC LYMPH NODE DISSECTION N/A 04/13/2018   Procedure: XI ROBOTIC PELVIC LYMPHADECTOMY AND PARA-AORTIC LYMPH NODE DISSECTION;  Surgeon: Everitt Amber, MD;  Location: [facility];  Service: Gynecology;  Laterality: N/A;   Social History:  reports that she has never smoked. She has never used smokeless tobacco. She reports current alcohol use of about 4.0 standard drinks per week. She reports that she does not use drugs.   Family History  Problem Relation Age of Onset   Hypertension Father    Heart disease Father    Cancer Father        lung   Hypertension Mother    Cancer Sister  ovarian cancer     Allergies  Allergen Reactions   Carboplatin Other (See Comments)    "body on fire and abdominal pain"   Nickel Rash    itchy     Prior to Admission medications   Medication Sig Start Date  End Date Taking? Authorizing Provider  amLODipine (NORVASC) 5 MG tablet Take 1 tablet (5 mg total) by mouth daily. 10/25/20  Yes Noreene Larsson, NP  atorvastatin (LIPITOR) 20 MG tablet Take 1 tablet by mouth once daily Patient taking differently: Take 20 mg by mouth daily. 09/27/20  Yes Fayrene Helper, MD  EPINEPHrine 0.3 mg/0.3 mL IJ SOAJ injection Inject 0.3 mg into the muscle as needed for anaphylaxis. 10/05/19  Yes Idol, Almyra Free, PA-C  gabapentin (NEURONTIN) 300 MG capsule Take 1 capsule (300 mg total) by mouth 3 (three) times daily. 07/18/20  Yes Gorsuch, Ni, MD  lidocaine-prilocaine (EMLA) cream Apply to port site prior to use Patient taking differently: 1 application daily as needed (apply to port site prior to chemo). 08/09/20  Yes [name], MD  oxyCODONE-acetaminophen (PERCOCET) 5-325 MG tablet Take 1 tablet by mouth every 12 (twelve) hours as needed (pain). 11/22/20  Yes [name], MD  PACLITAXEL IV Inject into the vein every 21 ( twenty-one) days. 12/29/18  Yes [provider]  prochlorperazine (COMPAZINE) 10 MG tablet Take 1 tablet (10 mg total) by mouth every 6 (six) hours as needed (Nausea or vomiting). 06/06/20  Yes [name], MD    Review of Systems:  Constitutional:  No weight loss, night sweats,  Head&Eyes: No headache.  No vision loss.  No eye pain or scotoma ENT:  No Difficulty swallowing,Tooth/dental problems,Sore throat,  No ear ache, post nasal drip,  Cardio-vascular:  No chest pain, Orthopnea, PND, swelling in lower extremities,  dizziness, palpitations  GI:  No  diarrhea, loss of appetite, hematochezia, melena, heartburn, indigestion, Resp:  No shortness of breath with exertion or at rest. No cough. No coughing up of blood .No wheezing.No chest wall deformity  Skin:  no rash or lesions.  GU:  no dysuria, change in color of urine, no urgency or frequency. No flank pain.  Musculoskeletal:  No joint pain or swelling. No  decreased range of motion. No back pain.  Psych:  No change in mood or affect. No depression or anxiety. Neurologic: No headache, no dysesthesia, no focal weakness, no vision loss. No syncope  Physical Exam: Vitals:   12/07/20 0900 12/07/20 0915 12/07/20 0927 12/07/20 1000  BP: (!) 76/39 (!) 100/58  107/65  Pulse: 70 70  64  Resp: (!) 9 12  12   Temp:   98.2 F (36.8 C)   TempSrc:   Oral   SpO2: 93% 98%  100%  Weight:      Height:       General:  A&O x 3, NAD, nontoxic, pleasant/cooperative Head/Eye: No conjunctival hemorrhage, no icterus, [place]/AT, No nystagmus ENT:  No icterus,  No thrush, good dentition, no pharyngeal exudate Neck:  No masses, no lymphadenpathy, no bruits CV:  RRR, no rub, no gallop, no S3 Lung:  bibasilar crackles. No wheeze Abdomen: soft/NT, +BS, nondistended, no peritoneal signs Ext: No cyanosis, No rashes, No petechiae, No lymphangitis, No edema Neuro: CNII-XII intact, strength 4/5 in bilateral upper and lower extremities, no dysmetria  Labs on Admission:  Basic Metabolic Panel: Recent Labs  Lab 12/07/20 0632  NA 136  K 3.1*  CL 105  CO2 21*  GLUCOSE 149*  BUN 8  CREATININE 0.71  CALCIUM 8.7*   Liver Function Tests: Recent Labs  Lab 12/07/20 0632  AST 27  ALT 34  ALKPHOS 113  BILITOT 0.5  PROT 6.7  ALBUMIN 3.8   No results for input(s): LIPASE, AMYLASE in the last 168 hours. No results for input(s): AMMONIA in the last 168 hours. CBC: Recent Labs  Lab 12/07/20 0632  WBC 4.8  NEUTROABS 2.5  HGB 11.8*  HCT 35.6*  MCV 86.2  PLT 322   Coagulation Profile: Recent Labs  Lab 12/07/20 0632  INR 1.0   Cardiac Enzymes: No results for input(s): CKTOTAL, CKMB, CKMBINDEX, TROPONINI in the last 168 hours. BNP: Invalid input(s): POCBNP CBG: No results for input(s): GLUCAP in the last 168 hours. Urine analysis:    Component Value Date/Time   COLORURINE COLORLESS (A) 12/07/2020 0934   APPEARANCEUR CLEAR 12/07/2020 0934   LABSPEC  1.002 (L) 12/07/2020 0934   PHURINE 7.0 12/07/2020 [facility] 12/07/2020 [facility] 12/07/2020 [facility] 12/07/2020 [facility] 12/07/2020 0934   PROTEINUR NEGATIVE 12/07/2020 0934   NITRITE NEGATIVE 12/07/2020 0934   LEUKOCYTESUR NEGATIVE 12/07/2020 0934   Sepsis Labs: @LABRCNTIP (procalcitonin:4,lacticidven:4) ) Recent Results (from the past 240 hour(s))  Blood Culture (routine x 2)     Status: None (Preliminary result)   Collection Time: 12/07/20  6:32 AM   Specimen: BLOOD RIGHT FOREARM  Result Value Ref Range Status   Specimen Description BLOOD RIGHT FOREARM  Final   Special Requests   Final    BOTTLES DRAWN AEROBIC AND ANAEROBIC Blood Culture adequate volume Performed at [facility], [address] [place], [place] [address]    Culture PENDING  Incomplete   Report Status PENDING  Incomplete  Blood Culture (routine x 2)     Status: None (Preliminary result)   Collection Time: 12/07/20  6:54 AM   Specimen: BLOOD LEFT WRIST  Result Value Ref Range Status   Specimen Description BLOOD LEFT WRIST  Final   Special Requests   Final    BOTTLES DRAWN AEROBIC AND ANAEROBIC Blood Culture adequate volume Performed at [facility], [address], [place], [place] [address]    Culture PENDING  Incomplete   Report Status PENDING  Incomplete  Resp Panel by RT-PCR (Flu A&B, Covid) Nasopharyngeal Swab     Status: Abnormal   Collection Time: 12/07/20  7:55 AM   Specimen: Nasopharyngeal Swab; Nasopharyngeal(NP) swabs in vial transport medium  Result Value Ref Range Status   SARS Coronavirus 2 by RT PCR NEGATIVE NEGATIVE Final    Comment: (NOTE) SARS-CoV-2 target nucleic acids are NOT DETECTED.  The SARS-CoV-2 RNA is generally detectable in upper respiratory specimens during the acute phase of infection. The lowest concentration of SARS-CoV-2 viral copies this assay can detect is 138 copies/mL. A negative result does not preclude  SARS-Cov-2 infection and should not be used as the sole basis for treatment or other patient management decisions. A negative result may occur with  improper specimen collection/handling, submission of specimen other than nasopharyngeal swab, presence of viral mutation(s) within the areas targeted by this assay, and inadequate number of viral copies(<138 copies/mL). A negative result must be combined with clinical observations, patient history, and epidemiological information. The expected result is Negative.  Fact Sheet for Patients:  EntrepreneurPulse.com.au  Fact Sheet for Healthcare Providers:  IncredibleEmployment.be  This test is no t yet approved or cleared by the [place] and  has been authorized  for detection and/or diagnosis of SARS-CoV-2 by FDA under an Emergency Use Authorization (EUA). This EUA will remain  in effect (meaning this test can be used) for the duration of the COVID-19 declaration under Section 564(b)(1) of the Act, 21 U.S.C.section 360bbb-3(b)(1), unless the authorization is terminated  or revoked sooner.       Influenza A by PCR POSITIVE (A) NEGATIVE Final   Influenza B by PCR NEGATIVE NEGATIVE Final    Comment: (NOTE) The Xpert Xpress SARS-CoV-2/FLU/RSV plus assay is intended as an aid in the diagnosis of influenza from Nasopharyngeal swab specimens and should not be used as a sole basis for treatment. Nasal washings and aspirates are unacceptable for Xpert Xpress SARS-CoV-2/FLU/RSV testing.  Fact Sheet for Patients: EntrepreneurPulse.com.au  Fact Sheet for Healthcare Providers: IncredibleEmployment.be  This test is not yet approved or cleared by the [place] FDA and has been authorized for detection and/or diagnosis of SARS-CoV-2 by FDA under an Emergency Use Authorization (EUA). This EUA will remain in effect (meaning this test can be used) for the duration of  the COVID-19 declaration under Section 564(b)(1) of the Act, 21 U.S.C. section 360bbb-3(b)(1), unless the authorization is terminated or revoked.  Performed at [facility], [address] [place], [place] [address]      Radiological Exams on Admission: DG Chest Port 1 View  Result Date: 12/07/2020 CLINICAL DATA:  60 year old female with possible sepsis. History of endometrial carcinoma. EXAM: PORTABLE CHEST 1 VIEW COMPARISON:  CT Chest, Abdomen, and Pelvis today are reported separately. 05/02/2019. FINDINGS: Portable AP semi upright view at 0643 hours. A chronic right chest power port. Low lung volumes. Normal cardiac size and mediastinal contours. Visualized tracheal air column is within normal limits. Allowing for portable technique the lungs are clear. No pneumothorax or pleural effusion. Negative visible bowel gas. Prior cervical ACDF. No acute osseous abnormality identified. IMPRESSION: No acute cardiopulmonary abnormality. Electronically Signed   By: Genevie Ann M.D.   On: 12/07/2020 07:01    EKG: Independently reviewed. Sinus, no STT change    Time spent:60 minutes Code Status:   FULL Family Communication:  No Family at bedside Disposition Plan: expect 1-2 day hospitalization Consults called: none DVT Prophylaxis: [place] Lovenox  Orson Eva, DO  [facility] Pager [phone]  If 7PM-7AM, please contact night-coverage [email or web address] Password [email or web address] 12/07/2020, 10:20 AM

## 2020-12-07 NOTE — Progress Notes (Signed)
Pharmacy Antibiotic Note  Jessica [name] is a 60 y.o. female admitted on 12/07/2020 with sepsis.  Pharmacy has been consulted for vancomycin and cefepime dosing.  Plan: Vancomycin 2000 mg IV x 1 dose. Vancomycin 750 mg IV every 12 hours. Cefepime 2000 mg IV every 8 hours. Monitor labs, c/s, and vanco level as indicated  Height: 5\' 4"  (162.6 cm) Weight: 108 kg (238 lb) IBW/kg (Calculated) : 54.7  Temp (24hrs), Avg:99.5 F (37.5 C), Min:98.2 F (36.8 C), Max:100.8 F (38.2 C)  Recent Labs  Lab 12/07/20 0632  WBC 4.8  CREATININE 0.71  LATICACIDVEN 1.0    Estimated Creatinine Clearance: 93.1 mL/min (by C-G formula based on SCr of 0.71 mg/dL).    Allergies  Allergen Reactions   Carboplatin Other (See Comments)    "body on fire and abdominal pain"   Nickel Rash    itchy    Antimicrobials this admission: Vanco 11/18 >> Cefepime 11/18 >>  Tamiflu 11/18 >> CTX/Azith 11/18 x 1  Microbiology results: 11/18 BCx: pending 11/18 UCx: pending  Influenza A: positive  Thank you for allowing pharmacy to be a part of this patient's care.  Margot Ables, PharmD Clinical Pharmacist 12/07/2020 11:44 AM

## 2020-12-07 NOTE — ED Provider Notes (Signed)
Emergency Medicine Provider Triage Evaluation Note  Jessica [name] , a 60 y.o. female  was evaluated in triage.  Pt complains of cough, fever.  Review of Systems  Positive: fever Negative: vomiting  Physical Exam  BP 95/80   Pulse (!) 106   Temp (!) 100.8 F (38.2 C)   Resp (!) 24   Ht 1.626 m (5\' 4" )   Wt 108 kg   SpO2 95%   BMI 40.85 kg/m  Gen:   Awake, distress noted Resp:  Tachypneic, decreased BS noted bilaterally MSK:   Moves extremities without difficulty  Other:  pale  Medical Decision Making  Medically screening exam initiated at 6:32 AM.  Appropriate orders placed.  Jessica [name] was informed that the remainder of the evaluation will be completed by another provider, this initial triage assessment does not replace that evaluation, and the importance of remaining in the ED until their evaluation is complete.  Code sepsis has been initiated   Jessica Fraise, MD 12/07/20 [phone]

## 2020-12-07 NOTE — Sepsis Progress Note (Signed)
Monitoring for the code sepsis protocol. °

## 2020-12-07 NOTE — ED Provider Notes (Signed)
[facility] EMERGENCY DEPARTMENT Provider Note   CSN: [id number] Arrival date & time: 12/07/20  6295     History Chief Complaint  Patient presents with   Shortness of Breath    Jessica [name] is a 60 y.o. female.  Pt presents to the ED today with sob.  The pt developed a cough, sore throat, and headache yesterday.  Her daughter also has a cough.  She is on chemo for a hx of endometrial carcinoma.  Her last chemo was 2 weeks ago.  Pt has not taken anything for her fever this am.      Past Medical History:  Diagnosis Date   Cancer ([place])    Phreesia 09/05/2019   Elevated blood pressure reading    Endometrial cancer ([place])    Hyperlipidemia    Hypertension    PMB (postmenopausal bleeding)    Port-A-Cath in place 12/24/2018    Patient Active Problem List   Diagnosis Date Noted   SIRS (systemic inflammatory response syndrome) ([place]) 12/07/2020   Peripheral neuropathy due to chemotherapy ([place]) 06/27/2020   Anemia due to antineoplastic chemotherapy 06/27/2020   Depression, major, single episode, moderate ([place]) 09/07/2019   Anxiety 09/07/2019   Port-A-Cath in place 12/24/2018   Goals of care, counseling/discussion 12/23/2018   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[place]) 12/14/2018   Elevated LFTs 05/05/2018   Hyperlipidemia 04/27/2018   Essential hypertension 04/26/2018   Endometrial cancer ([place]) 03/31/2018    Past Surgical History:  Procedure Laterality Date   ABDOMINAL HYSTERECTOMY N/A    Phreesia 09/05/2019   IR IMAGING GUIDED PORT INSERTION  12/28/2018   IR RADIOLOGIST EVAL & MGMT  06/02/2018   IR RADIOLOGIST EVAL & MGMT  06/16/2018   NECK SURGERY  2000   spinal surgery , titanium rod in place    ROBOTIC ASSISTED TOTAL HYSTERECTOMY WITH BILATERAL SALPINGO OOPHERECTOMY N/A 04/13/2018   Procedure: XI ROBOTIC [place];  Surgeon: Everitt Amber, MD;  Location: [facility] ORS;  Service: Gynecology;  Laterality: N/A;    ROBOTIC PELVIC AND PARA-AORTIC LYMPH NODE DISSECTION N/A 04/13/2018   Procedure: XI ROBOTIC PELVIC LYMPHADECTOMY AND PARA-AORTIC LYMPH NODE DISSECTION;  Surgeon: Everitt Amber, MD;  Location: [facility];  Service: Gynecology;  Laterality: N/A;     OB History     Gravida  4   Para  3   Term  3   Preterm  0   AB  1   Living  3      SAB  0   IAB  1   Ectopic  0   Multiple  0   Live Births  3           Family History  Problem Relation Age of Onset   Hypertension Father    Heart disease Father    Cancer Father        lung   Hypertension Mother    Cancer Sister        ovarian cancer    Social History   Tobacco Use   Smoking status: Never   Smokeless tobacco: Never  Vaping Use   Vaping Use: Never used  Substance Use Topics   Alcohol use: Yes    Alcohol/week: 4.0 standard drinks    Types: 4 Glasses of wine per week    Comment: weekends   Drug use: Never    Home Medications Prior to Admission medications   Medication Sig Start Date End Date Taking? Authorizing Provider  amLODipine (NORVASC) 5 MG tablet Take 1 tablet (5 mg total) by mouth daily. 10/25/20  Yes Noreene Larsson, NP  atorvastatin (LIPITOR) 20 MG tablet Take 1 tablet by mouth once daily Patient taking differently: Take 20 mg by mouth daily. 09/27/20  Yes Fayrene Helper, MD  EPINEPHrine 0.3 mg/0.3 mL IJ SOAJ injection Inject 0.3 mg into the muscle as needed for anaphylaxis. 10/05/19  Yes Idol, Almyra Free, PA-C  gabapentin (NEURONTIN) 300 MG capsule Take 1 capsule (300 mg total) by mouth 3 (three) times daily. 07/18/20  Yes Gorsuch, Ni, MD  lidocaine-prilocaine (EMLA) cream Apply to port site prior to use Patient taking differently: 1 application daily as needed (apply to port site prior to chemo). 08/09/20  Yes [name], MD  oxyCODONE-acetaminophen (PERCOCET) 5-325 MG tablet Take 1 tablet by mouth every 12 (twelve) hours as needed (pain). 11/22/20  Yes [name], MD  PACLITAXEL IV  Inject into the vein every 21 ( twenty-one) days. 12/29/18  Yes [provider]  prochlorperazine (COMPAZINE) 10 MG tablet Take 1 tablet (10 mg total) by mouth every 6 (six) hours as needed (Nausea or vomiting). 06/06/20  Yes [name], MD    Allergies    Carboplatin and Nickel  Review of Systems   Review of Systems  Constitutional:  Positive for fever.  Respiratory:  Positive for cough.   Neurological:  Positive for weakness.  All other systems reviewed and are negative.  Physical Exam Updated Vital Signs BP 107/65   Pulse 64   Temp 98.2 F (36.8 C) (Oral)   Resp 12   Ht 5\' 4"  (1.626 m)   Wt 108 kg   SpO2 100%   BMI 40.85 kg/m   Physical Exam Vitals and nursing note reviewed.  Constitutional:      Appearance: She is ill-appearing.  HENT:     Head: Normocephalic and atraumatic.     Mouth/Throat:     Mouth: Mucous membranes are dry.  Eyes:     Pupils: Pupils are equal, round, and reactive to light.  Cardiovascular:     Rate and Rhythm: Normal rate and regular rhythm.  Pulmonary:     Effort: Tachypnea present.  Abdominal:     General: Bowel sounds are normal.     Palpations: Abdomen is soft.  Musculoskeletal:        General: Normal range of motion.     Cervical back: Normal range of motion and neck supple.  Skin:    General: Skin is warm.     Capillary Refill: Capillary refill takes less than 2 seconds.  Neurological:     General: No focal deficit present.     Mental Status: She is alert and oriented to person, place, and time.  Psychiatric:        Mood and Affect: Mood normal.        Behavior: Behavior normal.    ED Results / Procedures / Treatments   Labs (all labs ordered are listed, but only abnormal results are displayed) Labs Reviewed  RESP PANEL BY RT-PCR (FLU A&B, COVID) ARPGX2 - Abnormal; Notable for the following components:      Result Value   Influenza A by PCR POSITIVE (*)    All other components within normal limits   COMPREHENSIVE METABOLIC PANEL - Abnormal; Notable for the following components:   Potassium 3.1 (*)    CO2 21 (*)    Glucose, Bld 149 (*)    Calcium 8.7 (*)    All other  components within normal limits  CBC WITH DIFFERENTIAL/PLATELET - Abnormal; Notable for the following components:   Hemoglobin 11.8 (*)    HCT 35.6 (*)    RDW 15.9 (*)    All other components within normal limits  URINALYSIS, ROUTINE W REFLEX MICROSCOPIC - Abnormal; Notable for the following components:   Color, Urine COLORLESS (*)    Specific Gravity, Urine 1.002 (*)    All other components within normal limits  CULTURE, BLOOD (ROUTINE X 2)  CULTURE, BLOOD (ROUTINE X 2)  URINE CULTURE  LACTIC ACID, PLASMA  PROTIME-INR  APTT    EKG EKG Interpretation  Date/Time:  Friday December 07 2020 06:33:42 EST Ventricular Rate:  91 PR Interval:  174 QRS Duration: 88 QT Interval:  326 QTC Calculation: 401 R Axis:   3 Text Interpretation: Sinus rhythm Low voltage, precordial leads Consider anterior infarct Confirmed by [name] [phone]) on 12/07/2020 6:53:54 AM  Radiology DG Chest Port 1 View  Result Date: 12/07/2020 CLINICAL DATA:  60 year old female with possible sepsis. History of endometrial carcinoma. EXAM: PORTABLE CHEST 1 VIEW COMPARISON:  CT Chest, Abdomen, and Pelvis today are reported separately. 05/02/2019. FINDINGS: Portable AP semi upright view at 0643 hours. A chronic right chest power port. Low lung volumes. Normal cardiac size and mediastinal contours. Visualized tracheal air column is within normal limits. Allowing for portable technique the lungs are clear. No pneumothorax or pleural effusion. Negative visible bowel gas. Prior cervical ACDF. No acute osseous abnormality identified. IMPRESSION: No acute cardiopulmonary abnormality. Electronically Signed   By: Genevie Ann M.D.   On: 12/07/2020 07:01    Procedures Procedures   Medications Ordered in ED Medications  lactated ringers infusion (200  mLs Intravenous New Bag/Given 12/07/20 0726)  cefTRIAXone (ROCEPHIN) 2 g in sodium chloride 0.9 % 100 mL IVPB (0 g Intravenous Stopped 12/07/20 0727)  azithromycin (ZITHROMAX) 500 mg in sodium chloride 0.9 % 250 mL IVPB (0 mg Intravenous Stopped 12/07/20 0832)  lactated ringers bolus 1,000 mL (0 mLs Intravenous Stopped 12/07/20 0832)    And  lactated ringers bolus 800 mL (0 mLs Intravenous Stopped 12/07/20 0833)  acetaminophen (TYLENOL) tablet 650 mg (650 mg Oral Given 12/07/20 0721)  morphine 4 MG/ML injection 4 mg (4 mg Intravenous Given 12/07/20 0803)  ondansetron (ZOFRAN) injection 4 mg (4 mg Intravenous Given 12/07/20 0732)  sodium chloride 0.9 % bolus 1,000 mL (1,000 mLs Intravenous Bolus 12/07/20 5643)    ED Course  I have reviewed the triage vital signs and the nursing notes.  Pertinent labs & imaging results that were available during my care of the patient were reviewed by me and considered in my medical decision making (see chart for details).    MDM Rules/Calculators/A&P                           BP initially at 35, so a code sepsis was called.  Pt given IV Rocephin and zithromax in case of pna due to cough and fever.  Pt's CXR clear and lactic neg.  However, bp remains low after sepsis fluids.  Flu is +.  O2 sat ok when pt is awake, but it was 88% when I went in the room and pt was asleep.  So, I put her on 2L oxygen.  Pt is immunocompromised and pressures are still soft, so I think she needs to come in.  Pt d/w Dr. Carles Collet ([facility]) for admission.  CRITICAL CARE Performed by: Isla Pence  Total critical care time: 30 minutes  Critical care time was exclusive of separately billable procedures and treating other patients.  Critical care was necessary to treat or prevent imminent or life-threatening deterioration.  Critical care was time spent personally by me on the following activities: development of treatment plan with patient and/or surrogate as well as nursing,  discussions with consultants, evaluation of patient's response to treatment, examination of patient, obtaining history from patient or surrogate, ordering and performing treatments and interventions, ordering and review of laboratory studies, ordering and review of radiographic studies, pulse oximetry and re-evaluation of patient's condition.   Jessica [name] was evaluated in Emergency Department on 12/07/2020 for the symptoms described in the history of present illness. She was evaluated in the context of the global COVID-19 pandemic, which necessitated consideration that the patient might be at risk for infection with the SARS-CoV-2 virus that causes COVID-19. Institutional protocols and algorithms that pertain to the evaluation of patients at risk for COVID-19 are in a state of rapid change based on information released by regulatory bodies including the CDC and federal and state organizations. These policies and algorithms were followed during the patient's care in the ED.  Final Clinical Impression(s) / ED Diagnoses Final diagnoses:  Influenza A  Dehydration  Hypotension, unspecified hypotension type  Hypoxia    Rx / DC Orders ED Discharge Orders     None        Isla Pence, MD 12/07/20 1018

## 2020-12-08 DIAGNOSIS — C541 Malignant neoplasm of endometrium: Secondary | ICD-10-CM | POA: Diagnosis present

## 2020-12-08 DIAGNOSIS — T451X5A Adverse effect of antineoplastic and immunosuppressive drugs, initial encounter: Secondary | ICD-10-CM | POA: Diagnosis present

## 2020-12-08 DIAGNOSIS — E785 Hyperlipidemia, unspecified: Secondary | ICD-10-CM | POA: Diagnosis present

## 2020-12-08 DIAGNOSIS — I959 Hypotension, unspecified: Secondary | ICD-10-CM | POA: Diagnosis present

## 2020-12-08 DIAGNOSIS — R0902 Hypoxemia: Secondary | ICD-10-CM | POA: Diagnosis present

## 2020-12-08 DIAGNOSIS — G620 Drug-induced polyneuropathy: Secondary | ICD-10-CM | POA: Diagnosis present

## 2020-12-08 DIAGNOSIS — J101 Influenza due to other identified influenza virus with other respiratory manifestations: Principal | ICD-10-CM

## 2020-12-08 DIAGNOSIS — Z20822 Contact with and (suspected) exposure to covid-19: Secondary | ICD-10-CM | POA: Diagnosis present

## 2020-12-08 DIAGNOSIS — I10 Essential (primary) hypertension: Secondary | ICD-10-CM | POA: Diagnosis present

## 2020-12-08 DIAGNOSIS — Z888 Allergy status to other drugs, medicaments and biological substances status: Secondary | ICD-10-CM | POA: Diagnosis not present

## 2020-12-08 DIAGNOSIS — Z91048 Other nonmedicinal substance allergy status: Secondary | ICD-10-CM | POA: Diagnosis not present

## 2020-12-08 DIAGNOSIS — R6510 Systemic inflammatory response syndrome (SIRS) of non-infectious origin without acute organ dysfunction: Secondary | ICD-10-CM | POA: Diagnosis present

## 2020-12-08 DIAGNOSIS — E860 Dehydration: Secondary | ICD-10-CM | POA: Diagnosis present

## 2020-12-08 DIAGNOSIS — E876 Hypokalemia: Secondary | ICD-10-CM | POA: Diagnosis present

## 2020-12-08 DIAGNOSIS — Z8249 Family history of ischemic heart disease and other diseases of the circulatory system: Secondary | ICD-10-CM | POA: Diagnosis not present

## 2020-12-08 DIAGNOSIS — Z8041 Family history of malignant neoplasm of ovary: Secondary | ICD-10-CM | POA: Diagnosis not present

## 2020-12-08 DIAGNOSIS — Z79899 Other long term (current) drug therapy: Secondary | ICD-10-CM | POA: Diagnosis not present

## 2020-12-08 LAB — URINE CULTURE: Culture: NO GROWTH

## 2020-12-08 LAB — CBC
HCT: 35.9 % — ABNORMAL LOW (ref 36.0–46.0)
Hemoglobin: 11.1 g/dL — ABNORMAL LOW (ref 12.0–15.0)
MCH: 27.7 pg (ref 26.0–34.0)
MCHC: 30.9 g/dL (ref 30.0–36.0)
MCV: 89.5 fL (ref 80.0–100.0)
Platelets: 288 10*3/uL (ref 150–400)
RBC: 4.01 MIL/uL (ref 3.87–5.11)
RDW: 15.9 % — ABNORMAL HIGH (ref 11.5–15.5)
WBC: 3.6 10*3/uL — ABNORMAL LOW (ref 4.0–10.5)
nRBC: 0 % (ref 0.0–0.2)

## 2020-12-08 LAB — BASIC METABOLIC PANEL
Anion gap: 7 (ref 5–15)
BUN: 8 mg/dL (ref 6–20)
CO2: 25 mmol/L (ref 22–32)
Calcium: 8.3 mg/dL — ABNORMAL LOW (ref 8.9–10.3)
Chloride: 107 mmol/L (ref 98–111)
Creatinine, Ser: 0.49 mg/dL (ref 0.44–1.00)
GFR, Estimated: >60 mL/min (ref >60)
Glucose, Bld: 103 mg/dL — ABNORMAL HIGH (ref 70–99)
Potassium: 3.8 mmol/L (ref 3.5–5.1)
Sodium: 139 mmol/L (ref 135–145)

## 2020-12-08 LAB — MAGNESIUM: Magnesium: 1.5 mg/dL — ABNORMAL LOW (ref 1.7–2.4)

## 2020-12-08 MED ORDER — POTASSIUM CHLORIDE CRYS ER 20 MEQ PO TBCR
40.0000 meq | EXTENDED_RELEASE_TABLET | Freq: Once | ORAL | Status: AC
  Administered 2020-12-08: 40 meq via ORAL
  Filled 2020-12-08: qty 2

## 2020-12-08 MED ORDER — MAGNESIUM HYDROXIDE 400 MG/5ML PO SUSP
30.0000 mL | Freq: Once | ORAL | Status: AC
  Administered 2020-12-08: 30 mL via ORAL
  Filled 2020-12-08: qty 30

## 2020-12-08 MED ORDER — MAGNESIUM SULFATE 4 GM/100ML IV SOLN
4.0000 g | Freq: Once | INTRAVENOUS | Status: AC
  Administered 2020-12-08: 4 g via INTRAVENOUS
  Filled 2020-12-08: qty 100

## 2020-12-08 NOTE — Progress Notes (Signed)
PROGRESS NOTE    Jessica [name]  IWL:[id number] DOB: 1960/12/23 DOA: 12/07/2020 PCP: Noreene Larsson, NP    Brief Narrative:  60 year old female with a history of endometrial cancer, hypertension, admitted to the hospital with cough, shortness of breath.  She was also generally weak and had myalgias.  She was found to be positive for influenza A.  She recently had chemotherapy on 11/3.  There was concern for underlying developing bacterial infection.  She was admitted to the hospital for further treatment.   Assessment & Plan:   Principal Problem:   SIRS (systemic inflammatory response syndrome) (HCC) Active Problems:   Endometrial cancer (HCC)   Essential hypertension   Peripheral neuropathy due to chemotherapy ([place])   SIRS -Suspect related to influenza -No obvious bacterial process at this time -Continue to follow blood cultures -If blood cultures remain negative tomorrow, will discontinue further IV antibiotics  Influenza A -Continue on Tamiflu -Respiratory status appears to be stable  Hypertension -Amlodipine on hold due to borderline blood pressures  Endometrial cancer -Continue follow-up with oncology  Hyperlipidemia -Continue statin  Peripheral neuropathy -Continue gabapentin    DVT prophylaxis: enoxaparin (LOVENOX) injection 40 mg Start: 12/07/20 1100  Code Status: Full code Family Communication: Discussed with patient Disposition Plan: Status is: Inpatient  Remains inpatient appropriate because: Continued observation for any developing sepsis         Consultants:    Procedures:    Antimicrobials:  Cefepime and vancomycin   Subjective: Reports that she is feeling better today.  Continues to have some cough, shortness of breath.  She has not had a bowel movement in 2 days and is starting to feel bloated.  Objective: Vitals:   12/07/20 2011 12/08/20 0346 12/08/20 0842 12/08/20 1346  BP: 129/78 118/75 135/74 124/83  Pulse: 77 71 85  89  Resp: 20 20  16   Temp: 98.4 F (36.9 C) 98.2 F (36.8 C)  98.4 F (36.9 C)  TempSrc: Oral Oral  Oral  SpO2: 99% 99% 96% 97%  Weight:      Height:        Intake/Output Summary (Last 24 hours) at 12/08/2020 2005 Last data filed at 12/08/2020 1732 Gross per 24 hour  Intake 1222.5 ml  Output 2100 ml  Net -877.5 ml   Filed Weights   12/07/20 0624  Weight: 108 kg    Examination:  General exam: Appears calm and comfortable  Respiratory system: Clear to auscultation. Respiratory effort normal. Cardiovascular system: S1 & S2 heard, RRR. No JVD, murmurs, rubs, gallops or clicks. No pedal edema. Gastrointestinal system: Abdomen is nondistended, soft and nontender. No organomegaly or masses felt. Normal bowel sounds heard. Central nervous system: Alert and oriented. No focal neurological deficits. Extremities: Symmetric 5 x 5 power. Skin: No rashes, lesions or ulcers Psychiatry: Judgement and insight appear normal. Mood & affect appropriate.     Data Reviewed: I have personally reviewed following labs and imaging studies  CBC: Recent Labs  Lab 12/07/20 0632 12/08/20 0615  WBC 4.8 3.6*  NEUTROABS 2.5  --   HGB 11.8* 11.1*  HCT 35.6* 35.9*  MCV 86.2 89.5  PLT 322 174   Basic Metabolic Panel: Recent Labs  Lab 12/07/20 0632 12/08/20 0615  NA 136 139  K 3.1* 3.8  CL 105 107  CO2 21* 25  GLUCOSE 149* 103*  BUN 8 8  CREATININE 0.71 0.49  CALCIUM 8.7* 8.3*  MG  --  1.5*   GFR: Estimated Creatinine Clearance: 93.1 mL/min (  by C-G formula based on SCr of 0.49 mg/dL). Liver Function Tests: Recent Labs  Lab 12/07/20 0632  AST 27  ALT 34  ALKPHOS 113  BILITOT 0.5  PROT 6.7  ALBUMIN 3.8   No results for input(s): LIPASE, AMYLASE in the last 168 hours. No results for input(s): AMMONIA in the last 168 hours. Coagulation Profile: Recent Labs  Lab 12/07/20 0632  INR 1.0   Cardiac Enzymes: No results for input(s): CKTOTAL, CKMB, CKMBINDEX, TROPONINI in the  last 168 hours. BNP (last 3 results) No results for input(s): PROBNP in the last 8760 hours. HbA1C: No results for input(s): HGBA1C in the last 72 hours. CBG: No results for input(s): GLUCAP in the last 168 hours. Lipid Profile: No results for input(s): CHOL, HDL, LDLCALC, TRIG, CHOLHDL, LDLDIRECT in the last 72 hours. Thyroid Function Tests: No results for input(s): TSH, T4TOTAL, FREET4, T3FREE, THYROIDAB in the last 72 hours. Anemia Panel: No results for input(s): VITAMINB12, FOLATE, FERRITIN, TIBC, IRON, RETICCTPCT in the last 72 hours. Sepsis Labs: Recent Labs  Lab 12/07/20 3500 12/07/20 1049 12/07/20 1259  PROCALCITON  --  <0.10  --   LATICACIDVEN 1.0  --  0.9    Recent Results (from the past 240 hour(s))  Blood Culture (routine x 2)     Status: None (Preliminary result)   Collection Time: 12/07/20  6:32 AM   Specimen: BLOOD RIGHT FOREARM  Result Value Ref Range Status   Specimen Description BLOOD RIGHT FOREARM  Final   Special Requests   Final    BOTTLES DRAWN AEROBIC AND ANAEROBIC Blood Culture adequate volume   Culture   Final    NO GROWTH 1 DAY Performed at [facility], [address] [place], [place] [address]    Report Status PENDING  Incomplete  Blood Culture (routine x 2)     Status: None (Preliminary result)   Collection Time: 12/07/20  6:54 AM   Specimen: BLOOD LEFT WRIST  Result Value Ref Range Status   Specimen Description BLOOD LEFT WRIST  Final   Special Requests   Final    BOTTLES DRAWN AEROBIC AND ANAEROBIC Blood Culture adequate volume   Culture   Final    NO GROWTH 1 DAY Performed at [facility], [address], [place], [place] [address]    Report Status PENDING  Incomplete  Resp Panel by RT-PCR (Flu A&B, Covid) Nasopharyngeal Swab     Status: Abnormal   Collection Time: 12/07/20  7:55 AM   Specimen: Nasopharyngeal Swab; Nasopharyngeal(NP) swabs in vial transport medium  Result Value Ref Range Status   SARS Coronavirus 2 by RT PCR NEGATIVE  NEGATIVE Final    Comment: (NOTE) SARS-CoV-2 target nucleic acids are NOT DETECTED.  The SARS-CoV-2 RNA is generally detectable in upper respiratory specimens during the acute phase of infection. The lowest concentration of SARS-CoV-2 viral copies this assay can detect is 138 copies/mL. A negative result does not preclude SARS-Cov-2 infection and should not be used as the sole basis for treatment or other patient management decisions. A negative result may occur with  improper specimen collection/handling, submission of specimen other than nasopharyngeal swab, presence of viral mutation(s) within the areas targeted by this assay, and inadequate number of viral copies(<138 copies/mL). A negative result must be combined with clinical observations, patient history, and epidemiological information. The expected result is Negative.  Fact Sheet for Patients:  EntrepreneurPulse.com.au  Fact Sheet for Healthcare Providers:  IncredibleEmployment.be  This test is no t yet approved or cleared  by the [place] and  has been authorized for detection and/or diagnosis of SARS-CoV-2 by FDA under an Emergency Use Authorization (EUA). This EUA will remain  in effect (meaning this test can be used) for the duration of the COVID-19 declaration under Section 564(b)(1) of the Act, 21 U.S.C.section 360bbb-3(b)(1), unless the authorization is terminated  or revoked sooner.       Influenza A by PCR POSITIVE (A) NEGATIVE Final   Influenza B by PCR NEGATIVE NEGATIVE Final    Comment: (NOTE) The Xpert Xpress SARS-CoV-2/FLU/RSV plus assay is intended as an aid in the diagnosis of influenza from Nasopharyngeal swab specimens and should not be used as a sole basis for treatment. Nasal washings and aspirates are unacceptable for Xpert Xpress SARS-CoV-2/FLU/RSV testing.  Fact Sheet for Patients: EntrepreneurPulse.com.au  Fact Sheet for  Healthcare Providers: IncredibleEmployment.be  This test is not yet approved or cleared by the [place] FDA and has been authorized for detection and/or diagnosis of SARS-CoV-2 by FDA under an Emergency Use Authorization (EUA). This EUA will remain in effect (meaning this test can be used) for the duration of the COVID-19 declaration under Section 564(b)(1) of the Act, 21 U.S.C. section 360bbb-3(b)(1), unless the authorization is terminated or revoked.  Performed at [facility], [address], [place], [place] [address]   Urine Culture     Status: None   Collection Time: 12/07/20  9:34 AM   Specimen: In/Out Cath Urine  Result Value Ref Range Status   Specimen Description   Final    IN/OUT CATH URINE Performed at [facility], [address], [place], [place] [address]    Special Requests   Final    NONE Performed at [facility], [address] [place], [place] [address]    Culture   Final    NO GROWTH Performed at [facility], [place] [address], [place], [place] [address]    Report Status 12/08/2020 FINAL  Final         Radiology Studies: DG Chest Port 1 View  Result Date: 12/07/2020 CLINICAL DATA:  60 year old female with possible sepsis. History of endometrial carcinoma. EXAM: PORTABLE CHEST 1 VIEW COMPARISON:  CT Chest, Abdomen, and Pelvis today are reported separately. 05/02/2019. FINDINGS: Portable AP semi upright view at 0643 hours. A chronic right chest power port. Low lung volumes. Normal cardiac size and mediastinal contours. Visualized tracheal air column is within normal limits. Allowing for portable technique the lungs are clear. No pneumothorax or pleural effusion. Negative visible bowel gas. Prior cervical ACDF. No acute osseous abnormality identified. IMPRESSION: No acute cardiopulmonary abnormality. Electronically Signed   By: Genevie Ann M.D.   On: 12/07/2020 07:01        Scheduled Meds:  atorvastatin  20 mg Oral Daily    enoxaparin (LOVENOX) injection  40 mg Subcutaneous Q24H   gabapentin  300 mg Oral TID   oseltamivir  75 mg Oral BID   Continuous Infusions:  ceFEPime (MAXIPIME) IV 2 g (12/08/20 1724)   vancomycin 750 mg (12/08/20 1448)     LOS: 0 days    Time spent: 22mins    Kathie Dike, MD [facility]   If 7PM-7AM, please contact night-coverage [email or web address]  12/08/2020, 8:05 PM

## 2020-12-09 DIAGNOSIS — E860 Dehydration: Secondary | ICD-10-CM | POA: Diagnosis not present

## 2020-12-09 DIAGNOSIS — R6510 Systemic inflammatory response syndrome (SIRS) of non-infectious origin without acute organ dysfunction: Secondary | ICD-10-CM | POA: Diagnosis not present

## 2020-12-09 DIAGNOSIS — I10 Essential (primary) hypertension: Secondary | ICD-10-CM | POA: Diagnosis not present

## 2020-12-09 DIAGNOSIS — C541 Malignant neoplasm of endometrium: Secondary | ICD-10-CM | POA: Diagnosis not present

## 2020-12-09 LAB — BASIC METABOLIC PANEL
Anion gap: 7 (ref 5–15)
BUN: 11 mg/dL (ref 6–20)
CO2: 26 mmol/L (ref 22–32)
Calcium: 8.5 mg/dL — ABNORMAL LOW (ref 8.9–10.3)
Chloride: 105 mmol/L (ref 98–111)
Creatinine, Ser: 0.55 mg/dL (ref 0.44–1.00)
GFR, Estimated: >60 mL/min (ref >60)
Glucose, Bld: 105 mg/dL — ABNORMAL HIGH (ref 70–99)
Potassium: 4.1 mmol/L (ref 3.5–5.1)
Sodium: 138 mmol/L (ref 135–145)

## 2020-12-09 LAB — MAGNESIUM: Magnesium: 2.5 mg/dL — ABNORMAL HIGH (ref 1.7–2.4)

## 2020-12-09 MED ORDER — ALBUTEROL SULFATE HFA 108 (90 BASE) MCG/ACT IN AERS: 2.0000 | INHALATION_SPRAY | Freq: Four times a day (QID) | RESPIRATORY_TRACT | 2 refills | Status: DC | PRN

## 2020-12-09 MED ORDER — OSELTAMIVIR PHOSPHATE 75 MG PO CAPS: 75.0000 mg | ORAL_CAPSULE | Freq: Two times a day (BID) | ORAL | 0 refills | Status: DC

## 2020-12-09 MED ORDER — GUAIFENESIN ER 600 MG PO TB12: 600.0000 mg | ORAL_TABLET | Freq: Two times a day (BID) | ORAL | 2 refills | Status: DC

## 2020-12-09 NOTE — Discharge Summary (Signed)
Physician Discharge Summary  Jessica [name] XHB:[id number] DOB: 1960/11/20 DOA: 12/07/2020  PCP: Noreene Larsson, NP  Admit date: 12/07/2020 Discharge date: 12/09/2020  Admitted From: Home Disposition: Home  Recommendations for Outpatient Follow-up:  Follow up with PCP in 1-2 weeks Please obtain BMP/CBC in one week Follow-up with oncology, Dr. Delton Coombes on 12/2 as previously scheduled  Home Health: Equipment/Devices:  Discharge Condition: Stable CODE STATUS: Full code Diet recommendation: Heart healthy  Brief/Interim Summary: 60 year old female with a history of endometrial cancer, hypertension, admitted to the hospital with cough, shortness of breath.  She was also generally weak and had myalgias.  She was found to be positive for influenza A.  She recently had chemotherapy on 11/3.  There was concern for underlying developing bacterial infection.  She was admitted to the hospital for further treatment.  Discharge Diagnoses:  Principal Problem:   SIRS (systemic inflammatory response syndrome) (HCC) Active Problems:   Endometrial cancer (HCC)   Essential hypertension   Peripheral neuropathy due to chemotherapy ([place])  SIRS -Suspect related to influenza -No obvious bacterial process at this time -She was initially treated with intravenous antibiotics -Blood cultures remain negative for 48 hours, therefore antibiotics were discontinued.  She was afebrile and appeared to be systemically well   Influenza A -Continue on Tamiflu -Respiratory status appears to be stable -We will continue on albuterol as needed as well as mucolytic's   Hypertension -Resume amlodipine on discharge   Endometrial cancer -Continue follow-up with oncology   Hyperlipidemia -Continue statin   Peripheral neuropathy -Continue gabapentin   Discharge Instructions  Discharge Instructions     Diet - low sodium heart healthy   Complete by: As directed    Increase activity slowly   Complete  by: As directed       Allergies as of 12/09/2020       Reactions   Carboplatin Other (See Comments)   "body on fire and abdominal pain"   Nickel Rash   itchy        Medication List     TAKE these medications    albuterol 108 (90 Base) MCG/ACT inhaler Commonly known as: VENTOLIN HFA Inhale 2 puffs into the lungs every 6 (six) hours as needed for wheezing or shortness of breath.   amLODipine 5 MG tablet Commonly known as: NORVASC Take 1 tablet (5 mg total) by mouth daily.   atorvastatin 20 MG tablet Commonly known as: LIPITOR Take 1 tablet by mouth once daily   EPINEPHrine 0.3 mg/0.3 mL Soaj injection Commonly known as: EPI-PEN Inject 0.3 mg into the muscle as needed for anaphylaxis.   gabapentin 300 MG capsule Commonly known as: NEURONTIN Take 1 capsule (300 mg total) by mouth 3 (three) times daily.   guaiFENesin 600 MG 12 hr tablet Commonly known as: Mucinex Take 1 tablet (600 mg total) by mouth 2 (two) times daily.   lidocaine-prilocaine cream Commonly known as: EMLA Apply to port site prior to use What changed:  how much to take when to take this reasons to take this additional instructions   oseltamivir 75 MG capsule Commonly known as: TAMIFLU Take 1 capsule (75 mg total) by mouth 2 (two) times daily.   oxyCODONE-acetaminophen 5-325 MG tablet Commonly known as: Percocet Take 1 tablet by mouth every 12 (twelve) hours as needed (pain).   PACLITAXEL IV Inject into the vein every 21 ( twenty-one) days.   prochlorperazine 10 MG tablet Commonly known as: COMPAZINE Take 1 tablet (10 mg total) by mouth every  6 (six) hours as needed (Nausea or vomiting).        Allergies  Allergen Reactions   Carboplatin Other (See Comments)    "body on fire and abdominal pain"   Nickel Rash    itchy    Consultations:    Procedures/Studies: DG Chest Port 1 View  Result Date: 12/07/2020 CLINICAL DATA:  60 year old female with possible sepsis. History  of endometrial carcinoma. EXAM: PORTABLE CHEST 1 VIEW COMPARISON:  CT Chest, Abdomen, and Pelvis today are reported separately. 05/02/2019. FINDINGS: Portable AP semi upright view at 0643 hours. A chronic right chest power port. Low lung volumes. Normal cardiac size and mediastinal contours. Visualized tracheal air column is within normal limits. Allowing for portable technique the lungs are clear. No pneumothorax or pleural effusion. Negative visible bowel gas. Prior cervical ACDF. No acute osseous abnormality identified. IMPRESSION: No acute cardiopulmonary abnormality. Electronically Signed   By: Genevie Ann M.D.   On: 12/07/2020 07:01      Subjective: She is feeling better.  Cough is nonproductive but overall breathing has improved.  Back pain is also better.  She did have a bowel movement this morning.  Discharge Exam: Vitals:   12/08/20 0842 12/08/20 1346 12/08/20 2050 12/09/20 0521  BP: 135/74 124/83 128/78 133/78  Pulse: 85 89 79 72  Resp:  16 16 18   Temp:  98.4 F (36.9 C) 98.6 F (37 C) (!) 97.3 F (36.3 C)  TempSrc:  Oral Oral   SpO2: 96% 97% 94% 91%  Weight:      Height:        General: Pt is alert, awake, not in acute distress Cardiovascular: RRR, S1/S2 +, no rubs, no gallops Respiratory: CTA bilaterally, no wheezing, no rhonchi Abdominal: Soft, NT, ND, bowel sounds + Extremities: no edema, no cyanosis    The results of significant diagnostics from this hospitalization (including imaging, microbiology, ancillary and laboratory) are listed below for reference.     Microbiology: Recent Results (from the past 240 hour(s))  Blood Culture (routine x 2)     Status: None (Preliminary result)   Collection Time: 12/07/20  6:32 AM   Specimen: BLOOD RIGHT FOREARM  Result Value Ref Range Status   Specimen Description BLOOD RIGHT FOREARM  Final   Special Requests   Final    BOTTLES DRAWN AEROBIC AND ANAEROBIC Blood Culture adequate volume   Culture   Final    NO GROWTH 2  DAYS Performed at [facility], [address], [place], [place] [address]    Report Status PENDING  Incomplete  Blood Culture (routine x 2)     Status: None (Preliminary result)   Collection Time: 12/07/20  6:54 AM   Specimen: BLOOD LEFT WRIST  Result Value Ref Range Status   Specimen Description BLOOD LEFT WRIST  Final   Special Requests   Final    BOTTLES DRAWN AEROBIC AND ANAEROBIC Blood Culture adequate volume   Culture   Final    NO GROWTH 2 DAYS Performed at [facility], [address], [place], [place] [address]    Report Status PENDING  Incomplete  Resp Panel by RT-PCR (Flu A&B, Covid) Nasopharyngeal Swab     Status: Abnormal   Collection Time: 12/07/20  7:55 AM   Specimen: Nasopharyngeal Swab; Nasopharyngeal(NP) swabs in vial transport medium  Result Value Ref Range Status   SARS Coronavirus 2 by RT PCR NEGATIVE NEGATIVE Final    Comment: (NOTE) SARS-CoV-2 target nucleic acids are NOT DETECTED.  The SARS-CoV-2  RNA is generally detectable in upper respiratory specimens during the acute phase of infection. The lowest concentration of SARS-CoV-2 viral copies this assay can detect is 138 copies/mL. A negative result does not preclude SARS-Cov-2 infection and should not be used as the sole basis for treatment or other patient management decisions. A negative result may occur with  improper specimen collection/handling, submission of specimen other than nasopharyngeal swab, presence of viral mutation(s) within the areas targeted by this assay, and inadequate number of viral copies(<138 copies/mL). A negative result must be combined with clinical observations, patient history, and epidemiological information. The expected result is Negative.  Fact Sheet for Patients:  EntrepreneurPulse.com.au  Fact Sheet for Healthcare Providers:  IncredibleEmployment.be  This test is no t yet approved or cleared by the [place] FDA and  has been  authorized for detection and/or diagnosis of SARS-CoV-2 by FDA under an Emergency Use Authorization (EUA). This EUA will remain  in effect (meaning this test can be used) for the duration of the COVID-19 declaration under Section 564(b)(1) of the Act, 21 U.S.C.section 360bbb-3(b)(1), unless the authorization is terminated  or revoked sooner.       Influenza A by PCR POSITIVE (A) NEGATIVE Final   Influenza B by PCR NEGATIVE NEGATIVE Final    Comment: (NOTE) The Xpert Xpress SARS-CoV-2/FLU/RSV plus assay is intended as an aid in the diagnosis of influenza from Nasopharyngeal swab specimens and should not be used as a sole basis for treatment. Nasal washings and aspirates are unacceptable for Xpert Xpress SARS-CoV-2/FLU/RSV testing.  Fact Sheet for Patients: EntrepreneurPulse.com.au  Fact Sheet for Healthcare Providers: IncredibleEmployment.be  This test is not yet approved or cleared by the [place] FDA and has been authorized for detection and/or diagnosis of SARS-CoV-2 by FDA under an Emergency Use Authorization (EUA). This EUA will remain in effect (meaning this test can be used) for the duration of the COVID-19 declaration under Section 564(b)(1) of the Act, 21 U.S.C. section 360bbb-3(b)(1), unless the authorization is terminated or revoked.  Performed at [facility], [address], [place], [place] [address]   Urine Culture     Status: None   Collection Time: 12/07/20  9:34 AM   Specimen: In/Out Cath Urine  Result Value Ref Range Status   Specimen Description   Final    IN/OUT CATH URINE Performed at [facility], [address], [place], [place] [address]    Special Requests   Final    NONE Performed at [facility], [address], [place], [place] [address]    Culture   Final    NO GROWTH Performed at [facility], [place] [address], [place], [place] [address]    Report Status 12/08/2020 FINAL  Final     Labs: BNP  (last 3 results) No results for input(s): BNP in the last 8760 hours. Basic Metabolic Panel: Recent Labs  Lab 12/07/20 0632 12/08/20 0615 12/09/20 0453  NA 136 139 138  K 3.1* 3.8 4.1  CL 105 107 105  CO2 21* 25 26  GLUCOSE 149* 103* 105*  BUN 8 8 11   CREATININE 0.71 0.49 0.55  CALCIUM 8.7* 8.3* 8.5*  MG  --  1.5* 2.5*   Liver Function Tests: Recent Labs  Lab 12/07/20 0632  AST 27  ALT 34  ALKPHOS 113  BILITOT 0.5  PROT 6.7  ALBUMIN 3.8   No results for input(s): LIPASE, AMYLASE in the last 168 hours. No results for input(s): AMMONIA in the last 168 hours.  CBC: Recent Labs  Lab 12/07/20 0632 12/08/20 0615  WBC 4.8 3.6*  NEUTROABS 2.5  --   HGB 11.8* 11.1*  HCT 35.6* 35.9*  MCV 86.2 89.5  PLT 322 288   Cardiac Enzymes: No results for input(s): CKTOTAL, CKMB, CKMBINDEX, TROPONINI in the last 168 hours. BNP: Invalid input(s): POCBNP CBG: No results for input(s): GLUCAP in the last 168 hours. D-Dimer No results for input(s): DDIMER in the last 72 hours. Hgb A1c No results for input(s): HGBA1C in the last 72 hours. Lipid Profile No results for input(s): CHOL, HDL, LDLCALC, TRIG, CHOLHDL, LDLDIRECT in the last 72 hours. Thyroid function studies No results for input(s): TSH, T4TOTAL, T3FREE, THYROIDAB in the last 72 hours.  Invalid input(s): FREET3 Anemia work up No results for input(s): VITAMINB12, FOLATE, FERRITIN, TIBC, IRON, RETICCTPCT in the last 72 hours. Urinalysis    Component Value Date/Time   COLORURINE COLORLESS (A) 12/07/2020 0934   APPEARANCEUR CLEAR 12/07/2020 0934   LABSPEC 1.002 (L) 12/07/2020 0934   PHURINE 7.0 12/07/2020 [place] 12/07/2020 0934   HGBUR NEGATIVE 12/07/2020 0934   BILIRUBINUR NEGATIVE 12/07/2020 [place] 12/07/2020 0934   PROTEINUR NEGATIVE 12/07/2020 0934   NITRITE NEGATIVE 12/07/2020 0934   LEUKOCYTESUR NEGATIVE 12/07/2020 0934   Sepsis Labs Invalid input(s): PROCALCITONIN,  WBC,   LACTICIDVEN Microbiology Recent Results (from the past 240 hour(s))  Blood Culture (routine x 2)     Status: None (Preliminary result)   Collection Time: 12/07/20  6:32 AM   Specimen: BLOOD RIGHT FOREARM  Result Value Ref Range Status   Specimen Description BLOOD RIGHT FOREARM  Final   Special Requests   Final    BOTTLES DRAWN AEROBIC AND ANAEROBIC Blood Culture adequate volume   Culture   Final    NO GROWTH 2 DAYS Performed at [facility], [address] [place], [place] [address]    Report Status PENDING  Incomplete  Blood Culture (routine x 2)     Status: None (Preliminary result)   Collection Time: 12/07/20  6:54 AM   Specimen: BLOOD LEFT WRIST  Result Value Ref Range Status   Specimen Description BLOOD LEFT WRIST  Final   Special Requests   Final    BOTTLES DRAWN AEROBIC AND ANAEROBIC Blood Culture adequate volume   Culture   Final    NO GROWTH 2 DAYS Performed at [facility], [address] [place], [place] [address]    Report Status PENDING  Incomplete  Resp Panel by RT-PCR (Flu A&B, Covid) Nasopharyngeal Swab     Status: Abnormal   Collection Time: 12/07/20  7:55 AM   Specimen: Nasopharyngeal Swab; Nasopharyngeal(NP) swabs in vial transport medium  Result Value Ref Range Status   SARS Coronavirus 2 by RT PCR NEGATIVE NEGATIVE Final    Comment: (NOTE) SARS-CoV-2 target nucleic acids are NOT DETECTED.  The SARS-CoV-2 RNA is generally detectable in upper respiratory specimens during the acute phase of infection. The lowest concentration of SARS-CoV-2 viral copies this assay can detect is 138 copies/mL. A negative result does not preclude SARS-Cov-2 infection and should not be used as the sole basis for treatment or other patient management decisions. A negative result may occur with  improper specimen collection/handling, submission of specimen other than nasopharyngeal swab, presence of viral mutation(s) within the areas targeted by this assay, and inadequate  number of viral copies(<138 copies/mL). A negative result must be combined with clinical observations, patient history, and epidemiological information. The expected result  is Negative.  Fact Sheet for Patients:  EntrepreneurPulse.com.au  Fact Sheet for Healthcare Providers:  IncredibleEmployment.be  This test is no t yet approved or cleared by the [place] FDA and  has been authorized for detection and/or diagnosis of SARS-CoV-2 by FDA under an Emergency Use Authorization (EUA). This EUA will remain  in effect (meaning this test can be used) for the duration of the COVID-19 declaration under Section 564(b)(1) of the Act, 21 U.S.C.section 360bbb-3(b)(1), unless the authorization is terminated  or revoked sooner.       Influenza A by PCR POSITIVE (A) NEGATIVE Final   Influenza B by PCR NEGATIVE NEGATIVE Final    Comment: (NOTE) The Xpert Xpress SARS-CoV-2/FLU/RSV plus assay is intended as an aid in the diagnosis of influenza from Nasopharyngeal swab specimens and should not be used as a sole basis for treatment. Nasal washings and aspirates are unacceptable for Xpert Xpress SARS-CoV-2/FLU/RSV testing.  Fact Sheet for Patients: EntrepreneurPulse.com.au  Fact Sheet for Healthcare Providers: IncredibleEmployment.be  This test is not yet approved or cleared by the [place] FDA and has been authorized for detection and/or diagnosis of SARS-CoV-2 by FDA under an Emergency Use Authorization (EUA). This EUA will remain in effect (meaning this test can be used) for the duration of the COVID-19 declaration under Section 564(b)(1) of the Act, 21 U.S.C. section 360bbb-3(b)(1), unless the authorization is terminated or revoked.  Performed at [facility], [address], [place], [place] [address]   Urine Culture     Status: None   Collection Time: 12/07/20  9:34 AM   Specimen: In/Out Cath Urine  Result  Value Ref Range Status   Specimen Description   Final    IN/OUT CATH URINE Performed at [facility], [address] [place], [place] [address]    Special Requests   Final    NONE Performed at [facility], [address], [place], [place] [address]    Culture   Final    NO GROWTH Performed at [facility], [place] [address] [place], [place] [address]    Report Status 12/08/2020 FINAL  Final     Time coordinating discharge: 85mins  SIGNED:   Kathie Dike, MD  [facility] 12/09/2020, 6:28 PM   If 7PM-7AM, please contact night-coverage [email or web address]

## 2020-12-09 NOTE — Progress Notes (Signed)
Nsg Discharge Note  Admit Date:  12/07/2020 Discharge date: 12/09/2020   Jumanah Hynson Mikels to be D/C'd Home per MD order.  AVS completed.  Copy for chart, and copy for patient signed, and dated. Patient/caregiver able to verbalize understanding. IV removed. Discharge paper work given and reviewed with patient. Patient stable upon discharge  Discharge Medication: Allergies as of 12/09/2020       Reactions   Carboplatin Other (See Comments)   "body on fire and abdominal pain"   Nickel Rash   itchy        Medication List     TAKE these medications    albuterol 108 (90 Base) MCG/ACT inhaler Commonly known as: VENTOLIN HFA Inhale 2 puffs into the lungs every 6 (six) hours as needed for wheezing or shortness of breath.   amLODipine 5 MG tablet Commonly known as: NORVASC Take 1 tablet (5 mg total) by mouth daily.   atorvastatin 20 MG tablet Commonly known as: LIPITOR Take 1 tablet by mouth once daily   EPINEPHrine 0.3 mg/0.3 mL Soaj injection Commonly known as: EPI-PEN Inject 0.3 mg into the muscle as needed for anaphylaxis.   gabapentin 300 MG capsule Commonly known as: NEURONTIN Take 1 capsule (300 mg total) by mouth 3 (three) times daily.   guaiFENesin 600 MG 12 hr tablet Commonly known as: Mucinex Take 1 tablet (600 mg total) by mouth 2 (two) times daily.   lidocaine-prilocaine cream Commonly known as: EMLA Apply to port site prior to use What changed:  how much to take when to take this reasons to take this additional instructions   oseltamivir 75 MG capsule Commonly known as: TAMIFLU Take 1 capsule (75 mg total) by mouth 2 (two) times daily.   oxyCODONE-acetaminophen 5-325 MG tablet Commonly known as: Percocet Take 1 tablet by mouth every 12 (twelve) hours as needed (pain).   PACLITAXEL IV Inject into the vein every 21 ( twenty-one) days.   prochlorperazine 10 MG tablet Commonly known as: COMPAZINE Take 1 tablet (10 mg total) by mouth every 6  (six) hours as needed (Nausea or vomiting).        Discharge Assessment: Vitals:   12/08/20 2050 12/09/20 0521  BP: 128/78 133/78  Pulse: 79 72  Resp: 16 18  Temp: 98.6 F (37 C) (!) 97.3 F (36.3 C)  SpO2: 94% 91%   Skin clean, dry and intact without evidence of skin break down, no evidence of skin tears noted. IV catheter discontinued intact. Site without signs and symptoms of complications - no redness or edema noted at insertion site, patient denies c/o pain - only slight tenderness at site.  Dressing with slight pressure applied.  D/c Instructions-Education: Discharge instructions given to patient/family with verbalized understanding. D/c education completed with patient/family including follow up instructions, medication list, d/c activities limitations if indicated, with other d/c instructions as indicated by MD - patient able to verbalize understanding, all questions fully answered. Patient instructed to return to ED, call 911, or call MD for any changes in condition.  Patient escorted via [place], and D/C home via private auto.  Zenaida Deed, RN 12/09/2020 3:07 PM

## 2020-12-12 LAB — CULTURE, BLOOD (ROUTINE X 2)
Culture: NO GROWTH
Culture: NO GROWTH
Special Requests: ADEQUATE
Special Requests: ADEQUATE

## 2020-12-17 ENCOUNTER — Ambulatory Visit (HOSPITAL_COMMUNITY)
Admission: RE | Admit: 2020-12-17 | Discharge: 2020-12-17 | Disposition: A | Discharge status: Home / Self Care | Payer: Medicaid Other | Source: Ambulatory Visit | Attending: Hematology | Admitting: Hematology

## 2020-12-17 ENCOUNTER — Inpatient Hospital Stay (HOSPITAL_COMMUNITY): Payer: Medicaid Other

## 2020-12-17 ENCOUNTER — Other Ambulatory Visit: Payer: Self-pay

## 2020-12-17 ENCOUNTER — Encounter (HOSPITAL_COMMUNITY): Payer: Self-pay

## 2020-12-17 DIAGNOSIS — C786 Secondary malignant neoplasm of retroperitoneum and peritoneum: Secondary | ICD-10-CM | POA: Diagnosis present

## 2020-12-17 DIAGNOSIS — Z5111 Encounter for antineoplastic chemotherapy: Secondary | ICD-10-CM | POA: Diagnosis present

## 2020-12-17 DIAGNOSIS — I10 Essential (primary) hypertension: Secondary | ICD-10-CM | POA: Diagnosis not present

## 2020-12-17 DIAGNOSIS — Z452 Encounter for adjustment and management of vascular access device: Secondary | ICD-10-CM | POA: Diagnosis not present

## 2020-12-17 DIAGNOSIS — C771 Secondary and unspecified malignant neoplasm of intrathoracic lymph nodes: Secondary | ICD-10-CM | POA: Diagnosis not present

## 2020-12-17 DIAGNOSIS — C541 Malignant neoplasm of endometrium: Secondary | ICD-10-CM

## 2020-12-17 DIAGNOSIS — Z79899 Other long term (current) drug therapy: Secondary | ICD-10-CM | POA: Diagnosis not present

## 2020-12-17 MED ORDER — HEPARIN SOD (PORK) LOCK FLUSH 100 UNIT/ML IV SOLN
INTRAVENOUS | Status: AC
  Administered 2020-12-17: 10:00:00 500 [IU] via INTRAVENOUS
  Filled 2020-12-17: qty 5

## 2020-12-17 MED ORDER — HEPARIN SOD (PORK) LOCK FLUSH 100 UNIT/ML IV SOLN: 500.0000 [IU] | Freq: Once | INTRAVENOUS | Status: AC

## 2020-12-17 MED ORDER — SODIUM CHLORIDE 0.9% FLUSH
10.0000 mL | INTRAVENOUS | Status: DC | PRN
  Administered 2020-12-17: 09:00:00 10 mL via INTRAVENOUS

## 2020-12-17 MED ORDER — IOHEXOL 300 MG/ML  SOLN
100.0000 mL | Freq: Once | INTRAMUSCULAR | Status: AC | PRN
  Administered 2020-12-17: 10:00:00 100 mL via INTRAVENOUS

## 2020-12-17 NOTE — Progress Notes (Signed)
Jessica [name] presented for Portacath access and flush.  Portacath located right chest wall accessed with  PH 20 needle.  Good blood return present. Portacath flushed with 76ml NS.  Pt left accessed for CT scan.  Procedure tolerated well and without incident.

## 2020-12-17 NOTE — Patient Instructions (Signed)
[facility]  Discharge Instructions: Thank you for choosing [facility] to provide your oncology and hematology care.  If you have a lab appointment with the [facility], please come in thru the Main Entrance and check in at the main information desk.  Wear comfortable clothing and clothing appropriate for easy access to any Portacath or PICC line.   We strive to give you quality time with your provider. You may need to reschedule your appointment if you arrive late (15 or more minutes).  Arriving late affects you and other patients whose appointments are after yours.  Also, if you miss three or more appointments without notifying the office, you may be dismissed from the clinic at the provider's discretion.      For prescription refill requests, have your pharmacy contact our office and allow 72 hours for refills to be completed.    Today you received the following chemotherapy and/or immunotherapy agents Pt accessed for CT scan      To help prevent nausea and vomiting after your treatment, we encourage you to take your nausea medication as directed.  BELOW ARE SYMPTOMS THAT SHOULD BE REPORTED IMMEDIATELY: *FEVER GREATER THAN 100.4 F (38 C) OR HIGHER *CHILLS OR SWEATING *NAUSEA AND VOMITING THAT IS NOT CONTROLLED WITH YOUR NAUSEA MEDICATION *UNUSUAL SHORTNESS OF BREATH *UNUSUAL BRUISING OR BLEEDING *URINARY PROBLEMS (pain or burning when urinating, or frequent urination) *BOWEL PROBLEMS (unusual diarrhea, constipation, pain near the anus) TENDERNESS IN MOUTH AND THROAT WITH OR WITHOUT PRESENCE OF ULCERS (sore throat, sores in mouth, or a toothache) UNUSUAL RASH, SWELLING OR PAIN  UNUSUAL VAGINAL DISCHARGE OR ITCHING   Items with * indicate a potential emergency and should be followed up as soon as possible or go to the Emergency Department if any problems should occur.  Please show the CHEMOTHERAPY ALERT CARD or IMMUNOTHERAPY ALERT CARD at check-in to the  Emergency Department and triage nurse.  Should you have questions after your visit or need to cancel or reschedule your appointment, please contact [facility] [phone]  and follow the prompts.  Office hours are 8:00 a.m. to 4:30 p.m. Monday - Friday. Please note that voicemails left after 4:00 p.m. may not be returned until the following business day.  We are closed weekends and major holidays. You have access to a nurse at all times for urgent questions. Please call the main number to the clinic [phone] and follow the prompts.  For any non-urgent questions, you may also contact your provider using MyChart. We now offer e-Visits for anyone 4 and older to request care online for non-urgent symptoms. For details visit [email or web address].   Also download the MyChart app! Go to the app store, search "MyChart", open the app, select [facility], and log in with your MyChart username and password.  Due to Covid, a mask is required upon entering the hospital/clinic. If you do not have a mask, one will be given to you upon arrival. For doctor visits, patients may have 1 support person aged 2 or older with them. For treatment visits, patients cannot have anyone with them due to current Covid guidelines and our immunocompromised population.

## 2020-12-19 NOTE — Progress Notes (Signed)
Jessica [name], Jessica [name] [id number]   CLINIC:  Medical Oncology/Hematology  PCP:  Noreene Larsson, NP [address]  [address] / [place] [address] [phone]   REASON FOR VISIT:  Follow-up for recurrent endometrial cancer  PRIOR THERAPY: TAH & BSO, bilateral pelvic and para-aortic lymph nodes dissections on 04/13/2018  NGS Results: not done  CURRENT THERAPY: Paclitaxel & Aloxi every 3 weeks  BRIEF ONCOLOGIC HISTORY:  Oncology History  Endometrial cancer ([facility])  03/31/2018 Initial Diagnosis   Endometrial cancer ([facility])   12/29/2018 -  Chemotherapy   Patient is on Treatment Plan : UTERINE Carboplatin AUC 6 / Paclitaxel q21d     06/06/2020 Miscellaneous   Summary of oncologic history from Dr. Delton Coombes   1.  Recurrent endometrial carcinosarcoma: -TAH, BSO, bilateral pelvic and para-aortic lymph node resections on 04/13/2018. -Pathology showed carcinosarcoma (malignant mixed mullerian tumor) is arising in an endometrial type polyp with no myometrial invasion identified.  High-grade.  0/39 lymph nodes positive.  PT1APN0, FIGO stage Ia.  MMR normal.  MSI-stable. -CTAP on 12/09/2018 for abdominal pain showed extensive peritoneal carcinomatosis and large soft tissue mass in the pelvis. -Biopsy of the omental mass on 12/28/2018 shows poorly differentiated carcinoma consistent with her prior malignancy. -Carboplatin and paclitaxel started on 12/29/2018. -CT scan on 05/02/2019 showed peritoneal implants in the low central small bowel mesentery and left pelvic sidewall have decreased in size measuring 1.5 x 1.9 cm and 1.7 x 2.2 cm.  Soft tissue nodule in the left lateral omentum measures 1 x 1.7 cm with no evidence of metastatic disease. -Continuation of chemotherapy until complete response was recommended. -CT AP on 07/04/2019 showed continued positive response to therapy with scattered peritoneal metastasis in the pelvis and left omentum decreased in  size.  No new metastatic disease. -CTAP on 10/24/2019 after 14 cycles showed substantial reduction in size of soft tissue nodules in the pelvis.  1.4 x 0.9 x 1 cm soft tissue density in the inferior serosal margin of the sigmoid colon, previously 1.7 x 1.1 x 1.7 cm.  2 small soft tissue nodules in the mesenteric adipose tissue above the urinary bladder measures 0.8 cm. -She has developed serious allergic reaction during cycle 14 with carboplatin. -I have discussed with Dr. Denman George.  Other options include adding ifosfamide to Taxol or Doxil and combination of lenvatinib with pembrolizumab.  In the lenvatinib pembrolizumab trial carcinosarcomas were not included. -Single agent paclitaxel started on 11/09/2019, dose reduced by 20% on 01/18/2020. -CTAP on 01/10/2020 with stable small peritoneal soft tissue nodules in the pelvis.  Resolution of soft tissue nodularity in the left abdominal omental fat with no new or progressive disease.     06/20/2020 Imaging   1. Stable examination status post hysterectomy without suspicious enhancing soft tissue nodularity at the vaginal cuff and no significant change in the multiple small soft tissue nodules in the pelvis likely representing treated tumor. No new or progressive findings. 2. Cholelithiasis including a large gallstone in the neck of the gallbladder but without CT evidence of acute cholecystitis. 3. Small hiatal hernia. 4. Aortic atherosclerosis.       CANCER STAGING:  Cancer Staging  No matching staging information was found for the patient.  INTERVAL HISTORY:  [name], a 60 y.o. female, returns for routine follow-up and consideration for next cycle of chemotherapy. [name] was last seen on 11/01/2020.  Due for cycle #33 of Paclitaxel & Aloxi today.   Overall,  she tells me she has been feeling pretty well. She presented to the ED on 11/18 for influenza which has since resolved. Her back pain and neuropathy and stable. She reports nausea  and diarrhea for 2 days following chemo. She reports occasional right sided abdominal pain. She denies pain after eating and vomiting.   Overall, she feels ready for next cycle of chemo today.   REVIEW OF SYSTEMS:  Review of Systems  Constitutional:  Negative for appetite change and fatigue (60%).  Respiratory:  Positive for cough and shortness of breath.   Gastrointestinal:  Positive for abdominal pain, diarrhea and nausea. Negative for vomiting.  Musculoskeletal:  Positive for back pain (stable).  Neurological:  Positive for numbness (stable neuropathy).  All other systems reviewed and are negative.  PAST MEDICAL/SURGICAL HISTORY:  Past Medical History:  Diagnosis Date   Cancer ([facility])    Phreesia 09/05/2019   Elevated blood pressure reading    Endometrial cancer ([facility])    Hyperlipidemia    Hypertension    PMB (postmenopausal bleeding)    Port-A-Cath in place 12/24/2018   Past Surgical History:  Procedure Laterality Date   ABDOMINAL HYSTERECTOMY N/A    Phreesia 09/05/2019   IR IMAGING GUIDED PORT INSERTION  12/28/2018   IR RADIOLOGIST EVAL & MGMT  06/02/2018   IR RADIOLOGIST EVAL & MGMT  06/16/2018   NECK SURGERY  2000   spinal surgery , titanium rod in place    ROBOTIC ASSISTED TOTAL HYSTERECTOMY WITH BILATERAL SALPINGO OOPHERECTOMY N/A 04/13/2018   Procedure: XI ROBOTIC [place];  Surgeon: Everitt Amber, MD;  Location: [facility];  Service: Gynecology;  Laterality: N/A;   ROBOTIC PELVIC AND PARA-AORTIC LYMPH NODE DISSECTION N/A 04/13/2018   Procedure: XI ROBOTIC PELVIC LYMPHADECTOMY AND PARA-AORTIC LYMPH NODE DISSECTION;  Surgeon: Everitt Amber, MD;  Location: [facility];  Service: Gynecology;  Laterality: N/A;    SOCIAL HISTORY:  Social History   Socioeconomic History   Marital status: Widowed    Spouse name: Not on file   Number of children: 3   Years of education: Not on file   Highest education level: Not on file   Occupational History   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  Tobacco Use   Smoking status: Never   Smokeless tobacco: Never  Vaping Use   Vaping Use: Never used  Substance and Sexual Activity   Alcohol use: Yes    Alcohol/week: 4.0 standard drinks    Types: 4 Glasses of wine per week    Comment: weekends   Drug use: Never   Sexual activity: Not Currently  Other Topics Concern   Not on file  Social History Narrative   Lives with daughter and granddaughter here in [place], moved 3 years ago      Cats: Zoe-15 years old, Eduard Clos, and Storm      Enjoys: walking when she can tolerate- spending time with granddaughter (her life)      Diet: eats all food groups   Caffeine: 1-2 cups at times, but does not tolerate it all the time   Water: 160 oz daily      Wears seat belt    Does not use phone while driving    Oceanographer at home   Fire extinguisher-no   No weapons   Social Determinants of Health   Financial Resource Strain: Not on file  Food Insecurity: Not on file  Transportation Needs: Not on file  Physical Activity:  Inactive   Days of Exercise per Week: 0 days   Minutes of Exercise per Session: 0 min  Stress: Not on file  Social Connections: Not on file  Intimate Partner Violence: Not on file    FAMILY HISTORY:  Family History  Problem Relation Age of Onset   Hypertension Father    Heart disease Father    Cancer Father        lung   Hypertension Mother    Cancer Sister        ovarian cancer    CURRENT MEDICATIONS:  Current Outpatient Medications  Medication Sig Dispense Refill   albuterol (VENTOLIN HFA) 108 (90 Base) MCG/ACT inhaler Inhale 2 puffs into the lungs every 6 (six) hours as needed for wheezing or shortness of breath. (Patient not taking: Reported on 12/20/2020) 8 g 2   amLODipine (NORVASC) 5 MG tablet Take 1 tablet (5 mg total) by mouth daily. 90 tablet 1   EPINEPHrine 0.3 mg/0.3 mL IJ SOAJ injection Inject 0.3 mg into the muscle as  needed for anaphylaxis. (Patient not taking: Reported on 12/20/2020) 1 each 0   gabapentin (NEURONTIN) 300 MG capsule Take 1 capsule (300 mg total) by mouth 3 (three) times daily. 90 capsule 6   guaiFENesin (MUCINEX) 600 MG 12 hr tablet Take 1 tablet (600 mg total) by mouth 2 (two) times daily. 60 tablet 2   lidocaine-prilocaine (EMLA) cream Apply to port site prior to use (Patient taking differently: 1 application daily as needed (apply to port site prior to chemo).) 30 g 0   oxyCODONE-acetaminophen (PERCOCET) 5-325 MG tablet Take 1 tablet by mouth every 12 (twelve) hours as needed (pain). (Patient not taking: Reported on 12/20/2020) 60 tablet 0   PACLITAXEL IV Inject into the vein every 21 ( twenty-one) days.     prochlorperazine (COMPAZINE) 10 MG tablet Take 1 tablet (10 mg total) by mouth every 6 (six) hours as needed (Nausea or vomiting). (Patient not taking: Reported on 12/20/2020) 60 tablet 3   atorvastatin (LIPITOR) 20 MG tablet Take 1 tablet by mouth once daily 90 tablet 0   No current facility-administered medications for this visit.   Facility-Administered Medications Ordered in Other Visits  Medication Dose Route Frequency Provider Last Rate Last Admin   diphenhydrAMINE (BENADRYL) 50 MG/ML injection            palonosetron (ALOXI) 0.25 MG/5ML injection             ALLERGIES:  Allergies  Allergen Reactions   Carboplatin Other (See Comments)    "body on fire and abdominal pain"   Nickel Rash    itchy    PHYSICAL EXAM:  Performance status (ECOG): 1 - Symptomatic but completely ambulatory  There were no vitals filed for this visit. Wt Readings from Last 3 Encounters:  12/20/20 235 lb 11.2 oz (106.9 kg)  12/07/20 238 lb (108 kg)  11/22/20 235 lb 6.4 oz (106.8 kg)   Physical Exam Vitals reviewed.  Constitutional:      Appearance: Normal appearance. She is obese.  Cardiovascular:     Rate and Rhythm: Normal rate and regular rhythm.     Pulses: Normal pulses.     Heart  sounds: Normal heart sounds.  Pulmonary:     Effort: Pulmonary effort is normal.     Breath sounds: Normal breath sounds.  Abdominal:     Palpations: Abdomen is soft. There is no hepatomegaly, splenomegaly or mass.     Tenderness: There is no abdominal tenderness.  Musculoskeletal:     Right lower leg: No edema.     Left lower leg: No edema.  Neurological:     General: No focal deficit present.     Mental Status: She is alert and oriented to person, place, and time.  Psychiatric:        Mood and Affect: Mood normal.        Behavior: Behavior normal.    LABORATORY DATA:  I have reviewed the labs as listed.  CBC Latest Ref Rng & Units 12/20/2020 12/08/2020 12/07/2020  WBC 4.0 - 10.5 K/uL 6.9 3.6(L) 4.8  Hemoglobin 12.0 - 15.0 g/dL 11.0(L) 11.1(L) 11.8(L)  Hematocrit 36.0 - 46.0 % 34.1(L) 35.9(L) 35.6(L)  Platelets 150 - 400 K/uL 287 288 322   CMP Latest Ref Rng & Units 12/09/2020 12/08/2020 12/07/2020  Glucose 70 - 99 mg/dL 105(H) 103(H) 149(H)  BUN 6 - 20 mg/dL _0 Creatinine 0.44 - 1.00 mg/dL 0.55 0.49 0.71  Sodium 135 - 145 mmol/L 138 139 136  Potassium 3.5 - 5.1 mmol/L 4.1 3.8 3.1(L)  Chloride 98 - 111 mmol/L 105 107 105  CO2 22 - 32 mmol/L 26 25 21(L)  Calcium 8.9 - 10.3 mg/dL 8.5(L) 8.3(L) 8.7(L)  Total Protein 6.5 - 8.1 g/dL - - 6.7  Total Bilirubin 0.3 - 1.2 mg/dL - - 0.5  Alkaline Phos 38 - 126 U/L - - 113  AST 15 - 41 U/L - - 27  ALT 0 - 44 U/L - - 34    DIAGNOSTIC IMAGING:  I have independently reviewed the scans and discussed with the patient. CT Abdomen Pelvis W Contrast  Result Date: 12/17/2020 CLINICAL DATA:  Ovarian cancer.  Restaging. EXAM: CT ABDOMEN AND PELVIS WITH CONTRAST TECHNIQUE: Multidetector CT imaging of the abdomen and pelvis was performed using the standard protocol following bolus administration of intravenous contrast. CONTRAST:  176m OMNIPAQUE IOHEXOL 300 MG/ML  SOLN COMPARISON:  09/17/2020 FINDINGS: Lower chest: Small hiatal hernia.   Otherwise unremarkable. Hepatobiliary: No suspicious focal abnormality within the liver parenchyma. Mural calcification or calcified gallstone again noted in the neck of the gallbladder. No intrahepatic or extrahepatic biliary dilation. Pancreas: No focal mass lesion. No dilatation of the main duct. No intraparenchymal cyst. No peripancreatic edema. Spleen: No splenomegaly. No focal mass lesion. Adrenals/Urinary Tract: No adrenal nodule or mass. Kidneys unremarkable. No evidence for hydroureter. The urinary bladder appears normal for the degree of distention. Stomach/Bowel: Small to moderate hiatal hernia. Stomach otherwise unremarkable. Duodenum is normally positioned as is the ligament of Treitz. No small bowel wall thickening. No small bowel dilatation. The terminal ileum is normal. The appendix is normal. No gross colonic mass. No colonic wall thickening. Vascular/Lymphatic: No abdominal aortic aneurysm. No abdominal lymphadenopathy No pelvic sidewall lymphadenopathy. Reproductive: Uterus surgically absent.  There is no adnexal mass. Other: No intraperitoneal free fluid. Previously described tiny soft tissue nodules in the pelvis are stable, including dominant 10 mm nodule along the left pelvic sidewall on 71/2. Adjacent central pelvic nodules on images 72 in 74 of series 2 are also stable. Musculoskeletal: No worrisome lytic or sclerotic osseous abnormality. IMPRESSION: 1. Stable exam. Tiny previously described pelvic soft tissue nodules are unchanged. No new or progressive findings on today's study. 2. Small hiatal hernia. 3. Mural calcification or calcified gallstone again noted in the neck of the gallbladder. 4. Small to moderate hiatal hernia. Electronically Signed   By: EMisty StanleyM.D.   On: 12/17/2020 10:23   [facility]  [facility]  Result Date: 12/07/2020 CLINICAL DATA:  60 year old female with possible sepsis. History of endometrial carcinoma. EXAM: PORTABLE CHEST 1 VIEW COMPARISON:  CT Chest,  Abdomen, and Pelvis today are reported separately. 05/02/2019. FINDINGS: Portable AP semi upright view at 0643 hours. A chronic right chest power port. Low lung volumes. Normal cardiac size and mediastinal contours. Visualized tracheal air column is within normal limits. Allowing for portable technique the lungs are clear. No pneumothorax or pleural effusion. Negative visible bowel gas. Prior cervical ACDF. No acute osseous abnormality identified. IMPRESSION: No acute cardiopulmonary abnormality. Electronically Signed   By: Genevie Ann M.D.   On: 12/07/2020 07:01     ASSESSMENT:  1.  Recurrent endometrial carcinosarcoma: -TAH, BSO, bilateral pelvic and para-aortic lymph node resections on 04/13/2018. -Pathology showed carcinosarcoma (malignant mixed mullerian tumor) is arising in an endometrial type polyp with no myometrial invasion identified.  High-grade.  0/39 lymph nodes positive.  PT1APN0, FIGO stage Ia.  MMR normal.  MSI-stable. -CTAP on 12/09/2018 for abdominal pain showed extensive peritoneal carcinomatosis and large soft tissue mass in the pelvis. -Biopsy of the omental mass on 12/28/2018 shows poorly differentiated carcinoma consistent with her prior malignancy. -Carboplatin and paclitaxel started on 12/29/2018. -CT scan on 05/02/2019 showed peritoneal implants in the low central small bowel mesentery and left pelvic sidewall have decreased in size measuring 1.5 x 1.9 cm and 1.7 x 2.2 cm.  Soft tissue nodule in the left lateral omentum measures 1 x 1.7 cm with no evidence of metastatic disease. -Continuation of chemotherapy until complete response was recommended. -CT AP on 07/04/2019 showed continued positive response to therapy with scattered peritoneal metastasis in the pelvis and left omentum decreased in size.  No new metastatic disease. -CTAP on 10/24/2019 after 14 cycles showed substantial reduction in size of soft tissue nodules in the pelvis.  1.4 x 0.9 x 1 cm soft tissue density in the inferior  serosal margin of the sigmoid colon, previously 1.7 x 1.1 x 1.7 cm.  2 small soft tissue nodules in the mesenteric adipose tissue above the urinary bladder measures 0.8 cm. -She has developed serious allergic reaction during cycle 14 with carboplatin. -I have discussed with Dr. Denman George.  Other options include adding ifosfamide to Taxol or Doxil and combination of lenvatinib with pembrolizumab.  In the lenvatinib pembrolizumab trial carcinosarcomas were not included. -Single agent paclitaxel started on 11/09/2019, dose reduced by 20% on 01/18/2020. -CTAP on 01/10/2020 with stable small peritoneal soft tissue nodules in the pelvis.  Resolution of soft tissue nodularity in the left abdominal omental fat with no new or progressive disease.   2.  Breast abnormality: -Mammogram on 05/02/2019 shows BI-RADS Category 0.  She reportedly took Covid shot 1 to 2 weeks prior to the mammogram.  Further scans rescheduled on 06/21/2019.   3.  Peripheral neuropathy: -She reported numbness and occasional pins-and-needles sensation in the bilateral toes, right more than left.  This has started about a week ago.   PLAN:  1.  Recurrent endometrial carcinosarcoma: - She was hospitalized from 12/07/2020 through 12/09/2020 with flu. - Reviewed CT CAP from 12/17/2020 which showed tiny previously described pelvic soft tissue nodules, maximal diameter 1 cm unchanged.  No new progressive findings.  Small hiatal hernia.  Calcified gallstone again noted in the neck of the bladder. - Last CA125 was within normal limits.  CBC was grossly normal.  Liver function panel and creatinine were normal. - She will receive magnesium 1 g IV today.  Proceed with  next cycle.  She will come back for her next treatment in 4 weeks.  RTC 7 weeks for follow-up with me.   2.  Low back pain/right-sided abdominal pain: - She has on and off bilateral hip pains. - Continue oxycodone 5 mg 3-4 times per week.   3.  Hypertension: - Continue Norvasc 5 mg  daily.  Blood pressure is fairly controlled.   4.  Neuropathy: - Bilateral feet numbness and tingling, right more than left.  Continue gabapentin 600 mg at bedtime.   5.  Abnormal mammogram: - Abnormal mammogram on 05/15/2020, status postbiopsy which was benign. - Continue yearly mammograms.   Orders placed this encounter:  No orders of the defined types were placed in this encounter.    [name], MD [place] [phone]   I, Thana Ates, am acting as a scribe for Dr. [name].  I, [name] MD, have reviewed the above documentation for accuracy and completeness, and I agree with the above.

## 2020-12-20 ENCOUNTER — Other Ambulatory Visit (HOSPITAL_COMMUNITY): Payer: Medicaid Other

## 2020-12-20 ENCOUNTER — Other Ambulatory Visit: Payer: Self-pay

## 2020-12-20 ENCOUNTER — Encounter (HOSPITAL_COMMUNITY): Payer: Self-pay | Admitting: Hematology

## 2020-12-20 ENCOUNTER — Ambulatory Visit (HOSPITAL_COMMUNITY): Payer: Medicaid Other | Admitting: Hematology

## 2020-12-20 ENCOUNTER — Ambulatory Visit (HOSPITAL_COMMUNITY): Payer: Medicaid Other

## 2020-12-20 ENCOUNTER — Inpatient Hospital Stay (HOSPITAL_BASED_OUTPATIENT_CLINIC_OR_DEPARTMENT_OTHER): Payer: Medicaid Other | Admitting: Hematology

## 2020-12-20 ENCOUNTER — Inpatient Hospital Stay (HOSPITAL_COMMUNITY): Payer: Medicaid Other | Attending: Hematology

## 2020-12-20 ENCOUNTER — Inpatient Hospital Stay (HOSPITAL_COMMUNITY): Payer: Medicaid Other

## 2020-12-20 VITALS — BP 113/64 | HR 72 | Temp 97.9°F | Resp 18

## 2020-12-20 DIAGNOSIS — Z79899 Other long term (current) drug therapy: Secondary | ICD-10-CM | POA: Insufficient documentation

## 2020-12-20 DIAGNOSIS — Z5111 Encounter for antineoplastic chemotherapy: Secondary | ICD-10-CM | POA: Insufficient documentation

## 2020-12-20 DIAGNOSIS — M25551 Pain in right hip: Secondary | ICD-10-CM | POA: Diagnosis not present

## 2020-12-20 DIAGNOSIS — Z9079 Acquired absence of other genital organ(s): Secondary | ICD-10-CM | POA: Insufficient documentation

## 2020-12-20 DIAGNOSIS — C541 Malignant neoplasm of endometrium: Secondary | ICD-10-CM

## 2020-12-20 DIAGNOSIS — G629 Polyneuropathy, unspecified: Secondary | ICD-10-CM | POA: Insufficient documentation

## 2020-12-20 DIAGNOSIS — M25552 Pain in left hip: Secondary | ICD-10-CM | POA: Diagnosis not present

## 2020-12-20 DIAGNOSIS — Z90722 Acquired absence of ovaries, bilateral: Secondary | ICD-10-CM | POA: Diagnosis not present

## 2020-12-20 DIAGNOSIS — R109 Unspecified abdominal pain: Secondary | ICD-10-CM | POA: Insufficient documentation

## 2020-12-20 DIAGNOSIS — Z90710 Acquired absence of both cervix and uterus: Secondary | ICD-10-CM | POA: Insufficient documentation

## 2020-12-20 DIAGNOSIS — R928 Other abnormal and inconclusive findings on diagnostic imaging of breast: Secondary | ICD-10-CM | POA: Insufficient documentation

## 2020-12-20 DIAGNOSIS — C786 Secondary malignant neoplasm of retroperitoneum and peritoneum: Secondary | ICD-10-CM | POA: Insufficient documentation

## 2020-12-20 DIAGNOSIS — I10 Essential (primary) hypertension: Secondary | ICD-10-CM | POA: Insufficient documentation

## 2020-12-20 DIAGNOSIS — M545 Low back pain, unspecified: Secondary | ICD-10-CM | POA: Insufficient documentation

## 2020-12-20 DIAGNOSIS — Z95828 Presence of other vascular implants and grafts: Secondary | ICD-10-CM

## 2020-12-20 LAB — CBC WITH DIFFERENTIAL/PLATELET
Abs Immature Granulocytes: 0.02 10*3/uL (ref 0.00–0.07)
Basophils Absolute: 0.1 10*3/uL (ref 0.0–0.1)
Basophils Relative: 1 %
Eosinophils Absolute: 0.2 10*3/uL (ref 0.0–0.5)
Eosinophils Relative: 3 %
HCT: 34.1 % — ABNORMAL LOW (ref 36.0–46.0)
Hemoglobin: 11 g/dL — ABNORMAL LOW (ref 12.0–15.0)
Immature Granulocytes: 0 %
Lymphocytes Relative: 40 %
Lymphs Abs: 2.8 10*3/uL (ref 0.7–4.0)
MCH: 28.1 pg (ref 26.0–34.0)
MCHC: 32.3 g/dL (ref 30.0–36.0)
MCV: 87 fL (ref 80.0–100.0)
Monocytes Absolute: 0.7 10*3/uL (ref 0.1–1.0)
Monocytes Relative: 11 %
Neutro Abs: 3.2 10*3/uL (ref 1.7–7.7)
Neutrophils Relative %: 45 %
Platelets: 287 10*3/uL (ref 150–400)
RBC: 3.92 MIL/uL (ref 3.87–5.11)
RDW: 15.9 % — ABNORMAL HIGH (ref 11.5–15.5)
WBC: 6.9 10*3/uL (ref 4.0–10.5)
nRBC: 0 % (ref 0.0–0.2)

## 2020-12-20 LAB — COMPREHENSIVE METABOLIC PANEL WITH GFR
ALT: 21 U/L (ref 0–44)
AST: 21 U/L (ref 15–41)
Albumin: 3.8 g/dL (ref 3.5–5.0)
Alkaline Phosphatase: 86 U/L (ref 38–126)
Anion gap: 9 (ref 5–15)
BUN: 18 mg/dL (ref 6–20)
CO2: 24 mmol/L (ref 22–32)
Calcium: 8.9 mg/dL (ref 8.9–10.3)
Chloride: 105 mmol/L (ref 98–111)
Creatinine, Ser: 0.63 mg/dL (ref 0.44–1.00)
GFR, Estimated: >60 mL/min
Glucose, Bld: 121 mg/dL — ABNORMAL HIGH (ref 70–99)
Potassium: 3.5 mmol/L (ref 3.5–5.1)
Sodium: 138 mmol/L (ref 135–145)
Total Bilirubin: 0.5 mg/dL (ref 0.3–1.2)
Total Protein: 6.5 g/dL (ref 6.5–8.1)

## 2020-12-20 LAB — MAGNESIUM: Magnesium: 1.6 mg/dL — ABNORMAL LOW (ref 1.7–2.4)

## 2020-12-20 MED ORDER — SODIUM CHLORIDE 0.9 % IV SOLN
10.0000 mg | Freq: Once | INTRAVENOUS | Status: AC
  Administered 2020-12-20: 10 mg via INTRAVENOUS
  Filled 2020-12-20: qty 10

## 2020-12-20 MED ORDER — FAMOTIDINE IN NACL 20-0.9 MG/50ML-% IV SOLN
20.0000 mg | Freq: Once | INTRAVENOUS | Status: AC
  Administered 2020-12-20: 20 mg via INTRAVENOUS
  Filled 2020-12-20: qty 50

## 2020-12-20 MED ORDER — SODIUM CHLORIDE 0.9 % IV SOLN: Freq: Once | INTRAVENOUS | Status: AC

## 2020-12-20 MED ORDER — SODIUM CHLORIDE 0.9% FLUSH
10.0000 mL | INTRAVENOUS | Status: DC | PRN
  Administered 2020-12-20: 10 mL

## 2020-12-20 MED ORDER — FAMOTIDINE 20 MG IN NS 100 ML IVPB: 20.0000 mg | Freq: Once | INTRAVENOUS | Status: DC

## 2020-12-20 MED ORDER — MAGNESIUM SULFATE IN D5W 1-5 GM/100ML-% IV SOLN
1.0000 g | Freq: Once | INTRAVENOUS | Status: AC
  Administered 2020-12-20: 1 g via INTRAVENOUS
  Filled 2020-12-20: qty 100

## 2020-12-20 MED ORDER — PALONOSETRON HCL INJECTION 0.25 MG/5ML
0.2500 mg | Freq: Once | INTRAVENOUS | Status: AC
  Administered 2020-12-20: 0.25 mg via INTRAVENOUS
  Filled 2020-12-20: qty 5

## 2020-12-20 MED ORDER — HEPARIN SOD (PORK) LOCK FLUSH 100 UNIT/ML IV SOLN
500.0000 [IU] | Freq: Once | INTRAVENOUS | Status: AC | PRN
  Administered 2020-12-20: 500 [IU]

## 2020-12-20 MED ORDER — DIPHENHYDRAMINE HCL 50 MG/ML IJ SOLN
25.0000 mg | Freq: Once | INTRAMUSCULAR | Status: AC
  Administered 2020-12-20: 25 mg via INTRAVENOUS
  Filled 2020-12-20: qty 1

## 2020-12-20 MED ORDER — SODIUM CHLORIDE 0.9 % IV SOLN
140.0000 mg/m2 | Freq: Once | INTRAVENOUS | Status: AC
  Administered 2020-12-20: 306 mg via INTRAVENOUS
  Filled 2020-12-20: qty 51

## 2020-12-20 NOTE — Progress Notes (Signed)
Orders received to give patient magnesium sulfate 1 gm IVPB x 1 dose today.  Order entered.  T.O. Dr Rhys Martini, PharmD

## 2020-12-20 NOTE — Patient Instructions (Addendum)
[place] at [facility] Discharge Instructions   You were seen and examined today by Dr. Delton Coombes. We will proceed with treatment today. We will postpone your next tx until 01/17/21. Return as scheduled for lab work and tx.    Thank you for choosing [place] at [facility] to provide your oncology and hematology care.  To afford each patient quality time with our provider, please arrive at least 15 minutes before your scheduled appointment time.   If you have a lab appointment with the [facility] please come in thru the Main Entrance and check in at the main information desk.  You need to re-schedule your appointment should you arrive 10 or more minutes late.  We strive to give you quality time with our providers, and arriving late affects you and other patients whose appointments are after yours.  Also, if you no show three or more times for appointments you may be dismissed from the clinic at the providers discretion.     Again, thank you for choosing [facility].  Our hope is that these requests will decrease the amount of time that you wait before being seen by our physicians.       _____________________________________________________________  Should you have questions after your visit to [facility], please contact our office at [phone] and follow the prompts.  Our office hours are 8:00 a.m. and 4:30 p.m. Monday - Friday.  Please note that voicemails left after 4:00 p.m. may not be returned until the following business day.  We are closed weekends and major holidays.  You do have access to a nurse 24-7, just call the main number to the clinic [phone] and do not press any options, hold on the line and a nurse will answer the phone.    For prescription refill requests, have your pharmacy contact our office and allow 72 hours.    Due to Covid, you will need to wear a mask upon entering the hospital.  If you do not have a mask, a mask will be given to you at the Main Entrance upon arrival. For doctor visits, patients may have 1 support person age 51 or older with them. For treatment visits, patients can not have anyone with them due to social distancing guidelines and our immunocompromised population.

## 2020-12-20 NOTE — Progress Notes (Signed)
Patient has been examined, vital signs and labs have been reviewed by Dr. Katragadda. ANC, Creatinine, LFTs, hemoglobin, and platelets are within treatment parameters per Dr. Katragadda. Patient may proceed with treatment per M.D.   

## 2020-12-20 NOTE — Progress Notes (Signed)
Pt presents today treatment per provider's order. Vital signs and labs WNL for treatment today. Magnesium was 1.6 per Dr.K pt to receive 1 gram IV of Magnesium over 1 x 1 dose.Okay for treatment per Dr.K  Taxol and 1 gram IV of Magnesium given today per MD orders. Tolerated infusion without adverse affects. Vital signs stable. No complaints at this time. Discharged from clinic ambulatory in stable condition. Alert and oriented x 3. F/U with [facility] as scheduled.

## 2020-12-20 NOTE — Progress Notes (Signed)
Patients port flushed without difficulty.  Good blood return noted with no bruising or swelling noted at site.  Patient remains accessed for chemotherapy treatment. Patient is in stable condition.  °

## 2020-12-20 NOTE — Patient Instructions (Signed)
[facility]  Discharge Instructions: Thank you for choosing [facility] to provide your oncology and hematology care.  If you have a lab appointment with the [facility], please come in thru the Main Entrance and check in at the main information desk.  Wear comfortable clothing and clothing appropriate for easy access to any Portacath or PICC line.   We strive to give you quality time with your provider. You may need to reschedule your appointment if you arrive late (15 or more minutes).  Arriving late affects you and other patients whose appointments are after yours.  Also, if you miss three or more appointments without notifying the office, you may be dismissed from the clinic at the provider's discretion.      For prescription refill requests, have your pharmacy contact our office and allow 72 hours for refills to be completed.    Today you received the following chemotherapy and/or immunotherapy agents Taxol and 1 g of Magnesium.   To help prevent nausea and vomiting after your treatment, we encourage you to take your nausea medication as directed.  BELOW ARE SYMPTOMS THAT SHOULD BE REPORTED IMMEDIATELY: *FEVER GREATER THAN 100.4 F (38 C) OR HIGHER *CHILLS OR SWEATING *NAUSEA AND VOMITING THAT IS NOT CONTROLLED WITH YOUR NAUSEA MEDICATION *UNUSUAL SHORTNESS OF BREATH *UNUSUAL BRUISING OR BLEEDING *URINARY PROBLEMS (pain or burning when urinating, or frequent urination) *BOWEL PROBLEMS (unusual diarrhea, constipation, pain near the anus) TENDERNESS IN MOUTH AND THROAT WITH OR WITHOUT PRESENCE OF ULCERS (sore throat, sores in mouth, or a toothache) UNUSUAL RASH, SWELLING OR PAIN  UNUSUAL VAGINAL DISCHARGE OR ITCHING   Items with * indicate a potential emergency and should be followed up as soon as possible or go to the Emergency Department if any problems should occur.  Please show the CHEMOTHERAPY ALERT CARD or IMMUNOTHERAPY ALERT CARD at check-in to the  Emergency Department and triage nurse.  Should you have questions after your visit or need to cancel or reschedule your appointment, please contact [facility] [phone]  and follow the prompts.  Office hours are 8:00 a.m. to 4:30 p.m. Monday - Friday. Please note that voicemails left after 4:00 p.m. may not be returned until the following business day.  We are closed weekends and major holidays. You have access to a nurse at all times for urgent questions. Please call the main number to the clinic [phone] and follow the prompts.  For any non-urgent questions, you may also contact your provider using MyChart. We now offer e-Visits for anyone 31 and older to request care online for non-urgent symptoms. For details visit [email or web address].   Also download the MyChart app! Go to the app store, search "MyChart", open the app, select [facility], and log in with your MyChart username and password.  Due to Covid, a mask is required upon entering the hospital/clinic. If you do not have a mask, one will be given to you upon arrival. For doctor visits, patients may have 1 support person aged 13 or older with them. For treatment visits, patients cannot have anyone with them due to current Covid guidelines and our immunocompromised population.

## 2020-12-21 ENCOUNTER — Other Ambulatory Visit (HOSPITAL_COMMUNITY): Payer: Self-pay | Admitting: *Deleted

## 2020-12-21 DIAGNOSIS — C541 Malignant neoplasm of endometrium: Secondary | ICD-10-CM

## 2020-12-26 ENCOUNTER — Ambulatory Visit: Payer: Medicaid Other | Admitting: Nurse Practitioner

## 2020-12-26 ENCOUNTER — Encounter: Payer: Self-pay | Admitting: Nurse Practitioner

## 2020-12-26 ENCOUNTER — Other Ambulatory Visit: Payer: Self-pay

## 2020-12-26 VITALS — BP 142/81 | HR 77 | Resp 16 | Ht 64.0 in | Wt 236.0 lb

## 2020-12-26 DIAGNOSIS — F419 Anxiety disorder, unspecified: Secondary | ICD-10-CM

## 2020-12-26 DIAGNOSIS — I10 Essential (primary) hypertension: Secondary | ICD-10-CM | POA: Diagnosis not present

## 2020-12-26 DIAGNOSIS — F321 Major depressive disorder, single episode, moderate: Secondary | ICD-10-CM | POA: Diagnosis not present

## 2020-12-26 DIAGNOSIS — E785 Hyperlipidemia, unspecified: Secondary | ICD-10-CM

## 2020-12-26 MED ORDER — SERTRALINE HCL 50 MG PO TABS: 50.0000 mg | ORAL_TABLET | Freq: Every day | ORAL | 3 refills | Status: DC

## 2020-12-26 MED ORDER — AMLODIPINE BESYLATE 5 MG PO TABS: 5.0000 mg | ORAL_TABLET | Freq: Every day | ORAL | 1 refills | Status: DC

## 2020-12-26 MED ORDER — ATORVASTATIN CALCIUM 20 MG PO TABS: 20.0000 mg | ORAL_TABLET | Freq: Every day | ORAL | 1 refills | Status: DC

## 2020-12-26 NOTE — Assessment & Plan Note (Signed)
-  Rx. Sertraline -PHQ-9 and GAD-7 both = 14 today

## 2020-12-26 NOTE — Assessment & Plan Note (Signed)
Lab Results  Component Value Date   CHOL 202 (H) 03/04/2019   HDL 75 03/04/2019   LDLCALC 115 (H) 03/04/2019   TRIG 62 03/04/2019   CHOLHDL 2.7 03/04/2019  -LDL slightly elevated, but no change in meds d/t

## 2020-12-26 NOTE — Progress Notes (Signed)
Acute Office Visit  Subjective:    Patient ID: Jessica [name], female    DOB: [birth year], 60 y.o.   MRN: [id number]  Chief Complaint  Patient presents with   Hospitalization Follow-up    Hospitalized with flu and sepsis    Depression    Has been freaking out about everything and has such bad anxiety since having sepsis     Depression        Associated symptoms include no suicidal ideas. Patient is in today for follow-up for HTN and HLD.  She is followed by oncology for endometrial cancer and has been taking paclitaxel which has likely been causing right lateral leg pain.  She has been feeling anxiety and depression that has progressed since starting chemo, and she feels like this go much worse after having sepsis.    Past Medical History:  Diagnosis Date   Cancer ([facility])    [employer] 09/05/2019   Elevated blood pressure reading    Endometrial cancer (HCC)    Hyperlipidemia    Hypertension    PMB (postmenopausal bleeding)    Port-A-Cath in place 12/24/2018    Past Surgical History:  Procedure Laterality Date   ABDOMINAL HYSTERECTOMY N/A    [employer] 09/05/2019   IR IMAGING GUIDED PORT INSERTION  12/28/2018   IR RADIOLOGIST EVAL & MGMT  06/02/2018   IR RADIOLOGIST EVAL & MGMT  06/16/2018   NECK SURGERY  2000   spinal surgery , titanium rod in place    ROBOTIC ASSISTED TOTAL HYSTERECTOMY WITH BILATERAL SALPINGO OOPHERECTOMY N/A 04/13/2018   Procedure: XI ROBOTIC [place];  Surgeon: Everitt Amber, MD;  Location: [facility];  Service: Gynecology;  Laterality: N/A;   ROBOTIC PELVIC AND PARA-AORTIC LYMPH NODE DISSECTION N/A 04/13/2018   Procedure: XI ROBOTIC PELVIC LYMPHADECTOMY AND PARA-AORTIC LYMPH NODE DISSECTION;  Surgeon: Everitt Amber, MD;  Location: [facility];  Service: Gynecology;  Laterality: N/A;    Family History  Problem Relation Age of Onset   Hypertension Father    Heart disease Father    Cancer Father        lung    Hypertension Mother    Cancer Sister        ovarian cancer    Social History   Socioeconomic History   Marital status: Widowed    Spouse name: Not on file   Number of children: 3   Years of education: Not on file   Highest education level: Not on file  Occupational History   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  Tobacco Use   Smoking status: Never   Smokeless tobacco: Never  Vaping Use   Vaping Use: Never used  Substance and Sexual Activity   Alcohol use: Yes    Alcohol/week: 4.0 standard drinks    Types: 4 Glasses of wine per week    Comment: weekends   Drug use: Never   Sexual activity: Not Currently  Other Topics Concern   Not on file  Social History Narrative   Lives with daughter and granddaughter here in [place], moved 3 years ago      Cats: Zoe-30 years old, Eduard Clos, and Horald Pollen      Enjoys: walking when she can tolerate- spending time with granddaughter (her life)      Diet: eats all food groups   Caffeine: 1-2 cups at times, but does not tolerate it all the time   Water: 160 oz daily  Wears seat belt    Does not use phone while driving    Smoke detectors at home   Fire extinguisher-no   No weapons   Social Determinants of Health   Financial Resource Strain: Not on file  Food Insecurity: Not on file  Transportation Needs: Not on file  Physical Activity: Inactive   Days of Exercise per Week: 0 days   Minutes of Exercise per Session: 0 min  Stress: Not on file  Social Connections: Not on file  Intimate Partner Violence: Not on file    Outpatient Medications Prior to Visit  Medication Sig Dispense Refill   albuterol (VENTOLIN HFA) 108 (90 Base) MCG/ACT inhaler Inhale 2 puffs into the lungs every 6 (six) hours as needed for wheezing or shortness of breath. 8 g 2   amLODipine (NORVASC) 5 MG tablet Take 1 tablet (5 mg total) by mouth daily. 90 tablet 1   atorvastatin (LIPITOR) 20 MG tablet Take 1 tablet by mouth once daily 90 tablet 0    EPINEPHrine 0.3 mg/0.3 mL IJ SOAJ injection Inject 0.3 mg into the muscle as needed for anaphylaxis. 1 each 0   gabapentin (NEURONTIN) 300 MG capsule Take 1 capsule (300 mg total) by mouth 3 (three) times daily. 90 capsule 6   lidocaine-prilocaine (EMLA) cream Apply to port site prior to use (Patient taking differently: 1 application daily as needed (apply to port site prior to chemo).) 30 g 0   oxyCODONE-acetaminophen (PERCOCET) 5-325 MG tablet Take 1 tablet by mouth every 12 (twelve) hours as needed (pain). 60 tablet 0   PACLITAXEL IV Inject into the vein every 21 ( twenty-one) days.     prochlorperazine (COMPAZINE) 10 MG tablet Take 1 tablet (10 mg total) by mouth every 6 (six) hours as needed (Nausea or vomiting). 60 tablet 3   guaiFENesin (MUCINEX) 600 MG 12 hr tablet Take 1 tablet (600 mg total) by mouth 2 (two) times daily. 60 tablet 2   Facility-Administered Medications Prior to Visit  Medication Dose Route Frequency Provider Last Rate Last Admin   diphenhydrAMINE (BENADRYL) 50 MG/ML injection            palonosetron (ALOXI) 0.25 MG/5ML injection             Allergies  Allergen Reactions   Carboplatin Other (See Comments)    "body on fire and abdominal pain"   Nickel Rash    itchy    Review of Systems  Constitutional: Negative.   Respiratory: Negative.    Cardiovascular: Negative.   Psychiatric/Behavioral:  Positive for depression and dysphoric mood. Negative for suicidal ideas. The patient is nervous/anxious.       Objective:    Physical Exam Constitutional:      Appearance: Normal appearance. She is obese.  Cardiovascular:     Rate and Rhythm: Normal rate and regular rhythm.     Pulses: Normal pulses.     Heart sounds: Normal heart sounds.  Pulmonary:     Effort: Pulmonary effort is normal.     Breath sounds: Normal breath sounds.  Neurological:     Mental Status: She is alert.  Psychiatric:     Comments: Tearful; anxious affect    BP (!) 142/81   Pulse 77    Resp 16   Ht 5\' 4"  (1.626 m)   Wt 236 lb (107 kg)   SpO2 95%   BMI 40.51 kg/m  Wt Readings from Last 3 Encounters:  12/26/20 236 lb (107 kg)  12/20/20 235  lb 11.2 oz (106.9 kg)  12/07/20 238 lb (108 kg)    Health Maintenance Due  Topic Date Due   Pneumococcal Vaccine 5-69 Years old (1 - PCV) Never done   Zoster Vaccines- Shingrix (1 of 2) Never done   COLONOSCOPY (Pts 45-34yrs Insurance coverage will need to be confirmed)  Never done   COVID-19 Vaccine (3 - Moderna risk series) 06/07/2019    There are no preventive care reminders to display for this patient.   No results found for: TSH Lab Results  Component Value Date   WBC 6.9 12/20/2020   HGB 11.0 (L) 12/20/2020   HCT 34.1 (L) 12/20/2020   MCV 87.0 12/20/2020   PLT 287 12/20/2020   Lab Results  Component Value Date   NA 138 12/20/2020   K 3.5 12/20/2020   CO2 24 12/20/2020   GLUCOSE 121 (H) 12/20/2020   BUN 18 12/20/2020   CREATININE 0.63 12/20/2020   BILITOT 0.5 12/20/2020   ALKPHOS 86 12/20/2020   AST 21 12/20/2020   ALT 21 12/20/2020   PROT 6.5 12/20/2020   ALBUMIN 3.8 12/20/2020   CALCIUM 8.9 12/20/2020   ANIONGAP 9 12/20/2020   Lab Results  Component Value Date   CHOL 202 (H) 03/04/2019   Lab Results  Component Value Date   HDL 75 03/04/2019   Lab Results  Component Value Date   LDLCALC 115 (H) 03/04/2019   Lab Results  Component Value Date   TRIG 62 03/04/2019   Lab Results  Component Value Date   CHOLHDL 2.7 03/04/2019   No results found for: HGBA1C     Assessment & Plan:   Problem List Items Addressed This Visit       Cardiovascular and Mediastinum   Essential hypertension    BP Readings from Last 3 Encounters:  12/20/20 113/64  12/20/20 122/64  12/17/20 (!) 144/84  -BP well controlled -if BP gets lower, would consider cutting amlodipine in half        Other   Hyperlipidemia    Lab Results  Component Value Date   CHOL 202 (H) 03/04/2019   HDL 75 03/04/2019    LDLCALC 115 (H) 03/04/2019   TRIG 62 03/04/2019   CHOLHDL 2.7 03/04/2019  -LDL slightly elevated, but no change in meds d/t        Depression, major, single episode, moderate (HCC)    -Rx. Sertraline -PHQ-9 and GAD-7 both = 14 today       Relevant Medications   sertraline (ZOLOFT) 50 MG tablet   Anxiety - Primary    -referral to psych -Rx. Sertraline -med check in 1 month      Relevant Medications   sertraline (ZOLOFT) 50 MG tablet     Meds ordered this encounter  Medications   sertraline (ZOLOFT) 50 MG tablet    Sig: Take 1 tablet (50 mg total) by mouth daily.    Dispense:  30 tablet    Refill:  Lorenzo, NP

## 2020-12-26 NOTE — Assessment & Plan Note (Signed)
BP Readings from Last 3 Encounters:  12/20/20 113/64  12/20/20 122/64  12/17/20 (!) 144/84   -BP well controlled -if BP gets lower, would consider cutting amlodipine in half

## 2020-12-26 NOTE — Assessment & Plan Note (Signed)
-  referral to psych -Rx. Sertraline -med check in 1 month

## 2021-01-17 ENCOUNTER — Other Ambulatory Visit (HOSPITAL_COMMUNITY): Payer: Medicaid Other

## 2021-01-17 ENCOUNTER — Ambulatory Visit (HOSPITAL_COMMUNITY): Payer: Medicaid Other

## 2021-01-24 ENCOUNTER — Inpatient Hospital Stay (HOSPITAL_COMMUNITY): Payer: Medicaid Other

## 2021-01-24 ENCOUNTER — Inpatient Hospital Stay (HOSPITAL_COMMUNITY): Payer: Medicaid Other | Attending: Hematology

## 2021-01-24 VITALS — BP 136/81 | HR 73 | Temp 97.9°F | Resp 18

## 2021-01-24 DIAGNOSIS — I10 Essential (primary) hypertension: Secondary | ICD-10-CM | POA: Insufficient documentation

## 2021-01-24 DIAGNOSIS — C786 Secondary malignant neoplasm of retroperitoneum and peritoneum: Secondary | ICD-10-CM | POA: Insufficient documentation

## 2021-01-24 DIAGNOSIS — G629 Polyneuropathy, unspecified: Secondary | ICD-10-CM | POA: Insufficient documentation

## 2021-01-24 DIAGNOSIS — Z9079 Acquired absence of other genital organ(s): Secondary | ICD-10-CM | POA: Diagnosis not present

## 2021-01-24 DIAGNOSIS — Z90722 Acquired absence of ovaries, bilateral: Secondary | ICD-10-CM | POA: Diagnosis not present

## 2021-01-24 DIAGNOSIS — C541 Malignant neoplasm of endometrium: Secondary | ICD-10-CM

## 2021-01-24 DIAGNOSIS — Z79899 Other long term (current) drug therapy: Secondary | ICD-10-CM | POA: Insufficient documentation

## 2021-01-24 DIAGNOSIS — Z5111 Encounter for antineoplastic chemotherapy: Secondary | ICD-10-CM | POA: Diagnosis present

## 2021-01-24 DIAGNOSIS — Z90710 Acquired absence of both cervix and uterus: Secondary | ICD-10-CM | POA: Insufficient documentation

## 2021-01-24 DIAGNOSIS — Z95828 Presence of other vascular implants and grafts: Secondary | ICD-10-CM

## 2021-01-24 LAB — CBC WITH DIFFERENTIAL/PLATELET
Abs Immature Granulocytes: 0.02 10*3/uL (ref 0.00–0.07)
Basophils Absolute: 0.1 10*3/uL (ref 0.0–0.1)
Basophils Relative: 1 %
Eosinophils Absolute: 0.3 10*3/uL (ref 0.0–0.5)
Eosinophils Relative: 4 %
HCT: 37.2 % (ref 36.0–46.0)
Hemoglobin: 11.9 g/dL — ABNORMAL LOW (ref 12.0–15.0)
Immature Granulocytes: 0 %
Lymphocytes Relative: 43 %
Lymphs Abs: 2.9 10*3/uL (ref 0.7–4.0)
MCH: 27.5 pg (ref 26.0–34.0)
MCHC: 32 g/dL (ref 30.0–36.0)
MCV: 85.9 fL (ref 80.0–100.0)
Monocytes Absolute: 0.6 10*3/uL (ref 0.1–1.0)
Monocytes Relative: 10 %
Neutro Abs: 2.8 10*3/uL (ref 1.7–7.7)
Neutrophils Relative %: 42 %
Platelets: 278 10*3/uL (ref 150–400)
RBC: 4.33 MIL/uL (ref 3.87–5.11)
RDW: 15.9 % — ABNORMAL HIGH (ref 11.5–15.5)
WBC: 6.7 10*3/uL (ref 4.0–10.5)
nRBC: 0 % (ref 0.0–0.2)

## 2021-01-24 LAB — COMPREHENSIVE METABOLIC PANEL
ALT: 30 U/L (ref 0–44)
AST: 23 U/L (ref 15–41)
Albumin: 3.8 g/dL (ref 3.5–5.0)
Alkaline Phosphatase: 105 U/L (ref 38–126)
Anion gap: 7 (ref 5–15)
BUN: 20 mg/dL (ref 6–20)
CO2: 25 mmol/L (ref 22–32)
Calcium: 8.9 mg/dL (ref 8.9–10.3)
Chloride: 107 mmol/L (ref 98–111)
Creatinine, Ser: 0.62 mg/dL (ref 0.44–1.00)
GFR, Estimated: >60 mL/min (ref >60)
Glucose, Bld: 112 mg/dL — ABNORMAL HIGH (ref 70–99)
Potassium: 3.5 mmol/L (ref 3.5–5.1)
Sodium: 139 mmol/L (ref 135–145)
Total Bilirubin: 0.2 mg/dL — ABNORMAL LOW (ref 0.3–1.2)
Total Protein: 6.7 g/dL (ref 6.5–8.1)

## 2021-01-24 LAB — MAGNESIUM: Magnesium: 1.8 mg/dL (ref 1.7–2.4)

## 2021-01-24 MED ORDER — DIPHENHYDRAMINE HCL 50 MG/ML IJ SOLN
25.0000 mg | Freq: Once | INTRAMUSCULAR | Status: AC
  Administered 2021-01-24: 25 mg via INTRAVENOUS
  Filled 2021-01-24: qty 1

## 2021-01-24 MED ORDER — SODIUM CHLORIDE 0.9 % IV SOLN
140.0000 mg/m2 | Freq: Once | INTRAVENOUS | Status: AC
  Administered 2021-01-24: 306 mg via INTRAVENOUS
  Filled 2021-01-24: qty 51

## 2021-01-24 MED ORDER — SODIUM CHLORIDE 0.9% FLUSH
10.0000 mL | INTRAVENOUS | Status: DC | PRN
  Administered 2021-01-24: 10 mL

## 2021-01-24 MED ORDER — SODIUM CHLORIDE 0.9 % IV SOLN: Freq: Once | INTRAVENOUS | Status: AC

## 2021-01-24 MED ORDER — HEPARIN SOD (PORK) LOCK FLUSH 100 UNIT/ML IV SOLN
500.0000 [IU] | Freq: Once | INTRAVENOUS | Status: AC | PRN
  Administered 2021-01-24: 500 [IU]

## 2021-01-24 MED ORDER — SODIUM CHLORIDE 0.9 % IV SOLN
10.0000 mg | Freq: Once | INTRAVENOUS | Status: AC
  Administered 2021-01-24: 10 mg via INTRAVENOUS
  Filled 2021-01-24: qty 10

## 2021-01-24 MED ORDER — PALONOSETRON HCL INJECTION 0.25 MG/5ML
0.2500 mg | Freq: Once | INTRAVENOUS | Status: AC
  Administered 2021-01-24: 0.25 mg via INTRAVENOUS
  Filled 2021-01-24: qty 5

## 2021-01-24 MED ORDER — FAMOTIDINE 20 MG IN NS 100 ML IVPB: 20.0000 mg | Freq: Once | INTRAVENOUS | Status: DC

## 2021-01-24 MED ORDER — FAMOTIDINE IN NACL 20-0.9 MG/50ML-% IV SOLN
20.0000 mg | Freq: Once | INTRAVENOUS | Status: AC
  Administered 2021-01-24: 20 mg via INTRAVENOUS
  Filled 2021-01-24: qty 50

## 2021-01-24 NOTE — Progress Notes (Signed)
Patient presents today for chemotherapy infusion.  Patient is in satisfactory condition with no new complaints voiced.  Vital signs are stable.  Labs reviewed and all labs are within treatment parameters. We will proceed with treatment per MD orders.   Patient tolerated treatment well with no complaints voiced.  Patient left ambulatory in stable condition.  Vital signs stable at discharge.  Follow up as scheduled.    

## 2021-01-24 NOTE — Progress Notes (Signed)
Patients port flushed without difficulty.  Good blood return noted with no bruising or swelling noted at site.  Stable during access and blood draw.  Patient to remain accessed for treatment. 

## 2021-01-24 NOTE — Patient Instructions (Signed)
[facility]  Discharge Instructions: Thank you for choosing [facility] to provide your oncology and hematology care.  If you have a lab appointment with the Cancer Center, please come in thru the Main Entrance and check in at the main information desk.  Wear comfortable clothing and clothing appropriate for easy access to any Portacath or PICC line.   We strive to give you quality time with your provider. You may need to reschedule your appointment if you arrive late (15 or more minutes).  Arriving late affects you and other patients whose appointments are after yours.  Also, if you miss three or more appointments without notifying the office, you may be dismissed from the clinic at the provider's discretion.      For prescription refill requests, have your pharmacy contact our office and allow 72 hours for refills to be completed.        To help prevent nausea and vomiting after your treatment, we encourage you to take your nausea medication as directed.  BELOW ARE SYMPTOMS THAT SHOULD BE REPORTED IMMEDIATELY: *FEVER GREATER THAN 100.4 F (38 C) OR HIGHER *CHILLS OR SWEATING *NAUSEA AND VOMITING THAT IS NOT CONTROLLED WITH YOUR NAUSEA MEDICATION *UNUSUAL SHORTNESS OF BREATH *UNUSUAL BRUISING OR BLEEDING *URINARY PROBLEMS (pain or burning when urinating, or frequent urination) *BOWEL PROBLEMS (unusual diarrhea, constipation, pain near the anus) TENDERNESS IN MOUTH AND THROAT WITH OR WITHOUT PRESENCE OF ULCERS (sore throat, sores in mouth, or a toothache) UNUSUAL RASH, SWELLING OR PAIN  UNUSUAL VAGINAL DISCHARGE OR ITCHING   Items with * indicate a potential emergency and should be followed up as soon as possible or go to the Emergency Department if any problems should occur.  Please show the CHEMOTHERAPY ALERT CARD or IMMUNOTHERAPY ALERT CARD at check-in to the Emergency Department and triage nurse.  Should you have questions after your visit or need to cancel  or reschedule your appointment, please contact [facility] [phone]  and follow the prompts.  Office hours are 8:00 a.m. to 4:30 p.m. Monday - Friday. Please note that voicemails left after 4:00 p.m. may not be returned until the following business day.  We are closed weekends and major holidays. You have access to a nurse at all times for urgent questions. Please call the main number to the clinic [phone] and follow the prompts.  For any non-urgent questions, you may also contact your provider using MyChart. We now offer e-Visits for anyone 18 and older to request care online for non-urgent symptoms. For details visit [email or web address].   Also download the MyChart app! Go to the app store, search "MyChart", open the app, select , and log in with your MyChart username and password.  Due to Covid, a mask is required upon entering the hospital/clinic. If you do not have a mask, one will be given to you upon arrival. For doctor visits, patients may have 1 support person aged 18 or older with them. For treatment visits, patients cannot have anyone with them due to current Covid guidelines and our immunocompromised population.  

## 2021-01-25 LAB — CA 125: Cancer Antigen (CA) 125: 7.4 U/mL (ref 0.0–38.1)

## 2021-01-27 ENCOUNTER — Other Ambulatory Visit (HOSPITAL_COMMUNITY): Payer: Self-pay | Admitting: Hematology and Oncology

## 2021-01-28 ENCOUNTER — Encounter (HOSPITAL_COMMUNITY): Payer: Self-pay | Admitting: Hematology

## 2021-01-28 ENCOUNTER — Encounter: Payer: Self-pay | Admitting: Nurse Practitioner

## 2021-01-28 ENCOUNTER — Ambulatory Visit (INDEPENDENT_AMBULATORY_CARE_PROVIDER_SITE_OTHER): Payer: Medicaid Other | Admitting: Nurse Practitioner

## 2021-01-28 ENCOUNTER — Other Ambulatory Visit: Payer: Self-pay

## 2021-01-28 VITALS — BP 129/86 | HR 88 | Ht 64.0 in | Wt 234.0 lb

## 2021-01-28 DIAGNOSIS — I10 Essential (primary) hypertension: Secondary | ICD-10-CM

## 2021-01-28 DIAGNOSIS — F321 Major depressive disorder, single episode, moderate: Secondary | ICD-10-CM

## 2021-01-28 DIAGNOSIS — Z139 Encounter for screening, unspecified: Secondary | ICD-10-CM

## 2021-01-28 DIAGNOSIS — R052 Subacute cough: Secondary | ICD-10-CM | POA: Insufficient documentation

## 2021-01-28 DIAGNOSIS — E785 Hyperlipidemia, unspecified: Secondary | ICD-10-CM

## 2021-01-28 DIAGNOSIS — G4700 Insomnia, unspecified: Secondary | ICD-10-CM

## 2021-01-28 DIAGNOSIS — F419 Anxiety disorder, unspecified: Secondary | ICD-10-CM | POA: Diagnosis not present

## 2021-01-28 DIAGNOSIS — J209 Acute bronchitis, unspecified: Secondary | ICD-10-CM | POA: Insufficient documentation

## 2021-01-28 MED ORDER — BENZONATATE 100 MG PO CAPS: 100.0000 mg | ORAL_CAPSULE | Freq: Two times a day (BID) | ORAL | 0 refills | Status: DC | PRN

## 2021-01-28 MED ORDER — SERTRALINE HCL 100 MG PO TABS: 100.0000 mg | ORAL_TABLET | Freq: Every day | ORAL | 3 refills | Status: DC

## 2021-01-28 NOTE — Assessment & Plan Note (Addendum)
Tessalon 100mg  BID Pt told to call the office if she develops fever, chills or symptoms gets worse.  Azithromycin ordered due to pt been immunocompromised.

## 2021-01-28 NOTE — Patient Instructions (Addendum)
Please take Take tessalon 100 mg two times daily as needed for your cough.   Please take sertraline 100 mg daily for your depression. You have been referred for counseling for your your depression and anxiety.   Get your pneumonia vaccine at your pharmacy     It is important that you exercise regularly at least 30 minutes 5 times a week.  Think about what you will eat, plan ahead. Choose " clean, green, fresh or frozen" over canned, processed or packaged foods which are more sugary, salty and fatty. 70 to 75% of food eaten should be vegetables and fruit. Three meals at set times with snacks allowed between meals, but they must be fruit or vegetables. Aim to eat over a 12 hour period , example 7 am to 7 pm, and STOP after  your last meal of the day. Drink water,generally about 64 ounces per day, no other drink is as healthy. Fruit juice is best enjoyed in a healthy way, by EATING the fruit.  Thanks for choosing [facility], we consider it a privelige to serve you.

## 2021-01-28 NOTE — Assessment & Plan Note (Signed)
DASH diet and commitment to daily physical activity for a minimum of 30 minutes discussed and encouraged, as a part of hypertension management. The importance of attaining a healthy weight is also discussed.  BP/Weight 01/28/2021 01/24/2021 01/24/2021 12/26/2020 12/20/2020 12/20/2020 49/49/4473  Systolic BP 958 441 712 787 183 672 550  Diastolic BP 86 81 72 81 64 64 84  Wt. (Lbs) 234 - 234.57 236 - 235.7 -  BMI 40.17 - 42.09 40.51 - 40.46 -  well controlled continue current med.

## 2021-01-28 NOTE — Assessment & Plan Note (Signed)
°  Depression and anxiety not well controlled. She has been taking sertraline 50 mg daily increase dose to 100mg  daily. Refer to psych for counseling.  PHQ-14 and GAD-12 today

## 2021-01-28 NOTE — Progress Notes (Signed)
° °  Jessica [name]     MRN: [id number]      DOB: August 20, 1960   HPI [name] is here for follow up for depression and re-evaluation of chronic medical conditions, medication management and review of any available recent lab and radiology data.  Preventive health is updated, specifically  Cancer screening and Immunization.   Questions or concerns regarding consultations or procedures which the PT has had in the interim are  addressed. The PT denies any adverse reactions to current medications since the last visit.   Pt stated that she is still depressed,she is fighting cancer, has endometrial cancer that has spread to her colon, her mother and sister also have cancer. Pt was tearful during this visit, denies SI, HI   Pt c/o cough that has lasted since she was treated for flu ending of nove., 2022. She does have chest congestion, with yellowish sputum, denies fever, chills ,wheezing, bloody sputum, malaise .COVID test at home 1.5 weeks ago was negative. OTC mucinex has not helped her cough.  Had chemo a few days ago, she usually have SOB after chemo.   She take benadryl every 4 days a needed for insomnia , pt states that benadryl helps her insomnia. Pt state that her oncologist told her not to worry about having a colonoscopy done or now, she has had first dose of shingles vaccine, she will gt an apt to get the second dose. Pt told to get her pneumonia vaccine ather pharmacy.    ROS Denies recent fever or chills. Denies sinus pressure, nasal congestion, ear pain or sore throat. Has chest congestion, has productive cough, denies wheezing, has SOB after chemo Denies chest pains, palpitations and leg swelling Denies abdominal pain, nausea, vomiting,diarrhea or constipation.   Denies dysuria, frequency, hesitancy or incontinence. Denies joint pain, swelling and limitation in mobility. Denies headaches, seizures, numbness, or tingling. Has  depression, anxiety and insomnia., denies  IS,HI Denies skin break down or rash.   PE  BP 129/86 (BP Location: Left Arm, Patient Position: Sitting, Cuff Size: Large)    Pulse 88    Ht 5\' 4"  (1.626 m)    Wt 234 lb (106.1 kg)    SpO2 95%    BMI 40.17 kg/m   Patient alert and oriented and in no cardiopulmonary distress.  HEENT: No facial asymmetry, EOMI,     Neck supple .  Chest: Clear to auscultation bilaterally.  CVS: S1, S2 no murmurs, no S3.Regular rate.  ABD: Soft non tender.   Ext: No edema  MS: Adequate ROM spine, shoulders, hips and knees.  Skin: Intact, no ulcerations or rash noted.  Psych: Good eye contact, normal affect. .PT was tearful. GAD-12, QKM6-38.  CNS: CN 2-12 intact, power,  normal throughout.no focal deficits noted.   Assessment & Plan

## 2021-01-28 NOTE — Assessment & Plan Note (Signed)
Continue benadryl as needed.  Pt states that benadryl  helps her to sleep.

## 2021-01-28 NOTE — Assessment & Plan Note (Signed)
Depression and anxiety not well controlled. She has been taking sertraline 50 mg daily increase dose to 100mg  daily. Refer to psych for counseling.  PHQ-14 and GAD-12 today

## 2021-01-28 NOTE — Assessment & Plan Note (Signed)
Wt Readings from Last 3 Encounters:  01/28/21 234 lb (106.1 kg)  01/24/21 234 lb 9.1 oz (106.4 kg)  12/26/20 236 lb (107 kg)  Importance of healthy food choices with portion control discussed as well as eating regularly within 12  hour window.   The need to choose clean green food 50%-75% of time is discussed as well as make water the primary drink and set a goal for 64 ounces daily.  Patient reeducated about the importance of committment to minimum of 150 minutes of exercise per week.  Three meals at set times with snacks allowed between meals but they must be fruit or vegetable.   Aim to eat  over 12 hour period  for example 7 am to 7 pm. Stop after your last meal of the day.

## 2021-01-28 NOTE — Assessment & Plan Note (Signed)
°  Takes atorvastatin 20mg  daily. check lipid panel

## 2021-01-29 ENCOUNTER — Other Ambulatory Visit (HOSPITAL_COMMUNITY): Payer: Self-pay

## 2021-01-29 MED ORDER — GABAPENTIN 300 MG PO CAPS: 300.0000 mg | ORAL_CAPSULE | Freq: Three times a day (TID) | ORAL | 6 refills | Status: DC

## 2021-01-29 MED ORDER — AZITHROMYCIN 250 MG PO TABS: ORAL_TABLET | ORAL | 0 refills | Status: AC

## 2021-01-29 NOTE — Addendum Note (Signed)
Addended by: Renee Rival on: 01/29/2021 11:47 AM   Modules accepted: Orders

## 2021-01-30 ENCOUNTER — Encounter: Payer: Medicaid Other | Admitting: Nurse Practitioner

## 2021-01-30 LAB — LIPID PANEL
Chol/HDL Ratio: 3.3 ratio (ref 0.0–4.4)
Cholesterol, Total: 172 mg/dL (ref 100–199)
HDL: 52 mg/dL (ref >39)
LDL Chol Calc (NIH): 105 mg/dL — ABNORMAL HIGH (ref 0–99)
Triglycerides: 81 mg/dL (ref 0–149)
VLDL Cholesterol Cal: 15 mg/dL (ref 5–40)

## 2021-02-07 ENCOUNTER — Ambulatory Visit (HOSPITAL_COMMUNITY)
Admission: RE | Admit: 2021-02-07 | Discharge: 2021-02-07 | Disposition: A | Discharge status: Home / Self Care | Payer: Medicaid Other | Source: Ambulatory Visit | Attending: Nurse Practitioner | Admitting: Nurse Practitioner

## 2021-02-07 ENCOUNTER — Ambulatory Visit (HOSPITAL_COMMUNITY): Payer: Medicaid Other

## 2021-02-07 ENCOUNTER — Other Ambulatory Visit: Payer: Self-pay | Admitting: Nurse Practitioner

## 2021-02-07 ENCOUNTER — Ambulatory Visit (HOSPITAL_COMMUNITY): Payer: Medicaid Other | Admitting: Hematology

## 2021-02-07 ENCOUNTER — Telehealth: Payer: Self-pay | Admitting: Nurse Practitioner

## 2021-02-07 ENCOUNTER — Other Ambulatory Visit (HOSPITAL_COMMUNITY): Payer: Medicaid Other

## 2021-02-07 ENCOUNTER — Other Ambulatory Visit: Payer: Self-pay

## 2021-02-07 DIAGNOSIS — R053 Chronic cough: Secondary | ICD-10-CM

## 2021-02-07 NOTE — Telephone Encounter (Signed)
Patient going today for xray and appt scheduled for Monday afternoon 01.23.2023

## 2021-02-07 NOTE — Telephone Encounter (Signed)
PT called in still having complications from visit 1/9  Stiff neck , diarrhea , cough in chest    Pt has been taking prescribed meds but is still having symptoms   Wants to know if needs more meds of if she soul just wait it out

## 2021-02-07 NOTE — Telephone Encounter (Signed)
Patient aware.

## 2021-02-11 ENCOUNTER — Ambulatory Visit (INDEPENDENT_AMBULATORY_CARE_PROVIDER_SITE_OTHER): Payer: Medicaid Other | Admitting: Nurse Practitioner

## 2021-02-11 ENCOUNTER — Encounter: Payer: Self-pay | Admitting: Nurse Practitioner

## 2021-02-11 ENCOUNTER — Other Ambulatory Visit: Payer: Self-pay

## 2021-02-11 VITALS — BP 121/79 | HR 72 | Resp 16 | Ht 64.0 in | Wt 234.1 lb

## 2021-02-11 DIAGNOSIS — R519 Headache, unspecified: Secondary | ICD-10-CM

## 2021-02-11 DIAGNOSIS — M542 Cervicalgia: Secondary | ICD-10-CM | POA: Insufficient documentation

## 2021-02-11 DIAGNOSIS — I10 Essential (primary) hypertension: Secondary | ICD-10-CM | POA: Diagnosis not present

## 2021-02-11 MED ORDER — CYCLOBENZAPRINE HCL 5 MG PO TABS
5.0000 mg | ORAL_TABLET | Freq: Three times a day (TID) | ORAL | 1 refills | Status: DC | PRN
Start: 2021-02-11 — End: 2022-06-02

## 2021-02-11 MED ORDER — IBUPROFEN 600 MG PO TABS: 600.0000 mg | ORAL_TABLET | Freq: Two times a day (BID) | ORAL | 0 refills | Status: DC | PRN

## 2021-02-11 NOTE — Progress Notes (Signed)
° °  Jessica [name]     MRN: [id number]      DOB: 07-Aug-1960   HPI Jessica [name] is here for follow up a Pt c/o right sided neck pain that started over a week, pain is constant , it feels just like you slept wrong, pain is 5/10. She has been taking oxycodone, Bengay, heating pad , nothing helps.she had surgery the last time she had this kind of pain , she had a bulging disk . Have HA 3/4 , she has been taking aleve and ibuprofen, denies N/V. changes in her mentation, chills, fever, recent changes in her vision.   Cough is much better. Xray was negative for pneumonia. Recently had antibiotics    ROS Denies recent fever or chills. Denies sinus pressure, nasal congestion, ear pain or sore throat. Denies chest congestion, productive cough or wheezing. Denies chest pains, palpitations and leg swelling Denies abdominal pain, nausea, vomiting,diarrhea or constipation.   Denies dysuria, frequency, hesitancy or incontinence. Has neck  pain, and limitation in mobility of the neck denies recent changes in her vision Has  headaches, denies seizures, numbness, or tingling.     PE  BP 121/79    Pulse 72    Resp 16    Ht 5\' 4"  (1.626 m)    Wt 234 lb 1.3 oz (106.2 kg)    SpO2 95%    BMI 40.18 kg/m   Patient alert and oriented and in no cardiopulmonary distress.  HEENT: No facial asymmetry,  Neck supple, limited ROM of the neck, voiced tenderness on range of motion of the neck  .  Chest: Clear to auscultation bilaterally.  CVS: S1, S2 no murmurs, no S3.Regular rate.  ABD: Soft non tender.   Ext: No edema  MS: Adequate ROM spine, shoulders, hips and knees  limited ROM of the neck   Skin: Intact, no ulcerations or rash noted.  Psych: Good eye contact, normal affect. Memory intact not anxious or depressed appearing.  CNS: CN 2-12 intact, power,  normal throughout.no focal deficits noted.   Assessment & Plan

## 2021-02-11 NOTE — Assessment & Plan Note (Signed)
RX ibuprofen 600 mg BID PRN Flexeril 5mg  TID PRN  Will refer to otho if pain does not get better. She had a surgery for bulging disc in 2000

## 2021-02-11 NOTE — Assessment & Plan Note (Signed)
Take tylenol or ibuprofen as needed. Pain probably due to neck pain.

## 2021-02-11 NOTE — Assessment & Plan Note (Signed)
DASH diet and commitment to daily physical activity for a minimum of 30 minutes discussed and encouraged, as a part of hypertension management. The importance of attaining a healthy weight is also discussed.  BP/Weight 02/11/2021 01/28/2021 01/24/2021 01/24/2021 12/26/2020 12/20/2020 16/01/958  Systolic BP 454 098 119 147 829 562 130  Diastolic BP 79 86 81 72 81 64 64  Wt. (Lbs) 234.08 234 - 234.57 236 - 235.7  BMI 40.18 40.17 - 42.09 40.51 - 40.46  continue current meds.

## 2021-02-11 NOTE — Patient Instructions (Signed)
Please take flexeril 5mg  three times daily for your neck pain Take ibuprofen 600mg  two times daily as needed for your neck pain.    It is important that you exercise regularly at least 30 minutes 5 times a week.  Think about what you will eat, plan ahead. Choose " clean, green, fresh or frozen" over canned, processed or packaged foods which are more sugary, salty and fatty. 70 to 75% of food eaten should be vegetables and fruit. Three meals at set times with snacks allowed between meals, but they must be fruit or vegetables. Aim to eat over a 12 hour period , example 7 am to 7 pm, and STOP after  your last meal of the day. Drink water,generally about 64 ounces per day, no other drink is as healthy. Fruit juice is best enjoyed in a healthy way, by EATING the fruit.  Thanks for choosing [facility], we consider it a privelige to serve you.

## 2021-02-14 ENCOUNTER — Inpatient Hospital Stay (HOSPITAL_BASED_OUTPATIENT_CLINIC_OR_DEPARTMENT_OTHER): Payer: Medicaid Other | Admitting: Hematology

## 2021-02-14 ENCOUNTER — Other Ambulatory Visit: Payer: Self-pay

## 2021-02-14 ENCOUNTER — Inpatient Hospital Stay (HOSPITAL_COMMUNITY): Payer: Medicaid Other

## 2021-02-14 VITALS — BP 144/79 | HR 76 | Temp 97.5°F | Resp 20

## 2021-02-14 DIAGNOSIS — Z95828 Presence of other vascular implants and grafts: Secondary | ICD-10-CM

## 2021-02-14 DIAGNOSIS — Z5111 Encounter for antineoplastic chemotherapy: Secondary | ICD-10-CM | POA: Diagnosis not present

## 2021-02-14 DIAGNOSIS — C541 Malignant neoplasm of endometrium: Secondary | ICD-10-CM

## 2021-02-14 LAB — CBC WITH DIFFERENTIAL/PLATELET
Abs Immature Granulocytes: 0.03 10*3/uL (ref 0.00–0.07)
Basophils Absolute: 0.1 10*3/uL (ref 0.0–0.1)
Basophils Relative: 1 %
Eosinophils Absolute: 0.2 10*3/uL (ref 0.0–0.5)
Eosinophils Relative: 2 %
HCT: 35.6 % — ABNORMAL LOW (ref 36.0–46.0)
Hemoglobin: 11.5 g/dL — ABNORMAL LOW (ref 12.0–15.0)
Immature Granulocytes: 0 %
Lymphocytes Relative: 35 %
Lymphs Abs: 2.5 10*3/uL (ref 0.7–4.0)
MCH: 28 pg (ref 26.0–34.0)
MCHC: 32.3 g/dL (ref 30.0–36.0)
MCV: 86.6 fL (ref 80.0–100.0)
Monocytes Absolute: 0.7 10*3/uL (ref 0.1–1.0)
Monocytes Relative: 9 %
Neutro Abs: 3.8 10*3/uL (ref 1.7–7.7)
Neutrophils Relative %: 53 %
Platelets: 253 10*3/uL (ref 150–400)
RBC: 4.11 MIL/uL (ref 3.87–5.11)
RDW: 16.4 % — ABNORMAL HIGH (ref 11.5–15.5)
WBC: 7.1 10*3/uL (ref 4.0–10.5)
nRBC: 0 % (ref 0.0–0.2)

## 2021-02-14 LAB — COMPREHENSIVE METABOLIC PANEL
ALT: 20 U/L (ref 0–44)
AST: 19 U/L (ref 15–41)
Albumin: 3.7 g/dL (ref 3.5–5.0)
Alkaline Phosphatase: 99 U/L (ref 38–126)
Anion gap: 8 (ref 5–15)
BUN: 18 mg/dL (ref 6–20)
CO2: 25 mmol/L (ref 22–32)
Calcium: 9.1 mg/dL (ref 8.9–10.3)
Chloride: 106 mmol/L (ref 98–111)
Creatinine, Ser: 0.67 mg/dL (ref 0.44–1.00)
GFR, Estimated: >60 mL/min (ref >60)
Glucose, Bld: 112 mg/dL — ABNORMAL HIGH (ref 70–99)
Potassium: 3.8 mmol/L (ref 3.5–5.1)
Sodium: 139 mmol/L (ref 135–145)
Total Bilirubin: 0.1 mg/dL — ABNORMAL LOW (ref 0.3–1.2)
Total Protein: 6.6 g/dL (ref 6.5–8.1)

## 2021-02-14 LAB — MAGNESIUM: Magnesium: 1.6 mg/dL — ABNORMAL LOW (ref 1.7–2.4)

## 2021-02-14 MED ORDER — SODIUM CHLORIDE 0.9 % IV SOLN
140.0000 mg/m2 | Freq: Once | INTRAVENOUS | Status: AC
  Administered 2021-02-14: 306 mg via INTRAVENOUS
  Filled 2021-02-14: qty 51

## 2021-02-14 MED ORDER — MAGNESIUM OXIDE -MG SUPPLEMENT 400 (240 MG) MG PO TABS: 400.0000 mg | ORAL_TABLET | Freq: Every day | ORAL | 6 refills | Status: DC

## 2021-02-14 MED ORDER — FAMOTIDINE IN NACL 20-0.9 MG/50ML-% IV SOLN
20.0000 mg | Freq: Once | INTRAVENOUS | Status: AC
  Administered 2021-02-14: 20 mg via INTRAVENOUS
  Filled 2021-02-14: qty 50

## 2021-02-14 MED ORDER — SODIUM CHLORIDE 0.9 % IV SOLN: Freq: Once | INTRAVENOUS | Status: AC

## 2021-02-14 MED ORDER — PALONOSETRON HCL INJECTION 0.25 MG/5ML
0.2500 mg | Freq: Once | INTRAVENOUS | Status: AC
  Administered 2021-02-14: 0.25 mg via INTRAVENOUS
  Filled 2021-02-14: qty 5

## 2021-02-14 MED ORDER — HEPARIN SOD (PORK) LOCK FLUSH 100 UNIT/ML IV SOLN
500.0000 [IU] | Freq: Once | INTRAVENOUS | Status: AC | PRN
  Administered 2021-02-14: 500 [IU]

## 2021-02-14 MED ORDER — DIPHENHYDRAMINE HCL 50 MG/ML IJ SOLN
25.0000 mg | Freq: Once | INTRAMUSCULAR | Status: AC
  Administered 2021-02-14: 25 mg via INTRAVENOUS
  Filled 2021-02-14: qty 1

## 2021-02-14 MED ORDER — SODIUM CHLORIDE 0.9% FLUSH
10.0000 mL | INTRAVENOUS | Status: DC | PRN
  Administered 2021-02-14: 10 mL

## 2021-02-14 MED ORDER — SODIUM CHLORIDE 0.9 % IV SOLN
10.0000 mg | Freq: Once | INTRAVENOUS | Status: AC
  Administered 2021-02-14: 10 mg via INTRAVENOUS
  Filled 2021-02-14: qty 10

## 2021-02-14 NOTE — Progress Notes (Signed)
Patient has been examined by Dr. Katragadda, and vital signs and labs have been reviewed. ANC, Creatinine, LFTs, hemoglobin, and platelets are within treatment parameters per M.D. - pt may proceed with treatment.    °

## 2021-02-14 NOTE — Patient Instructions (Signed)
[place] at [facility] Discharge Instructions   You were seen and examined today by Dr. Delton Coombes.  He reviewed your lab work with you which was normal/stable.  Your magnesium was slightly low, so Dr. Raliegh Ip sent a prescription for you to take 1 magnesium tablet per day.  We will proceed with your treatment today.  Return as scheduled for lab work and treatment.    Thank you for choosing [place] at [facility] to provide your oncology and hematology care.  To afford each patient quality time with our provider, please arrive at least 15 minutes before your scheduled appointment time.   If you have a lab appointment with the [facility] please come in thru the Main Entrance and check in at the main information desk.  You need to re-schedule your appointment should you arrive 10 or more minutes late.  We strive to give you quality time with our providers, and arriving late affects you and other patients whose appointments are after yours.  Also, if you no show three or more times for appointments you may be dismissed from the clinic at the providers discretion.     Again, thank you for choosing [facility].  Our hope is that these requests will decrease the amount of time that you wait before being seen by our physicians.       _____________________________________________________________  Should you have questions after your visit to [facility], please contact our office at [phone] and follow the prompts.  Our office hours are 8:00 a.m. and 4:30 p.m. Monday - Friday.  Please note that voicemails left after 4:00 p.m. may not be returned until the following business day.  We are closed weekends and major holidays.  You do have access to a nurse 24-7, just call the main number to the clinic [phone] and do not press any options, hold on the line and a nurse will answer the phone.    For prescription refill  requests, have your pharmacy contact our office and allow 72 hours.    Due to Covid, you will need to wear a mask upon entering the hospital. If you do not have a mask, a mask will be given to you at the Main Entrance upon arrival. For doctor visits, patients may have 1 support person age 28 or older with them. For treatment visits, patients can not have anyone with them due to social distancing guidelines and our immunocompromised population.

## 2021-02-14 NOTE — Progress Notes (Signed)
Patient presents today for chemotherapy treatment. Patient is in satisfactory condition with no complaints voiced.  Vital signs are stable.  Labs reviewed by Dr. Delton Coombes during her office visit.  All labs are within treatment parameters.  Magnesium today is 1.6.  We will hold IV magnesium today and patient will supplement at home with oral magnesium per Dr. Delton Coombes.  We will proceed with treatment per MD orders.   Patient tolerated treatment well with no complaints voiced.  Patient left ambulatory in stable condition.  Vital signs stable at discharge.  Follow up as scheduled.

## 2021-02-14 NOTE — Patient Instructions (Signed)
[facility]  Discharge Instructions: Thank you for choosing [facility] to provide your oncology and hematology care.  If you have a lab appointment with the Cancer Center, please come in thru the Main Entrance and check in at the main information desk.  Wear comfortable clothing and clothing appropriate for easy access to any Portacath or PICC line.   We strive to give you quality time with your provider. You may need to reschedule your appointment if you arrive late (15 or more minutes).  Arriving late affects you and other patients whose appointments are after yours.  Also, if you miss three or more appointments without notifying the office, you may be dismissed from the clinic at the provider's discretion.      For prescription refill requests, have your pharmacy contact our office and allow 72 hours for refills to be completed.        To help prevent nausea and vomiting after your treatment, we encourage you to take your nausea medication as directed.  BELOW ARE SYMPTOMS THAT SHOULD BE REPORTED IMMEDIATELY: *FEVER GREATER THAN 100.4 F (38 C) OR HIGHER *CHILLS OR SWEATING *NAUSEA AND VOMITING THAT IS NOT CONTROLLED WITH YOUR NAUSEA MEDICATION *UNUSUAL SHORTNESS OF BREATH *UNUSUAL BRUISING OR BLEEDING *URINARY PROBLEMS (pain or burning when urinating, or frequent urination) *BOWEL PROBLEMS (unusual diarrhea, constipation, pain near the anus) TENDERNESS IN MOUTH AND THROAT WITH OR WITHOUT PRESENCE OF ULCERS (sore throat, sores in mouth, or a toothache) UNUSUAL RASH, SWELLING OR PAIN  UNUSUAL VAGINAL DISCHARGE OR ITCHING   Items with * indicate a potential emergency and should be followed up as soon as possible or go to the Emergency Department if any problems should occur.  Please show the CHEMOTHERAPY ALERT CARD or IMMUNOTHERAPY ALERT CARD at check-in to the Emergency Department and triage nurse.  Should you have questions after your visit or need to cancel  or reschedule your appointment, please contact [facility] [phone]  and follow the prompts.  Office hours are 8:00 a.m. to 4:30 p.m. Monday - Friday. Please note that voicemails left after 4:00 p.m. may not be returned until the following business day.  We are closed weekends and major holidays. You have access to a nurse at all times for urgent questions. Please call the main number to the clinic [phone] and follow the prompts.  For any non-urgent questions, you may also contact your provider using MyChart. We now offer e-Visits for anyone 18 and older to request care online for non-urgent symptoms. For details visit [email or web address].   Also download the MyChart app! Go to the app store, search "MyChart", open the app, select [place], and log in with your MyChart username and password.  Due to Covid, a mask is required upon entering the hospital/clinic. If you do not have a mask, one will be given to you upon arrival. For doctor visits, patients may have 1 support person aged 18 or older with them. For treatment visits, patients cannot have anyone with them due to current Covid guidelines and our immunocompromised population.  

## 2021-02-14 NOTE — Progress Notes (Signed)
[name], [place] [address]   CLINIC:  Medical Oncology/Hematology  PCP:  Renee Rival, FNP [address] [place] / [place] [address] [phone]   REASON FOR VISIT:  Follow-up for recurrent endometrial cancer  PRIOR THERAPY: TAH & BSO, bilateral pelvic and para-aortic lymph nodes dissections on 04/13/2018  NGS Results: not done  CURRENT THERAPY: Paclitaxel & Aloxi every 3 weeks  BRIEF ONCOLOGIC HISTORY:  Oncology History  Endometrial cancer ([facility])  03/31/2018 Initial Diagnosis   Endometrial cancer ([facility])   12/29/2018 -  Chemotherapy   Patient is on Treatment Plan : UTERINE Carboplatin AUC 6 / Paclitaxel q21d     06/06/2020 Miscellaneous   Summary of oncologic history from Dr. Delton Coombes   1.  Recurrent endometrial carcinosarcoma: -TAH, BSO, bilateral pelvic and para-aortic lymph node resections on 04/13/2018. -Pathology showed carcinosarcoma (malignant mixed mullerian tumor) is arising in an endometrial type polyp with no myometrial invasion identified.  High-grade.  0/39 lymph nodes positive.  PT1APN0, FIGO stage Ia.  MMR normal.  MSI-stable. -CTAP on 12/09/2018 for abdominal pain showed extensive peritoneal carcinomatosis and large soft tissue mass in the pelvis. -Biopsy of the omental mass on 12/28/2018 shows poorly differentiated carcinoma consistent with her prior malignancy. -Carboplatin and paclitaxel started on 12/29/2018. -CT scan on 05/02/2019 showed peritoneal implants in the low central small bowel mesentery and left pelvic sidewall have decreased in size measuring 1.5 x 1.9 cm and 1.7 x 2.2 cm.  Soft tissue nodule in the left lateral omentum measures 1 x 1.7 cm with no evidence of metastatic disease. -Continuation of chemotherapy until complete response was recommended. -CT AP on 07/04/2019 showed continued positive response to therapy with scattered peritoneal metastasis in the pelvis and left omentum  decreased in size.  No new metastatic disease. -CTAP on 10/24/2019 after 14 cycles showed substantial reduction in size of soft tissue nodules in the pelvis.  1.4 x 0.9 x 1 cm soft tissue density in the inferior serosal margin of the sigmoid colon, previously 1.7 x 1.1 x 1.7 cm.  2 small soft tissue nodules in the mesenteric adipose tissue above the urinary bladder measures 0.8 cm. -She has developed serious allergic reaction during cycle 14 with carboplatin. -I have discussed with Dr. Denman George.  Other options include adding ifosfamide to Taxol or Doxil and combination of lenvatinib with pembrolizumab.  In the lenvatinib pembrolizumab trial carcinosarcomas were not included. -Single agent paclitaxel started on 11/09/2019, dose reduced by 20% on 01/18/2020. -CTAP on 01/10/2020 with stable small peritoneal soft tissue nodules in the pelvis.  Resolution of soft tissue nodularity in the left abdominal omental fat with no new or progressive disease.     06/20/2020 Imaging   1. Stable examination status post hysterectomy without suspicious enhancing soft tissue nodularity at the vaginal cuff and no significant change in the multiple small soft tissue nodules in the pelvis likely representing treated tumor. No new or progressive findings. 2. Cholelithiasis including a large gallstone in the neck of the gallbladder but without CT evidence of acute cholecystitis. 3. Small hiatal hernia. 4. Aortic atherosclerosis.       CANCER STAGING:  Cancer Staging  No matching staging information was found for the patient.  INTERVAL HISTORY:  [name], a 61 y.o. female, returns for routine follow-up and consideration for next cycle of chemotherapy. Jessica [name] was last seen on 12/20/2020.  Due for cycle #35 of Paclitaxel & Aloxi today.   Overall,  she tells me she has been feeling pretty well. She reports diarrhea for 4-5 days following treatment occuring 2-3 times daily. She has not taken imodium. She reports dry  cough, and right sided neck pain which lasted 1 week and began to improve yesterday. She is taking oxycodone twice weekly. She continues to take Norvasc daily, and Gabapentin at night. Her neuropathy is stable.   Overall, she feels ready for next cycle of chemo today.    REVIEW OF SYSTEMS:  Review of Systems  Constitutional:  Negative for appetite change and fatigue.  Respiratory:  Positive for cough.   Gastrointestinal:  Positive for diarrhea and nausea.  Musculoskeletal:  Positive for neck pain.  Neurological:  Positive for numbness.  Psychiatric/Behavioral:  Positive for sleep disturbance.   All other systems reviewed and are negative.  PAST MEDICAL/SURGICAL HISTORY:  Past Medical History:  Diagnosis Date   Cancer ([facility])    Phreesia 09/05/2019   Elevated blood pressure reading    Endometrial cancer ([facility])    Hyperlipidemia    Hypertension    PMB (postmenopausal bleeding)    Port-A-Cath in place 12/24/2018   Past Surgical History:  Procedure Laterality Date   ABDOMINAL HYSTERECTOMY N/A    Phreesia 09/05/2019   IR IMAGING GUIDED PORT INSERTION  12/28/2018   IR RADIOLOGIST EVAL & MGMT  06/02/2018   IR RADIOLOGIST EVAL & MGMT  06/16/2018   NECK SURGERY  2000   spinal surgery , titanium rod in place    ROBOTIC ASSISTED TOTAL HYSTERECTOMY WITH BILATERAL SALPINGO OOPHERECTOMY N/A 04/13/2018   Procedure: XI ROBOTIC [place];  Surgeon: Everitt Amber, MD;  Location: [facility];  Service: Gynecology;  Laterality: N/A;   ROBOTIC PELVIC AND PARA-AORTIC LYMPH NODE DISSECTION N/A 04/13/2018   Procedure: XI ROBOTIC PELVIC LYMPHADECTOMY AND PARA-AORTIC LYMPH NODE DISSECTION;  Surgeon: Everitt Amber, MD;  Location: [facility];  Service: Gynecology;  Laterality: N/A;    SOCIAL HISTORY:  Social History   Socioeconomic History   Marital status: Widowed    Spouse name: Not on file   Number of children: 3   Years of education: Not on file   Highest  education level: Not on file  Occupational History   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  Tobacco Use   Smoking status: Never   Smokeless tobacco: Never  Vaping Use   Vaping Use: Never used  Substance and Sexual Activity   Alcohol use: Yes    Alcohol/week: 4.0 standard drinks    Types: 4 Glasses of wine per week    Comment: weekends   Drug use: Never   Sexual activity: Not Currently  Other Topics Concern   Not on file  Social History Narrative   Lives with daughter and granddaughter here in [place], moved 3 years ago      Cats: Zoe-85 years old, Eduard Clos, and Storm      Enjoys: walking when she can tolerate- spending time with granddaughter (her life)      Diet: eats all food groups   Caffeine: 1-2 cups at times, but does not tolerate it all the time   Water: 160 oz daily      Wears seat belt    Does not use phone while driving    Oceanographer at home   Fire extinguisher-no   No weapons   Social Determinants of Health   Financial Resource Strain: Not on file  Food Insecurity: Not on file  Transportation  Needs: Not on file  Physical Activity: Not on file  Stress: Not on file  Social Connections: Not on file  Intimate Partner Violence: Not on file    FAMILY HISTORY:  Family History  Problem Relation Age of Onset   Hypertension Father    Heart disease Father    Cancer Father        lung   Hypertension Mother    Cancer Sister        ovarian cancer    CURRENT MEDICATIONS:  Current Outpatient Medications  Medication Sig Dispense Refill   albuterol (VENTOLIN HFA) 108 (90 Base) MCG/ACT inhaler Inhale 2 puffs into the lungs every 6 (six) hours as needed for wheezing or shortness of breath. 8 g 2   amLODipine (NORVASC) 5 MG tablet Take 1 tablet (5 mg total) by mouth daily. 90 tablet 1   atorvastatin (LIPITOR) 20 MG tablet Take 1 tablet (20 mg total) by mouth daily. 90 tablet 1   benzonatate (TESSALON) 100 MG capsule Take 1 capsule (100 mg total) by  mouth 2 (two) times daily as needed for cough. 20 capsule 0   cyclobenzaprine (FLEXERIL) 5 MG tablet Take 1 tablet (5 mg total) by mouth 3 (three) times daily as needed for muscle spasms. 30 tablet 1   EPINEPHrine 0.3 mg/0.3 mL IJ SOAJ injection Inject 0.3 mg into the muscle as needed for anaphylaxis. 1 each 0   gabapentin (NEURONTIN) 300 MG capsule Take 1 capsule (300 mg total) by mouth 3 (three) times daily. 90 capsule 6   ibuprofen (ADVIL) 600 MG tablet Take 1 tablet (600 mg total) by mouth 2 (two) times daily as needed. 30 tablet 0   lidocaine-prilocaine (EMLA) cream Apply to port site prior to use (Patient taking differently: 1 application daily as needed (apply to port site prior to chemo).) 30 g 0   magnesium oxide (MAG-OX) 400 (240 Mg) MG tablet Take 1 tablet (400 mg total) by mouth daily. 30 tablet 6   oxyCODONE-acetaminophen (PERCOCET) 5-325 MG tablet Take 1 tablet by mouth every 12 (twelve) hours as needed (pain). 60 tablet 0   PACLITAXEL IV Inject into the vein every 21 ( twenty-one) days.     prochlorperazine (COMPAZINE) 10 MG tablet Take 1 tablet (10 mg total) by mouth every 6 (six) hours as needed (Nausea or vomiting). 60 tablet 3   sertraline (ZOLOFT) 100 MG tablet Take 1 tablet (100 mg total) by mouth daily. 30 tablet 3   No current facility-administered medications for this visit.   Facility-Administered Medications Ordered in Other Visits  Medication Dose Route Frequency Provider Last Rate Last Admin   diphenhydrAMINE (BENADRYL) 50 MG/ML injection            palonosetron (ALOXI) 0.25 MG/5ML injection             ALLERGIES:  Allergies  Allergen Reactions   Carboplatin Other (See Comments)    "body on fire and abdominal pain"   Nickel Rash    itchy    PHYSICAL EXAM:  Performance status (ECOG): 1 - Symptomatic but completely ambulatory  There were no vitals filed for this visit. Wt Readings from Last 3 Encounters:  02/14/21 236 lb 6.4 oz (107.2 kg)  02/11/21 234 lb  1.3 oz (106.2 kg)  01/28/21 234 lb (106.1 kg)   Physical Exam Vitals reviewed.  Constitutional:      Appearance: Normal appearance. She is obese.  Cardiovascular:     Rate and Rhythm: Normal rate and regular rhythm.  Pulses: Normal pulses.     Heart sounds: Normal heart sounds.  Pulmonary:     Effort: Pulmonary effort is normal.     Breath sounds: Normal breath sounds.  Abdominal:     Palpations: Abdomen is soft. There is no hepatomegaly, splenomegaly or mass.     Tenderness: There is no abdominal tenderness.  Musculoskeletal:     Right lower leg: No edema.     Left lower leg: No edema.  Neurological:     General: No focal deficit present.     Mental Status: She is alert and oriented to person, place, and time.  Psychiatric:        Mood and Affect: Mood normal.        Behavior: Behavior normal.    LABORATORY DATA:  I have reviewed the labs as listed.  CBC Latest Ref Rng & Units 02/14/2021 01/24/2021 12/20/2020  WBC 4.0 - 10.5 K/uL 7.1 6.7 6.9  Hemoglobin 12.0 - 15.0 g/dL 11.5(L) 11.9(L) 11.0(L)  Hematocrit 36.0 - 46.0 % 35.6(L) 37.2 34.1(L)  Platelets 150 - 400 K/uL 253 278 287   CMP Latest Ref Rng & Units 02/14/2021 01/24/2021 12/20/2020  Glucose 70 - 99 mg/dL 112(H) 112(H) 121(H)  BUN 6 - 20 mg/dL _0 Creatinine 0.44 - 1.00 mg/dL 0.67 0.62 0.63  Sodium 135 - 145 mmol/L 139 139 138  Potassium 3.5 - 5.1 mmol/L 3.8 3.5 3.5  Chloride 98 - 111 mmol/L 106 107 105  CO2 22 - 32 mmol/L _1 Calcium 8.9 - 10.3 mg/dL 9.1 8.9 8.9  Total Protein 6.5 - 8.1 g/dL 6.6 6.7 6.5  Total Bilirubin 0.3 - 1.2 mg/dL 0.1(L) 0.2(L) 0.5  Alkaline Phos 38 - 126 U/L 99 105 86  AST 15 - 41 U/L _2 ALT 0 - 44 U/L _3 DIAGNOSTIC IMAGING:  I have independently reviewed the scans and discussed with the patient. DG Chest 2 View  Result Date: 02/07/2021 CLINICAL DATA:  Chronic cough, endometrial cancer EXAM: CHEST - 2 VIEW COMPARISON:  12/07/2020 FINDINGS: Frontal and lateral  views of the chest demonstrate unremarkable cardiac silhouette. Right chest wall port tip within the superior vena cava. No airspace disease, effusion, or pneumothorax. No acute bony abnormalities. IMPRESSION: 1. No acute intrathoracic process. Electronically Signed   By: Randa Ngo M.D.   On: 02/07/2021 17:24     ASSESSMENT:  1.  Recurrent endometrial carcinosarcoma: -TAH, BSO, bilateral pelvic and para-aortic lymph node resections on 04/13/2018. -Pathology showed carcinosarcoma (malignant mixed mullerian tumor) is arising in an endometrial type polyp with no myometrial invasion identified.  High-grade.  0/39 lymph nodes positive.  PT1APN0, FIGO stage Ia.  MMR normal.  MSI-stable. -CTAP on 12/09/2018 for abdominal pain showed extensive peritoneal carcinomatosis and large soft tissue mass in the pelvis. -Biopsy of the omental mass on 12/28/2018 shows poorly differentiated carcinoma consistent with her prior malignancy. -Carboplatin and paclitaxel started on 12/29/2018. -CT scan on 05/02/2019 showed peritoneal implants in the low central small bowel mesentery and left pelvic sidewall have decreased in size measuring 1.5 x 1.9 cm and 1.7 x 2.2 cm.  Soft tissue nodule in the left lateral omentum measures 1 x 1.7 cm with no evidence of metastatic disease. -Continuation of chemotherapy until complete response was recommended. -CT AP on 07/04/2019 showed continued positive response to therapy with scattered peritoneal metastasis in the pelvis and left omentum decreased in size.  No new metastatic disease. -CTAP  on 10/24/2019 after 14 cycles showed substantial reduction in size of soft tissue nodules in the pelvis.  1.4 x 0.9 x 1 cm soft tissue density in the inferior serosal margin of the sigmoid colon, previously 1.7 x 1.1 x 1.7 cm.  2 small soft tissue nodules in the mesenteric adipose tissue above the urinary bladder measures 0.8 cm. -She has developed serious allergic reaction during cycle 14 with  carboplatin. -I have discussed with Dr. Denman George.  Other options include adding ifosfamide to Taxol or Doxil and combination of lenvatinib with pembrolizumab.  In the lenvatinib pembrolizumab trial carcinosarcomas were not included. -Single agent paclitaxel started on 11/09/2019, dose reduced by 20% on 01/18/2020. -CTAP on 01/10/2020 with stable small peritoneal soft tissue nodules in the pelvis.  Resolution of soft tissue nodularity in the left abdominal omental fat with no new or progressive disease.   2.  Breast abnormality: -Mammogram on 05/02/2019 shows BI-RADS Category 0.  She reportedly took Covid shot 1 to 2 weeks prior to the mammogram.  Further scans rescheduled on 06/21/2019.   3.  Peripheral neuropathy: -She reported numbness and occasional pins-and-needles sensation in the bilateral toes, right more than left.  This has started about a week ago.   PLAN:  1.  Recurrent endometrial carcinosarcoma: - CT CAP on 12/09/2020 showed tiny previously described pelvic soft tissue nodules, maximal diameter 1 cm unchanged.  No new progressive findings. - She is tolerating single agent paclitaxel reasonably well. - She reports diarrhea lasting about 4 to 5 days, 2-3 times per day after each cycle of chemotherapy.  She is not requiring Imodium. - She is having some dry cough since she had flu in November.  A chest x-ray was done by her PMD on 02/07/2021 reviewed by me did not show any acute intrathoracic process. - She reports right neck pain for the last 1 week which is constant.  Started feeling better yesterday.  She was told to call us if the pain does not subside in the next 1 to 2 weeks.  We will do imaging at that time. - Reviewed labs from today which showed magnesium is 1.6.  LFTs are normal.  CBC was grossly normal.  Last CEA was 7.4. - Proceed with the paclitaxel today and in 3 weeks.  RTC 6 weeks for follow-up.  We will plan to do CT CAP with contrast prior to next visit.  Will consider PARPi  maintenance based on the findings.   2.  Low back pain/right-sided abdominal pain: - She has on and off bilateral hip pains. - He is requiring oxycodone 5 mg 2-3 times per week.   3.  Hypertension: - Continue Norvasc 5 mg daily.  Blood pressure is fairly well controlled.   4.  Neuropathy: - Bilateral feet numbness and tingling right more than left. - Continue gabapentin 600 mg at bedtime.   5.  Abnormal mammogram: - Abnormal mammogram on 05/15/2020, status postbiopsy which was benign.  We will obtain another mammogram in April.  6.  Hypomagnesemia: - We will start her on magnesium 1 tablet daily.     Orders placed this encounter:  No orders of the defined types were placed in this encounter.    [name], MD [place] [facility] [phone]   I, Thana Ates, am acting as a scribe for Dr. [name].  I, [name] MD, have reviewed the above documentation for accuracy and completeness, and I agree with the above.

## 2021-02-15 LAB — CA 125: Cancer Antigen (CA) 125: 7.7 U/mL (ref 0.0–38.1)

## 2021-03-07 ENCOUNTER — Other Ambulatory Visit (HOSPITAL_COMMUNITY): Payer: Self-pay | Admitting: Hematology

## 2021-03-07 ENCOUNTER — Encounter (HOSPITAL_COMMUNITY): Payer: Self-pay

## 2021-03-07 ENCOUNTER — Other Ambulatory Visit: Payer: Self-pay

## 2021-03-07 ENCOUNTER — Inpatient Hospital Stay (HOSPITAL_COMMUNITY): Payer: Medicaid Other | Attending: Hematology

## 2021-03-07 ENCOUNTER — Inpatient Hospital Stay (HOSPITAL_COMMUNITY): Payer: Medicaid Other

## 2021-03-07 VITALS — BP 146/89 | HR 80 | Temp 96.6°F | Resp 18 | Ht 63.39 in | Wt 235.2 lb

## 2021-03-07 DIAGNOSIS — Z79899 Other long term (current) drug therapy: Secondary | ICD-10-CM | POA: Diagnosis not present

## 2021-03-07 DIAGNOSIS — C786 Secondary malignant neoplasm of retroperitoneum and peritoneum: Secondary | ICD-10-CM | POA: Diagnosis present

## 2021-03-07 DIAGNOSIS — G629 Polyneuropathy, unspecified: Secondary | ICD-10-CM | POA: Insufficient documentation

## 2021-03-07 DIAGNOSIS — C541 Malignant neoplasm of endometrium: Secondary | ICD-10-CM

## 2021-03-07 DIAGNOSIS — C772 Secondary and unspecified malignant neoplasm of intra-abdominal lymph nodes: Secondary | ICD-10-CM | POA: Diagnosis not present

## 2021-03-07 DIAGNOSIS — I10 Essential (primary) hypertension: Secondary | ICD-10-CM | POA: Diagnosis not present

## 2021-03-07 DIAGNOSIS — Z5111 Encounter for antineoplastic chemotherapy: Secondary | ICD-10-CM | POA: Insufficient documentation

## 2021-03-07 DIAGNOSIS — Z95828 Presence of other vascular implants and grafts: Secondary | ICD-10-CM

## 2021-03-07 LAB — CBC WITH DIFFERENTIAL/PLATELET
Abs Immature Granulocytes: 0.03 10*3/uL (ref 0.00–0.07)
Basophils Absolute: 0.1 10*3/uL (ref 0.0–0.1)
Basophils Relative: 1 %
Eosinophils Absolute: 0.1 10*3/uL (ref 0.0–0.5)
Eosinophils Relative: 2 %
HCT: 37.2 % (ref 36.0–46.0)
Hemoglobin: 11.7 g/dL — ABNORMAL LOW (ref 12.0–15.0)
Immature Granulocytes: 0 %
Lymphocytes Relative: 31 %
Lymphs Abs: 2.3 10*3/uL (ref 0.7–4.0)
MCH: 26.8 pg (ref 26.0–34.0)
MCHC: 31.5 g/dL (ref 30.0–36.0)
MCV: 85.3 fL (ref 80.0–100.0)
Monocytes Absolute: 0.7 10*3/uL (ref 0.1–1.0)
Monocytes Relative: 10 %
Neutro Abs: 4.1 10*3/uL (ref 1.7–7.7)
Neutrophils Relative %: 56 %
Platelets: 291 10*3/uL (ref 150–400)
RBC: 4.36 MIL/uL (ref 3.87–5.11)
RDW: 16.2 % — ABNORMAL HIGH (ref 11.5–15.5)
WBC: 7.4 10*3/uL (ref 4.0–10.5)
nRBC: 0 % (ref 0.0–0.2)

## 2021-03-07 LAB — COMPREHENSIVE METABOLIC PANEL
ALT: 21 U/L (ref 0–44)
AST: 22 U/L (ref 15–41)
Albumin: 3.8 g/dL (ref 3.5–5.0)
Alkaline Phosphatase: 106 U/L (ref 38–126)
Anion gap: 13 (ref 5–15)
BUN: 13 mg/dL (ref 6–20)
CO2: 22 mmol/L (ref 22–32)
Calcium: 9.1 mg/dL (ref 8.9–10.3)
Chloride: 104 mmol/L (ref 98–111)
Creatinine, Ser: 0.68 mg/dL (ref 0.44–1.00)
GFR, Estimated: >60 mL/min (ref >60)
Glucose, Bld: 123 mg/dL — ABNORMAL HIGH (ref 70–99)
Potassium: 3.7 mmol/L (ref 3.5–5.1)
Sodium: 139 mmol/L (ref 135–145)
Total Bilirubin: 0.4 mg/dL (ref 0.3–1.2)
Total Protein: 6.7 g/dL (ref 6.5–8.1)

## 2021-03-07 LAB — MAGNESIUM: Magnesium: 1.6 mg/dL — ABNORMAL LOW (ref 1.7–2.4)

## 2021-03-07 MED ORDER — SODIUM CHLORIDE 0.9 % IV SOLN
140.0000 mg/m2 | Freq: Once | INTRAVENOUS | Status: AC
  Administered 2021-03-07: 306 mg via INTRAVENOUS
  Filled 2021-03-07: qty 51

## 2021-03-07 MED ORDER — HEPARIN SOD (PORK) LOCK FLUSH 100 UNIT/ML IV SOLN
500.0000 [IU] | Freq: Once | INTRAVENOUS | Status: AC | PRN
  Administered 2021-03-07: 500 [IU]

## 2021-03-07 MED ORDER — MAGNESIUM OXIDE -MG SUPPLEMENT 400 (240 MG) MG PO TABS
400.0000 mg | ORAL_TABLET | Freq: Once | ORAL | Status: AC
  Administered 2021-03-07: 400 mg via ORAL
  Filled 2021-03-07: qty 1

## 2021-03-07 MED ORDER — SODIUM CHLORIDE 0.9 % IV SOLN
10.0000 mg | Freq: Once | INTRAVENOUS | Status: AC
  Administered 2021-03-07: 10 mg via INTRAVENOUS
  Filled 2021-03-07: qty 10

## 2021-03-07 MED ORDER — SODIUM CHLORIDE 0.9% FLUSH
10.0000 mL | INTRAVENOUS | Status: DC | PRN
  Administered 2021-03-07: 10 mL

## 2021-03-07 MED ORDER — MAGNESIUM OXIDE -MG SUPPLEMENT 400 (240 MG) MG PO TABS: 400.0000 mg | ORAL_TABLET | Freq: Two times a day (BID) | ORAL | 6 refills | Status: DC

## 2021-03-07 MED ORDER — FAMOTIDINE IN NACL 20-0.9 MG/50ML-% IV SOLN
20.0000 mg | Freq: Once | INTRAVENOUS | Status: AC
  Administered 2021-03-07: 20 mg via INTRAVENOUS
  Filled 2021-03-07: qty 50

## 2021-03-07 MED ORDER — DIPHENHYDRAMINE HCL 50 MG/ML IJ SOLN
25.0000 mg | Freq: Once | INTRAMUSCULAR | Status: AC
  Administered 2021-03-07: 25 mg via INTRAVENOUS
  Filled 2021-03-07: qty 1

## 2021-03-07 MED ORDER — PALONOSETRON HCL INJECTION 0.25 MG/5ML
0.2500 mg | Freq: Once | INTRAVENOUS | Status: AC
  Administered 2021-03-07: 0.25 mg via INTRAVENOUS
  Filled 2021-03-07: qty 5

## 2021-03-07 MED ORDER — SODIUM CHLORIDE 0.9 % IV SOLN: Freq: Once | INTRAVENOUS | Status: AC

## 2021-03-07 NOTE — Progress Notes (Signed)
Patient presents today for Taxol, labs within normal limits. Magnesium 1.6 Dr. Delton Coombes made aware, received orders for 400mg  of PO magnesium oxide and patient is too increase magnesium at home to BID, patient verbalizes understanding. Patient tolerated chemotherapy with no complaints voiced. Side effects with management reviewed understanding verbalized. Port site clean and dry with no bruising or swelling noted at site. Good blood return noted before and after administration of chemotherapy. Band aid applied. Patient left in satisfactory condition with VSS and no s/s of distress noted.

## 2021-03-07 NOTE — Patient Instructions (Signed)
[facility]  Discharge Instructions: Thank you for choosing [facility] to provide your oncology and hematology care.  If you have a lab appointment with the [facility], please come in thru the Main Entrance and check in at the main information desk.  Wear comfortable clothing and clothing appropriate for easy access to any Portacath or PICC line.   We strive to give you quality time with your provider. You may need to reschedule your appointment if you arrive late (15 or more minutes).  Arriving late affects you and other patients whose appointments are after yours.  Also, if you miss three or more appointments without notifying the office, you may be dismissed from the clinic at the providers discretion.      For prescription refill requests, have your pharmacy contact our office and allow 72 hours for refills to be completed.    Today you received the following chemotherapy and/or immunotherapy agents Taxol. Dr. Delton Coombes would like for you to increase your Magnesium to twice a day. Return as scheduled.   To help prevent nausea and vomiting after your treatment, we encourage you to take your nausea medication as directed.  BELOW ARE SYMPTOMS THAT SHOULD BE REPORTED IMMEDIATELY: *FEVER GREATER THAN 100.4 F (38 C) OR HIGHER *CHILLS OR SWEATING *NAUSEA AND VOMITING THAT IS NOT CONTROLLED WITH YOUR NAUSEA MEDICATION *UNUSUAL SHORTNESS OF BREATH *UNUSUAL BRUISING OR BLEEDING *URINARY PROBLEMS (pain or burning when urinating, or frequent urination) *BOWEL PROBLEMS (unusual diarrhea, constipation, pain near the anus) TENDERNESS IN MOUTH AND THROAT WITH OR WITHOUT PRESENCE OF ULCERS (sore throat, sores in mouth, or a toothache) UNUSUAL RASH, SWELLING OR PAIN  UNUSUAL VAGINAL DISCHARGE OR ITCHING   Items with * indicate a potential emergency and should be followed up as soon as possible or go to the Emergency Department if any problems should occur.  Please show  the CHEMOTHERAPY ALERT CARD or IMMUNOTHERAPY ALERT CARD at check-in to the Emergency Department and triage nurse.  Should you have questions after your visit or need to cancel or reschedule your appointment, please contact [facility] [phone]  and follow the prompts.  Office hours are 8:00 a.m. to 4:30 p.m. Monday - Friday. Please note that voicemails left after 4:00 p.m. may not be returned until the following business day.  We are closed weekends and major holidays. You have access to a nurse at all times for urgent questions. Please call the main number to the clinic [phone] and follow the prompts.  For any non-urgent questions, you may also contact your provider using MyChart. We now offer e-Visits for anyone 76 and older to request care online for non-urgent symptoms. For details visit [email or web address].   Also download the MyChart app! Go to the app store, search "MyChart", open the app, select , and log in with your MyChart username and password.  Due to Covid, a mask is required upon entering the hospital/clinic. If you do not have a mask, one will be given to you upon arrival. For doctor visits, patients may have 1 support person aged 31 or older with them. For treatment visits, patients cannot have anyone with them due to current Covid guidelines and our immunocompromised population.

## 2021-03-26 ENCOUNTER — Other Ambulatory Visit: Payer: Self-pay

## 2021-03-26 ENCOUNTER — Ambulatory Visit (HOSPITAL_COMMUNITY)
Admission: RE | Admit: 2021-03-26 | Discharge: 2021-03-26 | Disposition: A | Discharge status: Home / Self Care | Payer: Medicaid Other | Source: Ambulatory Visit | Attending: Hematology | Admitting: Hematology

## 2021-03-26 DIAGNOSIS — C541 Malignant neoplasm of endometrium: Secondary | ICD-10-CM | POA: Diagnosis not present

## 2021-03-26 MED ORDER — IOHEXOL 300 MG/ML  SOLN
100.0000 mL | Freq: Once | INTRAMUSCULAR | Status: AC | PRN
  Administered 2021-03-26: 100 mL via INTRAVENOUS

## 2021-03-28 ENCOUNTER — Inpatient Hospital Stay (HOSPITAL_COMMUNITY): Payer: Medicaid Other

## 2021-03-28 ENCOUNTER — Other Ambulatory Visit: Payer: Self-pay

## 2021-03-28 ENCOUNTER — Inpatient Hospital Stay (HOSPITAL_COMMUNITY): Payer: Medicaid Other | Attending: Hematology | Admitting: Hematology

## 2021-03-28 VITALS — BP 132/75 | HR 65 | Temp 97.8°F | Resp 18

## 2021-03-28 VITALS — BP 141/74 | HR 56 | Temp 96.2°F | Resp 18 | Ht 63.39 in | Wt 236.3 lb

## 2021-03-28 DIAGNOSIS — Z5111 Encounter for antineoplastic chemotherapy: Secondary | ICD-10-CM | POA: Diagnosis not present

## 2021-03-28 DIAGNOSIS — I10 Essential (primary) hypertension: Secondary | ICD-10-CM | POA: Diagnosis not present

## 2021-03-28 DIAGNOSIS — C541 Malignant neoplasm of endometrium: Secondary | ICD-10-CM

## 2021-03-28 DIAGNOSIS — Z90710 Acquired absence of both cervix and uterus: Secondary | ICD-10-CM | POA: Insufficient documentation

## 2021-03-28 DIAGNOSIS — G629 Polyneuropathy, unspecified: Secondary | ICD-10-CM | POA: Insufficient documentation

## 2021-03-28 DIAGNOSIS — C786 Secondary malignant neoplasm of retroperitoneum and peritoneum: Secondary | ICD-10-CM | POA: Insufficient documentation

## 2021-03-28 DIAGNOSIS — Z79899 Other long term (current) drug therapy: Secondary | ICD-10-CM | POA: Insufficient documentation

## 2021-03-28 DIAGNOSIS — Z95828 Presence of other vascular implants and grafts: Secondary | ICD-10-CM

## 2021-03-28 LAB — CBC WITH DIFFERENTIAL/PLATELET
Abs Immature Granulocytes: 0.03 10*3/uL (ref 0.00–0.07)
Basophils Absolute: 0.1 10*3/uL (ref 0.0–0.1)
Basophils Relative: 1 %
Eosinophils Absolute: 0.1 10*3/uL (ref 0.0–0.5)
Eosinophils Relative: 2 %
HCT: 36.7 % (ref 36.0–46.0)
Hemoglobin: 11.5 g/dL — ABNORMAL LOW (ref 12.0–15.0)
Immature Granulocytes: 1 %
Lymphocytes Relative: 32 %
Lymphs Abs: 2.1 10*3/uL (ref 0.7–4.0)
MCH: 26.6 pg (ref 26.0–34.0)
MCHC: 31.3 g/dL (ref 30.0–36.0)
MCV: 84.8 fL (ref 80.0–100.0)
Monocytes Absolute: 0.7 10*3/uL (ref 0.1–1.0)
Monocytes Relative: 10 %
Neutro Abs: 3.5 10*3/uL (ref 1.7–7.7)
Neutrophils Relative %: 54 %
Platelets: 307 10*3/uL (ref 150–400)
RBC: 4.33 MIL/uL (ref 3.87–5.11)
RDW: 17 % — ABNORMAL HIGH (ref 11.5–15.5)
WBC: 6.5 10*3/uL (ref 4.0–10.5)
nRBC: 0 % (ref 0.0–0.2)

## 2021-03-28 LAB — COMPREHENSIVE METABOLIC PANEL
ALT: 21 U/L (ref 0–44)
AST: 17 U/L (ref 15–41)
Albumin: 3.8 g/dL (ref 3.5–5.0)
Alkaline Phosphatase: 105 U/L (ref 38–126)
Anion gap: 9 (ref 5–15)
BUN: 15 mg/dL (ref 6–20)
CO2: 25 mmol/L (ref 22–32)
Calcium: 9.1 mg/dL (ref 8.9–10.3)
Chloride: 105 mmol/L (ref 98–111)
Creatinine, Ser: 0.68 mg/dL (ref 0.44–1.00)
GFR, Estimated: >60 mL/min (ref >60)
Glucose, Bld: 107 mg/dL — ABNORMAL HIGH (ref 70–99)
Potassium: 4 mmol/L (ref 3.5–5.1)
Sodium: 139 mmol/L (ref 135–145)
Total Bilirubin: 0.2 mg/dL — ABNORMAL LOW (ref 0.3–1.2)
Total Protein: 7 g/dL (ref 6.5–8.1)

## 2021-03-28 LAB — MAGNESIUM: Magnesium: 1.8 mg/dL (ref 1.7–2.4)

## 2021-03-28 MED ORDER — SODIUM CHLORIDE 0.9 % IV SOLN: Freq: Once | INTRAVENOUS | Status: AC

## 2021-03-28 MED ORDER — DIPHENHYDRAMINE HCL 50 MG/ML IJ SOLN
25.0000 mg | Freq: Once | INTRAMUSCULAR | Status: AC
  Administered 2021-03-28: 10:00:00 25 mg via INTRAVENOUS
  Filled 2021-03-28: qty 1

## 2021-03-28 MED ORDER — FAMOTIDINE IN NACL 20-0.9 MG/50ML-% IV SOLN
20.0000 mg | Freq: Once | INTRAVENOUS | Status: AC
  Administered 2021-03-28: 10:00:00 20 mg via INTRAVENOUS
  Filled 2021-03-28: qty 50

## 2021-03-28 MED ORDER — HEPARIN SOD (PORK) LOCK FLUSH 100 UNIT/ML IV SOLN
500.0000 [IU] | Freq: Once | INTRAVENOUS | Status: AC | PRN
  Administered 2021-03-28: 14:00:00 500 [IU]

## 2021-03-28 MED ORDER — PALONOSETRON HCL INJECTION 0.25 MG/5ML
0.2500 mg | Freq: Once | INTRAVENOUS | Status: AC
  Administered 2021-03-28: 10:00:00 0.25 mg via INTRAVENOUS
  Filled 2021-03-28: qty 5

## 2021-03-28 MED ORDER — SODIUM CHLORIDE 0.9% FLUSH
10.0000 mL | INTRAVENOUS | Status: DC | PRN
  Administered 2021-03-28: 14:00:00 10 mL

## 2021-03-28 MED ORDER — SODIUM CHLORIDE 0.9 % IV SOLN
10.0000 mg | Freq: Once | INTRAVENOUS | Status: AC
  Administered 2021-03-28: 10:00:00 10 mg via INTRAVENOUS
  Filled 2021-03-28: qty 10

## 2021-03-28 MED ORDER — SODIUM CHLORIDE 0.9 % IV SOLN
140.0000 mg/m2 | Freq: Once | INTRAVENOUS | Status: AC
  Administered 2021-03-28: 306 mg via INTRAVENOUS
  Filled 2021-03-28: qty 51

## 2021-03-28 NOTE — Progress Notes (Signed)
[place], [place] [address]   CLINIC:  Medical Oncology/Hematology  PCP:  Renee Rival, FNP [address] [place] / [employer] [place] [address] [phone]   REASON FOR VISIT:  Follow-up for recurrent endometrial cancer  PRIOR THERAPY: TAH & BSO, bilateral pelvic and para-aortic lymph nodes dissections on 04/13/2018  NGS Results: not done  CURRENT THERAPY: Paclitaxel & Aloxi every 3 weeks  BRIEF ONCOLOGIC HISTORY:  Oncology History  Endometrial cancer ([facility])  03/31/2018 Initial Diagnosis   Endometrial cancer ([place])   12/29/2018 -  Chemotherapy   Patient is on Treatment Plan : UTERINE Carboplatin AUC 6 / Paclitaxel q21d     06/06/2020 Miscellaneous   Summary of oncologic history from Dr. Delton Coombes   1.  Recurrent endometrial carcinosarcoma: -TAH, BSO, bilateral pelvic and para-aortic lymph node resections on 04/13/2018. -Pathology showed carcinosarcoma (malignant mixed mullerian tumor) is arising in an endometrial type polyp with no myometrial invasion identified.  High-grade.  0/39 lymph nodes positive.  PT1APN0, FIGO stage Ia.  MMR normal.  MSI-stable. -CTAP on 12/09/2018 for abdominal pain showed extensive peritoneal carcinomatosis and large soft tissue mass in the pelvis. -Biopsy of the omental mass on 12/28/2018 shows poorly differentiated carcinoma consistent with her prior malignancy. -Carboplatin and paclitaxel started on 12/29/2018. -CT scan on 05/02/2019 showed peritoneal implants in the low central small bowel mesentery and left pelvic sidewall have decreased in size measuring 1.5 x 1.9 cm and 1.7 x 2.2 cm.  Soft tissue nodule in the left lateral omentum measures 1 x 1.7 cm with no evidence of metastatic disease. -Continuation of chemotherapy until complete response was recommended. -CT AP on 07/04/2019 showed continued positive response to therapy with scattered peritoneal metastasis in the pelvis and left omentum  decreased in size.  No new metastatic disease. -CTAP on 10/24/2019 after 14 cycles showed substantial reduction in size of soft tissue nodules in the pelvis.  1.4 x 0.9 x 1 cm soft tissue density in the inferior serosal margin of the sigmoid colon, previously 1.7 x 1.1 x 1.7 cm.  2 small soft tissue nodules in the mesenteric adipose tissue above the urinary bladder measures 0.8 cm. -She has developed serious allergic reaction during cycle 14 with carboplatin. -I have discussed with Dr. Denman George.  Other options include adding ifosfamide to Taxol or Doxil and combination of lenvatinib with pembrolizumab.  In the lenvatinib pembrolizumab trial carcinosarcomas were not included. -Single agent paclitaxel started on 11/09/2019, dose reduced by 20% on 01/18/2020. -CTAP on 01/10/2020 with stable small peritoneal soft tissue nodules in the pelvis.  Resolution of soft tissue nodularity in the left abdominal omental fat with no new or progressive disease.     06/20/2020 Imaging   1. Stable examination status post hysterectomy without suspicious enhancing soft tissue nodularity at the vaginal cuff and no significant change in the multiple small soft tissue nodules in the pelvis likely representing treated tumor. No new or progressive findings. 2. Cholelithiasis including a large gallstone in the neck of the gallbladder but without CT evidence of acute cholecystitis. 3. Small hiatal hernia. 4. Aortic atherosclerosis.       CANCER STAGING:  Cancer Staging  No matching staging information was found for the patient.  INTERVAL HISTORY:  [name], a 61 y.o. female, returns for routine follow-up and consideration for next cycle of chemotherapy. Jessica [name] was last seen on 02/14/2021.  Due for cycle #37 of Taxol today.   Overall, she tells  me she has been feeling pretty well. She reports stable pain in her legs and neuropathy. She denies new pains and abdominal pain. She denies recent falls or difficulty with  balance. She continues to work part time. She denies recent infections.   Overall, she feels ready for next cycle of chemo today.   REVIEW OF SYSTEMS:  Review of Systems  Constitutional:  Negative for appetite change and fatigue.  Respiratory:  Positive for shortness of breath.   Gastrointestinal:  Positive for diarrhea and nausea. Negative for abdominal pain.  Musculoskeletal:  Positive for arthralgias (hip and legs - stable) and back pain. Negative for gait problem.  Neurological:  Positive for headaches and numbness (feet - stable). Negative for gait problem.  All other systems reviewed and are negative.  PAST MEDICAL/SURGICAL HISTORY:  Past Medical History:  Diagnosis Date   Cancer ([facility])    Phreesia 09/05/2019   Elevated blood pressure reading    Endometrial cancer ([facility])    Hyperlipidemia    Hypertension    PMB (postmenopausal bleeding)    Port-A-Cath in place 12/24/2018   Past Surgical History:  Procedure Laterality Date   ABDOMINAL HYSTERECTOMY N/A    Phreesia 09/05/2019   IR IMAGING GUIDED PORT INSERTION  12/28/2018   IR RADIOLOGIST EVAL & MGMT  06/02/2018   IR RADIOLOGIST EVAL & MGMT  06/16/2018   NECK SURGERY  2000   spinal surgery , titanium rod in place    ROBOTIC ASSISTED TOTAL HYSTERECTOMY WITH BILATERAL SALPINGO OOPHERECTOMY N/A 04/13/2018   Procedure: XI ROBOTIC [place];  Surgeon: Everitt Amber, MD;  Location: [facility];  Service: Gynecology;  Laterality: N/A;   ROBOTIC PELVIC AND PARA-AORTIC LYMPH NODE DISSECTION N/A 04/13/2018   Procedure: XI ROBOTIC PELVIC LYMPHADECTOMY AND PARA-AORTIC LYMPH NODE DISSECTION;  Surgeon: Everitt Amber, MD;  Location: [facility];  Service: Gynecology;  Laterality: N/A;    SOCIAL HISTORY:  Social History   Socioeconomic History   Marital status: Widowed    Spouse name: Not on file   Number of children: 3   Years of education: Not on file   Highest education level: Not on file   Occupational History   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  Tobacco Use   Smoking status: Never   Smokeless tobacco: Never  Vaping Use   Vaping Use: Never used  Substance and Sexual Activity   Alcohol use: Yes    Alcohol/week: 4.0 standard drinks    Types: 4 Glasses of wine per week    Comment: weekends   Drug use: Never   Sexual activity: Not Currently  Other Topics Concern   Not on file  Social History Narrative   Lives with daughter and granddaughter here in [place], moved 3 years ago      Cats: Zoe-68 years old, Eduard Clos, and Storm      Enjoys: walking when she can tolerate- spending time with granddaughter (her life)      Diet: eats all food groups   Caffeine: 1-2 cups at times, but does not tolerate it all the time   Water: 160 oz daily      Wears seat belt    Does not use phone while driving    Oceanographer at home   Fire extinguisher-no   No weapons   Social Determinants of Health   Financial Resource Strain: Not on file  Food Insecurity: Not on file  Transportation Needs: Not on file  Physical  Activity: Not on file  Stress: Not on file  Social Connections: Not on file  Intimate Partner Violence: Not on file    FAMILY HISTORY:  Family History  Problem Relation Age of Onset   Hypertension Father    Heart disease Father    Cancer Father        lung   Hypertension Mother    Cancer Sister        ovarian cancer    CURRENT MEDICATIONS:  Current Outpatient Medications  Medication Sig Dispense Refill   albuterol (VENTOLIN HFA) 108 (90 Base) MCG/ACT inhaler Inhale 2 puffs into the lungs every 6 (six) hours as needed for wheezing or shortness of breath. 8 g 2   amLODipine (NORVASC) 5 MG tablet Take 1 tablet (5 mg total) by mouth daily. 90 tablet 1   atorvastatin (LIPITOR) 20 MG tablet Take 1 tablet (20 mg total) by mouth daily. 90 tablet 1   benzonatate (TESSALON) 100 MG capsule Take 1 capsule (100 mg total) by mouth 2 (two) times daily as  needed for cough. 20 capsule 0   cyclobenzaprine (FLEXERIL) 5 MG tablet Take 1 tablet (5 mg total) by mouth 3 (three) times daily as needed for muscle spasms. 30 tablet 1   EPINEPHrine 0.3 mg/0.3 mL IJ SOAJ injection Inject 0.3 mg into the muscle as needed for anaphylaxis. 1 each 0   gabapentin (NEURONTIN) 300 MG capsule Take 1 capsule (300 mg total) by mouth 3 (three) times daily. 90 capsule 6   ibuprofen (ADVIL) 600 MG tablet Take 1 tablet (600 mg total) by mouth 2 (two) times daily as needed. 30 tablet 0   lidocaine-prilocaine (EMLA) cream Apply to port site prior to use (Patient taking differently: 1 application. daily as needed (apply to port site prior to chemo).) 30 g 0   magnesium oxide (MAG-OX) 400 (240 Mg) MG tablet Take 1 tablet (400 mg total) by mouth 2 (two) times daily. 60 tablet 6   oxyCODONE-acetaminophen (PERCOCET) 5-325 MG tablet Take 1 tablet by mouth every 12 (twelve) hours as needed (pain). 60 tablet 0   PACLITAXEL IV Inject into the vein every 21 ( twenty-one) days.     prochlorperazine (COMPAZINE) 10 MG tablet Take 1 tablet (10 mg total) by mouth every 6 (six) hours as needed (Nausea or vomiting). 60 tablet 3   sertraline (ZOLOFT) 100 MG tablet Take 1 tablet (100 mg total) by mouth daily. 30 tablet 3   No current facility-administered medications for this visit.   Facility-Administered Medications Ordered in Other Visits  Medication Dose Route Frequency Provider Last Rate Last Admin   diphenhydrAMINE (BENADRYL) 50 MG/ML injection            palonosetron (ALOXI) 0.25 MG/5ML injection             ALLERGIES:  Allergies  Allergen Reactions   Carboplatin Other (See Comments)    "body on fire and abdominal pain"   Nickel Rash    itchy    PHYSICAL EXAM:  Performance status (ECOG): 1 - Symptomatic but completely ambulatory  Vitals:   03/28/21 0809  BP: (!) 141/74  Pulse: (!) 56  Resp: 18  Temp: (!) 96.2 F (35.7 C)  SpO2: 98%   Wt Readings from Last 3  Encounters:  03/28/21 236 lb 5.3 oz (107.2 kg)  03/07/21 235 lb 3.7 oz (106.7 kg)  02/14/21 236 lb 6.4 oz (107.2 kg)   Physical Exam Vitals reviewed.  Constitutional:      Appearance:  Normal appearance. She is obese.  Cardiovascular:     Rate and Rhythm: Normal rate and regular rhythm.     Pulses: Normal pulses.     Heart sounds: Normal heart sounds.  Pulmonary:     Effort: Pulmonary effort is normal.     Breath sounds: Normal breath sounds.  Abdominal:     Palpations: Abdomen is soft. There is no mass.     Tenderness: There is no abdominal tenderness.  Neurological:     General: No focal deficit present.     Mental Status: She is alert and oriented to person, place, and time.  Psychiatric:        Mood and Affect: Mood normal.        Behavior: Behavior normal.    LABORATORY DATA:  I have reviewed the labs as listed.  CBC Latest Ref Rng & Units 03/28/2021 03/07/2021 02/14/2021  WBC 4.0 - 10.5 K/uL 6.5 7.4 7.1  Hemoglobin 12.0 - 15.0 g/dL 11.5(L) 11.7(L) 11.5(L)  Hematocrit 36.0 - 46.0 % 36.7 37.2 35.6(L)  Platelets 150 - 400 K/uL 307 291 253   CMP Latest Ref Rng & Units 03/28/2021 03/07/2021 02/14/2021  Glucose 70 - 99 mg/dL 107(H) 123(H) 112(H)  BUN 6 - 20 mg/dL _0 Creatinine 0.44 - 1.00 mg/dL 0.68 0.68 0.67  Sodium 135 - 145 mmol/L 139 139 139  Potassium 3.5 - 5.1 mmol/L 4.0 3.7 3.8  Chloride 98 - 111 mmol/L 105 104 106  CO2 22 - 32 mmol/L _1 Calcium 8.9 - 10.3 mg/dL 9.1 9.1 9.1  Total Protein 6.5 - 8.1 g/dL 7.0 6.7 6.6  Total Bilirubin 0.3 - 1.2 mg/dL 0.2(L) 0.4 0.1(L)  Alkaline Phos 38 - 126 U/L 105 106 99  AST 15 - 41 U/L _2 ALT 0 - 44 U/L _3 DIAGNOSTIC IMAGING:  I have independently reviewed the scans and discussed with the patient. CT CHEST ABDOMEN PELVIS W CONTRAST  Result Date: 03/27/2021 CLINICAL DATA:  61 year old female with history of recurrent endometrial cancer, currently undergoing chemotherapy. Follow-up study. * onc * EXAM:  CT CHEST, ABDOMEN, AND PELVIS WITH CONTRAST TECHNIQUE: Multidetector CT imaging of the chest, abdomen and pelvis was performed following the standard protocol during bolus administration of intravenous contrast. RADIATION DOSE REDUCTION: This exam was performed according to the departmental dose-optimization program which includes automated exposure control, adjustment of the mA and/or kV according to patient size and/or use of iterative reconstruction technique. CONTRAST:  173m OMNIPAQUE IOHEXOL 300 MG/ML  SOLN COMPARISON:  CT of the abdomen and pelvis 12/17/2020. Chest CT 05/02/2019. Multiple other priors. FINDINGS: CT CHEST FINDINGS Cardiovascular: Heart size is normal. There is no significant pericardial fluid, thickening or pericardial calcification. There is aortic atherosclerosis, as well as atherosclerosis of the great vessels of the mediastinum and the coronary arteries, including calcified atherosclerotic plaque in the left anterior descending coronary artery. Right internal jugular single-lumen porta cath with tip terminating in the distal superior vena cava. Mediastinum/Nodes: No pathologically enlarged mediastinal or hilar lymph nodes. Small hiatal hernia. No axillary lymphadenopathy. Lungs/Pleura: No suspicious appearing pulmonary nodules or masses are noted. No acute consolidative airspace disease. No pleural effusions. Musculoskeletal: Orthopedic fixation hardware in the lower cervical spine incidentally noted. There are no aggressive appearing lytic or blastic lesions noted in the visualized portions of the skeleton. CT ABDOMEN PELVIS FINDINGS Hepatobiliary: No suspicious cystic or solid hepatic lesions. No intra or extrahepatic biliary ductal dilatation. Peripherally  calcified gallstone in the neck of the gallbladder measuring 1.7 cm in diameter. Gallbladder is moderately distended. There is some subtle intermediate attenuation material lying dependently in the lumen of the gallbladder, which  likely represents a trace amount of biliary sludge. No gallbladder wall thickening. No pericholecystic fluid or surrounding inflammatory changes. Pancreas: No pancreatic mass. No pancreatic ductal dilatation. No pancreatic or peripancreatic fluid collections or inflammatory changes. Spleen: Unremarkable. Adrenals/Urinary Tract: Bilateral kidneys and bilateral adrenal glands are normal in appearance. No hydroureteronephrosis. Urinary bladder is normal in appearance. Stomach/Bowel: The appearance of the stomach is normal. There is no pathologic dilatation of small bowel or colon. Normal appendix. Vascular/Lymphatic: Aortic atherosclerosis, without evidence of aneurysm or dissection in the abdominal or pelvic vasculature. No lymphadenopathy noted in the abdomen or pelvis. Reproductive: Status post total abdominal hysterectomy and bilateral salpingo-oophorectomy. Other: Tiny soft tissue attenuation nodules are again noted in the pelvis best appreciated on axial image 103 of series 2 where the largest of these adjacent to the sigmoid colon measures 1.0 x 0.8 cm, stable. No other suspicious soft tissue implants are noted elsewhere in the peritoneal cavity. No significant volume of ascites. No pneumoperitoneum. Musculoskeletal: There are no aggressive appearing lytic or blastic lesions noted in the visualized portions of the skeleton. IMPRESSION: 1. Stable examination demonstrating tiny soft tissue nodules in the low anatomic pelvis which are unchanged compared to prior examinations. No other definitive findings to suggest metastatic disease elsewhere in the chest, abdomen or pelvis. 2. Cholelithiasis without evidence of acute cholecystitis at this time. 3. Aortic atherosclerosis, in addition to left anterior descending coronary artery disease. Please note that although the presence of coronary artery calcium documents the presence of coronary artery disease, the severity of this disease and any potential stenosis cannot  be assessed on this non-gated CT examination. Assessment for potential risk factor modification, dietary therapy or pharmacologic therapy may be warranted, if clinically indicated. 4. Additional incidental findings, as above. Electronically Signed   By: Vinnie Langton M.D.   On: 03/27/2021 06:56     ASSESSMENT:  1.  Recurrent endometrial carcinosarcoma: -TAH, BSO, bilateral pelvic and para-aortic lymph node resections on 04/13/2018. -Pathology showed carcinosarcoma (malignant mixed mullerian tumor) is arising in an endometrial type polyp with no myometrial invasion identified.  High-grade.  0/39 lymph nodes positive.  PT1APN0, FIGO stage Ia.  MMR normal.  MSI-stable. -CTAP on 12/09/2018 for abdominal pain showed extensive peritoneal carcinomatosis and large soft tissue mass in the pelvis. -Biopsy of the omental mass on 12/28/2018 shows poorly differentiated carcinoma consistent with her prior malignancy. -Carboplatin and paclitaxel started on 12/29/2018. -CT scan on 05/02/2019 showed peritoneal implants in the low central small bowel mesentery and left pelvic sidewall have decreased in size measuring 1.5 x 1.9 cm and 1.7 x 2.2 cm.  Soft tissue nodule in the left lateral omentum measures 1 x 1.7 cm with no evidence of metastatic disease. -Continuation of chemotherapy until complete response was recommended. -CT AP on 07/04/2019 showed continued positive response to therapy with scattered peritoneal metastasis in the pelvis and left omentum decreased in size.  No new metastatic disease. -CTAP on 10/24/2019 after 14 cycles showed substantial reduction in size of soft tissue nodules in the pelvis.  1.4 x 0.9 x 1 cm soft tissue density in the inferior serosal margin of the sigmoid colon, previously 1.7 x 1.1 x 1.7 cm.  2 small soft tissue nodules in the mesenteric adipose tissue above the urinary bladder measures 0.8 cm. -She has developed  serious allergic reaction during cycle 14 with carboplatin. -I have  discussed with Dr. Denman George.  Other options include adding ifosfamide to Taxol or Doxil and combination of lenvatinib with pembrolizumab.  In the lenvatinib pembrolizumab trial carcinosarcomas were not included. -Single agent paclitaxel started on 11/09/2019, dose reduced by 20% on 01/18/2020. -CTAP on 01/10/2020 with stable small peritoneal soft tissue nodules in the pelvis.  Resolution of soft tissue nodularity in the left abdominal omental fat with no new or progressive disease.   2.  Breast abnormality: -Mammogram on 05/02/2019 shows BI-RADS Category 0.  She reportedly took Covid shot 1 to 2 weeks prior to the mammogram.  Further scans rescheduled on 06/21/2019.   3.  Peripheral neuropathy: -She reported numbness and occasional pins-and-needles sensation in the bilateral toes, right more than left.  This has started about a week ago.   PLAN:  1.  Recurrent endometrial carcinosarcoma: - Reviewed CT CAP from 03/27/2021.  Stable exam with tiny soft tissue nodules in the lower anatomic pelvis unchanged.  No definitive findings of metastatic disease.  Cholelithiasis without evidence of acute cholecystitis. - Reviewed labs today which showed normal CBC.  LFTs are within normal limits.  Electrolytes and creatinine are normal.  CA125 is 7.7. - She is continuing to tolerate single agent paclitaxel very well.  She will continue treatment today and in 3 weeks.  RTC 6 weeks for follow-up.   2.  Low back pain/right-sided abdominal pain: - She has on and off bilateral hip pains.  Continue oxycodone 5 mg 2-3 times weekly as needed.   3.  Hypertension: - Continue Norvasc 5 mg daily.   4.  Neuropathy: - Bilateral feet numbness and tingling, right more than left. - Continue gabapentin 600 mg at bedtime.   5.  Abnormal mammogram: - Abnormal mammogram on 05/15/2020, status postbiopsy which was benign. - Another mammogram is due in April.  6.  Hypomagnesemia: - Continue magnesium 1 tablet daily.  Magnesium is  normal.   Orders placed this encounter:  No orders of the defined types were placed in this encounter.    [name], MD [place] [phone]   I, Thana Ates, am acting as a scribe for Dr. [name].  I, [name] MD, have reviewed the above documentation for accuracy and completeness, and I agree with the above.

## 2021-03-28 NOTE — Progress Notes (Signed)
Patient has been examined by Dr. Katragadda, and vital signs and labs have been reviewed. ANC, Creatinine, LFTs, hemoglobin, and platelets are within treatment parameters per M.D. - pt may proceed with treatment.    °

## 2021-03-28 NOTE — Progress Notes (Signed)
Patient presents today for treatment, patient okay for treatment today per Dr. Delton Coombes. Patient tolerated chemotherapy with no complaints voiced. Side effects with management reviewed understanding verbalized. Port site clean and dry with no bruising or swelling noted at site. Good blood return noted before and after administration of chemotherapy. Band aid applied. Patient left in satisfactory condition with VSS and no s/s of distress noted.  ?

## 2021-03-28 NOTE — Patient Instructions (Signed)
[place] at [facility] ?Discharge Instructions ? ? ?You were seen and examined today by Dr. Delton Coombes. ? ?He reviewed the results of you lab work which are normal/stable.  ? ?We will proceed with your treatment today. ? ?Return as scheduled.  ? ? ?Thank you for choosing [place] at [facility] to provide your oncology and hematology care.  To afford each patient quality time with our provider, please arrive at least 15 minutes before your scheduled appointment time.  ? ?If you have a lab appointment with the [facility] please come in thru the Main Entrance and check in at the main information desk. ? ?You need to re-schedule your appointment should you arrive 10 or more minutes late.  We strive to give you quality time with our providers, and arriving late affects you and other patients whose appointments are after yours.  Also, if you no show three or more times for appointments you may be dismissed from the clinic at the providers discretion.     ?Again, thank you for choosing [facility].  Our hope is that these requests will decrease the amount of time that you wait before being seen by our physicians.       ?_____________________________________________________________ ? ?Should you have questions after your visit to [facility], please contact our office at [phone] and follow the prompts.  Our office hours are 8:00 a.m. and 4:30 p.m. Monday - Friday.  Please note that voicemails left after 4:00 p.m. may not be returned until the following business day.  We are closed weekends and major holidays.  You do have access to a nurse 24-7, just call the main number to the clinic [phone] and do not press any options, hold on the line and a nurse will answer the phone.   ? ?For prescription refill requests, have your pharmacy contact our office and allow 72 hours.   ? ?Due to Covid, you will need to wear a mask upon entering the  hospital. If you do not have a mask, a mask will be given to you at the Main Entrance upon arrival. For doctor visits, patients may have 1 support person age 16 or older with them. For treatment visits, patients can not have anyone with them due to social distancing guidelines and our immunocompromised population.  ? ?   ?

## 2021-03-28 NOTE — Patient Instructions (Signed)
[facility]  Discharge Instructions: ?Thank you for choosing [facility] to provide your oncology and hematology care.  ?If you have a lab appointment with the [facility], please come in thru the Main Entrance and check in at the main information desk. ? ?Wear comfortable clothing and clothing appropriate for easy access to any Portacath or PICC line.  ? ?We strive to give you quality time with your provider. You may need to reschedule your appointment if you arrive late (15 or more minutes).  Arriving late affects you and other patients whose appointments are after yours.  Also, if you miss three or more appointments without notifying the office, you may be dismissed from the clinic at the provider?s discretion.    ?  ?For prescription refill requests, have your pharmacy contact our office and allow 72 hours for refills to be completed.   ? ?Today you received the following chemotherapy and/or immunotherapy agents Taxol, return as scheduled. ?  ?To help prevent nausea and vomiting after your treatment, we encourage you to take your nausea medication as directed. ? ?BELOW ARE SYMPTOMS THAT SHOULD BE REPORTED IMMEDIATELY: ?*FEVER GREATER THAN 100.4 F (38 ?C) OR HIGHER ?*CHILLS OR SWEATING ?*NAUSEA AND VOMITING THAT IS NOT CONTROLLED WITH YOUR NAUSEA MEDICATION ?*UNUSUAL SHORTNESS OF BREATH ?*UNUSUAL BRUISING OR BLEEDING ?*URINARY PROBLEMS (pain or burning when urinating, or frequent urination) ?*BOWEL PROBLEMS (unusual diarrhea, constipation, pain near the anus) ?TENDERNESS IN MOUTH AND THROAT WITH OR WITHOUT PRESENCE OF ULCERS (sore throat, sores in mouth, or a toothache) ?UNUSUAL RASH, SWELLING OR PAIN  ?UNUSUAL VAGINAL DISCHARGE OR ITCHING  ? ?Items with * indicate a potential emergency and should be followed up as soon as possible or go to the Emergency Department if any problems should occur. ? ?Please show the CHEMOTHERAPY ALERT CARD or IMMUNOTHERAPY ALERT CARD at check-in to the  Emergency Department and triage nurse. ? ?Should you have questions after your visit or need to cancel or reschedule your appointment, please contact [facility] [phone]  and follow the prompts.  Office hours are 8:00 a.m. to 4:30 p.m. Monday - Friday. Please note that voicemails left after 4:00 p.m. may not be returned until the following business day.  We are closed weekends and major holidays. You have access to a nurse at all times for urgent questions. Please call the main number to the clinic [phone] and follow the prompts. ? ?For any non-urgent questions, you may also contact your provider using MyChart. We now offer e-Visits for anyone 59 and older to request care online for non-urgent symptoms. For details visit [email or web address]. ?  ?Also download the MyChart app! Go to the app store, search "MyChart", open the app, select [facility], and log in with your MyChart username and password. ? ?Due to Covid, a mask is required upon entering the hospital/clinic. If you do not have a mask, one will be given to you upon arrival. For doctor visits, patients may have 1 support person aged 2 or older with them. For treatment visits, patients cannot have anyone with them due to current Covid guidelines and our immunocompromised population.  ?

## 2021-03-29 LAB — CA 125: Cancer Antigen (CA) 125: 6.7 U/mL (ref 0.0–38.1)

## 2021-04-18 ENCOUNTER — Inpatient Hospital Stay (HOSPITAL_COMMUNITY): Payer: Medicaid Other

## 2021-04-18 VITALS — BP 128/71 | HR 68 | Temp 99.2°F | Resp 18

## 2021-04-18 DIAGNOSIS — Z5111 Encounter for antineoplastic chemotherapy: Secondary | ICD-10-CM | POA: Diagnosis not present

## 2021-04-18 DIAGNOSIS — C541 Malignant neoplasm of endometrium: Secondary | ICD-10-CM

## 2021-04-18 DIAGNOSIS — Z95828 Presence of other vascular implants and grafts: Secondary | ICD-10-CM

## 2021-04-18 LAB — COMPREHENSIVE METABOLIC PANEL
ALT: 20 U/L (ref 0–44)
AST: 20 U/L (ref 15–41)
Albumin: 3.9 g/dL (ref 3.5–5.0)
Alkaline Phosphatase: 93 U/L (ref 38–126)
Anion gap: 8 (ref 5–15)
BUN: 22 mg/dL — ABNORMAL HIGH (ref 6–20)
CO2: 24 mmol/L (ref 22–32)
Calcium: 9.1 mg/dL (ref 8.9–10.3)
Chloride: 107 mmol/L (ref 98–111)
Creatinine, Ser: 0.69 mg/dL (ref 0.44–1.00)
GFR, Estimated: >60 mL/min (ref >60)
Glucose, Bld: 114 mg/dL — ABNORMAL HIGH (ref 70–99)
Potassium: 4 mmol/L (ref 3.5–5.1)
Sodium: 139 mmol/L (ref 135–145)
Total Bilirubin: 0.3 mg/dL (ref 0.3–1.2)
Total Protein: 7 g/dL (ref 6.5–8.1)

## 2021-04-18 LAB — CBC WITH DIFFERENTIAL/PLATELET
Abs Immature Granulocytes: 0.04 10*3/uL (ref 0.00–0.07)
Basophils Absolute: 0.1 10*3/uL (ref 0.0–0.1)
Basophils Relative: 1 %
Eosinophils Absolute: 0.2 10*3/uL (ref 0.0–0.5)
Eosinophils Relative: 2 %
HCT: 35.6 % — ABNORMAL LOW (ref 36.0–46.0)
Hemoglobin: 11.1 g/dL — ABNORMAL LOW (ref 12.0–15.0)
Immature Granulocytes: 1 %
Lymphocytes Relative: 27 %
Lymphs Abs: 2.2 10*3/uL (ref 0.7–4.0)
MCH: 26.3 pg (ref 26.0–34.0)
MCHC: 31.2 g/dL (ref 30.0–36.0)
MCV: 84.4 fL (ref 80.0–100.0)
Monocytes Absolute: 0.7 10*3/uL (ref 0.1–1.0)
Monocytes Relative: 8 %
Neutro Abs: 5 10*3/uL (ref 1.7–7.7)
Neutrophils Relative %: 61 %
Platelets: 295 10*3/uL (ref 150–400)
RBC: 4.22 MIL/uL (ref 3.87–5.11)
RDW: 17.2 % — ABNORMAL HIGH (ref 11.5–15.5)
WBC: 8.2 10*3/uL (ref 4.0–10.5)
nRBC: 0 % (ref 0.0–0.2)

## 2021-04-18 LAB — MAGNESIUM: Magnesium: 1.8 mg/dL (ref 1.7–2.4)

## 2021-04-18 MED ORDER — SODIUM CHLORIDE 0.9 % IV SOLN
10.0000 mg | Freq: Once | INTRAVENOUS | Status: AC
  Administered 2021-04-18: 10 mg via INTRAVENOUS
  Filled 2021-04-18: qty 10

## 2021-04-18 MED ORDER — FAMOTIDINE IN NACL 20-0.9 MG/50ML-% IV SOLN
20.0000 mg | Freq: Once | INTRAVENOUS | Status: AC
  Administered 2021-04-18: 20 mg via INTRAVENOUS
  Filled 2021-04-18: qty 50

## 2021-04-18 MED ORDER — PALONOSETRON HCL INJECTION 0.25 MG/5ML
0.2500 mg | Freq: Once | INTRAVENOUS | Status: AC
  Administered 2021-04-18: 0.25 mg via INTRAVENOUS
  Filled 2021-04-18: qty 5

## 2021-04-18 MED ORDER — SODIUM CHLORIDE 0.9 % IV SOLN: Freq: Once | INTRAVENOUS | Status: AC

## 2021-04-18 MED ORDER — DIPHENHYDRAMINE HCL 50 MG/ML IJ SOLN
25.0000 mg | Freq: Once | INTRAMUSCULAR | Status: AC
  Administered 2021-04-18: 25 mg via INTRAVENOUS
  Filled 2021-04-18: qty 1

## 2021-04-18 MED ORDER — HEPARIN SOD (PORK) LOCK FLUSH 100 UNIT/ML IV SOLN
500.0000 [IU] | Freq: Once | INTRAVENOUS | Status: AC | PRN
  Administered 2021-04-18: 500 [IU]

## 2021-04-18 MED ORDER — SODIUM CHLORIDE 0.9 % IV SOLN
140.0000 mg/m2 | Freq: Once | INTRAVENOUS | Status: AC
  Administered 2021-04-18: 306 mg via INTRAVENOUS
  Filled 2021-04-18: qty 51

## 2021-04-18 MED ORDER — SODIUM CHLORIDE 0.9% FLUSH
10.0000 mL | INTRAVENOUS | Status: DC | PRN
  Administered 2021-04-18: 10 mL

## 2021-04-18 NOTE — Patient Instructions (Signed)
[facility]  Discharge Instructions: Thank you for choosing [facility] to provide your oncology and hematology care.  If you have a lab appointment with the Cancer Center, please come in thru the Main Entrance and check in at the main information desk.  Wear comfortable clothing and clothing appropriate for easy access to any Portacath or PICC line.   We strive to give you quality time with your provider. You may need to reschedule your appointment if you arrive late (15 or more minutes).  Arriving late affects you and other patients whose appointments are after yours.  Also, if you miss three or more appointments without notifying the office, you may be dismissed from the clinic at the provider's discretion.      For prescription refill requests, have your pharmacy contact our office and allow 72 hours for refills to be completed.        To help prevent nausea and vomiting after your treatment, we encourage you to take your nausea medication as directed.  BELOW ARE SYMPTOMS THAT SHOULD BE REPORTED IMMEDIATELY: *FEVER GREATER THAN 100.4 F (38 C) OR HIGHER *CHILLS OR SWEATING *NAUSEA AND VOMITING THAT IS NOT CONTROLLED WITH YOUR NAUSEA MEDICATION *UNUSUAL SHORTNESS OF BREATH *UNUSUAL BRUISING OR BLEEDING *URINARY PROBLEMS (pain or burning when urinating, or frequent urination) *BOWEL PROBLEMS (unusual diarrhea, constipation, pain near the anus) TENDERNESS IN MOUTH AND THROAT WITH OR WITHOUT PRESENCE OF ULCERS (sore throat, sores in mouth, or a toothache) UNUSUAL RASH, SWELLING OR PAIN  UNUSUAL VAGINAL DISCHARGE OR ITCHING   Items with * indicate a potential emergency and should be followed up as soon as possible or go to the Emergency Department if any problems should occur.  Please show the CHEMOTHERAPY ALERT CARD or IMMUNOTHERAPY ALERT CARD at check-in to the Emergency Department and triage nurse.  Should you have questions after your visit or need to cancel  or reschedule your appointment, please contact [facility] [phone]  and follow the prompts.  Office hours are 8:00 a.m. to 4:30 p.m. Monday - Friday. Please note that voicemails left after 4:00 p.m. may not be returned until the following business day.  We are closed weekends and major holidays. You have access to a nurse at all times for urgent questions. Please call the main number to the clinic [phone] and follow the prompts.  For any non-urgent questions, you may also contact your provider using MyChart. We now offer e-Visits for anyone 18 and older to request care online for non-urgent symptoms. For details visit [email or web address].   Also download the MyChart app! Go to the app store, search "MyChart", open the app, select [facility], and log in with your MyChart username and password.  Due to Covid, a mask is required upon entering the hospital/clinic. If you do not have a mask, one will be given to you upon arrival. For doctor visits, patients may have 1 support person aged 18 or older with them. For treatment visits, patients cannot have anyone with them due to current Covid guidelines and our immunocompromised population.  

## 2021-04-18 NOTE — Progress Notes (Signed)
Patient presents today for chemotherapy infusion.  Patient is in satisfactory condition with no complaints voiced.  Vital signs are stable.  Labs reviewed.  All labs are within treatment parameters.  Port tip placement verified by las chest x-ray on 02/07/21.  We will proceed with treatment per MD orders.  ? ?Patient tolerated treatment well with no complaints voiced.  Patient left ambulatory in stable condition.  Vital signs stable at discharge.  Follow up as scheduled.    ?

## 2021-05-09 ENCOUNTER — Inpatient Hospital Stay (HOSPITAL_COMMUNITY): Payer: Medicaid Other

## 2021-05-09 ENCOUNTER — Inpatient Hospital Stay (HOSPITAL_COMMUNITY): Payer: Medicaid Other | Attending: Hematology | Admitting: Hematology

## 2021-05-09 VITALS — BP 129/75 | HR 69 | Temp 98.4°F | Resp 18

## 2021-05-09 DIAGNOSIS — C786 Secondary malignant neoplasm of retroperitoneum and peritoneum: Secondary | ICD-10-CM | POA: Diagnosis present

## 2021-05-09 DIAGNOSIS — C541 Malignant neoplasm of endometrium: Secondary | ICD-10-CM

## 2021-05-09 DIAGNOSIS — I10 Essential (primary) hypertension: Secondary | ICD-10-CM | POA: Diagnosis not present

## 2021-05-09 DIAGNOSIS — M25552 Pain in left hip: Secondary | ICD-10-CM | POA: Insufficient documentation

## 2021-05-09 DIAGNOSIS — M25551 Pain in right hip: Secondary | ICD-10-CM | POA: Diagnosis not present

## 2021-05-09 DIAGNOSIS — Z5111 Encounter for antineoplastic chemotherapy: Secondary | ICD-10-CM | POA: Insufficient documentation

## 2021-05-09 DIAGNOSIS — R109 Unspecified abdominal pain: Secondary | ICD-10-CM | POA: Diagnosis not present

## 2021-05-09 DIAGNOSIS — M545 Low back pain, unspecified: Secondary | ICD-10-CM | POA: Diagnosis not present

## 2021-05-09 DIAGNOSIS — R928 Other abnormal and inconclusive findings on diagnostic imaging of breast: Secondary | ICD-10-CM | POA: Diagnosis not present

## 2021-05-09 DIAGNOSIS — T451X5A Adverse effect of antineoplastic and immunosuppressive drugs, initial encounter: Secondary | ICD-10-CM | POA: Insufficient documentation

## 2021-05-09 DIAGNOSIS — Z79899 Other long term (current) drug therapy: Secondary | ICD-10-CM | POA: Insufficient documentation

## 2021-05-09 DIAGNOSIS — G620 Drug-induced polyneuropathy: Secondary | ICD-10-CM | POA: Diagnosis not present

## 2021-05-09 DIAGNOSIS — Z95828 Presence of other vascular implants and grafts: Secondary | ICD-10-CM

## 2021-05-09 DIAGNOSIS — Z90710 Acquired absence of both cervix and uterus: Secondary | ICD-10-CM | POA: Insufficient documentation

## 2021-05-09 LAB — COMPREHENSIVE METABOLIC PANEL
ALT: 26 U/L (ref 0–44)
AST: 22 U/L (ref 15–41)
Albumin: 3.9 g/dL (ref 3.5–5.0)
Alkaline Phosphatase: 118 U/L (ref 38–126)
Anion gap: 8 (ref 5–15)
BUN: 17 mg/dL (ref 6–20)
CO2: 23 mmol/L (ref 22–32)
Calcium: 9 mg/dL (ref 8.9–10.3)
Chloride: 107 mmol/L (ref 98–111)
Creatinine, Ser: 0.69 mg/dL (ref 0.44–1.00)
GFR, Estimated: >60 mL/min (ref >60)
Glucose, Bld: 110 mg/dL — ABNORMAL HIGH (ref 70–99)
Potassium: 3.7 mmol/L (ref 3.5–5.1)
Sodium: 138 mmol/L (ref 135–145)
Total Bilirubin: 0.4 mg/dL (ref 0.3–1.2)
Total Protein: 7.3 g/dL (ref 6.5–8.1)

## 2021-05-09 LAB — CBC WITH DIFFERENTIAL/PLATELET
Abs Immature Granulocytes: 0.04 10*3/uL (ref 0.00–0.07)
Basophils Absolute: 0.1 10*3/uL (ref 0.0–0.1)
Basophils Relative: 1 %
Eosinophils Absolute: 0.2 10*3/uL (ref 0.0–0.5)
Eosinophils Relative: 3 %
HCT: 35.8 % — ABNORMAL LOW (ref 36.0–46.0)
Hemoglobin: 11.5 g/dL — ABNORMAL LOW (ref 12.0–15.0)
Immature Granulocytes: 1 %
Lymphocytes Relative: 30 %
Lymphs Abs: 2.5 10*3/uL (ref 0.7–4.0)
MCH: 27 pg (ref 26.0–34.0)
MCHC: 32.1 g/dL (ref 30.0–36.0)
MCV: 84 fL (ref 80.0–100.0)
Monocytes Absolute: 0.9 10*3/uL (ref 0.1–1.0)
Monocytes Relative: 11 %
Neutro Abs: 4.7 10*3/uL (ref 1.7–7.7)
Neutrophils Relative %: 54 %
Platelets: 348 10*3/uL (ref 150–400)
RBC: 4.26 MIL/uL (ref 3.87–5.11)
RDW: 17.1 % — ABNORMAL HIGH (ref 11.5–15.5)
WBC: 8.4 10*3/uL (ref 4.0–10.5)
nRBC: 0 % (ref 0.0–0.2)

## 2021-05-09 LAB — MAGNESIUM: Magnesium: 1.9 mg/dL (ref 1.7–2.4)

## 2021-05-09 MED ORDER — SODIUM CHLORIDE 0.9 % IV SOLN
140.0000 mg/m2 | Freq: Once | INTRAVENOUS | Status: AC
  Administered 2021-05-09: 306 mg via INTRAVENOUS
  Filled 2021-05-09: qty 51

## 2021-05-09 MED ORDER — SODIUM CHLORIDE 0.9% FLUSH
10.0000 mL | INTRAVENOUS | Status: DC | PRN
  Administered 2021-05-09: 10 mL

## 2021-05-09 MED ORDER — OXYCODONE-ACETAMINOPHEN 5-325 MG PO TABS: 1.0000 | ORAL_TABLET | Freq: Two times a day (BID) | ORAL | 0 refills | Status: DC | PRN

## 2021-05-09 MED ORDER — DIPHENHYDRAMINE HCL 50 MG/ML IJ SOLN
25.0000 mg | Freq: Once | INTRAMUSCULAR | Status: AC
  Administered 2021-05-09: 25 mg via INTRAVENOUS
  Filled 2021-05-09: qty 1

## 2021-05-09 MED ORDER — HEPARIN SOD (PORK) LOCK FLUSH 100 UNIT/ML IV SOLN
500.0000 [IU] | Freq: Once | INTRAVENOUS | Status: AC | PRN
  Administered 2021-05-09: 500 [IU]

## 2021-05-09 MED ORDER — PALONOSETRON HCL INJECTION 0.25 MG/5ML
0.2500 mg | Freq: Once | INTRAVENOUS | Status: AC
  Administered 2021-05-09: 0.25 mg via INTRAVENOUS
  Filled 2021-05-09: qty 5

## 2021-05-09 MED ORDER — SODIUM CHLORIDE 0.9 % IV SOLN: Freq: Once | INTRAVENOUS | Status: AC

## 2021-05-09 MED ORDER — FAMOTIDINE IN NACL 20-0.9 MG/50ML-% IV SOLN
20.0000 mg | Freq: Once | INTRAVENOUS | Status: AC
  Administered 2021-05-09: 20 mg via INTRAVENOUS
  Filled 2021-05-09: qty 50

## 2021-05-09 MED ORDER — SODIUM CHLORIDE 0.9 % IV SOLN
10.0000 mg | Freq: Once | INTRAVENOUS | Status: AC
  Administered 2021-05-09: 10 mg via INTRAVENOUS
  Filled 2021-05-09: qty 1

## 2021-05-09 NOTE — Progress Notes (Signed)
Patient presents today for treatment and follow up visit with Dr. Delton Coombes. Vital signs within parameters for treatment. MAR reviewed and updated.  ? ?Labs reviewed and within parameters for treatment.  ? ?Treatment given today per MD orders. Tolerated infusion without adverse affects. Vital signs stable. No complaints at this time. Discharged from clinic ambulatory in stable condition. Alert and oriented x 3. F/U with [facility] as scheduled.   ?

## 2021-05-09 NOTE — Patient Instructions (Addendum)
[facility] at [facility] ?Discharge Instructions ? ? ?You were seen and examined today by Dr. Delton Coombes. ? ?He reviewed your lab work with you which is normal/stable. ? ?You can increase gabapentin to one in the morning or afternoon and 2 at bedtime.   ? ?We will proceed with your treatment today.  ? ?Return as scheduled.  ? ? ?Thank you for choosing [facility] at [facility] to provide your oncology and hematology care.  To afford each patient quality time with our provider, please arrive at least 15 minutes before your scheduled appointment time.  ? ?If you have a lab appointment with the [facility] please come in thru the Main Entrance and check in at the main information desk. ? ?You need to re-schedule your appointment should you arrive 10 or more minutes late.  We strive to give you quality time with our providers, and arriving late affects you and other patients whose appointments are after yours.  Also, if you no show three or more times for appointments you may be dismissed from the clinic at the providers discretion.     ?Again, thank you for choosing [facility].  Our hope is that these requests will decrease the amount of time that you wait before being seen by our physicians.       ?_____________________________________________________________ ? ?Should you have questions after your visit to [facility], please contact our office at [phone] and follow the prompts.  Our office hours are 8:00 a.m. and 4:30 p.m. Monday - Friday.  Please note that voicemails left after 4:00 p.m. may not be returned until the following business day.  We are closed weekends and major holidays.  You do have access to a nurse 24-7, just call the main number to the clinic [phone] and do not press any options, hold on the line and a nurse will answer the phone.   ? ?For prescription refill requests, have your pharmacy contact our office and  allow 72 hours.   ? ?Due to Covid, you will need to wear a mask upon entering the hospital. If you do not have a mask, a mask will be given to you at the Main Entrance upon arrival. For doctor visits, patients may have 1 support person age 31 or older with them. For treatment visits, patients can not have anyone with them due to social distancing guidelines and our immunocompromised population.  ? ?   ?

## 2021-05-09 NOTE — Patient Instructions (Signed)
[facility]  Discharge Instructions: ?Thank you for choosing [facility] to provide your oncology and hematology care.  ?If you have a lab appointment with the [facility], please come in thru the Main Entrance and check in at the main information desk. ? ?Wear comfortable clothing and clothing appropriate for easy access to any Portacath or PICC line.  ? ?We strive to give you quality time with your provider. You may need to reschedule your appointment if you arrive late (15 or more minutes).  Arriving late affects you and other patients whose appointments are after yours.  Also, if you miss three or more appointments without notifying the office, you may be dismissed from the clinic at the provider?s discretion.    ?  ?For prescription refill requests, have your pharmacy contact our office and allow 72 hours for refills to be completed.   ? ?Today you received the following chemotherapy and/or immunotherapy agents Taxol.     ?  ?To help prevent nausea and vomiting after your treatment, we encourage you to take your nausea medication as directed. ? ?BELOW ARE SYMPTOMS THAT SHOULD BE REPORTED IMMEDIATELY: ?*FEVER GREATER THAN 100.4 F (38 ?C) OR HIGHER ?*CHILLS OR SWEATING ?*NAUSEA AND VOMITING THAT IS NOT CONTROLLED WITH YOUR NAUSEA MEDICATION ?*UNUSUAL SHORTNESS OF BREATH ?*UNUSUAL BRUISING OR BLEEDING ?*URINARY PROBLEMS (pain or burning when urinating, or frequent urination) ?*BOWEL PROBLEMS (unusual diarrhea, constipation, pain near the anus) ?TENDERNESS IN MOUTH AND THROAT WITH OR WITHOUT PRESENCE OF ULCERS (sore throat, sores in mouth, or a toothache) ?UNUSUAL RASH, SWELLING OR PAIN  ?UNUSUAL VAGINAL DISCHARGE OR ITCHING  ? ?Items with * indicate a potential emergency and should be followed up as soon as possible or go to the Emergency Department if any problems should occur. ? ?Please show the CHEMOTHERAPY ALERT CARD or IMMUNOTHERAPY ALERT CARD at check-in to the Emergency  Department and triage nurse. ? ?Should you have questions after your visit or need to cancel or reschedule your appointment, please contact [facility] [phone]  and follow the prompts.  Office hours are 8:00 a.m. to 4:30 p.m. Monday - Friday. Please note that voicemails left after 4:00 p.m. may not be returned until the following business day.  We are closed weekends and major holidays. You have access to a nurse at all times for urgent questions. Please call the main number to the clinic [phone] and follow the prompts. ? ?For any non-urgent questions, you may also contact your provider using MyChart. We now offer e-Visits for anyone 102 and older to request care online for non-urgent symptoms. For details visit [email or web address]. ?  ?Also download the MyChart app! Go to the app store, search "MyChart", open the app, select [facility], and log in with your MyChart username and password. ? ?Due to Covid, a mask is required upon entering the hospital/clinic. If you do not have a mask, one will be given to you upon arrival. For doctor visits, patients may have 1 support person aged 87 or older with them. For treatment visits, patients cannot have anyone with them due to current Covid guidelines and our immunocompromised population.  ?

## 2021-05-09 NOTE — Progress Notes (Signed)
? ?[facility] ?[address] ?[place], [place] [address] ? ? ?CLINIC:  ?Medical Oncology/Hematology ? ?PCP:  ?Jessica Rival, FNP ?[address] / [place] [address] ?[phone] ? ? ?REASON FOR VISIT:  ?Follow-up for recurrent endometrial cancer ? ?PRIOR THERAPY: TAH & BSO, bilateral pelvic and para-aortic lymph nodes dissections on 04/13/2018 ? ?NGS Results: not done ? ?CURRENT THERAPY: Paclitaxel & Aloxi every 3 weeks ? ?BRIEF ONCOLOGIC HISTORY:  ?Oncology History  ?Endometrial cancer ([facility])  ?03/31/2018 Initial Diagnosis  ? Endometrial cancer ([facility]) ? ?  ?12/29/2018 -  Chemotherapy  ? Patient is on Treatment Plan : UTERINE Carboplatin AUC 6 / Paclitaxel q21d  ? ?  ?  ?06/06/2020 Miscellaneous  ? Summary of oncologic history from Dr. Delton Coombes  ? ?1.  Recurrent endometrial carcinosarcoma: ?-TAH, BSO, bilateral pelvic and para-aortic lymph node resections on 04/13/2018. ?-Pathology showed carcinosarcoma (malignant mixed mullerian tumor) is arising in an endometrial type polyp with no myometrial invasion identified.  High-grade.  0/39 lymph nodes positive.  PT1APN0, FIGO stage Ia.  MMR normal.  MSI-stable. ?-CTAP on 12/09/2018 for abdominal pain showed extensive peritoneal carcinomatosis and large soft tissue mass in the pelvis. ?-Biopsy of the omental mass on 12/28/2018 shows poorly differentiated carcinoma consistent with her prior malignancy. ?-Carboplatin and paclitaxel started on 12/29/2018. ?-CT scan on 05/02/2019 showed peritoneal implants in the low central small bowel mesentery and left pelvic sidewall have decreased in size measuring 1.5 x 1.9 cm and 1.7 x 2.2 cm.  Soft tissue nodule in the left lateral omentum measures 1 x 1.7 cm with no evidence of metastatic disease. ?-Continuation of chemotherapy until complete response was recommended. ?-CT AP on 07/04/2019 showed continued positive response to therapy with scattered peritoneal metastasis in the pelvis and left omentum  decreased in size.  No new metastatic disease. ?-CTAP on 10/24/2019 after 14 cycles showed substantial reduction in size of soft tissue nodules in the pelvis.  1.4 x 0.9 x 1 cm soft tissue density in the inferior serosal margin of the sigmoid colon, previously 1.7 x 1.1 x 1.7 cm.  2 small soft tissue nodules in the mesenteric adipose tissue above the urinary bladder measures 0.8 cm. ?-She has developed serious allergic reaction during cycle 14 with carboplatin. ?-I have discussed with Dr. Denman George.  Other options include adding ifosfamide to Taxol or Doxil and combination of lenvatinib with pembrolizumab.  In the lenvatinib pembrolizumab trial carcinosarcomas were not included. ?-Single agent paclitaxel started on 11/09/2019, dose reduced by 20% on 01/18/2020. ?-CTAP on 01/10/2020 with stable small peritoneal soft tissue nodules in the pelvis.  Resolution of soft tissue nodularity in the left abdominal omental fat with no new or progressive disease. ?  ?  ?06/20/2020 Imaging  ? 1. Stable examination status post hysterectomy without suspicious enhancing soft tissue nodularity at the vaginal cuff and no significant change in the multiple small soft tissue nodules in the pelvis likely representing treated tumor. No new or progressive findings. ?2. Cholelithiasis including a large gallstone in the neck of the gallbladder but without CT evidence of acute cholecystitis. ?3. Small hiatal hernia. ?4. Aortic atherosclerosis. ?  ?  ? ? ?CANCER STAGING: ? Cancer Staging  ?No matching staging information was found for the patient. ? ?INTERVAL HISTORY:  ?[name], a 61 y.o. female, returns for routine follow-up and consideration for next cycle of chemotherapy. [name] was last seen on 03/28/2021. ? ?Due for cycle #39 of Taxol today.  ? ?Overall,  she tells me she has been feeling pretty well. She reports 4-5 episodes of cramping in her right calves and drawing of the toes in her right foot since her last treatment; this  occurs during day and night, but occurs mostly at night. She reports Gabapentin helps with numbness in her legs but does not relieve the cramping. She continues to have right hip pain for which she is taking Oxycodone. She continues to take magnesium BID. She reports mild occasional abdominal pain. She denies numbness in her fingertips.  ? ?Overall, she feels ready for next cycle of chemo today.  ? ? ?REVIEW OF SYSTEMS:  ?Review of Systems  ?Constitutional:  Negative for appetite change and fatigue.  ?Respiratory:  Positive for shortness of breath.   ?Gastrointestinal:  Positive for abdominal pain, diarrhea and nausea.  ?Musculoskeletal:  Positive for arthralgias (6/10 legs and hip), back pain (6/10) and myalgias (R leg cramping).  ?Neurological:  Positive for numbness.  ?All other systems reviewed and are negative. ? ?PAST MEDICAL/SURGICAL HISTORY:  ?Past Medical History:  ?Diagnosis Date  ? Cancer [facility])   ? Phreesia 09/05/2019  ? Elevated blood pressure reading   ? Endometrial cancer ([facility])   ? Hyperlipidemia   ? Hypertension   ? PMB (postmenopausal bleeding)   ? Port-A-Cath in place 12/24/2018  ? ?Past Surgical History:  ?Procedure Laterality Date  ? ABDOMINAL HYSTERECTOMY N/A   ? [employer]reesia 09/05/2019  ? IR IMAGING GUIDED PORT INSERTION  12/28/2018  ? IR RADIOLOGIST EVAL & MGMT  06/02/2018  ? IR RADIOLOGIST EVAL & MGMT  06/16/2018  ? NECK SURGERY  2000  ? spinal surgery , titanium rod in place   ? ROBOTIC ASSISTED TOTAL HYSTERECTOMY WITH BILATERAL SALPINGO OOPHERECTOMY N/A 04/13/2018  ? Procedure: XI ROBOTIC ASSISTED TOTAL HYSTERECTOMY WITH BILATERAL SALPINGO OOPHORECTOMY;  Surgeon: Everitt Amber, MD;  Location: [facility];  Service: Gynecology;  Laterality: N/A;  ? ROBOTIC PELVIC AND PARA-AORTIC LYMPH NODE DISSECTION N/A 04/13/2018  ? Procedure: XI ROBOTIC PELVIC LYMPHADECTOMY AND PARA-AORTIC LYMPH NODE DISSECTION;  Surgeon: Everitt Amber, MD;  Location: [facility];  Service: Gynecology;  Laterality: N/A;  ? ? ?SOCIAL HISTORY:   ?Social History  ? ?Socioeconomic History  ? Marital status: Widowed  ?  Spouse name: Not on file  ? Number of children: 3  ? Years of education: Not on file  ? Highest education level: Not on file  ?Occupational History  ? Occupation: olive [employer]   ?  Comment: host  ?Tobacco Use  ? Smoking status: Never  ? Smokeless tobacco: Never  ?Vaping Use  ? Vaping Use: Never used  ?Substance and Sexual Activity  ? Alcohol use: Yes  ?  Alcohol/week: 4.0 standard drinks  ?  Types: 4 Glasses of wine per week  ?  Comment: weekends  ? Drug use: Never  ? Sexual activity: Not Currently  ?Other Topics Concern  ? Not on file  ?Social History Narrative  ? Lives with daughter and granddaughter here in [place], moved 3 years ago  ?   ? Cats: Zoe-75 years old, Eduard Clos, and Storm  ?   ? Enjoys: walking when she can tolerate- spending time with granddaughter (her life)  ?   ? Diet: eats all food groups  ? Caffeine: 1-2 cups at times, but does not tolerate it all the time  ? Water: 160 oz daily  ?   ? Wears seat belt   ? Does not use phone while driving   ? Smoke detectors  at home  ? Fire extinguisher-no  ? No weapons  ? ?Social Determinants of Health  ? ?Financial Resource Strain: Not on file  ?Food Insecurity: Not on file  ?Transportation Needs: Not on file  ?Physical Activity: Not on file  ?Stress: Not on file  ?Social Connections: Not on file  ?Intimate Partner Violence: Not on file  ? ? ?FAMILY HISTORY:  ?Family History  ?Problem Relation Age of Onset  ? Hypertension Father   ? Heart disease Father   ? Cancer Father   ?     lung  ? Hypertension Mother   ? Cancer Sister   ?     ovarian cancer  ? ? ?CURRENT MEDICATIONS:  ?Current Outpatient Medications  ?Medication Sig Dispense Refill  ? albuterol (VENTOLIN HFA) 108 (90 Base) MCG/ACT inhaler Inhale 2 puffs into the lungs every 6 (six) hours as needed for wheezing or shortness of breath. 8 g 2  ? amLODipine (NORVASC) 5 MG tablet Take 1 tablet (5 mg total) by mouth daily. 90 tablet 1   ? atorvastatin (LIPITOR) 20 MG tablet Take 1 tablet (20 mg total) by mouth daily. 90 tablet 1  ? benzonatate (TESSALON) 100 MG capsule Take 1 capsule (100 mg total) by mouth 2 (two) times daily as needed for

## 2021-05-09 NOTE — Progress Notes (Signed)
Patient has been examined by Dr. Katragadda, and vital signs and labs have been reviewed. ANC, Creatinine, LFTs, hemoglobin, and platelets are within treatment parameters per M.D. - pt may proceed with treatment.    °

## 2021-05-10 LAB — CA 125: Cancer Antigen (CA) 125: 6.9 U/mL (ref 0.0–38.1)

## 2021-05-22 ENCOUNTER — Encounter (HOSPITAL_COMMUNITY): Payer: Self-pay | Admitting: Hematology

## 2021-05-30 ENCOUNTER — Encounter: Payer: Medicaid Other | Admitting: Nurse Practitioner

## 2021-05-30 ENCOUNTER — Inpatient Hospital Stay (HOSPITAL_COMMUNITY): Payer: Medicare Other

## 2021-05-30 ENCOUNTER — Inpatient Hospital Stay (HOSPITAL_COMMUNITY): Payer: Medicare Other | Attending: Hematology

## 2021-05-30 ENCOUNTER — Encounter (HOSPITAL_COMMUNITY): Payer: Self-pay

## 2021-05-30 VITALS — BP 138/85 | HR 70 | Temp 97.5°F | Resp 18

## 2021-05-30 DIAGNOSIS — C541 Malignant neoplasm of endometrium: Secondary | ICD-10-CM | POA: Diagnosis present

## 2021-05-30 DIAGNOSIS — C786 Secondary malignant neoplasm of retroperitoneum and peritoneum: Secondary | ICD-10-CM | POA: Insufficient documentation

## 2021-05-30 DIAGNOSIS — Z90710 Acquired absence of both cervix and uterus: Secondary | ICD-10-CM | POA: Insufficient documentation

## 2021-05-30 DIAGNOSIS — Z9079 Acquired absence of other genital organ(s): Secondary | ICD-10-CM | POA: Insufficient documentation

## 2021-05-30 DIAGNOSIS — Z95828 Presence of other vascular implants and grafts: Secondary | ICD-10-CM

## 2021-05-30 DIAGNOSIS — Z5111 Encounter for antineoplastic chemotherapy: Secondary | ICD-10-CM | POA: Diagnosis present

## 2021-05-30 DIAGNOSIS — Z90722 Acquired absence of ovaries, bilateral: Secondary | ICD-10-CM | POA: Diagnosis not present

## 2021-05-30 LAB — CBC WITH DIFFERENTIAL/PLATELET
Abs Immature Granulocytes: 0.04 10*3/uL (ref 0.00–0.07)
Basophils Absolute: 0.1 10*3/uL (ref 0.0–0.1)
Basophils Relative: 1 %
Eosinophils Absolute: 0.1 10*3/uL (ref 0.0–0.5)
Eosinophils Relative: 1 %
HCT: 37.3 % (ref 36.0–46.0)
Hemoglobin: 11.8 g/dL — ABNORMAL LOW (ref 12.0–15.0)
Immature Granulocytes: 0 %
Lymphocytes Relative: 23 %
Lymphs Abs: 2.4 10*3/uL (ref 0.7–4.0)
MCH: 26.5 pg (ref 26.0–34.0)
MCHC: 31.6 g/dL (ref 30.0–36.0)
MCV: 83.6 fL (ref 80.0–100.0)
Monocytes Absolute: 0.7 10*3/uL (ref 0.1–1.0)
Monocytes Relative: 7 %
Neutro Abs: 7.2 10*3/uL (ref 1.7–7.7)
Neutrophils Relative %: 68 %
Platelets: 346 10*3/uL (ref 150–400)
RBC: 4.46 MIL/uL (ref 3.87–5.11)
RDW: 16.9 % — ABNORMAL HIGH (ref 11.5–15.5)
WBC: 10.5 10*3/uL (ref 4.0–10.5)
nRBC: 0 % (ref 0.0–0.2)

## 2021-05-30 LAB — COMPREHENSIVE METABOLIC PANEL
ALT: 25 U/L (ref 0–44)
AST: 22 U/L (ref 15–41)
Albumin: 3.9 g/dL (ref 3.5–5.0)
Alkaline Phosphatase: 120 U/L (ref 38–126)
Anion gap: 6 (ref 5–15)
BUN: 15 mg/dL (ref 6–20)
CO2: 25 mmol/L (ref 22–32)
Calcium: 9 mg/dL (ref 8.9–10.3)
Chloride: 109 mmol/L (ref 98–111)
Creatinine, Ser: 0.7 mg/dL (ref 0.44–1.00)
GFR, Estimated: >60 mL/min (ref >60)
Glucose, Bld: 114 mg/dL — ABNORMAL HIGH (ref 70–99)
Potassium: 3.6 mmol/L (ref 3.5–5.1)
Sodium: 140 mmol/L (ref 135–145)
Total Bilirubin: 0.6 mg/dL (ref 0.3–1.2)
Total Protein: 7.2 g/dL (ref 6.5–8.1)

## 2021-05-30 LAB — MAGNESIUM: Magnesium: 1.9 mg/dL (ref 1.7–2.4)

## 2021-05-30 MED ORDER — SODIUM CHLORIDE 0.9 % IV SOLN
10.0000 mg | Freq: Once | INTRAVENOUS | Status: AC
  Administered 2021-05-30: 10 mg via INTRAVENOUS
  Filled 2021-05-30: qty 10

## 2021-05-30 MED ORDER — SODIUM CHLORIDE 0.9 % IV SOLN: Freq: Once | INTRAVENOUS | Status: AC

## 2021-05-30 MED ORDER — HEPARIN SOD (PORK) LOCK FLUSH 100 UNIT/ML IV SOLN
500.0000 [IU] | Freq: Once | INTRAVENOUS | Status: AC | PRN
  Administered 2021-05-30: 500 [IU]

## 2021-05-30 MED ORDER — PALONOSETRON HCL INJECTION 0.25 MG/5ML
0.2500 mg | Freq: Once | INTRAVENOUS | Status: AC
  Administered 2021-05-30: 0.25 mg via INTRAVENOUS
  Filled 2021-05-30: qty 5

## 2021-05-30 MED ORDER — DIPHENHYDRAMINE HCL 50 MG/ML IJ SOLN
25.0000 mg | Freq: Once | INTRAMUSCULAR | Status: AC
  Administered 2021-05-30: 25 mg via INTRAVENOUS
  Filled 2021-05-30: qty 1

## 2021-05-30 MED ORDER — FAMOTIDINE IN NACL 20-0.9 MG/50ML-% IV SOLN
20.0000 mg | Freq: Once | INTRAVENOUS | Status: AC
  Administered 2021-05-30: 20 mg via INTRAVENOUS
  Filled 2021-05-30: qty 50

## 2021-05-30 MED ORDER — SODIUM CHLORIDE 0.9 % IV SOLN
140.0000 mg/m2 | Freq: Once | INTRAVENOUS | Status: AC
  Administered 2021-05-30: 306 mg via INTRAVENOUS
  Filled 2021-05-30: qty 50

## 2021-05-30 MED ORDER — SODIUM CHLORIDE 0.9% FLUSH
10.0000 mL | INTRAVENOUS | Status: DC | PRN
  Administered 2021-05-30 (×2): 10 mL

## 2021-05-30 NOTE — Progress Notes (Signed)
Patient's next infusion is 07/04/2021 d/t patient's request to have infusion pushed back to take a break. ?Patient tolerated chemotherapy with no complaints voiced. Side effects with management reviewed understanding verbalized. Port site clean and dry with no bruising or swelling noted at site. Good blood return noted before and after administration of chemotherapy. Band aid applied. Patient left in satisfactory condition with VSS and no s/s of distress noted.  ?

## 2021-05-30 NOTE — Patient Instructions (Signed)
[facility]  Discharge Instructions: ?Thank you for choosing [facility] to provide your oncology and hematology care.  ?If you have a lab appointment with the [facility], please come in thru the Main Entrance and check in at the main information desk. ? ?Wear comfortable clothing and clothing appropriate for easy access to any Portacath or PICC line.  ? ?We strive to give you quality time with your provider. You may need to reschedule your appointment if you arrive late (15 or more minutes).  Arriving late affects you and other patients whose appointments are after yours.  Also, if you miss three or more appointments without notifying the office, you may be dismissed from the clinic at the provider?s discretion.    ?  ?For prescription refill requests, have your pharmacy contact our office and allow 72 hours for refills to be completed.   ? ?Today you received the following chemotherapy and/or immunotherapy agents Taxol, return as scheduled. ?  ?To help prevent nausea and vomiting after your treatment, we encourage you to take your nausea medication as directed. ? ?BELOW ARE SYMPTOMS THAT SHOULD BE REPORTED IMMEDIATELY: ?*FEVER GREATER THAN 100.4 F (38 ?C) OR HIGHER ?*CHILLS OR SWEATING ?*NAUSEA AND VOMITING THAT IS NOT CONTROLLED WITH YOUR NAUSEA MEDICATION ?*UNUSUAL SHORTNESS OF BREATH ?*UNUSUAL BRUISING OR BLEEDING ?*URINARY PROBLEMS (pain or burning when urinating, or frequent urination) ?*BOWEL PROBLEMS (unusual diarrhea, constipation, pain near the anus) ?TENDERNESS IN MOUTH AND THROAT WITH OR WITHOUT PRESENCE OF ULCERS (sore throat, sores in mouth, or a toothache) ?UNUSUAL RASH, SWELLING OR PAIN  ?UNUSUAL VAGINAL DISCHARGE OR ITCHING  ? ?Items with * indicate a potential emergency and should be followed up as soon as possible or go to the Emergency Department if any problems should occur. ? ?Please show the CHEMOTHERAPY ALERT CARD or IMMUNOTHERAPY ALERT CARD at check-in to the  Emergency Department and triage nurse. ? ?Should you have questions after your visit or need to cancel or reschedule your appointment, please contact [facility] [phone]  and follow the prompts.  Office hours are 8:00 a.m. to 4:30 p.m. Monday - Friday. Please note that voicemails left after 4:00 p.m. may not be returned until the following business day.  We are closed weekends and major holidays. You have access to a nurse at all times for urgent questions. Please call the main number to the clinic [phone] and follow the prompts. ? ?For any non-urgent questions, you may also contact your provider using MyChart. We now offer e-Visits for anyone 62 and older to request care online for non-urgent symptoms. For details visit [email or web address]. ?  ?Also download the MyChart app! Go to the app store, search "MyChart", open the app, select [facility], and log in with your MyChart username and password. ? ?Due to Covid, a mask is required upon entering the hospital/clinic. If you do not have a mask, one will be given to you upon arrival. For doctor visits, patients may have 1 support person aged 66 or older with them. For treatment visits, patients cannot have anyone with them due to current Covid guidelines and our immunocompromised population.  ?

## 2021-06-13 LAB — TSH: TSH: 3.14 u[IU]/mL (ref 0.450–4.500)

## 2021-06-13 LAB — VITAMIN D 25 HYDROXY (VIT D DEFICIENCY, FRACTURES): Vit D, 25-Hydroxy: 20.7 ng/mL — ABNORMAL LOW (ref 30.0–100.0)

## 2021-06-13 LAB — HEMOGLOBIN A1C
Est. average glucose Bld gHb Est-mCnc: 134 mg/dL
Hgb A1c MFr Bld: 6.3 % — ABNORMAL HIGH (ref 4.8–5.6)

## 2021-06-13 NOTE — Progress Notes (Signed)
Will discuss results at her upcoming appointment ,

## 2021-06-18 ENCOUNTER — Ambulatory Visit (INDEPENDENT_AMBULATORY_CARE_PROVIDER_SITE_OTHER): Payer: Medicaid Other | Admitting: Nurse Practitioner

## 2021-06-18 ENCOUNTER — Encounter: Payer: Self-pay | Admitting: Nurse Practitioner

## 2021-06-18 VITALS — BP 128/84 | HR 63 | Ht 64.0 in | Wt 235.0 lb

## 2021-06-18 DIAGNOSIS — E559 Vitamin D deficiency, unspecified: Secondary | ICD-10-CM

## 2021-06-18 DIAGNOSIS — Z0001 Encounter for general adult medical examination with abnormal findings: Secondary | ICD-10-CM

## 2021-06-18 DIAGNOSIS — Z1211 Encounter for screening for malignant neoplasm of colon: Secondary | ICD-10-CM | POA: Diagnosis not present

## 2021-06-18 DIAGNOSIS — Z0000 Encounter for general adult medical examination without abnormal findings: Secondary | ICD-10-CM | POA: Insufficient documentation

## 2021-06-18 DIAGNOSIS — Z1231 Encounter for screening mammogram for malignant neoplasm of breast: Secondary | ICD-10-CM | POA: Diagnosis not present

## 2021-06-18 DIAGNOSIS — H543 Unqualified visual loss, both eyes: Secondary | ICD-10-CM

## 2021-06-18 DIAGNOSIS — N644 Mastodynia: Secondary | ICD-10-CM

## 2021-06-18 DIAGNOSIS — R7303 Prediabetes: Secondary | ICD-10-CM | POA: Insufficient documentation

## 2021-06-18 DIAGNOSIS — E785 Hyperlipidemia, unspecified: Secondary | ICD-10-CM

## 2021-06-18 DIAGNOSIS — I10 Essential (primary) hypertension: Secondary | ICD-10-CM

## 2021-06-18 MED ORDER — VITAMIN D3 25 MCG (1000 UT) PO CAPS: 1000.0000 [IU] | ORAL_CAPSULE | Freq: Every day | ORAL | 2 refills | Status: AC

## 2021-06-18 NOTE — Patient Instructions (Signed)

## 2021-06-18 NOTE — Assessment & Plan Note (Signed)
Last vitamin D Lab Results  Component Value Date   VD25OH 20.7 (L) 06/12/2021  Start taking vitamin D 1000 units daily

## 2021-06-18 NOTE — Assessment & Plan Note (Signed)
BP Readings from Last 3 Encounters:  06/18/21 128/84  05/30/21 138/85  05/30/21 (!) 145/82  Chronic condition well-controlled on amlodipine 5 mg daily continue current medication DASH diet advised, BG in regular exercises at least 150 minutes weekly

## 2021-06-18 NOTE — Assessment & Plan Note (Signed)
mammogram ordered No masses palpated, no skin discoloration noted, no nipple discharge noted.

## 2021-06-18 NOTE — Assessment & Plan Note (Signed)
On lipitor '20mg'$  daily  Check lipid panel Avoid fatty fried foods

## 2021-06-18 NOTE — Assessment & Plan Note (Signed)
Annual exam as documented.  ?Counseling done include healthy lifestyle involving committing to 150 minutes of exercise per week, heart healthy diet, and attaining healthy weight. The importance of adequate sleep also discussed.  ?Regular use of seat belt and home safety were also discussed . ?Changes in health habits are decided on by patient with goals and time frames set for achieving them. ?Immunization and cancer screening  needs are specifically addressed at this visit.   ?

## 2021-06-18 NOTE — Progress Notes (Signed)
Complete physical exam  Patient: Jessica [name]   DOB: [birth year]   61 y.o. Female  MRN: [id number]  Subjective:    Chief Complaint  Patient presents with   Annual Exam    CPE    Jessica [name] is a 61 y.o. female with past medical history of essential hypertension, endometrial cancer, hyperlipidemia, depression and anxiety who presents today for a complete physical exam. She reports consuming a general diet. The patient does not participate in regular exercise at present. She generally feels poorly. She reports sleeping fairly well. She does not have additional problems to discuss today.    Gest checomtherapy every 3 weeks for endometrial cancer, denies fever, chills,  Has had 2 doses of shingles vaccines at the pharamcy , will obtain records   Due for colon cancer screening and breast cancer screening, Cologuard and mammogram ordered.  Most recent fall risk assessment:    06/18/2021    1:28 PM  [place] in the past year? 0  Number falls in past yr: 0  Injury with Fall? 0  Risk for fall due to : No Fall Risks  Follow up Falls evaluation completed     Most recent depression screenings:    06/18/2021    1:28 PM 02/11/2021    2:49 PM  PHQ 2/9 Scores  PHQ - 2 Score 0 0  PHQ- 9 Score  8        Patient Care Team: Renee Rival, FNP as PCP - General (Nurse Practitioner) Donetta Potts, RN as Oncology Nurse Navigator [name], MD as Medical Oncologist (Oncology)   Outpatient Medications Prior to Visit  Medication Sig   amLODipine (NORVASC) 5 MG tablet Take 1 tablet (5 mg total) by mouth daily.   atorvastatin (LIPITOR) 20 MG tablet Take 1 tablet (20 mg total) by mouth daily.   cyclobenzaprine (FLEXERIL) 5 MG tablet Take 1 tablet (5 mg total) by mouth 3 (three) times daily as needed for muscle spasms.   EPINEPHrine 0.3 mg/0.3 mL IJ SOAJ injection Inject 0.3 mg into the muscle as needed for anaphylaxis.   gabapentin (NEURONTIN) 300 MG  capsule Take 1 capsule (300 mg total) by mouth 3 (three) times daily.   lidocaine-prilocaine (EMLA) cream Apply to port site prior to use (Patient taking differently: 1 application. daily as needed (apply to port site prior to chemo).)   magnesium oxide (MAG-OX) 400 (240 Mg) MG tablet Take 1 tablet (400 mg total) by mouth 2 (two) times daily.   oxyCODONE-acetaminophen (PERCOCET) 5-325 MG tablet Take 1 tablet by mouth every 12 (twelve) hours as needed (pain).   PACLITAXEL IV Inject into the vein every 21 ( twenty-one) days.   prochlorperazine (COMPAZINE) 10 MG tablet Take 1 tablet (10 mg total) by mouth every 6 (six) hours as needed (Nausea or vomiting).   sertraline (ZOLOFT) 100 MG tablet Take 1 tablet (100 mg total) by mouth daily.   Facility-Administered Medications Prior to Visit  Medication Dose Route Frequency Provider   diphenhydrAMINE (BENADRYL) 50 MG/ML injection        palonosetron (ALOXI) 0.25 MG/5ML injection         Review of Systems  Constitutional: Negative.  Negative for chills, fever and weight loss.  HENT: Negative.    Eyes:  Positive for blurred vision. Negative for double vision, photophobia, pain, discharge and redness.  Respiratory:  Negative for cough, hemoptysis, sputum production, shortness of breath and wheezing.   Cardiovascular:  Negative for  chest pain, palpitations, orthopnea and claudication.  Gastrointestinal: Negative.  Negative for abdominal pain, diarrhea, heartburn, nausea and vomiting.  Musculoskeletal:  Positive for joint pain. Negative for myalgias.  Skin: Negative.  Negative for itching and rash.  Neurological:  Negative for dizziness, tingling, tremors, sensory change and speech change.  Psychiatric/Behavioral:  Negative for depression, hallucinations, memory loss, substance abuse and suicidal ideas. The patient is not nervous/anxious and does not have insomnia.          Objective:     BP 128/84 (BP Location: Right Arm, Patient Position:  Sitting, Cuff Size: Large)   Pulse 63   Ht '5\' 4"'$  (1.626 m)   Wt 235 lb (106.6 kg)   SpO2 93%   BMI 40.34 kg/m    Physical Exam Exam conducted with a chaperone present.  Constitutional:      General: She is not in acute distress.    Appearance: She is obese. She is not ill-appearing, toxic-appearing or diaphoretic.  HENT:     Head: Normocephalic and atraumatic.     Right Ear: Tympanic membrane, ear canal and external ear normal. There is no impacted cerumen.     Left Ear: Tympanic membrane, ear canal and external ear normal. There is no impacted cerumen.     Nose: Nose normal. No congestion or rhinorrhea.     Mouth/Throat:     Mouth: Mucous membranes are moist.     Pharynx: Oropharynx is clear. No oropharyngeal exudate or posterior oropharyngeal erythema.  Eyes:     General: No scleral icterus.       Right eye: No discharge.        Left eye: No discharge.     Extraocular Movements: Extraocular movements intact.     Conjunctiva/sclera: Conjunctivae normal.     Pupils: Pupils are equal, round, and reactive to light.  Neck:     Vascular: No carotid bruit.  Cardiovascular:     Rate and Rhythm: Normal rate and regular rhythm.     Pulses: Normal pulses.     Heart sounds: Normal heart sounds. No murmur heard.   No friction rub. No gallop.  Pulmonary:     Effort: Pulmonary effort is normal. No respiratory distress.     Breath sounds: Normal breath sounds. No stridor. No wheezing, rhonchi or rales.  Chest:     Chest wall: No mass, lacerations, deformity, swelling or tenderness.  Breasts:    Right: Tenderness present. No swelling, bleeding, inverted nipple, mass, nipple discharge or skin change.     Left: Normal. No swelling, bleeding, inverted nipple, mass, nipple discharge, skin change or tenderness.     Comments: Tenderness on palpation of right lower outer  quadrant Abdominal:     General: There is no distension.     Palpations: Abdomen is soft. There is no mass.      Tenderness: There is no abdominal tenderness. There is no right CVA tenderness, left CVA tenderness, guarding or rebound.     Hernia: No hernia is present.  Musculoskeletal:        General: No swelling, tenderness, deformity or signs of injury.     Cervical back: Normal range of motion and neck supple. No rigidity or tenderness.     Right lower leg: No edema.     Left lower leg: No edema.  Lymphadenopathy:     Cervical: No cervical adenopathy.     Upper Body:     Right upper body: No supraclavicular, axillary or pectoral adenopathy.  Left upper body: No supraclavicular, axillary or pectoral adenopathy.  Skin:    General: Skin is warm and dry.     Capillary Refill: Capillary refill takes less than 2 seconds.     Coloration: Skin is not jaundiced or pale.     Findings: No bruising, erythema, lesion or rash.  Neurological:     Mental Status: She is alert and oriented to person, place, and time.     Cranial Nerves: No cranial nerve deficit.     Sensory: No sensory deficit.     Motor: No weakness.     Coordination: Coordination normal.     Gait: Gait normal.     Deep Tendon Reflexes: Reflexes normal.  Psychiatric:        Mood and Affect: Mood normal.        Behavior: Behavior normal.        Thought Content: Thought content normal.        Judgment: Judgment normal.     No results found for any visits on 06/18/21.     Assessment & Plan:    Routine Health Maintenance and Physical Exam  Immunization History  Administered Date(s) Administered   Influenza,inj,Quad PF,6+ Mos 10/14/2019, 10/25/2020   Moderna Covid-19 Vaccine Bivalent Booster 35yr & up 12/21/2019   Moderna Sars-Covid-2 Vaccination 04/12/2019, 05/10/2019, 11/27/2020    Health Maintenance  Topic Date Due   Zoster Vaccines- Shingrix (1 of 2) Never done   COLONOSCOPY (Pts 45-432yrInsurance coverage will need to be confirmed)  Never done   COVID-19 Vaccine (4 - Booster for Moderna series) 01/22/2021   PAP  SMEAR-Modifier  03/28/2021   INFLUENZA VACCINE  08/20/2021   MAMMOGRAM  05/16/2022   TETANUS/TDAP  04/25/2028   Hepatitis C Screening  Completed   HIV Screening  Completed   HPV VACCINES  Aged Out    Discussed health benefits of physical activity, and encouraged her to engage in regular exercise appropriate for her age and condition.  Problem List Items Addressed This Visit       Cardiovascular and Mediastinum   Essential hypertension    BP Readings from Last 3 Encounters:  06/18/21 128/84  05/30/21 138/85  05/30/21 (!) 145/82  Chronic condition well-controlled on amlodipine 5 mg daily continue current medication DASH diet advised, BG in regular exercises at least 150 minutes weekly        Other   Hyperlipidemia    On lipitor '20mg'$  daily  Check lipid panel Avoid fatty fried foods       Relevant Orders   Lipid Profile   Vitamin D deficiency    Last vitamin D Lab Results  Component Value Date   VD25OH 20.7 (L) 06/12/2021  Start taking vitamin D 1000 units daily      Relevant Medications   Cholecalciferol (VITAMIN D3) 25 MCG (1000 UT) CAPS   Prediabetes    Lab Results  Component Value Date   HGBA1C 6.3 (H) 06/12/2021  Avoid sugar sweets soda Engage in regular daily exercises as tolerated at least 150 minutes weekly      Annual physical exam - Primary    Annual exam as documented.  Counseling done include healthy lifestyle involving committing to 150 minutes of exercise per week, heart healthy diet, and attaining healthy weight. The importance of adequate sleep also discussed.  Regular use of seat belt and home safety were also discussed . Changes in health habits are decided on by patient with goals and time frames set for achieving them.  Immunization and cancer screening  needs are specifically addressed at this visit.         Impaired vision in both eyes    States that she will find an opthamologist by herself Currently wears reading glasses.         Breast tenderness in female    mammogram ordered No masses palpated, no skin discoloration noted, no nipple discharge noted.        Other Visit Diagnoses     Screening for colon cancer       Relevant Orders   Cologuard   Encounter for screening mammogram for malignant neoplasm of breast       Relevant Orders   MM 3D SCREEN BREAST BILATERAL      Return in about 6 months (around 12/19/2021).     Renee Rival, FNP

## 2021-06-18 NOTE — Assessment & Plan Note (Signed)
Lab Results  Component Value Date   HGBA1C 6.3 (H) 06/12/2021  Avoid sugar sweets soda Engage in regular daily exercises as tolerated at least 150 minutes weekly

## 2021-06-18 NOTE — Assessment & Plan Note (Signed)
States that she will find an opthamologist by herself Currently wears reading glasses.

## 2021-06-19 ENCOUNTER — Telehealth: Payer: Self-pay

## 2021-06-19 ENCOUNTER — Ambulatory Visit
Admission: RE | Admit: 2021-06-19 | Discharge: 2021-06-19 | Disposition: A | Discharge status: Home / Self Care | Payer: Medicare Other | Source: Ambulatory Visit | Attending: Nurse Practitioner | Admitting: Nurse Practitioner

## 2021-06-19 NOTE — Telephone Encounter (Signed)
Pt is going to come to the truck today for mammogram

## 2021-06-19 NOTE — Telephone Encounter (Signed)
Called pt to see if pt wants to schedule mammo or come in today for walk in with truck no answer left vm

## 2021-06-20 ENCOUNTER — Inpatient Hospital Stay (HOSPITAL_COMMUNITY): Payer: Medicare Other

## 2021-06-20 ENCOUNTER — Inpatient Hospital Stay (HOSPITAL_COMMUNITY): Payer: Medicare Other | Admitting: Hematology

## 2021-06-20 ENCOUNTER — Other Ambulatory Visit: Payer: Self-pay | Admitting: Nurse Practitioner

## 2021-06-21 ENCOUNTER — Other Ambulatory Visit: Payer: Self-pay | Admitting: Nurse Practitioner

## 2021-06-21 ENCOUNTER — Telehealth: Payer: Self-pay | Admitting: Nurse Practitioner

## 2021-06-21 LAB — LIPID PANEL
Chol/HDL Ratio: 2.4 ratio (ref 0.0–4.4)
Cholesterol, Total: 197 mg/dL (ref 100–199)
HDL: 81 mg/dL (ref >39)
LDL Chol Calc (NIH): 104 mg/dL — ABNORMAL HIGH (ref 0–99)
Triglycerides: 63 mg/dL (ref 0–149)
VLDL Cholesterol Cal: 12 mg/dL (ref 5–40)

## 2021-06-21 MED ORDER — SERTRALINE HCL 100 MG PO TABS: 100.0000 mg | ORAL_TABLET | Freq: Every day | ORAL | 1 refills | Status: DC

## 2021-06-21 NOTE — Telephone Encounter (Signed)
Spoke with pt

## 2021-06-21 NOTE — Telephone Encounter (Signed)
Please advise 

## 2021-06-21 NOTE — Telephone Encounter (Signed)
Pt returning phone call ° °

## 2021-06-21 NOTE — Progress Notes (Signed)
LDL slightly elevated patient should continue atorvastatin 20 mg daily  Eat a healthy diet, including lots of fruits and vegetables. Avoid foods with a lot of saturated and trans fats, such as red meat, butter, fried foods and cheese . Maintain a healthy weight.    The 10-year ASCVD risk score (Arnett DK, et al., 2019) is: 2.6%   Values used to calculate the score:     Age: 61 years     Sex: Female     Is Non-Hispanic African American: No     Diabetic: No     Tobacco smoker: No     Systolic Blood Pressure: 474 mmHg     Is BP treated: Yes     HDL Cholesterol: 81 mg/dL     Total Cholesterol: 197 mg/dL

## 2021-07-03 LAB — COLOGUARD: COLOGUARD: NEGATIVE

## 2021-07-03 NOTE — Progress Notes (Signed)
Normal Cologuard test.

## 2021-07-04 ENCOUNTER — Inpatient Hospital Stay (HOSPITAL_COMMUNITY): Payer: Medicare Other | Attending: Hematology

## 2021-07-04 ENCOUNTER — Inpatient Hospital Stay (HOSPITAL_BASED_OUTPATIENT_CLINIC_OR_DEPARTMENT_OTHER): Payer: Medicare Other | Admitting: Hematology

## 2021-07-04 ENCOUNTER — Inpatient Hospital Stay (HOSPITAL_COMMUNITY): Payer: Medicare Other

## 2021-07-04 VITALS — BP 128/79 | HR 77 | Resp 18

## 2021-07-04 VITALS — BP 136/83 | HR 69 | Temp 97.9°F | Resp 18 | Ht 64.0 in | Wt 229.1 lb

## 2021-07-04 DIAGNOSIS — C786 Secondary malignant neoplasm of retroperitoneum and peritoneum: Secondary | ICD-10-CM | POA: Diagnosis present

## 2021-07-04 DIAGNOSIS — I10 Essential (primary) hypertension: Secondary | ICD-10-CM | POA: Insufficient documentation

## 2021-07-04 DIAGNOSIS — R928 Other abnormal and inconclusive findings on diagnostic imaging of breast: Secondary | ICD-10-CM | POA: Insufficient documentation

## 2021-07-04 DIAGNOSIS — Z79899 Other long term (current) drug therapy: Secondary | ICD-10-CM | POA: Insufficient documentation

## 2021-07-04 DIAGNOSIS — M25551 Pain in right hip: Secondary | ICD-10-CM | POA: Insufficient documentation

## 2021-07-04 DIAGNOSIS — Z90710 Acquired absence of both cervix and uterus: Secondary | ICD-10-CM | POA: Diagnosis not present

## 2021-07-04 DIAGNOSIS — Z5111 Encounter for antineoplastic chemotherapy: Secondary | ICD-10-CM | POA: Diagnosis present

## 2021-07-04 DIAGNOSIS — G629 Polyneuropathy, unspecified: Secondary | ICD-10-CM | POA: Diagnosis not present

## 2021-07-04 DIAGNOSIS — C541 Malignant neoplasm of endometrium: Secondary | ICD-10-CM

## 2021-07-04 DIAGNOSIS — M25552 Pain in left hip: Secondary | ICD-10-CM | POA: Diagnosis not present

## 2021-07-04 DIAGNOSIS — M545 Low back pain, unspecified: Secondary | ICD-10-CM | POA: Diagnosis not present

## 2021-07-04 DIAGNOSIS — R109 Unspecified abdominal pain: Secondary | ICD-10-CM | POA: Insufficient documentation

## 2021-07-04 DIAGNOSIS — Z95828 Presence of other vascular implants and grafts: Secondary | ICD-10-CM

## 2021-07-04 LAB — CBC WITH DIFFERENTIAL/PLATELET
Abs Immature Granulocytes: 0.02 10*3/uL (ref 0.00–0.07)
Basophils Absolute: 0.1 10*3/uL (ref 0.0–0.1)
Basophils Relative: 1 %
Eosinophils Absolute: 0.3 10*3/uL (ref 0.0–0.5)
Eosinophils Relative: 4 %
HCT: 39.1 % (ref 36.0–46.0)
Hemoglobin: 12.5 g/dL (ref 12.0–15.0)
Immature Granulocytes: 0 %
Lymphocytes Relative: 29 %
Lymphs Abs: 2.4 10*3/uL (ref 0.7–4.0)
MCH: 26.5 pg (ref 26.0–34.0)
MCHC: 32 g/dL (ref 30.0–36.0)
MCV: 83 fL (ref 80.0–100.0)
Monocytes Absolute: 0.6 10*3/uL (ref 0.1–1.0)
Monocytes Relative: 7 %
Neutro Abs: 4.8 10*3/uL (ref 1.7–7.7)
Neutrophils Relative %: 59 %
Platelets: 321 10*3/uL (ref 150–400)
RBC: 4.71 MIL/uL (ref 3.87–5.11)
RDW: 17.3 % — ABNORMAL HIGH (ref 11.5–15.5)
WBC: 8.2 10*3/uL (ref 4.0–10.5)
nRBC: 0 % (ref 0.0–0.2)

## 2021-07-04 LAB — COMPREHENSIVE METABOLIC PANEL
ALT: 20 U/L (ref 0–44)
AST: 20 U/L (ref 15–41)
Albumin: 3.9 g/dL (ref 3.5–5.0)
Alkaline Phosphatase: 114 U/L (ref 38–126)
Anion gap: 7 (ref 5–15)
BUN: 13 mg/dL (ref 6–20)
CO2: 25 mmol/L (ref 22–32)
Calcium: 9.3 mg/dL (ref 8.9–10.3)
Chloride: 109 mmol/L (ref 98–111)
Creatinine, Ser: 0.67 mg/dL (ref 0.44–1.00)
GFR, Estimated: >60 mL/min (ref >60)
Glucose, Bld: 112 mg/dL — ABNORMAL HIGH (ref 70–99)
Potassium: 3.8 mmol/L (ref 3.5–5.1)
Sodium: 141 mmol/L (ref 135–145)
Total Bilirubin: 0.5 mg/dL (ref 0.3–1.2)
Total Protein: 7.4 g/dL (ref 6.5–8.1)

## 2021-07-04 LAB — MAGNESIUM: Magnesium: 1.9 mg/dL (ref 1.7–2.4)

## 2021-07-04 MED ORDER — SODIUM CHLORIDE 0.9 % IV SOLN
140.0000 mg/m2 | Freq: Once | INTRAVENOUS | Status: AC
  Administered 2021-07-04: 306 mg via INTRAVENOUS
  Filled 2021-07-04: qty 51

## 2021-07-04 MED ORDER — SODIUM CHLORIDE 0.9% FLUSH
10.0000 mL | INTRAVENOUS | Status: DC | PRN
  Administered 2021-07-04: 10 mL

## 2021-07-04 MED ORDER — DIPHENHYDRAMINE HCL 50 MG/ML IJ SOLN
25.0000 mg | Freq: Once | INTRAMUSCULAR | Status: AC
  Administered 2021-07-04: 25 mg via INTRAVENOUS
  Filled 2021-07-04: qty 1

## 2021-07-04 MED ORDER — FAMOTIDINE IN NACL 20-0.9 MG/50ML-% IV SOLN
20.0000 mg | Freq: Once | INTRAVENOUS | Status: AC
  Administered 2021-07-04: 20 mg via INTRAVENOUS
  Filled 2021-07-04: qty 50

## 2021-07-04 MED ORDER — PALONOSETRON HCL INJECTION 0.25 MG/5ML
0.2500 mg | Freq: Once | INTRAVENOUS | Status: AC
  Administered 2021-07-04: 0.25 mg via INTRAVENOUS
  Filled 2021-07-04: qty 5

## 2021-07-04 MED ORDER — SODIUM CHLORIDE 0.9 % IV SOLN: Freq: Once | INTRAVENOUS | Status: AC

## 2021-07-04 MED ORDER — SODIUM CHLORIDE 0.9 % IV SOLN
10.0000 mg | Freq: Once | INTRAVENOUS | Status: AC
  Administered 2021-07-04: 10 mg via INTRAVENOUS
  Filled 2021-07-04: qty 10

## 2021-07-04 MED ORDER — HEPARIN SOD (PORK) LOCK FLUSH 100 UNIT/ML IV SOLN
500.0000 [IU] | Freq: Once | INTRAVENOUS | Status: AC | PRN
  Administered 2021-07-04: 500 [IU]

## 2021-07-04 NOTE — Patient Instructions (Signed)
[facility]  Discharge Instructions: Thank you for choosing [facility] to provide your oncology and hematology care.  If you have a lab appointment with the Cancer Center, please come in thru the Main Entrance and check in at the main information desk.  Wear comfortable clothing and clothing appropriate for easy access to any Portacath or PICC line.   We strive to give you quality time with your provider. You may need to reschedule your appointment if you arrive late (15 or more minutes).  Arriving late affects you and other patients whose appointments are after yours.  Also, if you miss three or more appointments without notifying the office, you may be dismissed from the clinic at the provider's discretion.      For prescription refill requests, have your pharmacy contact our office and allow 72 hours for refills to be completed.    Today you received the following chemotherapy and/or immunotherapy agents Taxol      To help prevent nausea and vomiting after your treatment, we encourage you to take your nausea medication as directed.  BELOW ARE SYMPTOMS THAT SHOULD BE REPORTED IMMEDIATELY: *FEVER GREATER THAN 100.4 F (38 C) OR HIGHER *CHILLS OR SWEATING *NAUSEA AND VOMITING THAT IS NOT CONTROLLED WITH YOUR NAUSEA MEDICATION *UNUSUAL SHORTNESS OF BREATH *UNUSUAL BRUISING OR BLEEDING *URINARY PROBLEMS (pain or burning when urinating, or frequent urination) *BOWEL PROBLEMS (unusual diarrhea, constipation, pain near the anus) TENDERNESS IN MOUTH AND THROAT WITH OR WITHOUT PRESENCE OF ULCERS (sore throat, sores in mouth, or a toothache) UNUSUAL RASH, SWELLING OR PAIN  UNUSUAL VAGINAL DISCHARGE OR ITCHING   Items with * indicate a potential emergency and should be followed up as soon as possible or go to the Emergency Department if any problems should occur.  Please show the CHEMOTHERAPY ALERT CARD or IMMUNOTHERAPY ALERT CARD at check-in to the Emergency Department  and triage nurse.  Should you have questions after your visit or need to cancel or reschedule your appointment, please contact [facility] [phone]  and follow the prompts.  Office hours are 8:00 a.m. to 4:30 p.m. Monday - Friday. Please note that voicemails left after 4:00 p.m. may not be returned until the following business day.  We are closed weekends and major holidays. You have access to a nurse at all times for urgent questions. Please call the main number to the clinic [phone] and follow the prompts.  For any non-urgent questions, you may also contact your provider using MyChart. We now offer e-Visits for anyone 18 and older to request care online for non-urgent symptoms. For details visit [email or web address].   Also download the MyChart app! Go to the app store, search "MyChart", open the app, select [facility], and log in with your MyChart username and password.  Masks are optional in the cancer centers. If you would like for your care team to wear a mask while they are taking care of you, please let them know. For doctor visits, patients may have with them one support person who is at least 59 years old. At this time, visitors are not allowed in the infusion area.  Paclitaxel injection What is this medication? PACLITAXEL (PAK li TAX el) is a chemotherapy drug. It targets fast dividing cells, like cancer cells, and causes these cells to die. This medicine is used to treat ovarian cancer, breast cancer, lung cancer, Kaposi's sarcoma, and other cancers. This medicine may be used for other purposes; ask your health care provider   or pharmacist if you have questions. COMMON BRAND NAME(S): Onxol, Taxol What should I tell my care team before I take this medication? They need to know if you have any of these conditions: history of irregular heartbeat liver disease low blood counts, like low white cell, platelet, or red cell counts lung or breathing disease, like  asthma tingling of the fingers or toes, or other nerve disorder an unusual or allergic reaction to paclitaxel, alcohol, polyoxyethylated castor oil, other chemotherapy, other medicines, foods, dyes, or preservatives pregnant or trying to get pregnant breast-feeding How should I use this medication? This drug is given as an infusion into a vein. It is administered in a hospital or clinic by a specially trained health care professional. Talk to your pediatrician regarding the use of this medicine in children. Special care may be needed. Overdosage: If you think you have taken too much of this medicine contact a poison control center or emergency room at once. NOTE: This medicine is only for you. Do not share this medicine with others. What if I miss a dose? It is important not to miss your dose. Call your doctor or health care professional if you are unable to keep an appointment. What may interact with this medication? Do not take this medicine with any of the following medications: live virus vaccines This medicine may also interact with the following medications: antiviral medicines for hepatitis, HIV or AIDS certain antibiotics like erythromycin and clarithromycin certain medicines for fungal infections like ketoconazole and itraconazole certain medicines for seizures like carbamazepine, phenobarbital, phenytoin gemfibrozil nefazodone rifampin St. John's wort This list may not describe all possible interactions. Give your health care provider a list of all the medicines, herbs, non-prescription drugs, or dietary supplements you use. Also tell them if you smoke, drink alcohol, or use illegal drugs. Some items may interact with your medicine. What should I watch for while using this medication? Your condition will be monitored carefully while you are receiving this medicine. You will need important blood work done while you are taking this medicine. This medicine can cause serious allergic  reactions. To reduce your risk you will need to take other medicine(s) before treatment with this medicine. If you experience allergic reactions like skin rash, itching or hives, swelling of the face, lips, or tongue, tell your doctor or health care professional right away. In some cases, you may be given additional medicines to help with side effects. Follow all directions for their use. This drug may make you feel generally unwell. This is not uncommon, as chemotherapy can affect healthy cells as well as cancer cells. Report any side effects. Continue your course of treatment even though you feel ill unless your doctor tells you to stop. Call your doctor or health care professional for advice if you get a fever, chills or sore throat, or other symptoms of a cold or flu. Do not treat yourself. This drug decreases your body's ability to fight infections. Try to avoid being around people who are sick. This medicine may increase your risk to bruise or bleed. Call your doctor or health care professional if you notice any unusual bleeding. Be careful brushing and flossing your teeth or using a toothpick because you may get an infection or bleed more easily. If you have any dental work done, tell your dentist you are receiving this medicine. Avoid taking products that contain aspirin, acetaminophen, ibuprofen, naproxen, or ketoprofen unless instructed by your doctor. These medicines may hide a fever. Do not become   pregnant while taking this medicine. Women should inform their doctor if they wish to become pregnant or think they might be pregnant. There is a potential for serious side effects to an unborn child. Talk to your health care professional or pharmacist for more information. Do not breast-feed an infant while taking this medicine. Men are advised not to father a child while receiving this medicine. This product may contain alcohol. Ask your pharmacist or healthcare provider if this medicine contains  alcohol. Be sure to tell all healthcare providers you are taking this medicine. Certain medicines, like metronidazole and disulfiram, can cause an unpleasant reaction when taken with alcohol. The reaction includes flushing, headache, nausea, vomiting, sweating, and increased thirst. The reaction can last from 30 minutes to several hours. What side effects may I notice from receiving this medication? Side effects that you should report to your doctor or health care professional as soon as possible: allergic reactions like skin rash, itching or hives, swelling of the face, lips, or tongue breathing problems changes in vision fast, irregular heartbeat high or low blood pressure mouth sores pain, tingling, numbness in the hands or feet signs of decreased platelets or bleeding - bruising, pinpoint red spots on the skin, black, tarry stools, blood in the urine signs of decreased red blood cells - unusually weak or tired, feeling faint or lightheaded, falls signs of infection - fever or chills, cough, sore throat, pain or difficulty passing urine signs and symptoms of liver injury like dark yellow or brown urine; general ill feeling or flu-like symptoms; light-colored stools; loss of appetite; nausea; right upper belly pain; unusually weak or tired; yellowing of the eyes or skin swelling of the ankles, feet, hands unusually slow heartbeat Side effects that usually do not require medical attention (report to your doctor or health care professional if they continue or are bothersome): diarrhea hair loss loss of appetite muscle or joint pain nausea, vomiting pain, redness, or irritation at site where injected tiredness This list may not describe all possible side effects. Call your doctor for medical advice about side effects. You may report side effects to FDA at 1-800-FDA-1088. Where should I keep my medication? This drug is given in a hospital or clinic and will not be stored at home. NOTE: This  sheet is a summary. It may not cover all possible information. If you have questions about this medicine, talk to your doctor, pharmacist, or health care provider.  2023 [employer] Standard (2020-12-07 00:00:00)   

## 2021-07-04 NOTE — Progress Notes (Signed)
Pt presents today for Taxol per provider's order. Vital signs and labs WNL for treatment. Okay to proceed with treatment today per Dr.K.  Taxol given today per MD orders. Tolerated infusion without adverse affects. Vital signs stable. No complaints at this time. Discharged from clinic ambulatory in stable condition. Alert and oriented x 3. F/U with [facility] as scheduled.

## 2021-07-04 NOTE — Progress Notes (Signed)
Jessica [name], [place] [id number]   CLINIC:  Medical Oncology/Hematology  PCP:  Jessica Rival, FNP [address] [place] / [place] [address] [phone]   REASON FOR VISIT:  Follow-up for recurrent endometrial cancer  PRIOR THERAPY: TAH & BSO, bilateral pelvic and para-aortic lymph nodes dissections on 04/13/2018  NGS Results: not done  CURRENT THERAPY: Paclitaxel & Aloxi every 3 weeks  BRIEF ONCOLOGIC HISTORY:  Oncology History  Endometrial cancer ([facility] [facility])  03/31/2018 Initial Diagnosis   Endometrial cancer ([facility])   12/29/2018 -  Chemotherapy   Patient is on Treatment Plan : UTERINE Carboplatin AUC 6 / Paclitaxel q21d     06/06/2020 Miscellaneous   Summary of oncologic history from Dr. Delton [name]   1.  Recurrent endometrial carcinosarcoma: -TAH, BSO, bilateral pelvic and para-aortic lymph node resections on 04/13/2018. -Pathology showed carcinosarcoma (malignant mixed mullerian tumor) is arising in an endometrial type polyp with no myometrial invasion identified.  High-grade.  0/39 lymph nodes positive.  PT1APN0, FIGO stage Ia.  MMR normal.  MSI-stable. -CTAP on 12/09/2018 for abdominal pain showed extensive peritoneal carcinomatosis and large soft tissue mass in the pelvis. -Biopsy of the omental mass on 12/28/2018 shows poorly differentiated carcinoma consistent with her prior malignancy. -Carboplatin and paclitaxel started on 12/29/2018. -CT scan on 05/02/2019 showed peritoneal implants in the low central small bowel mesentery and left pelvic sidewall have decreased in size measuring 1.5 x 1.9 cm and 1.7 x 2.2 cm.  Soft tissue nodule in the left lateral omentum measures 1 x 1.7 cm with no evidence of metastatic disease. -Continuation of chemotherapy until complete response was recommended. -CT AP on 07/04/2019 showed continued positive response to therapy with scattered peritoneal metastasis in the pelvis and left omentum  decreased in size.  No new metastatic disease. -CTAP on 10/24/2019 after 14 cycles showed substantial reduction in size of soft tissue nodules in the pelvis.  1.4 x 0.9 x 1 cm soft tissue density in the inferior serosal margin of the sigmoid colon, previously 1.7 x 1.1 x 1.7 cm.  2 small soft tissue nodules in the mesenteric adipose tissue above the urinary bladder measures 0.8 cm. -She has developed serious allergic reaction during cycle 14 with carboplatin. -I have discussed with Dr. Denman [name].  Other options include adding ifosfamide to Taxol or Doxil and combination of lenvatinib with pembrolizumab.  In the lenvatinib pembrolizumab trial carcinosarcomas were not included. -Single agent paclitaxel started on 11/09/2019, dose reduced by 20% on 01/18/2020. -CTAP on 01/10/2020 with stable small peritoneal soft tissue nodules in the pelvis.  Resolution of soft tissue nodularity in the left abdominal omental fat with no new or progressive disease.     06/20/2020 Imaging   1. Stable examination status post hysterectomy without suspicious enhancing soft tissue nodularity at the vaginal cuff and no significant change in the multiple small soft tissue nodules in the pelvis likely representing treated tumor. No new or progressive findings. 2. Cholelithiasis including a large gallstone in the neck of the gallbladder but without CT evidence of acute cholecystitis. 3. Small hiatal hernia. 4. Aortic atherosclerosis.       CANCER STAGING:  Cancer Staging  No matching staging information was found for the patient.  INTERVAL HISTORY:  [name], a 61 y.o. female, returns for routine follow-up and consideration for next cycle of chemotherapy. Jessica [name] was last seen on 05/09/2021.  Due for cycle #41 of Taxol today.   Overall, she tells me  she has been feeling pretty well. She reports intermittent right calf pain which is improved with compression socks. She reports constant bilateral numbness in her  hands. The neuropathy in her feet is stable. She is taking percocet prn, about 4-5 days weekly. She reports diarrhea and nausea for a few days following treatment. Her appetite is good.   Overall, she feels ready for next cycle of chemo today.    REVIEW OF SYSTEMS:  Review of Systems  Constitutional:  Positive for fatigue. Negative for appetite change.  Respiratory:  Positive for shortness of breath.   Gastrointestinal:  Positive for diarrhea and nausea.  Musculoskeletal:  Positive for arthralgias (6/10 R leg).  Neurological:  Positive for numbness.  All other systems reviewed and are negative.   PAST MEDICAL/SURGICAL HISTORY:  Past Medical History:  Diagnosis Date   Cancer ([facility])    Phreesia 09/05/2019   Elevated blood pressure reading    Endometrial cancer ([facility])    Hyperlipidemia    Hypertension    PMB (postmenopausal bleeding)    Port-A-Cath in place 12/24/2018   Past Surgical History:  Procedure Laterality Date   ABDOMINAL HYSTERECTOMY N/A    Phreesia 09/05/2019   IR IMAGING GUIDED PORT INSERTION  12/28/2018   IR RADIOLOGIST EVAL & MGMT  06/02/2018   IR RADIOLOGIST EVAL & MGMT  06/16/2018   NECK SURGERY  2000   spinal surgery , titanium rod in place    ROBOTIC ASSISTED TOTAL HYSTERECTOMY WITH BILATERAL SALPINGO OOPHERECTOMY N/A 04/13/2018   Procedure: XI ROBOTIC [place];  Surgeon: Jessica Amber, MD;  Location: [facility];  Service: Gynecology;  Laterality: N/A;   ROBOTIC PELVIC AND PARA-AORTIC LYMPH NODE DISSECTION N/A 04/13/2018   Procedure: XI ROBOTIC PELVIC LYMPHADECTOMY AND PARA-AORTIC LYMPH NODE DISSECTION;  Surgeon: Jessica Amber, MD;  Location: [facility];  Service: Gynecology;  Laterality: N/A;    SOCIAL HISTORY:  Social History   Socioeconomic History   Marital status: Widowed    Spouse name: Not on file   Number of children: 3   Years of education: Not on file   Highest education level: Not on file  Occupational  History   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  Tobacco Use   Smoking status: Never   Smokeless tobacco: Never  Vaping Use   Vaping Use: Never used  Substance and Sexual Activity   Alcohol use: Yes    Alcohol/week: 4.0 standard drinks of alcohol    Types: 4 Glasses of wine per week    Comment: weekends   Drug use: Never   Sexual activity: Not Currently  Other Topics Concern   Not on file  Social History Narrative      Wears seat belt    Does not use phone while driving    Smoke detectors at home   Fire extinguisher-no   No weapons   Social Determinants of Health   Financial Resource Strain: Low Risk  (09/07/2019)   Overall Financial Resource Strain (CARDIA)    Difficulty of Paying Living Expenses: Not hard at all  Food Insecurity: No Food Insecurity (09/07/2019)   Hunger Vital Sign    Worried About Running Out of Food in the Last Year: Never true    Ran Out of Food in the Last Year: Never true  Transportation Needs: No Transportation Needs (09/07/2019)   PRAPARE - Hydrologist (Medical): No    Lack of Transportation (Non-Medical): No  Physical Activity: Inactive (01/18/2020)   Exercise Vital Sign    Days of Exercise per Week: 0 days    Minutes of Exercise per Session: 0 min  Stress: Stress Concern Present (09/07/2019)   [place]    Feeling of Stress : Rather much  Social Connections: Socially Isolated (09/07/2019)   Social Connection and Isolation Panel [NHANES]    Frequency of Communication with Friends and Family: More than three times a week    Frequency of Social Gatherings with Friends and Family: More than three times a week    Attends Religious Services: Never    Marine scientist or Organizations: No    Attends Archivist Meetings: Never    Marital Status: Widowed  Intimate Partner Violence: Not At Risk (09/07/2019)   Humiliation, Afraid, Rape,  and Kick questionnaire    Fear of Current or Ex-Partner: No    Emotionally Abused: No    Physically Abused: No    Sexually Abused: No    FAMILY HISTORY:  Family History  Problem Relation Age of Onset   Hypertension Mother    Breast cancer Mother    Hypertension Father    Heart disease Father    Cancer Father        lung   Cancer Sister        ovarian cancer   Melanoma Sister     CURRENT MEDICATIONS:  Current Outpatient Medications  Medication Sig Dispense Refill   amLODipine (NORVASC) 5 MG tablet Take 1 tablet (5 mg total) by mouth daily. 90 tablet 1   atorvastatin (LIPITOR) 20 MG tablet Take 1 tablet (20 mg total) by mouth daily. 90 tablet 1   Cholecalciferol (VITAMIN D3) 25 MCG (1000 UT) CAPS Take 1 capsule (1,000 Units total) by mouth daily. 60 capsule 2   cyclobenzaprine (FLEXERIL) 5 MG tablet Take 1 tablet (5 mg total) by mouth 3 (three) times daily as needed for muscle spasms. 30 tablet 1   EPINEPHrine 0.3 mg/0.3 mL IJ SOAJ injection Inject 0.3 mg into the muscle as needed for anaphylaxis. 1 each 0   gabapentin (NEURONTIN) 300 MG capsule Take 1 capsule (300 mg total) by mouth 3 (three) times daily. 90 capsule 6   lidocaine-prilocaine (EMLA) cream Apply to port site prior to use (Patient taking differently: 1 application. daily as needed (apply to port site prior to chemo).) 30 g 0   magnesium oxide (MAG-OX) 400 (240 Mg) MG tablet Take 1 tablet (400 mg total) by mouth 2 (two) times daily. 60 tablet 6   oxyCODONE-acetaminophen (PERCOCET) 5-325 MG tablet Take 1 tablet by mouth every 12 (twelve) hours as needed (pain). 60 tablet 0   PACLITAXEL IV Inject into the vein every 21 ( twenty-one) days.     prochlorperazine (COMPAZINE) 10 MG tablet Take 1 tablet (10 mg total) by mouth every 6 (six) hours as needed (Nausea or vomiting). 60 tablet 3   sertraline (ZOLOFT) 100 MG tablet Take 1 tablet by mouth once daily 30 tablet 5   sertraline (ZOLOFT) 100 MG tablet Take 1 tablet (100 mg  total) by mouth daily. 90 tablet 1   No current facility-administered medications for this visit.   Facility-Administered Medications Ordered in Other Visits  Medication Dose Route Frequency Provider Last Rate Last Admin   diphenhydrAMINE (BENADRYL) 50 MG/ML injection            palonosetron (ALOXI) 0.25 MG/5ML injection  ALLERGIES:  Allergies  Allergen Reactions   Carboplatin Other (See Comments)    "body on fire and abdominal pain"   Nickel Rash    itchy    PHYSICAL EXAM:  Performance status (ECOG): 1 - Symptomatic but completely ambulatory  There were no vitals filed for this visit. Wt Readings from Last 3 Encounters:  06/18/21 235 lb (106.6 kg)  05/30/21 229 lb 8 oz (104.1 kg)  05/09/21 231 lb 12.8 oz (105.1 kg)   Physical Exam Vitals reviewed.  Constitutional:      Appearance: Normal appearance. She is obese.  Cardiovascular:     Rate and Rhythm: Normal rate and regular rhythm.     Pulses: Normal pulses.     Heart sounds: Normal heart sounds.  Pulmonary:     Effort: Pulmonary effort is normal.     Breath sounds: Normal breath sounds.  Neurological:     General: No focal deficit present.     Mental Status: She is alert and oriented to person, place, and time.  Psychiatric:        Mood and Affect: Mood normal.        Behavior: Behavior normal.     LABORATORY DATA:  I have reviewed the labs as listed.     Latest Ref Rng & Units 05/30/2021    7:49 AM 05/09/2021    8:02 AM 04/18/2021    7:39 AM  CBC  WBC 4.0 - 10.5 K/uL 10.5  8.4  8.2   Hemoglobin 12.0 - 15.0 g/dL 11.8  11.5  11.1   Hematocrit 36.0 - 46.0 % 37.3  35.8  35.6   Platelets 150 - 400 K/uL 346  348  295       Latest Ref Rng & Units 05/30/2021    7:49 AM 05/09/2021    8:02 AM 04/18/2021    7:39 AM  CMP  Glucose 70 - 99 mg/dL 114  110  114   BUN 6 - 20 mg/dL 15  17  22    Creatinine 0.44 - 1.00 mg/dL 0.70  0.69  0.69   Sodium 135 - 145 mmol/L 140  138  139   Potassium 3.5 - 5.1  mmol/L 3.6  3.7  4.0   Chloride 98 - 111 mmol/L 109  107  107   CO2 22 - 32 mmol/L 25  23  24    Calcium 8.9 - 10.3 mg/dL 9.0  9.0  9.1   Total Protein 6.5 - 8.1 g/dL 7.2  7.3  7.0   Total Bilirubin 0.3 - 1.2 mg/dL 0.6  0.4  0.3   Alkaline Phos 38 - 126 U/L 120  118  93   AST 15 - 41 U/L 22  22  20    ALT 0 - 44 U/L 25  26  20      DIAGNOSTIC IMAGING:  I have independently reviewed the scans and discussed with the patient. MM 3D SCREEN BREAST BILATERAL  Result Date: 06/21/2021 CLINICAL DATA:  Screening. EXAM: DIGITAL SCREENING BILATERAL MAMMOGRAM WITH TOMOSYNTHESIS AND CAD TECHNIQUE: Bilateral screening digital craniocaudal and mediolateral oblique mammograms were obtained. Bilateral screening digital breast tomosynthesis was performed. The images were evaluated with computer-aided detection. COMPARISON:  Previous exam(s). ACR Breast Density Category b: There are scattered areas of fibroglandular density. FINDINGS: There are no findings suspicious for malignancy. IMPRESSION: No mammographic evidence of malignancy. A result letter of this screening mammogram will be mailed directly to the patient. RECOMMENDATION: Screening mammogram in one year. (Code:SM-B-01Y) BI-RADS CATEGORY  1: Negative. Electronically Signed   By: Franki Cabot M.D.   On: 06/21/2021 09:12     ASSESSMENT:  1.  Recurrent endometrial carcinosarcoma: -TAH, BSO, bilateral pelvic and para-aortic lymph node resections on 04/13/2018. -Pathology showed carcinosarcoma (malignant mixed mullerian tumor) is arising in an endometrial type polyp with no myometrial invasion identified.  High-grade.  0/39 lymph nodes positive.  PT1APN0, FIGO stage Ia.  MMR normal.  MSI-stable. -CTAP on 12/09/2018 for abdominal pain showed extensive peritoneal carcinomatosis and large soft tissue mass in the pelvis. -Biopsy of the omental mass on 12/28/2018 shows poorly differentiated carcinoma consistent with her prior malignancy. -Carboplatin and paclitaxel  started on 12/29/2018. -CT scan on 05/02/2019 showed peritoneal implants in the low central small bowel mesentery and left pelvic sidewall have decreased in size measuring 1.5 x 1.9 cm and 1.7 x 2.2 cm.  Soft tissue nodule in the left lateral omentum measures 1 x 1.7 cm with no evidence of metastatic disease. -Continuation of chemotherapy until complete response was recommended. -CT AP on 07/04/2019 showed continued positive response to therapy with scattered peritoneal metastasis in the pelvis and left omentum decreased in size.  No new metastatic disease. -CTAP on 10/24/2019 after 14 cycles showed substantial reduction in size of soft tissue nodules in the pelvis.  1.4 x 0.9 x 1 cm soft tissue density in the inferior serosal margin of the sigmoid colon, previously 1.7 x 1.1 x 1.7 cm.  2 small soft tissue nodules in the mesenteric adipose tissue above the urinary bladder measures 0.8 cm. -She has developed serious allergic reaction during cycle 14 with carboplatin. -I have discussed with Dr. Denman [name].  Other options include adding ifosfamide to Taxol or Doxil and combination of lenvatinib with pembrolizumab.  In the lenvatinib pembrolizumab trial carcinosarcomas were not included. -Single agent paclitaxel started on 11/09/2019, dose reduced by 20% on 01/18/2020. -CTAP on 01/10/2020 with stable small peritoneal soft tissue nodules in the pelvis.  Resolution of soft tissue nodularity in the left abdominal omental fat with no new or progressive disease.   2.  Breast abnormality: -Mammogram on 05/02/2019 shows BI-RADS Category 0.  She reportedly took Covid shot 1 to 2 weeks prior to the mammogram.  Further scans rescheduled on 06/21/2019.   3.  Peripheral neuropathy: -She reported numbness and occasional pins-and-needles sensation in the bilateral toes, right more than left.  This has started about a week ago.   PLAN:  1.  Recurrent endometrial carcinosarcoma: - CT CAP (03/27/2021): Stable exam with tiny soft  tissue nodules in the lower anatomic pelvis unchanged.  No definitive findings of metastatic disease. - Reviewed labs today which showed normal LFTs and CBC.  TSH was 3.1.  Last CA125 was 6.9. - Continue treatment today and in 3 weeks.  RTC 6 weeks for follow-up.  CTAP with contrast with tumor marker and labs.   2.  Low back pain/right-sided abdominal pain: - She has on and off bilateral hip pains. - Continue oxycodone 5 mg 4-5 times per week.   3.  Hypertension: - Continue Norvasc 5 mg daily.  Blood pressure is 130/80.   4.  Neuropathy: - She reports bilateral feet numbness which is constant.  Right lateral leg pain is also slightly prominent.  Compression socks is helping. - Continue gabapentin 300 mg in the morning and 600 mg at bedtime.  She does not take morning dose on days she works as a cause drowsiness.   5.  Abnormal mammogram: - Reviewed mammogram from 06/19/2021, BI-RADS  Category 1.  6.  Hypomagnesemia: - Continue magnesium 1 tablet daily.  Magnesium is normal.   Orders placed this encounter:  No orders of the defined types were placed in this encounter.    [name], MD [facility] [phone]   I, Thana Ates, am acting as a scribe for Dr. [name].    I, [name] MD, have reviewed the above documentation for accuracy and completeness, and I agree with the above.

## 2021-07-04 NOTE — Patient Instructions (Addendum)
[facility] Discharge Instructions   You were seen and examined today by Dr. Katragadda.  He reviewed the results of your lab work which is normal/stable.   We will proceed with your treatment today.  Return as scheduled.    Thank you for choosing [facility] at [facility] to provide your oncology and hematology care.  To afford each patient quality time with our provider, please arrive at least 15 minutes before your scheduled appointment time.   If you have a lab appointment with the Cancer Center please come in thru the Main Entrance and check in at the main information desk.  You need to re-schedule your appointment should you arrive 10 or more minutes late.  We strive to give you quality time with our providers, and arriving late affects you and other patients whose appointments are after yours.  Also, if you no show three or more times for appointments you may be dismissed from the clinic at the providers discretion.     Again, thank you for choosing [facility].  Our hope is that these requests will decrease the amount of time that you wait before being seen by our physicians.       _____________________________________________________________  Should you have questions after your visit to [facility], please contact our office at [phone] and follow the prompts.  Our office hours are 8:00 a.m. and 4:30 p.m. Monday - Friday.  Please note that voicemails left after 4:00 p.m. may not be returned until the following business day.  We are closed weekends and major holidays.  You do have access to a nurse 24-7, just call the main number to the clinic [phone] and do not press any options, hold on the line and a nurse will answer the phone.    For prescription refill requests, have your pharmacy contact our office and allow 72 hours.    Due to Covid, you will need to wear a mask upon entering the  hospital. If you do not have a mask, a mask will be given to you at the Main Entrance upon arrival. For doctor visits, patients may have 1 support person age 18 or older with them. For treatment visits, patients can not have anyone with them due to social distancing guidelines and our immunocompromised population.      

## 2021-07-25 ENCOUNTER — Other Ambulatory Visit: Payer: Self-pay

## 2021-07-25 ENCOUNTER — Telehealth: Payer: Self-pay

## 2021-07-25 ENCOUNTER — Encounter (HOSPITAL_COMMUNITY): Payer: Self-pay | Admitting: Hematology

## 2021-07-25 ENCOUNTER — Inpatient Hospital Stay (HOSPITAL_COMMUNITY): Payer: 59

## 2021-07-25 ENCOUNTER — Inpatient Hospital Stay (HOSPITAL_COMMUNITY): Payer: 59 | Attending: Hematology

## 2021-07-25 VITALS — BP 134/78 | HR 69 | Temp 97.8°F | Resp 20

## 2021-07-25 DIAGNOSIS — E785 Hyperlipidemia, unspecified: Secondary | ICD-10-CM

## 2021-07-25 DIAGNOSIS — I10 Essential (primary) hypertension: Secondary | ICD-10-CM

## 2021-07-25 DIAGNOSIS — Z95828 Presence of other vascular implants and grafts: Secondary | ICD-10-CM

## 2021-07-25 DIAGNOSIS — Z5111 Encounter for antineoplastic chemotherapy: Secondary | ICD-10-CM | POA: Diagnosis not present

## 2021-07-25 DIAGNOSIS — C786 Secondary malignant neoplasm of retroperitoneum and peritoneum: Secondary | ICD-10-CM | POA: Insufficient documentation

## 2021-07-25 DIAGNOSIS — C541 Malignant neoplasm of endometrium: Secondary | ICD-10-CM

## 2021-07-25 LAB — COMPREHENSIVE METABOLIC PANEL
ALT: 19 U/L (ref 0–44)
AST: 21 U/L (ref 15–41)
Albumin: 3.9 g/dL (ref 3.5–5.0)
Alkaline Phosphatase: 107 U/L (ref 38–126)
Anion gap: 6 (ref 5–15)
BUN: 22 mg/dL — ABNORMAL HIGH (ref 6–20)
CO2: 26 mmol/L (ref 22–32)
Calcium: 9.2 mg/dL (ref 8.9–10.3)
Chloride: 108 mmol/L (ref 98–111)
Creatinine, Ser: 0.69 mg/dL (ref 0.44–1.00)
GFR, Estimated: >60 mL/min (ref >60)
Glucose, Bld: 131 mg/dL — ABNORMAL HIGH (ref 70–99)
Potassium: 4 mmol/L (ref 3.5–5.1)
Sodium: 140 mmol/L (ref 135–145)
Total Bilirubin: 0.5 mg/dL (ref 0.3–1.2)
Total Protein: 7.2 g/dL (ref 6.5–8.1)

## 2021-07-25 LAB — CBC WITH DIFFERENTIAL/PLATELET
Abs Immature Granulocytes: 0.04 10*3/uL (ref 0.00–0.07)
Basophils Absolute: 0.1 10*3/uL (ref 0.0–0.1)
Basophils Relative: 1 %
Eosinophils Absolute: 0.2 10*3/uL (ref 0.0–0.5)
Eosinophils Relative: 2 %
HCT: 36.7 % (ref 36.0–46.0)
Hemoglobin: 11.9 g/dL — ABNORMAL LOW (ref 12.0–15.0)
Immature Granulocytes: 1 %
Lymphocytes Relative: 31 %
Lymphs Abs: 2.6 10*3/uL (ref 0.7–4.0)
MCH: 26.6 pg (ref 26.0–34.0)
MCHC: 32.4 g/dL (ref 30.0–36.0)
MCV: 81.9 fL (ref 80.0–100.0)
Monocytes Absolute: 0.7 10*3/uL (ref 0.1–1.0)
Monocytes Relative: 8 %
Neutro Abs: 4.8 10*3/uL (ref 1.7–7.7)
Neutrophils Relative %: 57 %
Platelets: 323 10*3/uL (ref 150–400)
RBC: 4.48 MIL/uL (ref 3.87–5.11)
RDW: 17.5 % — ABNORMAL HIGH (ref 11.5–15.5)
WBC: 8.3 10*3/uL (ref 4.0–10.5)
nRBC: 0 % (ref 0.0–0.2)

## 2021-07-25 LAB — MAGNESIUM: Magnesium: 1.8 mg/dL (ref 1.7–2.4)

## 2021-07-25 MED ORDER — HEPARIN SOD (PORK) LOCK FLUSH 100 UNIT/ML IV SOLN
500.0000 [IU] | Freq: Once | INTRAVENOUS | Status: AC | PRN
  Administered 2021-07-25: 500 [IU]

## 2021-07-25 MED ORDER — PALONOSETRON HCL INJECTION 0.25 MG/5ML
0.2500 mg | Freq: Once | INTRAVENOUS | Status: AC
  Administered 2021-07-25: 0.25 mg via INTRAVENOUS
  Filled 2021-07-25: qty 5

## 2021-07-25 MED ORDER — SODIUM CHLORIDE 0.9 % IV SOLN
10.0000 mg | Freq: Once | INTRAVENOUS | Status: AC
  Administered 2021-07-25: 10 mg via INTRAVENOUS
  Filled 2021-07-25: qty 10

## 2021-07-25 MED ORDER — FAMOTIDINE IN NACL 20-0.9 MG/50ML-% IV SOLN
20.0000 mg | Freq: Once | INTRAVENOUS | Status: AC
  Administered 2021-07-25: 20 mg via INTRAVENOUS
  Filled 2021-07-25: qty 50

## 2021-07-25 MED ORDER — SODIUM CHLORIDE 0.9 % IV SOLN
140.0000 mg/m2 | Freq: Once | INTRAVENOUS | Status: AC
  Administered 2021-07-25: 306 mg via INTRAVENOUS
  Filled 2021-07-25: qty 51

## 2021-07-25 MED ORDER — AMLODIPINE BESYLATE 5 MG PO TABS: 5.0000 mg | ORAL_TABLET | Freq: Every day | ORAL | 1 refills | Status: DC

## 2021-07-25 MED ORDER — DIPHENHYDRAMINE HCL 50 MG/ML IJ SOLN
25.0000 mg | Freq: Once | INTRAMUSCULAR | Status: AC
  Administered 2021-07-25: 25 mg via INTRAVENOUS
  Filled 2021-07-25: qty 1

## 2021-07-25 MED ORDER — SODIUM CHLORIDE 0.9% FLUSH
10.0000 mL | INTRAVENOUS | Status: DC | PRN
  Administered 2021-07-25: 10 mL

## 2021-07-25 MED ORDER — ATORVASTATIN CALCIUM 20 MG PO TABS: 20.0000 mg | ORAL_TABLET | Freq: Every day | ORAL | 1 refills | Status: DC

## 2021-07-25 MED ORDER — SODIUM CHLORIDE 0.9 % IV SOLN: Freq: Once | INTRAVENOUS | Status: AC

## 2021-07-25 NOTE — Patient Instructions (Signed)
[facility]  Discharge Instructions: Thank you for choosing [facility] to provide your oncology and hematology care.  If you have a lab appointment with the [facility], please come in thru the Main Entrance and check in at the main information desk.  Wear comfortable clothing and clothing appropriate for easy access to any Portacath or PICC line.   We strive to give you quality time with your provider. You may need to reschedule your appointment if you arrive late (15 or more minutes).  Arriving late affects you and other patients whose appointments are after yours.  Also, if you miss three or more appointments without notifying the office, you may be dismissed from the clinic at the provider's discretion.      For prescription refill requests, have your pharmacy contact our office and allow 72 hours for refills to be completed.    Today you received the following chemotherapy and/or immunotherapy agents Taxol      To help prevent nausea and vomiting after your treatment, we encourage you to take your nausea medication as directed.  BELOW ARE SYMPTOMS THAT SHOULD BE REPORTED IMMEDIATELY: *FEVER GREATER THAN 100.4 F (38 C) OR HIGHER *CHILLS OR SWEATING *NAUSEA AND VOMITING THAT IS NOT CONTROLLED WITH YOUR NAUSEA MEDICATION *UNUSUAL SHORTNESS OF BREATH *UNUSUAL BRUISING OR BLEEDING *URINARY PROBLEMS (pain or burning when urinating, or frequent urination) *BOWEL PROBLEMS (unusual diarrhea, constipation, pain near the anus) TENDERNESS IN MOUTH AND THROAT WITH OR WITHOUT PRESENCE OF ULCERS (sore throat, sores in mouth, or a toothache) UNUSUAL RASH, SWELLING OR PAIN  UNUSUAL VAGINAL DISCHARGE OR ITCHING   Items with * indicate a potential emergency and should be followed up as soon as possible or go to the Emergency Department if any problems should occur.  Please show the CHEMOTHERAPY ALERT CARD or IMMUNOTHERAPY ALERT CARD at check-in to the Emergency Department  and triage nurse.  Should you have questions after your visit or need to cancel or reschedule your appointment, please contact [facility] [phone]  and follow the prompts.  Office hours are 8:00 a.m. to 4:30 p.m. Monday - Friday. Please note that voicemails left after 4:00 p.m. may not be returned until the following business day.  We are closed weekends and major holidays. You have access to a nurse at all times for urgent questions. Please call the main number to the clinic [phone] and follow the prompts.  For any non-urgent questions, you may also contact your provider using MyChart. We now offer e-Visits for anyone 55 and older to request care online for non-urgent symptoms. For details visit [email or web address].   Also download the MyChart app! Go to the app store, search "MyChart", open the app, select [facility], and log in with your MyChart username and password.  Masks are optional in the cancer centers. If you would like for your care team to wear a mask while they are taking care of you, please let them know. For doctor visits, patients may have with them one support person who is at least 59 years old. At this time, visitors are not allowed in the infusion area.

## 2021-07-25 NOTE — Telephone Encounter (Signed)
Refills sent

## 2021-07-25 NOTE — Progress Notes (Signed)
Patient presents today for Taxol infusion per providers order.  Vital signs within parameters for treatment.  Labs pending.  Patient has no new complaints at this time.  Taxol given today per MD orders.  Stable during infusion without adverse affects.  Vital signs stable.  No complaints at this time.  Discharge from clinic ambulatory in stable condition.  Alert and oriented X 3.  Follow up with [facility] as scheduled.

## 2021-07-25 NOTE — Telephone Encounter (Signed)
Patient called need med refill, completely out.  atorvastatin (LIPITOR) 20 MG tablet   amLODipine (NORVASC) 5 MG tablet    Pharmacy: [facility]

## 2021-07-26 LAB — CA 125: Cancer Antigen (CA) 125: 8.7 U/mL (ref 0.0–38.1)

## 2021-08-02 ENCOUNTER — Encounter (HOSPITAL_COMMUNITY): Payer: Self-pay | Admitting: Hematology

## 2021-08-08 ENCOUNTER — Ambulatory Visit (HOSPITAL_COMMUNITY): Payer: Medicare Other

## 2021-08-09 ENCOUNTER — Ambulatory Visit (HOSPITAL_COMMUNITY): Payer: Medicare Other

## 2021-08-12 ENCOUNTER — Other Ambulatory Visit: Payer: Self-pay

## 2021-08-15 ENCOUNTER — Ambulatory Visit (HOSPITAL_COMMUNITY)
Admission: RE | Admit: 2021-08-15 | Discharge: 2021-08-15 | Disposition: A | Discharge status: Home / Self Care | Payer: 59 | Source: Ambulatory Visit | Attending: Hematology | Admitting: Hematology

## 2021-08-15 DIAGNOSIS — C541 Malignant neoplasm of endometrium: Secondary | ICD-10-CM | POA: Insufficient documentation

## 2021-08-15 MED ORDER — IOHEXOL 300 MG/ML  SOLN
100.0000 mL | Freq: Once | INTRAMUSCULAR | Status: AC | PRN
Start: 2021-08-15 — End: 2021-08-15
  Administered 2021-08-15: 100 mL via INTRAVENOUS

## 2021-08-19 ENCOUNTER — Other Ambulatory Visit (HOSPITAL_COMMUNITY): Payer: Self-pay | Admitting: *Deleted

## 2021-08-19 DIAGNOSIS — C541 Malignant neoplasm of endometrium: Secondary | ICD-10-CM

## 2021-08-19 MED ORDER — OXYCODONE-ACETAMINOPHEN 5-325 MG PO TABS: 1.0000 | ORAL_TABLET | Freq: Two times a day (BID) | ORAL | 0 refills | Status: DC | PRN

## 2021-08-22 ENCOUNTER — Inpatient Hospital Stay: Payer: 59

## 2021-08-22 ENCOUNTER — Inpatient Hospital Stay: Payer: 59 | Attending: Hematology | Admitting: Hematology

## 2021-08-22 VITALS — BP 140/70 | HR 73 | Temp 98.9°F | Resp 18

## 2021-08-22 DIAGNOSIS — Z95828 Presence of other vascular implants and grafts: Secondary | ICD-10-CM

## 2021-08-22 DIAGNOSIS — Z79899 Other long term (current) drug therapy: Secondary | ICD-10-CM | POA: Diagnosis not present

## 2021-08-22 DIAGNOSIS — Z90710 Acquired absence of both cervix and uterus: Secondary | ICD-10-CM | POA: Diagnosis not present

## 2021-08-22 DIAGNOSIS — Z9079 Acquired absence of other genital organ(s): Secondary | ICD-10-CM | POA: Diagnosis not present

## 2021-08-22 DIAGNOSIS — C786 Secondary malignant neoplasm of retroperitoneum and peritoneum: Secondary | ICD-10-CM | POA: Insufficient documentation

## 2021-08-22 DIAGNOSIS — C541 Malignant neoplasm of endometrium: Secondary | ICD-10-CM | POA: Insufficient documentation

## 2021-08-22 DIAGNOSIS — G629 Polyneuropathy, unspecified: Secondary | ICD-10-CM | POA: Insufficient documentation

## 2021-08-22 DIAGNOSIS — Z5111 Encounter for antineoplastic chemotherapy: Secondary | ICD-10-CM | POA: Diagnosis present

## 2021-08-22 DIAGNOSIS — M79604 Pain in right leg: Secondary | ICD-10-CM | POA: Diagnosis not present

## 2021-08-22 DIAGNOSIS — Z90722 Acquired absence of ovaries, bilateral: Secondary | ICD-10-CM | POA: Insufficient documentation

## 2021-08-22 DIAGNOSIS — I10 Essential (primary) hypertension: Secondary | ICD-10-CM | POA: Diagnosis not present

## 2021-08-22 LAB — CBC WITH DIFFERENTIAL/PLATELET
Abs Immature Granulocytes: 0.01 10*3/uL (ref 0.00–0.07)
Basophils Absolute: 0.1 10*3/uL (ref 0.0–0.1)
Basophils Relative: 1 %
Eosinophils Absolute: 0.2 10*3/uL (ref 0.0–0.5)
Eosinophils Relative: 3 %
HCT: 36.9 % (ref 36.0–46.0)
Hemoglobin: 12 g/dL (ref 12.0–15.0)
Immature Granulocytes: 0 %
Lymphocytes Relative: 30 %
Lymphs Abs: 2.3 10*3/uL (ref 0.7–4.0)
MCH: 26.5 pg (ref 26.0–34.0)
MCHC: 32.5 g/dL (ref 30.0–36.0)
MCV: 81.6 fL (ref 80.0–100.0)
Monocytes Absolute: 0.7 10*3/uL (ref 0.1–1.0)
Monocytes Relative: 9 %
Neutro Abs: 4.3 10*3/uL (ref 1.7–7.7)
Neutrophils Relative %: 57 %
Platelets: 294 10*3/uL (ref 150–400)
RBC: 4.52 MIL/uL (ref 3.87–5.11)
RDW: 17.8 % — ABNORMAL HIGH (ref 11.5–15.5)
WBC: 7.6 10*3/uL (ref 4.0–10.5)
nRBC: 0 % (ref 0.0–0.2)

## 2021-08-22 LAB — COMPREHENSIVE METABOLIC PANEL
ALT: 20 U/L (ref 0–44)
AST: 21 U/L (ref 15–41)
Albumin: 3.8 g/dL (ref 3.5–5.0)
Alkaline Phosphatase: 100 U/L (ref 38–126)
Anion gap: 7 (ref 5–15)
BUN: 18 mg/dL (ref 6–20)
CO2: 26 mmol/L (ref 22–32)
Calcium: 8.8 mg/dL — ABNORMAL LOW (ref 8.9–10.3)
Chloride: 107 mmol/L (ref 98–111)
Creatinine, Ser: 0.7 mg/dL (ref 0.44–1.00)
GFR, Estimated: >60 mL/min (ref >60)
Glucose, Bld: 109 mg/dL — ABNORMAL HIGH (ref 70–99)
Potassium: 3.5 mmol/L (ref 3.5–5.1)
Sodium: 140 mmol/L (ref 135–145)
Total Bilirubin: 0.5 mg/dL (ref 0.3–1.2)
Total Protein: 7 g/dL (ref 6.5–8.1)

## 2021-08-22 LAB — MAGNESIUM: Magnesium: 1.7 mg/dL (ref 1.7–2.4)

## 2021-08-22 MED ORDER — PREGABALIN 75 MG PO CAPS
75.0000 mg | ORAL_CAPSULE | Freq: Three times a day (TID) | ORAL | 3 refills | Status: DC
Start: 2021-08-22 — End: 2022-05-01

## 2021-08-22 MED ORDER — DIPHENHYDRAMINE HCL 50 MG/ML IJ SOLN
25.0000 mg | Freq: Once | INTRAMUSCULAR | Status: AC
  Administered 2021-08-22: 25 mg via INTRAVENOUS
  Filled 2021-08-22: qty 1

## 2021-08-22 MED ORDER — HEPARIN SOD (PORK) LOCK FLUSH 100 UNIT/ML IV SOLN
500.0000 [IU] | Freq: Once | INTRAVENOUS | Status: AC | PRN
  Administered 2021-08-22: 500 [IU]

## 2021-08-22 MED ORDER — SODIUM CHLORIDE 0.9 % IV SOLN
140.0000 mg/m2 | Freq: Once | INTRAVENOUS | Status: AC
  Administered 2021-08-22: 306 mg via INTRAVENOUS
  Filled 2021-08-22: qty 51

## 2021-08-22 MED ORDER — SODIUM CHLORIDE 0.9 % IV SOLN
10.0000 mg | Freq: Once | INTRAVENOUS | Status: AC
  Administered 2021-08-22: 10 mg via INTRAVENOUS
  Filled 2021-08-22: qty 10

## 2021-08-22 MED ORDER — SODIUM CHLORIDE 0.9 % IV SOLN: Freq: Once | INTRAVENOUS | Status: AC

## 2021-08-22 MED ORDER — FAMOTIDINE IN NACL 20-0.9 MG/50ML-% IV SOLN
20.0000 mg | Freq: Once | INTRAVENOUS | Status: AC
  Administered 2021-08-22: 20 mg via INTRAVENOUS
  Filled 2021-08-22: qty 50

## 2021-08-22 MED ORDER — SODIUM CHLORIDE 0.9% FLUSH
10.0000 mL | INTRAVENOUS | Status: DC | PRN
  Administered 2021-08-22: 10 mL

## 2021-08-22 MED ORDER — PALONOSETRON HCL INJECTION 0.25 MG/5ML
0.2500 mg | Freq: Once | INTRAVENOUS | Status: AC
  Administered 2021-08-22: 0.25 mg via INTRAVENOUS
  Filled 2021-08-22: qty 5

## 2021-08-22 NOTE — Progress Notes (Signed)
Patient presents today for 3 hour Taxol per providers order.  Vital signs within parameters for treatment.  Labs pending.  Patients only complaints is of right leg numbness and tingling.  Labs within parameters for treatment.

## 2021-08-22 NOTE — Patient Instructions (Signed)
[place] at [facility] Discharge Instructions   You were seen and examined today by Dr. Delton Coombes.  He reviewed the results of your CT scan which is stable.   He reviewed the results of your lab work which is normal/stable.  We will give treatment today. After today we will give you a 3 month break from treatment to see if this helps with your neuropathy.  We sent a prescription for Lyrica for neuropathy. Take it 3 times a day as prescribed. If insurance does not approve this then increase gabapentin to 600 mg three times a day.   Return as scheduled in 3 months. We will repeat blood work and scan prior to next visit.    Thank you for choosing [place] at [facility] to provide your oncology and hematology care.  To afford each patient quality time with our provider, please arrive at least 15 minutes before your scheduled appointment time.   If you have a lab appointment with the [place] [facility] please come in thru the Main Entrance and check in at the main information desk.  You need to re-schedule your appointment should you arrive 10 or more minutes late.  We strive to give you quality time with our providers, and arriving late affects you and other patients whose appointments are after yours.  Also, if you no show three or more times for appointments you may be dismissed from the clinic at the providers discretion.     Again, thank you for choosing [facility].  Our hope is that these requests will decrease the amount of time that you wait before being seen by our physicians.       _____________________________________________________________  Should you have questions after your visit to [facility], please contact our office at [phone] and follow the prompts.  Our office hours are 8:00 a.m. and 4:30 p.m. Monday - Friday.  Please note that voicemails left after 4:00 p.m. may not be returned until the  following business day.  We are closed weekends and major holidays.  You do have access to a nurse 24-7, just call the main number to the clinic [phone] and do not press any options, hold on the line and a nurse will answer the phone.    For prescription refill requests, have your pharmacy contact our office and allow 72 hours.    Due to Covid, you will need to wear a mask upon entering the hospital. If you do not have a mask, a mask will be given to you at the Main Entrance upon arrival. For doctor visits, patients may have 1 support person age 55 or older with them. For treatment visits, patients can not have anyone with them due to social distancing guidelines and our immunocompromised population.

## 2021-08-22 NOTE — Patient Instructions (Signed)
[name]  Discharge Instructions: Thank you for choosing [facility] to provide your oncology and hematology care.  If you have a lab appointment with the [facility], please come in thru the Main Entrance and check in at the main information desk.  Wear comfortable clothing and clothing appropriate for easy access to any Portacath or PICC line.   We strive to give you quality time with your provider. You may need to reschedule your appointment if you arrive late (15 or more minutes).  Arriving late affects you and other patients whose appointments are after yours.  Also, if you miss three or more appointments without notifying the office, you may be dismissed from the clinic at the provider's discretion.      For prescription refill requests, have your pharmacy contact our office and allow 72 hours for refills to be completed.    Today you received the following chemotherapy and/or immunotherapy agents Taxol      To help prevent nausea and vomiting after your treatment, we encourage you to take your nausea medication as directed.  BELOW ARE SYMPTOMS THAT SHOULD BE REPORTED IMMEDIATELY: *FEVER GREATER THAN 100.4 F (38 C) OR HIGHER *CHILLS OR SWEATING *NAUSEA AND VOMITING THAT IS NOT CONTROLLED WITH YOUR NAUSEA MEDICATION *UNUSUAL SHORTNESS OF BREATH *UNUSUAL BRUISING OR BLEEDING *URINARY PROBLEMS (pain or burning when urinating, or frequent urination) *BOWEL PROBLEMS (unusual diarrhea, constipation, pain near the anus) TENDERNESS IN MOUTH AND THROAT WITH OR WITHOUT PRESENCE OF ULCERS (sore throat, sores in mouth, or a toothache) UNUSUAL RASH, SWELLING OR PAIN  UNUSUAL VAGINAL DISCHARGE OR ITCHING   Items with * indicate a potential emergency and should be followed up as soon as possible or go to the Emergency Department if any problems should occur.  Please show the CHEMOTHERAPY ALERT CARD or IMMUNOTHERAPY ALERT CARD at check-in to the Emergency  Department and triage nurse.  Should you have questions after your visit or need to cancel or reschedule your appointment, please contact [facility] [phone]  and follow the prompts.  Office hours are 8:00 a.m. to 4:30 p.m. Monday - Friday. Please note that voicemails left after 4:00 p.m. may not be returned until the following business day.  We are closed weekends and major holidays. You have access to a nurse at all times for urgent questions. Please call the main number to the clinic [phone] and follow the prompts.  For any non-urgent questions, you may also contact your provider using MyChart. We now offer e-Visits for anyone 71 and older to request care online for non-urgent symptoms. For details visit [email or web address].   Also download the MyChart app! Go to the app store, search "MyChart", open the app, select [place], and log in with your MyChart username and password.  Masks are optional in the cancer centers. If you would like for your care team to wear a mask while they are taking care of you, please let them know. For doctor visits, patients may have with them one support person who is at least 59 years old. At this time, visitors are not allowed in the infusion area.

## 2021-08-22 NOTE — Progress Notes (Signed)
Jessica [name], [place] [id number]   CLINIC:  Medical Oncology/Hematology  PCP:  Jessica Rival, FNP [address] [place] / [place] [address] [phone]   REASON FOR VISIT:  Follow-up for recurrent endometrial cancer  PRIOR THERAPY: TAH & BSO, bilateral pelvic and para-aortic lymph nodes dissections on 04/13/2018  NGS Results: not done  CURRENT THERAPY: Paclitaxel & Aloxi every 3 weeks  BRIEF ONCOLOGIC HISTORY:  Oncology History  Endometrial cancer ([place])  03/31/2018 Initial Diagnosis   Endometrial cancer ([place])   12/29/2018 -  Chemotherapy   Patient is on Treatment Plan : UTERINE Carboplatin AUC 6 / Paclitaxel q21d     06/06/2020 Miscellaneous   Summary of oncologic history from Dr. Delton Coombes   1.  Recurrent endometrial carcinosarcoma: -TAH, BSO, bilateral pelvic and para-aortic lymph node resections on 04/13/2018. -Pathology showed carcinosarcoma (malignant mixed mullerian tumor) is arising in an endometrial type polyp with no myometrial invasion identified.  High-grade.  0/39 lymph nodes positive.  PT1APN0, FIGO stage Ia.  MMR normal.  MSI-stable. -CTAP on 12/09/2018 for abdominal pain showed extensive peritoneal carcinomatosis and large soft tissue mass in the pelvis. -Biopsy of the omental mass on 12/28/2018 shows poorly differentiated carcinoma consistent with her prior malignancy. -Carboplatin and paclitaxel started on 12/29/2018. -CT scan on 05/02/2019 showed peritoneal implants in the low central small bowel mesentery and left pelvic sidewall have decreased in size measuring 1.5 x 1.9 cm and 1.7 x 2.2 cm.  Soft tissue nodule in the left lateral omentum measures 1 x 1.7 cm with no evidence of metastatic disease. -Continuation of chemotherapy until complete response was recommended. -CT AP on 07/04/2019 showed continued positive response to therapy with scattered peritoneal metastasis in the pelvis and left omentum  decreased in size.  No new metastatic disease. -CTAP on 10/24/2019 after 14 cycles showed substantial reduction in size of soft tissue nodules in the pelvis.  1.4 x 0.9 x 1 cm soft tissue density in the inferior serosal margin of the sigmoid colon, previously 1.7 x 1.1 x 1.7 cm.  2 small soft tissue nodules in the mesenteric adipose tissue above the urinary bladder measures 0.8 cm. -She has developed serious allergic reaction during cycle 14 with carboplatin. -I have discussed with Dr. Denman George.  Other options include adding ifosfamide to Taxol or Doxil and combination of lenvatinib with pembrolizumab.  In the lenvatinib pembrolizumab trial carcinosarcomas were not included. -Single agent paclitaxel started on 11/09/2019, dose reduced by 20% on 01/18/2020. -CTAP on 01/10/2020 with stable small peritoneal soft tissue nodules in the pelvis.  Resolution of soft tissue nodularity in the left abdominal omental fat with no new or progressive disease.     06/20/2020 Imaging   1. Stable examination status post hysterectomy without suspicious enhancing soft tissue nodularity at the vaginal cuff and no significant change in the multiple small soft tissue nodules in the pelvis likely representing treated tumor. No new or progressive findings. 2. Cholelithiasis including a large gallstone in the neck of the gallbladder but without CT evidence of acute cholecystitis. 3. Small hiatal hernia. 4. Aortic atherosclerosis.       CANCER STAGING:  Cancer Staging  No matching staging information was found for the patient.  INTERVAL HISTORY:  Jessica [name], a 61 y.o. female, returns for routine follow-up and consideration for next cycle of chemotherapy. Jessica [name] was last seen on 05/09/2021.  Due for cycle #43 of Taxol today.   Overall, she tells me  she has been feeling pretty well. She reports constant pain in her right leg which has not been helped by Gabapentin.    Overall, she feels ready for next cycle of  chemo today.    REVIEW OF SYSTEMS:  Review of Systems  Constitutional:  Positive for fatigue. Negative for appetite change.  Respiratory:  Positive for cough.   Gastrointestinal:  Positive for diarrhea and nausea.  Musculoskeletal:  Positive for arthralgias (3/10 R leg).  All other systems reviewed and are negative.   PAST MEDICAL/SURGICAL HISTORY:  Past Medical History:  Diagnosis Date   Cancer ([place])    Phreesia 09/05/2019   Elevated blood pressure reading    Endometrial cancer ([facility])    Hyperlipidemia    Hypertension    PMB (postmenopausal bleeding)    Port-A-Cath in place 12/24/2018   Past Surgical History:  Procedure Laterality Date   ABDOMINAL HYSTERECTOMY N/A    Phreesia 09/05/2019   IR IMAGING GUIDED PORT INSERTION  12/28/2018   IR RADIOLOGIST EVAL & MGMT  06/02/2018   IR RADIOLOGIST EVAL & MGMT  06/16/2018   NECK SURGERY  2000   spinal surgery , titanium rod in place    ROBOTIC ASSISTED TOTAL HYSTERECTOMY WITH BILATERAL SALPINGO OOPHERECTOMY N/A 04/13/2018   Procedure: XI ROBOTIC [place];  Surgeon: Everitt Amber, MD;  Location: [facility];  Service: Gynecology;  Laterality: N/A;   ROBOTIC PELVIC AND PARA-AORTIC LYMPH NODE DISSECTION N/A 04/13/2018   Procedure: XI ROBOTIC PELVIC LYMPHADECTOMY AND PARA-AORTIC LYMPH NODE DISSECTION;  Surgeon: Everitt Amber, MD;  Location: [facility];  Service: Gynecology;  Laterality: N/A;    SOCIAL HISTORY:  Social History   Socioeconomic History   Marital status: Widowed    Spouse name: Not on file   Number of children: 3   Years of education: Not on file   Highest education level: Not on file  Occupational History   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  Tobacco Use   Smoking status: Never   Smokeless tobacco: Never  Vaping Use   Vaping Use: Never used  Substance and Sexual Activity   Alcohol use: Yes    Alcohol/week: 4.0 standard drinks of alcohol    Types: 4 Glasses of  wine per week    Comment: weekends   Drug use: Never   Sexual activity: Not Currently  Other Topics Concern   Not on file  Social History Narrative      Wears seat belt    Does not use phone while driving    Smoke detectors at home   Fire extinguisher-no   No weapons   Social Determinants of Health   Financial Resource Strain: Low Risk  (09/07/2019)   Overall Financial Resource Strain (CARDIA)    Difficulty of Paying Living Expenses: Not hard at all  Food Insecurity: No Food Insecurity (09/07/2019)   Hunger Vital Sign    Worried About Running Out of Food in the Last Year: Never true    Ran Out of Food in the Last Year: Never true  Transportation Needs: No Transportation Needs (09/07/2019)   PRAPARE - Hydrologist (Medical): No    Lack of Transportation (Non-Medical): No  Physical Activity: Inactive (01/18/2020)   Exercise Vital Sign    Days of Exercise per Week: 0 days    Minutes of Exercise per Session: 0 min  Stress: Stress Concern Present (09/07/2019)   [place] -  Occupational Stress Questionnaire    Feeling of Stress : Rather much  Social Connections: Socially Isolated (09/07/2019)   Social Connection and Isolation Panel [NHANES]    Frequency of Communication with Friends and Family: More than three times a week    Frequency of Social Gatherings with Friends and Family: More than three times a week    Attends Religious Services: Never    Marine scientist or Organizations: No    Attends Archivist Meetings: Never    Marital Status: Widowed  Intimate Partner Violence: Not At Risk (09/07/2019)   Humiliation, Afraid, Rape, and Kick questionnaire    Fear of Current or Ex-Partner: No    Emotionally Abused: No    Physically Abused: No    Sexually Abused: No    FAMILY HISTORY:  Family History  Problem Relation Age of Onset   Hypertension Mother    Breast cancer Mother    Hypertension Father     Heart disease Father    Cancer Father        lung   Cancer Sister        ovarian cancer   Melanoma Sister     CURRENT MEDICATIONS:  Current Outpatient Medications  Medication Sig Dispense Refill   amLODipine (NORVASC) 5 MG tablet Take 1 tablet (5 mg total) by mouth daily. 90 tablet 1   atorvastatin (LIPITOR) 20 MG tablet Take 1 tablet (20 mg total) by mouth daily. 90 tablet 1   Cholecalciferol (VITAMIN D3) 25 MCG (1000 UT) CAPS Take 1 capsule (1,000 Units total) by mouth daily. 60 capsule 2   cyclobenzaprine (FLEXERIL) 5 MG tablet Take 1 tablet (5 mg total) by mouth 3 (three) times daily as needed for muscle spasms. 30 tablet 1   EPINEPHrine 0.3 mg/0.3 mL IJ SOAJ injection Inject 0.3 mg into the muscle as needed for anaphylaxis. 1 each 0   gabapentin (NEURONTIN) 300 MG capsule Take 1 capsule (300 mg total) by mouth 3 (three) times daily. 90 capsule 6   lidocaine-prilocaine (EMLA) cream Apply to port site prior to use (Patient taking differently: 1 application  daily as needed (apply to port site prior to chemo).) 30 g 0   magnesium oxide (MAG-OX) 400 (240 Mg) MG tablet Take 1 tablet (400 mg total) by mouth 2 (two) times daily. 60 tablet 6   oxyCODONE-acetaminophen (PERCOCET) 5-325 MG tablet Take 1 tablet by mouth every 12 (twelve) hours as needed (pain). 60 tablet 0   PACLITAXEL IV Inject into the vein every 21 ( twenty-one) days.     prochlorperazine (COMPAZINE) 10 MG tablet Take 1 tablet (10 mg total) by mouth every 6 (six) hours as needed (Nausea or vomiting). 60 tablet 3   sertraline (ZOLOFT) 100 MG tablet Take 1 tablet by mouth once daily 30 tablet 5   sertraline (ZOLOFT) 100 MG tablet Take 1 tablet (100 mg total) by mouth daily. 90 tablet 1   No current facility-administered medications for this visit.   Facility-Administered Medications Ordered in Other Visits  Medication Dose Route Frequency Provider Last Rate Last Admin   diphenhydrAMINE (BENADRYL) 50 MG/ML injection             palonosetron (ALOXI) 0.25 MG/5ML injection             ALLERGIES:  Allergies  Allergen Reactions   Carboplatin Other (See Comments)    "body on fire and abdominal pain"   Nickel Rash    itchy    PHYSICAL EXAM:  Performance status (ECOG): 1 - Symptomatic but completely ambulatory  There were no vitals filed for this visit. Wt Readings from Last 3 Encounters:  08/22/21 229 lb 8 oz (104.1 kg)  07/25/21 230 lb 6.1 oz (104.5 kg)  07/04/21 229 lb 0.9 oz (103.9 kg)   Physical Exam Vitals reviewed.  Constitutional:      Appearance: Normal appearance. She is obese.  Cardiovascular:     Rate and Rhythm: Normal rate and regular rhythm.     Pulses: Normal pulses.     Heart sounds: Normal heart sounds.  Pulmonary:     Effort: Pulmonary effort is normal.     Breath sounds: Normal breath sounds.  Neurological:     General: No focal deficit present.     Mental Status: She is alert and oriented to person, place, and time.  Psychiatric:        Mood and Affect: Mood normal.        Behavior: Behavior normal.     LABORATORY DATA:  I have reviewed the labs as listed.     Latest Ref Rng & Units 07/25/2021    7:51 AM 07/04/2021    7:56 AM 05/30/2021    7:49 AM  CBC  WBC 4.0 - 10.5 K/uL 8.3  8.2  10.5   Hemoglobin 12.0 - 15.0 g/dL 11.9  12.5  11.8   Hematocrit 36.0 - 46.0 % 36.7  39.1  37.3   Platelets 150 - 400 K/uL 323  321  346       Latest Ref Rng & Units 07/25/2021    7:51 AM 07/04/2021    7:56 AM 05/30/2021    7:49 AM  CMP  Glucose 70 - 99 mg/dL 131  112  114   BUN 6 - 20 mg/dL _0 Creatinine 0.44 - 1.00 mg/dL 0.69  0.67  0.70   Sodium 135 - 145 mmol/L 140  141  140   Potassium 3.5 - 5.1 mmol/L 4.0  3.8  3.6   Chloride 98 - 111 mmol/L 108  109  109   CO2 22 - 32 mmol/L _1 Calcium 8.9 - 10.3 mg/dL 9.2  9.3  9.0   Total Protein 6.5 - 8.1 g/dL 7.2  7.4  7.2   Total Bilirubin 0.3 - 1.2 mg/dL 0.5  0.5  0.6   Alkaline Phos 38 - 126 U/L 107  114  120   AST 15 -  41 U/L _2 ALT 0 - 44 U/L _3 DIAGNOSTIC IMAGING:  I have independently reviewed the scans and discussed with the patient. CT Abdomen Pelvis W Contrast  Result Date: 08/15/2021 CLINICAL DATA:  History of ovarian cancer in a 61 year old female, follow-up assessment. * Tracking Code: BO * EXAM: CT ABDOMEN AND PELVIS WITH CONTRAST TECHNIQUE: Multidetector CT imaging of the abdomen and pelvis was performed using the standard protocol following bolus administration of intravenous contrast. RADIATION DOSE REDUCTION: This exam was performed according to the departmental dose-optimization program which includes automated exposure control, adjustment of the mA and/or kV according to patient size and/or use of iterative reconstruction technique. CONTRAST:  164m OMNIPAQUE IOHEXOL 300 MG/ML  SOLN COMPARISON:  March 26, 2021. FINDINGS: Lower chest: Unremarkable. Hepatobiliary: No focal, suspicious hepatic lesion. No pericholecystic stranding. No biliary duct dilation. Portal vein is patent. Gallstone in the neck of the gallbladder measuring 16 mm  is unchanged. There is cholelithiasis as on previous imaging faintly seen layering dependently in the gallbladder. Pancreas: Normal, without mass, inflammation or ductal dilatation. Spleen: Normal. Adrenals/Urinary Tract: Adrenal glands are unremarkable. Symmetric renal enhancement. No sign of hydronephrosis. No suspicious renal lesion or perinephric stranding. Urinary bladder is grossly unremarkable. Urinary bladder is collapsed limiting assessment. Stomach/Bowel: Small hiatal hernia. No signs of acute bowel process or acute gastric process. The appendix is normal. Vascular/Lymphatic: Aortic atherosclerosis. No sign of aneurysm. Smooth contour of the IVC. There is no gastrohepatic or hepatoduodenal ligament lymphadenopathy. No retroperitoneal or mesenteric lymphadenopathy. No pelvic sidewall lymphadenopathy. Atherosclerosis is mild. Reproductive: Post  hysterectomy without adnexal mass. Subtle nodule in the LEFT hemipelvis (image 72/2), approximately 10 mm not changed since previous imaging. Another small area of nodularity adjacent to adjacent small bowel loops is similarly stable. No abdominal or pelvic ascites. No omental nodularity currently. Other: No ascites. Musculoskeletal: No acute bone finding. No destructive bone process. Spinal degenerative changes. IMPRESSION: 1. No new or progressive findings with stable subtle nodularity in the pelvis. 2. No abdominal or pelvic ascites. 3. Cholelithiasis with gallstone in the neck of the gallbladder and gallbladder distension which is similar to previous imaging. 4. Cholelithiasis. 5. Small hiatal hernia. 6. Aortic atherosclerosis. Aortic Atherosclerosis (ICD10-I70.0). Electronically Signed   By: Zetta Bills M.D.   On: 08/15/2021 13:09     ASSESSMENT:  1.  Recurrent endometrial carcinosarcoma: -TAH, BSO, bilateral pelvic and para-aortic lymph node resections on 04/13/2018. -Pathology showed carcinosarcoma (malignant mixed mullerian tumor) is arising in an endometrial type polyp with no myometrial invasion identified.  High-grade.  0/39 lymph nodes positive.  PT1APN0, FIGO stage Ia.  MMR normal.  MSI-stable. -CTAP on 12/09/2018 for abdominal pain showed extensive peritoneal carcinomatosis and large soft tissue mass in the pelvis. -Biopsy of the omental mass on 12/28/2018 shows poorly differentiated carcinoma consistent with her prior malignancy. -Carboplatin and paclitaxel started on 12/29/2018. -CT scan on 05/02/2019 showed peritoneal implants in the low central small bowel mesentery and left pelvic sidewall have decreased in size measuring 1.5 x 1.9 cm and 1.7 x 2.2 cm.  Soft tissue nodule in the left lateral omentum measures 1 x 1.7 cm with no evidence of metastatic disease. -Continuation of chemotherapy until complete response was recommended. -CT AP on 07/04/2019 showed continued positive response to  therapy with scattered peritoneal metastasis in the pelvis and left omentum decreased in size.  No new metastatic disease. -CTAP on 10/24/2019 after 14 cycles showed substantial reduction in size of soft tissue nodules in the pelvis.  1.4 x 0.9 x 1 cm soft tissue density in the inferior serosal margin of the sigmoid colon, previously 1.7 x 1.1 x 1.7 cm.  2 small soft tissue nodules in the mesenteric adipose tissue above the urinary bladder measures 0.8 cm. -She has developed serious allergic reaction during cycle 14 with carboplatin. -I have discussed with Dr. Denman George.  Other options include adding ifosfamide to Taxol or Doxil and combination of lenvatinib with pembrolizumab.  In the lenvatinib pembrolizumab trial carcinosarcomas were not included. -Single agent paclitaxel started on 11/09/2019, dose reduced by 20% on 01/18/2020. -CTAP on 01/10/2020 with stable small peritoneal soft tissue nodules in the pelvis.  Resolution of soft tissue nodularity in the left abdominal omental fat with no new or progressive disease.   2.  Breast abnormality: -Mammogram on 05/02/2019 shows BI-RADS Category 0.  She reportedly took Covid shot 1 to 2 weeks prior to the mammogram.  Further scans rescheduled on  06/21/2019.   3.  Peripheral neuropathy: -She reported numbness and occasional pins-and-needles sensation in the bilateral toes, right more than left.  This has started about a week ago.   PLAN:  1.  Recurrent endometrial carcinosarcoma: - CT AP (08/15/2021): Reviewed by me stable subtle nodularity in the pelvis with no new findings. - Last CA125 was 8.7. - Reviewed labs today which showed normal CBC and mostly normal CMP. - We talked about the stability of CT scan for the last few times.  As her neuropathy is getting worse, I have recommended giving her a break from treatments after today.  She will come back in 3 months with repeat CTAP and tumor marker.   2.  Low back pain/right-sided abdominal pain: - She has  on and off bilateral hip pains.  Continue oxycodone as needed.   3.  Hypertension: - Continue Norvasc 5 mg daily.  Blood pressure today is 147/80.   4.  Neuropathy: - Neuropathic pain in the right leg has gotten worse since last treatment. - She is currently taking gabapentin 300 mg in the morning and 600 mg at bedtime. - She will not take the morning dose on the days she works as it makes her drowsy. - I have recommended switching to Lyrica 75 mg 3 times daily.  If it is not approved by insurance, will increase gabapentin to 600 mg 3 times daily. - She is requiring oxycodone 5 mg 1 tablet daily.   5.  Abnormal mammogram: - Mammogram on 06/19/2021 with BI-RADS Category 1.  6.  Hypomagnesemia: - Continue magnesium 1 tablet daily.  Magnesium is normal.   Orders placed this encounter:  No orders of the defined types were placed in this encounter.    [name], MD [place] [phone]   I, Thana Ates, am acting as a scribe for Dr. [name].  I, [name] MD, have reviewed the above documentation for accuracy and completeness, and I agree with the above.

## 2021-08-22 NOTE — Progress Notes (Signed)
Treatment given per orders. Patient tolerated it well without problems. Vitals stable and discharged home from clinic ambulatory. Follow up as scheduled.  

## 2021-08-30 ENCOUNTER — Encounter: Payer: Self-pay | Admitting: Hematology

## 2021-10-02 ENCOUNTER — Ambulatory Visit (INDEPENDENT_AMBULATORY_CARE_PROVIDER_SITE_OTHER): Payer: 59

## 2021-10-02 DIAGNOSIS — Z23 Encounter for immunization: Secondary | ICD-10-CM | POA: Diagnosis not present

## 2021-10-21 ENCOUNTER — Other Ambulatory Visit: Payer: Self-pay | Admitting: *Deleted

## 2021-10-21 DIAGNOSIS — C541 Malignant neoplasm of endometrium: Secondary | ICD-10-CM

## 2021-10-21 MED ORDER — OXYCODONE-ACETAMINOPHEN 5-325 MG PO TABS: 1.0000 | ORAL_TABLET | Freq: Two times a day (BID) | ORAL | 0 refills | Status: DC | PRN

## 2021-10-28 ENCOUNTER — Other Ambulatory Visit (HOSPITAL_COMMUNITY): Payer: Self-pay | Admitting: Hematology

## 2021-11-20 ENCOUNTER — Encounter (HOSPITAL_COMMUNITY): Payer: Self-pay | Admitting: Emergency Medicine

## 2021-11-20 ENCOUNTER — Emergency Department (HOSPITAL_COMMUNITY)
Admission: RE | Admit: 2021-11-20 | Discharge: 2021-11-20 | Disposition: A | Discharge status: Home / Self Care | Payer: 59 | Source: Ambulatory Visit | Attending: Hematology | Admitting: Hematology

## 2021-11-20 ENCOUNTER — Other Ambulatory Visit: Payer: Self-pay

## 2021-11-20 ENCOUNTER — Emergency Department (HOSPITAL_COMMUNITY): Payer: 59

## 2021-11-20 ENCOUNTER — Inpatient Hospital Stay: Payer: 59 | Attending: Hematology

## 2021-11-20 ENCOUNTER — Emergency Department (HOSPITAL_COMMUNITY)
Admission: EM | Admit: 2021-11-20 | Discharge: 2021-11-20 | Disposition: A | Discharge status: Home / Self Care | Payer: 59 | Attending: Emergency Medicine | Admitting: Emergency Medicine

## 2021-11-20 VITALS — BP 147/83 | HR 77 | Temp 97.6°F | Resp 18

## 2021-11-20 DIAGNOSIS — Z95828 Presence of other vascular implants and grafts: Secondary | ICD-10-CM

## 2021-11-20 DIAGNOSIS — C541 Malignant neoplasm of endometrium: Secondary | ICD-10-CM | POA: Insufficient documentation

## 2021-11-20 DIAGNOSIS — Y92009 Unspecified place in unspecified non-institutional (private) residence as the place of occurrence of the external cause: Secondary | ICD-10-CM | POA: Diagnosis not present

## 2021-11-20 DIAGNOSIS — C786 Secondary malignant neoplasm of retroperitoneum and peritoneum: Secondary | ICD-10-CM | POA: Insufficient documentation

## 2021-11-20 DIAGNOSIS — Z90710 Acquired absence of both cervix and uterus: Secondary | ICD-10-CM | POA: Insufficient documentation

## 2021-11-20 DIAGNOSIS — C772 Secondary and unspecified malignant neoplasm of intra-abdominal lymph nodes: Secondary | ICD-10-CM | POA: Insufficient documentation

## 2021-11-20 DIAGNOSIS — S80211A Abrasion, right knee, initial encounter: Secondary | ICD-10-CM | POA: Insufficient documentation

## 2021-11-20 DIAGNOSIS — S39012A Strain of muscle, fascia and tendon of lower back, initial encounter: Secondary | ICD-10-CM | POA: Diagnosis not present

## 2021-11-20 DIAGNOSIS — Z79899 Other long term (current) drug therapy: Secondary | ICD-10-CM | POA: Insufficient documentation

## 2021-11-20 DIAGNOSIS — E785 Hyperlipidemia, unspecified: Secondary | ICD-10-CM | POA: Insufficient documentation

## 2021-11-20 DIAGNOSIS — Y30XXXA Falling, jumping or pushed from a high place, undetermined intent, initial encounter: Secondary | ICD-10-CM | POA: Diagnosis not present

## 2021-11-20 DIAGNOSIS — Z90722 Acquired absence of ovaries, bilateral: Secondary | ICD-10-CM | POA: Insufficient documentation

## 2021-11-20 DIAGNOSIS — Z9079 Acquired absence of other genital organ(s): Secondary | ICD-10-CM | POA: Insufficient documentation

## 2021-11-20 DIAGNOSIS — I10 Essential (primary) hypertension: Secondary | ICD-10-CM | POA: Insufficient documentation

## 2021-11-20 DIAGNOSIS — S3992XA Unspecified injury of lower back, initial encounter: Secondary | ICD-10-CM | POA: Diagnosis present

## 2021-11-20 DIAGNOSIS — W19XXXA Unspecified fall, initial encounter: Secondary | ICD-10-CM

## 2021-11-20 LAB — CBC WITH DIFFERENTIAL/PLATELET
Abs Immature Granulocytes: 0.01 10*3/uL (ref 0.00–0.07)
Basophils Absolute: 0.1 10*3/uL (ref 0.0–0.1)
Basophils Relative: 1 %
Eosinophils Absolute: 0.1 10*3/uL (ref 0.0–0.5)
Eosinophils Relative: 2 %
HCT: 36.1 % (ref 36.0–46.0)
Hemoglobin: 11.9 g/dL — ABNORMAL LOW (ref 12.0–15.0)
Immature Granulocytes: 0 %
Lymphocytes Relative: 37 %
Lymphs Abs: 2.9 10*3/uL (ref 0.7–4.0)
MCH: 26.9 pg (ref 26.0–34.0)
MCHC: 33 g/dL (ref 30.0–36.0)
MCV: 81.5 fL (ref 80.0–100.0)
Monocytes Absolute: 0.8 10*3/uL (ref 0.1–1.0)
Monocytes Relative: 10 %
Neutro Abs: 4.1 10*3/uL (ref 1.7–7.7)
Neutrophils Relative %: 50 %
Platelets: 315 10*3/uL (ref 150–400)
RBC: 4.43 MIL/uL (ref 3.87–5.11)
RDW: 15.9 % — ABNORMAL HIGH (ref 11.5–15.5)
WBC: 7.9 10*3/uL (ref 4.0–10.5)
nRBC: 0 % (ref 0.0–0.2)

## 2021-11-20 LAB — COMPREHENSIVE METABOLIC PANEL
ALT: 40 U/L (ref 0–44)
AST: 31 U/L (ref 15–41)
Albumin: 3.8 g/dL (ref 3.5–5.0)
Alkaline Phosphatase: 118 U/L (ref 38–126)
Anion gap: 11 (ref 5–15)
BUN: 15 mg/dL (ref 6–20)
CO2: 24 mmol/L (ref 22–32)
Calcium: 8.9 mg/dL (ref 8.9–10.3)
Chloride: 102 mmol/L (ref 98–111)
Creatinine, Ser: 0.55 mg/dL (ref 0.44–1.00)
GFR, Estimated: >60 mL/min (ref >60)
Glucose, Bld: 115 mg/dL — ABNORMAL HIGH (ref 70–99)
Potassium: 3.5 mmol/L (ref 3.5–5.1)
Sodium: 137 mmol/L (ref 135–145)
Total Bilirubin: 0.6 mg/dL (ref 0.3–1.2)
Total Protein: 7.2 g/dL (ref 6.5–8.1)

## 2021-11-20 LAB — MAGNESIUM: Magnesium: 1.8 mg/dL (ref 1.7–2.4)

## 2021-11-20 MED ORDER — HEPARIN SOD (PORK) LOCK FLUSH 100 UNIT/ML IV SOLN
INTRAVENOUS | Status: AC
  Administered 2021-11-20: 500 [IU] via INTRAVENOUS
  Filled 2021-11-20: qty 5

## 2021-11-20 MED ORDER — SODIUM CHLORIDE 0.9% FLUSH
10.0000 mL | Freq: Once | INTRAVENOUS | Status: AC
  Administered 2021-11-20: 10 mL via INTRAVENOUS

## 2021-11-20 MED ORDER — OXYCODONE-ACETAMINOPHEN 5-325 MG PO TABS
2.0000 | ORAL_TABLET | Freq: Once | ORAL | Status: AC
  Administered 2021-11-20: 2 via ORAL
  Filled 2021-11-20: qty 2

## 2021-11-20 MED ORDER — IOHEXOL 300 MG/ML  SOLN
100.0000 mL | Freq: Once | INTRAMUSCULAR | Status: AC | PRN
  Administered 2021-11-20: 100 mL via INTRAVENOUS

## 2021-11-20 MED ORDER — HEPARIN SOD (PORK) LOCK FLUSH 100 UNIT/ML IV SOLN: 500.0000 [IU] | Freq: Once | INTRAVENOUS | Status: AC

## 2021-11-20 NOTE — ED Provider Notes (Signed)
[facility] EMERGENCY DEPARTMENT Provider Note   CSN: [id number] Arrival date & time: 11/20/21  6503     History  Chief Complaint  Patient presents with   Lytle Michaels    Jessica [name] is a 61 y.o. female.  HPI 60 year old female presents after a fall.  Last night her neighbor who is in a wheelchair started to go off of the ramp at her house and so she jumped in front of the wheelchair to prevent it from falling.  She and the wheelchair in the other person fell to the ground.  She is not sure which part of her got hurt but it seems she tried to stand up she could feel severe back pain.  She took oxycodone and Tylenol last night.  Primarily she is having right-sided back pain, right knee pain.  She denies any headache or head injury.  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  Home Medications Prior to Admission medications   Medication Sig Start Date End Date Taking? Authorizing Provider  amLODipine (NORVASC) 5 MG tablet Take 1 tablet (5 mg total) by mouth daily. 07/25/21   Paseda, Dewaine Conger, FNP  atorvastatin (LIPITOR) 20 MG tablet Take 1 tablet (20 mg total) by mouth daily. 07/25/21   Renee Rival, FNP  Cholecalciferol (VITAMIN D3) 25 MCG (1000 UT) CAPS Take 1 capsule (1,000 Units total) by mouth daily. 06/18/21   Paseda, Dewaine Conger, FNP  cyclobenzaprine (FLEXERIL) 5 MG tablet Take 1 tablet (5 mg total) by mouth 3 (three) times daily as needed for muscle spasms. 02/11/21   Renee Rival, FNP  EPINEPHrine 0.3 mg/0.3 mL IJ SOAJ injection Inject 0.3 mg into the muscle as needed for anaphylaxis. 10/05/19   Evalee Jefferson, PA-C  gabapentin (NEURONTIN) 300 MG capsule Take 1 capsule (300 mg total) by mouth 3 (three) times daily. 01/29/21   [name], MD  lidocaine-prilocaine (EMLA) cream Apply to port site prior to use Patient taking differently: 1 application  daily as needed (apply to port site prior to chemo). 08/09/20   [name], MD  MAGNESIUM-OXIDE 400 (240 Mg) MG tablet Take 1 tablet by mouth twice daily 10/29/21   [name], MD  oxyCODONE-acetaminophen (PERCOCET) 5-325 MG tablet Take 1 tablet by mouth every 12 (twelve) hours as needed (pain). 10/21/21   [name], MD  PACLITAXEL IV Inject into the vein every 21 ( twenty-one) days. 12/29/18   [provider]  pregabalin (LYRICA) 75 MG capsule Take 1 capsule (75 mg total) by mouth 3 (three) times daily. 08/22/21   [name], MD  prochlorperazine (COMPAZINE) 10 MG tablet Take 1 tablet (10 mg total) by mouth every 6 (six) hours as needed (Nausea or vomiting). 06/06/20   [name], MD  sertraline (ZOLOFT) 100 MG tablet Take 1 tablet by mouth once daily 06/21/21   Renee Rival, FNP      Allergies    Carboplatin and Nickel    Review of Systems   Review of Systems  Gastrointestinal:  Negative for abdominal pain.  Musculoskeletal:  Positive for arthralgias and back pain.  Neurological:  Negative for weakness, numbness and headaches.    Physical Exam Updated Vital Signs BP (!) 172/79 (BP Location: Left Arm)   Pulse 85   Temp 98.6 F (37 C)   Resp 19   Ht '5\' 4"'$  (1.626 m)   Wt 104.3 kg   SpO2 91%   BMI 39.48 kg/m  Physical Exam  Vitals and nursing note reviewed.  Constitutional:      Appearance: She is well-developed.  HENT:     Head: Normocephalic and atraumatic.  Cardiovascular:     Rate and Rhythm: Normal rate and regular rhythm.     Pulses:          Dorsalis pedis pulses are 2+ on the right side.  Pulmonary:     Effort: Pulmonary effort is normal.  Abdominal:     General: There is no distension.     Palpations: Abdomen is soft.     Tenderness: There is no abdominal tenderness.  Musculoskeletal:     Lumbar back: Tenderness present.       Back:     Right knee: Normal range of motion. Tenderness (mild) present.     Right lower leg: No tenderness or bony tenderness.     Right ankle: No  tenderness. Normal range of motion.     Right foot: No tenderness.       Legs:  Skin:    General: Skin is warm and dry.  Neurological:     Mental Status: She is alert.     Comments: Normal strength in BLE.     ED Results / Procedures / Treatments   Labs (all labs ordered are listed, but only abnormal results are displayed) Labs Reviewed - No data to display  EKG None  Radiology DG Pelvis 1-2 Views  Result Date: 11/20/2021 CLINICAL DATA:  [place] last night, RIGHT lower back pain, difficulty ambulating, muscle spasms EXAM: PELVIS - 1-2 VIEW COMPARISON:  None Available. FINDINGS: Osseous demineralization. SI joints and LEFT hip joint space preserved. Marked narrowing of superior aspect of RIGHT hip joint with bone-on-bone appearance and minimal spur formation. No fracture, dislocation, or bone destruction. Degenerative disc and facet disease changes at visualized lower lumbar spine. IMPRESSION: Degenerative changes of RIGHT hip joint and lower lumbar spine. No acute abnormalities. Electronically Signed   By: Lavonia Dana M.D.   On: 11/20/2021 08:17   DG Lumbar Spine Complete  Result Date: 11/20/2021 CLINICAL DATA:  [place] last night, RIGHT low back pain, difficulty ambulating, muscle spasms EXAM: LUMBAR SPINE - COMPLETE 4+ VIEW COMPARISON:  None available FINDINGS: Five non-rib-bearing lumbar vertebra. Bones appear demineralized. Facet degenerative changes lower lumbar spine. Multilevel disc space narrowing. Vertebral body heights maintained without fracture, subluxation, bone destruction, or spondylolysis. SI joints preserved. IMPRESSION: Degenerative disc and facet disease changes of lower lumbar spine. No acute abnormalities. Electronically Signed   By: Lavonia Dana M.D.   On: 11/20/2021 08:16   DG Ribs Unilateral W/Chest Right  Result Date: 11/20/2021 CLINICAL DATA:  Pain post fall EXAM: RIGHT RIBS AND CHEST - 3+ VIEW COMPARISON:  02/07/2021 FINDINGS: Stable right IJ port catheter to the  cavoatrial junction. Cervical fixation hardware noted. Lungs clear. Heart size normal. No pneumothorax or effusion. No displaced rib fracture. Anterior vertebral endplate spurring at multiple levels in the lower thoracic spine. IMPRESSION: No displaced rib fracture or other acute finding. Electronically Signed   By: Lucrezia Europe M.D.   On: 11/20/2021 08:15   DG Knee Complete 4 Views Right  Result Date: 11/20/2021 CLINICAL DATA:  Fall last night.  Difficulty ambulating. EXAM: RIGHT KNEE - COMPLETE 4+ VIEW COMPARISON:  None Available. FINDINGS: There is no acute fracture or dislocation. Knee alignment is normal. There is mild medial and lateral compartment joint space narrowing with minimal osteophytosis. The soft tissues are unremarkable. There is no effusion. IMPRESSION: No acute fracture or  dislocation. Electronically Signed   By: Valetta Mole M.D.   On: 11/20/2021 08:15    Procedures Procedures    Medications Ordered in ED Medications  oxyCODONE-acetaminophen (PERCOCET/ROXICET) 5-325 MG per tablet 2 tablet (2 tablets Oral Given 11/20/21 3149)    ED Course/ Medical Decision Making/ A&P                           Medical Decision Making Amount and/or Complexity of Data Reviewed External Data Reviewed: notes. Radiology: ordered and independent interpretation performed.    Details: No rib fractures or pneumothorax.  No fractures to the pelvis, L-spine or knee.  Risk Prescription drug management.   Patient presents after a fall/direct trauma from the wheelchair.  Based on negative x-rays and exam this is most likely muscular/contusion.  She was given oxycodone for pain.  She has oxycodone at home due to her cancer.  Otherwise, no signs or symptoms of obvious spinal cord emergency and I doubt occult fracture.  She has been ambulatory since this happened.  No head trauma.  At this point she appears stable for discharge home to follow-up with PCP as needed and she was given return precautions.  Neurovascularly intact.        Final Clinical Impression(s) / ED Diagnoses Final diagnoses:  Fall, initial encounter  Strain of lumbar region, initial encounter    Rx / DC Orders ED Discharge Orders     None         Sherwood Gambler, MD 11/20/21 [phone]

## 2021-11-20 NOTE — Patient Instructions (Signed)
[facility]  Discharge Instructions: Thank you for choosing [facility] to provide your oncology and hematology care.  If you have a lab appointment with the [facility], please come in thru the Main Entrance and check in at the main information desk.  Wear comfortable clothing and clothing appropriate for easy access to any Portacath or PICC line.   We strive to give you quality time with your provider. You may need to reschedule your appointment if you arrive late (15 or more minutes).  Arriving late affects you and other patients whose appointments are after yours.  Also, if you miss three or more appointments without notifying the office, you may be dismissed from the clinic at the provider's discretion.      For prescription refill requests, have your pharmacy contact our office and allow 72 hours for refills to be completed.    Today you received the following port flushed and labs drawn, return as scheduled.   To help prevent nausea and vomiting after your treatment, we encourage you to take your nausea medication as directed.  BELOW ARE SYMPTOMS THAT SHOULD BE REPORTED IMMEDIATELY: *FEVER GREATER THAN 100.4 F (38 C) OR HIGHER *CHILLS OR SWEATING *NAUSEA AND VOMITING THAT IS NOT CONTROLLED WITH YOUR NAUSEA MEDICATION *UNUSUAL SHORTNESS OF BREATH *UNUSUAL BRUISING OR BLEEDING *URINARY PROBLEMS (pain or burning when urinating, or frequent urination) *BOWEL PROBLEMS (unusual diarrhea, constipation, pain near the anus) TENDERNESS IN MOUTH AND THROAT WITH OR WITHOUT PRESENCE OF ULCERS (sore throat, sores in mouth, or a toothache) UNUSUAL RASH, SWELLING OR PAIN  UNUSUAL VAGINAL DISCHARGE OR ITCHING   Items with * indicate a potential emergency and should be followed up as soon as possible or go to the Emergency Department if any problems should occur.  Please show the CHEMOTHERAPY ALERT CARD or IMMUNOTHERAPY ALERT CARD at check-in to the Emergency  Department and triage nurse.  Should you have questions after your visit or need to cancel or reschedule your appointment, please contact [facility] [phone]  and follow the prompts.  Office hours are 8:00 a.m. to 4:30 p.m. Monday - Friday. Please note that voicemails left after 4:00 p.m. may not be returned until the following business day.  We are closed weekends and major holidays. You have access to a nurse at all times for urgent questions. Please call the main number to the clinic [phone] and follow the prompts.  For any non-urgent questions, you may also contact your provider using MyChart. We now offer e-Visits for anyone 27 and older to request care online for non-urgent symptoms. For details visit [email or web address].   Also download the MyChart app! Go to the app store, search "MyChart", open the app, select [facility], and log in with your MyChart username and password.  Masks are optional in the cancer centers. If you would like for your care team to wear a mask while they are taking care of you, please let them know. You may have one support person who is at least 59 years old accompany you for your appointments.

## 2021-11-20 NOTE — ED Notes (Signed)
Patient transported to X-ray 

## 2021-11-20 NOTE — Discharge Instructions (Signed)
If you develop new or worsening back pain, severe headache, weakness or numbness in extremities, bowel or bladder incontinence, or any other new/concerning symptoms then return to the ER for evaluation.

## 2021-11-20 NOTE — ED Triage Notes (Signed)
Pt c/o lower back pain after jumping in front of wheelchair to stop it and she fell. Pt c/o right lower back pain and right leg pain.

## 2021-11-20 NOTE — Progress Notes (Signed)
Port flushed with good blood return noted labs drawn. No bruising or swelling at site. Bandaid applied and patient discharged in satisfactory condition. VVS stable with no signs or symptoms of distressed noted.

## 2021-11-23 ENCOUNTER — Other Ambulatory Visit (HOSPITAL_COMMUNITY): Payer: Self-pay | Admitting: Hematology

## 2021-11-25 ENCOUNTER — Inpatient Hospital Stay: Payer: 59

## 2021-11-25 ENCOUNTER — Inpatient Hospital Stay (HOSPITAL_BASED_OUTPATIENT_CLINIC_OR_DEPARTMENT_OTHER): Payer: 59 | Admitting: Hematology

## 2021-11-25 ENCOUNTER — Ambulatory Visit: Payer: 59

## 2021-11-25 ENCOUNTER — Ambulatory Visit: Payer: 59 | Admitting: Hematology

## 2021-11-25 DIAGNOSIS — I10 Essential (primary) hypertension: Secondary | ICD-10-CM | POA: Diagnosis not present

## 2021-11-25 DIAGNOSIS — C786 Secondary malignant neoplasm of retroperitoneum and peritoneum: Secondary | ICD-10-CM | POA: Diagnosis present

## 2021-11-25 DIAGNOSIS — C541 Malignant neoplasm of endometrium: Secondary | ICD-10-CM

## 2021-11-25 DIAGNOSIS — Z79899 Other long term (current) drug therapy: Secondary | ICD-10-CM | POA: Diagnosis not present

## 2021-11-25 DIAGNOSIS — Z95828 Presence of other vascular implants and grafts: Secondary | ICD-10-CM | POA: Diagnosis not present

## 2021-11-25 DIAGNOSIS — Z90710 Acquired absence of both cervix and uterus: Secondary | ICD-10-CM | POA: Diagnosis not present

## 2021-11-25 DIAGNOSIS — Z90722 Acquired absence of ovaries, bilateral: Secondary | ICD-10-CM | POA: Diagnosis not present

## 2021-11-25 DIAGNOSIS — Z9079 Acquired absence of other genital organ(s): Secondary | ICD-10-CM | POA: Diagnosis not present

## 2021-11-25 DIAGNOSIS — C772 Secondary and unspecified malignant neoplasm of intra-abdominal lymph nodes: Secondary | ICD-10-CM | POA: Diagnosis not present

## 2021-11-25 DIAGNOSIS — E785 Hyperlipidemia, unspecified: Secondary | ICD-10-CM | POA: Diagnosis not present

## 2021-11-25 MED ORDER — PROCHLORPERAZINE MALEATE 10 MG PO TABS: 10.0000 mg | ORAL_TABLET | Freq: Four times a day (QID) | ORAL | 3 refills | Status: DC | PRN

## 2021-11-25 NOTE — Patient Instructions (Signed)
[facility] at [facility] Discharge Instructions   You were seen and examined today by Dr. Delton Coombes.  He reviewed the results of your scan. Cancer wise the results are stable. Gallbladder stones were seen, but there was no inflammation of the gallbladder.   We can continue to monitor you and hold treatment.    Thank you for choosing [facility] at [facility] to provide your oncology and hematology care.  To afford each patient quality time with our provider, please arrive at least 15 minutes before your scheduled appointment time.   If you have a lab appointment with the [facility] please come in thru the Main Entrance and check in at the main information desk.  You need to re-schedule your appointment should you arrive 10 or more minutes late.  We strive to give you quality time with our providers, and arriving late affects you and other patients whose appointments are after yours.  Also, if you no show three or more times for appointments you may be dismissed from the clinic at the providers discretion.     Again, thank you for choosing [facility].  Our hope is that these requests will decrease the amount of time that you wait before being seen by our physicians.       _____________________________________________________________  Should you have questions after your visit to [facility], please contact our office at [phone] and follow the prompts.  Our office hours are 8:00 a.m. and 4:30 p.m. Monday - Friday.  Please note that voicemails left after 4:00 p.m. may not be returned until the following business day.  We are closed weekends and major holidays.  You do have access to a nurse 24-7, just call the main number to the clinic [phone] and do not press any options, hold on the line and a nurse will answer the phone.    For prescription refill requests, have your pharmacy contact our office and allow 72  hours.    Due to Covid, you will need to wear a mask upon entering the hospital. If you do not have a mask, a mask will be given to you at the Main Entrance upon arrival. For doctor visits, patients may have 1 support person age 53 or older with them. For treatment visits, patients can not have anyone with them due to social distancing guidelines and our immunocompromised population.

## 2021-11-25 NOTE — Progress Notes (Signed)
We will hold treatment today per Dr. Delton Coombes.  Patient will return to office in 3 months with repeat scans.

## 2021-11-25 NOTE — Progress Notes (Signed)
Jessica [name], [place] [address]   CLINIC:  Medical Oncology/Hematology  PCP:  Renee Rival, FNP [address] [place] / [place] [address] [phone]   REASON FOR VISIT:  Follow-up for recurrent endometrial cancer  PRIOR THERAPY: TAH & BSO, bilateral pelvic and para-aortic lymph nodes dissections on 04/13/2018 2.  Paclitaxel, last dose on 08/22/2021  NGS Results: not done  CURRENT THERAPY: Surveillance  BRIEF ONCOLOGIC HISTORY:  Oncology History  Endometrial cancer ([facility])  03/31/2018 Initial Diagnosis   Endometrial cancer ([facility])   12/29/2018 -  Chemotherapy   Patient is on Treatment Plan : UTERINE Carboplatin AUC 6 / Paclitaxel q21d     06/06/2020 Miscellaneous   Summary of oncologic history from Dr. Delton Coombes   1.  Recurrent endometrial carcinosarcoma: -TAH, BSO, bilateral pelvic and para-aortic lymph node resections on 04/13/2018. -Pathology showed carcinosarcoma (malignant mixed mullerian tumor) is arising in an endometrial type polyp with no myometrial invasion identified.  High-grade.  0/39 lymph nodes positive.  PT1APN0, FIGO stage Ia.  MMR normal.  MSI-stable. -CTAP on 12/09/2018 for abdominal pain showed extensive peritoneal carcinomatosis and large soft tissue mass in the pelvis. -Biopsy of the omental mass on 12/28/2018 shows poorly differentiated carcinoma consistent with her prior malignancy. -Carboplatin and paclitaxel started on 12/29/2018. -CT scan on 05/02/2019 showed peritoneal implants in the low central small bowel mesentery and left pelvic sidewall have decreased in size measuring 1.5 x 1.9 cm and 1.7 x 2.2 cm.  Soft tissue nodule in the left lateral omentum measures 1 x 1.7 cm with no evidence of metastatic disease. -Continuation of chemotherapy until complete response was recommended. -CT AP on 07/04/2019 showed continued positive response to therapy with scattered peritoneal metastasis in the pelvis and  left omentum decreased in size.  No new metastatic disease. -CTAP on 10/24/2019 after 14 cycles showed substantial reduction in size of soft tissue nodules in the pelvis.  1.4 x 0.9 x 1 cm soft tissue density in the inferior serosal margin of the sigmoid colon, previously 1.7 x 1.1 x 1.7 cm.  2 small soft tissue nodules in the mesenteric adipose tissue above the urinary bladder measures 0.8 cm. -She has developed serious allergic reaction during cycle 14 with carboplatin. -I have discussed with Dr. Denman George.  Other options include adding ifosfamide to Taxol or Doxil and combination of lenvatinib with pembrolizumab.  In the lenvatinib pembrolizumab trial carcinosarcomas were not included. -Single agent paclitaxel started on 11/09/2019, dose reduced by 20% on 01/18/2020. -CTAP on 01/10/2020 with stable small peritoneal soft tissue nodules in the pelvis.  Resolution of soft tissue nodularity in the left abdominal omental fat with no new or progressive disease.     06/20/2020 Imaging   1. Stable examination status post hysterectomy without suspicious enhancing soft tissue nodularity at the vaginal cuff and no significant change in the multiple small soft tissue nodules in the pelvis likely representing treated tumor. No new or progressive findings. 2. Cholelithiasis including a large gallstone in the neck of the gallbladder but without CT evidence of acute cholecystitis. 3. Small hiatal hernia. 4. Aortic atherosclerosis.       CANCER STAGING:  Cancer Staging  No matching staging information was found for the patient.  INTERVAL HISTORY:  [name], a 61 y.o. female, seen for follow-up of recurrent endometrial cancer.  She denies any abdominal pains.  Neuropathy has been stable.  She reported that her abdomen does not feel good and  she has been having nausea in the mornings and vomited about 4 times in the last couple of weeks.   REVIEW OF SYSTEMS:  Review of Systems  Constitutional:   Negative for appetite change.  Gastrointestinal:  Positive for nausea and vomiting.  Musculoskeletal:  Positive for arthralgias (3/10 R leg).  Neurological:  Positive for dizziness, headaches and numbness.  All other systems reviewed and are negative.   PAST MEDICAL/SURGICAL HISTORY:  Past Medical History:  Diagnosis Date   Cancer ([facility])    Phreesia 09/05/2019   Elevated blood pressure reading    Endometrial cancer ([facility])    Hyperlipidemia    Hypertension    PMB (postmenopausal bleeding)    Port-A-Cath in place 12/24/2018   Past Surgical History:  Procedure Laterality Date   ABDOMINAL HYSTERECTOMY N/A    Phreesia 09/05/2019   IR IMAGING GUIDED PORT INSERTION  12/28/2018   IR RADIOLOGIST EVAL & MGMT  06/02/2018   IR RADIOLOGIST EVAL & MGMT  06/16/2018   NECK SURGERY  2000   spinal surgery , titanium rod in place    ROBOTIC ASSISTED TOTAL HYSTERECTOMY WITH BILATERAL SALPINGO OOPHERECTOMY N/A 04/13/2018   Procedure: XI ROBOTIC [place];  Surgeon: Everitt Amber, MD;  Location: [facility];  Service: Gynecology;  Laterality: N/A;   ROBOTIC PELVIC AND PARA-AORTIC LYMPH NODE DISSECTION N/A 04/13/2018   Procedure: XI ROBOTIC PELVIC LYMPHADECTOMY AND PARA-AORTIC LYMPH NODE DISSECTION;  Surgeon: Everitt Amber, MD;  Location: [facility];  Service: Gynecology;  Laterality: N/A;    SOCIAL HISTORY:  Social History   Socioeconomic History   Marital status: Widowed    Spouse name: Not on file   Number of children: 3   Years of education: Not on file   Highest education level: Not on file  Occupational History   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  Tobacco Use   Smoking status: Never   Smokeless tobacco: Never  Vaping Use   Vaping Use: Never used  Substance and Sexual Activity   Alcohol use: Yes    Alcohol/week: 4.0 standard drinks of alcohol    Types: 4 Glasses of wine per week    Comment: weekends   Drug use: Never   Sexual activity:  Not Currently  Other Topics Concern   Not on file  Social History Narrative      Wears seat belt    Does not use phone while driving    Smoke detectors at home   Fire extinguisher-no   No weapons   Social Determinants of Health   Financial Resource Strain: Low Risk  (09/07/2019)   Overall Financial Resource Strain (CARDIA)    Difficulty of Paying Living Expenses: Not hard at all  Food Insecurity: No Food Insecurity (09/07/2019)   Hunger Vital Sign    Worried About Running Out of Food in the Last Year: Never true    Ran Out of Food in the Last Year: Never true  Transportation Needs: No Transportation Needs (09/07/2019)   PRAPARE - Hydrologist (Medical): No    Lack of Transportation (Non-Medical): No  Physical Activity: Inactive (01/18/2020)   Exercise Vital Sign    Days of Exercise per Week: 0 days    Minutes of Exercise per Session: 0 min  Stress: Stress Concern Present (09/07/2019)   [place]    Feeling of Stress : Rather much  Social Connections: Socially Isolated (  09/07/2019)   Social Connection and Isolation Panel [NHANES]    Frequency of Communication with Friends and Family: More than three times a week    Frequency of Social Gatherings with Friends and Family: More than three times a week    Attends Religious Services: Never    Marine scientist or Organizations: No    Attends Archivist Meetings: Never    Marital Status: Widowed  Intimate Partner Violence: Not At Risk (09/07/2019)   Humiliation, Afraid, Rape, and Kick questionnaire    Fear of Current or Ex-Partner: No    Emotionally Abused: No    Physically Abused: No    Sexually Abused: No    FAMILY HISTORY:  Family History  Problem Relation Age of Onset   Hypertension Mother    Breast cancer Mother    Hypertension Father    Heart disease Father    Cancer Father        lung   Cancer Sister         ovarian cancer   Melanoma Sister     CURRENT MEDICATIONS:  Current Outpatient Medications  Medication Sig Dispense Refill   amLODipine (NORVASC) 5 MG tablet Take 1 tablet (5 mg total) by mouth daily. 90 tablet 1   atorvastatin (LIPITOR) 20 MG tablet Take 1 tablet (20 mg total) by mouth daily. 90 tablet 1   Cholecalciferol (VITAMIN D3) 25 MCG (1000 UT) CAPS Take 1 capsule (1,000 Units total) by mouth daily. 60 capsule 2   cyclobenzaprine (FLEXERIL) 5 MG tablet Take 1 tablet (5 mg total) by mouth 3 (three) times daily as needed for muscle spasms. 30 tablet 1   EPINEPHrine 0.3 mg/0.3 mL IJ SOAJ injection Inject 0.3 mg into the muscle as needed for anaphylaxis. 1 each 0   lidocaine-prilocaine (EMLA) cream Apply to port site prior to use (Patient taking differently: 1 application  daily as needed (apply to port site prior to chemo).) 30 g 0   MAGNESIUM-OXIDE 400 (240 Mg) MG tablet Take 1 tablet by mouth twice daily 60 tablet 0   oxyCODONE-acetaminophen (PERCOCET) 5-325 MG tablet Take 1 tablet by mouth every 12 (twelve) hours as needed (pain). 60 tablet 0   PACLITAXEL IV Inject into the vein every 21 ( twenty-one) days.     pregabalin (LYRICA) 75 MG capsule Take 1 capsule (75 mg total) by mouth 3 (three) times daily. 90 capsule 3   prochlorperazine (COMPAZINE) 10 MG tablet Take 1 tablet (10 mg total) by mouth every 6 (six) hours as needed (Nausea or vomiting). 60 tablet 3   No current facility-administered medications for this visit.   Facility-Administered Medications Ordered in Other Visits  Medication Dose Route Frequency Provider Last Rate Last Admin   diphenhydrAMINE (BENADRYL) 50 MG/ML injection            palonosetron (ALOXI) 0.25 MG/5ML injection             ALLERGIES:  Allergies  Allergen Reactions   Carboplatin Other (See Comments)    "body on fire and abdominal pain"   Nickel Rash    itchy    PHYSICAL EXAM:  Performance status (ECOG): 1 - Symptomatic but completely  ambulatory  There were no vitals filed for this visit. Wt Readings from Last 3 Encounters:  11/20/21 230 lb (104.3 kg)  08/22/21 229 lb 8 oz (104.1 kg)  07/25/21 230 lb 6.1 oz (104.5 kg)   Physical Exam Vitals reviewed.  Constitutional:      Appearance:  Normal appearance. She is obese.  Cardiovascular:     Rate and Rhythm: Normal rate and regular rhythm.     Pulses: Normal pulses.     Heart sounds: Normal heart sounds.  Pulmonary:     Effort: Pulmonary effort is normal.     Breath sounds: Normal breath sounds.  Neurological:     General: No focal deficit present.     Mental Status: She is alert and oriented to person, place, and time.  Psychiatric:        Mood and Affect: Mood normal.        Behavior: Behavior normal.     LABORATORY DATA:  I have reviewed the labs as listed.     Latest Ref Rng & Units 11/20/2021   10:09 AM 08/22/2021    8:11 AM 07/25/2021    7:51 AM  CBC  WBC 4.0 - 10.5 K/uL 7.9  7.6  8.3   Hemoglobin 12.0 - 15.0 g/dL 11.9  12.0  11.9   Hematocrit 36.0 - 46.0 % 36.1  36.9  36.7   Platelets 150 - 400 K/uL 315  294  323       Latest Ref Rng & Units 11/20/2021   10:09 AM 08/22/2021    8:11 AM 07/25/2021    7:51 AM  CMP  Glucose 70 - 99 mg/dL 115  109  131   BUN 6 - 20 mg/dL _0 Creatinine 0.44 - 1.00 mg/dL 0.55  0.70  0.69   Sodium 135 - 145 mmol/L 137  140  140   Potassium 3.5 - 5.1 mmol/L 3.5  3.5  4.0   Chloride 98 - 111 mmol/L 102  107  108   CO2 22 - 32 mmol/L _1 Calcium 8.9 - 10.3 mg/dL 8.9  8.8  9.2   Total Protein 6.5 - 8.1 g/dL 7.2  7.0  7.2   Total Bilirubin 0.3 - 1.2 mg/dL 0.6  0.5  0.5   Alkaline Phos 38 - 126 U/L 118  100  107   AST 15 - 41 U/L _2 ALT 0 - 44 U/L 40  20  19     DIAGNOSTIC IMAGING:  I have independently reviewed the scans and discussed with the patient. CT Abdomen Pelvis W Contrast  Result Date: 11/21/2021 CLINICAL DATA:  Follow-up recurrent endometrial carcinoma. Undergoing chemotherapy.  Assess treatment response. * Tracking Code: BO * EXAM: CT ABDOMEN AND PELVIS WITH CONTRAST TECHNIQUE: Multidetector CT imaging of the abdomen and pelvis was performed using the standard protocol following bolus administration of intravenous contrast. RADIATION DOSE REDUCTION: This exam was performed according to the departmental dose-optimization program which includes automated exposure control, adjustment of the mA and/or kV according to patient size and/or use of iterative reconstruction technique. CONTRAST:  123m OMNIPAQUE IOHEXOL 300 MG/ML  SOLN COMPARISON:  08/15/2021 FINDINGS: Lower Chest: No acute findings. Hepatobiliary: No hepatic masses identified. Gallstones are again noted, however there is no evidence of cholecystitis or biliary ductal dilatation. Pancreas:  No mass or inflammatory changes. Spleen: Within normal limits in size and appearance. Adrenals/Urinary Tract: No suspicious masses identified. No evidence of ureteral calculi or hydronephrosis. Stomach/Bowel: Small hiatal hernia is again seen. No evidence of obstruction, inflammatory process or abnormal fluid collections. Vascular/Lymphatic: No pathologically enlarged lymph nodes. No acute vascular findings. Aortic atherosclerotic calcification incidentally noted. Reproductive: Prior hysterectomy noted. Adnexal regions are unremarkable in appearance. No evidence  of mass or ascites. Other: No evidence of peritoneal soft tissue thickening or nodularity. Musculoskeletal:  No suspicious bone lesions identified. IMPRESSION: Stable exam. No evidence of recurrent or metastatic carcinoma within the abdomen or pelvis. Cholelithiasis. No radiographic evidence of cholecystitis. Stable small hiatal hernia. Aortic Atherosclerosis (ICD10-I70.0). Electronically Signed   By: Marlaine Hind M.D.   On: 11/21/2021 16:20   DG Pelvis 1-2 Views  Result Date: 11/20/2021 CLINICAL DATA:  [place] last night, RIGHT lower back pain, difficulty ambulating, muscle spasms EXAM:  PELVIS - 1-2 VIEW COMPARISON:  None Available. FINDINGS: Osseous demineralization. SI joints and LEFT hip joint space preserved. Marked narrowing of superior aspect of RIGHT hip joint with bone-on-bone appearance and minimal spur formation. No fracture, dislocation, or bone destruction. Degenerative disc and facet disease changes at visualized lower lumbar spine. IMPRESSION: Degenerative changes of RIGHT hip joint and lower lumbar spine. No acute abnormalities. Electronically Signed   By: Lavonia Dana M.D.   On: 11/20/2021 08:17   DG Lumbar Spine Complete  Result Date: 11/20/2021 CLINICAL DATA:  [place] last night, RIGHT low back pain, difficulty ambulating, muscle spasms EXAM: LUMBAR SPINE - COMPLETE 4+ VIEW COMPARISON:  None available FINDINGS: Five non-rib-bearing lumbar vertebra. Bones appear demineralized. Facet degenerative changes lower lumbar spine. Multilevel disc space narrowing. Vertebral body heights maintained without fracture, subluxation, bone destruction, or spondylolysis. SI joints preserved. IMPRESSION: Degenerative disc and facet disease changes of lower lumbar spine. No acute abnormalities. Electronically Signed   By: Lavonia Dana M.D.   On: 11/20/2021 08:16   DG Ribs Unilateral W/Chest Right  Result Date: 11/20/2021 CLINICAL DATA:  Pain post fall EXAM: RIGHT RIBS AND CHEST - 3+ VIEW COMPARISON:  02/07/2021 FINDINGS: Stable right IJ port catheter to the cavoatrial junction. Cervical fixation hardware noted. Lungs clear. Heart size normal. No pneumothorax or effusion. No displaced rib fracture. Anterior vertebral endplate spurring at multiple levels in the lower thoracic spine. IMPRESSION: No displaced rib fracture or other acute finding. Electronically Signed   By: Lucrezia Europe M.D.   On: 11/20/2021 08:15   DG Knee Complete 4 Views Right  Result Date: 11/20/2021 CLINICAL DATA:  Fall last night.  Difficulty ambulating. EXAM: RIGHT KNEE - COMPLETE 4+ VIEW COMPARISON:  None Available. FINDINGS:  There is no acute fracture or dislocation. Knee alignment is normal. There is mild medial and lateral compartment joint space narrowing with minimal osteophytosis. The soft tissues are unremarkable. There is no effusion. IMPRESSION: No acute fracture or dislocation. Electronically Signed   By: Valetta Mole M.D.   On: 11/20/2021 08:15     ASSESSMENT:  1.  Recurrent endometrial carcinosarcoma: -TAH, BSO, bilateral pelvic and para-aortic lymph node resections on 04/13/2018. -Pathology showed carcinosarcoma (malignant mixed mullerian tumor) is arising in an endometrial type polyp with no myometrial invasion identified.  High-grade.  0/39 lymph nodes positive.  PT1APN0, FIGO stage Ia.  MMR normal.  MSI-stable. -CTAP on 12/09/2018 for abdominal pain showed extensive peritoneal carcinomatosis and large soft tissue mass in the pelvis. -Biopsy of the omental mass on 12/28/2018 shows poorly differentiated carcinoma consistent with her prior malignancy. -Carboplatin and paclitaxel started on 12/29/2018. -CT scan on 05/02/2019 showed peritoneal implants in the low central small bowel mesentery and left pelvic sidewall have decreased in size measuring 1.5 x 1.9 cm and 1.7 x 2.2 cm.  Soft tissue nodule in the left lateral omentum measures 1 x 1.7 cm with no evidence of metastatic disease. -Continuation of chemotherapy until complete response was recommended. -CT  AP on 07/04/2019 showed continued positive response to therapy with scattered peritoneal metastasis in the pelvis and left omentum decreased in size.  No new metastatic disease. -CTAP on 10/24/2019 after 14 cycles showed substantial reduction in size of soft tissue nodules in the pelvis.  1.4 x 0.9 x 1 cm soft tissue density in the inferior serosal margin of the sigmoid colon, previously 1.7 x 1.1 x 1.7 cm.  2 small soft tissue nodules in the mesenteric adipose tissue above the urinary bladder measures 0.8 cm. -She has developed serious allergic reaction during  cycle 14 with carboplatin. -I have discussed with Dr. Denman George.  Other options include adding ifosfamide to Taxol or Doxil and combination of lenvatinib with pembrolizumab.  In the lenvatinib pembrolizumab trial carcinosarcomas were not included. -Single agent paclitaxel started on 11/09/2019, dose reduced by 20% on 01/18/2020. -CTAP on 01/10/2020 with stable small peritoneal soft tissue nodules in the pelvis.  Resolution of soft tissue nodularity in the left abdominal omental fat with no new or progressive disease.   2.  Breast abnormality: -Mammogram on 05/02/2019 shows BI-RADS Category 0.  She reportedly took Covid shot 1 to 2 weeks prior to the mammogram.  Further scans rescheduled on 06/21/2019.   3.  Peripheral neuropathy: -She reported numbness and occasional pins-and-needles sensation in the bilateral toes, right more than left.  This has started about a week ago.   PLAN:  1.  Recurrent endometrial carcinosarcoma: - Her last paclitaxel was in August. - Reviewed labs today which showed normal LFTs.  CBC was grossly normal. - CTAP (11/20/2021): Stable exam with no evidence of recurrence or metastatic disease. - She has been complaining of feeling uneasiness in the stomach but not exactly pain.  She had nausea in the mornings and vomited 4 times in the last 2 weeks.  CT scan showed gallbladder stones with no evidence of inflammation.  If there is no improvement in the next few days, she is seeing her PMD.  Ultrasound of the gallbladder would be reasonable next best step. - I have recommended against restarting chemotherapy at this time as there is no evidence of disease.  We will plan to see her back in 3 months with repeat CT scan and labs.   2.  Low back pain/right-sided abdominal pain: - She has on and off bilateral hip pains.  Continue oxycodone as needed.   3.  Hypertension: - Continue Norvasc 5 mg daily.  Blood pressure is 139/83.   4.  Neuropathy: -Neuropathy in the legs is stable. -  Continue Lyrica 75 mg 3 times daily. - Continue oxycodone 5 mg as needed.   5.  Abnormal mammogram: - Mammogram on 06/19/2021 was BI-RADS Category 1.  6.  Hypomagnesemia: - Continue magnesium 1 tablet daily.  Magnesium is 1.8.   Orders placed this encounter:  No orders of the defined types were placed in this encounter.    [name], MD [place] [phone]

## 2021-11-26 ENCOUNTER — Other Ambulatory Visit: Payer: Self-pay

## 2021-11-28 ENCOUNTER — Encounter: Payer: Self-pay | Admitting: *Deleted

## 2021-12-18 ENCOUNTER — Other Ambulatory Visit (HOSPITAL_COMMUNITY): Payer: Self-pay | Admitting: Hematology

## 2021-12-19 ENCOUNTER — Other Ambulatory Visit: Payer: Self-pay | Admitting: *Deleted

## 2021-12-19 ENCOUNTER — Ambulatory Visit (INDEPENDENT_AMBULATORY_CARE_PROVIDER_SITE_OTHER): Payer: 59 | Admitting: Internal Medicine

## 2021-12-19 ENCOUNTER — Encounter: Payer: Self-pay | Admitting: Internal Medicine

## 2021-12-19 VITALS — BP 130/82 | HR 100 | Ht 64.0 in | Wt 223.0 lb

## 2021-12-19 DIAGNOSIS — I10 Essential (primary) hypertension: Secondary | ICD-10-CM | POA: Diagnosis not present

## 2021-12-19 DIAGNOSIS — R300 Dysuria: Secondary | ICD-10-CM | POA: Diagnosis not present

## 2021-12-19 DIAGNOSIS — K8020 Calculus of gallbladder without cholecystitis without obstruction: Secondary | ICD-10-CM | POA: Diagnosis not present

## 2021-12-19 DIAGNOSIS — E785 Hyperlipidemia, unspecified: Secondary | ICD-10-CM

## 2021-12-19 DIAGNOSIS — F321 Major depressive disorder, single episode, moderate: Secondary | ICD-10-CM

## 2021-12-19 DIAGNOSIS — Z90710 Acquired absence of both cervix and uterus: Secondary | ICD-10-CM | POA: Diagnosis not present

## 2021-12-19 DIAGNOSIS — Z0001 Encounter for general adult medical examination with abnormal findings: Secondary | ICD-10-CM

## 2021-12-19 DIAGNOSIS — C541 Malignant neoplasm of endometrium: Secondary | ICD-10-CM

## 2021-12-19 DIAGNOSIS — F419 Anxiety disorder, unspecified: Secondary | ICD-10-CM

## 2021-12-19 MED ORDER — OXYCODONE-ACETAMINOPHEN 5-325 MG PO TABS: 1.0000 | ORAL_TABLET | Freq: Two times a day (BID) | ORAL | 0 refills | Status: DC | PRN

## 2021-12-19 MED ORDER — AMLODIPINE BESYLATE 5 MG PO TABS: 5.0000 mg | ORAL_TABLET | Freq: Every day | ORAL | 1 refills | Status: DC

## 2021-12-19 MED ORDER — ATORVASTATIN CALCIUM 20 MG PO TABS: 20.0000 mg | ORAL_TABLET | Freq: Every day | ORAL | 1 refills | Status: DC

## 2021-12-19 MED ORDER — SERTRALINE HCL 100 MG PO TABS: 100.0000 mg | ORAL_TABLET | Freq: Every day | ORAL | 1 refills | Status: DC

## 2021-12-19 NOTE — Progress Notes (Signed)
Established Patient Office Visit  Subjective   Patient ID: Jessica [name], female    DOB: 05-27-1960  Age: 61 y.o. MRN: [id number]  Chief Complaint  Patient presents with   Follow-up    6 months   GI Problem   Jessica [name] returns to care today.  She was last seen at [facility] in May for her annual exam.  Daily vitamin D supplementation was recommended and no other medication changes were made.  In the interim, he has been seen by oncology for follow-up due to her history of endometrial cancer for which she is currently under surveillance.  Today Jessica [name] endorses recent nausea with vomiting.  This seems to occur in the morning and has caused her to feel weak.  She has not identified any pattern or association with the onset of nausea.  She has previously been told she has gallstones and wonders if this is contributing to her symptoms.  Acute concerns, chronic medical conditions, and outstanding preventative care items discussed today are individually addressed in A/P below.  Past Medical History:  Diagnosis Date   Cancer (Center)    Phreesia 09/05/2019   Elevated blood pressure reading    Endometrial cancer (HCC)    Hyperlipidemia    Hypertension    PMB (postmenopausal bleeding)    Port-A-Cath in place 12/24/2018   Past Surgical History:  Procedure Laterality Date   ABDOMINAL HYSTERECTOMY N/A    Phreesia 09/05/2019   IR IMAGING GUIDED PORT INSERTION  12/28/2018   IR RADIOLOGIST EVAL & MGMT  06/02/2018   IR RADIOLOGIST EVAL & MGMT  06/16/2018   NECK SURGERY  2000   spinal surgery , titanium rod in place    ROBOTIC ASSISTED TOTAL HYSTERECTOMY WITH BILATERAL SALPINGO OOPHERECTOMY N/A 04/13/2018   Procedure: XI ROBOTIC [place];  Surgeon: Everitt Amber, MD;  Location: [facility];  Service: Gynecology;  Laterality: N/A;   ROBOTIC PELVIC AND PARA-AORTIC LYMPH NODE DISSECTION N/A 04/13/2018   Procedure: XI ROBOTIC PELVIC LYMPHADECTOMY AND  PARA-AORTIC LYMPH NODE DISSECTION;  Surgeon: Everitt Amber, MD;  Location: [facility];  Service: Gynecology;  Laterality: N/A;   Social History   Tobacco Use   Smoking status: Never   Smokeless tobacco: Never  Vaping Use   Vaping Use: Never used  Substance Use Topics   Alcohol use: Yes    Alcohol/week: 4.0 standard drinks of alcohol    Types: 4 Glasses of wine per week    Comment: weekends   Drug use: Never   Family History  Problem Relation Age of Onset   Hypertension Mother    Breast cancer Mother    Hypertension Father    Heart disease Father    Cancer Father        lung   Cancer Sister        ovarian cancer   Melanoma Sister    Allergies  Allergen Reactions   Carboplatin Other (See Comments)    "body on fire and abdominal pain"   Nickel Rash    itchy   Review of Systems  Gastrointestinal:  Positive for nausea and vomiting.  All other systems reviewed and are negative.    Objective:     BP 130/82   Pulse 100   Ht '5\' 4"'$  (1.626 m)   Wt 223 lb (101.2 kg)   SpO2 93%   BMI 38.28 kg/m  BP Readings from Last 3 Encounters:  12/19/21 130/82  11/25/21 139/83  11/20/21 Marland Kitchen)  147/83   Physical Exam Vitals reviewed.  Constitutional:      General: She is not in acute distress.    Appearance: Normal appearance. She is obese. She is not toxic-appearing.  HENT:     Head: Normocephalic and atraumatic.     Right Ear: External ear normal.     Left Ear: External ear normal.     Nose: Nose normal. No congestion or rhinorrhea.     Mouth/Throat:     Mouth: Mucous membranes are moist.     Pharynx: Oropharynx is clear. No oropharyngeal exudate or posterior oropharyngeal erythema.  Eyes:     General: No scleral icterus.    Extraocular Movements: Extraocular movements intact.     Conjunctiva/sclera: Conjunctivae normal.     Pupils: Pupils are equal, round, and reactive to light.  Cardiovascular:     Rate and Rhythm: Normal rate and regular rhythm.     Pulses: Normal pulses.      Heart sounds: Normal heart sounds. No murmur heard.    No friction rub. No gallop.  Pulmonary:     Effort: Pulmonary effort is normal.     Breath sounds: Normal breath sounds. No wheezing, rhonchi or rales.  Abdominal:     General: Abdomen is flat. Bowel sounds are normal. There is no distension.     Palpations: Abdomen is soft. There is no mass.     Tenderness: There is no abdominal tenderness. There is no guarding.     Hernia: No hernia is present.  Musculoskeletal:        General: No swelling. Normal range of motion.     Cervical back: Normal range of motion.     Right lower leg: No edema.     Left lower leg: No edema.  Lymphadenopathy:     Cervical: No cervical adenopathy.  Skin:    General: Skin is warm and dry.     Capillary Refill: Capillary refill takes less than 2 seconds.     Coloration: Skin is not jaundiced.  Neurological:     General: No focal deficit present.     Mental Status: She is alert and oriented to person, place, and time.  Psychiatric:        Mood and Affect: Mood normal.        Behavior: Behavior normal.    Last CBC Lab Results  Component Value Date   WBC 7.9 11/20/2021   HGB 11.9 (L) 11/20/2021   HCT 36.1 11/20/2021   MCV 81.5 11/20/2021   MCH 26.9 11/20/2021   RDW 15.9 (H) 11/20/2021   PLT 315 41/96/2229   Last metabolic panel Lab Results  Component Value Date   GLUCOSE 115 (H) 11/20/2021   NA 137 11/20/2021   K 3.5 11/20/2021   CL 102 11/20/2021   CO2 24 11/20/2021   BUN 15 11/20/2021   CREATININE 0.55 11/20/2021   GFRNONAA >60 11/20/2021   CALCIUM 8.9 11/20/2021   PROT 7.2 11/20/2021   ALBUMIN 3.8 11/20/2021   LABGLOB 2.2 04/26/2018   AGRATIO 1.9 04/26/2018   BILITOT 0.6 11/20/2021   ALKPHOS 118 11/20/2021   AST 31 11/20/2021   ALT 40 11/20/2021   ANIONGAP 11 11/20/2021   Last lipids Lab Results  Component Value Date   CHOL 197 06/20/2021   HDL 81 06/20/2021   LDLCALC 104 (H) 06/20/2021   TRIG 63 06/20/2021    CHOLHDL 2.4 06/20/2021   Last hemoglobin A1c Lab Results  Component Value Date   HGBA1C 6.3 (  H) 06/12/2021   Last thyroid functions Lab Results  Component Value Date   TSH 3.140 06/12/2021   T4TOTAL 7.9 04/26/2018   Last vitamin D Lab Results  Component Value Date   VD25OH 20.7 (L) 06/12/2021   The 10-year ASCVD risk score (Arnett DK, et al., 2019) is: 3.1%    Assessment & Plan:   Problem List Items Addressed This Visit       Essential hypertension    Currently prescribed Norvasc 5 mg daily for treatment of hypertension.  Her blood pressure today is 130/82.  No medication changes.  Norvasc has been refilled.      Cholelithiasis    Noted on previous CT scans.  She has recently experienced persistent nausea with vomiting and is concerned that her gallbladder may be contributing to her symptoms.  She is interested in further work-up for this issue. -I have ordered a right upper quadrant ultrasound today.  Can consider referral to general surgery to discuss cholecystectomy pending results.      Hyperlipidemia    Lipid panel updated in June.  She is currently prescribed atorvastatin 20 mg daily.  No medication changes today.  Atorvastatin has been refilled.      Depression, major, single episode, moderate (HCC)    Mood stable on sertraline, which has been refilled today.      Return in about 6 months (around 06/19/2022).    Johnette Abraham, MD

## 2021-12-19 NOTE — Assessment & Plan Note (Signed)
Currently prescribed Norvasc 5 mg daily for treatment of hypertension.  Her blood pressure today is 130/82.  No medication changes.  Norvasc has been refilled.

## 2021-12-19 NOTE — Patient Instructions (Signed)
It was a pleasure to see you today.  Thank you for giving [place] the opportunity to be involved in your care.  Below is a brief recap of your visit and next steps.  We will plan to see you again in 6 months.  Summary I have refilled your medications today We will check an ultrasound of your gallbladder to assess for distention and to see if this could be contributing to your nausea. Follow up in 6 months. I will order labs to be completed prior to your appointment.

## 2021-12-20 ENCOUNTER — Other Ambulatory Visit: Payer: Self-pay

## 2021-12-20 DIAGNOSIS — K8020 Calculus of gallbladder without cholecystitis without obstruction: Secondary | ICD-10-CM | POA: Insufficient documentation

## 2021-12-20 NOTE — Assessment & Plan Note (Signed)
Mood stable on sertraline, which has been refilled today.

## 2021-12-20 NOTE — Assessment & Plan Note (Signed)
Noted on previous CT scans.  She has recently experienced persistent nausea with vomiting and is concerned that her gallbladder may be contributing to her symptoms.  She is interested in further work-up for this issue. -I have ordered a right upper quadrant ultrasound today.  Can consider referral to general surgery to discuss cholecystectomy pending results.

## 2021-12-20 NOTE — Assessment & Plan Note (Addendum)
Lipid panel updated in June.  She is currently prescribed atorvastatin 20 mg daily.  No medication changes today.  Atorvastatin has been refilled.

## 2021-12-26 ENCOUNTER — Ambulatory Visit (HOSPITAL_COMMUNITY)
Admission: RE | Admit: 2021-12-26 | Discharge: 2021-12-26 | Disposition: A | Discharge status: Home / Self Care | Payer: 59 | Source: Ambulatory Visit | Attending: Internal Medicine | Admitting: Internal Medicine

## 2021-12-26 DIAGNOSIS — K8020 Calculus of gallbladder without cholecystitis without obstruction: Secondary | ICD-10-CM | POA: Diagnosis present

## 2022-01-19 ENCOUNTER — Other Ambulatory Visit (HOSPITAL_COMMUNITY): Payer: Self-pay | Admitting: Hematology

## 2022-01-21 ENCOUNTER — Encounter: Payer: Self-pay | Admitting: Hematology

## 2022-02-10 ENCOUNTER — Other Ambulatory Visit (HOSPITAL_COMMUNITY): Payer: Self-pay | Admitting: *Deleted

## 2022-02-10 DIAGNOSIS — C541 Malignant neoplasm of endometrium: Secondary | ICD-10-CM

## 2022-02-11 MED ORDER — OXYCODONE-ACETAMINOPHEN 5-325 MG PO TABS: 1.0000 | ORAL_TABLET | Freq: Two times a day (BID) | ORAL | 0 refills | Status: DC | PRN

## 2022-02-17 ENCOUNTER — Other Ambulatory Visit: Payer: Self-pay | Admitting: Hematology

## 2022-02-25 ENCOUNTER — Other Ambulatory Visit (HOSPITAL_COMMUNITY): Payer: 59

## 2022-02-25 ENCOUNTER — Inpatient Hospital Stay: Payer: 59 | Attending: Hematology

## 2022-02-25 ENCOUNTER — Ambulatory Visit (HOSPITAL_COMMUNITY)
Admission: RE | Admit: 2022-02-25 | Discharge: 2022-02-25 | Disposition: A | Discharge status: Home / Self Care | Payer: 59 | Source: Ambulatory Visit | Attending: Hematology | Admitting: Hematology

## 2022-02-25 DIAGNOSIS — I10 Essential (primary) hypertension: Secondary | ICD-10-CM | POA: Insufficient documentation

## 2022-02-25 DIAGNOSIS — Z90710 Acquired absence of both cervix and uterus: Secondary | ICD-10-CM | POA: Diagnosis not present

## 2022-02-25 DIAGNOSIS — M25551 Pain in right hip: Secondary | ICD-10-CM | POA: Diagnosis not present

## 2022-02-25 DIAGNOSIS — M25552 Pain in left hip: Secondary | ICD-10-CM | POA: Diagnosis not present

## 2022-02-25 DIAGNOSIS — C541 Malignant neoplasm of endometrium: Secondary | ICD-10-CM | POA: Insufficient documentation

## 2022-02-25 DIAGNOSIS — R109 Unspecified abdominal pain: Secondary | ICD-10-CM | POA: Diagnosis not present

## 2022-02-25 DIAGNOSIS — R918 Other nonspecific abnormal finding of lung field: Secondary | ICD-10-CM | POA: Insufficient documentation

## 2022-02-25 DIAGNOSIS — Z9221 Personal history of antineoplastic chemotherapy: Secondary | ICD-10-CM | POA: Insufficient documentation

## 2022-02-25 DIAGNOSIS — M545 Low back pain, unspecified: Secondary | ICD-10-CM | POA: Diagnosis not present

## 2022-02-25 DIAGNOSIS — C786 Secondary malignant neoplasm of retroperitoneum and peritoneum: Secondary | ICD-10-CM | POA: Diagnosis present

## 2022-02-25 DIAGNOSIS — G629 Polyneuropathy, unspecified: Secondary | ICD-10-CM | POA: Insufficient documentation

## 2022-02-25 DIAGNOSIS — Z79899 Other long term (current) drug therapy: Secondary | ICD-10-CM | POA: Insufficient documentation

## 2022-02-25 DIAGNOSIS — R0789 Other chest pain: Secondary | ICD-10-CM | POA: Insufficient documentation

## 2022-02-25 LAB — CBC WITH DIFFERENTIAL/PLATELET
Abs Immature Granulocytes: 0.03 10*3/uL (ref 0.00–0.07)
Basophils Absolute: 0.1 10*3/uL (ref 0.0–0.1)
Basophils Relative: 1 %
Eosinophils Absolute: 0.2 10*3/uL (ref 0.0–0.5)
Eosinophils Relative: 2 %
HCT: 40.4 % (ref 36.0–46.0)
Hemoglobin: 13 g/dL (ref 12.0–15.0)
Immature Granulocytes: 0 %
Lymphocytes Relative: 38 %
Lymphs Abs: 3 10*3/uL (ref 0.7–4.0)
MCH: 25.7 pg — ABNORMAL LOW (ref 26.0–34.0)
MCHC: 32.2 g/dL (ref 30.0–36.0)
MCV: 80 fL (ref 80.0–100.0)
Monocytes Absolute: 0.7 10*3/uL (ref 0.1–1.0)
Monocytes Relative: 8 %
Neutro Abs: 4.1 10*3/uL (ref 1.7–7.7)
Neutrophils Relative %: 51 %
Platelets: 310 10*3/uL (ref 150–400)
RBC: 5.05 MIL/uL (ref 3.87–5.11)
RDW: 15.4 % (ref 11.5–15.5)
WBC: 8.1 10*3/uL (ref 4.0–10.5)
nRBC: 0 % (ref 0.0–0.2)

## 2022-02-25 LAB — COMPREHENSIVE METABOLIC PANEL
ALT: 26 U/L (ref 0–44)
AST: 21 U/L (ref 15–41)
Albumin: 3.7 g/dL (ref 3.5–5.0)
Alkaline Phosphatase: 116 U/L (ref 38–126)
Anion gap: 9 (ref 5–15)
BUN: 21 mg/dL — ABNORMAL HIGH (ref 6–20)
CO2: 24 mmol/L (ref 22–32)
Calcium: 9.4 mg/dL (ref 8.9–10.3)
Chloride: 105 mmol/L (ref 98–111)
Creatinine, Ser: 0.6 mg/dL (ref 0.44–1.00)
GFR, Estimated: >60 mL/min (ref >60)
Glucose, Bld: 118 mg/dL — ABNORMAL HIGH (ref 70–99)
Potassium: 3.6 mmol/L (ref 3.5–5.1)
Sodium: 138 mmol/L (ref 135–145)
Total Bilirubin: 0.2 mg/dL — ABNORMAL LOW (ref 0.3–1.2)
Total Protein: 7.2 g/dL (ref 6.5–8.1)

## 2022-02-25 LAB — MAGNESIUM: Magnesium: 1.7 mg/dL (ref 1.7–2.4)

## 2022-02-25 MED ORDER — HEPARIN SOD (PORK) LOCK FLUSH 100 UNIT/ML IV SOLN
INTRAVENOUS | Status: AC
  Administered 2022-02-25: 500 [IU] via INTRAVENOUS
  Filled 2022-02-25: qty 5

## 2022-02-25 MED ORDER — HEPARIN SOD (PORK) LOCK FLUSH 100 UNIT/ML IV SOLN: 500.0000 [IU] | Freq: Once | INTRAVENOUS | Status: AC

## 2022-02-25 MED ORDER — SODIUM CHLORIDE 0.9% FLUSH
10.0000 mL | Freq: Once | INTRAVENOUS | Status: AC
  Administered 2022-02-25: 10 mL via INTRAVENOUS

## 2022-02-25 MED ORDER — IOHEXOL 300 MG/ML  SOLN
100.0000 mL | Freq: Once | INTRAMUSCULAR | Status: AC | PRN
  Administered 2022-02-25: 100 mL via INTRAVENOUS

## 2022-02-25 NOTE — Patient Instructions (Signed)
[facility]  Discharge Instructions: Thank you for choosing [facility] to provide your oncology and hematology care.  If you have a lab appointment with the [facility], please come in thru the Main Entrance and check in at the main information desk.  Wear comfortable clothing and clothing appropriate for easy access to any Portacath or PICC line.   We strive to give you quality time with your provider. You may need to reschedule your appointment if you arrive late (15 or more minutes).  Arriving late affects you and other patients whose appointments are after yours.  Also, if you miss three or more appointments without notifying the office, you may be dismissed from the clinic at the provider's discretion.      For prescription refill requests, have your pharmacy contact our office and allow 72 hours for refills to be completed.    Today you received the following port flushed with labs for CT scan.   To help prevent nausea and vomiting after your treatment, we encourage you to take your nausea medication as directed.  BELOW ARE SYMPTOMS THAT SHOULD BE REPORTED IMMEDIATELY: *FEVER GREATER THAN 100.4 F (38 C) OR HIGHER *CHILLS OR SWEATING *NAUSEA AND VOMITING THAT IS NOT CONTROLLED WITH YOUR NAUSEA MEDICATION *UNUSUAL SHORTNESS OF BREATH *UNUSUAL BRUISING OR BLEEDING *URINARY PROBLEMS (pain or burning when urinating, or frequent urination) *BOWEL PROBLEMS (unusual diarrhea, constipation, pain near the anus) TENDERNESS IN MOUTH AND THROAT WITH OR WITHOUT PRESENCE OF ULCERS (sore throat, sores in mouth, or a toothache) UNUSUAL RASH, SWELLING OR PAIN  UNUSUAL VAGINAL DISCHARGE OR ITCHING   Items with * indicate a potential emergency and should be followed up as soon as possible or go to the Emergency Department if any problems should occur.  Please show the CHEMOTHERAPY ALERT CARD or IMMUNOTHERAPY ALERT CARD at check-in to the Emergency Department and  triage nurse.  Should you have questions after your visit or need to cancel or reschedule your appointment, please contact [facility] [phone]  and follow the prompts.  Office hours are 8:00 a.m. to 4:30 p.m. Monday - Friday. Please note that voicemails left after 4:00 p.m. may not be returned until the following business day.  We are closed weekends and major holidays. You have access to a nurse at all times for urgent questions. Please call the main number to the clinic [phone] and follow the prompts.  For any non-urgent questions, you may also contact your provider using MyChart. We now offer e-Visits for anyone 44 and older to request care online for non-urgent symptoms. For details visit [email or web address].   Also download the MyChart app! Go to the app store, search "MyChart", open the app, select [facility], and log in with your MyChart username and password.

## 2022-02-25 NOTE — Progress Notes (Signed)
Port flushed with good blood return noted. No bruising or swelling at site. Patient left accessed for CT scan and patient discharged in satisfactory condition. VVS stable with no signs or symptoms of distressed noted. 

## 2022-02-27 ENCOUNTER — Ambulatory Visit (INDEPENDENT_AMBULATORY_CARE_PROVIDER_SITE_OTHER): Payer: 59 | Admitting: Internal Medicine

## 2022-02-27 ENCOUNTER — Encounter: Payer: Self-pay | Admitting: Internal Medicine

## 2022-02-27 ENCOUNTER — Other Ambulatory Visit: Payer: Self-pay | Admitting: Internal Medicine

## 2022-02-27 VITALS — BP 143/79 | HR 88 | Ht 64.0 in | Wt 218.0 lb

## 2022-02-27 DIAGNOSIS — Z0000 Encounter for general adult medical examination without abnormal findings: Secondary | ICD-10-CM | POA: Diagnosis not present

## 2022-02-27 DIAGNOSIS — I10 Essential (primary) hypertension: Secondary | ICD-10-CM

## 2022-02-27 DIAGNOSIS — R0789 Other chest pain: Secondary | ICD-10-CM

## 2022-02-27 DIAGNOSIS — E785 Hyperlipidemia, unspecified: Secondary | ICD-10-CM

## 2022-02-27 LAB — CA 125: Cancer Antigen (CA) 125: 6.4 U/mL (ref 0.0–38.1)

## 2022-02-27 NOTE — Progress Notes (Signed)
Subjective:    Jessica [name] is a 62 y.o. female who presents for a Welcome to Medicare exam.   Review of Systems Chest heaviness Cardiac Risk Factors include: obesity (BMI >30kg/m2)      Objective:    Today's Vitals   02/27/22 1516  BP: (!) 143/79  Pulse: 88  SpO2: 95%  Weight: 218 lb (98.9 kg)  Height: '5\' 4"'$  (1.626 m)  PainSc: 2   Body mass index is 37.42 kg/m.  Medications Outpatient Encounter Medications as of 02/27/2022  Medication Sig   amLODipine (NORVASC) 5 MG tablet Take 1 tablet (5 mg total) by mouth daily.   atorvastatin (LIPITOR) 20 MG tablet Take 1 tablet (20 mg total) by mouth daily.   Cholecalciferol (VITAMIN D3) 25 MCG (1000 UT) CAPS Take 1 capsule (1,000 Units total) by mouth daily.   cyclobenzaprine (FLEXERIL) 5 MG tablet Take 1 tablet (5 mg total) by mouth 3 (three) times daily as needed for muscle spasms.   EPINEPHrine 0.3 mg/0.3 mL IJ SOAJ injection Inject 0.3 mg into the muscle as needed for anaphylaxis.   lidocaine-prilocaine (EMLA) cream Apply to port site prior to use   MAGNESIUM-OXIDE 400 (240 Mg) MG tablet Take 1 tablet by mouth twice daily   oxyCODONE-acetaminophen (PERCOCET) 5-325 MG tablet Take 1 tablet by mouth every 12 (twelve) hours as needed (pain).   PACLITAXEL IV Inject into the vein every 21 ( twenty-one) days.   pregabalin (LYRICA) 75 MG capsule Take 1 capsule (75 mg total) by mouth 3 (three) times daily.   prochlorperazine (COMPAZINE) 10 MG tablet Take 1 tablet (10 mg total) by mouth every 6 (six) hours as needed (Nausea or vomiting).   sertraline (ZOLOFT) 100 MG tablet Take 1 tablet (100 mg total) by mouth daily.   Facility-Administered Encounter Medications as of 02/27/2022  Medication   diphenhydrAMINE (BENADRYL) 50 MG/ML injection   palonosetron (ALOXI) 0.25 MG/5ML injection     History: Past Medical History:  Diagnosis Date   Allergy    Cancer ([facility])    Phreesia 09/05/2019   Elevated blood pressure reading     Endometrial cancer ([facility])    Hyperlipidemia    Hypertension    PMB (postmenopausal bleeding)    Port-A-Cath in place 12/24/2018   Past Surgical History:  Procedure Laterality Date   ABDOMINAL HYSTERECTOMY N/A    Phreesia 09/05/2019   IR IMAGING GUIDED PORT INSERTION  12/28/2018   IR RADIOLOGIST EVAL & MGMT  06/02/2018   IR RADIOLOGIST EVAL & MGMT  06/16/2018   NECK SURGERY  2000   spinal surgery , titanium rod in place    ROBOTIC ASSISTED TOTAL HYSTERECTOMY WITH BILATERAL SALPINGO OOPHERECTOMY N/A 04/13/2018   Procedure: XI ROBOTIC ASSISTED TOTAL HYSTERECTOMY WITH BILATERAL SALPINGO OOPHORECTOMY;  Surgeon: Everitt Amber, MD;  Location: [facility];  Service: Gynecology;  Laterality: N/A;   ROBOTIC PELVIC AND PARA-AORTIC LYMPH NODE DISSECTION N/A 04/13/2018   Procedure: XI ROBOTIC PELVIC LYMPHADECTOMY AND PARA-AORTIC LYMPH NODE DISSECTION;  Surgeon: Everitt Amber, MD;  Location: [facility];  Service: Gynecology;  Laterality: N/A;    Family History  Problem Relation Age of Onset   Hypertension Mother    Breast cancer Mother    Hypertension Father    Heart disease Father    Cancer Father        lung   Cancer Sister        ovarian cancer   Melanoma Sister    Social History   Occupational History  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  Tobacco Use   Smoking status: Never   Smokeless tobacco: Never  Vaping Use   Vaping Use: Never used  Substance and Sexual Activity   Alcohol use: Yes    Alcohol/week: 4.0 standard drinks of alcohol    Types: 4 Glasses of wine per week    Comment: weekends   Drug use: Never   Sexual activity: Not Currently    Tobacco Counseling Counseling given: Not Answered   Immunizations and Health Maintenance Immunization History  Administered Date(s) Administered   Influenza,inj,Quad PF,6+ Mos 10/14/2019, 10/25/2020, 10/02/2021   Moderna Covid-19 Vaccine Bivalent Booster 36yr & up 12/21/2019, 02/15/2020   Moderna Sars-Covid-2 Vaccination 04/12/2019,  05/10/2019, 11/27/2020   Zoster Recombinat (Shingrix) 11/27/2020, 05/05/2021   Health Maintenance Due  Topic Date Due   DTaP/Tdap/Td (1 - Tdap) Never done   COVID-19 Vaccine (6 - 2023-24 season) 09/20/2021    Activities of Daily Living    02/27/2022    3:22 PM  In your present state of health, do you have any difficulty performing the following activities:  Hearing? 0  Vision? 0  Difficulty concentrating or making decisions? 0  Walking or climbing stairs? 1  Dressing or bathing? 0  Doing errands, shopping? 0  Preparing Food and eating ? N  Using the Toilet? N  In the past six months, have you accidently leaked urine? Y  Do you have problems with loss of bowel control? Y  Managing your Medications? N  Managing your Finances? N  Housekeeping or managing your Housekeeping? N   Advanced Directives: Does Patient Have a Medical Advance Directive?: Yes, No Does patient want to make changes to medical advance directive?: No - Patient declined    Assessment:    This is a routine wellness examination for this patient .   Vision/Hearing screen No results found.  Dietary issues and exercise activities discussed:  Current Exercise Habits: The patient does not participate in regular exercise at present   Goals      Patient Stated     Wants to stay healthy and alive      Depression Screen    02/27/2022    3:21 PM 02/27/2022    3:20 PM 12/19/2021    9:33 AM 06/18/2021    1:28 PM  PHQ 2/9 Scores  PHQ - 2 Score '2 2 2 '$ 0  PHQ- 9 Score '7 7 16      '$ Fall Risk    02/27/2022    3:21 PM  Fall Risk   Falls in the past year? 1  Number falls in past yr: 0  Injury with Fall? 0  Risk for fall due to : No Fall Risks  Follow up Falls evaluation completed    Cognitive Function:        02/27/2022    3:22 PM  6CIT Screen  What Year? 0 points  What month? 0 points  What time? 0 points  Count back from 20 0 points  Months in reverse 0 points  Repeat phrase 0 points  Total Score 0  points    Patient Care Team: DJohnette Abraham MD as PCP - General (Internal Medicine) WDonetta Potts RN as Oncology Nurse Navigator KDerek Jack MD as Medical Oncologist (Oncology)     Plan:     I have personally reviewed and noted the following in the patient's chart:   Medical and social history Use of alcohol, tobacco or illicit drugs  Current  medications and supplements Functional ability and status Nutritional status Physical activity Advanced directives List of other physicians Hospitalizations, surgeries, and ER visits in previous 12 months Vitals Screenings to include cognitive, depression, and falls Referrals and appointments  In addition, I have reviewed and discussed with patient certain preventive protocols, quality metrics, and best practice recommendations. A written personalized care plan for preventive services as well as general preventive health recommendations were provided to patient.     Johnette Abraham, MD 02/27/2022

## 2022-02-27 NOTE — Patient Instructions (Signed)
It was a pleasure to see you today.  Thank you for giving [place] the opportunity to be involved in your care.  Below is a brief recap of your visit and next steps.  We will plan to see you again in 3 months.  Summary Welcome to Medicare appointment completed today I recommend presenting to the emergency department if you continue to experience chest heaviness We will tentatively plan for follow up in 3 months

## 2022-02-27 NOTE — Progress Notes (Signed)
Patient was evaluated by me today for a Welcome to medicare appointment. She endorsed recent episodes of chest heaviness with radiation of pain to her shoulder, back, and arms. She additionally reported dizziness and nausea. This seems to occur while at work. I have placed a cardiology referral for further workup and evaluation as she possesses multiple cardiac risk factors. I additionally recommended that she present to the emergency department if her symptoms recur.

## 2022-03-04 ENCOUNTER — Inpatient Hospital Stay (HOSPITAL_BASED_OUTPATIENT_CLINIC_OR_DEPARTMENT_OTHER): Payer: 59 | Admitting: Hematology

## 2022-03-04 VITALS — BP 146/74 | HR 69 | Temp 97.7°F | Resp 18 | Ht 64.0 in | Wt 217.4 lb

## 2022-03-04 DIAGNOSIS — C541 Malignant neoplasm of endometrium: Secondary | ICD-10-CM

## 2022-03-04 NOTE — Patient Instructions (Addendum)
[facility]  Discharge Instructions  You were seen and examined today by Dr. Delton Coombes.  Dr. Delton Coombes discussed your most recent lab work and CT scan which revealed that everything looks good and stable.  Start taking Compazine to help with nausea in the morning.  Keep a log of your blood pressure and see how it is running at home.  Follow-up as scheduled in 3 months with labs and CT of abdomen and pelvis prior.    Thank you for choosing [facility] to provide your oncology and hematology care.   To afford each patient quality time with our provider, please arrive at least 15 minutes before your scheduled appointment time. You may need to reschedule your appointment if you arrive late (10 or more minutes). Arriving late affects you and other patients whose appointments are after yours.  Also, if you miss three or more appointments without notifying the office, you may be dismissed from the clinic at the provider's discretion.    Again, thank you for choosing [facility].  Our hope is that these requests will decrease the amount of time that you wait before being seen by our physicians.   If you have a lab appointment with the [facility] please come in thru the Main Entrance and check in at the main information desk.           _____________________________________________________________  Should you have questions after your visit to [facility] please contact our office at [phone] and follow the prompts.  Our office hours are 8:00 a.m. to 4:30 p.m. Monday - Thursday and 8:00 a.m. to 2:30 p.m. Friday.  Please note that voicemails left after 4:00 p.m. may not be returned until the following business day.  We are closed weekends and all major holidays.  You do have access to a nurse 24-7, just call the main number to the clinic [phone] and do not press any options, hold on the line and a nurse  will answer the phone.    For prescription refill requests, have your pharmacy contact our office and allow 72 hours.    Masks are optional in the cancer centers. If you would like for your care team to wear a mask while they are taking care of you, please let them know. You may have one support person who is at least 60 years old accompany you for your appointments.

## 2022-03-04 NOTE — Progress Notes (Signed)
[facility] [address], [place] [address]    Clinic Day:  03/04/2022  Referring physician: Renee Rival, FNP  Patient Care Team: Johnette Abraham, MD as PCP - General (Internal Medicine) Donetta Potts, RN as Oncology Nurse Navigator [name], MD as Medical Oncologist (Oncology)   ASSESSMENT & PLAN:   Assessment: 1.  Recurrent endometrial carcinosarcoma: -TAH, BSO, bilateral pelvic and para-aortic lymph node resections on 04/13/2018. -Pathology showed carcinosarcoma (malignant mixed mullerian tumor) is arising in an endometrial type polyp with no myometrial invasion identified.  High-grade.  0/39 lymph nodes positive.  PT1APN0, FIGO stage Ia.  MMR normal.  MSI-stable. -CTAP on 12/09/2018 for abdominal pain showed extensive peritoneal carcinomatosis and large soft tissue mass in the pelvis. -Biopsy of the omental mass on 12/28/2018 shows poorly differentiated carcinoma consistent with her prior malignancy. -Carboplatin and paclitaxel started on 12/29/2018. -CT scan on 05/02/2019 showed peritoneal implants in the low central small bowel mesentery and left pelvic sidewall have decreased in size measuring 1.5 x 1.9 cm and 1.7 x 2.2 cm.  Soft tissue nodule in the left lateral omentum measures 1 x 1.7 cm with no evidence of metastatic disease. -Continuation of chemotherapy until complete response was recommended. -CT AP on 07/04/2019 showed continued positive response to therapy with scattered peritoneal metastasis in the pelvis and left omentum decreased in size.  No new metastatic disease. -CTAP on 10/24/2019 after 14 cycles showed substantial reduction in size of soft tissue nodules in the pelvis.  1.4 x 0.9 x 1 cm soft tissue density in the inferior serosal margin of the sigmoid colon, previously 1.7 x 1.1 x 1.7 cm.  2 small soft tissue nodules in the mesenteric adipose tissue above the urinary bladder measures 0.8 cm. -She has developed serious  allergic reaction during cycle 14 with carboplatin. -I have discussed with Dr. Denman George.  Other options include adding ifosfamide to Taxol or Doxil and combination of lenvatinib with pembrolizumab.  In the lenvatinib pembrolizumab trial carcinosarcomas were not included. -Single agent paclitaxel started on 11/09/2019, dose reduced by 20% on 01/18/2020. -CTAP on 01/10/2020 with stable small peritoneal soft tissue nodules in the pelvis.  Resolution of soft tissue nodularity in the left abdominal omental fat with no new or progressive disease.   2.  Breast abnormality: -Mammogram on 05/02/2019 shows BI-RADS Category 0.  She reportedly took Covid shot 1 to 2 weeks prior to the mammogram.  Further scans rescheduled on 06/21/2019.   3.  Peripheral neuropathy: -She reported numbness and occasional pins-and-needles sensation in the bilateral toes, right more than left.  This has started about a week ago.    Plan: 1.  Recurrent endometrial carcinosarcoma: - She denies any abdominal pains. - Reviewed labs from 02/25/2022 with normal LFTs and CBC.  CA125 is 6.4. - CTAP (02/25/2022): Tiny pelvic peritoneal nodules unchanged.  No evidence of metastatic disease.  New peribronchovascular groundglass nodularity in the right lower lobe.  She denies any recent infections.  Will closely monitor. - She is seeing cardiology for atypical chest pain. - RTC 3 months for follow-up with repeat labs and scan.   2.  Low back pain/right-sided abdominal pain: - She has right hip pain more than left hip pain.  Continue oxycodone as needed.   3.  Hypertension: - Continue Norvasc 5 mg daily.  Blood pressure is 146/74.   4.  Neuropathy in the legs: - Continue Lyrica 75 mg 3 times daily.   5.  Abnormal mammogram: -  Mammogram on 06/19/2021 was BI-RADS Category 1.  6.  Hypomagnesemia: - Continue magnesium 1 tablet daily.  Magnesium is normal.  No orders of the defined types were placed in this encounter.     [name],acting as a scribe for [name], MD.,have documented all relevant documentation on the behalf of [name], MD,as directed by  [name], MD while in the presence of [name], MD.   I, [name] MD, have reviewed the above documentation for accuracy and completeness, and I agree with the above.   [name]   2/13/20243:15 PM  CHIEF COMPLAINT:   Diagnosis: recurrent endometrial cancer     Cancer Staging  No matching staging information was found for the patient.   Prior Therapy: TAH & BSO, bilateral pelvic and para-aortic lymph nodes dissections on 04/13/2018 2.  Paclitaxel, last dose on 08/22/2021  Current Therapy:  Surveillance    HISTORY OF PRESENT ILLNESS:   Oncology History  Endometrial cancer ([place])  03/31/2018 Initial Diagnosis   Endometrial cancer ([place])   12/29/2018 -  Chemotherapy   Patient is on Treatment Plan : UTERINE Carboplatin AUC 6 / Paclitaxel q21d     06/06/2020 Miscellaneous   Summary of oncologic history from Dr. Delton Coombes   1.  Recurrent endometrial carcinosarcoma: -TAH, BSO, bilateral pelvic and para-aortic lymph node resections on 04/13/2018. -Pathology showed carcinosarcoma (malignant mixed mullerian tumor) is arising in an endometrial type polyp with no myometrial invasion identified.  High-grade.  0/39 lymph nodes positive.  PT1APN0, FIGO stage Ia.  MMR normal.  MSI-stable. -CTAP on 12/09/2018 for abdominal pain showed extensive peritoneal carcinomatosis and large soft tissue mass in the pelvis. -Biopsy of the omental mass on 12/28/2018 shows poorly differentiated carcinoma consistent with her prior malignancy. -Carboplatin and paclitaxel started on 12/29/2018. -CT scan on 05/02/2019 showed peritoneal implants in the low central small bowel mesentery and left pelvic sidewall have decreased in size measuring 1.5 x 1.9 cm and 1.7 x 2.2 cm.  Soft tissue nodule in the left lateral omentum measures  1 x 1.7 cm with no evidence of metastatic disease. -Continuation of chemotherapy until complete response was recommended. -CT AP on 07/04/2019 showed continued positive response to therapy with scattered peritoneal metastasis in the pelvis and left omentum decreased in size.  No new metastatic disease. -CTAP on 10/24/2019 after 14 cycles showed substantial reduction in size of soft tissue nodules in the pelvis.  1.4 x 0.9 x 1 cm soft tissue density in the inferior serosal margin of the sigmoid colon, previously 1.7 x 1.1 x 1.7 cm.  2 small soft tissue nodules in the mesenteric adipose tissue above the urinary bladder measures 0.8 cm. -She has developed serious allergic reaction during cycle 14 with carboplatin. -I have discussed with Dr. Denman George.  Other options include adding ifosfamide to Taxol or Doxil and combination of lenvatinib with pembrolizumab.  In the lenvatinib pembrolizumab trial carcinosarcomas were not included. -Single agent paclitaxel started on 11/09/2019, dose reduced by 20% on 01/18/2020. -CTAP on 01/10/2020 with stable small peritoneal soft tissue nodules in the pelvis.  Resolution of soft tissue nodularity in the left abdominal omental fat with no new or progressive disease.     06/20/2020 Imaging   1. Stable examination status post hysterectomy without suspicious enhancing soft tissue nodularity at the vaginal cuff and no significant change in the multiple small soft tissue nodules in the pelvis likely representing treated tumor. No new or progressive findings. 2. Cholelithiasis including a large gallstone  in the neck of the gallbladder but without CT evidence of acute cholecystitis. 3. Small hiatal hernia. 4. Aortic atherosclerosis.        INTERVAL HISTORY:   Jessica [name] is a 62 y.o. female presenting to clinic today for follow up of recurrent endometrial cancer. She was last seen by me on 11/25/2021.  Today, she states that she is doing well overall. Her appetite level is at 75%.  Her energy level is at 40%. She has pain 6/10 in her bilateral hips R>L. She started feeling dizzy intermittently. She continues to feel nauseated intermittently since last visit. She has had x2 episodes of vomiting. She is using Compazine with relief of her nausea. She denies any abdominal pain. She denies any recent infections. She denies being in contact with anyone sick. She takes Percocet when her pain is most severe such as on work days or when she is more active.  PAST MEDICAL HISTORY:   Past Medical History: Past Medical History:  Diagnosis Date   Allergy    Cancer ([place])    Phreesia 09/05/2019   Elevated blood pressure reading    Endometrial cancer ([place])    Hyperlipidemia    Hypertension    PMB (postmenopausal bleeding)    Port-A-Cath in place 12/24/2018    Surgical History: Past Surgical History:  Procedure Laterality Date   ABDOMINAL HYSTERECTOMY N/A    Phreesia 09/05/2019   IR IMAGING GUIDED PORT INSERTION  12/28/2018   IR RADIOLOGIST EVAL & MGMT  06/02/2018   IR RADIOLOGIST EVAL & MGMT  06/16/2018   NECK SURGERY  2000   spinal surgery , titanium rod in place    ROBOTIC ASSISTED TOTAL HYSTERECTOMY WITH BILATERAL SALPINGO OOPHERECTOMY N/A 04/13/2018   Procedure: XI ROBOTIC ASSISTED TOTAL HYSTERECTOMY WITH BILATERAL SALPINGO OOPHORECTOMY;  Surgeon: Everitt Amber, MD;  Location: [facility];  Service: Gynecology;  Laterality: N/A;   ROBOTIC PELVIC AND PARA-AORTIC LYMPH NODE DISSECTION N/A 04/13/2018   Procedure: XI ROBOTIC PELVIC LYMPHADECTOMY AND PARA-AORTIC LYMPH NODE DISSECTION;  Surgeon: Everitt Amber, MD;  Location: [facility];  Service: Gynecology;  Laterality: N/A;    Social History: Social History   Socioeconomic History   Marital status: Widowed    Spouse name: Not on file   Number of children: 3   Years of education: Not on file   Highest education level: Not on file  Occupational History   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  Tobacco Use   Smoking status:  Never   Smokeless tobacco: Never  Vaping Use   Vaping Use: Never used  Substance and Sexual Activity   Alcohol use: Yes    Alcohol/week: 4.0 standard drinks of alcohol    Types: 4 Glasses of wine per week    Comment: weekends   Drug use: Never   Sexual activity: Not Currently  Other Topics Concern   Not on file  Social History Narrative      Wears seat belt    Does not use phone while driving    Smoke detectors at home   Fire extinguisher-no   No weapons   Social Determinants of Health   Financial Resource Strain: Low Risk  (02/27/2022)   Overall Financial Resource Strain (CARDIA)    Difficulty of Paying Living Expenses: Not hard at all  Food Insecurity: No Food Insecurity (02/27/2022)   Hunger Vital Sign    Worried About Running Out of Food in the Last Year: Never true    Ran Out of Food  in the Last Year: Never true  Transportation Needs: No Transportation Needs (02/27/2022)   PRAPARE - Hydrologist (Medical): No    Lack of Transportation (Non-Medical): No  Physical Activity: Sufficiently Active (02/27/2022)   Exercise Vital Sign    Days of Exercise per Week: 7 days    Minutes of Exercise per Session: 60 min  Stress: Stress Concern Present (02/27/2022)   [place]    Feeling of Stress : Rather much  Social Connections: Socially Isolated (02/27/2022)   Social Connection and Isolation Panel [NHANES]    Frequency of Communication with Friends and Family: More than three times a week    Frequency of Social Gatherings with Friends and Family: More than three times a week    Attends Religious Services: Never    Marine scientist or Organizations: No    Attends Archivist Meetings: Never    Marital Status: Widowed  Intimate Partner Violence: Not At Risk (02/27/2022)   Humiliation, Afraid, Rape, and Kick questionnaire    Fear of Current or Ex-Partner: No    Emotionally Abused:  No    Physically Abused: No    Sexually Abused: No    Family History: Family History  Problem Relation Age of Onset   Hypertension Mother    Breast cancer Mother    Hypertension Father    Heart disease Father    Cancer Father        lung   Cancer Sister        ovarian cancer   Melanoma Sister     Current Medications:  Current Outpatient Medications:    amLODipine (NORVASC) 5 MG tablet, Take 1 tablet (5 mg total) by mouth daily., Disp: 90 tablet, Rfl: 1   atorvastatin (LIPITOR) 20 MG tablet, Take 1 tablet (20 mg total) by mouth daily., Disp: 90 tablet, Rfl: 1   Cholecalciferol (VITAMIN D3) 25 MCG (1000 UT) CAPS, Take 1 capsule (1,000 Units total) by mouth daily., Disp: 60 capsule, Rfl: 2   cyclobenzaprine (FLEXERIL) 5 MG tablet, Take 1 tablet (5 mg total) by mouth 3 (three) times daily as needed for muscle spasms., Disp: 30 tablet, Rfl: 1   EPINEPHrine 0.3 mg/0.3 mL IJ SOAJ injection, Inject 0.3 mg into the muscle as needed for anaphylaxis., Disp: 1 each, Rfl: 0   lidocaine-prilocaine (EMLA) cream, Apply to port site prior to use, Disp: 30 g, Rfl: 0   MAGNESIUM-OXIDE 400 (240 Mg) MG tablet, Take 1 tablet by mouth twice daily, Disp: 60 tablet, Rfl: 0   oxyCODONE-acetaminophen (PERCOCET) 5-325 MG tablet, Take 1 tablet by mouth every 12 (twelve) hours as needed (pain)., Disp: 60 tablet, Rfl: 0   PACLITAXEL IV, Inject into the vein every 21 ( twenty-one) days., Disp: , Rfl:    pregabalin (LYRICA) 75 MG capsule, Take 1 capsule (75 mg total) by mouth 3 (three) times daily., Disp: 90 capsule, Rfl: 3   prochlorperazine (COMPAZINE) 10 MG tablet, Take 1 tablet (10 mg total) by mouth every 6 (six) hours as needed (Nausea or vomiting)., Disp: 60 tablet, Rfl: 3   sertraline (ZOLOFT) 100 MG tablet, Take 1 tablet (100 mg total) by mouth daily., Disp: 90 tablet, Rfl: 1 No current facility-administered medications for this visit.  Facility-Administered Medications Ordered in Other Visits:     diphenhydrAMINE (BENADRYL) 50 MG/ML injection, , , ,    palonosetron (ALOXI) 0.25 MG/5ML injection, , , ,  Allergies: Allergies  Allergen Reactions   Carboplatin Other (See Comments)    "body on fire and abdominal pain"   Nickel Rash    itchy    REVIEW OF SYSTEMS:   Review of Systems  Constitutional:  Negative for chills, fatigue and fever.  HENT:   Negative for lump/mass, mouth sores, nosebleeds, sore throat and trouble swallowing.   Eyes:  Negative for eye problems.  Respiratory:  Positive for cough and shortness of breath.   Cardiovascular:  Positive for chest pain (Tightness). Negative for leg swelling and palpitations.  Gastrointestinal:  Positive for diarrhea, nausea and vomiting. Negative for abdominal pain and constipation.  Genitourinary:  Positive for frequency. Negative for bladder incontinence, difficulty urinating, dysuria, hematuria and nocturia.   Musculoskeletal:  Positive for arthralgias (BLE R>L). Negative for back pain, flank pain, myalgias and neck pain.  Skin:  Negative for itching and rash.  Neurological:  Positive for dizziness, headaches and numbness.  Hematological:  Does not bruise/bleed easily.  Psychiatric/Behavioral:  Negative for depression, sleep disturbance and suicidal ideas. The patient is not nervous/anxious.   All other systems reviewed and are negative.    VITALS:   Blood pressure (!) 146/74, pulse 69, temperature 97.7 F (36.5 C), temperature source Tympanic, resp. rate 18, height 5' 4"$  (1.626 m), weight 217 lb 6.4 oz (98.6 kg), SpO2 98 %.  Wt Readings from Last 3 Encounters:  03/04/22 217 lb 6.4 oz (98.6 kg)  02/27/22 218 lb (98.9 kg)  02/25/22 220 lb (99.8 kg)    Body mass index is 37.32 kg/m.  Performance status (ECOG): 1 - Symptomatic but completely ambulatory  PHYSICAL EXAM:   Physical Exam Vitals and nursing note reviewed. Exam conducted with a chaperone present.  Constitutional:      Appearance: Normal appearance.   Cardiovascular:     Rate and Rhythm: Normal rate and regular rhythm.     Pulses: Normal pulses.     Heart sounds: Normal heart sounds.  Pulmonary:     Effort: Pulmonary effort is normal.     Breath sounds: Normal breath sounds.  Abdominal:     Palpations: Abdomen is soft. There is no hepatomegaly, splenomegaly or mass.     Tenderness: There is no abdominal tenderness.  Musculoskeletal:     Right lower leg: No edema.     Left lower leg: No edema.  Lymphadenopathy:     Cervical: No cervical adenopathy.     Right cervical: No superficial, deep or posterior cervical adenopathy.    Left cervical: No superficial, deep or posterior cervical adenopathy.     Upper Body:     Right upper body: No supraclavicular or axillary adenopathy.     Left upper body: No supraclavicular or axillary adenopathy.  Neurological:     General: No focal deficit present.     Mental Status: She is alert and oriented to person, place, and time.  Psychiatric:        Mood and Affect: Mood normal.        Behavior: Behavior normal.     LABS:      Latest Ref Rng & Units 02/25/2022    8:18 AM 11/20/2021   10:09 AM 08/22/2021    8:11 AM  CBC  WBC 4.0 - 10.5 K/uL 8.1  7.9  7.6   Hemoglobin 12.0 - 15.0 g/dL 13.0  11.9  12.0   Hematocrit 36.0 - 46.0 % 40.4  36.1  36.9   Platelets 150 - 400 K/uL 310  315  294       Latest Ref Rng & Units 02/25/2022    8:18 AM 11/20/2021   10:09 AM 08/22/2021    8:11 AM  CMP  Glucose 70 - 99 mg/dL 118  115  109   BUN 6 - 20 mg/dL 21  15  18   $ Creatinine 0.44 - 1.00 mg/dL 0.60  0.55  0.70   Sodium 135 - 145 mmol/L 138  137  140   Potassium 3.5 - 5.1 mmol/L 3.6  3.5  3.5   Chloride 98 - 111 mmol/L 105  102  107   CO2 22 - 32 mmol/L 24  24  26   $ Calcium 8.9 - 10.3 mg/dL 9.4  8.9  8.8   Total Protein 6.5 - 8.1 g/dL 7.2  7.2  7.0   Total Bilirubin 0.3 - 1.2 mg/dL 0.2  0.6  0.5   Alkaline Phos 38 - 126 U/L 116  118  100   AST 15 - 41 U/L 21  31  21   $ ALT 0 - 44 U/L 26  40  20       No results found for: "CEA1", "CEA" / No results found for: "CEA1", "CEA" No results found for: "PSA1" No results found for: "CAN199" Lab Results  Component Value Date   CAN125 6.4 02/25/2022    No results found for: "TOTALPROTELP", "ALBUMINELP", "A1GS", "A2GS", "BETS", "BETA2SER", "GAMS", "MSPIKE", "SPEI" No results found for: "TIBC", "FERRITIN", "IRONPCTSAT" Lab Results  Component Value Date   LDH 160 02/09/2019   LDH 331 (H) 12/22/2018     STUDIES:   CT Abdomen Pelvis W Contrast  Result Date: 02/25/2022 CLINICAL DATA:  Ovarian cancer, recurrent endometrial cancer, receiving chemotherapy. * Tracking Code: BO * EXAM: CT ABDOMEN AND PELVIS WITH CONTRAST TECHNIQUE: Multidetector CT imaging of the abdomen and pelvis was performed using the standard protocol following bolus administration of intravenous contrast. RADIATION DOSE REDUCTION: This exam was performed according to the departmental dose-optimization program which includes automated exposure control, adjustment of the mA and/or kV according to patient size and/or use of iterative reconstruction technique. CONTRAST:  154m OMNIPAQUE IOHEXOL 300 MG/ML  SOLN COMPARISON:  11/20/2021. FINDINGS: Lower chest: Peribronchovascular ground-glass nodularity in the right lower lobe, new from 11/20/2021. No solid pulmonary nodules. Heart is at the upper limits of normal in size. No pericardial or pleural effusion. Distal esophagus is grossly unremarkable. Small hiatal hernia. Hepatobiliary: Liver is unremarkable. Gallstones. No biliary ductal dilatation. Pancreas: Negative. Spleen: Negative. Adrenals/Urinary Tract: Adrenal glands and kidneys are unremarkable. Ureters are decompressed. Bladder is low in volume. Stomach/Bowel: Stomach, small bowel, appendix and colon are unremarkable. Vascular/Lymphatic: Atherosclerotic calcification of the aorta. No pathologically enlarged lymph nodes. Reproductive: Hysterectomy.  No adnexal mass. Other: Left  pelvic sidewall nodule measures 8 mm (2/70), unchanged. Two nodules measuring up to 6 mm adjacent to small bowel loops in the low central anatomic pelvis (2/70-72), also stable. Millimetric nodules along low right paracolic gutter (299991111, similar. No free fluid. Musculoskeletal: Degenerative changes in the spine. No worrisome lytic or sclerotic lesions. Degenerative changes in the right hip. IMPRESSION: 1. Tiny pelvic peritoneal nodules, unchanged. No evidence of new metastatic disease. 2. New peribronchovascular ground-glass nodularity in the right lower lobe, likely infectious/inflammatory in etiology. Recommend attention on follow-up. 3. Cholelithiasis. 4.  Aortic atherosclerosis (ICD10-I70.0). Electronically Signed   By: MLorin PicketM.D.   On: 02/25/2022 14:53

## 2022-03-17 ENCOUNTER — Other Ambulatory Visit: Payer: Self-pay | Admitting: Hematology

## 2022-03-19 ENCOUNTER — Ambulatory Visit (INDEPENDENT_AMBULATORY_CARE_PROVIDER_SITE_OTHER): Payer: 59

## 2022-03-19 ENCOUNTER — Ambulatory Visit
Admission: EM | Admit: 2022-03-19 | Discharge: 2022-03-19 | Disposition: A | Discharge status: Home / Self Care | Payer: 59 | Attending: Family Medicine | Admitting: Family Medicine

## 2022-03-19 DIAGNOSIS — M25531 Pain in right wrist: Secondary | ICD-10-CM

## 2022-03-19 DIAGNOSIS — W19XXXA Unspecified fall, initial encounter: Secondary | ICD-10-CM | POA: Diagnosis not present

## 2022-03-19 DIAGNOSIS — M79641 Pain in right hand: Secondary | ICD-10-CM

## 2022-03-19 HISTORY — DX: Unspecified malignant neoplasm of skin of other part of trunk: C44.509

## 2022-03-19 NOTE — Discharge Instructions (Signed)
Ice, elevate, Wear the brace for comfort, over-the-counter pain relievers as needed.

## 2022-03-19 NOTE — ED Provider Notes (Signed)
[facility]    CSN: [id number] Arrival date & time: 03/19/22  1131      History   Chief Complaint No chief complaint on file.   HPI Jessica [name] is a 62 y.o. female.   Patient presenting today with new onset right wrist pain, decreased range of motion after tripping and falling overnight on the way to grab a drink of water.  She states she had a mechanical fall in her home, catching herself on her right hand and is now having pain into the right hand and wrist.  Slight swelling to the area, no bruising, numbness, tingling, complete loss of range of motion.  She states the pain is so significant that is causing nausea.  Has not yet taken anything for her pain but does have some leftover Percocet from a hip issue as well as some nausea medication at home.    Past Medical History:  Diagnosis Date   Allergy    Cancer ([place])    Phreesia 09/05/2019   Cancer of skin of back    Elevated blood pressure reading    Endometrial cancer (HCC)    Hyperlipidemia    Hypertension    PMB (postmenopausal bleeding)    Port-A-Cath in place 12/24/2018    Patient Active Problem List   Diagnosis Date Noted   Cholelithiasis 12/20/2021   Vitamin D deficiency 06/18/2021   Prediabetes 06/18/2021   Annual physical exam 06/18/2021   Impaired vision in both eyes 06/18/2021   Breast tenderness in female 06/18/2021   Neck pain, acute 02/11/2021   Headache 02/11/2021   Morbid obesity ([place]) 01/28/2021   Acute bronchitis 01/28/2021   Insomnia 01/28/2021   SIRS (systemic inflammatory response syndrome) ([place]) 12/07/2020   Hypotension    Peripheral neuropathy due to chemotherapy ([place]) 06/27/2020   Anemia due to antineoplastic chemotherapy 06/27/2020   Depression, major, single episode, moderate ([place]) 09/07/2019   Anxiety 09/07/2019   Port-A-Cath in place 12/24/2018   Goals of care, counseling/discussion 12/23/2018   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    Elevated LFTs 05/05/2018   Hyperlipidemia 04/27/2018   Essential hypertension 04/26/2018   Endometrial cancer ([place]) 03/31/2018   H/O total hysterectomy 03/2018    Past Surgical History:  Procedure Laterality Date   ABDOMINAL HYSTERECTOMY N/A    Phreesia 09/05/2019   IR IMAGING GUIDED PORT INSERTION  12/28/2018   IR RADIOLOGIST EVAL & MGMT  06/02/2018   IR RADIOLOGIST EVAL & MGMT  06/16/2018   NECK SURGERY  2000   spinal surgery , titanium rod in place    ROBOTIC ASSISTED TOTAL HYSTERECTOMY WITH BILATERAL SALPINGO OOPHERECTOMY N/A 04/13/2018   Procedure: XI ROBOTIC [place];  Surgeon: Everitt Amber, MD;  Location: [facility];  Service: Gynecology;  Laterality: N/A;   ROBOTIC PELVIC AND PARA-AORTIC LYMPH NODE DISSECTION N/A 04/13/2018   Procedure: XI ROBOTIC PELVIC LYMPHADECTOMY AND PARA-AORTIC LYMPH NODE DISSECTION;  Surgeon: Everitt Amber, MD;  Location: [facility];  Service: Gynecology;  Laterality: N/A;    OB History     Gravida  4   Para  3   Term  3   Preterm  0   AB  1   Living  3      SAB  0   IAB  1   Ectopic  0   Multiple  0   Live Births  3            Home Medications  Prior to Admission medications   Medication Sig Start Date End Date Taking? Authorizing Provider  amLODipine (NORVASC) 5 MG tablet Take 1 tablet (5 mg total) by mouth daily. 12/19/21   Johnette Abraham, MD  atorvastatin (LIPITOR) 20 MG tablet Take 1 tablet (20 mg total) by mouth daily. 12/19/21   Johnette Abraham, MD  Cholecalciferol (VITAMIN D3) 25 MCG (1000 UT) CAPS Take 1 capsule (1,000 Units total) by mouth daily. 06/18/21   [place], Dewaine Conger, FNP  cyclobenzaprine (FLEXERIL) 5 MG tablet Take 1 tablet (5 mg total) by mouth 3 (three) times daily as needed for muscle spasms. 02/11/21   Renee Rival, FNP  EPINEPHrine 0.3 mg/0.3 mL IJ SOAJ injection Inject 0.3 mg into the muscle as needed for anaphylaxis. 10/05/19   Evalee Jefferson,  PA-C  lidocaine-prilocaine (EMLA) cream Apply to port site prior to use 08/09/20   [name], MD  MAGNESIUM-OXIDE 400 (240 Mg) MG tablet Take 1 tablet by mouth twice daily 03/17/22   [name], MD  oxyCODONE-acetaminophen (PERCOCET) 5-325 MG tablet Take 1 tablet by mouth every 12 (twelve) hours as needed (pain). 02/11/22   [name], MD  PACLITAXEL IV Inject into the vein every 21 ( twenty-one) days. 12/29/18   [provider]  pregabalin (LYRICA) 75 MG capsule Take 1 capsule (75 mg total) by mouth 3 (three) times daily. 08/22/21   [name], MD  prochlorperazine (COMPAZINE) 10 MG tablet Take 1 tablet (10 mg total) by mouth every 6 (six) hours as needed (Nausea or vomiting). 11/25/21   [name], MD  sertraline (ZOLOFT) 100 MG tablet Take 1 tablet (100 mg total) by mouth daily. 12/19/21 06/17/22  Johnette Abraham, MD    Family History Family History  Problem Relation Age of Onset   Hypertension Mother    Breast cancer Mother    Hypertension Father    Heart disease Father    Cancer Father        lung   Cancer Sister        ovarian cancer   Melanoma Sister     Social History Social History   Tobacco Use   Smoking status: Never   Smokeless tobacco: Never  Vaping Use   Vaping Use: Never used  Substance Use Topics   Alcohol use: Yes    Alcohol/week: 4.0 standard drinks of alcohol    Types: 4 Glasses of wine per week    Comment: weekends   Drug use: Never     Allergies   Carboplatin and Nickel   Review of Systems Review of Systems Per HPI  Physical Exam Triage Vital Signs ED Triage Vitals  Enc Vitals Group     BP 03/19/22 1152 (!) 145/75     Pulse Rate 03/19/22 1152 89     Resp 03/19/22 1152 18     Temp 03/19/22 1152 98.7 F (37.1 C)     Temp Source 03/19/22 1152 Oral     SpO2 03/19/22 1152 95 %     Weight --      Height --      Head Circumference --      Peak Flow --      Pain Score 03/19/22 1156 7      Pain Loc --      Pain Edu? --      Excl. in [place]? --    No data found.  Updated Vital Signs BP (!) 145/75 (BP Location: Right Arm)   Pulse 89  Temp 98.7 F (37.1 C) (Oral)   Resp 18   SpO2 95%   Visual Acuity Right Eye Distance:   Left Eye Distance:   Bilateral Distance:    Right Eye Near:   Left Eye Near:    Bilateral Near:     Physical Exam Vitals and nursing note reviewed.  Constitutional:      Appearance: Normal appearance. She is not ill-appearing.  HENT:     Head: Atraumatic.  Eyes:     Extraocular Movements: Extraocular movements intact.     Conjunctiva/sclera: Conjunctivae normal.  Cardiovascular:     Rate and Rhythm: Normal rate and regular rhythm.     Heart sounds: Normal heart sounds.  Pulmonary:     Effort: Pulmonary effort is normal.     Breath sounds: Normal breath sounds.  Musculoskeletal:        General: Swelling, tenderness and signs of injury present. No deformity.     Cervical back: Normal range of motion and neck supple.     Comments: Decreased range of motion to the right wrist and hand on exam due to pain.  No bone deformities palpable.  Trace edema to the area.  Skin:    General: Skin is warm and dry.     Findings: No bruising, erythema or lesion.  Neurological:     Mental Status: She is alert and oriented to person, place, and time.     Motor: No weakness.     Gait: Gait normal.     Comments: Right upper extremity neurovascularly intact  Psychiatric:        Mood and Affect: Mood normal.        Thought Content: Thought content normal.        Judgment: Judgment normal.      UC Treatments / Results  Labs (all labs ordered are listed, but only abnormal results are displayed) Labs Reviewed - No data to display  EKG   Radiology DG Hand Complete Right  Result Date: 03/19/2022 CLINICAL DATA:  [place].  Right hand pain. EXAM: RIGHT HAND - COMPLETE 3+ VIEW COMPARISON:  None Available. FINDINGS: Mild interphalangeal joint degenerative  changes. The other joint spaces are maintained. No acute fracture involving the hand or wrist is identified. IMPRESSION: 1. No acute bony findings. 2. Mild interphalangeal joint degenerative changes. Electronically Signed   By: Marijo Sanes M.D.   On: 03/19/2022 12:28   DG Wrist Complete Right  Result Date: 03/19/2022 CLINICAL DATA:  RIGHT wrist and hand pain post fall EXAM: RIGHT WRIST - COMPLETE 3+ VIEW COMPARISON:  RIGHT forearm radiographs 06/10/2019 FINDINGS: Osseous mineralization low normal. Joint spaces preserved. No acute fracture, dislocation, or bone destruction. IMPRESSION: Normal exam. Electronically Signed   By: Lavonia Dana M.D.   On: 03/19/2022 12:20    Procedures Procedures (including critical care time)  Medications Ordered in UC Medications - No data to display  Initial Impression / Assessment and Plan / UC Course  I have reviewed the triage vital signs and the nursing notes.  Pertinent labs & imaging results that were available during my care of the patient were reviewed by me and considered in my medical decision making (see chart for details).     X-ray of the right wrist and hand negative for acute bony abnormality.  Wrist brace placed, discussed RICE protocol, over-the-counter pain relievers.  Return for worsening symptoms.  Work note given.  Final Clinical Impressions(s) / UC Diagnoses   Final diagnoses:  Right wrist pain  Discharge Instructions      Ice, elevate, Wear the brace for comfort, over-the-counter pain relievers as needed.    ED Prescriptions   None    PDMP not reviewed this encounter.   Volney American, [place] 03/19/22 1307

## 2022-03-19 NOTE — ED Triage Notes (Signed)
Pt reports around 12 am she tripped over her feet and her face hit the wall and her right wrist down to her hand is painful. Pt has very little ROM in right hand/wrist.   Hand pain is making her feel nauseas.

## 2022-04-10 ENCOUNTER — Encounter: Payer: Self-pay | Admitting: *Deleted

## 2022-04-10 ENCOUNTER — Ambulatory Visit: Payer: 59 | Attending: Internal Medicine | Admitting: Internal Medicine

## 2022-04-10 ENCOUNTER — Encounter: Payer: Self-pay | Admitting: Internal Medicine

## 2022-04-10 VITALS — BP 136/80 | HR 73 | Ht 64.0 in | Wt 222.2 lb

## 2022-04-10 DIAGNOSIS — R079 Chest pain, unspecified: Secondary | ICD-10-CM | POA: Insufficient documentation

## 2022-04-10 MED ORDER — NITROGLYCERIN 0.4 MG SL SUBL: 0.4000 mg | SUBLINGUAL_TABLET | SUBLINGUAL | 3 refills | Status: AC | PRN

## 2022-04-10 NOTE — Patient Instructions (Signed)
Medication Instructions:  Your physician has recommended you make the following change in your medication:   Start Nitro 0.4 mg as needed for chest pain   *If you need a refill on your cardiac medications before your next appointment, please call your pharmacy*   Lab Work: NONE   If you have labs (blood work) drawn today and your tests are completely normal, you will receive your results only by: [place] (if you have MyChart) OR A paper copy in the mail If you have any lab test that is abnormal or we need to change your treatment, we will call you to review the results.   Testing/Procedures: Your physician has requested that you have a lexiscan myoview. For further information please visit [email or web address]. Please follow instruction sheet, as given.    Follow-Up: At [facility], you and your health needs are our priority.  As part of our continuing mission to provide you with exceptional heart care, we have created designated Provider Care Teams.  These Care Teams include your primary Cardiologist (physician) and Advanced Practice Providers (APPs -  Physician Assistants and Nurse Practitioners) who all work together to provide you with the care you need, when you need it.  We recommend signing up for the patient portal called "MyChart".  Sign up information is provided on this After Visit Summary.  MyChart is used to connect with patients for Virtual Visits (Telemedicine).  Patients are able to view lab/test results, encounter notes, upcoming appointments, etc.  Non-urgent messages can be sent to your provider as well.   To learn more about what you can do with MyChart, go to [email or web address].    Your next appointment:   1 year(s)  Provider:   Claudina Lick, MD    Other Instructions Thank you for choosing [facility]!

## 2022-04-10 NOTE — Progress Notes (Signed)
Cardiology Office Note  Date: 04/10/2022   ID: Raiyn, Bevill [birth year], MRN [id number]  PCP:  Johnette Abraham, MD  Cardiologist:  None Electrophysiologist:  None   Reason for Office Visit: Evaluation of chest heaviness at the request of Dr. Doren Custard   History of Present Illness: Jessica [name] is a 62 y.o. female known to have HTN, HLD, endometrial cancer on chemotherapy was referred to cardiology clinic for evaluation of chest heaviness.  Patient reported substernal chest heaviness radiating to left arm lasting for a few minutes, occurred while grocery shopping and resolved with rest. This happened a couple of months ago and no recurrence of angina since then. She also reported having a few episodes of substernal chest heaviness lasting for a few minutes in the last 1 year but unable to recall what she was doing at that time. She does not have sublingual nitroglycerin with her. Denies any symptoms of DOE, dizziness, syncope, leg swelling and palpitations. She has a strong family history of premature ASCVD (father had multiple PCI and stents in his legs, as early as in his late 29s and patient's brother had PCI in his early 40s, father and brother both were heavy smokers). Patient never smoked cigarettes in her life however had secondhand exposure to smoking while growing up.  She is currently undergoing chemotherapy for endometrial cancer that has metastasized to her stomach/peritoneum.  Due to chemotherapy, she was experiencing pain in her legs due to which her oncologist gave her a break from chemotherapy.  Past Medical History:  Diagnosis Date   Allergy    Cancer ([place])    Phreesia 09/05/2019   Cancer of skin of back    Elevated blood pressure reading    Endometrial cancer (HCC)    Hyperlipidemia    Hypertension    PMB (postmenopausal bleeding)    Port-A-Cath in place 12/24/2018    Past Surgical History:  Procedure Laterality Date   ABDOMINAL HYSTERECTOMY N/A     Phreesia 09/05/2019   IR IMAGING GUIDED PORT INSERTION  12/28/2018   IR RADIOLOGIST EVAL & MGMT  06/02/2018   IR RADIOLOGIST EVAL & MGMT  06/16/2018   NECK SURGERY  2000   spinal surgery , titanium rod in place    ROBOTIC ASSISTED TOTAL HYSTERECTOMY WITH BILATERAL SALPINGO OOPHERECTOMY N/A 04/13/2018   Procedure: XI ROBOTIC ASSISTED TOTAL HYSTERECTOMY WITH BILATERAL SALPINGO OOPHORECTOMY;  Surgeon: Everitt Amber, MD;  Location: [facility];  Service: Gynecology;  Laterality: N/A;   ROBOTIC PELVIC AND PARA-AORTIC LYMPH NODE DISSECTION N/A 04/13/2018   Procedure: XI ROBOTIC PELVIC LYMPHADECTOMY AND PARA-AORTIC LYMPH NODE DISSECTION;  Surgeon: Everitt Amber, MD;  Location: [facility];  Service: Gynecology;  Laterality: N/A;    Current Outpatient Medications  Medication Sig Dispense Refill   amLODipine (NORVASC) 5 MG tablet Take 1 tablet (5 mg total) by mouth daily. 90 tablet 1   atorvastatin (LIPITOR) 20 MG tablet Take 1 tablet (20 mg total) by mouth daily. 90 tablet 1   Cholecalciferol (VITAMIN D3) 25 MCG (1000 UT) CAPS Take 1 capsule (1,000 Units total) by mouth daily. 60 capsule 2   cyclobenzaprine (FLEXERIL) 5 MG tablet Take 1 tablet (5 mg total) by mouth 3 (three) times daily as needed for muscle spasms. 30 tablet 1   EPINEPHrine 0.3 mg/0.3 mL IJ SOAJ injection Inject 0.3 mg into the muscle as needed for anaphylaxis. 1 each 0   lidocaine-prilocaine (EMLA) cream Apply to port site prior to use 30 g 0  MAGNESIUM-OXIDE 400 (240 Mg) MG tablet Take 1 tablet by mouth twice daily 60 tablet 0   oxyCODONE-acetaminophen (PERCOCET) 5-325 MG tablet Take 1 tablet by mouth every 12 (twelve) hours as needed (pain). 60 tablet 0   pregabalin (LYRICA) 75 MG capsule Take 1 capsule (75 mg total) by mouth 3 (three) times daily. 90 capsule 3   prochlorperazine (COMPAZINE) 10 MG tablet Take 1 tablet (10 mg total) by mouth every 6 (six) hours as needed (Nausea or vomiting). 60 tablet 3   sertraline (ZOLOFT) 100 MG tablet Take  1 tablet (100 mg total) by mouth daily. 90 tablet 1   PACLITAXEL IV Inject into the vein every 21 ( twenty-one) days. (Patient not taking: Reported on 04/10/2022)     No current facility-administered medications for this visit.   Facility-Administered Medications Ordered in Other Visits  Medication Dose Route Frequency Provider Last Rate Last Admin   diphenhydrAMINE (BENADRYL) 50 MG/ML injection            palonosetron (ALOXI) 0.25 MG/5ML injection            Allergies:  Carboplatin and Nickel   Social History: The patient  reports that she has never smoked. She has never used smokeless tobacco. She reports current alcohol use of about 4.0 standard drinks of alcohol per week. She reports that she does not use drugs.   Family History: The patient's family history includes Breast cancer in her mother; Cancer in her father and sister; Heart disease in her father; Hypertension in her father and mother; Melanoma in her sister.   ROS:  Please see the history of present illness. Otherwise, complete review of systems is positive for none.  All other systems are reviewed and negative.   Physical Exam: VS:  BP 136/80   Pulse 73   Ht 5\' 4"  (1.626 m)   Wt 222 lb 3.2 oz (100.8 kg)   SpO2 94%   BMI 38.14 kg/m , BMI Body mass index is 38.14 kg/m.  Wt Readings from Last 3 Encounters:  04/10/22 222 lb 3.2 oz (100.8 kg)  03/04/22 217 lb 6.4 oz (98.6 kg)  02/27/22 218 lb (98.9 kg)    General: Patient appears comfortable at rest. HEENT: Conjunctiva and lids normal, oropharynx clear with moist mucosa. Neck: Supple, no elevated JVP or carotid bruits, no thyromegaly. Lungs: Clear to auscultation, nonlabored breathing at rest. Cardiac: Regular rate and rhythm, no S3 or significant systolic murmur, no pericardial rub. Abdomen: Soft, nontender, no hepatomegaly, bowel sounds present, no guarding or rebound. Extremities: No pitting edema, distal pulses 2+. Skin: Warm and dry. Musculoskeletal: No  kyphosis. Neuropsychiatric: Alert and oriented x3, affect grossly appropriate.  ECG: NSR  Recent Labwork: 06/12/2021: TSH 3.140 02/25/2022: ALT 26; AST 21; BUN 21; Creatinine, Ser 0.60; Hemoglobin 13.0; Magnesium 1.7; Platelets 310; Potassium 3.6; Sodium 138     Component Value Date/Time   CHOL 197 06/20/2021 1148   TRIG 63 06/20/2021 1148   HDL 81 06/20/2021 1148   CHOLHDL 2.4 06/20/2021 1148   CHOLHDL 2.7 03/04/2019 0847   VLDL 12 03/04/2019 0847   LDLCALC 104 (H) 06/20/2021 1148    Other Studies Reviewed Today: None  Assessment and Plan: Patient is a 62 year old F known to have HTN, HLD, endometrial cancer on chemotherapy was referred to cardiology clinic for evaluation of chest heaviness.  # Cardiac chest pain -Order Lexiscan -SL NTG 0.4 mg as needed. If nuclear stress test is normal and chest heaviness becomes more frequent in the  future, will need to add metoprolol tartrate 25 mg twice daily and obtain 2D echocardiogram. -ER precautions for chest pain  # HTN, controlled -Continue amlodipine 5 mg once daily -Follow-up with PCP for HTN management  # HLD, at goal -Continue atorvastatin 20 mg nightly.  Goal LDL less than 100.  I have spent a total of 45 minutes with patient reviewing chart, EKGs, labs and examining patient as well as establishing an assessment and plan that was discussed with the patient.  > 50% of time was spent in direct patient care.     Medication Adjustments/Labs and Tests Ordered: Current medicines are reviewed at length with the patient today.  Concerns regarding medicines are outlined above.   Tests Ordered: No orders of the defined types were placed in this encounter.   Medication Changes: No orders of the defined types were placed in this encounter.   Disposition:  Follow up  1 year  Signed, Jill Ruppe Fidel Levy, MD, 04/10/2022 7:45 AM    [facility] [address] [place], [place] [address]

## 2022-04-14 ENCOUNTER — Ambulatory Visit (HOSPITAL_COMMUNITY)
Admission: RE | Admit: 2022-04-14 | Discharge: 2022-04-14 | Disposition: A | Discharge status: Home / Self Care | Payer: 59 | Source: Ambulatory Visit | Attending: Internal Medicine | Admitting: Internal Medicine

## 2022-04-14 DIAGNOSIS — R079 Chest pain, unspecified: Secondary | ICD-10-CM | POA: Diagnosis present

## 2022-04-14 LAB — NM MYOCAR MULTI W/SPECT W/WALL MOTION / EF
LV dias vol: 77 mL (ref 46–106)
LV sys vol: 26 mL
Nuc Stress EF: 67 %
Peak HR: 98 {beats}/min
RATE: 0.5
Rest HR: 54 {beats}/min
Rest Nuclear Isotope Dose: 9.5 mCi
SDS: 3
SRS: 1
SSS: 4
ST Depression (mm): 0 mm
Stress Nuclear Isotope Dose: 28.3 mCi
TID: 1.27

## 2022-04-14 MED ORDER — TECHNETIUM TC 99M TETROFOSMIN IV KIT
30.0000 | PACK | Freq: Once | INTRAVENOUS | Status: AC | PRN
  Administered 2022-04-14: 28.3 via INTRAVENOUS

## 2022-04-14 MED ORDER — TECHNETIUM TC 99M TETROFOSMIN IV KIT
10.0000 | PACK | Freq: Once | INTRAVENOUS | Status: AC | PRN
  Administered 2022-04-14: 9.5 via INTRAVENOUS

## 2022-04-14 MED ORDER — SODIUM CHLORIDE FLUSH 0.9 % IV SOLN
INTRAVENOUS | Status: AC
  Administered 2022-04-14: 10 mL via INTRAVENOUS
  Filled 2022-04-14: qty 10

## 2022-04-14 MED ORDER — REGADENOSON 0.4 MG/5ML IV SOLN
INTRAVENOUS | Status: AC
  Administered 2022-04-14: 0.4 mg via INTRAVENOUS
  Filled 2022-04-14: qty 5

## 2022-04-18 ENCOUNTER — Other Ambulatory Visit: Payer: Self-pay

## 2022-04-18 ENCOUNTER — Telehealth: Payer: Self-pay | Admitting: Internal Medicine

## 2022-04-18 DIAGNOSIS — Z01818 Encounter for other preprocedural examination: Secondary | ICD-10-CM

## 2022-04-18 DIAGNOSIS — R9439 Abnormal result of other cardiovascular function study: Secondary | ICD-10-CM

## 2022-04-18 NOTE — Telephone Encounter (Signed)
Patient returned CMA's call regarding results. 

## 2022-04-21 ENCOUNTER — Encounter (HOSPITAL_COMMUNITY): Payer: Self-pay

## 2022-04-21 ENCOUNTER — Telehealth (HOSPITAL_COMMUNITY): Payer: Self-pay | Admitting: *Deleted

## 2022-04-21 MED ORDER — METOPROLOL TARTRATE 100 MG PO TABS: ORAL_TABLET | ORAL | 0 refills | Status: DC

## 2022-04-21 NOTE — Telephone Encounter (Signed)
Patient notified and verbalized understanding. Patient had no questions or concerns at this time.  

## 2022-04-21 NOTE — Telephone Encounter (Signed)
-----   Message from Chalmers Guest, MD sent at 04/18/2022  3:53 PM EDT ----- Stress test is abnormal. It shows there is a small area in the heart not getting adequate blood supply due to blocked artery. This could be real or could be a false alarm. To investigate further, she will benefit from CTA cardiac (as patient has metastatic endometrial cancer on chemotherapy) and LHC will be deferred until absolutely necessary. Obtain CTA cardiac.

## 2022-04-21 NOTE — Telephone Encounter (Signed)
Reaching out to patient to offer assistance regarding upcoming cardiac imaging study; pt verbalizes understanding of appt date/time, parking situation and where to check in, pre-test NPO status and medications ordered, and verified current allergies; name and call back number provided for further questions should they arise  Gordy Clement RN Navigator Cardiac Imaging [facility] [phone] office [phone] cell  Patient to hold her amlodipine and take 100mg  metoprolol tartrate two hours prior to her cardiac CT scan. She is aware to arrive at 11:30am.

## 2022-04-22 ENCOUNTER — Ambulatory Visit (HOSPITAL_COMMUNITY)
Admission: RE | Admit: 2022-04-22 | Discharge: 2022-04-22 | Disposition: A | Discharge status: Home / Self Care | Payer: 59 | Source: Ambulatory Visit | Attending: Internal Medicine | Admitting: Internal Medicine

## 2022-04-22 ENCOUNTER — Other Ambulatory Visit (HOSPITAL_COMMUNITY)
Admission: RE | Admit: 2022-04-22 | Discharge: 2022-04-22 | Disposition: A | Discharge status: Home / Self Care | Payer: 59 | Source: Ambulatory Visit | Attending: Internal Medicine | Admitting: Internal Medicine

## 2022-04-22 DIAGNOSIS — R9439 Abnormal result of other cardiovascular function study: Secondary | ICD-10-CM | POA: Diagnosis present

## 2022-04-22 DIAGNOSIS — Z01818 Encounter for other preprocedural examination: Secondary | ICD-10-CM | POA: Insufficient documentation

## 2022-04-22 DIAGNOSIS — I2510 Atherosclerotic heart disease of native coronary artery without angina pectoris: Secondary | ICD-10-CM | POA: Diagnosis not present

## 2022-04-22 DIAGNOSIS — R9430 Abnormal result of cardiovascular function study, unspecified: Secondary | ICD-10-CM

## 2022-04-22 LAB — BASIC METABOLIC PANEL
Anion gap: 9 (ref 5–15)
BUN: 14 mg/dL (ref 6–20)
CO2: 25 mmol/L (ref 22–32)
Calcium: 9.1 mg/dL (ref 8.9–10.3)
Chloride: 105 mmol/L (ref 98–111)
Creatinine, Ser: 0.58 mg/dL (ref 0.44–1.00)
GFR, Estimated: >60 mL/min (ref >60)
Glucose, Bld: 119 mg/dL — ABNORMAL HIGH (ref 70–99)
Potassium: 3.8 mmol/L (ref 3.5–5.1)
Sodium: 139 mmol/L (ref 135–145)

## 2022-04-22 MED ORDER — NITROGLYCERIN 0.4 MG SL SUBL: 0.8000 mg | SUBLINGUAL_TABLET | Freq: Once | SUBLINGUAL | Status: AC

## 2022-04-22 MED ORDER — NITROGLYCERIN 0.4 MG SL SUBL
SUBLINGUAL_TABLET | SUBLINGUAL | Status: AC
  Administered 2022-04-22: 0.8 mg via SUBLINGUAL
  Filled 2022-04-22: qty 2

## 2022-04-22 MED ORDER — IOHEXOL 350 MG/ML SOLN
100.0000 mL | Freq: Once | INTRAVENOUS | Status: AC | PRN
  Administered 2022-04-22: 100 mL via INTRAVENOUS

## 2022-04-28 ENCOUNTER — Other Ambulatory Visit: Payer: Self-pay | Admitting: *Deleted

## 2022-04-28 DIAGNOSIS — C541 Malignant neoplasm of endometrium: Secondary | ICD-10-CM

## 2022-04-28 MED ORDER — OXYCODONE-ACETAMINOPHEN 5-325 MG PO TABS: 1.0000 | ORAL_TABLET | Freq: Two times a day (BID) | ORAL | 0 refills | Status: DC | PRN

## 2022-05-01 ENCOUNTER — Other Ambulatory Visit: Payer: Self-pay | Admitting: Hematology

## 2022-05-02 ENCOUNTER — Telehealth: Payer: Self-pay | Admitting: Internal Medicine

## 2022-05-02 NOTE — Telephone Encounter (Signed)
Pt calling for CT results. Please advise. 

## 2022-05-02 NOTE — Telephone Encounter (Signed)
Patient needs results explained from her recent cardiac CT.

## 2022-05-06 NOTE — Telephone Encounter (Signed)
Left a message for patient to call office back regarding results

## 2022-05-06 NOTE — Telephone Encounter (Signed)
-----   Message from Marjo Bicker, MD sent at 05/05/2022  4:52 PM EDT ----- CT chest reported increased thickness of thymus in the anterior part of the chest which will need to be evaluated by her oncologist.  The CT chest report recommended to consider MRI chest with contrast for further evaluation.  Will defer this to her oncologist.

## 2022-05-06 NOTE — Telephone Encounter (Signed)
-----   Message from Marjo Bicker, MD sent at 05/05/2022  4:51 PM EDT ----- Coronary artery calcium score is 65 (more than 400 is considered to be significant). There is a small calcified plaque in 2 heart vessels, minimal coronary artery disease. I would continue atorvastatin 20 mg nightly with LDL goal<70 due to presence of plaque. This plaque should not cause the chest heaviness symptoms. If patient still has chest heaviness, will add a trial of metoprolol tartrate 25 mg twice daily and reevaluate her symptoms.

## 2022-05-06 NOTE — Telephone Encounter (Signed)
Patient notified and verbalized understanding. Patient had no questions or concerns at this time.  

## 2022-05-26 ENCOUNTER — Other Ambulatory Visit: Payer: 59

## 2022-05-26 ENCOUNTER — Inpatient Hospital Stay: Payer: 59 | Attending: Hematology

## 2022-05-26 ENCOUNTER — Ambulatory Visit (HOSPITAL_COMMUNITY)
Admission: RE | Admit: 2022-05-26 | Discharge: 2022-05-26 | Disposition: A | Discharge status: Home / Self Care | Payer: 59 | Source: Ambulatory Visit | Attending: Hematology | Admitting: Hematology

## 2022-05-26 VITALS — BP 125/68 | HR 96 | Temp 98.6°F | Resp 20

## 2022-05-26 DIAGNOSIS — C786 Secondary malignant neoplasm of retroperitoneum and peritoneum: Secondary | ICD-10-CM | POA: Insufficient documentation

## 2022-05-26 DIAGNOSIS — Z9079 Acquired absence of other genital organ(s): Secondary | ICD-10-CM | POA: Insufficient documentation

## 2022-05-26 DIAGNOSIS — G629 Polyneuropathy, unspecified: Secondary | ICD-10-CM | POA: Insufficient documentation

## 2022-05-26 DIAGNOSIS — Z803 Family history of malignant neoplasm of breast: Secondary | ICD-10-CM | POA: Insufficient documentation

## 2022-05-26 DIAGNOSIS — I10 Essential (primary) hypertension: Secondary | ICD-10-CM | POA: Insufficient documentation

## 2022-05-26 DIAGNOSIS — M545 Low back pain, unspecified: Secondary | ICD-10-CM | POA: Insufficient documentation

## 2022-05-26 DIAGNOSIS — Z95828 Presence of other vascular implants and grafts: Secondary | ICD-10-CM

## 2022-05-26 DIAGNOSIS — Z8041 Family history of malignant neoplasm of ovary: Secondary | ICD-10-CM | POA: Insufficient documentation

## 2022-05-26 DIAGNOSIS — Z801 Family history of malignant neoplasm of trachea, bronchus and lung: Secondary | ICD-10-CM | POA: Insufficient documentation

## 2022-05-26 DIAGNOSIS — R928 Other abnormal and inconclusive findings on diagnostic imaging of breast: Secondary | ICD-10-CM | POA: Insufficient documentation

## 2022-05-26 DIAGNOSIS — Z90722 Acquired absence of ovaries, bilateral: Secondary | ICD-10-CM | POA: Insufficient documentation

## 2022-05-26 DIAGNOSIS — C541 Malignant neoplasm of endometrium: Secondary | ICD-10-CM | POA: Insufficient documentation

## 2022-05-26 DIAGNOSIS — Z90710 Acquired absence of both cervix and uterus: Secondary | ICD-10-CM | POA: Insufficient documentation

## 2022-05-26 DIAGNOSIS — Z79899 Other long term (current) drug therapy: Secondary | ICD-10-CM | POA: Insufficient documentation

## 2022-05-26 LAB — CBC WITH DIFFERENTIAL/PLATELET
Abs Immature Granulocytes: 0.02 10*3/uL (ref 0.00–0.07)
Basophils Absolute: 0.1 10*3/uL (ref 0.0–0.1)
Basophils Relative: 1 %
Eosinophils Absolute: 0.2 10*3/uL (ref 0.0–0.5)
Eosinophils Relative: 2 %
HCT: 39.6 % (ref 36.0–46.0)
Hemoglobin: 12.9 g/dL (ref 12.0–15.0)
Immature Granulocytes: 0 %
Lymphocytes Relative: 38 %
Lymphs Abs: 3.7 10*3/uL (ref 0.7–4.0)
MCH: 26.5 pg (ref 26.0–34.0)
MCHC: 32.6 g/dL (ref 30.0–36.0)
MCV: 81.3 fL (ref 80.0–100.0)
Monocytes Absolute: 0.8 10*3/uL (ref 0.1–1.0)
Monocytes Relative: 8 %
Neutro Abs: 5 10*3/uL (ref 1.7–7.7)
Neutrophils Relative %: 51 %
Platelets: 336 10*3/uL (ref 150–400)
RBC: 4.87 MIL/uL (ref 3.87–5.11)
RDW: 16.9 % — ABNORMAL HIGH (ref 11.5–15.5)
WBC: 9.9 10*3/uL (ref 4.0–10.5)
nRBC: 0 % (ref 0.0–0.2)

## 2022-05-26 LAB — COMPREHENSIVE METABOLIC PANEL
ALT: 28 U/L (ref 0–44)
AST: 26 U/L (ref 15–41)
Albumin: 3.9 g/dL (ref 3.5–5.0)
Alkaline Phosphatase: 131 U/L — ABNORMAL HIGH (ref 38–126)
Anion gap: 10 (ref 5–15)
BUN: 15 mg/dL (ref 6–20)
CO2: 23 mmol/L (ref 22–32)
Calcium: 9.1 mg/dL (ref 8.9–10.3)
Chloride: 106 mmol/L (ref 98–111)
Creatinine, Ser: 0.62 mg/dL (ref 0.44–1.00)
GFR, Estimated: >60 mL/min (ref >60)
Glucose, Bld: 72 mg/dL (ref 70–99)
Potassium: 3.9 mmol/L (ref 3.5–5.1)
Sodium: 139 mmol/L (ref 135–145)
Total Bilirubin: 0.3 mg/dL (ref 0.3–1.2)
Total Protein: 7.4 g/dL (ref 6.5–8.1)

## 2022-05-26 LAB — MAGNESIUM: Magnesium: 1.8 mg/dL (ref 1.7–2.4)

## 2022-05-26 MED ORDER — HEPARIN SOD (PORK) LOCK FLUSH 100 UNIT/ML IV SOLN: 500.0000 [IU] | Freq: Once | INTRAVENOUS | Status: DC

## 2022-05-26 MED ORDER — IOHEXOL 300 MG/ML  SOLN
100.0000 mL | Freq: Once | INTRAMUSCULAR | Status: AC | PRN
  Administered 2022-05-26: 100 mL via INTRAVENOUS

## 2022-05-26 MED ORDER — SODIUM CHLORIDE 0.9% FLUSH
10.0000 mL | Freq: Once | INTRAVENOUS | Status: AC
  Administered 2022-05-26: 10 mL via INTRAVENOUS

## 2022-05-26 MED ORDER — HEPARIN SOD (PORK) LOCK FLUSH 100 UNIT/ML IV SOLN
INTRAVENOUS | Status: AC
  Filled 2022-05-26: qty 5

## 2022-05-26 NOTE — Progress Notes (Signed)
Port flushed with good blood return noted. No bruising or swelling at site. Patient left accessed for CT scan and patient discharged in satisfactory condition. VVS stable with no signs or symptoms of distressed noted. 

## 2022-05-26 NOTE — Patient Instructions (Signed)
[facility]  Discharge Instructions: Thank you for choosing [facility] to provide your oncology and hematology care.  If you have a lab appointment with the Cancer Center - please note that after April 8th, 2024, all labs will be drawn in the cancer center.  You do not have to check in or register with the main entrance as you have in the past but will complete your check-in in the cancer center.  Wear comfortable clothing and clothing appropriate for easy access to any Portacath or PICC line.   We strive to give you quality time with your provider. You may need to reschedule your appointment if you arrive late (15 or more minutes).  Arriving late affects you and other patients whose appointments are after yours.  Also, if you miss three or more appointments without notifying the office, you may be dismissed from the clinic at the provider's discretion.      For prescription refill requests, have your pharmacy contact our office and allow 72 hours for refills to be completed.    Today you received the following Port accessed for CT and labs drawn, return as scheduled.   To help prevent nausea and vomiting after your treatment, we encourage you to take your nausea medication as directed.  BELOW ARE SYMPTOMS THAT SHOULD BE REPORTED IMMEDIATELY: *FEVER GREATER THAN 100.4 F (38 C) OR HIGHER *CHILLS OR SWEATING *NAUSEA AND VOMITING THAT IS NOT CONTROLLED WITH YOUR NAUSEA MEDICATION *UNUSUAL SHORTNESS OF BREATH *UNUSUAL BRUISING OR BLEEDING *URINARY PROBLEMS (pain or burning when urinating, or frequent urination) *BOWEL PROBLEMS (unusual diarrhea, constipation, pain near the anus) TENDERNESS IN MOUTH AND THROAT WITH OR WITHOUT PRESENCE OF ULCERS (sore throat, sores in mouth, or a toothache) UNUSUAL RASH, SWELLING OR PAIN  UNUSUAL VAGINAL DISCHARGE OR ITCHING   Items with * indicate a potential emergency and should be followed up as soon as possible or go to  the Emergency Department if any problems should occur.  Please show the CHEMOTHERAPY ALERT CARD or IMMUNOTHERAPY ALERT CARD at check-in to the Emergency Department and triage nurse.  Should you have questions after your visit or need to cancel or reschedule your appointment, please contact [facility] [phone]  and follow the prompts.  Office hours are 8:00 a.m. to 4:30 p.m. Monday - Friday. Please note that voicemails left after 4:00 p.m. may not be returned until the following business day.  We are closed weekends and major holidays. You have access to a nurse at all times for urgent questions. Please call the main number to the clinic [phone] and follow the prompts.  For any non-urgent questions, you may also contact your provider using MyChart. We now offer e-Visits for anyone 43 and older to request care online for non-urgent symptoms. For details visit [email or web address].   Also download the MyChart app! Go to the app store, search "MyChart", open the app, select [facility], and log in with your MyChart username and password.

## 2022-05-28 ENCOUNTER — Other Ambulatory Visit: Payer: Self-pay | Admitting: Hematology

## 2022-05-28 LAB — CA 125: Cancer Antigen (CA) 125: 8 U/mL (ref 0.0–38.1)

## 2022-06-02 ENCOUNTER — Inpatient Hospital Stay (HOSPITAL_BASED_OUTPATIENT_CLINIC_OR_DEPARTMENT_OTHER): Payer: 59 | Admitting: Hematology

## 2022-06-02 VITALS — BP 132/67 | HR 80 | Temp 97.4°F | Resp 18 | Ht 64.0 in | Wt 225.0 lb

## 2022-06-02 DIAGNOSIS — Z801 Family history of malignant neoplasm of trachea, bronchus and lung: Secondary | ICD-10-CM | POA: Diagnosis not present

## 2022-06-02 DIAGNOSIS — Z90722 Acquired absence of ovaries, bilateral: Secondary | ICD-10-CM | POA: Diagnosis not present

## 2022-06-02 DIAGNOSIS — M545 Low back pain, unspecified: Secondary | ICD-10-CM | POA: Diagnosis not present

## 2022-06-02 DIAGNOSIS — G629 Polyneuropathy, unspecified: Secondary | ICD-10-CM | POA: Diagnosis not present

## 2022-06-02 DIAGNOSIS — C541 Malignant neoplasm of endometrium: Secondary | ICD-10-CM | POA: Diagnosis not present

## 2022-06-02 DIAGNOSIS — Z8041 Family history of malignant neoplasm of ovary: Secondary | ICD-10-CM | POA: Diagnosis not present

## 2022-06-02 DIAGNOSIS — Z79899 Other long term (current) drug therapy: Secondary | ICD-10-CM | POA: Diagnosis not present

## 2022-06-02 DIAGNOSIS — Z90710 Acquired absence of both cervix and uterus: Secondary | ICD-10-CM | POA: Diagnosis not present

## 2022-06-02 DIAGNOSIS — Z9079 Acquired absence of other genital organ(s): Secondary | ICD-10-CM | POA: Diagnosis not present

## 2022-06-02 DIAGNOSIS — Z803 Family history of malignant neoplasm of breast: Secondary | ICD-10-CM | POA: Diagnosis not present

## 2022-06-02 DIAGNOSIS — C786 Secondary malignant neoplasm of retroperitoneum and peritoneum: Secondary | ICD-10-CM | POA: Diagnosis present

## 2022-06-02 DIAGNOSIS — I10 Essential (primary) hypertension: Secondary | ICD-10-CM | POA: Diagnosis not present

## 2022-06-02 DIAGNOSIS — R928 Other abnormal and inconclusive findings on diagnostic imaging of breast: Secondary | ICD-10-CM | POA: Diagnosis not present

## 2022-06-02 NOTE — Progress Notes (Signed)
[facility] [address] [place] [address]    Clinic Day:  06/02/2022  Referring physician: Billie Lade, MD  Patient Care Team: Billie Lade, MD as PCP - General (Internal Medicine) Mallipeddi, Orion Modest, MD as PCP - Cardiology (Cardiology) Mickie Bail, RN as Oncology Nurse Navigator Doreatha Massed, MD as Medical Oncologist (Oncology)   ASSESSMENT & PLAN:   Assessment: 1.  Recurrent endometrial carcinosarcoma: -TAH, BSO, bilateral pelvic and para-aortic lymph node resections on 04/13/2018. -Pathology showed carcinosarcoma (malignant mixed mullerian tumor) is arising in an endometrial type polyp with no myometrial invasion identified.  High-grade.  0/39 lymph nodes positive.  PT1APN0, FIGO stage Ia.  MMR normal.  MSI-stable. -CTAP on 12/09/2018 for abdominal pain showed extensive peritoneal carcinomatosis and large soft tissue mass in the pelvis. -Biopsy of the omental mass on 12/28/2018 shows poorly differentiated carcinoma consistent with her prior malignancy. -Carboplatin and paclitaxel started on 12/29/2018. -CT scan on 05/02/2019 showed peritoneal implants in the low central small bowel mesentery and left pelvic sidewall have decreased in size measuring 1.5 x 1.9 cm and 1.7 x 2.2 cm.  Soft tissue nodule in the left lateral omentum measures 1 x 1.7 cm with no evidence of metastatic disease. -Continuation of chemotherapy until complete response was recommended. -CT AP on 07/04/2019 showed continued positive response to therapy with scattered peritoneal metastasis in the pelvis and left omentum decreased in size.  No new metastatic disease. -CTAP on 10/24/2019 after 14 cycles showed substantial reduction in size of soft tissue nodules in the pelvis.  1.4 x 0.9 x 1 cm soft tissue density in the inferior serosal margin of the sigmoid colon, previously 1.7 x 1.1 x 1.7 cm.  2 small soft tissue nodules in the mesenteric adipose tissue above the urinary  bladder measures 0.8 cm. -She has developed serious allergic reaction during cycle 14 with carboplatin. -I have discussed with Dr. Andrey Farmer.  Other options include adding ifosfamide to Taxol or Doxil and combination of lenvatinib with pembrolizumab.  In the lenvatinib pembrolizumab trial carcinosarcomas were not included. -Single agent paclitaxel started on 11/09/2019, dose reduced by 20% on 01/18/2020.  Paclitaxel held on 08/22/2021 as a chemo holiday. -CTAP on 01/10/2020 with stable small peritoneal soft tissue nodules in the pelvis.  Resolution of soft tissue nodularity in the left abdominal omental fat with no new or progressive disease.    Plan: 1.  Recurrent endometrial carcinosarcoma: - Denies any abdominal pain.  Has loin pains occasionally. - Reviewed labs from 05/26/2022: CA125 was normal.  LFTs show elevated alk phos of 131.  Rest of the CMP was normal. - CTAP on 05/26/2022: Stable small pelvic nodes in the mesenteric nodules in the pelvis with no new lesions. - She has not been receiving Taxol since 08/22/2021. - She has been doing very well.  I have recommended follow-up in 4 months with repeat CTAP with contrast and CA125.  After that I will switch her to every 56-month CT scan and every 3-month visit with tumor marker.   2.  Low back pain/right-sided abdominal pain: - Right hip pain more than left hip pain.  Continue oxycodone as needed.   3.  Hypertension: - Continue Norvasc 5 mg daily.  Blood pressure is stable.   4.  Neuropathy in the legs: - Continue Lyrica 75 mg 3 times daily.   5.  Abnormal mammogram: - Last mammogram was 06/19/2021, BI-RADS Category 1.  She will have another mammogram this year.  6.  Hypomagnesemia: - Continue magnesium 1 tablet daily.  Magnesium is normal.   Orders Placed This Encounter  Procedures   CT Abdomen Pelvis W Contrast    Standing Status:   Future    Standing Expiration Date:   06/02/2023    Order Specific Question:   If indicated for the  ordered procedure, I authorize the administration of contrast media per Radiology protocol    Answer:   Yes    Order Specific Question:   Does the patient have a contrast media/X-ray dye allergy?    Answer:   No    Order Specific Question:   Is patient pregnant?    Answer:   No    Order Specific Question:   Preferred imaging location?    Answer:   [facility]    Order Specific Question:   Release to patient    Answer:   Immediate [1]    Order Specific Question:   If indicated for the ordered procedure, I authorize the administration of oral contrast media per Radiology protocol    Answer:   Yes   CBC with Differential    Standing Status:   Future    Standing Expiration Date:   06/02/2023   Comprehensive metabolic panel    Standing Status:   Future    Standing Expiration Date:   06/02/2023   CA 125    Standing Status:   Future    Standing Expiration Date:   06/02/2023      I,Katie Daubenspeck,acting as a scribe for Doreatha Massed, MD.,have documented all relevant documentation on the behalf of Doreatha Massed, MD,as directed by  Doreatha Massed, MD while in the presence of Doreatha Massed, MD.   I, Doreatha Massed MD, have reviewed the above documentation for accuracy and completeness, and I agree with the above.   Doreatha Massed, MD   5/13/20245:24 PM  CHIEF COMPLAINT:   Diagnosis: recurrent endometrial cancer    Cancer Staging  No matching staging information was found for the patient.   Prior Therapy: 1. TAH & BSO, LNDs on 04/13/2018 2. Carboplatin/Paclitaxel, 12/29/18 - 10/05/19 3. Maintenance Paclitaxel, last dose on 08/22/2021  Current Therapy:  surveillance   HISTORY OF PRESENT ILLNESS:   Oncology History  Endometrial cancer (HCC)  03/31/2018 Initial Diagnosis   Endometrial cancer (HCC)   12/29/2018 -  Chemotherapy   Patient is on Treatment Plan : UTERINE Carboplatin AUC 6 / Paclitaxel q21d     06/06/2020 Miscellaneous   Summary  of oncologic history from Dr. Ellin Saba   1.  Recurrent endometrial carcinosarcoma: -TAH, BSO, bilateral pelvic and para-aortic lymph node resections on 04/13/2018. -Pathology showed carcinosarcoma (malignant mixed mullerian tumor) is arising in an endometrial type polyp with no myometrial invasion identified.  High-grade.  0/39 lymph nodes positive.  PT1APN0, FIGO stage Ia.  MMR normal.  MSI-stable. -CTAP on 12/09/2018 for abdominal pain showed extensive peritoneal carcinomatosis and large soft tissue mass in the pelvis. -Biopsy of the omental mass on 12/28/2018 shows poorly differentiated carcinoma consistent with her prior malignancy. -Carboplatin and paclitaxel started on 12/29/2018. -CT scan on 05/02/2019 showed peritoneal implants in the low central small bowel mesentery and left pelvic sidewall have decreased in size measuring 1.5 x 1.9 cm and 1.7 x 2.2 cm.  Soft tissue nodule in the left lateral omentum measures 1 x 1.7 cm with no evidence of metastatic disease. -Continuation of chemotherapy until complete response was recommended. -CT AP on 07/04/2019 showed continued positive response to therapy with scattered  peritoneal metastasis in the pelvis and left omentum decreased in size.  No new metastatic disease. -CTAP on 10/24/2019 after 14 cycles showed substantial reduction in size of soft tissue nodules in the pelvis.  1.4 x 0.9 x 1 cm soft tissue density in the inferior serosal margin of the sigmoid colon, previously 1.7 x 1.1 x 1.7 cm.  2 small soft tissue nodules in the mesenteric adipose tissue above the urinary bladder measures 0.8 cm. -She has developed serious allergic reaction during cycle 14 with carboplatin. -I have discussed with Dr. Andrey Farmer.  Other options include adding ifosfamide to Taxol or Doxil and combination of lenvatinib with pembrolizumab.  In the lenvatinib pembrolizumab trial carcinosarcomas were not included. -Single agent paclitaxel started on 11/09/2019, dose reduced by 20%  on 01/18/2020. -CTAP on 01/10/2020 with stable small peritoneal soft tissue nodules in the pelvis.  Resolution of soft tissue nodularity in the left abdominal omental fat with no new or progressive disease.     06/20/2020 Imaging   1. Stable examination status post hysterectomy without suspicious enhancing soft tissue nodularity at the vaginal cuff and no significant change in the multiple small soft tissue nodules in the pelvis likely representing treated tumor. No new or progressive findings. 2. Cholelithiasis including a large gallstone in the neck of the gallbladder but without CT evidence of acute cholecystitis. 3. Small hiatal hernia. 4. Aortic atherosclerosis.        INTERVAL HISTORY:   Jessica [name] is a 62 y.o. female presenting to clinic today for follow up of recurrent endometrial cancer. She was last seen by me on 03/04/22.  Since her last visit, she underwent surveillance CT A/P on 05/26/22 showing: no significant interval change; stable small pelvic node in mesenteric nodules in pelvis; no new mass lesion, fluid collection, or lymph node enlargement.  Today, she states that she is doing well overall. Her appetite level is at 100%. Her energy level is at 25%.  PAST MEDICAL HISTORY:   Past Medical History: Past Medical History:  Diagnosis Date   Allergy    Cancer (HCC)    Phreesia 09/05/2019   Cancer of skin of back    Elevated blood pressure reading    Endometrial cancer (HCC)    Hyperlipidemia    Hypertension    PMB (postmenopausal bleeding)    Port-A-Cath in place 12/24/2018    Surgical History: Past Surgical History:  Procedure Laterality Date   ABDOMINAL HYSTERECTOMY N/A    Phreesia 09/05/2019   IR IMAGING GUIDED PORT INSERTION  12/28/2018   IR RADIOLOGIST EVAL & MGMT  06/02/2018   IR RADIOLOGIST EVAL & MGMT  06/16/2018   NECK SURGERY  2000   spinal surgery , titanium rod in place    ROBOTIC ASSISTED TOTAL HYSTERECTOMY WITH BILATERAL SALPINGO OOPHERECTOMY N/A  04/13/2018   Procedure: XI ROBOTIC ASSISTED TOTAL HYSTERECTOMY WITH BILATERAL SALPINGO OOPHORECTOMY;  Surgeon: Adolphus Birchwood, MD;  Location: [facility];  Service: Gynecology;  Laterality: N/A;   ROBOTIC PELVIC AND PARA-AORTIC LYMPH NODE DISSECTION N/A 04/13/2018   Procedure: XI ROBOTIC PELVIC LYMPHADECTOMY AND PARA-AORTIC LYMPH NODE DISSECTION;  Surgeon: Adolphus Birchwood, MD;  Location: [facility];  Service: Gynecology;  Laterality: N/A;    Social History: Social History   Socioeconomic History   Marital status: Widowed    Spouse name: Not on file   Number of children: 3   Years of education: Not on file   Highest education level: Not on file  Occupational History   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  Tobacco Use   Smoking status: Never   Smokeless tobacco: Never  Vaping Use   Vaping Use: Never used  Substance and Sexual Activity   Alcohol use: Yes    Alcohol/week: 4.0 standard drinks of alcohol    Types: 4 Glasses of wine per week    Comment: weekends   Drug use: Never   Sexual activity: Not Currently  Other Topics Concern   Not on file  Social History Narrative      Wears seat belt    Does not use phone while driving    Smoke detectors at home   Fire extinguisher-no   No weapons   Social Determinants of Health   Financial Resource Strain: Low Risk  (02/27/2022)   Overall Financial Resource Strain (CARDIA)    Difficulty of Paying Living Expenses: Not hard at all  Food Insecurity: No Food Insecurity (02/27/2022)   Hunger Vital Sign    Worried About Running Out of Food in the Last Year: Never true    Ran Out of Food in the Last Year: Never true  Transportation Needs: No Transportation Needs (02/27/2022)   PRAPARE - Administrator, Civil Service (Medical): No    Lack of Transportation (Non-Medical): No  Physical Activity: Sufficiently Active (02/27/2022)   Exercise Vital Sign    Days of Exercise per Week: 7 days    Minutes of Exercise per Session: 60 min  Stress:  Stress Concern Present (02/27/2022)   [employer] of Occupational Health - Occupational Stress Questionnaire    Feeling of Stress : Rather much  Social Connections: Socially Isolated (02/27/2022)   Social Connection and Isolation Panel [NHANES]    Frequency of Communication with Friends and Family: More than three times a week    Frequency of Social Gatherings with Friends and Family: More than three times a week    Attends Religious Services: Never    Database administrator or Organizations: No    Attends Banker Meetings: Never    Marital Status: Widowed  Intimate Partner Violence: Not At Risk (02/27/2022)   Humiliation, Afraid, Rape, and Kick questionnaire    Fear of Current or Ex-Partner: No    Emotionally Abused: No    Physically Abused: No    Sexually Abused: No    Family History: Family History  Problem Relation Age of Onset   Hypertension Mother    Breast cancer Mother    Hypertension Father    Heart disease Father    Cancer Father        lung   Cancer Sister        ovarian cancer   Melanoma Sister     Current Medications:  Current Outpatient Medications:    amLODipine (NORVASC) 5 MG tablet, Take 1 tablet (5 mg total) by mouth daily., Disp: 90 tablet, Rfl: 1   atorvastatin (LIPITOR) 20 MG tablet, Take 1 tablet (20 mg total) by mouth daily., Disp: 90 tablet, Rfl: 1   Cholecalciferol (VITAMIN D3) 25 MCG (1000 UT) CAPS, Take 1 capsule (1,000 Units total) by mouth daily., Disp: 60 capsule, Rfl: 2   EPINEPHrine 0.3 mg/0.3 mL IJ SOAJ injection, Inject 0.3 mg into the muscle as needed for anaphylaxis., Disp: 1 each, Rfl: 0   lidocaine-prilocaine (EMLA) cream, Apply to port site prior to use, Disp: 30 g, Rfl: 0   MAGNESIUM-OXIDE 400 (240 Mg) MG tablet, Take 1 tablet by mouth twice daily, Disp: 60 tablet, Rfl: 0  metoprolol tartrate (LOPRESSOR) 100 MG tablet, Take tablet TWO hours prior to your cardiac CT scan., Disp: 1 tablet, Rfl: 0   nitroGLYCERIN  (NITROSTAT) 0.4 MG SL tablet, Place 1 tablet (0.4 mg total) under the tongue every 5 (five) minutes as needed for chest pain., Disp: 25 tablet, Rfl: 3   oxyCODONE-acetaminophen (PERCOCET) 5-325 MG tablet, Take 1 tablet by mouth every 12 (twelve) hours as needed (pain)., Disp: 60 tablet, Rfl: 0   PACLITAXEL IV, Inject into the vein every 21 ( twenty-one) days., Disp: , Rfl:    pregabalin (LYRICA) 75 MG capsule, TAKE 1 CAPSULE BY MOUTH THREE TIMES DAILY, Disp: 90 capsule, Rfl: 0   prochlorperazine (COMPAZINE) 10 MG tablet, Take 1 tablet (10 mg total) by mouth every 6 (six) hours as needed (Nausea or vomiting)., Disp: 60 tablet, Rfl: 3   sertraline (ZOLOFT) 100 MG tablet, Take 1 tablet (100 mg total) by mouth daily., Disp: 90 tablet, Rfl: 1 No current facility-administered medications for this visit.  Facility-Administered Medications Ordered in Other Visits:    diphenhydrAMINE (BENADRYL) 50 MG/ML injection, , , ,    palonosetron (ALOXI) 0.25 MG/5ML injection, , , ,    Allergies: Allergies  Allergen Reactions   Carboplatin Other (See Comments)    "body on fire and abdominal pain"   Nickel Rash    itchy    REVIEW OF SYSTEMS:   Review of Systems  Constitutional:  Negative for chills, fatigue and fever.  HENT:   Negative for lump/mass, mouth sores, nosebleeds, sore throat and trouble swallowing.   Eyes:  Negative for eye problems.  Respiratory:  Negative for cough and shortness of breath.   Cardiovascular:  Negative for chest pain, leg swelling and palpitations.  Gastrointestinal:  Negative for abdominal pain, constipation, diarrhea, nausea and vomiting.  Genitourinary:  Negative for bladder incontinence, difficulty urinating, dysuria, frequency, hematuria and nocturia.   Musculoskeletal:  Positive for arthralgias. Negative for back pain, flank pain, myalgias and neck pain.  Skin:  Negative for itching and rash.  Neurological:  Positive for dizziness, headaches and numbness.   Hematological:  Does not bruise/bleed easily.  Psychiatric/Behavioral:  Negative for depression, sleep disturbance and suicidal ideas. The patient is not nervous/anxious.   All other systems reviewed and are negative.    VITALS:   Blood pressure 132/67, pulse 80, temperature (!) 97.4 F (36.3 C), temperature source Tympanic, resp. rate 18, height 5\' 4"  (1.626 m), weight 225 lb (102.1 kg), SpO2 95 %.  Wt Readings from Last 3 Encounters:  06/02/22 225 lb (102.1 kg)  04/10/22 222 lb 3.2 oz (100.8 kg)  03/04/22 217 lb 6.4 oz (98.6 kg)    Body mass index is 38.62 kg/m.  Performance status (ECOG): 1 - Symptomatic but completely ambulatory  PHYSICAL EXAM:   Physical Exam Vitals and nursing note reviewed. Exam conducted with a chaperone present.  Constitutional:      Appearance: Normal appearance.  Cardiovascular:     Rate and Rhythm: Normal rate and regular rhythm.     Pulses: Normal pulses.     Heart sounds: Normal heart sounds.  Pulmonary:     Effort: Pulmonary effort is normal.     Breath sounds: Normal breath sounds.  Abdominal:     Palpations: Abdomen is soft. There is no hepatomegaly, splenomegaly or mass.     Tenderness: There is no abdominal tenderness.  Musculoskeletal:     Right lower leg: No edema.     Left lower leg: No edema.  Lymphadenopathy:  Cervical: No cervical adenopathy.     Right cervical: No superficial, deep or posterior cervical adenopathy.    Left cervical: No superficial, deep or posterior cervical adenopathy.     Upper Body:     Right upper body: No supraclavicular or axillary adenopathy.     Left upper body: No supraclavicular or axillary adenopathy.  Neurological:     General: No focal deficit present.     Mental Status: She is alert and oriented to person, place, and time.  Psychiatric:        Mood and Affect: Mood normal.        Behavior: Behavior normal.     LABS:      Latest Ref Rng & Units 05/26/2022    2:46 PM 02/25/2022    8:18  AM 11/20/2021   10:09 AM  CBC  WBC 4.0 - 10.5 K/uL 9.9  8.1  7.9   Hemoglobin 12.0 - 15.0 g/dL 16.1  09.6  04.5   Hematocrit 36.0 - 46.0 % 39.6  40.4  36.1   Platelets 150 - 400 K/uL 336  310  315       Latest Ref Rng & Units 05/26/2022    2:46 PM 04/22/2022    8:43 AM 02/25/2022    8:18 AM  CMP  Glucose 70 - 99 mg/dL 72  409  811   BUN 6 - 20 mg/dL 15  14  21    Creatinine 0.44 - 1.00 mg/dL 9.14  7.82  9.56   Sodium 135 - 145 mmol/L 139  139  138   Potassium 3.5 - 5.1 mmol/L 3.9  3.8  3.6   Chloride 98 - 111 mmol/L 106  105  105   CO2 22 - 32 mmol/L 23  25  24    Calcium 8.9 - 10.3 mg/dL 9.1  9.1  9.4   Total Protein 6.5 - 8.1 g/dL 7.4   7.2   Total Bilirubin 0.3 - 1.2 mg/dL 0.3   0.2   Alkaline Phos 38 - 126 U/L 131   116   AST 15 - 41 U/L 26   21   ALT 0 - 44 U/L 28   26      No results found for: "CEA1", "CEA" / No results found for: "CEA1", "CEA" No results found for: "PSA1" No results found for: "CAN199" Lab Results  Component Value Date   CAN125 8.0 05/26/2022    No results found for: "TOTALPROTELP", "ALBUMINELP", "A1GS", "A2GS", "BETS", "BETA2SER", "GAMS", "MSPIKE", "SPEI" No results found for: "TIBC", "FERRITIN", "IRONPCTSAT" Lab Results  Component Value Date   LDH 160 02/09/2019   LDH 331 (H) 12/22/2018     STUDIES:   CT Abdomen Pelvis W Contrast  Result Date: 05/29/2022 CLINICAL DATA:  Monitoring cervical cancer. On chemotherapy. * Tracking Code: BO * EXAM: CT ABDOMEN AND PELVIS WITH CONTRAST TECHNIQUE: Multidetector CT imaging of the abdomen and pelvis was performed using the standard protocol following bolus administration of intravenous contrast. RADIATION DOSE REDUCTION: This exam was performed according to the departmental dose-optimization program which includes automated exposure control, adjustment of the mA and/or kV according to patient size and/or use of iterative reconstruction technique. CONTRAST:  OMNIPAQUE IOHEXOL 300 MG/ML  SOLN COMPARISON:  CT  02/25/2022 and older FINDINGS: Lower chest: Mild linear opacity along the medial right lung base likely scar or atelectasis. No pleural effusion. Unchanged from previous. Small hiatal hernia. Hepatobiliary: Stone towards the neck of the gallbladder once again. The gallbladder is  distended. Please correlate with any symptoms. Patent portal vein. Preserved hepatic enhancement. No space-occupying liver lesion. Pancreas: Unremarkable. No pancreatic ductal dilatation or surrounding inflammatory changes. Spleen: Normal in size without focal abnormality. Adrenals/Urinary Tract: Adrenal glands are unremarkable. Kidneys are normal, without renal calculi, focal lesion, or hydronephrosis. Bladder is unremarkable. Somewhat low-lying appearance of the bladder. Please correlate for any history of prolapse. Stomach/Bowel: Oral contrast was administered. Small hiatal hernia. Normal appendix. Large bowel has a mild diffuse scattered colonic stool. Both small and large bowel are nondilated. Vascular/Lymphatic: Aortic atherosclerosis. No enlarged abdominal or pelvic lymph nodes. Previous left-sided pelvic sidewall node which measured 8 mm in longitudinal length, today on series 2, image 69 measures 9 x 6 mm. The adjacent small nodules in the low central pelvis on the prior which measured up to 6 mm, today are seen on series 2, image 69 as well with the largest measuring 6 mm. No interval change. Reproductive: Status post hysterectomy. No adnexal masses. Other: Stable tiny nodule right pericolic gutter on series 2, image 49 posterior to the colon. No free air or free fluid. Musculoskeletal: Scattered degenerative changes of the spine and pelvis. Transitional lumbosacral segment. IMPRESSION: No significant interval change. Stable small pelvic node in mesenteric nodules in the pelvis. No new mass lesion, fluid collection or lymph node enlargement. Gallstone. Hiatal hernia. Electronically Signed   By: Karen Kays M.D.   On: 05/29/2022  17:21

## 2022-06-02 NOTE — Patient Instructions (Signed)
[facility]  Discharge Instructions  You were seen and examined today by Dr. Ellin Saba.  Dr. Ellin Saba discussed your most recent lab work and CT scan which are stable.  Follow-up as scheduled.  Thank you for choosing [facility] to provide your oncology and hematology care.   To afford each patient quality time with our provider, please arrive at least 15 minutes before your scheduled appointment time. You may need to reschedule your appointment if you arrive late (10 or more minutes). Arriving late affects you and other patients whose appointments are after yours.  Also, if you miss three or more appointments without notifying the office, you may be dismissed from the clinic at the provider's discretion.    Again, thank you for choosing [facility].  Our hope is that these requests will decrease the amount of time that you wait before being seen by our physicians.   If you have a lab appointment with the Cancer Center - please note that after April 8th, all labs will be drawn in the cancer center.  You do not have to check in or register with the main entrance as you have in the past but will complete your check-in at the cancer center.            _____________________________________________________________  Should you have questions after your visit to [facility], please contact our office at [phone] and follow the prompts.  Our office hours are 8:00 a.m. to 4:30 p.m. Monday - Thursday and 8:00 a.m. to 2:30 p.m. Friday.  Please note that voicemails left after 4:00 p.m. may not be returned until the following business day.  We are closed weekends and all major holidays.  You do have access to a nurse 24-7, just call the main number to the clinic [phone] and do not press any options, hold on the line and a nurse will answer the phone.    For prescription refill requests, have your pharmacy contact  our office and allow 72 hours.    Masks are no longer required in the cancer centers. If you would like for your care team to wear a mask while they are taking care of you, please let them know. You may have one support person who is at least 60 years old accompany you for your appointments.

## 2022-06-03 ENCOUNTER — Other Ambulatory Visit: Payer: Self-pay

## 2022-06-03 ENCOUNTER — Other Ambulatory Visit: Payer: Self-pay | Admitting: Hematology

## 2022-06-19 ENCOUNTER — Ambulatory Visit: Payer: 59 | Admitting: Internal Medicine

## 2022-06-19 ENCOUNTER — Other Ambulatory Visit: Payer: Self-pay | Admitting: *Deleted

## 2022-06-19 DIAGNOSIS — C541 Malignant neoplasm of endometrium: Secondary | ICD-10-CM

## 2022-06-19 MED ORDER — OXYCODONE-ACETAMINOPHEN 5-325 MG PO TABS: 1.0000 | ORAL_TABLET | Freq: Two times a day (BID) | ORAL | 0 refills | Status: DC | PRN

## 2022-07-02 ENCOUNTER — Other Ambulatory Visit: Payer: Self-pay | Admitting: Hematology

## 2022-07-08 ENCOUNTER — Other Ambulatory Visit: Payer: Self-pay | Admitting: Internal Medicine

## 2022-07-08 DIAGNOSIS — I10 Essential (primary) hypertension: Secondary | ICD-10-CM

## 2022-07-08 DIAGNOSIS — E785 Hyperlipidemia, unspecified: Secondary | ICD-10-CM

## 2022-07-09 ENCOUNTER — Other Ambulatory Visit: Payer: Self-pay | Admitting: Nurse Practitioner

## 2022-07-09 ENCOUNTER — Encounter: Payer: Self-pay | Admitting: Internal Medicine

## 2022-07-09 ENCOUNTER — Ambulatory Visit (INDEPENDENT_AMBULATORY_CARE_PROVIDER_SITE_OTHER): Payer: 59 | Admitting: Internal Medicine

## 2022-07-09 ENCOUNTER — Ambulatory Visit (HOSPITAL_COMMUNITY)
Admission: RE | Admit: 2022-07-09 | Discharge: 2022-07-09 | Disposition: A | Discharge status: Home / Self Care | Payer: 59 | Source: Ambulatory Visit | Attending: Internal Medicine | Admitting: Internal Medicine

## 2022-07-09 VITALS — BP 122/77 | HR 83 | Ht 64.0 in | Wt 223.0 lb

## 2022-07-09 DIAGNOSIS — F419 Anxiety disorder, unspecified: Secondary | ICD-10-CM

## 2022-07-09 DIAGNOSIS — I10 Essential (primary) hypertension: Secondary | ICD-10-CM

## 2022-07-09 DIAGNOSIS — G620 Drug-induced polyneuropathy: Secondary | ICD-10-CM

## 2022-07-09 DIAGNOSIS — F321 Major depressive disorder, single episode, moderate: Secondary | ICD-10-CM | POA: Diagnosis not present

## 2022-07-09 DIAGNOSIS — M25551 Pain in right hip: Secondary | ICD-10-CM | POA: Insufficient documentation

## 2022-07-09 DIAGNOSIS — E785 Hyperlipidemia, unspecified: Secondary | ICD-10-CM | POA: Diagnosis not present

## 2022-07-09 DIAGNOSIS — R7303 Prediabetes: Secondary | ICD-10-CM

## 2022-07-09 DIAGNOSIS — T451X5A Adverse effect of antineoplastic and immunosuppressive drugs, initial encounter: Secondary | ICD-10-CM

## 2022-07-09 DIAGNOSIS — E559 Vitamin D deficiency, unspecified: Secondary | ICD-10-CM

## 2022-07-09 NOTE — Assessment & Plan Note (Signed)
Currently prescribed Lyrica 75 mg 3 times daily.  This is managed by her oncologist.

## 2022-07-09 NOTE — Assessment & Plan Note (Signed)
Her acute concern today is right hip pain that has significantly worsened over the last month.  She is currently prescribed oxycodone and Lyrica for pain management.  Recent CT demonstrating has documented degenerative changes in the right hip. -Right hip x-rays ordered today -Orthopedic surgery referral placed

## 2022-07-09 NOTE — Assessment & Plan Note (Signed)
Noted on previous labs.  A1c 6.3 in May 2023.  Repeat A1c ordered today

## 2022-07-09 NOTE — Patient Instructions (Signed)
It was a pleasure to see you today.  Thank you for giving [place] the opportunity to be involved in your care.  Below is a brief recap of your visit and next steps.  We will plan to see you again in 6 months.  Summary No medications changes Repeat labs ordered Right hip xrays ordered and orthopedic surgery referral placed

## 2022-07-09 NOTE — Progress Notes (Signed)
Established Patient Office Visit  Subjective   Patient ID: Jessica [name], female    DOB: [birth year]  Age: 62 y.o. MRN: [id number]  Chief Complaint  Patient presents with   Hyperlipidemia    Follow up    Jessica [name] returns to care today for routine follow-up.  She was last evaluated by me on 2/8 as part of a welcome to Medicare visit.  Previously seen for routine follow-up in November 2023.  In the interim she has been seen by oncology and has also been evaluated by cardiology in the setting of atypical chest pain.  She has undergone Lexiscan and CT coronary calcium.  There have otherwise been no acute interval events  Jessica [name] was tearful during today's encounter because of right hip pain.  She states that pain has appreciably worsened over the last month.  She is currently prescribed oxycodone and Lyrica by her oncologist.  Despite taking her medications as prescribed, her pain has not improved.  Prior imaging has documented degenerative changes in the right hip, but no dedicated imaging has been performed.  She does not have any additional concerns to discuss today.  Past Medical History:  Diagnosis Date   Allergy    Cancer (HCC)    [facility] 09/05/2019   Cancer of skin of back    Elevated blood pressure reading    Endometrial cancer (HCC)    Hyperlipidemia    Hypertension    PMB (postmenopausal bleeding)    Port-A-Cath in place 12/24/2018   Past Surgical History:  Procedure Laterality Date   ABDOMINAL HYSTERECTOMY N/A    [facility] 09/05/2019   IR IMAGING GUIDED PORT INSERTION  12/28/2018   IR RADIOLOGIST EVAL & MGMT  06/02/2018   IR RADIOLOGIST EVAL & MGMT  06/16/2018   NECK SURGERY  2000   spinal surgery , titanium rod in place    ROBOTIC ASSISTED TOTAL HYSTERECTOMY WITH BILATERAL SALPINGO OOPHERECTOMY N/A 04/13/2018   Procedure: XI ROBOTIC ASSISTED TOTAL HYSTERECTOMY WITH BILATERAL SALPINGO OOPHORECTOMY;  Surgeon: Adolphus Birchwood, MD;  Location: [facility];  Service:  Gynecology;  Laterality: N/A;   ROBOTIC PELVIC AND PARA-AORTIC LYMPH NODE DISSECTION N/A 04/13/2018   Procedure: XI ROBOTIC PELVIC LYMPHADECTOMY AND PARA-AORTIC LYMPH NODE DISSECTION;  Surgeon: Adolphus Birchwood, MD;  Location: [facility];  Service: Gynecology;  Laterality: N/A;   Social History   Tobacco Use   Smoking status: Never   Smokeless tobacco: Never  Vaping Use   Vaping Use: Never used  Substance Use Topics   Alcohol use: Yes    Alcohol/week: 4.0 standard drinks of alcohol    Types: 4 Glasses of wine per week    Comment: weekends   Drug use: Never   Family History  Problem Relation Age of Onset   Hypertension Mother    Breast cancer Mother    Hypertension Father    Heart disease Father    Cancer Father        lung   Cancer Sister        ovarian cancer   Melanoma Sister    Allergies  Allergen Reactions   Carboplatin Other (See Comments)    "body on fire and abdominal pain"   Nickel Rash    itchy   Review of Systems  Musculoskeletal:  Positive for joint pain (Acute pain of right hip).  All other systems reviewed and are negative.    Objective:     BP 122/77   Pulse 83   Ht 5\' 4"  (1.626  m)   Wt 223 lb (101.2 kg)   SpO2 97%   BMI 38.28 kg/m  BP Readings from Last 3 Encounters:  07/09/22 122/77  06/02/22 132/67  05/26/22 125/68   Physical Exam Vitals reviewed.  Constitutional:      General: She is not in acute distress.    Appearance: Normal appearance. She is obese. She is not toxic-appearing.  HENT:     Head: Normocephalic and atraumatic.     Right Ear: External ear normal.     Left Ear: External ear normal.     Nose: Nose normal. No congestion or rhinorrhea.     Mouth/Throat:     Mouth: Mucous membranes are moist.     Pharynx: Oropharynx is clear. No oropharyngeal exudate or posterior oropharyngeal erythema.  Eyes:     General: No scleral icterus.    Extraocular Movements: Extraocular movements intact.     Conjunctiva/sclera: Conjunctivae normal.      Pupils: Pupils are equal, round, and reactive to light.  Cardiovascular:     Rate and Rhythm: Normal rate and regular rhythm.     Pulses: Normal pulses.     Heart sounds: Normal heart sounds. No murmur heard.    No friction rub. No gallop.  Pulmonary:     Effort: Pulmonary effort is normal.     Breath sounds: Normal breath sounds. No wheezing, rhonchi or rales.  Abdominal:     General: Abdomen is flat. Bowel sounds are normal. There is no distension.     Palpations: Abdomen is soft. There is no mass.     Tenderness: There is no abdominal tenderness. There is no guarding.     Hernia: No hernia is present.  Musculoskeletal:        General: No swelling.     Cervical back: Normal range of motion.     Right lower leg: No edema.     Left lower leg: No edema.  Lymphadenopathy:     Cervical: No cervical adenopathy.  Skin:    General: Skin is warm and dry.     Capillary Refill: Capillary refill takes less than 2 seconds.     Coloration: Skin is not jaundiced.  Neurological:     General: No focal deficit present.     Mental Status: She is alert and oriented to person, place, and time.  Psychiatric:        Mood and Affect: Mood normal.        Behavior: Behavior normal.     Comments: Tearful during today's encounter   Last CBC Lab Results  Component Value Date   WBC 9.9 05/26/2022   HGB 12.9 05/26/2022   HCT 39.6 05/26/2022   MCV 81.3 05/26/2022   MCH 26.5 05/26/2022   RDW 16.9 (H) 05/26/2022   PLT 336 05/26/2022   Last metabolic panel Lab Results  Component Value Date   GLUCOSE 72 05/26/2022   NA 139 05/26/2022   K 3.9 05/26/2022   CL 106 05/26/2022   CO2 23 05/26/2022   BUN 15 05/26/2022   CREATININE 0.62 05/26/2022   GFRNONAA >60 05/26/2022   CALCIUM 9.1 05/26/2022   PROT 7.4 05/26/2022   ALBUMIN 3.9 05/26/2022   LABGLOB 2.2 04/26/2018   AGRATIO 1.9 04/26/2018   BILITOT 0.3 05/26/2022   ALKPHOS 131 (H) 05/26/2022   AST 26 05/26/2022   ALT 28 05/26/2022    ANIONGAP 10 05/26/2022   Last lipids Lab Results  Component Value Date   CHOL 197 06/20/2021  HDL 81 06/20/2021   LDLCALC 104 (H) 06/20/2021   TRIG 63 06/20/2021   CHOLHDL 2.4 06/20/2021   Last hemoglobin A1c Lab Results  Component Value Date   HGBA1C 6.3 (H) 06/12/2021   Last thyroid functions Lab Results  Component Value Date   TSH 3.140 06/12/2021   T4TOTAL 7.9 04/26/2018   Last vitamin D Lab Results  Component Value Date   VD25OH 20.7 (L) 06/12/2021   The 10-year ASCVD risk score (Arnett DK, et al., 2019) is: 2.7%    Assessment & Plan:   Problem List Items Addressed This Visit       Essential hypertension (Chronic)    BP remains well-controlled with amlodipine 5 mg daily.  No medication changes are indicated today      Peripheral neuropathy due to chemotherapy [facility])    Currently prescribed Lyrica 75 mg 3 times daily.  This is managed by her oncologist.      Hyperlipidemia (Chronic)    She is currently prescribed atorvastatin 20 mg daily.  Repeat lipid panel ordered today.      Depression, major, single episode, moderate (HCC)    Her PHQ-9 score is elevated today.  This is largely attributed to right hip pain.  She is currently prescribed sertraline 100 mg daily. -Addressing right hip pain as noted above      Vitamin D deficiency    Noted on previous labs.  Repeat vitamin D level ordered today      Prediabetes    Noted on previous labs.  A1c 6.3 in May 2023.  Repeat A1c ordered today      Acute right hip pain - Primary    Her acute concern today is right hip pain that has significantly worsened over the last month.  She is currently prescribed oxycodone and Lyrica for pain management.  Recent CT demonstrating has documented degenerative changes in the right hip. -Right hip x-rays ordered today -Orthopedic surgery referral placed      Return in about 6 months (around 01/08/2023).   Billie Lade, MD

## 2022-07-09 NOTE — Assessment & Plan Note (Signed)
Her PHQ-9 score is elevated today.  This is largely attributed to right hip pain.  She is currently prescribed sertraline 100 mg daily. -Addressing right hip pain as noted above

## 2022-07-09 NOTE — Assessment & Plan Note (Signed)
She is currently prescribed atorvastatin 20 mg daily. -Repeat lipid panel ordered today 

## 2022-07-09 NOTE — Assessment & Plan Note (Signed)
Noted on previous labs.  Repeat vitamin D level ordered today

## 2022-07-09 NOTE — Assessment & Plan Note (Signed)
BP remains well-controlled with amlodipine 5 mg daily.  No medication changes are indicated today

## 2022-07-10 ENCOUNTER — Other Ambulatory Visit: Payer: Self-pay

## 2022-07-10 ENCOUNTER — Encounter: Payer: Self-pay | Admitting: Hematology

## 2022-07-10 LAB — HEMOGLOBIN A1C
Est. average glucose Bld gHb Est-mCnc: 134 mg/dL
Hgb A1c MFr Bld: 6.3 % — ABNORMAL HIGH (ref 4.8–5.6)

## 2022-07-10 LAB — TSH+FREE T4
Free T4: 1.07 ng/dL (ref 0.82–1.77)
TSH: 0.907 u[IU]/mL (ref 0.450–4.500)

## 2022-07-10 LAB — CMP14+EGFR
ALT: 16 IU/L (ref 0–32)
AST: 22 IU/L (ref 0–40)
Albumin: 4.8 g/dL (ref 3.8–4.9)
Alkaline Phosphatase: 168 IU/L — ABNORMAL HIGH (ref 44–121)
BUN/Creatinine Ratio: 15 (ref 9–23)
BUN: 12 mg/dL (ref 6–24)
Bilirubin Total: 0.3 mg/dL (ref 0.0–1.2)
CO2: 21 mmol/L (ref 20–29)
Calcium: 9.9 mg/dL (ref 8.7–10.2)
Chloride: 103 mmol/L (ref 96–106)
Creatinine, Ser: 0.82 mg/dL (ref 0.57–1.00)
Globulin, Total: 2.4 g/dL (ref 1.5–4.5)
Glucose: 101 mg/dL — ABNORMAL HIGH (ref 70–99)
Potassium: 4.3 mmol/L (ref 3.5–5.2)
Sodium: 142 mmol/L (ref 134–144)
Total Protein: 7.2 g/dL (ref 6.0–8.5)
eGFR: 82 mL/min/{1.73_m2} (ref >59)

## 2022-07-10 LAB — LIPID PANEL
Chol/HDL Ratio: 2 ratio (ref 0.0–4.4)
Cholesterol, Total: 213 mg/dL — ABNORMAL HIGH (ref 100–199)
HDL: 107 mg/dL (ref >39)
LDL Chol Calc (NIH): 92 mg/dL (ref 0–99)
Triglycerides: 78 mg/dL (ref 0–149)
VLDL Cholesterol Cal: 14 mg/dL (ref 5–40)

## 2022-07-10 LAB — CBC WITH DIFFERENTIAL/PLATELET
Basophils Absolute: 0.1 10*3/uL (ref 0.0–0.2)
Basos: 1 %
EOS (ABSOLUTE): 0.1 10*3/uL (ref 0.0–0.4)
Eos: 1 %
Hematocrit: 42.6 % (ref 34.0–46.6)
Hemoglobin: 13.8 g/dL (ref 11.1–15.9)
Immature Grans (Abs): 0 10*3/uL (ref 0.0–0.1)
Immature Granulocytes: 0 %
Lymphocytes Absolute: 2.7 10*3/uL (ref 0.7–3.1)
Lymphs: 28 %
MCH: 26.6 pg (ref 26.6–33.0)
MCHC: 32.4 g/dL (ref 31.5–35.7)
MCV: 82 fL (ref 79–97)
Monocytes Absolute: 0.6 10*3/uL (ref 0.1–0.9)
Monocytes: 6 %
Neutrophils Absolute: 6.2 10*3/uL (ref 1.4–7.0)
Neutrophils: 64 %
Platelets: 320 10*3/uL (ref 150–450)
RBC: 5.18 x10E6/uL (ref 3.77–5.28)
RDW: 15.3 % (ref 11.7–15.4)
WBC: 9.7 10*3/uL (ref 3.4–10.8)

## 2022-07-10 LAB — B12 AND FOLATE PANEL
Folate: 9.3 ng/mL (ref >3.0)
Vitamin B-12: 632 pg/mL (ref 232–1245)

## 2022-07-10 LAB — VITAMIN D 25 HYDROXY (VIT D DEFICIENCY, FRACTURES): Vit D, 25-Hydroxy: 38.5 ng/mL (ref 30.0–100.0)

## 2022-07-11 ENCOUNTER — Other Ambulatory Visit: Payer: Self-pay

## 2022-07-11 DIAGNOSIS — R748 Abnormal levels of other serum enzymes: Secondary | ICD-10-CM

## 2022-07-22 ENCOUNTER — Ambulatory Visit (INDEPENDENT_AMBULATORY_CARE_PROVIDER_SITE_OTHER): Payer: 59 | Admitting: Orthopedic Surgery

## 2022-07-22 ENCOUNTER — Encounter: Payer: Self-pay | Admitting: Orthopedic Surgery

## 2022-07-22 VITALS — BP 139/80 | HR 71 | Ht 64.0 in | Wt 224.0 lb

## 2022-07-22 DIAGNOSIS — M1611 Unilateral primary osteoarthritis, right hip: Secondary | ICD-10-CM

## 2022-07-22 MED ORDER — PREDNISONE 10 MG (21) PO TBPK: ORAL_TABLET | ORAL | 0 refills | Status: DC

## 2022-07-22 NOTE — Progress Notes (Signed)
New Patient Visit  Assessment: Jessica [name] is a 62 y.o. female with the following: 1. Arthritis of right hip  Plan: Jessica [name] has pain in her right hip.  It is possible that some of her pain is from sciatica.  However, she has severe right hip arthritis and her hip joint is clearly irritated as it is painful with movement.  We reviewed radiographs in clinic today, which demonstrates advanced degenerative changes of the right hip.  Ultimately, she may benefit from right total hip arthroplasty.  This was briefly discussed with the patient.  I am reluctant to inject her right hip at today's visit, as this would delay any consideration for surgery for at least 3 months.  She continues to undergo treatment for uterine cancer, and it is possible that she is not a good surgical candidate.  Nonetheless, we discussed proceeding with prednisone, to see if this improves her current symptoms.  This would also allow her to get a steroid injection in the right hip, if surgery is not a reasonable option anytime soon.  She states understanding.  She is in agreement with this plan.  She will update me if she has any further issues, and we will let her know once I have discussed her treatment with Dr. Magnus Ivan.  Follow-up: Return if symptoms worsen or fail to improve.  Subjective:  Chief Complaint  Patient presents with   Back Pain    LBP down to her toes that has gotten really bad over night. PT has a hx of uterinc cx and was having chemo tx every 3 wks that she feels has caused a lot of her symptoms.     History of Present Illness: Jessica [name] is a 62 y.o. female who has been referred by Jessica Landau, MD for evaluation of right hip pain.  She has had progressively worsening right hip pain over the past few weeks.  It has gotten severe.  It is affecting her sleep.  She has some pain in the right groin, but also has pain in the right buttock.  She has been taking oxycodone, provided to her by her  oncologist.  She will take Tylenol and ibuprofen as needed.  She has difficulty walking.  No prior injury to her lower back, or her right hip.  No injections.  No therapy.   Review of Systems: No fevers or chills No numbness or tingling No chest pain No shortness of breath No bowel or bladder dysfunction No GI distress No headaches   Medical History:  Past Medical History:  Diagnosis Date   Allergy    Cancer (HCC)    [facility] 09/05/2019   Cancer of skin of back    Elevated blood pressure reading    Endometrial cancer (HCC)    Hyperlipidemia    Hypertension    PMB (postmenopausal bleeding)    Port-A-Cath in place 12/24/2018    Past Surgical History:  Procedure Laterality Date   ABDOMINAL HYSTERECTOMY N/A    [facility] 09/05/2019   IR IMAGING GUIDED PORT INSERTION  12/28/2018   IR RADIOLOGIST EVAL & MGMT  06/02/2018   IR RADIOLOGIST EVAL & MGMT  06/16/2018   NECK SURGERY  2000   spinal surgery , titanium rod in place    ROBOTIC ASSISTED TOTAL HYSTERECTOMY WITH BILATERAL SALPINGO OOPHERECTOMY N/A 04/13/2018   Procedure: XI ROBOTIC ASSISTED TOTAL HYSTERECTOMY WITH BILATERAL SALPINGO OOPHORECTOMY;  Surgeon: Adolphus Birchwood, MD;  Location: [facility];  Service: Gynecology;  Laterality: N/A;  ROBOTIC PELVIC AND PARA-AORTIC LYMPH NODE DISSECTION N/A 04/13/2018   Procedure: XI ROBOTIC PELVIC LYMPHADECTOMY AND PARA-AORTIC LYMPH NODE DISSECTION;  Surgeon: Adolphus Birchwood, MD;  Location: [facility];  Service: Gynecology;  Laterality: N/A;    Family History  Problem Relation Age of Onset   Hypertension Mother    Breast cancer Mother    Hypertension Father    Heart disease Father    Cancer Father        lung   Cancer Sister        ovarian cancer   Melanoma Sister    Social History   Tobacco Use   Smoking status: Never   Smokeless tobacco: Never  Vaping Use   Vaping Use: Never used  Substance Use Topics   Alcohol use: Yes    Alcohol/week: 4.0 standard drinks of alcohol    Types: 4  Glasses of wine per week    Comment: weekends   Drug use: Never    Allergies  Allergen Reactions   Carboplatin Other (See Comments)    "body on fire and abdominal pain"   Nickel Rash    itchy    Current Meds  Medication Sig   predniSONE (STERAPRED UNI-PAK 21 TAB) 10 MG (21) TBPK tablet 10 mg DS 12 as directed    Objective: BP 139/80   Pulse 71   Ht 5\' 4"  (1.626 m)   Wt 224 lb (101.6 kg)   BMI 38.45 kg/m   Physical Exam:  General: Alert and oriented.  She is tearful, and in obvious discomfort. Gait: Unable to ambulate.  Evaluation of the right hip demonstrates no bruising.  No swelling.  She has pain in the groin area with gentle range of motion of the right hip.  Pain in the anterior hip with extension of the knee.  Sensation is intact distally.  IMAGING: I personally reviewed images previously obtained in clinic   X-rays of the right hip were previously obtained.  Complete loss of joint space.  There is bone on bone articulation within the right hip.   New Medications:  Meds ordered this encounter  Medications   predniSONE (STERAPRED UNI-PAK 21 TAB) 10 MG (21) TBPK tablet    Sig: 10 mg DS 12 as directed    Dispense:  48 tablet    Refill:  0      Oliver Barre, MD  07/22/2022 12:52 PM

## 2022-08-04 ENCOUNTER — Encounter: Payer: Self-pay | Admitting: Radiology

## 2022-08-04 ENCOUNTER — Other Ambulatory Visit: Payer: Self-pay | Admitting: Orthopedic Surgery

## 2022-08-04 ENCOUNTER — Telehealth: Payer: Self-pay | Admitting: Orthopedic Surgery

## 2022-08-04 DIAGNOSIS — M1611 Unilateral primary osteoarthritis, right hip: Secondary | ICD-10-CM

## 2022-08-04 NOTE — Telephone Encounter (Signed)
Dr. Dallas Schimke pt - pt lvm wanting to know if Dr. Dallas Schimke has spoken to Dr. Magnus Ivan and what the status is.

## 2022-08-05 ENCOUNTER — Other Ambulatory Visit: Payer: Self-pay | Admitting: Hematology

## 2022-08-06 ENCOUNTER — Other Ambulatory Visit: Payer: Self-pay | Admitting: Internal Medicine

## 2022-08-06 DIAGNOSIS — F321 Major depressive disorder, single episode, moderate: Secondary | ICD-10-CM

## 2022-08-06 DIAGNOSIS — F419 Anxiety disorder, unspecified: Secondary | ICD-10-CM

## 2022-08-07 ENCOUNTER — Other Ambulatory Visit: Payer: Self-pay

## 2022-08-07 ENCOUNTER — Ambulatory Visit
Admission: EM | Admit: 2022-08-07 | Discharge: 2022-08-07 | Disposition: A | Discharge status: Home / Self Care | Payer: 59 | Attending: Family Medicine | Admitting: Family Medicine

## 2022-08-07 DIAGNOSIS — Z1152 Encounter for screening for COVID-19: Secondary | ICD-10-CM | POA: Diagnosis not present

## 2022-08-07 DIAGNOSIS — R519 Headache, unspecified: Secondary | ICD-10-CM | POA: Diagnosis not present

## 2022-08-07 DIAGNOSIS — R8290 Unspecified abnormal findings in urine: Secondary | ICD-10-CM | POA: Diagnosis not present

## 2022-08-07 DIAGNOSIS — R5383 Other fatigue: Secondary | ICD-10-CM

## 2022-08-07 DIAGNOSIS — R42 Dizziness and giddiness: Secondary | ICD-10-CM | POA: Diagnosis not present

## 2022-08-07 LAB — POCT URINALYSIS DIP (MANUAL ENTRY)
Bilirubin, UA: NEGATIVE
Blood, UA: NEGATIVE
Glucose, UA: NEGATIVE mg/dL
Ketones, POC UA: NEGATIVE mg/dL
Leukocytes, UA: NEGATIVE
Nitrite, UA: POSITIVE — AB
Protein Ur, POC: NEGATIVE mg/dL
Spec Grav, UA: 1.02 (ref 1.010–1.025)
Urobilinogen, UA: 0.2 E.U./dL
pH, UA: 6 (ref 5.0–8.0)

## 2022-08-07 LAB — POCT FASTING CBG KUC MANUAL ENTRY: POCT Glucose (KUC): 117 mg/dL — AB (ref 70–99)

## 2022-08-07 MED ORDER — MECLIZINE HCL 25 MG PO TABS: 25.0000 mg | ORAL_TABLET | Freq: Three times a day (TID) | ORAL | 0 refills | Status: DC | PRN

## 2022-08-07 NOTE — ED Provider Notes (Signed)
[facility]    CSN: [id number] Arrival date & time: 08/07/22  1145      History   Chief Complaint No chief complaint on file.   HPI Jessica [name] is a 62 y.o. female.   Patient presenting today with 3-day history of severe fatigue, feeling weak, headache and room spinning dizziness worse with movement of the head.  She denies chest pain, shortness of breath, palpitations, mental status changes, unsteady gait, recent head injury, recent illness, history of similar issues.  The only new medication recently as she completed a course of prednisone almost a week ago from the orthopedist for hip pain.  She states she did not have any symptoms while taking the medication.  So far not tried anything over-the-counter for symptoms.    Past Medical History:  Diagnosis Date   Allergy    Cancer (HCC)    Phreesia 09/05/2019   Cancer of skin of back    Elevated blood pressure reading    Endometrial cancer (HCC)    Hyperlipidemia    Hypertension    PMB (postmenopausal bleeding)    Port-A-Cath in place 12/24/2018    Patient Active Problem List   Diagnosis Date Noted   Acute right hip pain 07/09/2022   Cardiac chest pain 04/10/2022   Cholelithiasis 12/20/2021   Vitamin D deficiency 06/18/2021   Prediabetes 06/18/2021   Annual physical exam 06/18/2021   Impaired vision in both eyes 06/18/2021   Breast tenderness in female 06/18/2021   Neck pain, acute 02/11/2021   Headache 02/11/2021   Morbid obesity (HCC) 01/28/2021   Acute bronchitis 01/28/2021   Insomnia 01/28/2021   SIRS (systemic inflammatory response syndrome) (HCC) 12/07/2020   Hypotension    Peripheral neuropathy due to chemotherapy (HCC) 06/27/2020   Anemia due to antineoplastic chemotherapy 06/27/2020   Depression, major, single episode, moderate (HCC) 09/07/2019   Anxiety 09/07/2019   Port-A-Cath in place 12/24/2018   Goals of care, counseling/discussion 12/23/2018   Secondary malignant neoplasm of  pelvic peritoneum (HCC) 12/14/2018   Elevated LFTs 05/05/2018   Hyperlipidemia 04/27/2018   Essential hypertension 04/26/2018   Endometrial cancer (HCC) 03/31/2018   H/O total hysterectomy 03/2018    Past Surgical History:  Procedure Laterality Date   ABDOMINAL HYSTERECTOMY N/A    Phreesia 09/05/2019   IR IMAGING GUIDED PORT INSERTION  12/28/2018   IR RADIOLOGIST EVAL & MGMT  06/02/2018   IR RADIOLOGIST EVAL & MGMT  06/16/2018   NECK SURGERY  2000   spinal surgery , titanium rod in place    ROBOTIC ASSISTED TOTAL HYSTERECTOMY WITH BILATERAL SALPINGO OOPHERECTOMY N/A 04/13/2018   Procedure: XI ROBOTIC ASSISTED TOTAL HYSTERECTOMY WITH BILATERAL SALPINGO OOPHORECTOMY;  Surgeon: Adolphus Birchwood, MD;  Location: [facility];  Service: Gynecology;  Laterality: N/A;   ROBOTIC PELVIC AND PARA-AORTIC LYMPH NODE DISSECTION N/A 04/13/2018   Procedure: XI ROBOTIC PELVIC LYMPHADECTOMY AND PARA-AORTIC LYMPH NODE DISSECTION;  Surgeon: Adolphus Birchwood, MD;  Location: [facility];  Service: Gynecology;  Laterality: N/A;    OB History     Gravida  4   Para  3   Term  3   Preterm  0   AB  1   Living  3      SAB  0   IAB  1   Ectopic  0   Multiple  0   Live Births  3            Home Medications    Prior to Admission medications  Medication Sig Start Date End Date Taking? Authorizing Provider  meclizine (ANTIVERT) 25 MG tablet Take 1 tablet (25 mg total) by mouth 3 (three) times daily as needed for dizziness. 08/07/22  Yes Particia Nearing, PA-C  amLODipine (NORVASC) 5 MG tablet Take 1 tablet by mouth once daily 07/08/22   Billie Lade, MD  atorvastatin (LIPITOR) 20 MG tablet Take 1 tablet by mouth once daily 07/08/22   Billie Lade, MD  Cholecalciferol (VITAMIN D3) 25 MCG (1000 UT) CAPS Take 1 capsule (1,000 Units total) by mouth daily. 06/18/21   Donell Beers, FNP  EPINEPHrine 0.3 mg/0.3 mL IJ SOAJ injection Inject 0.3 mg into the muscle as needed for anaphylaxis. 10/05/19    Burgess Amor, PA-C  lidocaine-prilocaine (EMLA) cream Apply to port site prior to use 08/09/20   Doreatha Massed, MD  MAGNESIUM-OXIDE 400 (240 Mg) MG tablet Take 1 tablet by mouth twice daily 08/05/22   Doreatha Massed, MD  metoprolol tartrate (LOPRESSOR) 100 MG tablet Take tablet TWO hours prior to your cardiac CT scan. 04/21/22   Mallipeddi, Vishnu P, MD  nitroGLYCERIN (NITROSTAT) 0.4 MG SL tablet Place 1 tablet (0.4 mg total) under the tongue every 5 (five) minutes as needed for chest pain. 04/10/22 07/09/22  Mallipeddi, Vishnu P, MD  oxyCODONE-acetaminophen (PERCOCET) 5-325 MG tablet Take 1 tablet by mouth every 12 (twelve) hours as needed (pain). 06/19/22   Carnella Guadalaj[place], PA-C  PACLITAXEL IV Inject into the vein every 21 ( twenty-one) days. 12/29/18   [provider]  predniSONE (STERAPRED UNI-PAK 21 TAB) 10 MG (21) TBPK tablet 10 mg DS 12 as directed 07/22/22   Oliver Barre, MD  pregabalin (LYRICA) 75 MG capsule TAKE 1 CAPSULE BY MOUTH THREE TIMES DAILY 06/03/22   Doreatha Massed, MD  prochlorperazine (COMPAZINE) 10 MG tablet Take 1 tablet (10 mg total) by mouth every 6 (six) hours as needed (Nausea or vomiting). 11/25/21   Doreatha Massed, MD  sertraline (ZOLOFT) 100 MG tablet Take 1 tablet by mouth once daily 08/06/22   Billie Lade, MD    Family History Family History  Problem Relation Age of Onset   Hypertension Mother    Breast cancer Mother    Hypertension Father    Heart disease Father    Cancer Father        lung   Cancer Sister        ovarian cancer   Melanoma Sister     Social History Social History   Tobacco Use   Smoking status: Never   Smokeless tobacco: Never  Vaping Use   Vaping status: Never Used  Substance Use Topics   Alcohol use: Yes    Alcohol/week: 4.0 standard drinks of alcohol    Types: 4 Glasses of wine per week    Comment: weekends   Drug use: Never     Allergies   Carboplatin and Nickel   Review of  Systems Review of Systems Per HPI  Physical Exam Triage Vital Signs ED Triage Vitals  Encounter Vitals Group     BP 08/07/22 1156 138/84     Systolic BP Percentile --      Diastolic BP Percentile --      Pulse Rate 08/07/22 1156 91     Resp 08/07/22 1156 20     Temp 08/07/22 1156 98.2 F (36.8 C)     Temp Source 08/07/22 1154 Oral     SpO2 08/07/22 1156 94 %  Weight --      Height --      Head Circumference --      Peak Flow --      Pain Score 08/07/22 1156 0     Pain Loc --      Pain Education --      Exclude from Growth Chart --    Orthostatic VS for the past 24 hrs:  BP- Lying Pulse- Lying BP- Sitting Pulse- Sitting BP- Standing at 0 minutes Pulse- Standing at 0 minutes  08/07/22 1157 138/81 68 144/72 69 126/84 82    Updated Vital Signs BP 138/84 (BP Location: Right Arm)   Pulse 91   Temp 98.2 F (36.8 C) (Oral)   Resp 20   SpO2 94%   Visual Acuity Right Eye Distance:   Left Eye Distance:   Bilateral Distance:    Right Eye Near:   Left Eye Near:    Bilateral Near:     Physical Exam Vitals and nursing note reviewed.  Constitutional:      Appearance: Normal appearance. She is not ill-appearing.  HENT:     Head: Atraumatic.     Mouth/Throat:     Mouth: Mucous membranes are moist.     Pharynx: Oropharynx is clear.  Eyes:     Extraocular Movements: Extraocular movements intact.     Conjunctiva/sclera: Conjunctivae normal.     Pupils: Pupils are equal, round, and reactive to light.  Cardiovascular:     Rate and Rhythm: Normal rate and regular rhythm.     Heart sounds: Normal heart sounds.  Pulmonary:     Effort: Pulmonary effort is normal.     Breath sounds: Normal breath sounds.  Abdominal:     General: Bowel sounds are normal. There is no distension.     Palpations: Abdomen is soft.     Tenderness: There is no abdominal tenderness. There is no guarding.  Musculoskeletal:        General: No swelling or tenderness. Normal range of motion.      Cervical back: Normal range of motion and neck supple.  Skin:    General: Skin is warm and dry.     Findings: No erythema or rash.  Neurological:     General: No focal deficit present.     Mental Status: She is alert and oriented to person, place, and time.     Cranial Nerves: No cranial nerve deficit.     Sensory: No sensory deficit.     Motor: No weakness.     Gait: Gait normal.  Psychiatric:        Mood and Affect: Mood normal.        Thought Content: Thought content normal.        Judgment: Judgment normal.      UC Treatments / Results  Labs (all labs ordered are listed, but only abnormal results are displayed) Labs Reviewed  POCT FASTING CBG [facility] MANUAL ENTRY - Abnormal; Notable for the following components:      Result Value   POCT Glucose ([facility]) 117 (*)    All other components within normal limits  POCT URINALYSIS DIP (MANUAL ENTRY) - Abnormal; Notable for the following components:   Nitrite, UA Positive (*)    All other components within normal limits  SARS CORONAVIRUS 2 (TAT 6-24 HRS)  URINE CULTURE  CBC WITH DIFFERENTIAL/PLATELET  COMPREHENSIVE METABOLIC PANEL    EKG   Radiology No results found.  Procedures Procedures (including critical care time)  Medications Ordered in UC Medications - No data to display  Initial Impression / Assessment and Plan / UC Course  I have reviewed the triage vital signs and the nursing notes.  Pertinent labs & imaging results that were available during my care of the patient were reviewed by me and considered in my medical decision making (see chart for details).     Vital signs and exam overall reassuring with no obvious abnormalities.  EKG today appears similar to previous, normal sinus rhythm at 68 bpm without ST elevations.  Random point-of-care glucose benign, urinalysis abnormal only for positive nitrites which is likely unrelated to her symptoms though we will send out a urine culture for further exploration of this.   Labs pending for further rule out, suspect her dizziness is related to positional vertigo so we will treat with Epley maneuver's and meclizine and follow-up with primary care next week.  Regarding her fatigue and weakness and headache, will rule out viral cause as COVID is currently going around the community with a COVID test and await labs.  ED for severely worsening symptoms at any time.  Final Clinical Impressions(s) / UC Diagnoses   Final diagnoses:  Dizziness  Other fatigue  Abnormal urinalysis     Discharge Instructions      I suspect the room spinning dizziness that you are experiencing to be related to a positional vertigo.  I have attached a handout on how to perform the Epley maneuver at home and given you a medication called meclizine to help with the dizzy spells.  Slow, deliberate movements of the head can help lessen the amount of dizzy spells you have.  Follow-up with your primary care provider next week if not resolving.  We have also sent out some labs and we will call you if anything is abnormal.    ED Prescriptions     Medication Sig Dispense Auth. Provider   meclizine (ANTIVERT) 25 MG tablet Take 1 tablet (25 mg total) by mouth 3 (three) times daily as needed for dizziness. 30 tablet Particia Nearing, [place]      PDMP not reviewed this encounter.   Particia Nearing, [place] 08/07/22 1313

## 2022-08-07 NOTE — Discharge Instructions (Signed)
I suspect the room spinning dizziness that you are experiencing to be related to a positional vertigo.  I have attached a handout on how to perform the Epley maneuver at home and given you a medication called meclizine to help with the dizzy spells.  Slow, deliberate movements of the head can help lessen the amount of dizzy spells you have.  Follow-up with your primary care provider next week if not resolving.  We have also sent out some labs and we will call you if anything is abnormal.

## 2022-08-07 NOTE — ED Triage Notes (Signed)
Pt reports she feels "dehydrated". States she is on prednisone and feels as if the room is spinning, headache, and no energy x 3 days.

## 2022-08-08 LAB — COMPREHENSIVE METABOLIC PANEL
ALT: 25 IU/L (ref 0–32)
AST: 20 IU/L (ref 0–40)
Albumin: 4.2 g/dL (ref 3.8–4.9)
Alkaline Phosphatase: 139 IU/L — ABNORMAL HIGH (ref 44–121)
BUN/Creatinine Ratio: 21 (ref 9–23)
BUN: 14 mg/dL (ref 6–24)
Bilirubin Total: 0.2 mg/dL (ref 0.0–1.2)
CO2: 19 mmol/L — ABNORMAL LOW (ref 20–29)
Calcium: 9.5 mg/dL (ref 8.7–10.2)
Chloride: 102 mmol/L (ref 96–106)
Creatinine, Ser: 0.68 mg/dL (ref 0.57–1.00)
Globulin, Total: 2.5 g/dL (ref 1.5–4.5)
Glucose: 101 mg/dL — ABNORMAL HIGH (ref 70–99)
Potassium: 4.3 mmol/L (ref 3.5–5.2)
Sodium: 140 mmol/L (ref 134–144)
Total Protein: 6.7 g/dL (ref 6.0–8.5)
eGFR: 100 mL/min/{1.73_m2} (ref >59)

## 2022-08-08 LAB — CBC WITH DIFFERENTIAL/PLATELET
Basophils Absolute: 0.1 10*3/uL (ref 0.0–0.2)
Basos: 1 %
EOS (ABSOLUTE): 0.3 10*3/uL (ref 0.0–0.4)
Eos: 4 %
Hematocrit: 43.3 % (ref 34.0–46.6)
Hemoglobin: 14.3 g/dL (ref 11.1–15.9)
Immature Grans (Abs): 0 10*3/uL (ref 0.0–0.1)
Immature Granulocytes: 0 %
Lymphocytes Absolute: 2 10*3/uL (ref 0.7–3.1)
Lymphs: 29 %
MCH: 27.6 pg (ref 26.6–33.0)
MCHC: 33 g/dL (ref 31.5–35.7)
MCV: 83 fL (ref 79–97)
Monocytes Absolute: 0.5 10*3/uL (ref 0.1–0.9)
Monocytes: 7 %
Neutrophils Absolute: 4.3 10*3/uL (ref 1.4–7.0)
Neutrophils: 59 %
Platelets: 252 10*3/uL (ref 150–450)
RBC: 5.19 x10E6/uL (ref 3.77–5.28)
RDW: 15.9 % — ABNORMAL HIGH (ref 11.7–15.4)
WBC: 7.2 10*3/uL (ref 3.4–10.8)

## 2022-08-08 LAB — SARS CORONAVIRUS 2 (TAT 6-24 HRS): SARS Coronavirus 2: NEGATIVE

## 2022-08-11 ENCOUNTER — Other Ambulatory Visit: Payer: Self-pay | Admitting: *Deleted

## 2022-08-11 DIAGNOSIS — C541 Malignant neoplasm of endometrium: Secondary | ICD-10-CM

## 2022-08-11 MED ORDER — OXYCODONE-ACETAMINOPHEN 5-325 MG PO TABS
1.0000 | ORAL_TABLET | Freq: Two times a day (BID) | ORAL | 0 refills | Status: DC | PRN
Start: 2022-08-11 — End: 2022-09-21

## 2022-08-13 ENCOUNTER — Ambulatory Visit (INDEPENDENT_AMBULATORY_CARE_PROVIDER_SITE_OTHER): Payer: 59 | Admitting: Orthopaedic Surgery

## 2022-08-13 ENCOUNTER — Encounter: Payer: Self-pay | Admitting: Orthopaedic Surgery

## 2022-08-13 VITALS — Wt 225.0 lb

## 2022-08-13 DIAGNOSIS — M1611 Unilateral primary osteoarthritis, right hip: Secondary | ICD-10-CM | POA: Diagnosis not present

## 2022-08-13 NOTE — Progress Notes (Signed)
Office Visit Note   Patient: Jessica [name]           Date of Birth: 01-18-1961           MRN: [id number] Visit Date: 08/13/2022              Requested by: Oliver Barre, MD [phone] [address] [place],  [place] [address] PCP: Billie Lade, MD   Assessment & Plan: Visit Diagnoses:  1. Arthritis of right hip     Plan: Severity of her right arthritis that is affecting her quality of life despite conservative treatment plasty.  Risk benefits of surgery and postoperative protocol discussed.  Risk include but are not limited to leg length discrepancy, nerve/vessel injury, DVT/PE, loss and infection.  Handout total hip arthroplasty was given.  Questions were encouraged and answered at length and myself.  Follow-Up Instructions: Return for post op.   Orders:  No orders of the defined types were placed in this encounter.  No orders of the defined types were placed in this encounter.     Procedures: No procedures performed   Clinical Data: No additional findings.   Subjective: Chief Complaint  Patient presents with   Right Hip - Pain    HPI Patient's 62 year old female were seen for the first time for right hip pain.  She states this been ongoing for greater than 6 months.  She has groin pain.  Pain with ambulation.  She has difficulty getting in and out of the vehicle due to the pain in the hip.  She also states that the pain in her hip makes it difficult for her to work.  She notes she has to sleep on her back at night due to the right hip pain she is unable to lie on either hip.  Radiographs AP pelvis and multiple views of the right hip dated 07/09/2022 showed end-stage arthritis.  Cystic changes within the femoral.  Periarticular osteophytes about the right femoral hip joint . Bilateral hips well located. No acute fractures.   As to help her with donning her shoes and socks on the right side due to pain.  She ranks her hip pain to be 10 out of 10 pain at worst.  She was on  Medrol Dosepak few weeks ago and states that this gave her some relief but was very short-lived. She does have a significant medical history history of uterine cancer metastasized to her stomach.  She is no chemo in the last year but still has an indwelling Port-A-Cath.  She has history of cardiac chest pain with elevated T's.  Stress test in March 2024.  Denies any recent chest pain or shortness of breath.  Review of Systems See HPI  Objective: Vital Signs: Wt 225 lb (102.1 kg)   BMI 38.62 kg/m   Physical Exam Constitutional:      Appearance: She is not ill-appearing or diaphoretic.  Pulmonary:     Effort: Pulmonary effort is normal.  Neurological:     Mental Status: She is alert and oriented to person, place, and time.  Psychiatric:        Mood and Affect: Mood normal.     Ortho Exam Bilateral hips good range of motion both hips.  Painful range of motion of the right hip.  She has limited flexion of the right hip feelings with cane slight antalgic gait.   Specialty Comments:  No specialty comments available.  Imaging: No results found.   PMFS History: Patient Active  Problem List   Diagnosis Date Noted   Acute right hip pain 07/09/2022   Cardiac chest pain 04/10/2022   Cholelithiasis 12/20/2021   Vitamin D deficiency 06/18/2021   Prediabetes 06/18/2021   Annual physical exam 06/18/2021   Impaired vision in both eyes 06/18/2021   Breast tenderness in female 06/18/2021   Neck pain, acute 02/11/2021   Headache 02/11/2021   Morbid obesity (HCC) 01/28/2021   Acute bronchitis 01/28/2021   Insomnia 01/28/2021   SIRS (systemic inflammatory response syndrome) (HCC) 12/07/2020   Hypotension    Peripheral neuropathy due to chemotherapy (HCC) 06/27/2020   Anemia due to antineoplastic chemotherapy 06/27/2020   Depression, major, single episode, moderate (HCC) 09/07/2019   Anxiety 09/07/2019   Port-A-Cath in place 12/24/2018   Goals of care, counseling/discussion 12/23/2018    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HCC) 12/14/2018   Elevated LFTs 05/05/2018   Hyperlipidemia 04/27/2018   Essential hypertension 04/26/2018   Endometrial cancer (HCC) 03/31/2018   H/O total hysterectomy 03/2018   Past Medical History:  Diagnosis Date   Allergy    Cancer (HCC)    Phreesia 09/05/2019   Cancer of skin of back    Elevated blood pressure reading    Endometrial cancer (HCC)    Hyperlipidemia    Hypertension    PMB (postmenopausal bleeding)    Port-A-Cath in place 12/24/2018    Family History  Problem Relation Age of Onset   Hypertension Mother    Breast cancer Mother    Hypertension Father    Heart disease Father    Cancer Father        lung   Cancer Sister        ovarian cancer   Melanoma Sister     Past Surgical History:  Procedure Laterality Date   ABDOMINAL HYSTERECTOMY N/A    Phreesia 09/05/2019   IR IMAGING GUIDED PORT INSERTION  12/28/2018   IR RADIOLOGIST EVAL & MGMT  06/02/2018   IR RADIOLOGIST EVAL & MGMT  06/16/2018   NECK SURGERY  2000   spinal surgery , titanium rod in place    ROBOTIC ASSISTED TOTAL HYSTERECTOMY WITH BILATERAL SALPINGO OOPHERECTOMY N/A 04/13/2018   Procedure: XI ROBOTIC ASSISTED TOTAL HYSTERECTOMY WITH BILATERAL SALPINGO OOPHORECTOMY;  Surgeon: Adolphus Birchwood, MD;  Location: [facility];  Service: Gynecology;  Laterality: N/A;   ROBOTIC PELVIC AND PARA-AORTIC LYMPH NODE DISSECTION N/A 04/13/2018   Procedure: XI ROBOTIC PELVIC LYMPHADECTOMY AND PARA-AORTIC LYMPH NODE DISSECTION;  Surgeon: Adolphus Birchwood, MD;  Location: [facility];  Service: Gynecology;  Laterality: N/A;   Social History   Occupational History   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

## 2022-08-20 ENCOUNTER — Ambulatory Visit: Payer: 59 | Admitting: Orthopaedic Surgery

## 2022-09-01 ENCOUNTER — Other Ambulatory Visit: Payer: Self-pay | Admitting: Hematology

## 2022-09-02 ENCOUNTER — Other Ambulatory Visit: Payer: Self-pay

## 2022-09-05 ENCOUNTER — Other Ambulatory Visit: Payer: Self-pay | Admitting: Internal Medicine

## 2022-09-05 DIAGNOSIS — F419 Anxiety disorder, unspecified: Secondary | ICD-10-CM

## 2022-09-05 DIAGNOSIS — F321 Major depressive disorder, single episode, moderate: Secondary | ICD-10-CM

## 2022-09-10 NOTE — Patient Instructions (Signed)
DUE TO COVID-19 ONLY TWO VISITORS  (aged 60 and older)  ARE ALLOWED TO COME WITH YOU AND STAY IN THE WAITING ROOM ONLY DURING PRE OP AND PROCEDURE.   **NO VISITORS ARE ALLOWED IN THE SHORT STAY AREA OR RECOVERY ROOM!!**  IF YOU WILL BE ADMITTED INTO THE HOSPITAL YOU ARE ALLOWED ONLY FOUR SUPPORT PEOPLE DURING VISITATION HOURS ONLY (7 AM -8PM)   The support person(s) must pass our screening, gel in and out, and wear a mask at all times, including in the patient's room. Patients must also wear a mask when staff or their support person are in the room. Visitors GUEST BADGE MUST BE WORN VISIBLY  One adult visitor may remain with you overnight and MUST be in the room by 8 P.M.     Your procedure is scheduled on: 09/19/22   Report to [facility] Main Entrance    Report to admitting at : 8:30 AM   Call this number if you have problems the morning of surgery [phone]   Do not eat food :After Midnight.   After Midnight you may have the following liquids until: 8:00 AM DAY OF SURGERY  Water Black Coffee (sugar ok, NO MILK/CREAM OR CREAMERS)  Tea (sugar ok, NO MILK/CREAM OR CREAMERS) regular and decaf                             Plain Jell-O (NO RED)                                           Fruit ices (not with fruit pulp, NO RED)                                     Popsicles (NO RED)                                                                  Juice: apple, WHITE grape, WHITE cranberry Sports drinks like Gatorade (NO RED)   The day of surgery:  Drink ONE (1) Pre-Surgery Clear Ensure at : 8:00 AM the morning of surgery. Drink in one sitting. Do not sip.  This drink was given to you during your hospital  pre-op appointment visit. Nothing else to drink after completing the  Pre-Surgery Clear Ensure or G2.          If you have questions, please contact your surgeon's office.  FOLLOW ANY ADDITIONAL PRE OP INSTRUCTIONS YOU RECEIVED FROM YOUR SURGEON'S OFFICE!!!   Oral  Hygiene is also important to reduce your risk of infection.                                    Remember - BRUSH YOUR TEETH THE MORNING OF SURGERY WITH YOUR REGULAR TOOTHPASTE  DENTURES WILL BE REMOVED PRIOR TO SURGERY PLEASE DO NOT APPLY "Poly grip" OR ADHESIVES!!!   Do NOT smoke after Midnight   Take these medicines the morning of surgery with  A SIP OF WATER: pregabalin,sertraline,amlodipine.Tylenol,meclizine as needed.                              You may not have any metal on your body including hair pins, jewelry, and body piercing             Do not wear make-up, lotions, powders, perfumes/cologne, or deodorant  Do not wear nail polish including gel and S&S, artificial/acrylic nails, or any other type of covering on natural nails including finger and toenails. If you have artificial nails, gel coating, etc. that needs to be removed by a nail salon please have this removed prior to surgery or surgery may need to be canceled/ delayed if the surgeon/ anesthesia feels like they are unable to be safely monitored.   Do not shave  48 hours prior to surgery.    Do not bring valuables to the hospital. [facility] IS NOT             RESPONSIBLE   FOR VALUABLES.   Contacts, glasses, or bridgework may not be worn into surgery.   Bring small overnight bag day of surgery.   DO NOT BRING YOUR HOME MEDICATIONS TO THE HOSPITAL. PHARMACY WILL DISPENSE MEDICATIONS LISTED ON YOUR MEDICATION LIST TO YOU DURING YOUR ADMISSION IN THE HOSPITAL!    Patients discharged on the day of surgery will not be allowed to drive home.  Someone NEEDS to stay with you for the first 24 hours after anesthesia.   Special Instructions: Bring a copy of your healthcare power of attorney and living will documents         the day of surgery if you haven't scanned them before.              Please read over the following fact sheets you were given: IF YOU HAVE QUESTIONS ABOUT YOUR PRE-OP INSTRUCTIONS PLEASE CALL  [phone]      Pre-operative 5 CHG Bath Instructions   You can play a key role in reducing the risk of infection after surgery. Your skin needs to be as free of germs as possible. You can reduce the number of germs on your skin by washing with CHG (chlorhexidine gluconate) soap before surgery. CHG is an antiseptic soap that kills germs and continues to kill germs even after washing.   DO NOT use if you have an allergy to chlorhexidine/CHG or antibacterial soaps. If your skin becomes reddened or irritated, stop using the CHG and notify one of our RNs at : [phone].   Please shower with the CHG soap starting 4 days before surgery using the following schedule:     Please keep in mind the following:  DO NOT shave, including legs and underarms, starting the day of your first shower.   You may shave your face at any point before/day of surgery.  Place clean sheets on your bed the day you start using CHG soap. Use a clean washcloth (not used since being washed) for each shower. DO NOT sleep with pets once you start using the CHG.   CHG Shower Instructions:  If you choose to wash your hair and private area, wash first with your normal shampoo/soap.  After you use shampoo/soap, rinse your hair and body thoroughly to remove shampoo/soap residue.  Turn the water OFF and apply about 3 tablespoons (45 ml) of CHG soap to a CLEAN washcloth.  Apply CHG soap ONLY FROM YOUR NECK  DOWN TO YOUR TOES (washing for 3-5 minutes)  DO NOT use CHG soap on face, private areas, open wounds, or sores.  Pay special attention to the area where your surgery is being performed.  If you are having back surgery, having someone wash your back for you may be helpful. Wait 2 minutes after CHG soap is applied, then you may rinse off the CHG soap.  Pat dry with a clean towel  Put on clean clothes/pajamas   If you choose to wear lotion, please use ONLY the CHG-compatible lotions on the back of this paper.      Additional instructions for the day of surgery: DO NOT APPLY any lotions, deodorants, cologne, or perfumes.   Put on clean/comfortable clothes.  Brush your teeth.  Ask your nurse before applying any prescription medications to the skin.    CHG Compatible Lotions   Aveeno Moisturizing lotion  Cetaphil Moisturizing Cream  Cetaphil Moisturizing Lotion  Clairol Herbal Essence Moisturizing Lotion, Dry Skin  Clairol Herbal Essence Moisturizing Lotion, Extra Dry Skin  Clairol Herbal Essence Moisturizing Lotion, Normal Skin  Curel Age Defying Therapeutic Moisturizing Lotion with Alpha Hydroxy  Curel Extreme Care Body Lotion  Curel Soothing Hands Moisturizing Hand Lotion  Curel Therapeutic Moisturizing Cream, Fragrance-Free  Curel Therapeutic Moisturizing Lotion, Fragrance-Free  Curel Therapeutic Moisturizing Lotion, Original Formula  Eucerin Daily Replenishing Lotion  Eucerin Dry Skin Therapy Plus Alpha Hydroxy Crme  Eucerin Dry Skin Therapy Plus Alpha Hydroxy Lotion  Eucerin Original Crme  Eucerin Original Lotion  Eucerin Plus Crme Eucerin Plus Lotion  Eucerin TriLipid Replenishing Lotion  Keri Anti-Bacterial Hand Lotion  Keri Deep Conditioning Original Lotion Dry Skin Formula Softly Scented  Keri Deep Conditioning Original Lotion, Fragrance Free Sensitive Skin Formula  Keri Lotion Fast Absorbing Fragrance Free Sensitive Skin Formula  Keri Lotion Fast Absorbing Softly Scented Dry Skin Formula  Keri Original Lotion  Keri Skin Renewal Lotion Keri Silky Smooth Lotion  Keri Silky Smooth Sensitive Skin Lotion  Nivea Body Creamy Conditioning Oil  Nivea Body Extra Enriched Lotion  Nivea Body Original Lotion  Nivea Body Sheer Moisturizing Lotion Nivea Crme  Nivea Skin Firming Lotion  NutraDerm 30 Skin Lotion  NutraDerm Skin Lotion  NutraDerm Therapeutic Skin Cream  NutraDerm Therapeutic Skin Lotion  ProShield Protective Hand Cream  Provon moisturizing lotion   Incentive  Spirometer  An incentive spirometer is a tool that can help keep your lungs clear and active. This tool measures how well you are filling your lungs with each breath. Taking long deep breaths may help reverse or decrease the chance of developing breathing (pulmonary) problems (especially infection) following: A long period of time when you are unable to move or be active. BEFORE THE PROCEDURE  If the spirometer includes an indicator to show your best effort, your nurse or respiratory therapist will set it to a desired goal. If possible, sit up straight or lean slightly forward. Try not to slouch. Hold the incentive spirometer in an upright position. INSTRUCTIONS FOR USE  Sit on the edge of your bed if possible, or sit up as far as you can in bed or on a chair. Hold the incentive spirometer in an upright position. Breathe out normally. Place the mouthpiece in your mouth and seal your lips tightly around it. Breathe in slowly and as deeply as possible, raising the piston or the ball toward the top of the column. Hold your breath for 3-5 seconds or for as long as possible. Allow the piston or ball  to fall to the bottom of the column. Remove the mouthpiece from your mouth and breathe out normally. Rest for a few seconds and repeat Steps 1 through 7 at least 10 times every 1-2 hours when you are awake. Take your time and take a few normal breaths between deep breaths. The spirometer may include an indicator to show your best effort. Use the indicator as a goal to work toward during each repetition. After each set of 10 deep breaths, practice coughing to be sure your lungs are clear. If you have an incision (the cut made at the time of surgery), support your incision when coughing by placing a pillow or rolled up towels firmly against it. Once you are able to get out of bed, walk around indoors and cough well. You may stop using the incentive spirometer when instructed by your caregiver.  RISKS AND  COMPLICATIONS Take your time so you do not get dizzy or light-headed. If you are in pain, you may need to take or ask for pain medication before doing incentive spirometry. It is harder to take a deep breath if you are having pain. AFTER USE Rest and breathe slowly and easily. It can be helpful to keep track of a log of your progress. Your caregiver can provide you with a simple table to help with this. If you are using the spirometer at home, follow these instructions: SEEK MEDICAL CARE IF:  You are having difficultly using the spirometer. You have trouble using the spirometer as often as instructed. Your pain medication is not giving enough relief while using the spirometer. You develop fever of 100.5 F (38.1 C) or higher. SEEK IMMEDIATE MEDICAL CARE IF:  You cough up bloody sputum that had not been present before. You develop fever of 102 F (38.9 C) or greater. You develop worsening pain at or near the incision site. MAKE SURE YOU:  Understand these instructions. Will watch your condition. Will get help right away if you are not doing well or get worse. Document Released: 05/19/2006 Document Revised: 03/31/2011 Document Reviewed: 07/20/2006 [facility] Patient Information 2014 [place], [place].   ________________________________________________________________________

## 2022-09-11 ENCOUNTER — Other Ambulatory Visit: Payer: Self-pay

## 2022-09-11 ENCOUNTER — Encounter (HOSPITAL_COMMUNITY)
Admission: RE | Admit: 2022-09-11 | Discharge: 2022-09-11 | Disposition: A | Discharge status: Home / Self Care | Payer: 59 | Source: Ambulatory Visit | Attending: Orthopaedic Surgery | Admitting: Orthopaedic Surgery

## 2022-09-11 ENCOUNTER — Encounter (HOSPITAL_COMMUNITY): Payer: Self-pay

## 2022-09-11 VITALS — BP 151/91 | HR 64 | Temp 98.3°F | Ht 64.0 in | Wt 223.0 lb

## 2022-09-11 DIAGNOSIS — M1611 Unilateral primary osteoarthritis, right hip: Secondary | ICD-10-CM | POA: Diagnosis not present

## 2022-09-11 DIAGNOSIS — Z01812 Encounter for preprocedural laboratory examination: Secondary | ICD-10-CM | POA: Diagnosis present

## 2022-09-11 DIAGNOSIS — Z01818 Encounter for other preprocedural examination: Secondary | ICD-10-CM

## 2022-09-11 DIAGNOSIS — I10 Essential (primary) hypertension: Secondary | ICD-10-CM | POA: Insufficient documentation

## 2022-09-11 DIAGNOSIS — Z8542 Personal history of malignant neoplasm of other parts of uterus: Secondary | ICD-10-CM | POA: Insufficient documentation

## 2022-09-11 HISTORY — DX: Chest pain, unspecified: R07.9

## 2022-09-11 HISTORY — DX: Anemia, unspecified: D64.9

## 2022-09-11 HISTORY — DX: Anxiety disorder, unspecified: F41.9

## 2022-09-11 HISTORY — DX: Depression, unspecified: F32.A

## 2022-09-11 HISTORY — DX: Prediabetes: R73.03

## 2022-09-11 HISTORY — DX: Unspecified osteoarthritis, unspecified site: M19.90

## 2022-09-11 LAB — COMPREHENSIVE METABOLIC PANEL
ALT: 22 U/L (ref 0–44)
AST: 21 U/L (ref 15–41)
Albumin: 4.2 g/dL (ref 3.5–5.0)
Alkaline Phosphatase: 112 U/L (ref 38–126)
Anion gap: 9 (ref 5–15)
BUN: 19 mg/dL (ref 6–20)
CO2: 25 mmol/L (ref 22–32)
Calcium: 9.2 mg/dL (ref 8.9–10.3)
Chloride: 105 mmol/L (ref 98–111)
Creatinine, Ser: 0.75 mg/dL (ref 0.44–1.00)
GFR, Estimated: >60 mL/min (ref >60)
Glucose, Bld: 95 mg/dL (ref 70–99)
Potassium: 4.2 mmol/L (ref 3.5–5.1)
Sodium: 139 mmol/L (ref 135–145)
Total Bilirubin: 0.5 mg/dL (ref 0.3–1.2)
Total Protein: 7.4 g/dL (ref 6.5–8.1)

## 2022-09-11 LAB — CBC
HCT: 42.4 % (ref 36.0–46.0)
Hemoglobin: 13.7 g/dL (ref 12.0–15.0)
MCH: 27.5 pg (ref 26.0–34.0)
MCHC: 32.3 g/dL (ref 30.0–36.0)
MCV: 85.1 fL (ref 80.0–100.0)
Platelets: 304 10*3/uL (ref 150–400)
RBC: 4.98 MIL/uL (ref 3.87–5.11)
RDW: 15.9 % — ABNORMAL HIGH (ref 11.5–15.5)
WBC: 8.7 10*3/uL (ref 4.0–10.5)
nRBC: 0 % (ref 0.0–0.2)

## 2022-09-11 LAB — SURGICAL PCR SCREEN
MRSA, PCR: NEGATIVE
Staphylococcus aureus: POSITIVE — AB

## 2022-09-11 NOTE — Progress Notes (Signed)
For Short Stay: COVID SWAB appointment date:  Bowel Prep reminder:   For Anesthesia: PCP - Billie Lade, MD  Cardiologist - Marjo Bicker, MD . Theron Arista: 04/10/22  Chest x-ray -  EKG - 08/07/22 Stress Test -  ECHO -  Cardiac Cath -  CT Coronary: 04/22/22 Pacemaker/ICD device last checked: Pacemaker orders received: Device Rep notified:  Spinal Cord Stimulator: N/A  Sleep Study -  CPAP -   Fasting Blood Sugar - N/A Checks Blood Sugar __0___ times a day Date and result of last Hgb A1c- 6.3: 07/09/22  Last dose of GLP1 agonist- N/A GLP1 instructions:   Last dose of SGLT-2 inhibitors- N/A SGLT-2 instructions:   Blood Thinner Instructions:N/A Aspirin Instructions: Last Dose:  Activity level: Can go up a flight of stairs and activities of daily living without stopping and without chest pain and/or shortness of breath   Able to exercise without chest pain and/or shortness of breath   Unable to go up a flight of stairs due to right hip pain.  Anesthesia review: Hx: HTN,Chest pain  Patient denies shortness of breath, fever, cough and chest pain at PAT appointment   Patient verbalized understanding of instructions that were given to them at the PAT appointment. Patient was also instructed that they will need to review over the PAT instructions again at home before surgery.

## 2022-09-12 NOTE — Progress Notes (Signed)
PCR: + STAPH °

## 2022-09-12 NOTE — Progress Notes (Signed)
Anesthesia Chart Review   Case: [id number] Date/Time: 09/19/22 1045   Procedure: RIGHT TOTAL HIP ARTHROPLASTY ANTERIOR APPROACH (Right: Hip)   Anesthesia type: Spinal   Pre-op diagnosis: right hip osteoarthritis   Location: [facility] ROOM 10 / [facility]   Surgeons: Kathryne Hitch, MD       DISCUSSION:62 y.o. never smoker with h/o HTN, endometrial cancer, right hip OA scheduled for above procedure 09/19/2022 with Dr. Doneen Poisson.   Pt referred to cardiology for evaluation of chest heaviness.  Low risk stress test 04/14/2022.   Last seen by oncology 06/02/2022. Per OV note, "CTAP on 05/26/2022: Stable small pelvic nodes in the mesenteric nodules in the pelvis with no new lesions. - She has not been receiving Taxol since 08/22/2021. - She has been doing very well.  I have recommended follow-up in 4 months with repeat CTAP with contrast and CA125.  After that I will switch her to every 36-month CT scan and every 46-month visit with tumor marker."   VS: BP (!) 151/91   Pulse 64   Temp 36.8 C (Oral)   Ht 5\' 4"  (1.626 m)   Wt 101.2 kg   SpO2 95%   BMI 38.28 kg/m   PROVIDERS: Billie Lade, MD is PCP   Cardiologist - Mallipeddi, Orion Modest, MD  LABS: Labs reviewed: Acceptable for surgery. (all labs ordered are listed, but only abnormal results are displayed)  Labs Reviewed  SURGICAL PCR SCREEN - Abnormal; Notable for the following components:      Result Value   Staphylococcus aureus POSITIVE (*)    All other components within normal limits  CBC - Abnormal; Notable for the following components:   RDW 15.9 (*)    All other components within normal limits  COMPREHENSIVE METABOLIC PANEL  TYPE AND SCREEN     IMAGES:   EKG:   CV: Myocardial Perfusion 04/14/2022     Findings are equivocal. The study is low risk.   No ST deviation was noted.   LV perfusion is equivocal.  Small, mild intensity, anteroapical defect that is partially reversible and most consistent with  variable soft tissue attenuation, cannot completely exclude a very small ischemic territory.  Calculated TID ratio does not look to be accurate with re-registration of images.   Left ventricular function is normal. Nuclear stress EF: 67 %.   Overall low risk study with suspected variable soft tissue attenuation affecting the LV apex and LVEF 67%. Past Medical History:  Diagnosis Date   Allergy    Anemia    Anxiety    Arthritis    Cancer (HCC)    [employer] 09/05/2019   Cancer of skin of back    Chest pain    Depression    Elevated blood pressure reading    Endometrial cancer (HCC)    Hyperlipidemia    Hypertension    PMB (postmenopausal bleeding)    Port-A-Cath in place 12/24/2018   Pre-diabetes     Past Surgical History:  Procedure Laterality Date   ABDOMINAL HYSTERECTOMY N/A    [employer] 09/05/2019   IR IMAGING GUIDED PORT INSERTION  12/28/2018   IR RADIOLOGIST EVAL & MGMT  06/02/2018   IR RADIOLOGIST EVAL & MGMT  06/16/2018   NECK SURGERY  2000   spinal surgery , titanium rod in place    ROBOTIC ASSISTED TOTAL HYSTERECTOMY WITH BILATERAL SALPINGO OOPHERECTOMY N/A 04/13/2018   Procedure: XI ROBOTIC ASSISTED TOTAL HYSTERECTOMY WITH BILATERAL SALPINGO OOPHORECTOMY;  Surgeon: Adolphus Birchwood, MD;  Location:  [facility];  Service: Gynecology;  Laterality: N/A;   ROBOTIC PELVIC AND PARA-AORTIC LYMPH NODE DISSECTION N/A 04/13/2018   Procedure: XI ROBOTIC PELVIC LYMPHADECTOMY AND PARA-AORTIC LYMPH NODE DISSECTION;  Surgeon: Adolphus Birchwood, MD;  Location: [facility];  Service: Gynecology;  Laterality: N/A;    MEDICATIONS:  acetaminophen (TYLENOL) 500 MG tablet   amLODipine (NORVASC) 5 MG tablet   atorvastatin (LIPITOR) 20 MG tablet   Cholecalciferol (VITAMIN D3) 25 MCG (1000 UT) CAPS   EPINEPHrine 0.3 mg/0.3 mL IJ SOAJ injection   ibuprofen (ADVIL) 200 MG tablet   lidocaine-prilocaine (EMLA) cream   MAGnesium-Oxide 400 (240 Mg) MG tablet   meclizine (ANTIVERT) 25 MG tablet   nitroGLYCERIN  (NITROSTAT) 0.4 MG SL tablet   oxyCODONE-acetaminophen (PERCOCET) 5-325 MG tablet   PACLITAXEL IV   pregabalin (LYRICA) 75 MG capsule   prochlorperazine (COMPAZINE) 10 MG tablet   sertraline (ZOLOFT) 100 MG tablet   No current facility-administered medications for this encounter.    diphenhydrAMINE (BENADRYL) 50 MG/ML injection   palonosetron (ALOXI) 0.25 MG/5ML injection     Jodell Cipro Ward, PA-C [facility]Surgical [facility] [phone]

## 2022-09-16 NOTE — Telephone Encounter (Signed)
Pt requesting handicap sticker form

## 2022-09-18 DIAGNOSIS — M1611 Unilateral primary osteoarthritis, right hip: Secondary | ICD-10-CM | POA: Insufficient documentation

## 2022-09-18 NOTE — H&P (Signed)
TOTAL HIP ADMISSION H&P  Patient is admitted for right total hip arthroplasty.  Subjective:  Chief Complaint: right hip pain  HPI: Jessica [name], 62 y.o. female, has a history of pain and functional disability in the right hip(s) due to arthritis and patient has failed non-surgical conservative treatments for greater than 12 weeks to include NSAID's and/or analgesics, use of assistive devices, and activity modification.  Onset of symptoms was abrupt starting 1 years ago with gradually worsening course since that time.The patient noted no past surgery on the right hip(s).  Patient currently rates pain in the right hip at 10 out of 10 with activity. Patient has night pain, worsening of pain with activity and weight bearing, trendelenberg gait, pain that interfers with activities of daily living, and pain with passive range of motion. Patient has evidence of subchondral cysts, subchondral sclerosis, periarticular osteophytes, and joint space narrowing by imaging studies. This condition presents safety issues increasing the risk of falls.  There is no current active infection.  Patient Active Problem List   Diagnosis Date Noted   Unilateral primary osteoarthritis, right hip 09/18/2022   Acute right hip pain 07/09/2022   Cardiac chest pain 04/10/2022   Cholelithiasis 12/20/2021   Vitamin D deficiency 06/18/2021   Prediabetes 06/18/2021   Annual physical exam 06/18/2021   Impaired vision in both eyes 06/18/2021   Breast tenderness in female 06/18/2021   Neck pain, acute 02/11/2021   Headache 02/11/2021   Morbid obesity (HCC) 01/28/2021   Acute bronchitis 01/28/2021   Insomnia 01/28/2021   SIRS (systemic inflammatory response syndrome) (HCC) 12/07/2020   Hypotension    Peripheral neuropathy due to chemotherapy (HCC) 06/27/2020   Anemia due to antineoplastic chemotherapy 06/27/2020   Depression, major, single episode, moderate (HCC) 09/07/2019   Anxiety 09/07/2019   Port-A-Cath in place  12/24/2018   Goals of care, counseling/discussion 12/23/2018   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HCC) 12/14/2018   Elevated LFTs 05/05/2018   Hyperlipidemia 04/27/2018   Essential hypertension 04/26/2018   Endometrial cancer (HCC) 03/31/2018   H/O total hysterectomy 03/2018   Past Medical History:  Diagnosis Date   Allergy    Anemia    Anxiety    Arthritis    Cancer (HCC)    [employer] 09/05/2019   Cancer of skin of back    Chest pain    Depression    Elevated blood pressure reading    Endometrial cancer (HCC)    Hyperlipidemia    Hypertension    PMB (postmenopausal bleeding)    Port-A-Cath in place 12/24/2018   Pre-diabetes     Past Surgical History:  Procedure Laterality Date   ABDOMINAL HYSTERECTOMY N/A    [employer] 09/05/2019   IR IMAGING GUIDED PORT INSERTION  12/28/2018   IR RADIOLOGIST EVAL & MGMT  06/02/2018   IR RADIOLOGIST EVAL & MGMT  06/16/2018   NECK SURGERY  2000   spinal surgery , titanium rod in place    ROBOTIC ASSISTED TOTAL HYSTERECTOMY WITH BILATERAL SALPINGO OOPHERECTOMY N/A 04/13/2018   Procedure: XI ROBOTIC ASSISTED TOTAL HYSTERECTOMY WITH BILATERAL SALPINGO OOPHORECTOMY;  Surgeon: Adolphus Birchwood, MD;  Location: [facility];  Service: Gynecology;  Laterality: N/A;   ROBOTIC PELVIC AND PARA-AORTIC LYMPH NODE DISSECTION N/A 04/13/2018   Procedure: XI ROBOTIC PELVIC LYMPHADECTOMY AND PARA-AORTIC LYMPH NODE DISSECTION;  Surgeon: Adolphus Birchwood, MD;  Location: [facility];  Service: Gynecology;  Laterality: N/A;    No current facility-administered medications for this encounter.   Current Outpatient Medications  Medication Sig Dispense Refill Last Dose   acetaminophen (TYLENOL) 500 MG tablet Take 1,000 mg by mouth every 6 (six) hours as needed (pain.).      amLODipine (NORVASC) 5 MG tablet Take 1 tablet by mouth once daily 90 tablet 0    atorvastatin (LIPITOR) 20 MG tablet Take 1 tablet by mouth once daily 90 tablet 0    Cholecalciferol (VITAMIN D3) 25  MCG (1000 UT) CAPS Take 1 capsule (1,000 Units total) by mouth daily. 60 capsule 2    ibuprofen (ADVIL) 200 MG tablet Take 400 mg by mouth every 8 (eight) hours as needed (pain.).      lidocaine-prilocaine (EMLA) cream Apply to port site prior to use (Patient taking differently: Apply 1 Application topically as needed (prior to port being accessed.).) 30 g 0    MAGnesium-Oxide 400 (240 Mg) MG tablet Take 1 tablet by mouth twice daily 60 tablet 6    meclizine (ANTIVERT) 25 MG tablet Take 1 tablet (25 mg total) by mouth 3 (three) times daily as needed for dizziness. 30 tablet 0    nitroGLYCERIN (NITROSTAT) 0.4 MG SL tablet Place 1 tablet (0.4 mg total) under the tongue every 5 (five) minutes as needed for chest pain. 25 tablet 3    oxyCODONE-acetaminophen (PERCOCET) 5-325 MG tablet Take 1 tablet by mouth every 12 (twelve) hours as needed (pain). 60 tablet 0    pregabalin (LYRICA) 75 MG capsule TAKE 1 CAPSULE BY MOUTH THREE TIMES DAILY 90 capsule 3    prochlorperazine (COMPAZINE) 10 MG tablet Take 1 tablet (10 mg total) by mouth every 6 (six) hours as needed (Nausea or vomiting). 60 tablet 3    EPINEPHrine 0.3 mg/0.3 mL IJ SOAJ injection Inject 0.3 mg into the muscle as needed for anaphylaxis. 1 each 0    PACLITAXEL IV Inject into the vein every 21 ( twenty-one) days.      sertraline (ZOLOFT) 100 MG tablet Take 1 tablet by mouth once daily 30 tablet 0    Facility-Administered Medications Ordered in Other Encounters  Medication Dose Route Frequency Provider Last Rate Last Admin   diphenhydrAMINE (BENADRYL) 50 MG/ML injection            palonosetron (ALOXI) 0.25 MG/5ML injection            Allergies  Allergen Reactions   Carboplatin Other (See Comments)    "body on fire and abdominal pain"   Nickel Rash    itchy    Social History   Tobacco Use   Smoking status: Never   Smokeless tobacco: Never  Substance Use Topics   Alcohol use: Yes    Alcohol/week: 4.0 standard drinks of alcohol     Types: 4 Glasses of wine per week    Comment: weekends    Family History  Problem Relation Age of Onset   Hypertension Mother    Breast cancer Mother    Hypertension Father    Heart disease Father    Cancer Father        lung   Cancer Sister        ovarian cancer   Melanoma Sister      Review of Systems  Objective:  Physical Exam Vitals reviewed.  Constitutional:      Appearance: Normal appearance. She is obese.  HENT:     Head: Normocephalic and atraumatic.  Eyes:     Extraocular Movements: Extraocular movements intact.     Pupils: Pupils are equal, round, and reactive to light.  Cardiovascular:     Rate and Rhythm: Normal rate.     Pulses: Normal pulses.  Pulmonary:     Effort: Pulmonary effort is normal.  Musculoskeletal:     Cervical back: Normal range of motion and neck supple.     Right hip: Tenderness and bony tenderness present. Decreased range of motion. Decreased strength.  Neurological:     Mental Status: She is alert and oriented to person, place, and time.  Psychiatric:        Behavior: Behavior normal.     Vital signs in last 24 hours:    Labs:   Estimated body mass index is 38.28 kg/m as calculated from the following:   Height as of 09/11/22: 5\' 4"  (1.626 m).   Weight as of 09/11/22: 101.2 kg.   Imaging Review Plain radiographs demonstrate severe degenerative joint disease of the right hip(s). The bone quality appears to be good for age and reported activity level.      Assessment/Plan:  End stage arthritis, right hip(s)  The patient history, physical examination, clinical judgement of the provider and imaging studies are consistent with end stage degenerative joint disease of the right hip(s) and total hip arthroplasty is deemed medically necessary. The treatment options including medical management, injection therapy, arthroscopy and arthroplasty were discussed at length. The risks and benefits of total hip arthroplasty were presented  and reviewed. The risks due to aseptic loosening, infection, stiffness, dislocation/subluxation,  thromboembolic complications and other imponderables were discussed.  The patient acknowledged the explanation, agreed to proceed with the plan and consent was signed. Patient is being admitted for inpatient treatment for surgery, pain control, PT, OT, prophylactic antibiotics, VTE prophylaxis, progressive ambulation and ADL's and discharge planning.The patient is planning to be discharged home with home health services

## 2022-09-19 ENCOUNTER — Observation Stay (HOSPITAL_COMMUNITY): Admission: RE | Admit: 2022-09-19 | Payer: 59 | Source: Home / Self Care | Admitting: Orthopaedic Surgery

## 2022-09-19 ENCOUNTER — Ambulatory Visit (HOSPITAL_COMMUNITY): Payer: 59 | Admitting: Physician Assistant

## 2022-09-19 ENCOUNTER — Encounter (HOSPITAL_COMMUNITY)
Admission: RE | Disposition: A | Discharge status: Home / Self Care | Payer: Self-pay | Source: Home / Self Care | Attending: Orthopaedic Surgery

## 2022-09-19 ENCOUNTER — Encounter (HOSPITAL_COMMUNITY): Payer: Self-pay | Admitting: Orthopaedic Surgery

## 2022-09-19 ENCOUNTER — Ambulatory Visit (HOSPITAL_COMMUNITY): Payer: 59

## 2022-09-19 ENCOUNTER — Observation Stay (HOSPITAL_COMMUNITY): Payer: 59

## 2022-09-19 ENCOUNTER — Ambulatory Visit (HOSPITAL_BASED_OUTPATIENT_CLINIC_OR_DEPARTMENT_OTHER): Payer: 59 | Admitting: Registered Nurse

## 2022-09-19 ENCOUNTER — Other Ambulatory Visit: Payer: Self-pay

## 2022-09-19 DIAGNOSIS — Z79899 Other long term (current) drug therapy: Secondary | ICD-10-CM | POA: Diagnosis not present

## 2022-09-19 DIAGNOSIS — I10 Essential (primary) hypertension: Secondary | ICD-10-CM | POA: Insufficient documentation

## 2022-09-19 DIAGNOSIS — Z8542 Personal history of malignant neoplasm of other parts of uterus: Secondary | ICD-10-CM | POA: Diagnosis not present

## 2022-09-19 DIAGNOSIS — M1611 Unilateral primary osteoarthritis, right hip: Secondary | ICD-10-CM

## 2022-09-19 DIAGNOSIS — Z96641 Presence of right artificial hip joint: Secondary | ICD-10-CM

## 2022-09-19 DIAGNOSIS — Z85828 Personal history of other malignant neoplasm of skin: Secondary | ICD-10-CM | POA: Diagnosis not present

## 2022-09-19 DIAGNOSIS — C541 Malignant neoplasm of endometrium: Secondary | ICD-10-CM

## 2022-09-19 HISTORY — PX: TOTAL HIP ARTHROPLASTY: SHX124

## 2022-09-19 LAB — TYPE AND SCREEN
ABO/RH(D): B POS
Antibody Screen: NEGATIVE

## 2022-09-19 LAB — ABO/RH: ABO/RH(D): B POS

## 2022-09-19 SURGERY — ARTHROPLASTY, HIP, TOTAL, ANTERIOR APPROACH
Anesthesia: Monitor Anesthesia Care | Site: Hip | Laterality: Right

## 2022-09-19 MED ORDER — ALUM & MAG HYDROXIDE-SIMETH 200-200-20 MG/5ML PO SUSP: 30.0000 mL | ORAL | Status: DC | PRN

## 2022-09-19 MED ORDER — SODIUM CHLORIDE 0.9 % IV SOLN: INTRAVENOUS | Status: DC

## 2022-09-19 MED ORDER — MIDAZOLAM HCL 2 MG/2ML IJ SOLN
INTRAMUSCULAR | Status: AC
  Filled 2022-09-19: qty 2

## 2022-09-19 MED ORDER — PROCHLORPERAZINE MALEATE 10 MG PO TABS: 10.0000 mg | ORAL_TABLET | Freq: Four times a day (QID) | ORAL | Status: DC | PRN

## 2022-09-19 MED ORDER — VITAMIN D 25 MCG (1000 UNIT) PO TABS
1000.0000 [IU] | ORAL_TABLET | Freq: Every day | ORAL | Status: DC
  Administered 2022-09-20 – 2022-09-21 (×2): 1000 [IU] via ORAL
  Filled 2022-09-19 (×3): qty 1

## 2022-09-19 MED ORDER — ACETAMINOPHEN 10 MG/ML IV SOLN
INTRAVENOUS | Status: AC
  Filled 2022-09-19: qty 100

## 2022-09-19 MED ORDER — FENTANYL CITRATE PF 50 MCG/ML IJ SOSY
25.0000 ug | PREFILLED_SYRINGE | INTRAMUSCULAR | Status: DC | PRN
  Administered 2022-09-19: 25 ug via INTRAVENOUS

## 2022-09-19 MED ORDER — MAGNESIUM OXIDE -MG SUPPLEMENT 400 (240 MG) MG PO TABS
400.0000 mg | ORAL_TABLET | Freq: Two times a day (BID) | ORAL | Status: DC
  Administered 2022-09-19 – 2022-09-21 (×4): 400 mg via ORAL
  Filled 2022-09-19 (×4): qty 1

## 2022-09-19 MED ORDER — PROPOFOL 500 MG/50ML IV EMUL
INTRAVENOUS | Status: DC | PRN
  Administered 2022-09-19: 40 ug/kg/min via INTRAVENOUS

## 2022-09-19 MED ORDER — SERTRALINE HCL 100 MG PO TABS
100.0000 mg | ORAL_TABLET | Freq: Every day | ORAL | Status: DC
  Administered 2022-09-20 – 2022-09-21 (×2): 100 mg via ORAL
  Filled 2022-09-19 (×2): qty 1

## 2022-09-19 MED ORDER — ONDANSETRON HCL 4 MG/2ML IJ SOLN
INTRAMUSCULAR | Status: AC
  Filled 2022-09-19: qty 2

## 2022-09-19 MED ORDER — OXYCODONE HCL 5 MG PO TABS
5.0000 mg | ORAL_TABLET | Freq: Once | ORAL | Status: AC | PRN
  Administered 2022-09-19: 5 mg via ORAL

## 2022-09-19 MED ORDER — OXYCODONE HCL 5 MG PO TABS
ORAL_TABLET | ORAL | Status: AC
  Filled 2022-09-19: qty 1

## 2022-09-19 MED ORDER — MENTHOL 3 MG MT LOZG: 1.0000 | LOZENGE | OROMUCOSAL | Status: DC | PRN

## 2022-09-19 MED ORDER — CEFAZOLIN SODIUM-DEXTROSE 1-4 GM/50ML-% IV SOLN
1.0000 g | Freq: Four times a day (QID) | INTRAVENOUS | Status: AC
  Administered 2022-09-19 – 2022-09-20 (×2): 1 g via INTRAVENOUS
  Filled 2022-09-19 (×3): qty 50

## 2022-09-19 MED ORDER — ONDANSETRON HCL 4 MG/2ML IJ SOLN
INTRAMUSCULAR | Status: DC | PRN
  Administered 2022-09-19: 4 mg via INTRAVENOUS

## 2022-09-19 MED ORDER — EPHEDRINE SULFATE-NACL 50-0.9 MG/10ML-% IV SOSY
PREFILLED_SYRINGE | INTRAVENOUS | Status: DC | PRN
  Administered 2022-09-19: 5 mg via INTRAVENOUS

## 2022-09-19 MED ORDER — FENTANYL CITRATE (PF) 100 MCG/2ML IJ SOLN
INTRAMUSCULAR | Status: DC | PRN
  Administered 2022-09-19: 50 ug via INTRAVENOUS

## 2022-09-19 MED ORDER — FENTANYL CITRATE PF 50 MCG/ML IJ SOSY
PREFILLED_SYRINGE | INTRAMUSCULAR | Status: AC
  Administered 2022-09-19: 25 ug via INTRAVENOUS
  Filled 2022-09-19: qty 1

## 2022-09-19 MED ORDER — DIPHENHYDRAMINE HCL 12.5 MG/5ML PO ELIX: 12.5000 mg | ORAL_SOLUTION | ORAL | Status: DC | PRN

## 2022-09-19 MED ORDER — ORAL CARE MOUTH RINSE: 15.0000 mL | Freq: Once | OROMUCOSAL | Status: AC

## 2022-09-19 MED ORDER — PREGABALIN 75 MG PO CAPS
75.0000 mg | ORAL_CAPSULE | Freq: Three times a day (TID) | ORAL | Status: DC
  Administered 2022-09-19 – 2022-09-21 (×5): 75 mg via ORAL
  Filled 2022-09-19 (×5): qty 1

## 2022-09-19 MED ORDER — METOCLOPRAMIDE HCL 5 MG PO TABS: 5.0000 mg | ORAL_TABLET | Freq: Three times a day (TID) | ORAL | Status: DC | PRN

## 2022-09-19 MED ORDER — BUPIVACAINE HCL (PF) 0.5 % IJ SOLN
INTRAMUSCULAR | Status: AC
  Filled 2022-09-19: qty 30

## 2022-09-19 MED ORDER — ACETAMINOPHEN 500 MG PO TABS: 1000.0000 mg | ORAL_TABLET | Freq: Once | ORAL | Status: DC | PRN

## 2022-09-19 MED ORDER — METHOCARBAMOL 500 MG PO TABS
500.0000 mg | ORAL_TABLET | Freq: Four times a day (QID) | ORAL | Status: DC | PRN
  Administered 2022-09-19 – 2022-09-21 (×4): 500 mg via ORAL
  Filled 2022-09-19 (×4): qty 1

## 2022-09-19 MED ORDER — PANTOPRAZOLE SODIUM 40 MG PO TBEC
40.0000 mg | DELAYED_RELEASE_TABLET | Freq: Every day | ORAL | Status: DC
  Administered 2022-09-19 – 2022-09-21 (×3): 40 mg via ORAL
  Filled 2022-09-19 (×3): qty 1

## 2022-09-19 MED ORDER — LACTATED RINGERS IV SOLN: INTRAVENOUS | Status: DC

## 2022-09-19 MED ORDER — DEXAMETHASONE SODIUM PHOSPHATE 10 MG/ML IJ SOLN
INTRAMUSCULAR | Status: DC | PRN
  Administered 2022-09-19: 8 mg via INTRAVENOUS

## 2022-09-19 MED ORDER — EPHEDRINE 5 MG/ML INJ
INTRAVENOUS | Status: AC
  Filled 2022-09-19: qty 5

## 2022-09-19 MED ORDER — OXYCODONE HCL 5 MG PO TABS
5.0000 mg | ORAL_TABLET | ORAL | Status: DC | PRN
  Administered 2022-09-19 – 2022-09-20 (×3): 10 mg via ORAL
  Administered 2022-09-20: 5 mg via ORAL
  Administered 2022-09-20: 10 mg via ORAL
  Administered 2022-09-20: 5 mg via ORAL
  Administered 2022-09-21 (×2): 10 mg via ORAL
  Filled 2022-09-19 (×2): qty 2
  Filled 2022-09-19: qty 1
  Filled 2022-09-19 (×5): qty 2

## 2022-09-19 MED ORDER — HYDROMORPHONE HCL 2 MG PO TABS: 2.0000 mg | ORAL_TABLET | ORAL | Status: DC | PRN

## 2022-09-19 MED ORDER — MIDAZOLAM HCL 5 MG/5ML IJ SOLN
INTRAMUSCULAR | Status: DC | PRN
  Administered 2022-09-19: 2 mg via INTRAVENOUS

## 2022-09-19 MED ORDER — PHENYLEPHRINE HCL-NACL 20-0.9 MG/250ML-% IV SOLN
INTRAVENOUS | Status: DC | PRN
Start: 2022-09-19 — End: 2022-09-19
  Administered 2022-09-19: 25 ug/min via INTRAVENOUS

## 2022-09-19 MED ORDER — HYDROMORPHONE HCL 1 MG/ML IJ SOLN
0.5000 mg | INTRAMUSCULAR | Status: DC | PRN
  Administered 2022-09-20: 1 mg via INTRAVENOUS
  Filled 2022-09-19: qty 1

## 2022-09-19 MED ORDER — PHENOL 1.4 % MT LIQD: 1.0000 | OROMUCOSAL | Status: DC | PRN

## 2022-09-19 MED ORDER — BUPIVACAINE HCL (PF) 0.5 % IJ SOLN
INTRAMUSCULAR | Status: DC | PRN
Start: 2022-09-19 — End: 2022-09-19
  Administered 2022-09-19: 3 mL via INTRATHECAL

## 2022-09-19 MED ORDER — METHOCARBAMOL 500 MG IVPB - SIMPLE MED: 500.0000 mg | Freq: Four times a day (QID) | INTRAVENOUS | Status: DC | PRN

## 2022-09-19 MED ORDER — PROPOFOL 1000 MG/100ML IV EMUL
INTRAVENOUS | Status: AC
  Filled 2022-09-19: qty 100

## 2022-09-19 MED ORDER — METHOCARBAMOL 500 MG IVPB - SIMPLE MED
INTRAVENOUS | Status: AC
  Administered 2022-09-19: 500 mg via INTRAVENOUS
  Filled 2022-09-19: qty 55

## 2022-09-19 MED ORDER — ACETAMINOPHEN 325 MG PO TABS
325.0000 mg | ORAL_TABLET | Freq: Four times a day (QID) | ORAL | Status: DC | PRN
  Administered 2022-09-21: 650 mg via ORAL
  Filled 2022-09-19: qty 2

## 2022-09-19 MED ORDER — METOCLOPRAMIDE HCL 5 MG/ML IJ SOLN: 5.0000 mg | Freq: Three times a day (TID) | INTRAMUSCULAR | Status: DC | PRN

## 2022-09-19 MED ORDER — OXYCODONE HCL 5 MG/5ML PO SOLN: 5.0000 mg | Freq: Once | ORAL | Status: AC | PRN

## 2022-09-19 MED ORDER — ONDANSETRON HCL 4 MG/2ML IJ SOLN: 4.0000 mg | Freq: Four times a day (QID) | INTRAMUSCULAR | Status: DC | PRN

## 2022-09-19 MED ORDER — ONDANSETRON HCL 4 MG PO TABS: 4.0000 mg | ORAL_TABLET | Freq: Four times a day (QID) | ORAL | Status: DC | PRN

## 2022-09-19 MED ORDER — AMLODIPINE BESYLATE 5 MG PO TABS
5.0000 mg | ORAL_TABLET | Freq: Every day | ORAL | Status: DC
  Administered 2022-09-20 – 2022-09-21 (×2): 5 mg via ORAL
  Filled 2022-09-19 (×2): qty 1

## 2022-09-19 MED ORDER — ATORVASTATIN CALCIUM 20 MG PO TABS
20.0000 mg | ORAL_TABLET | Freq: Every day | ORAL | Status: DC
  Administered 2022-09-20 – 2022-09-21 (×2): 20 mg via ORAL
  Filled 2022-09-19 (×2): qty 1

## 2022-09-19 MED ORDER — DEXAMETHASONE SODIUM PHOSPHATE 10 MG/ML IJ SOLN
INTRAMUSCULAR | Status: AC
  Filled 2022-09-19: qty 1

## 2022-09-19 MED ORDER — CHLORHEXIDINE GLUCONATE 0.12 % MT SOLN
15.0000 mL | Freq: Once | OROMUCOSAL | Status: AC
  Administered 2022-09-19: 15 mL via OROMUCOSAL

## 2022-09-19 MED ORDER — ACETAMINOPHEN 160 MG/5ML PO SOLN: 1000.0000 mg | Freq: Once | ORAL | Status: DC | PRN

## 2022-09-19 MED ORDER — MECLIZINE HCL 25 MG PO TABS: 25.0000 mg | ORAL_TABLET | Freq: Three times a day (TID) | ORAL | Status: DC | PRN

## 2022-09-19 MED ORDER — ASPIRIN 81 MG PO CHEW
81.0000 mg | CHEWABLE_TABLET | Freq: Two times a day (BID) | ORAL | Status: DC
  Administered 2022-09-19 – 2022-09-21 (×4): 81 mg via ORAL
  Filled 2022-09-19 (×4): qty 1

## 2022-09-19 MED ORDER — FENTANYL CITRATE (PF) 100 MCG/2ML IJ SOLN
INTRAMUSCULAR | Status: AC
  Filled 2022-09-19: qty 2

## 2022-09-19 MED ORDER — ACETAMINOPHEN 10 MG/ML IV SOLN
1000.0000 mg | Freq: Once | INTRAVENOUS | Status: DC | PRN
  Administered 2022-09-19: 1000 mg via INTRAVENOUS

## 2022-09-19 MED ORDER — CEFAZOLIN SODIUM-DEXTROSE 2-4 GM/100ML-% IV SOLN
2.0000 g | INTRAVENOUS | Status: AC
  Administered 2022-09-19: 2 g via INTRAVENOUS
  Filled 2022-09-19: qty 100

## 2022-09-19 MED ORDER — POVIDONE-IODINE 10 % EX SWAB: 2.0000 | Freq: Once | CUTANEOUS | Status: DC

## 2022-09-19 MED ORDER — TRANEXAMIC ACID-NACL 1000-0.7 MG/100ML-% IV SOLN
1000.0000 mg | INTRAVENOUS | Status: AC
  Administered 2022-09-19: 1000 mg via INTRAVENOUS
  Filled 2022-09-19: qty 100

## 2022-09-19 MED ORDER — DOCUSATE SODIUM 100 MG PO CAPS
100.0000 mg | ORAL_CAPSULE | Freq: Two times a day (BID) | ORAL | Status: DC
  Administered 2022-09-19 – 2022-09-21 (×4): 100 mg via ORAL
  Filled 2022-09-19 (×4): qty 1

## 2022-09-19 SURGICAL SUPPLY — 45 items
APL SKNCLS STERI-STRIP NONHPOA (GAUZE/BANDAGES/DRESSINGS)
BAG COUNTER SPONGE SURGICOUNT (BAG) ×1 IMPLANT
BAG SPEC THK2 15X12 ZIP CLS (MISCELLANEOUS)
BAG SPNG CNTER NS LX DISP (BAG) ×1
BAG ZIPLOCK 12X15 (MISCELLANEOUS) IMPLANT
BALL HIP CERAMIC (Hips) IMPLANT
BENZOIN TINCTURE PRP APPL 2/3 (GAUZE/BANDAGES/DRESSINGS) IMPLANT
BLADE SAW SGTL 18X1.27X75 (BLADE) ×1 IMPLANT
COVER PERINEAL POST (MISCELLANEOUS) ×1 IMPLANT
COVER SURGICAL LIGHT HANDLE (MISCELLANEOUS) ×1 IMPLANT
CUP SECTOR GRIPTON 50MM (Cup) IMPLANT
DRAPE FOOT SWITCH (DRAPES) ×1 IMPLANT
DRAPE INCISE IOBAN 66X45 STRL (DRAPES) IMPLANT
DRAPE STERI IOBAN 125X83 (DRAPES) ×1 IMPLANT
DRAPE U-SHAPE 47X51 STRL (DRAPES) ×2 IMPLANT
DRSG AQUACEL AG ADV 3.5X10 (GAUZE/BANDAGES/DRESSINGS) ×1 IMPLANT
DURAPREP 26ML APPLICATOR (WOUND CARE) ×1 IMPLANT
ELECT REM PT RETURN 15FT ADLT (MISCELLANEOUS) ×1 IMPLANT
FEM STEM 12/14 TAPER SZ 4 HIP (Orthopedic Implant) ×1 IMPLANT
FEMORAL STEM 12/14 TPR SZ4 HIP (Orthopedic Implant) IMPLANT
GAUZE XEROFORM 1X8 LF (GAUZE/BANDAGES/DRESSINGS) IMPLANT
GLOVE BIO SURGEON STRL SZ7.5 (GLOVE) ×1 IMPLANT
GLOVE BIOGEL PI IND STRL 8 (GLOVE) ×2 IMPLANT
GLOVE ECLIPSE 8.0 STRL XLNG CF (GLOVE) ×1 IMPLANT
GOWN STRL REUS W/ TWL XL LVL3 (GOWN DISPOSABLE) ×2 IMPLANT
GOWN STRL REUS W/TWL XL LVL3 (GOWN DISPOSABLE) ×2
HANDPIECE INTERPULSE COAX TIP (DISPOSABLE) ×1
HIP BALL CERAMIC (Hips) ×1 IMPLANT
HOLDER FOLEY CATH W/STRAP (MISCELLANEOUS) ×1 IMPLANT
KIT TURNOVER KIT A (KITS) IMPLANT
LINER ACET PNNCL PLUS4 NEUTRAL (Hips) IMPLANT
PACK ANTERIOR HIP CUSTOM (KITS) ×1 IMPLANT
PINNACLE PLUS 4 NEUTRAL (Hips) ×1 IMPLANT
SET HNDPC FAN SPRY TIP SCT (DISPOSABLE) ×1 IMPLANT
STAPLER VISISTAT 35W (STAPLE) IMPLANT
STRIP CLOSURE SKIN 1/2X4 (GAUZE/BANDAGES/DRESSINGS) IMPLANT
SUT ETHIBOND NAB CT1 #1 30IN (SUTURE) ×1 IMPLANT
SUT ETHILON 2 0 PS N (SUTURE) IMPLANT
SUT MNCRL AB 4-0 PS2 18 (SUTURE) IMPLANT
SUT VIC AB 0 CT1 36 (SUTURE) ×1 IMPLANT
SUT VIC AB 1 CT1 36 (SUTURE) ×1 IMPLANT
SUT VIC AB 2-0 CT1 27 (SUTURE) ×2
SUT VIC AB 2-0 CT1 TAPERPNT 27 (SUTURE) ×2 IMPLANT
TRAY FOLEY MTR SLVR 16FR STAT (SET/KITS/TRAYS/PACK) IMPLANT
YANKAUER SUCT BULB TIP NO VENT (SUCTIONS) ×1 IMPLANT

## 2022-09-19 NOTE — Op Note (Signed)
Operative Note  Date of operation: 09/19/2022 Preoperative diagnosis: Right hip primary osteoarthritis Postoperative diagnosis: Same  Procedure: Right direct anterior total hip arthroplasty  Implants: Implant Name Type Inv. Item Serial No. Manufacturer Lot No. LRB No. Used Action  CUP SECTOR GRIPTON - [id number] Cup CUP SECTOR GRIPTON  [employer]PU[employer] [id number] Right 1 Implanted  PINNACLE PLUS 4 NEUTRAL - [id number] Hips PINNACLE PLUS 4 NEUTRAL  DEPUY ORTHOPAEDICS [id number] Right 1 Implanted  FEM STEM 12/14 TAPER SZ 4 HIP - [id number] Orthopedic Implant FEM STEM 12/14 TAPER SZ 4 HIP  DEPUY ORTH[employer] [id number] Right 1 Implanted  HIP BALL CERAMIC - [id number] Hips HIP BALL CERAMIC  DEPU[employer] ORTH[employer] [id number] Right 1 Implanted   Surgeon: Vanita Panda. Magnus Ivan, MD Assistant: Rexene Edison, PA-C  Anesthesia: Spinal Antibiotics: IV Ancef EBL 350 to 400 cc Complications: None  Indications: The patient is a 62 year old female with debilitating arthritis involving her right hip this been well-documented with clinical exam and x-ray findings.  She does have daily right hip pain and it is detrimentally affecting her mobility, her quality of life and activities a living.  This has been getting worse over the last year and she does wish to proceed with a hip replacement at this point.  She has tried failed all forms of conservative treatment.  We did discuss in length in detail the risk of acute blood loss anemia, nerve or vessel injury, fracture, infection, DVT, dislocation, implant failure, leg length differences and wound healing issues.  She understands her goals are hopefully decrease pain, improve mobility, and improve quality of life.  Procedure description: After informed consent was obtained and the appropriate right hip was marked, the patient was brought to the operating room and set up on the stretcher where spinal anesthesia was obtained.  She was then laid in the spine position on  stretcher and a Foley catheter was placed.  Traction boots were placed on both her feet and she was placed supine on the Hana fracture table with a perineal post in place in both legs and inline skeletal traction devices but no traction applied.  Her right operative hip and pelvis were assessed radiographically.  The right hip was then prepped and draped with DuraPrep and sterile drapes.  A timeout was called and she identified as correct patient correct right hip.  An incision was then made just inferior and posterior to the ASIS and carried slightly obliquely down the leg.  Dissection was carried down to the tensor fascia lata muscle and the tensor fascia was then divided longitudinally to proceed with a direct interposed the hip.  Circumflex vessels were identified and cauterized.  The hip capsule was identified and opened up in L-type format finding a moderate joint effusion.  Cobra retractors were placed around the medial and lateral femoral neck and a femoral neck cut was made with an oscillating saw just proximal to the lesser trochanter and this Was completed with an osteotome.  A corkscrew guide was placed in the femoral head and the femoral head was removed its entirety.  There was a wide area devoid of cartilage.  A bent Hohmann was then placed over the medial acetabular rim and remnants of the acetabular labrum and other debris removed.  Reaming was then initiated using the reaming system from [employer] from a size 43 reamer and stepwise increments, with a size 49 reamer with all reamers placed under direct visualization and the last reamer was placed under direct fluoroscopy in order to obtain  the depth and reaming, the inclination and the anteversion.  The real De[employer]uy sector GRIPTION acetabular opponent size 50 was then placed without difficulty followed by a 32+4 polyethylene liner.  Attention was then turned to the femur.  With the right leg externally rotated to 120 degrees, extended and adducted, a  Mueller retractor was placed medially and a Hohmann retractor was placed behind the greater trochanter.  The lateral joint capsule was released and a box cutting osteotome was used to enter the femoral canal.  Broaching was then initiated using the Actis broaching system from a size 0 going up to a size 4.  With a size 4 in place we trialed a standard offset femoral neck and a 32+1 trial hip ball.  We brought the leg back up and with traction and internal rotation reduced in the pelvis.  Based on radiographic and clinical assessment we needed a low bit more leg length and offset.  We dislocated the hip and removed the trial components.  The real Actis femoral component size 4 with high offset was placed followed by the real 32+5 ceramic head ball.  Again this was reduced in pelvis and we are pleased with range of motion as well as stability assessed clinically and radiographically.  We were pleased with leg length and offset.  The soft tissue was then irrigated normal saline solution using pulse lavage.  The joint capsule was closed with interrupted #1 Ethibond suture followed by #1 Vicryl to close the tensor fascia.  0 Vicryl was used to close the deep tissue and 2-0 Vicryl was used to close subcutaneous tissue.  The skin was closed with staples.  An Aquacel dressing was applied.  She was taken off the Hana table and taken to the recovery room.  Rexene Edison, PA-C did assist during the entire case and beginning to end and his assistance was crucial and medically necessary for soft tissue management and retraction, helping guide implant placement and a layered closure of the wound.

## 2022-09-19 NOTE — Anesthesia Preprocedure Evaluation (Signed)
Anesthesia Evaluation  Patient identified by MRN, date of birth, ID band Patient awake    Reviewed: Allergy & Precautions, NPO status , Patient's Chart, lab work & pertinent test results  History of Anesthesia Complications Negative for: history of anesthetic complications  Airway Mallampati: III  TM Distance: <3 FB Neck ROM: Limited    Dental  (+) Teeth Intact, Dental Advisory Given   Pulmonary neg shortness of breath, neg sleep apnea, neg COPD, neg recent URI   breath sounds clear to auscultation       Cardiovascular hypertension, Pt. on medications (-) angina (-) Past MI and (-) CHF  Rhythm:Regular     Neuro/Psych  Headaches PSYCHIATRIC DISORDERS Anxiety Depression     Neuromuscular disease    GI/Hepatic negative GI ROS, Neg liver ROS,,,  Endo/Other  negative endocrine ROS    Renal/GU negative Renal ROS     Musculoskeletal  (+) Arthritis ,    Abdominal   Peds  Hematology negative hematology ROS (+) Lab Results      Component                Value               Date                      WBC                      8.7                 09/11/2022                HGB                      13.7                09/11/2022                HCT                      42.4                09/11/2022                MCV                      85.1                09/11/2022                PLT                      304                 09/11/2022            Denies blood thinners   Anesthesia Other Findings   Reproductive/Obstetrics                              Anesthesia Physical Anesthesia Plan  ASA: 2  Anesthesia Plan: MAC and Spinal   Post-op Pain Management:    Induction: Intravenous  PONV Risk Score and Plan: 2 and Ondansetron, Propofol infusion and TIVA  Airway Management Planned: Nasal Cannula, Natural Airway and Simple Face Mask  Additional Equipment: None  Intra-op Plan:   Post-operative  Plan:  Informed Consent: I have reviewed the patients History and Physical, chart, labs and discussed the procedure including the risks, benefits and alternatives for the proposed anesthesia with the patient or authorized representative who has indicated his/her understanding and acceptance.     Dental advisory given  Plan Discussed with: CRNA  Anesthesia Plan Comments:          Anesthesia Quick Evaluation

## 2022-09-19 NOTE — Plan of Care (Signed)

## 2022-09-19 NOTE — Plan of Care (Signed)
  Problem: Education: Goal: Knowledge of the prescribed therapeutic regimen will improve Outcome: Adequate for Discharge Goal: Understanding of discharge needs will improve Outcome: Progressing Goal: Individualized Educational Video(s) Outcome: Completed/Met   Problem: Activity: Goal: Ability to avoid complications of mobility impairment will improve Outcome: Progressing Goal: Ability to tolerate increased activity will improve Outcome: Adequate for Discharge   Problem: Clinical Measurements: Goal: Postoperative complications will be avoided or minimized Outcome: Progressing   Problem: Pain Management: Goal: Pain level will decrease with appropriate interventions Outcome: Progressing   Problem: Skin Integrity: Goal: Will show signs of wound healing Outcome: Progressing   Problem: Education: Goal: Knowledge of the prescribed therapeutic regimen will improve Outcome: Progressing Goal: Understanding of discharge needs will improve Outcome: Progressing Goal: Individualized Educational Video(s) Outcome: Completed/Met   Problem: Activity: Goal: Ability to avoid complications of mobility impairment will improve Outcome: Progressing   Problem: Education: Goal: Knowledge of General Education information will improve Description: Including pain rating scale, medication(s)/side effects and non-pharmacologic comfort measures Outcome: Progressing   Problem: Health Behavior/Discharge Planning: Goal: Ability to manage health-related needs will improve Outcome: Adequate for Discharge   Problem: Clinical Measurements: Goal: Ability to maintain clinical measurements within normal limits will improve Outcome: Progressing Goal: Will remain free from infection Outcome: Progressing Goal: Diagnostic test results will improve Outcome: Progressing Goal: Respiratory complications will improve Outcome: Progressing Goal: Cardiovascular complication will be avoided Outcome: Progressing    Problem: Activity: Goal: Risk for activity intolerance will decrease Outcome: Adequate for Discharge   Problem: Nutrition: Goal: Adequate nutrition will be maintained Outcome: Completed/Met   Problem: Coping: Goal: Level of anxiety will decrease Outcome: Progressing   Problem: Pain Managment: Goal: General experience of comfort will improve Outcome: Progressing   Problem: Safety: Goal: Ability to remain free from injury will improve Outcome: Progressing   Problem: Skin Integrity: Goal: Risk for impaired skin integrity will decrease Outcome: Progressing   Problem: Clinical Measurements: Goal: Ability to maintain clinical measurements within normal limits will improve Outcome: Progressing Goal: Will remain free from infection Outcome: Progressing

## 2022-09-19 NOTE — Interval H&P Note (Signed)
History and Physical Interval Note: The patient understands that she is here today for a right total hip replacement to treat her severe right hip arthritis.  There has been no acute or interval change in her medical status.  The risks and benefits of surgery been discussed in detail and informed consent has been obtained.  The right operative hip has been marked.  09/19/2022 10:01 AM  Jessica [name]  has presented today for surgery, with the diagnosis of right hip osteoarthritis.  The various methods of treatment have been discussed with the patient and family. After consideration of risks, benefits and other options for treatment, the patient has consented to  Procedure(s): RIGHT TOTAL HIP ARTHROPLASTY ANTERIOR APPROACH (Right) as a surgical intervention.  The patient's history has been reviewed, patient examined, no change in status, stable for surgery.  I have reviewed the patient's chart and labs.  Questions were answered to the patient's satisfaction.     Kathryne Hitch

## 2022-09-19 NOTE — Anesthesia Procedure Notes (Signed)
Spinal  Patient location during procedure: OR Start time: 09/19/2022 11:33 AM End time: 09/19/2022 11:39 AM Reason for block: surgical anesthesia Staffing Performed: anesthesiologist  Anesthesiologist: Val Eagle, MD Performed by: Val Eagle, MD Authorized by: Val Eagle, MD   Preanesthetic Checklist Completed: patient identified, IV checked, risks and benefits discussed, surgical consent, monitors and equipment checked, pre-op evaluation and timeout performed Spinal Block Patient position: sitting Prep: DuraPrep Patient monitoring: heart rate, cardiac monitor, continuous pulse ox and blood pressure Approach: midline Location: L4-5 Injection technique: single-shot Needle Needle type: Pencan  Needle gauge: 24 G Needle length: 9 cm Assessment Sensory level: T6 Events: CSF return

## 2022-09-19 NOTE — Anesthesia Postprocedure Evaluation (Signed)
Anesthesia Post Note  Patient: Jessica [name]  Procedure(s) Performed: RIGHT TOTAL HIP ARTHROPLASTY ANTERIOR APPROACH (Right: Hip)     Patient location during evaluation: PACU Anesthesia Type: MAC and Spinal Level of consciousness: awake and alert Pain management: pain level controlled Vital Signs Assessment: post-procedure vital signs reviewed and stable Respiratory status: spontaneous breathing, nonlabored ventilation and respiratory function stable Cardiovascular status: blood pressure returned to baseline and stable Postop Assessment: no apparent nausea or vomiting Anesthetic complications: no   No notable events documented.  Last Vitals:  Vitals:   09/19/22 1415 09/19/22 1430  BP: 114/68 106/60  Pulse: (!) 47 (!) 50  Resp: 11 11  Temp:    SpO2: 97% 98%    Last Pain:  Vitals:   09/19/22 1430  TempSrc:   PainSc: 0-No pain                 Sian Rockers

## 2022-09-19 NOTE — Evaluation (Signed)
Physical Therapy Evaluation Patient Details Name: Jessica [name] MRN: [id number] DOB: 29-Mar-1960 Today's Date: 09/19/2022  History of Present Illness  62 yo female presents to therapy s/p R THA, anterior approach on 09/19/2022 due to failure of conservative measures. Pt PMH includes but is not limited to: angina, cholelithiasis, impaired vision,cervical pain, SIRS, hypotension, depression, anxiety, port a cath in situ, peripheral neuropathy, anemia, malignant neoplasm of peritoneum/endometrial, HTN, and HLD.  Clinical Impression      Jessica [name] is a 62 y.o. female POD 0 s/p R THA. Patient reports mod I with mobility at baseline. Patient is now limited by functional impairments (see PT problem list below) and requires CGA for bed mobility and CGA and cues for transfers. Patient was able to ambulate 50 feet with RW and CGA level of assist. Patient instructed in exercise to facilitate ROM and circulation to manage edema.  Patient will benefit from continued skilled PT interventions to address impairments and progress towards PLOF. Acute PT will follow to progress mobility and stair training in preparation for safe discharge home with family support and [facility] services.      If plan is discharge home, recommend the following: A little help with walking and/or transfers;A little help with bathing/dressing/bathroom;Assistance with cooking/housework;Assist for transportation;Help with stairs or ramp for entrance   Can travel by private vehicle        Equipment Recommendations Rolling walker (2 wheels)  Recommendations for Other Services       Functional Status Assessment Patient has had a recent decline in their functional status and demonstrates the ability to make significant improvements in function in a reasonable and predictable amount of time.     Precautions / Restrictions Precautions Precautions: Fall Restrictions Weight Bearing Restrictions: No      Mobility  Bed  Mobility Overal bed mobility: Needs Assistance Bed Mobility: Supine to Sit     Supine to sit: Contact guard, HOB elevated, Used rails     General bed mobility comments: min cues    Transfers Overall transfer level: Needs assistance Equipment used: Rolling walker (2 wheels) Transfers: Sit to/from Stand Sit to Stand: Contact guard assist, From elevated surface           General transfer comment: min cues for proper technique and UE placement    Ambulation/Gait Ambulation/Gait assistance: Contact guard assist Gait Distance (Feet): 50 Feet Assistive device: Rolling walker (2 wheels) Gait Pattern/deviations: Step-to pattern, Antalgic Gait velocity: decreased        Stairs            Wheelchair Mobility     Tilt Bed    Modified Rankin (Stroke Patients Only)       Balance Overall balance assessment: Needs assistance Sitting-balance support: Feet supported Sitting balance-Leahy Scale: Good     Standing balance support: Bilateral upper extremity supported, During functional activity, Reliant on assistive device for balance Standing balance-Leahy Scale: Poor                               Pertinent Vitals/Pain Pain Assessment Pain Assessment: 0-10 Pain Score: 2  Pain Location: R hip and LE Pain Descriptors / Indicators: Aching, Constant, Discomfort, Operative site guarding Pain Intervention(s): Limited activity within patient's tolerance, Monitored during session, Premedicated before session, Repositioned, Ice applied    Home Living Family/patient expects to be discharged to:: Private residence Living Arrangements: Children;Other (Comment) Available Help at Discharge: Family Type of Home: House Home  Access: Ramped entrance       Home Layout: One level Home Equipment: Rollator (4 wheels);Cane - single point;Toilet riser;Wheelchair - manual      Prior Function Prior Level of Function : Independent/Modified Independent              Mobility Comments: mod I with SPC for all ADLs, self care tasks and IADLs ADLs Comments: occational assist to don socks and shoes     Extremity/Trunk Assessment        Lower Extremity Assessment Lower Extremity Assessment: RLE deficits/detail RLE Deficits / Details: ankle DF/PF 5/5 RLE Sensation: decreased light touch;history of peripheral neuropathy    Cervical / Trunk Assessment Cervical / Trunk Assessment: Normal  Communication   Communication Communication: No apparent difficulties  Cognition Arousal: Alert Behavior During Therapy: WFL for tasks assessed/performed Overall Cognitive Status: Within Functional Limits for tasks assessed                                          General Comments      Exercises Total Joint Exercises Ankle Circles/Pumps: AROM, Both, 15 reps   Assessment/Plan    PT Assessment Patient needs continued PT services  PT Problem List Decreased strength;Decreased range of motion;Decreased activity tolerance;Decreased balance;Decreased mobility;Decreased coordination;Pain       PT Treatment Interventions DME instruction;Gait training;Functional mobility training;Therapeutic activities;Therapeutic exercise;Balance training;Neuromuscular re-education;Patient/family education;Modalities    PT Goals (Current goals can be found in the Care Plan section)  Acute Rehab PT Goals Patient Stated Goal: to be able to dance at my 62th birthday party PT Goal Formulation: With patient Time For Goal Achievement: 10/03/22 Potential to Achieve Goals: Good    Frequency 7X/week     Co-evaluation               AM-PAC PT "6 Clicks" Mobility  Outcome Measure Help needed turning from your back to your side while in a flat bed without using bedrails?: A Little Help needed moving from lying on your back to sitting on the side of a flat bed without using bedrails?: A Little Help needed moving to and from a bed to a chair (including a  wheelchair)?: A Little Help needed standing up from a chair using your arms (e.g., wheelchair or bedside chair)?: A Little Help needed to walk in hospital room?: A Little Help needed climbing 3-5 steps with a railing? : A Lot 6 Click Score: 17    End of Session Equipment Utilized During Treatment: Gait belt Activity Tolerance: Patient tolerated treatment well Patient left: in chair;with call bell/phone within reach;with family/visitor present Nurse Communication: Mobility status PT Visit Diagnosis: Unsteadiness on feet (R26.81);Other abnormalities of gait and mobility (R26.89);Muscle weakness (generalized) (M62.81);Pain;Difficulty in walking, not elsewhere classified (R26.2) Pain - Right/Left: Right Pain - part of body: Leg;Hip    Time: 8295-6213 PT Time Calculation (min) (ACUTE ONLY): 28 min   Charges:   PT Evaluation $PT Eval Low Complexity: 1 Low PT Treatments $Gait Training: 8-22 mins PT General Charges $$ ACUTE PT VISIT: 1 Visit         Johnny Bridge, PT Acute Rehab   Jacqualyn Posey 09/19/2022, 6:33 PM

## 2022-09-19 NOTE — Transfer of Care (Signed)
Immediate Anesthesia Transfer of Care Note  Patient: Jessica [name]  Procedure(s) Performed: RIGHT TOTAL HIP ARTHROPLASTY ANTERIOR APPROACH (Right: Hip)  Patient Location: PACU  Anesthesia Type:MAC and Spinal  Level of Consciousness: awake, alert , oriented, and patient cooperative  Airway & Oxygen Therapy: Patient Spontanous Breathing and Patient connected to face mask oxygen  Post-op Assessment: Report given to RN and Post -op Vital signs reviewed and stable  Post vital signs: Reviewed and stable  Last Vitals:  Vitals Value Taken Time  BP    Temp    Pulse    Resp    SpO2      Last Pain:  Vitals:   09/19/22 0904  TempSrc:   PainSc: 2          Complications: No notable events documented.

## 2022-09-20 DIAGNOSIS — M1611 Unilateral primary osteoarthritis, right hip: Secondary | ICD-10-CM | POA: Diagnosis not present

## 2022-09-20 LAB — BASIC METABOLIC PANEL
Anion gap: 8 (ref 5–15)
BUN: 14 mg/dL (ref 6–20)
CO2: 24 mmol/L (ref 22–32)
Calcium: 8.5 mg/dL — ABNORMAL LOW (ref 8.9–10.3)
Chloride: 106 mmol/L (ref 98–111)
Creatinine, Ser: 0.8 mg/dL (ref 0.44–1.00)
GFR, Estimated: >60 mL/min (ref >60)
Glucose, Bld: 146 mg/dL — ABNORMAL HIGH (ref 70–99)
Potassium: 3.8 mmol/L (ref 3.5–5.1)
Sodium: 138 mmol/L (ref 135–145)

## 2022-09-20 LAB — CBC
HCT: 33.2 % — ABNORMAL LOW (ref 36.0–46.0)
Hemoglobin: 10.7 g/dL — ABNORMAL LOW (ref 12.0–15.0)
MCH: 27.8 pg (ref 26.0–34.0)
MCHC: 32.2 g/dL (ref 30.0–36.0)
MCV: 86.2 fL (ref 80.0–100.0)
Platelets: 242 10*3/uL (ref 150–400)
RBC: 3.85 MIL/uL — ABNORMAL LOW (ref 3.87–5.11)
RDW: 15.6 % — ABNORMAL HIGH (ref 11.5–15.5)
WBC: 13.5 10*3/uL — ABNORMAL HIGH (ref 4.0–10.5)
nRBC: 0 % (ref 0.0–0.2)

## 2022-09-20 MED ORDER — METHOCARBAMOL 500 MG PO TABS: 500.0000 mg | ORAL_TABLET | Freq: Four times a day (QID) | ORAL | 1 refills | Status: AC | PRN

## 2022-09-20 MED ORDER — OXYCODONE HCL 5 MG PO TABS: 5.0000 mg | ORAL_TABLET | Freq: Four times a day (QID) | ORAL | 0 refills | Status: DC | PRN

## 2022-09-20 NOTE — Plan of Care (Signed)
  Problem: Pain Management: Goal: Pain level will decrease with appropriate interventions Outcome: Progressing   Problem: Coping: Goal: Level of anxiety will decrease Outcome: Progressing   

## 2022-09-20 NOTE — TOC Initial Note (Signed)
Transition of Care [facility]) - Initial/Assessment Note    Patient Details  Name: Jessica [name] MRN: [id number] Date of Birth: 09-Oct-1960  Transition of Care [facility] CM/SW Contact:    Carmina Miller, LCSWA Phone Number: 09/20/2022, 4:12 PM  Clinical Narrative:                  PT rec RW for pt, spoke with Selena Batten at [employer], will be delivered to the room tomorrow.        Patient Goals and CMS Choice            Expected Discharge Plan and Services                                              Prior Living Arrangements/Services                       Activities of Daily Living Home Assistive Devices/Equipment: Cane (specify quad or straight), Eyeglasses, Walker (specify type) ADL Screening (condition at time of admission) Patient's cognitive ability adequate to safely complete daily activities?: No Is the patient deaf or have difficulty hearing?: No Does the patient have difficulty seeing, even when wearing glasses/contacts?: No Does the patient have difficulty concentrating, remembering, or making decisions?: No Patient able to express need for assistance with ADLs?: Yes Does the patient have difficulty dressing or bathing?: No Independently performs ADLs?: Yes (appropriate for developmental age) Does the patient have difficulty walking or climbing stairs?: Yes Weakness of Legs: Right Weakness of Arms/Hands: None  Permission Sought/Granted                  Emotional Assessment              Admission diagnosis:  Status post total replacement of right hip [Z96.641] Patient Active Problem List   Diagnosis Date Noted   Status post total replacement of right hip 09/19/2022   Unilateral primary osteoarthritis, right hip 09/18/2022   Acute right hip pain 07/09/2022   Cardiac chest pain 04/10/2022   Cholelithiasis 12/20/2021   Vitamin D deficiency 06/18/2021   Prediabetes 06/18/2021   Annual physical exam 06/18/2021   Impaired vision in both  eyes 06/18/2021   Breast tenderness in female 06/18/2021   Neck pain, acute 02/11/2021   Headache 02/11/2021   Morbid obesity (HCC) 01/28/2021   Acute bronchitis 01/28/2021   Insomnia 01/28/2021   SIRS (systemic inflammatory response syndrome) (HCC) 12/07/2020   Hypotension    Peripheral neuropathy due to chemotherapy (HCC) 06/27/2020   Anemia due to antineoplastic chemotherapy 06/27/2020   Depression, major, single episode, moderate (HCC) 09/07/2019   Anxiety 09/07/2019   Port-A-Cath in place 12/24/2018   Goals of care, counseling/discussion 12/23/2018   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HCC) 12/14/2018   Elevated LFTs 05/05/2018   Hyperlipidemia 04/27/2018   Essential hypertension 04/26/2018   Endometrial cancer (HCC) 03/31/2018   H/O total hysterectomy 03/2018   PCP:  Billie Lade, MD Pharmacy:   [facility] [address] [place] - [address] [place] [address] Phone: [phone] Fax: [phone]  [facility] [place], [place] - [address] [place] [address] Phone: [phone] Fax: [phone]     Social Determinants of Health (SDOH) Social History: SDOH Screenings  Food Insecurity: No Food Insecurity (09/19/2022)  Housing: Patient Declined (09/19/2022)  Transportation Needs: No Transportation Needs (09/19/2022)  Utilities: Not At Risk (09/19/2022)  Alcohol Screen: Low Risk  (07/05/2022)  Depression (PHQ2-9): High Risk (07/09/2022)  Financial Resource Strain: Low Risk  (07/05/2022)  Physical Activity: Insufficiently Active (07/05/2022)  Social Connections: Socially Isolated (07/05/2022)  Stress: No Stress Concern Present (07/05/2022)  Tobacco Use: Low Risk  (09/19/2022)   SDOH Interventions:     Readmission Risk Interventions     No data to display

## 2022-09-20 NOTE — Progress Notes (Signed)
Subjective: 1 Day Post-Op Procedure(s) (LRB): RIGHT TOTAL HIP ARTHROPLASTY ANTERIOR APPROACH (Right) Patient reports pain as moderate.    Objective: Vital signs in last 24 hours: Temp:  [97.6 F (36.4 C)-98.9 F (37.2 C)] 98.9 F (37.2 C) (08/31 0953) Pulse Rate:  [47-91] 63 (08/31 0953) Resp:  [11-19] 18 (08/31 0953) BP: (99-135)/(49-98) 126/61 (08/31 0953) SpO2:  [93 %-98 %] 98 % (08/31 0953)  Intake/Output from previous day: 08/30 0701 - 08/31 0700 In: 2992.3 [P.O.:860; I.V.:1727.3; IV Piggyback:405] Out: 2800 [Urine:2400; Blood:400] Intake/Output this shift: Total I/O In: 240 [P.O.:240] Out: 0   Recent Labs    09/20/22 0354  HGB 10.7*   Recent Labs    09/20/22 0354  WBC 13.5*  RBC 3.85*  HCT 33.2*  PLT 242   Recent Labs    09/20/22 0354  NA 138  K 3.8  CL 106  CO2 24  BUN 14  CREATININE 0.80  GLUCOSE 146*  CALCIUM 8.5*   No results for input(s): "LABPT", "INR" in the last 72 hours.  Sensation intact distally Intact pulses distally Dorsiflexion/Plantar flexion intact Incision: scant drainage   Assessment/Plan: 1 Day Post-Op Procedure(s) (LRB): RIGHT TOTAL HIP ARTHROPLASTY ANTERIOR APPROACH (Right) Up with therapy Plan for discharge tomorrow Discharge home with home health      Kathryne Hitch 09/20/2022, 10:53 AM

## 2022-09-20 NOTE — Plan of Care (Signed)
  Problem: Education: Goal: Knowledge of the prescribed therapeutic regimen will improve Outcome: Progressing   Problem: Pain Management: Goal: Pain level will decrease with appropriate interventions Outcome: Progressing   Problem: Activity: Goal: Ability to avoid complications of mobility impairment will improve Outcome: Progressing   

## 2022-09-20 NOTE — Progress Notes (Signed)
Physical Therapy Treatment Patient Details Name: Jessica [name] MRN: [id number] DOB: 1960/11/19 Today's Date: 09/20/2022   History of Present Illness 62 yo female presents to therapy s/p R THA, anterior approach on 09/19/2022 due to failure of conservative measures. Pt PMH includes but is not limited to: angina, cholelithiasis, impaired vision,cervical pain, SIRS, hypotension, depression, anxiety, port a cath in situ, peripheral neuropathy, anemia, malignant neoplasm of peritoneum/endometrial, HTN, and HLD.    PT Comments  Pt reports feeling much better, pain more controlled; amb ~ 120' with RW and CGA; anticipate steady progress and will likely be ready for d/c tomorrow   If plan is discharge home, recommend the following: A little help with walking and/or transfers;A little help with bathing/dressing/bathroom;Assistance with cooking/housework;Assist for transportation;Help with stairs or ramp for entrance   Can travel by private vehicle        Equipment Recommendations  Rolling walker (2 wheels)    Recommendations for Other Services       Precautions / Restrictions Precautions Precautions: Fall Restrictions Weight Bearing Restrictions: No Other Position/Activity Restrictions: WBAT     Mobility  Bed Mobility Overal bed mobility: Needs Assistance Bed Mobility: Supine to Sit     Supine to sit: Contact guard, Min assist     General bed mobility comments: assist with RLE , incr time    Transfers Overall transfer level: Needs assistance Equipment used: Rolling walker (2 wheels) Transfers: Sit to/from Stand Sit to Stand: Contact guard assist, Supervision           General transfer comment: STS from bed and toilet; cues for proper technique and UE placement    Ambulation/Gait Ambulation/Gait assistance: Contact guard assist Gait Distance (Feet): 120 Feet Assistive device: Rolling walker (2 wheels) Gait Pattern/deviations: Step-to pattern, Step-through pattern        General Gait Details: progression to step through with improved wt shift to RLE with incr distance   Stairs             Wheelchair Mobility     Tilt Bed    Modified Rankin (Stroke Patients Only)       Balance   Sitting-balance support: Feet supported Sitting balance-Leahy Scale: Good     Standing balance support: Bilateral upper extremity supported, During functional activity, Reliant on assistive device for balance Standing balance-Leahy Scale: Fair                              Cognition Arousal: Alert Behavior During Therapy: WFL for tasks assessed/performed Overall Cognitive Status: Within Functional Limits for tasks assessed                                          Exercises Total Joint Exercises Ankle Circles/Pumps: AROM, Both, 10 reps    General Comments        Pertinent Vitals/Pain Pain Assessment Pain Assessment: 0-10 Pain Score: 4  Pain Location: R hip and LE Pain Descriptors / Indicators: Aching, Constant, Discomfort, Operative site guarding Pain Intervention(s): Limited activity within patient's tolerance, Monitored during session, Premedicated before session, Repositioned, Ice applied    Home Living                          Prior Function            PT Goals (current  goals can now be found in the care plan section) Acute Rehab PT Goals Patient Stated Goal: to be able to dance at my 62th birthday party PT Goal Formulation: With patient Time For Goal Achievement: 10/03/22 Potential to Achieve Goals: Good Progress towards PT goals: Progressing toward goals    Frequency    7X/week      PT Plan      Co-evaluation              AM-PAC PT "6 Clicks" Mobility   Outcome Measure  Help needed turning from your back to your side while in a flat bed without using bedrails?: A Little Help needed moving from lying on your back to sitting on the side of a flat bed without using  bedrails?: A Little Help needed moving to and from a bed to a chair (including a wheelchair)?: A Little Help needed standing up from a chair using your arms (e.g., wheelchair or bedside chair)?: A Little Help needed to walk in hospital room?: A Little Help needed climbing 3-5 steps with a railing? : A Little 6 Click Score: 18    End of Session Equipment Utilized During Treatment: Gait belt Activity Tolerance: Patient tolerated treatment well Patient left: in bed;with call bell/phone within reach;with bed alarm set   PT Visit Diagnosis: Unsteadiness on feet (R26.81);Other abnormalities of gait and mobility (R26.89);Muscle weakness (generalized) (M62.81);Pain;Difficulty in walking, not elsewhere classified (R26.2) Pain - Right/Left: Right Pain - part of body: Leg;Hip     Time: 1345-1405 PT Time Calculation (min) (ACUTE ONLY): 20 min  Charges:    $Gait Training: 8-22 mins PT General Charges $$ ACUTE PT VISIT: 1 Visit                     Coy Vandoren, PT  Acute Rehab Dept [facility]) [phone]  09/20/2022    [facility] 09/20/2022, 3:30 PM

## 2022-09-20 NOTE — Discharge Instructions (Signed)

## 2022-09-20 NOTE — Progress Notes (Signed)
PT TX NOTE  09/20/22 1620  PT Visit Information  Assistance Needed Exercise focused session this pm; discussed when to resume pool exercise (when incision healed and cleared by MD); discussed post op activity progression.  Pt reports recently joining [facility] and plans to incr activity once hip has healed and pain subsided.  Pt will likely be ready for d/c Sunday from PT standpoint  History of Present Illness 62 yo female presents to therapy s/p R THA, anterior approach on 09/19/2022 due to failure of conservative measures. Pt PMH includes but is not limited to: angina, cholelithiasis, impaired vision,cervical pain, SIRS, hypotension, depression, anxiety, port a cath in situ, peripheral neuropathy, anemia, malignant neoplasm of peritoneum/endometrial, HTN, and HLD.  Subjective Data  Patient Stated Goal to be able to dance at my 62th birthday party  Precautions  Precautions Fall  Restrictions  Weight Bearing Restrictions No  Other Position/Activity Restrictions WBAT  Pain Assessment  Pain Location R hip and LE  Pain Descriptors / Indicators Aching;Constant;Discomfort;Operative site guarding  Cognition  Arousal Alert  Behavior During Therapy WFL for tasks assessed/performed  Overall Cognitive Status Within Functional Limits for tasks assessed  Total Joint Exercises  Ankle Circles/Pumps AROM;Both;10 reps  Quad Sets AROM;Both;10 reps  Heel Slides AAROM;Right;10 reps  Hip ABduction/ADduction AAROM;Right;10 reps  Short Arc Quad AROM;Right;10 reps  PT - End of Session  Activity Tolerance Patient tolerated treatment well  Patient left in bed;with call bell/phone within reach;with bed alarm set   PT - Assessment/Plan  PT Visit Diagnosis Unsteadiness on feet (R26.81);Other abnormalities of gait and mobility (R26.89);Muscle weakness (generalized) (M62.81);Pain;Difficulty in walking, not elsewhere classified (R26.2)  Pain - Right/Left Right  Pain - part of body Leg;Hip  PT Frequency (ACUTE ONLY)  7X/week  Follow Up Recommendations Follow physician's recommendations for discharge plan and follow up therapies  Patient can return home with the following A little help with walking and/or transfers;A little help with bathing/dressing/bathroom;Assistance with cooking/housework;Assist for transportation;Help with stairs or ramp for entrance  PT equipment Rolling walker (2 wheels)  AM-PAC PT "6 Clicks" Mobility Outcome Measure (Version 2)  Help needed turning from your back to your side while in a flat bed without using bedrails? 3  Help needed moving from lying on your back to sitting on the side of a flat bed without using bedrails? 3  Help needed moving to and from a bed to a chair (including a wheelchair)? 3  Help needed standing up from a chair using your arms (e.g., wheelchair or bedside chair)? 3  Help needed to walk in hospital room? 3  Help needed climbing 3-5 steps with a railing?  3  6 Click Score 18  Consider Recommendation of Discharge To: Home with [facility]  PT Goal Progression  Progress towards PT goals Progressing toward goals  Acute Rehab PT Goals  PT Goal Formulation With patient  Time For Goal Achievement 10/03/22  Potential to Achieve Goals Good  PT Time Calculation  PT Start Time (ACUTE ONLY) 1554  PT Stop Time (ACUTE ONLY) 1617  PT Time Calculation (min) (ACUTE ONLY) 23 min  PT General Charges  $$ ACUTE PT VISIT 1 Visit  PT Treatments  $Therapeutic Exercise 23-37 mins

## 2022-09-21 DIAGNOSIS — M1611 Unilateral primary osteoarthritis, right hip: Secondary | ICD-10-CM | POA: Diagnosis not present

## 2022-09-21 NOTE — Progress Notes (Signed)
Subjective: 2 Days Post-Op Procedure(s) (LRB): RIGHT TOTAL HIP ARTHROPLASTY ANTERIOR APPROACH (Right) Patient reports pain as moderate.    Objective: Vital signs in last 24 hours: Temp:  [98.3 F (36.8 C)-98.9 F (37.2 C)] 98.3 F (36.8 C) (09/01 0511) Pulse Rate:  [63-72] 67 (09/01 0511) Resp:  [16-18] 18 (09/01 0511) BP: (126-132)/(54-70) 127/61 (09/01 0511) SpO2:  [93 %-100 %] 93 % (09/01 0511)  Intake/Output from previous day: 08/31 0701 - 09/01 0700 In: 675 [P.O.:600; I.V.:75] Out: 750 [Urine:750] Intake/Output this shift: No intake/output data recorded.  Recent Labs    09/20/22 0354  HGB 10.7*   Recent Labs    09/20/22 0354  WBC 13.5*  RBC 3.85*  HCT 33.2*  PLT 242   Recent Labs    09/20/22 0354  NA 138  K 3.8  CL 106  CO2 24  BUN 14  CREATININE 0.80  GLUCOSE 146*  CALCIUM 8.5*   No results for input(s): "LABPT", "INR" in the last 72 hours.  Neurologically intact Neurovascular intact Sensation intact distally Intact pulses distally Dorsiflexion/Plantar flexion intact Incision: dressing C/D/I No cellulitis present Compartment soft   Assessment/Plan: 2 Days Post-Op Procedure(s) (LRB): RIGHT TOTAL HIP ARTHROPLASTY ANTERIOR APPROACH (Right) Advance diet Up with therapy Discharge home with home health once cleared by PT WBAT RLE ABLAaaa- mild and stable      Cristie Hem 09/21/2022, 8:27 AM

## 2022-09-21 NOTE — Discharge Summary (Signed)
Patient ID: MACKENZEE GOULD MRN: [id number] DOB/AGE: Apr 05, 1960 62 y.o.  Admit date: 09/19/2022 Discharge date: 09/21/2022  Admission Diagnoses:  Principal Problem:   Unilateral primary osteoarthritis, right hip Active Problems:   Status post total replacement of right hip   Discharge Diagnoses:  Same  Past Medical History:  Diagnosis Date   Allergy    Anemia    Anxiety    Arthritis    Cancer (HCC)    [employer] 09/05/2019   Cancer of skin of back    Chest pain    Depression    Elevated blood pressure reading    Endometrial cancer (HCC)    Hyperlipidemia    Hypertension    PMB (postmenopausal bleeding)    Port-A-Cath in place 12/24/2018   Pre-diabetes     Surgeries: Procedure(s): RIGHT TOTAL HIP ARTHROPLASTY ANTERIOR APPROACH on 09/19/2022   Consultants:   Discharged Condition: Improved  Hospital Course: Jessica [name] is an 62 y.o. female who was admitted 09/19/2022 for operative treatment ofUnilateral primary osteoarthritis, right hip. Patient has severe unremitting pain that affects sleep, daily activities, and work/hobbies. After pre-op clearance the patient was taken to the operating room on 09/19/2022 and underwent  Procedure(s): RIGHT TOTAL HIP ARTHROPLASTY ANTERIOR APPROACH.    Patient was given perioperative antibiotics:  Anti-infectives (From admission, onward)    Start     Dose/Rate Route Frequency Ordered Stop   09/19/22 1815  ceFAZolin (ANCEF) IVPB 1 g/50 mL premix        1 g 100 mL/hr over 30 Minutes Intravenous Every 6 hours 09/19/22 1729 09/20/22 0147   09/19/22 0845  ceFAZolin (ANCEF) IVPB 2g/100 mL premix        2 g 200 mL/hr over 30 Minutes Intravenous On call to O.R. 09/19/22 0840 09/19/22 1210        Patient was given sequential compression devices, early ambulation, and chemoprophylaxis to prevent DVT.  Patient benefited maximally from hospital stay and there were no complications.    Recent vital signs: Patient Vitals for the past 24  hrs:  BP Temp Temp src Pulse Resp SpO2  09/21/22 0511 127/61 98.3 F (36.8 C) Oral 67 18 93 %  09/21/22 0058 132/70 98.3 F (36.8 C) Oral 72 17 95 %  09/20/22 2039 (!) 127/54 98.5 F (36.9 C) Oral 63 17 97 %  09/20/22 1346 (!) 128/58 98.3 F (36.8 C) Oral 69 16 100 %  09/20/22 0953 126/61 98.9 F (37.2 C) Oral 63 18 98 %     Recent laboratory studies:  Recent Labs    09/20/22 0354  WBC 13.5*  HGB 10.7*  HCT 33.2*  PLT 242  NA 138  K 3.8  CL 106  CO2 24  BUN 14  CREATININE 0.80  GLUCOSE 146*  CALCIUM 8.5*     Discharge Medications:   Allergies as of 09/21/2022       Reactions   Carboplatin Other (See Comments)   "body on fire and abdominal pain"   Nickel Rash   itchy        Medication List     STOP taking these medications    oxyCODONE-acetaminophen 5-325 MG tablet Commonly known as: Percocet       TAKE these medications    acetaminophen 500 MG tablet Commonly known as: TYLENOL Take 1,000 mg by mouth every 6 (six) hours as needed (pain.).   amLODipine 5 MG tablet Commonly known as: NORVASC Take 1 tablet by mouth once daily   atorvastatin 20  MG tablet Commonly known as: LIPITOR Take 1 tablet by mouth once daily   EPINEPHrine 0.3 mg/0.3 mL Soaj injection Commonly known as: EPI-PEN Inject 0.3 mg into the muscle as needed for anaphylaxis.   ibuprofen 200 MG tablet Commonly known as: ADVIL Take 400 mg by mouth every 8 (eight) hours as needed (pain.).   lidocaine-prilocaine cream Commonly known as: EMLA Apply to port site prior to use What changed:  how much to take how to take this when to take this reasons to take this additional instructions   MAGnesium-Oxide 400 (240 Mg) MG tablet Generic drug: magnesium oxide Take 1 tablet by mouth twice daily   meclizine 25 MG tablet Commonly known as: ANTIVERT Take 1 tablet (25 mg total) by mouth 3 (three) times daily as needed for dizziness.   methocarbamol 500 MG tablet Commonly known  as: ROBAXIN Take 1 tablet (500 mg total) by mouth every 6 (six) hours as needed for muscle spasms.   nitroGLYCERIN 0.4 MG SL tablet Commonly known as: NITROSTAT Place 1 tablet (0.4 mg total) under the tongue every 5 (five) minutes as needed for chest pain.   oxyCODONE 5 MG immediate release tablet Commonly known as: Oxy IR/ROXICODONE Take 1-2 tablets (5-10 mg total) by mouth every 6 (six) hours as needed for moderate pain (pain score 4-6). No more than 6 tablets per day   PACLITAXEL IV Inject into the vein every 21 ( twenty-one) days.   pregabalin 75 MG capsule Commonly known as: LYRICA TAKE 1 CAPSULE BY MOUTH THREE TIMES DAILY   prochlorperazine 10 MG tablet Commonly known as: COMPAZINE Take 1 tablet (10 mg total) by mouth every 6 (six) hours as needed (Nausea or vomiting).   sertraline 100 MG tablet Commonly known as: ZOLOFT Take 1 tablet by mouth once daily   Vitamin D3 25 MCG (1000 UT) Caps Take 1 capsule (1,000 Units total) by mouth daily.               Durable Medical Equipment  (From admission, onward)           Start     Ordered   09/19/22 1730  DME 3 n 1  Once        09/19/22 1729   09/19/22 1730  DME Walker rolling  Once       Question Answer Comment  Walker: With 5 Inch Wheels   Patient needs a walker to treat with the following condition Status post total replacement of right hip      09/19/22 1729            Diagnostic Studies: DG Pelvis Portable  Result Date: 09/19/2022 CLINICAL DATA:  Status post right hip arthroplasty. EXAM: PORTABLE PELVIS 1-2 VIEWS COMPARISON:  Pelvis and right hip radiographs 07/09/2022 FINDINGS: Interval total right hip arthroplasty. No perihardware lucency is seen to indicate hardware failure or loosening. Mild left femoroacetabular joint space narrowing and peripheral osteophytosis. Mild-to-moderate superior pubic symphysis joint space narrowing with subchondral sclerosis and mild peripheral osteophytosis. Mild  bilateral sacroiliac subchondral sclerosis. No acute fracture or dislocation. Expected postoperative subcutaneous air about the right hip. IMPRESSION: Interval total right hip arthroplasty without evidence of hardware failure or loosening. Electronically Signed   By: Neita Garnet M.D.   On: 09/19/2022 14:39   DG HIP UNILAT WITH PELVIS 1V RIGHT  Result Date: 09/19/2022 CLINICAL DATA:  Total right hip arthroplasty. Intraoperative fluoroscopy. EXAM: DG HIP (WITH OR WITHOUT PELVIS) 1V RIGHT COMPARISON:  Pelvis and right hip  radiographs 07/09/2022 FINDINGS: Images were performed intraoperatively without the presence of a radiologist. Severe superior right femoroacetabular joint space narrowing and peripheral osteophytosis. The patient is undergoing total right hip arthroplasty. No hardware complication is seen. Total fluoroscopy images: 8 Total fluoroscopy time: 18 seconds Total dose: Radiation Exposure Index (as provided by the fluoroscopic device): 2.74 mGy air Kerma Please see intraoperative findings for further detail. IMPRESSION: Intraoperative fluoroscopy for total right hip arthroplasty. Electronically Signed   By: Neita Garnet M.D.   On: 09/19/2022 14:38   DG C-Arm 1-60 Min-No Report  Result Date: 09/19/2022 Fluoroscopy was utilized by the requesting physician.  No radiographic interpretation.   DG C-Arm 1-60 Min-No Report  Result Date: 09/19/2022 Fluoroscopy was utilized by the requesting physician.  No radiographic interpretation.    Disposition: Discharge disposition: 01-Home or Self Care            Signed: Cristie Hem 09/21/2022, 8:27 AM

## 2022-09-21 NOTE — Progress Notes (Signed)
Patient waiting for Rolling Walker to be delivered prior to discharge

## 2022-09-21 NOTE — Progress Notes (Signed)
Nsg Discharge Note  Admit Date:  09/19/2022 Discharge date: 09/21/2022   Ger Teig Fennema to be D/C'd Home per MD order.  AVS completed.  C Patient/caregiver able to verbalize understanding.  Discharge Medication: Allergies as of 09/21/2022       Reactions   Carboplatin Other (See Comments)   "body on fire and abdominal pain"   Nickel Rash   itchy        Medication List     STOP taking these medications    oxyCODONE-acetaminophen 5-325 MG tablet Commonly known as: Percocet       TAKE these medications    acetaminophen 500 MG tablet Commonly known as: TYLENOL Take 1,000 mg by mouth every 6 (six) hours as needed (pain.).   amLODipine 5 MG tablet Commonly known as: NORVASC Take 1 tablet by mouth once daily   atorvastatin 20 MG tablet Commonly known as: LIPITOR Take 1 tablet by mouth once daily   EPINEPHrine 0.3 mg/0.3 mL Soaj injection Commonly known as: EPI-PEN Inject 0.3 mg into the muscle as needed for anaphylaxis.   ibuprofen 200 MG tablet Commonly known as: ADVIL Take 400 mg by mouth every 8 (eight) hours as needed (pain.).   lidocaine-prilocaine cream Commonly known as: EMLA Apply to port site prior to use What changed:  how much to take how to take this when to take this reasons to take this additional instructions   MAGnesium-Oxide 400 (240 Mg) MG tablet Generic drug: magnesium oxide Take 1 tablet by mouth twice daily   meclizine 25 MG tablet Commonly known as: ANTIVERT Take 1 tablet (25 mg total) by mouth 3 (three) times daily as needed for dizziness.   methocarbamol 500 MG tablet Commonly known as: ROBAXIN Take 1 tablet (500 mg total) by mouth every 6 (six) hours as needed for muscle spasms.   nitroGLYCERIN 0.4 MG SL tablet Commonly known as: NITROSTAT Place 1 tablet (0.4 mg total) under the tongue every 5 (five) minutes as needed for chest pain.   oxyCODONE 5 MG immediate release tablet Commonly known as: Oxy IR/ROXICODONE Take 1-2  tablets (5-10 mg total) by mouth every 6 (six) hours as needed for moderate pain (pain score 4-6). No more than 6 tablets per day   PACLITAXEL IV Inject into the vein every 21 ( twenty-one) days.   pregabalin 75 MG capsule Commonly known as: LYRICA TAKE 1 CAPSULE BY MOUTH THREE TIMES DAILY   prochlorperazine 10 MG tablet Commonly known as: COMPAZINE Take 1 tablet (10 mg total) by mouth every 6 (six) hours as needed (Nausea or vomiting).   sertraline 100 MG tablet Commonly known as: ZOLOFT Take 1 tablet by mouth once daily   Vitamin D3 25 MCG (1000 UT) Caps Take 1 capsule (1,000 Units total) by mouth daily.               Durable Medical Equipment  (From admission, onward)           Start     Ordered   09/19/22 1730  DME 3 n 1  Once        09/19/22 1729   09/19/22 1730  DME Walker rolling  Once       Question Answer Comment  Walker: With 5 Inch Wheels   Patient needs a walker to treat with the following condition Status post total replacement of right hip      09/19/22 1729            Discharge Assessment: Vitals:  09/21/22 0058 09/21/22 0511  BP: 132/70 127/61  Pulse: 72 67  Resp: 17 18  Temp: 98.3 F (36.8 C) 98.3 F (36.8 C)  SpO2: 95% 93%   Skin clean, dry and intact without evidence of skin break down, no evidence of skin tears noted. IV catheter discontinued intact. Site without signs and symptoms of complications - no redness or edema noted at insertion site, patient denies c/o pain - only slight tenderness at site.  Dressing with slight pressure applied.  D/c Instructions-Education: Discharge instructions given to patient/family with verbalized understanding. D/c education completed with patient/family including follow up instructions, medication list, d/c activities limitations if indicated, with other d/c instructions as indicated by MD - patient able to verbalize understanding, all questions fully answered. Patient instructed to return to  ED, call 911, or call MD for any changes in condition.  Patient escorted via WC, and D/C home via private auto.  Durene Dodge, Tilford Pillar, RN 09/21/2022 12:46 PM

## 2022-09-21 NOTE — Progress Notes (Signed)
Physical Therapy Treatment Patient Details Name: Jessica [name] MRN: [id number] DOB: Jan 12, 1961 Today's Date: 09/21/2022   History of Present Illness 62 yo female presents to therapy s/p R THA, anterior approach on 09/19/2022 due to failure of conservative measures. Pt PMH includes but is not limited to: angina, cholelithiasis, impaired vision,cervical pain, SIRS, hypotension, depression, anxiety, port a cath in situ, peripheral neuropathy, anemia, malignant neoplasm of peritoneum/endometrial, HTN, and HLD.    PT Comments  Pt amb ~ 32' with RW, able to progress in and out of bed with supervision for safety. Pt reports has been working on exercises on her own.  pt c/o "lightheadedness" and nausea which subsided end of session. VSS-->BP 119/57, HR 75, SpO2=95% on RA.  Purewick removed and discussed with pt that mobility is an important part of recovery and pt should be amb to bathroom as she will be doing this at home. Pt amb to bathroom during PT session and was supervision only for all aspects. Will notify RN of pt complaint of "lightheadedness". Given mobility level today pt is ready to d/c with dtr assisting from PT standpoint.   If plan is discharge home, recommend the following: A little help with walking and/or transfers;A little help with bathing/dressing/bathroom;Assistance with cooking/housework;Assist for transportation;Help with stairs or ramp for entrance   Can travel by private vehicle        Equipment Recommendations  Rolling walker (2 wheels)    Recommendations for Other Services       Precautions / Restrictions Precautions Precautions: Fall Restrictions Weight Bearing Restrictions: No RLE Weight Bearing: Weight bearing as tolerated     Mobility  Bed Mobility Overal bed mobility: Needs Assistance Bed Mobility: Supine to Sit, Sit to Supine     Supine to sit: Contact guard, Supervision Sit to supine: Contact guard assist, Supervision   General bed mobility  comments: pt able to use gait belt to progress RLE off and on bed wtih incr time    Transfers Overall transfer level: Needs assistance Equipment used: Rolling walker (2 wheels) Transfers: Sit to/from Stand Sit to Stand: Supervision           General transfer comment: STS from bed and BSC; cues for proper technique and UE placement    Ambulation/Gait Ambulation/Gait assistance: Supervision, Contact guard assist Gait Distance (Feet): 60 Feet (10' more) Assistive device: Rolling walker (2 wheels) Gait Pattern/deviations: Step-through pattern, Decreased weight shift to right Gait velocity: decreased     General Gait Details: step through gait with supervision for safety, no physical assist. pt c/o "lightheadedness" and nausea which subsided; VSS-BP 119/57, HR 75, SpO2=95% on RA   Stairs             Wheelchair Mobility     Tilt Bed    Modified Rankin (Stroke Patients Only)       Balance           Standing balance support: Single extremity supported, No upper extremity supported, During functional activity Standing balance-Leahy Scale: Fair (pt is able to perform peri-care and manipulate LBN garment with supervision for safety)                              Cognition Arousal: Alert Behavior During Therapy: [facility] for tasks assessed/performed Overall Cognitive Status: Within Functional Limits for tasks assessed  Exercises      General Comments        Pertinent Vitals/Pain Pain Assessment Pain Assessment: 0-10 Pain Score: 6  Pain Location: R hip and LE Pain Descriptors / Indicators: Aching, Constant, Discomfort, Operative site guarding Pain Intervention(s): Limited activity within patient's tolerance, Monitored during session, Premedicated before session    Home Living                          Prior Function            PT Goals (current goals can now be found in the  care plan section) Acute Rehab PT Goals Patient Stated Goal: to be able to dance at my 62th birthday party PT Goal Formulation: With patient Time For Goal Achievement: 10/03/22 Potential to Achieve Goals: Good Progress towards PT goals: Progressing toward goals    Frequency    7X/week      PT Plan      Co-evaluation              AM-PAC PT "6 Clicks" Mobility   Outcome Measure  Help needed turning from your back to your side while in a flat bed without using bedrails?: A Little Help needed moving from lying on your back to sitting on the side of a flat bed without using bedrails?: A Little Help needed moving to and from a bed to a chair (including a wheelchair)?: A Little Help needed standing up from a chair using your arms (e.g., wheelchair or bedside chair)?: A Little Help needed to walk in hospital room?: A Little Help needed climbing 3-5 steps with a railing? : A Little 6 Click Score: 18    End of Session Equipment Utilized During Treatment: Gait belt Activity Tolerance: Patient tolerated treatment well Patient left: in bed;with call bell/phone within reach;with bed alarm set Nurse Communication: Mobility status PT Visit Diagnosis: Unsteadiness on feet (R26.81);Other abnormalities of gait and mobility (R26.89);Muscle weakness (generalized) (M62.81);Pain;Difficulty in walking, not elsewhere classified (R26.2) Pain - Right/Left: Right Pain - part of body: Leg;Hip     Time: 1011-1040 PT Time Calculation (min) (ACUTE ONLY): 29 min  Charges:    $Gait Training: 8-22 mins $Therapeutic Activity: 8-22 mins PT General Charges $$ ACUTE PT VISIT: 1 Visit                     Tatianna Ibbotson, PT  Acute Rehab Dept [facility]) [phone]  09/21/2022    [facility] 09/21/2022, 10:46 AM

## 2022-09-23 ENCOUNTER — Encounter (HOSPITAL_COMMUNITY): Payer: Self-pay | Admitting: Orthopaedic Surgery

## 2022-09-30 ENCOUNTER — Inpatient Hospital Stay: Payer: 59 | Attending: Hematology

## 2022-09-30 ENCOUNTER — Ambulatory Visit (HOSPITAL_COMMUNITY)
Admission: RE | Admit: 2022-09-30 | Discharge: 2022-09-30 | Disposition: A | Discharge status: Home / Self Care | Payer: 59 | Source: Ambulatory Visit | Attending: Hematology | Admitting: Hematology

## 2022-09-30 DIAGNOSIS — C541 Malignant neoplasm of endometrium: Secondary | ICD-10-CM | POA: Insufficient documentation

## 2022-09-30 DIAGNOSIS — G629 Polyneuropathy, unspecified: Secondary | ICD-10-CM | POA: Insufficient documentation

## 2022-09-30 DIAGNOSIS — Z79899 Other long term (current) drug therapy: Secondary | ICD-10-CM | POA: Insufficient documentation

## 2022-09-30 DIAGNOSIS — C786 Secondary malignant neoplasm of retroperitoneum and peritoneum: Secondary | ICD-10-CM | POA: Insufficient documentation

## 2022-09-30 DIAGNOSIS — Z90710 Acquired absence of both cervix and uterus: Secondary | ICD-10-CM | POA: Insufficient documentation

## 2022-09-30 DIAGNOSIS — I10 Essential (primary) hypertension: Secondary | ICD-10-CM | POA: Insufficient documentation

## 2022-09-30 DIAGNOSIS — R928 Other abnormal and inconclusive findings on diagnostic imaging of breast: Secondary | ICD-10-CM | POA: Insufficient documentation

## 2022-09-30 LAB — COMPREHENSIVE METABOLIC PANEL
ALT: 20 U/L (ref 0–44)
AST: 15 U/L (ref 15–41)
Albumin: 3.4 g/dL — ABNORMAL LOW (ref 3.5–5.0)
Alkaline Phosphatase: 107 U/L (ref 38–126)
Anion gap: 10 (ref 5–15)
BUN: 13 mg/dL (ref 6–20)
CO2: 22 mmol/L (ref 22–32)
Calcium: 8.6 mg/dL — ABNORMAL LOW (ref 8.9–10.3)
Chloride: 103 mmol/L (ref 98–111)
Creatinine, Ser: 0.62 mg/dL (ref 0.44–1.00)
GFR, Estimated: >60 mL/min (ref >60)
Glucose, Bld: 125 mg/dL — ABNORMAL HIGH (ref 70–99)
Potassium: 3.5 mmol/L (ref 3.5–5.1)
Sodium: 135 mmol/L (ref 135–145)
Total Bilirubin: 0.7 mg/dL (ref 0.3–1.2)
Total Protein: 7 g/dL (ref 6.5–8.1)

## 2022-09-30 LAB — CBC WITH DIFFERENTIAL/PLATELET
Abs Immature Granulocytes: 0.04 10*3/uL (ref 0.00–0.07)
Basophils Absolute: 0.1 10*3/uL (ref 0.0–0.1)
Basophils Relative: 1 %
Eosinophils Absolute: 0.2 10*3/uL (ref 0.0–0.5)
Eosinophils Relative: 2 %
HCT: 32.6 % — ABNORMAL LOW (ref 36.0–46.0)
Hemoglobin: 10.5 g/dL — ABNORMAL LOW (ref 12.0–15.0)
Immature Granulocytes: 0 %
Lymphocytes Relative: 26 %
Lymphs Abs: 2.5 10*3/uL (ref 0.7–4.0)
MCH: 27.2 pg (ref 26.0–34.0)
MCHC: 32.2 g/dL (ref 30.0–36.0)
MCV: 84.5 fL (ref 80.0–100.0)
Monocytes Absolute: 0.6 10*3/uL (ref 0.1–1.0)
Monocytes Relative: 6 %
Neutro Abs: 6.1 10*3/uL (ref 1.7–7.7)
Neutrophils Relative %: 65 %
Platelets: 494 10*3/uL — ABNORMAL HIGH (ref 150–400)
RBC: 3.86 MIL/uL — ABNORMAL LOW (ref 3.87–5.11)
RDW: 15.7 % — ABNORMAL HIGH (ref 11.5–15.5)
WBC: 9.5 10*3/uL (ref 4.0–10.5)
nRBC: 0 % (ref 0.0–0.2)

## 2022-09-30 MED ORDER — HEPARIN SOD (PORK) LOCK FLUSH 100 UNIT/ML IV SOLN
500.0000 [IU] | Freq: Once | INTRAVENOUS | Status: AC
  Administered 2022-09-30: 500 [IU] via INTRAVENOUS

## 2022-09-30 MED ORDER — SODIUM CHLORIDE 0.9% FLUSH
10.0000 mL | Freq: Once | INTRAVENOUS | Status: AC
  Administered 2022-09-30: 10 mL via INTRAVENOUS

## 2022-09-30 MED ORDER — IOHEXOL 300 MG/ML  SOLN
100.0000 mL | Freq: Once | INTRAMUSCULAR | Status: AC | PRN
  Administered 2022-09-30: 100 mL via INTRAVENOUS

## 2022-09-30 NOTE — Progress Notes (Signed)
Patients port flushed without difficulty.  Good blood return noted with no bruising or swelling noted at site.  Band aid applied.  VSS with discharge and left in satisfactory condition with no s/s of distress noted.   

## 2022-09-30 NOTE — Patient Instructions (Signed)

## 2022-10-01 LAB — CA 125: Cancer Antigen (CA) 125: 7.7 U/mL (ref 0.0–38.1)

## 2022-10-02 ENCOUNTER — Ambulatory Visit (INDEPENDENT_AMBULATORY_CARE_PROVIDER_SITE_OTHER): Payer: 59 | Admitting: Orthopaedic Surgery

## 2022-10-02 ENCOUNTER — Other Ambulatory Visit: Payer: Self-pay | Admitting: Internal Medicine

## 2022-10-02 ENCOUNTER — Encounter: Payer: Self-pay | Admitting: Orthopaedic Surgery

## 2022-10-02 DIAGNOSIS — I10 Essential (primary) hypertension: Secondary | ICD-10-CM

## 2022-10-02 DIAGNOSIS — E785 Hyperlipidemia, unspecified: Secondary | ICD-10-CM

## 2022-10-02 DIAGNOSIS — Z96641 Presence of right artificial hip joint: Secondary | ICD-10-CM

## 2022-10-02 MED ORDER — OXYCODONE HCL 5 MG PO TABS: 5.0000 mg | ORAL_TABLET | Freq: Four times a day (QID) | ORAL | 0 refills | Status: DC | PRN

## 2022-10-02 NOTE — Progress Notes (Signed)
The patient comes in for her first postoperative visit status post a right total hip arthroplasty 2 weeks ago.  She reports good range of motion and strength and is ambulating with just a cane.  Her calf is soft on the right side.  Her right hip incision looks good and the staples are removed and Steri-Strips applied.  She is mobilizing well.  She will continue to increase her activities as comfort allows.  I did refill her oxycodone.  She knows to not drive for that in her system but she can drive when she is off narcotics.  Will see her back in a month to see how she is doing overall but no x-rays are needed.

## 2022-10-06 ENCOUNTER — Other Ambulatory Visit: Payer: Self-pay | Admitting: *Deleted

## 2022-10-06 DIAGNOSIS — C541 Malignant neoplasm of endometrium: Secondary | ICD-10-CM

## 2022-10-06 DIAGNOSIS — Z95828 Presence of other vascular implants and grafts: Secondary | ICD-10-CM

## 2022-10-06 MED ORDER — LIDOCAINE-PRILOCAINE 2.5-2.5 % EX CREA: TOPICAL_CREAM | CUTANEOUS | 0 refills | Status: DC

## 2022-10-06 NOTE — Progress Notes (Signed)
[facility] [address] [place] [address]    Clinic Day:  10/06/2022  Referring physician: Billie Lade, MD  Patient Care Team: Billie Lade, MD as PCP - General (Internal Medicine) Mallipeddi, Orion Modest, MD as PCP - Cardiology (Cardiology) Mickie Bail, RN as Oncology Nurse Navigator Doreatha Massed, MD as Medical Oncologist (Oncology)   ASSESSMENT & PLAN:   Assessment: 1.  Recurrent endometrial carcinosarcoma: -TAH, BSO, bilateral pelvic and para-aortic lymph node resections on 04/13/2018. -Pathology showed carcinosarcoma (malignant mixed mullerian tumor) is arising in an endometrial type polyp with no myometrial invasion identified.  High-grade.  0/39 lymph nodes positive.  PT1APN0, FIGO stage Ia.  MMR normal.  MSI-stable. -CTAP on 12/09/2018 for abdominal pain showed extensive peritoneal carcinomatosis and large soft tissue mass in the pelvis. -Biopsy of the omental mass on 12/28/2018 shows poorly differentiated carcinoma consistent with her prior malignancy. -Carboplatin and paclitaxel started on 12/29/2018. -CT scan on 05/02/2019 showed peritoneal implants in the low central small bowel mesentery and left pelvic sidewall have decreased in size measuring 1.5 x 1.9 cm and 1.7 x 2.2 cm.  Soft tissue nodule in the left lateral omentum measures 1 x 1.7 cm with no evidence of metastatic disease. -Continuation of chemotherapy until complete response was recommended. -CT AP on 07/04/2019 showed continued positive response to therapy with scattered peritoneal metastasis in the pelvis and left omentum decreased in size.  No new metastatic disease. -CTAP on 10/24/2019 after 14 cycles showed substantial reduction in size of soft tissue nodules in the pelvis.  1.4 x 0.9 x 1 cm soft tissue density in the inferior serosal margin of the sigmoid colon, previously 1.7 x 1.1 x 1.7 cm.  2 small soft tissue nodules in the mesenteric adipose tissue above the urinary  bladder measures 0.8 cm. -She has developed serious allergic reaction during cycle 14 with carboplatin. -I have discussed with Dr. Andrey Farmer.  Other options include adding ifosfamide to Taxol or Doxil and combination of lenvatinib with pembrolizumab.  In the lenvatinib pembrolizumab trial carcinosarcomas were not included. -Single agent paclitaxel started on 11/09/2019, dose reduced by 20% on 01/18/2020.  Paclitaxel held on 08/22/2021 as a chemo holiday. -CTAP on 01/10/2020 with stable small peritoneal soft tissue nodules in the pelvis.  Resolution of soft tissue nodularity in the left abdominal omental fat with no new or progressive disease.    Plan: 1.  Recurrent endometrial carcinosarcoma: - Denies any abdominal pain.  Has loin pains occasionally. - Reviewed labs from 05/26/2022: CA125 was normal.  LFTs show elevated alk phos of 131.  Rest of the CMP was normal. - CTAP on 05/26/2022: Stable small pelvic nodes in the mesenteric nodules in the pelvis with no new lesions. - She has not been receiving Taxol since 08/22/2021. - She has been doing very well.  I have recommended follow-up in 4 months with repeat CTAP with contrast and CA125.  After that I will switch her to every 57-month CT scan and every 34-month visit with tumor marker.   2.  Low back pain/right-sided abdominal pain: - Right hip pain more than left hip pain.  Continue oxycodone as needed.   3.  Hypertension: - Continue Norvasc 5 mg daily.  Blood pressure is stable.   4.  Neuropathy in the legs: - Continue Lyrica 75 mg 3 times daily.   5.  Abnormal mammogram: - Last mammogram was 06/19/2021, BI-RADS Category 1.  She will have another mammogram this year.  6.  Hypomagnesemia: - Continue magnesium 1 tablet daily.  Magnesium is normal.   No orders of the defined types were placed in this encounter.    Alben Deeds Teague,acting as a Neurosurgeon for Doreatha Massed, MD.,have documented all relevant documentation on the behalf of Doreatha Massed, MD,as directed by  Doreatha Massed, MD while in the presence of Doreatha Massed, MD.  ***   Edison R Teague   9/16/20249:56 PM  CHIEF COMPLAINT:   Diagnosis: recurrent endometrial cancer    Cancer Staging  No matching staging information was found for the patient.    Prior Therapy: 1. TAH & BSO, LNDs on 04/13/2018 2. Carboplatin/Paclitaxel, 12/29/18 - 10/05/19 3. Maintenance Paclitaxel, last dose on 08/22/2021  Current Therapy:  surveillance   HISTORY OF PRESENT ILLNESS:   Oncology History  Endometrial cancer (HCC)  03/31/2018 Initial Diagnosis   Endometrial cancer (HCC)   12/29/2018 -  Chemotherapy   Patient is on Treatment Plan : UTERINE Carboplatin AUC 6 / Paclitaxel q21d     06/06/2020 Miscellaneous   Summary of oncologic history from Dr. Ellin Saba   1.  Recurrent endometrial carcinosarcoma: -TAH, BSO, bilateral pelvic and para-aortic lymph node resections on 04/13/2018. -Pathology showed carcinosarcoma (malignant mixed mullerian tumor) is arising in an endometrial type polyp with no myometrial invasion identified.  High-grade.  0/39 lymph nodes positive.  PT1APN0, FIGO stage Ia.  MMR normal.  MSI-stable. -CTAP on 12/09/2018 for abdominal pain showed extensive peritoneal carcinomatosis and large soft tissue mass in the pelvis. -Biopsy of the omental mass on 12/28/2018 shows poorly differentiated carcinoma consistent with her prior malignancy. -Carboplatin and paclitaxel started on 12/29/2018. -CT scan on 05/02/2019 showed peritoneal implants in the low central small bowel mesentery and left pelvic sidewall have decreased in size measuring 1.5 x 1.9 cm and 1.7 x 2.2 cm.  Soft tissue nodule in the left lateral omentum measures 1 x 1.7 cm with no evidence of metastatic disease. -Continuation of chemotherapy until complete response was recommended. -CT AP on 07/04/2019 showed continued positive response to therapy with scattered peritoneal metastasis in the pelvis  and left omentum decreased in size.  No new metastatic disease. -CTAP on 10/24/2019 after 14 cycles showed substantial reduction in size of soft tissue nodules in the pelvis.  1.4 x 0.9 x 1 cm soft tissue density in the inferior serosal margin of the sigmoid colon, previously 1.7 x 1.1 x 1.7 cm.  2 small soft tissue nodules in the mesenteric adipose tissue above the urinary bladder measures 0.8 cm. -She has developed serious allergic reaction during cycle 14 with carboplatin. -I have discussed with Dr. Andrey Farmer.  Other options include adding ifosfamide to Taxol or Doxil and combination of lenvatinib with pembrolizumab.  In the lenvatinib pembrolizumab trial carcinosarcomas were not included. -Single agent paclitaxel started on 11/09/2019, dose reduced by 20% on 01/18/2020. -CTAP on 01/10/2020 with stable small peritoneal soft tissue nodules in the pelvis.  Resolution of soft tissue nodularity in the left abdominal omental fat with no new or progressive disease.     06/20/2020 Imaging   1. Stable examination status post hysterectomy without suspicious enhancing soft tissue nodularity at the vaginal cuff and no significant change in the multiple small soft tissue nodules in the pelvis likely representing treated tumor. No new or progressive findings. 2. Cholelithiasis including a large gallstone in the neck of the gallbladder but without CT evidence of acute cholecystitis. 3. Small hiatal hernia. 4. Aortic atherosclerosis.        INTERVAL HISTORY:  Aliayah is a 62 y.o. female presenting to clinic today for follow up of recurrent endometrial cancer. She was last seen by me on 06/02/22.  Since her last visit, she underwent right total hip arthroplasty on 09/19/22 with Dr. Magnus Ivan. She was seen in the ED on 08/07/22 for dizziness and given information on the Epley maneuver to try at home.   She also had a CT A/P on 09/30/22 that found: stable  tiny sub-centimeter left external iliac lymph node and central  pelvic peritoneal nodule; no new or progressive disease; cholelithiasis with no radiographic evidence of cholecystitis; and stable small hiatal hernia.  Today, she states that she is doing well overall. Her appetite level is at ***%. Her energy level is at ***%.  PAST MEDICAL HISTORY:   Past Medical History: Past Medical History:  Diagnosis Date   Allergy    Anemia    Anxiety    Arthritis    Cancer (HCC)    Phreesia 09/05/2019   Cancer of skin of back    Chest pain    Depression    Elevated blood pressure reading    Endometrial cancer (HCC)    Hyperlipidemia    Hypertension    PMB (postmenopausal bleeding)    Port-A-Cath in place 12/24/2018   Pre-diabetes     Surgical History: Past Surgical History:  Procedure Laterality Date   ABDOMINAL HYSTERECTOMY N/A    Phreesia 09/05/2019   IR IMAGING GUIDED PORT INSERTION  12/28/2018   IR RADIOLOGIST EVAL & MGMT  06/02/2018   IR RADIOLOGIST EVAL & MGMT  06/16/2018   NECK SURGERY  2000   spinal surgery , titanium rod in place    ROBOTIC ASSISTED TOTAL HYSTERECTOMY WITH BILATERAL SALPINGO OOPHERECTOMY N/A 04/13/2018   Procedure: XI ROBOTIC ASSISTED TOTAL HYSTERECTOMY WITH BILATERAL SALPINGO OOPHORECTOMY;  Surgeon: Adolphus Birchwood, MD;  Location: [facility];  Service: Gynecology;  Laterality: N/A;   ROBOTIC PELVIC AND PARA-AORTIC LYMPH NODE DISSECTION N/A 04/13/2018   Procedure: XI ROBOTIC PELVIC LYMPHADECTOMY AND PARA-AORTIC LYMPH NODE DISSECTION;  Surgeon: Adolphus Birchwood, MD;  Location: [facility];  Service: Gynecology;  Laterality: N/A;   TOTAL HIP ARTHROPLASTY Right 09/19/2022   Procedure: RIGHT TOTAL HIP ARTHROPLASTY ANTERIOR APPROACH;  Surgeon: Kathryne Hitch, MD;  Location: [facility];  Service: Orthopedics;  Laterality: Right;    Social History: Social History   Socioeconomic History   Marital status: Widowed    Spouse name: Not on file   Number of children: 3   Years of education: Not on file   Highest education level: 12th grade   Occupational History   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  Tobacco Use   Smoking status: Never   Smokeless tobacco: Never  Vaping Use   Vaping status: Never Used  Substance and Sexual Activity   Alcohol use: Yes    Alcohol/week: 4.0 standard drinks of alcohol    Types: 4 Glasses of wine per week    Comment: weekends   Drug use: Never   Sexual activity: Not Currently  Other Topics Concern   Not on file  Social History Narrative      Wears seat belt    Does not use phone while driving    Smoke detectors at home   Fire extinguisher-no   No weapons   Social Determinants of Health   Financial Resource Strain: Low Risk  (07/05/2022)   Overall Financial Resource Strain (CARDIA)    Difficulty of Paying Living Expenses: Not very  hard  Food Insecurity: No Food Insecurity (09/19/2022)   Hunger Vital Sign    Worried About Running Out of Food in the Last Year: Never true    Ran Out of Food in the Last Year: Never true  Transportation Needs: No Transportation Needs (09/19/2022)   PRAPARE - Administrator, Civil Service (Medical): No    Lack of Transportation (Non-Medical): No  Physical Activity: Insufficiently Active (07/05/2022)   Exercise Vital Sign    Days of Exercise per Week: 2 days    Minutes of Exercise per Session: 20 min  Stress: No Stress Concern Present (07/05/2022)   [employer] of Occupational Health - Occupational Stress Questionnaire    Feeling of Stress : Only a little  Social Connections: Socially Isolated (07/05/2022)   Social Connection and Isolation Panel [NHANES]    Frequency of Communication with Friends and Family: Three times a week    Frequency of Social Gatherings with Friends and Family: Once a week    Attends Religious Services: Never    Database administrator or Organizations: No    Attends Banker Meetings: Never    Marital Status: Widowed  Intimate Partner Violence: Not At Risk (09/19/2022)   Humiliation, Afraid,  Rape, and Kick questionnaire    Fear of Current or Ex-Partner: No    Emotionally Abused: No    Physically Abused: No    Sexually Abused: No    Family History: Family History  Problem Relation Age of Onset   Hypertension Mother    Breast cancer Mother    Hypertension Father    Heart disease Father    Cancer Father        lung   Cancer Sister        ovarian cancer   Melanoma Sister     Current Medications:  Current Outpatient Medications:    acetaminophen (TYLENOL) 500 MG tablet, Take 1,000 mg by mouth every 6 (six) hours as needed (pain.)., Disp: , Rfl:    amLODipine (NORVASC) 5 MG tablet, Take 1 tablet by mouth once daily, Disp: 90 tablet, Rfl: 0   atorvastatin (LIPITOR) 20 MG tablet, Take 1 tablet by mouth once daily, Disp: 90 tablet, Rfl: 0   Cholecalciferol (VITAMIN D3) 25 MCG (1000 UT) CAPS, Take 1 capsule (1,000 Units total) by mouth daily., Disp: 60 capsule, Rfl: 2   EPINEPHrine 0.3 mg/0.3 mL IJ SOAJ injection, Inject 0.3 mg into the muscle as needed for anaphylaxis., Disp: 1 each, Rfl: 0   ibuprofen (ADVIL) 200 MG tablet, Take 400 mg by mouth every 8 (eight) hours as needed (pain.)., Disp: , Rfl:    lidocaine-prilocaine (EMLA) cream, Apply to port site prior to use, Disp: 30 g, Rfl: 0   MAGnesium-Oxide 400 (240 Mg) MG tablet, Take 1 tablet by mouth twice daily, Disp: 60 tablet, Rfl: 6   meclizine (ANTIVERT) 25 MG tablet, Take 1 tablet (25 mg total) by mouth 3 (three) times daily as needed for dizziness., Disp: 30 tablet, Rfl: 0   methocarbamol (ROBAXIN) 500 MG tablet, Take 1 tablet (500 mg total) by mouth every 6 (six) hours as needed for muscle spasms., Disp: 40 tablet, Rfl: 1   nitroGLYCERIN (NITROSTAT) 0.4 MG SL tablet, Place 1 tablet (0.4 mg total) under the tongue every 5 (five) minutes as needed for chest pain., Disp: 25 tablet, Rfl: 3   oxyCODONE (OXY IR/ROXICODONE) 5 MG immediate release tablet, Take 1-2 tablets (5-10 mg total) by mouth every 6 (six) hours  as needed  for moderate pain (pain score 4-6). No more than 6 tablets per day, Disp: 30 tablet, Rfl: 0   PACLITAXEL IV, Inject into the vein every 21 ( twenty-one) days., Disp: , Rfl:    pregabalin (LYRICA) 75 MG capsule, TAKE 1 CAPSULE BY MOUTH THREE TIMES DAILY, Disp: 90 capsule, Rfl: 3   prochlorperazine (COMPAZINE) 10 MG tablet, Take 1 tablet (10 mg total) by mouth every 6 (six) hours as needed (Nausea or vomiting)., Disp: 60 tablet, Rfl: 3   sertraline (ZOLOFT) 100 MG tablet, Take 1 tablet by mouth once daily, Disp: 30 tablet, Rfl: 0 No current facility-administered medications for this visit.  Facility-Administered Medications Ordered in Other Visits:    diphenhydrAMINE (BENADRYL) 50 MG/ML injection, , , ,    palonosetron (ALOXI) 0.25 MG/5ML injection, , , ,    Allergies: Allergies  Allergen Reactions   Carboplatin Other (See Comments)    "body on fire and abdominal pain"   Nickel Rash    itchy    REVIEW OF SYSTEMS:   Review of Systems  Constitutional:  Negative for chills, fatigue and fever.  HENT:   Negative for lump/mass, mouth sores, nosebleeds, sore throat and trouble swallowing.   Eyes:  Negative for eye problems.  Respiratory:  Negative for cough and shortness of breath.   Cardiovascular:  Negative for chest pain, leg swelling and palpitations.  Gastrointestinal:  Negative for abdominal pain, constipation, diarrhea, nausea and vomiting.  Genitourinary:  Negative for bladder incontinence, difficulty urinating, dysuria, frequency, hematuria and nocturia.   Musculoskeletal:  Negative for arthralgias, back pain, flank pain, myalgias and neck pain.  Skin:  Negative for itching and rash.  Neurological:  Negative for dizziness, headaches and numbness.  Hematological:  Does not bruise/bleed easily.  Psychiatric/Behavioral:  Negative for depression, sleep disturbance and suicidal ideas. The patient is not nervous/anxious.   All other systems reviewed and are negative.    VITALS:    There were no vitals taken for this visit.  Wt Readings from Last 3 Encounters:  09/19/22 223 lb (101.2 kg)  09/11/22 223 lb (101.2 kg)  08/13/22 225 lb (102.1 kg)    There is no height or weight on file to calculate BMI.  Performance status (ECOG): 1 - Symptomatic but completely ambulatory  PHYSICAL EXAM:   Physical Exam Vitals and nursing note reviewed. Exam conducted with a chaperone present.  Constitutional:      Appearance: Normal appearance.  Cardiovascular:     Rate and Rhythm: Normal rate and regular rhythm.     Pulses: Normal pulses.     Heart sounds: Normal heart sounds.  Pulmonary:     Effort: Pulmonary effort is normal.     Breath sounds: Normal breath sounds.  Abdominal:     Palpations: Abdomen is soft. There is no hepatomegaly, splenomegaly or mass.     Tenderness: There is no abdominal tenderness.  Musculoskeletal:     Right lower leg: No edema.     Left lower leg: No edema.  Lymphadenopathy:     Cervical: No cervical adenopathy.     Right cervical: No superficial, deep or posterior cervical adenopathy.    Left cervical: No superficial, deep or posterior cervical adenopathy.     Upper Body:     Right upper body: No supraclavicular or axillary adenopathy.     Left upper body: No supraclavicular or axillary adenopathy.  Neurological:     General: No focal deficit present.     Mental Status: She  is alert and oriented to person, place, and time.  Psychiatric:        Mood and Affect: Mood normal.        Behavior: Behavior normal.     LABS:      Latest Ref Rng & Units 09/30/2022   11:43 AM 09/20/2022    3:54 AM 09/11/2022    1:22 PM  CBC  WBC 4.0 - 10.5 K/uL 9.5  13.5  8.7   Hemoglobin 12.0 - 15.0 g/dL 09.8  11.9  14.7   Hematocrit 36.0 - 46.0 % 32.6  33.2  42.4   Platelets 150 - 400 K/uL 494  242  304       Latest Ref Rng & Units 09/30/2022   11:43 AM 09/20/2022    3:54 AM 09/11/2022    1:22 PM  CMP  Glucose 70 - 99 mg/dL 829  562  95   BUN 6 -  20 mg/dL 13  14  19    Creatinine 0.44 - 1.00 mg/dL 1.30  8.65  7.84   Sodium 135 - 145 mmol/L 135  138  139   Potassium 3.5 - 5.1 mmol/L 3.5  3.8  4.2   Chloride 98 - 111 mmol/L 103  106  105   CO2 22 - 32 mmol/L 22  24  25    Calcium 8.9 - 10.3 mg/dL 8.6  8.5  9.2   Total Protein 6.5 - 8.1 g/dL 7.0   7.4   Total Bilirubin 0.3 - 1.2 mg/dL 0.7   0.5   Alkaline Phos 38 - 126 U/L 107   112   AST 15 - 41 U/L 15   21   ALT 0 - 44 U/L 20   22      No results found for: "CEA1", "CEA" / No results found for: "CEA1", "CEA" No results found for: "PSA1" No results found for: "CAN199" Lab Results  Component Value Date   CAN125 7.7 09/30/2022    No results found for: "TOTALPROTELP", "ALBUMINELP", "A1GS", "A2GS", "BETS", "BETA2SER", "GAMS", "MSPIKE", "SPEI" No results found for: "TIBC", "FERRITIN", "IRONPCTSAT" Lab Results  Component Value Date   LDH 160 02/09/2019   LDH 331 (H) 12/22/2018     STUDIES:   CT Abdomen Pelvis W Contrast  Result Date: 10/06/2022 CLINICAL DATA:  Follow-up recurrent endometrial carcinosarcoma. * Tracking Code: BO * EXAM: CT ABDOMEN AND PELVIS WITH CONTRAST TECHNIQUE: Multidetector CT imaging of the abdomen and pelvis was performed using the standard protocol following bolus administration of intravenous contrast. RADIATION DOSE REDUCTION: This exam was performed according to the departmental dose-optimization program which includes automated exposure control, adjustment of the mA and/or kV according to patient size and/or use of iterative reconstruction technique. CONTRAST:  OMNIPAQUE IOHEXOL 300 MG/ML  SOLN COMPARISON:  05/26/2022 FINDINGS: Lower Chest: No acute findings. Hepatobiliary: No suspicious hepatic masses identified. Gallstones are seen, however there is no evidence of cholecystitis or biliary dilatation. Pancreas:  No mass or inflammatory changes. Spleen: Within normal limits in size and appearance. Adrenals/Urinary Tract: No suspicious masses  identified. No evidence of ureteral calculi or hydronephrosis. Urinary bladder is nearly completely empty. Stomach/Bowel: Small hiatal hernia again noted. No evidence of obstruction, inflammatory process or abnormal fluid collections. Normal appendix visualized. Vascular/Lymphatic: No pathologically enlarged lymph nodes. Stable 7 mm left external iliac lymph node on image 68/2. No acute vascular findings. Reproductive: Prior hysterectomy noted. Tiny peritoneal nodule in the central pelvis measuring 4 mm on image 69/2, is also stable. No  other masses identified. No evidence of free fluid. Other:  None. Musculoskeletal: No suspicious bone lesions identified. Right hip prosthesis noted. IMPRESSION: Stable tiny sub-centimeter left external iliac lymph node and central pelvic peritoneal nodule. No new or progressive disease. Cholelithiasis. No radiographic evidence of cholecystitis. Stable small hiatal hernia. Electronically Signed   By: Danae Orleans M.D.   On: 10/06/2022 15:59   DG Pelvis Portable  Result Date: 09/19/2022 CLINICAL DATA:  Status post right hip arthroplasty. EXAM: PORTABLE PELVIS 1-2 VIEWS COMPARISON:  Pelvis and right hip radiographs 07/09/2022 FINDINGS: Interval total right hip arthroplasty. No perihardware lucency is seen to indicate hardware failure or loosening. Mild left femoroacetabular joint space narrowing and peripheral osteophytosis. Mild-to-moderate superior pubic symphysis joint space narrowing with subchondral sclerosis and mild peripheral osteophytosis. Mild bilateral sacroiliac subchondral sclerosis. No acute fracture or dislocation. Expected postoperative subcutaneous air about the right hip. IMPRESSION: Interval total right hip arthroplasty without evidence of hardware failure or loosening. Electronically Signed   By: Neita Garnet M.D.   On: 09/19/2022 14:39   DG HIP UNILAT WITH PELVIS 1V RIGHT  Result Date: 09/19/2022 CLINICAL DATA:  Total right hip arthroplasty. Intraoperative  fluoroscopy. EXAM: DG HIP (WITH OR WITHOUT PELVIS) 1V RIGHT COMPARISON:  Pelvis and right hip radiographs 07/09/2022 FINDINGS: Images were performed intraoperatively without the presence of a radiologist. Severe superior right femoroacetabular joint space narrowing and peripheral osteophytosis. The patient is undergoing total right hip arthroplasty. No hardware complication is seen. Total fluoroscopy images: 8 Total fluoroscopy time: 18 seconds Total dose: Radiation Exposure Index (as provided by the fluoroscopic device): 2.74 mGy air Kerma Please see intraoperative findings for further detail. IMPRESSION: Intraoperative fluoroscopy for total right hip arthroplasty. Electronically Signed   By: Neita Garnet M.D.   On: 09/19/2022 14:38   DG C-Arm 1-60 Min-No Report  Result Date: 09/19/2022 Fluoroscopy was utilized by the requesting physician.  No radiographic interpretation.   DG C-Arm 1-60 Min-No Report  Result Date: 09/19/2022 Fluoroscopy was utilized by the requesting physician.  No radiographic interpretation.

## 2022-10-07 ENCOUNTER — Other Ambulatory Visit: Payer: Self-pay | Admitting: Internal Medicine

## 2022-10-07 ENCOUNTER — Telehealth: Payer: Self-pay | Admitting: Orthopaedic Surgery

## 2022-10-07 ENCOUNTER — Inpatient Hospital Stay (HOSPITAL_BASED_OUTPATIENT_CLINIC_OR_DEPARTMENT_OTHER): Payer: 59 | Admitting: Hematology

## 2022-10-07 VITALS — BP 122/82 | HR 77 | Temp 97.9°F | Resp 16 | Wt 220.6 lb

## 2022-10-07 DIAGNOSIS — C541 Malignant neoplasm of endometrium: Secondary | ICD-10-CM | POA: Diagnosis not present

## 2022-10-07 DIAGNOSIS — C786 Secondary malignant neoplasm of retroperitoneum and peritoneum: Secondary | ICD-10-CM | POA: Diagnosis present

## 2022-10-07 DIAGNOSIS — F419 Anxiety disorder, unspecified: Secondary | ICD-10-CM

## 2022-10-07 DIAGNOSIS — I10 Essential (primary) hypertension: Secondary | ICD-10-CM | POA: Diagnosis not present

## 2022-10-07 DIAGNOSIS — Z90710 Acquired absence of both cervix and uterus: Secondary | ICD-10-CM | POA: Diagnosis not present

## 2022-10-07 DIAGNOSIS — R928 Other abnormal and inconclusive findings on diagnostic imaging of breast: Secondary | ICD-10-CM | POA: Diagnosis not present

## 2022-10-07 DIAGNOSIS — F321 Major depressive disorder, single episode, moderate: Secondary | ICD-10-CM

## 2022-10-07 DIAGNOSIS — G629 Polyneuropathy, unspecified: Secondary | ICD-10-CM | POA: Diagnosis not present

## 2022-10-07 DIAGNOSIS — Z79899 Other long term (current) drug therapy: Secondary | ICD-10-CM | POA: Diagnosis not present

## 2022-10-07 NOTE — Telephone Encounter (Signed)
[employer] forms received. To [employer].

## 2022-10-07 NOTE — Patient Instructions (Signed)
[facility] at [facility] Discharge Instructions   You were seen and examined today by Dr. Ellin Saba.  He reviewed the results of your lab work which are normal/stable.   He reviewed the results of your CT scan which is stable.   We will see you back in 6 months. We will repeat a CT scan and lab work prior to this visit.   Return as scheduled.    Thank you for choosing [facility] at [facility] to provide your oncology and hematology care.  To afford each patient quality time with our provider, please arrive at least 15 minutes before your scheduled appointment time.   If you have a lab appointment with the Cancer Center please come in thru the Main Entrance and check in at the main information desk.  You need to re-schedule your appointment should you arrive 10 or more minutes late.  We strive to give you quality time with our providers, and arriving late affects you and other patients whose appointments are after yours.  Also, if you no show three or more times for appointments you may be dismissed from the clinic at the providers discretion.     Again, thank you for choosing [facility].  Our hope is that these requests will decrease the amount of time that you wait before being seen by our physicians.       _____________________________________________________________  Should you have questions after your visit to [facility], please contact our office at [phone] and follow the prompts.  Our office hours are 8:00 a.m. and 4:30 p.m. Monday - Friday.  Please note that voicemails left after 4:00 p.m. may not be returned until the following business day.  We are closed weekends and major holidays.  You do have access to a nurse 24-7, just call the main number to the clinic [phone] and do not press any options, hold on the line and a nurse will answer the phone.    For prescription refill requests, have your  pharmacy contact our office and allow 72 hours.    Due to Covid, you will need to wear a mask upon entering the hospital. If you do not have a mask, a mask will be given to you at the Main Entrance upon arrival. For doctor visits, patients may have 1 support person age 92 or older with them. For treatment visits, patients can not have anyone with them due to social distancing guidelines and our immunocompromised population.

## 2022-10-08 ENCOUNTER — Other Ambulatory Visit: Payer: Self-pay

## 2022-11-03 ENCOUNTER — Encounter: Payer: Self-pay | Admitting: Orthopaedic Surgery

## 2022-11-03 ENCOUNTER — Ambulatory Visit: Payer: 59 | Admitting: Orthopaedic Surgery

## 2022-11-03 DIAGNOSIS — Z96641 Presence of right artificial hip joint: Secondary | ICD-10-CM

## 2022-11-03 NOTE — Progress Notes (Signed)
The patient is now between 6 and 8 weeks status post a right total hip arthroplasty.  She says she is doing very well overall.  She is only 61 years old.  She works as a Theatre stage manager.  She feels like she is ready to go back to work starting November 1 and I agree with this as well.  She still walks with a slight limp and her right hip is still stiff with rotation but it is improving significantly.  Her incisions healed nicely.  From my standpoint we will see her back in 3 months with a standing low AP pelvis and lateral of her right operative hip.  I did give her a note for return to work starting November 1.

## 2022-11-10 ENCOUNTER — Other Ambulatory Visit: Payer: Self-pay | Admitting: Internal Medicine

## 2022-11-10 DIAGNOSIS — F321 Major depressive disorder, single episode, moderate: Secondary | ICD-10-CM

## 2022-11-10 DIAGNOSIS — F419 Anxiety disorder, unspecified: Secondary | ICD-10-CM

## 2022-11-24 ENCOUNTER — Telehealth: Payer: Self-pay | Admitting: Internal Medicine

## 2022-11-24 NOTE — Telephone Encounter (Signed)
Patient came into the office asking does she need to do blood work, if so is it fasting. Call patient to let her know at [phone]

## 2022-11-24 NOTE — Telephone Encounter (Signed)
None is needed before her appt. Some may be ordered at her appt but that will be up to the provider. Thanks

## 2022-11-26 NOTE — Telephone Encounter (Signed)
Sent patient mychart message and left voicemail

## 2022-11-28 IMAGING — MG MM DIGITAL SCREENING BILAT W/ TOMO AND CAD
6 of 10 series · 6 of 30 positions shown · non-contrast
Comparison: Previous exam(s).

CLINICAL DATA: Screening.

EXAM:
DIGITAL SCREENING BILATERAL MAMMOGRAM WITH TOMOSYNTHESIS AND CAD
TECHNIQUE: Bilateral screening digital craniocaudal and mediolateral oblique
mammograms were obtained. Bilateral screening digital breast
tomosynthesis was performed. The images were evaluated with
computer-aided detection.

[L CC synth-2D]
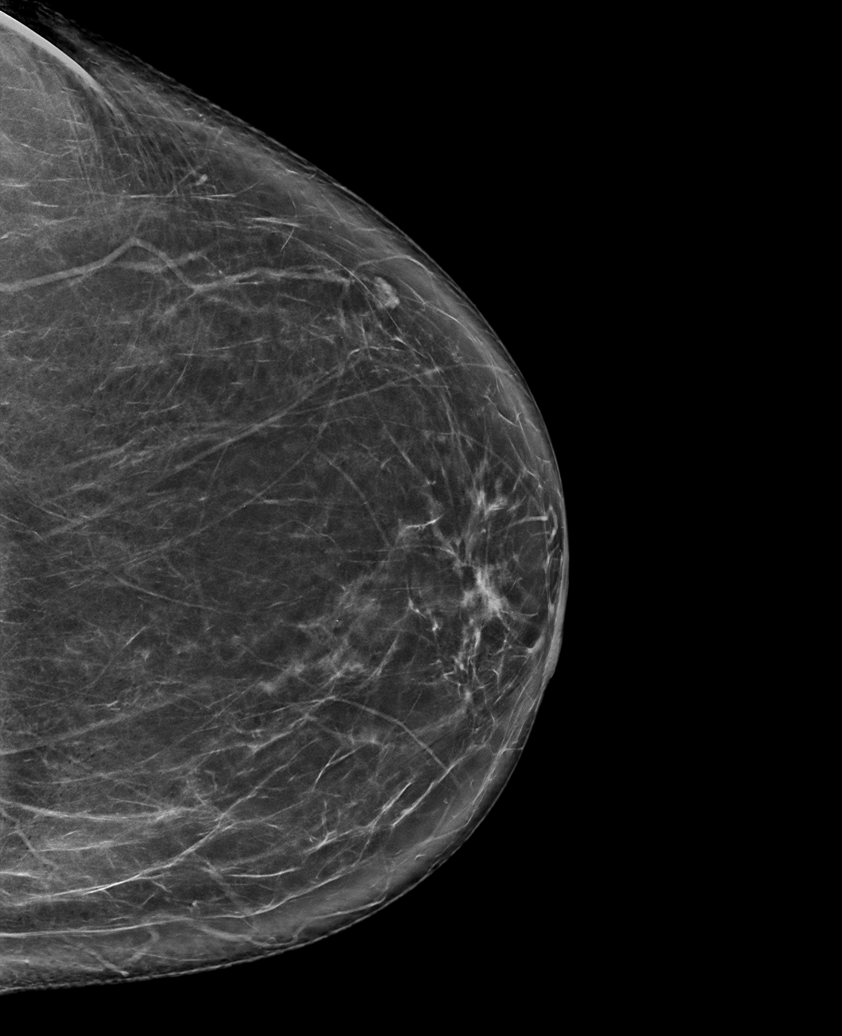

[R MLO synth-2D]
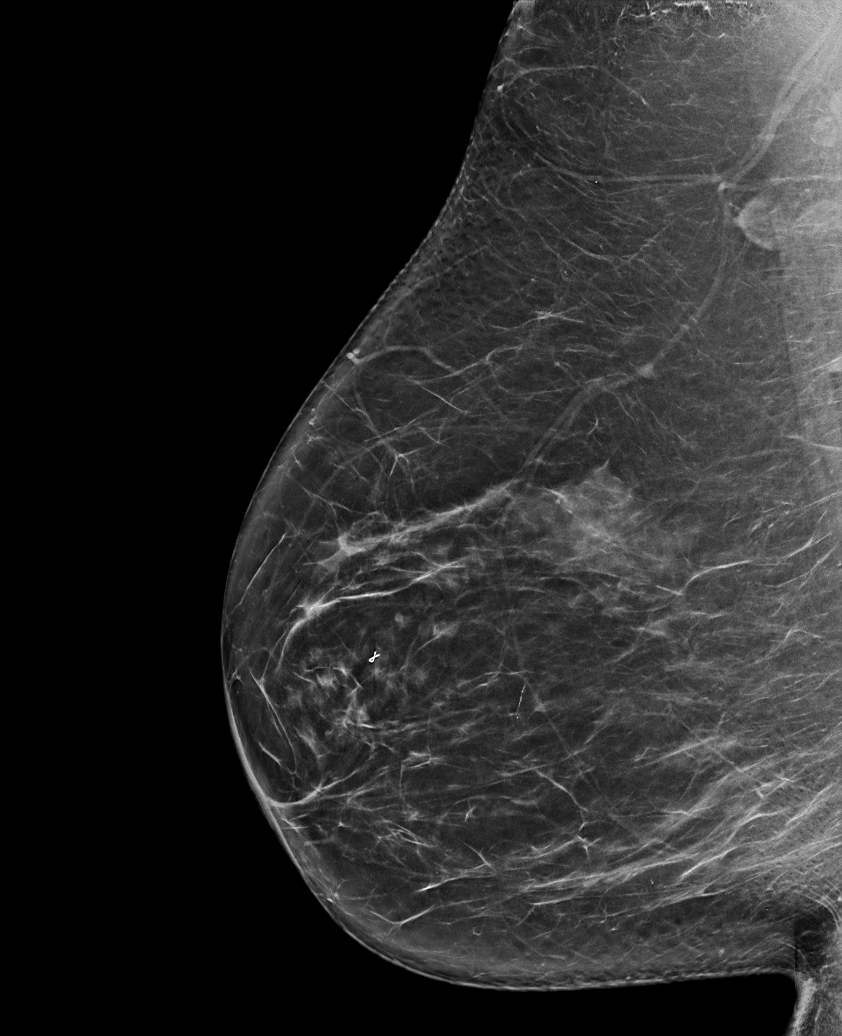

[R CC synth-2D]
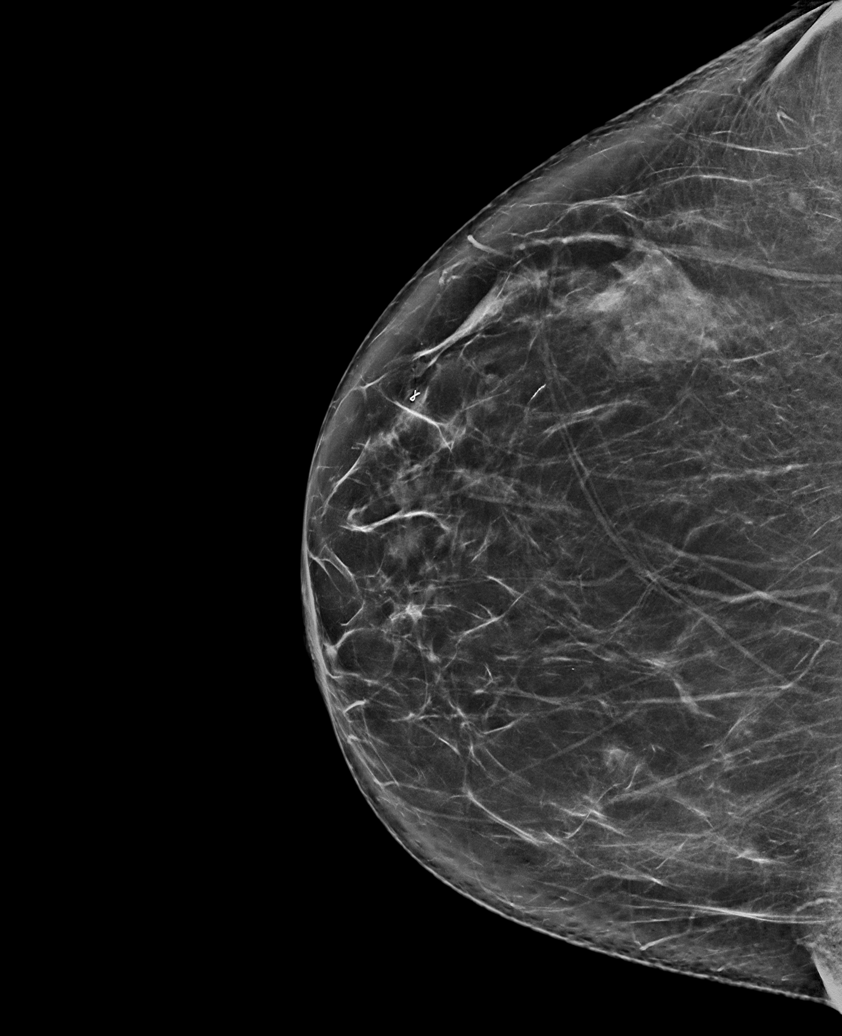

[L MLO synth-2D (1 of 2)]
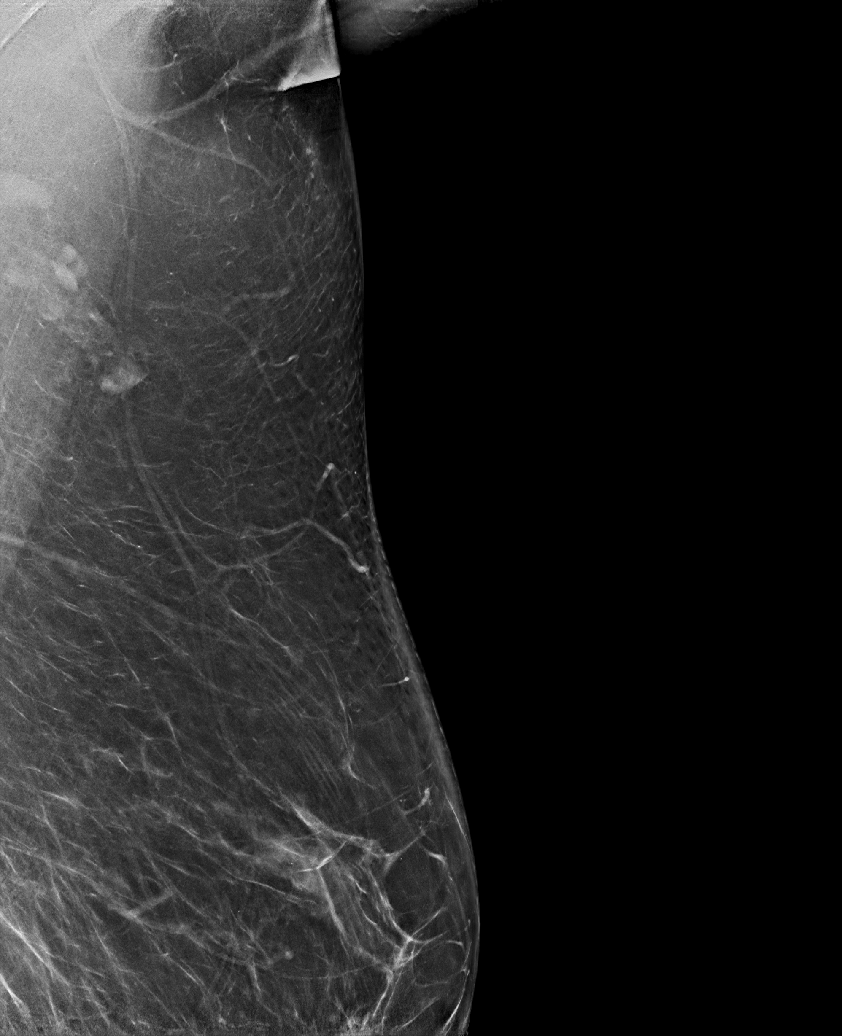

[L MLO synth-2D (2 of 2)]
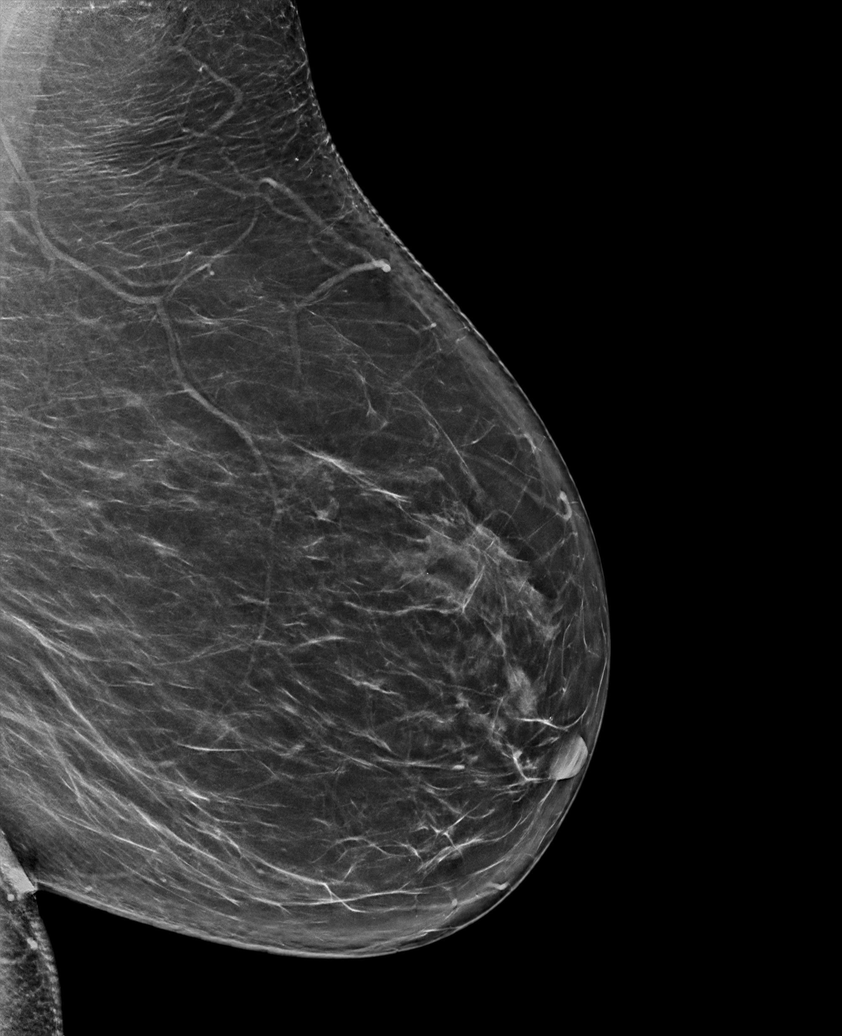

[L CC tomo · tomo slice 44/87.0]
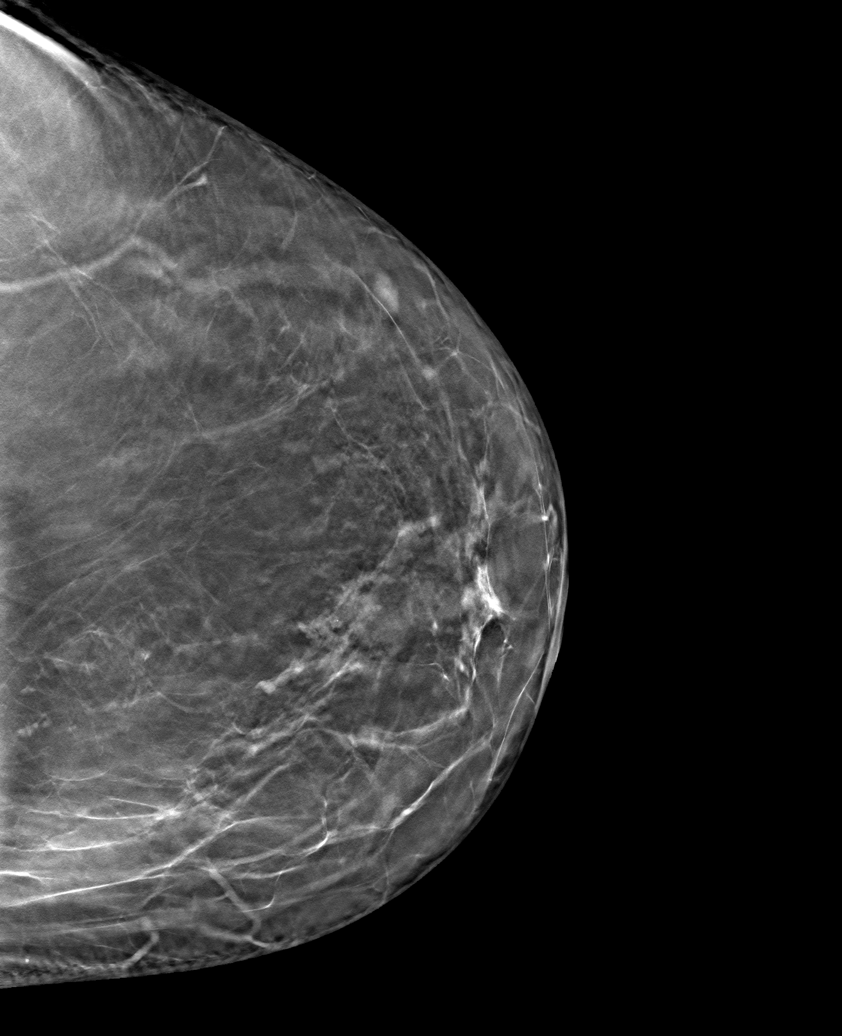

[6 of 30 positions shown; findings below may reference images not displayed]

ACR Breast Density Category b: There are scattered areas of
fibroglandular density.
FINDINGS: There are no findings suspicious for malignancy.
IMPRESSION: No mammographic evidence of malignancy. A result letter of this
screening mammogram will be mailed directly to the patient.

RECOMMENDATION:
Screening mammogram in one year. (Code:51-O-LD2)

BI-RADS CATEGORY  1: Negative.

## 2022-12-07 ENCOUNTER — Other Ambulatory Visit: Payer: Self-pay | Admitting: Internal Medicine

## 2022-12-07 DIAGNOSIS — F321 Major depressive disorder, single episode, moderate: Secondary | ICD-10-CM

## 2022-12-07 DIAGNOSIS — F419 Anxiety disorder, unspecified: Secondary | ICD-10-CM

## 2022-12-10 ENCOUNTER — Other Ambulatory Visit: Payer: Self-pay | Admitting: Family Medicine

## 2022-12-29 ENCOUNTER — Other Ambulatory Visit: Payer: Self-pay | Admitting: Internal Medicine

## 2022-12-29 DIAGNOSIS — E785 Hyperlipidemia, unspecified: Secondary | ICD-10-CM

## 2022-12-29 DIAGNOSIS — I10 Essential (primary) hypertension: Secondary | ICD-10-CM

## 2023-01-02 ENCOUNTER — Other Ambulatory Visit: Payer: Self-pay | Admitting: Hematology

## 2023-01-05 ENCOUNTER — Other Ambulatory Visit: Payer: Self-pay | Admitting: Internal Medicine

## 2023-01-05 ENCOUNTER — Encounter: Payer: Self-pay | Admitting: Hematology

## 2023-01-05 DIAGNOSIS — F419 Anxiety disorder, unspecified: Secondary | ICD-10-CM

## 2023-01-05 DIAGNOSIS — F321 Major depressive disorder, single episode, moderate: Secondary | ICD-10-CM

## 2023-01-06 ENCOUNTER — Inpatient Hospital Stay: Payer: 59 | Attending: Hematology

## 2023-01-06 DIAGNOSIS — C541 Malignant neoplasm of endometrium: Secondary | ICD-10-CM | POA: Insufficient documentation

## 2023-01-06 DIAGNOSIS — C786 Secondary malignant neoplasm of retroperitoneum and peritoneum: Secondary | ICD-10-CM | POA: Insufficient documentation

## 2023-01-06 DIAGNOSIS — Z452 Encounter for adjustment and management of vascular access device: Secondary | ICD-10-CM | POA: Insufficient documentation

## 2023-01-06 MED ORDER — HEPARIN SOD (PORK) LOCK FLUSH 100 UNIT/ML IV SOLN
500.0000 [IU] | Freq: Once | INTRAVENOUS | Status: AC
  Administered 2023-01-06: 500 [IU] via INTRAVENOUS

## 2023-01-06 MED ORDER — SODIUM CHLORIDE 0.9% FLUSH
10.0000 mL | INTRAVENOUS | Status: DC | PRN
  Administered 2023-01-06: 10 mL via INTRAVENOUS

## 2023-01-06 NOTE — Progress Notes (Signed)
Georga Hacking Belloso presented for Portacath access and flush.  Portacath located right chest wall accessed with  H 20 needle.  Good blood return present. Portacath flushed with 20ml NS and 500U/54ml Heparin and needle removed intact. No bruising or swelling noted at the site. Procedure tolerated well and without incident.     Discharged from clinic ambulatory in stable condition. Alert and oriented x 3. F/U with [facility] as scheduled.

## 2023-01-08 ENCOUNTER — Encounter: Payer: Self-pay | Admitting: Internal Medicine

## 2023-01-08 ENCOUNTER — Ambulatory Visit: Payer: 59 | Admitting: Internal Medicine

## 2023-01-08 VITALS — BP 122/84 | HR 94 | Ht 64.0 in | Wt 223.0 lb

## 2023-01-08 DIAGNOSIS — H8113 Benign paroxysmal vertigo, bilateral: Secondary | ICD-10-CM | POA: Diagnosis not present

## 2023-01-08 DIAGNOSIS — I10 Essential (primary) hypertension: Secondary | ICD-10-CM

## 2023-01-08 DIAGNOSIS — H8110 Benign paroxysmal vertigo, unspecified ear: Secondary | ICD-10-CM | POA: Insufficient documentation

## 2023-01-08 DIAGNOSIS — F321 Major depressive disorder, single episode, moderate: Secondary | ICD-10-CM

## 2023-01-08 DIAGNOSIS — R7303 Prediabetes: Secondary | ICD-10-CM

## 2023-01-08 DIAGNOSIS — E785 Hyperlipidemia, unspecified: Secondary | ICD-10-CM | POA: Diagnosis not present

## 2023-01-08 DIAGNOSIS — Z23 Encounter for immunization: Secondary | ICD-10-CM | POA: Diagnosis not present

## 2023-01-08 MED ORDER — MECLIZINE HCL 25 MG PO TABS: 25.0000 mg | ORAL_TABLET | Freq: Three times a day (TID) | ORAL | 3 refills | Status: AC | PRN

## 2023-01-08 NOTE — Assessment & Plan Note (Signed)
Lipid panel updated in June.  Total cholesterol 213 and LDL 92.  She is currently prescribed atorvastatin 20 mg daily.  No medication changes are indicated today.

## 2023-01-08 NOTE — Assessment & Plan Note (Signed)
 Remains adequately controlled on current antihypertensive regimen.  No medication changes are indicated today.

## 2023-01-08 NOTE — Assessment & Plan Note (Signed)
Her acute concern today is vertigo.  She endorses intermittent dizziness.  Urgent care presentation in July endorsing similar symptoms.  She was prescribed meclizine, which was effective.  Refill requested today as she is currently out of medication. -Meclizine refilled

## 2023-01-08 NOTE — Assessment & Plan Note (Signed)
A1c 6.3 on labs from June.  She has made significant lifestyle changes since undergoing hip replacement surgery.  She is exercising regularly and has made dietary changes.  Repeat A1c at follow-up in 6 months.

## 2023-01-08 NOTE — Assessment & Plan Note (Signed)
-   Tdap vaccine administered today.

## 2023-01-08 NOTE — Assessment & Plan Note (Signed)
Mood remains stable with sertraline 100 mg daily.  No medication changes are indicated today.

## 2023-01-08 NOTE — Progress Notes (Signed)
Established Patient Office Visit  Subjective   Patient ID: Jessica [name], female    DOB: [birth year]  Age: 62 y.o. MRN: [id number]  Chief Complaint  Patient presents with   Follow-up    Patient complains of bad vertigo that is effecting her eyesight and making her dizzy 90% of the time.    Jessica [name] returns to care today for routine follow-up.  She was last evaluated by me on 6/19.  Her acute concern at that time was right hip pain.  X-rays were ordered, revealing osteoarthritis.  She was referred to orthopedic surgery and ultimately underwent right hip arthroplasty on 8/30.  Her postoperative course has been uncomplicated.  She was seen by oncology for follow-up on 9/17.  There have otherwise been no acute interval events. Jessica [name] reports feeling fairly well today.  Her acute concern is vertigo.  She presented to urgent care in July for dizziness and was diagnosed with vertigo.  Symptoms improved with meclizine.  She is currently on medication and requests a refill.  She does not have any additional concerns to discuss.  Past Medical History:  Diagnosis Date   Allergy    Anemia    Anxiety    Arthritis    Cancer (HCC)    [employer]reesia 09/05/2019   Cancer of skin of back    Chest pain    Depression    Elevated blood pressure reading    Endometrial cancer (HCC)    Hyperlipidemia    Hypertension    PMB (postmenopausal bleeding)    Port-A-Cath in place 12/24/2018   Pre-diabetes    Past Surgical History:  Procedure Laterality Date   ABDOMINAL HYSTERECTOMY N/A    [employer] 09/05/2019   IR IMAGING GUIDED PORT INSERTION  12/28/2018   IR RADIOLOGIST EVAL & MGMT  06/02/2018   IR RADIOLOGIST EVAL & MGMT  06/16/2018   NECK SURGERY  2000   spinal surgery , titanium rod in place    ROBOTIC ASSISTED TOTAL HYSTERECTOMY WITH BILATERAL SALPINGO OOPHERECTOMY N/A 04/13/2018   Procedure: XI ROBOTIC ASSISTED TOTAL HYSTERECTOMY WITH BILATERAL SALPINGO OOPHORECTOMY;  Surgeon: Adolphus Birchwood, MD;   Location: [facility];  Service: Gynecology;  Laterality: N/A;   ROBOTIC PELVIC AND PARA-AORTIC LYMPH NODE DISSECTION N/A 04/13/2018   Procedure: XI ROBOTIC PELVIC LYMPHADECTOMY AND PARA-AORTIC LYMPH NODE DISSECTION;  Surgeon: Adolphus Birchwood, MD;  Location: [facility];  Service: Gynecology;  Laterality: N/A;   TOTAL HIP ARTHROPLASTY Right 09/19/2022   Procedure: RIGHT TOTAL HIP ARTHROPLASTY ANTERIOR APPROACH;  Surgeon: Kathryne Hitch, MD;  Location: [facility];  Service: Orthopedics;  Laterality: Right;   Social History   Tobacco Use   Smoking status: Never   Smokeless tobacco: Never  Vaping Use   Vaping status: Never Used  Substance Use Topics   Alcohol use: Yes    Alcohol/week: 4.0 standard drinks of alcohol    Types: 4 Glasses of wine per week    Comment: weekends   Drug use: Never   Family History  Problem Relation Age of Onset   Hypertension Mother    Breast cancer Mother    Hypertension Father    Heart disease Father    Cancer Father        lung   Cancer Sister        ovarian cancer   Melanoma Sister    Allergies  Allergen Reactions   Carboplatin Other (See Comments)    "body on fire and abdominal pain"   Nickel Rash  itchy   Review of Systems  Constitutional:  Negative for chills and fever.  HENT:  Negative for sore throat.   Respiratory:  Negative for cough and shortness of breath.   Cardiovascular:  Negative for chest pain, palpitations and leg swelling.  Gastrointestinal:  Negative for abdominal pain, blood in stool, constipation, diarrhea, nausea and vomiting.  Genitourinary:  Negative for dysuria and hematuria.  Musculoskeletal:  Negative for myalgias.  Skin:  Negative for itching and rash.  Neurological:  Positive for dizziness (Intermittent). Negative for headaches.  Psychiatric/Behavioral:  Negative for depression and suicidal ideas.      Objective:     BP 122/84   Pulse 94   Ht 5\' 4"  (1.626 m)   Wt 223 lb (101.2 kg)   SpO2 96%   BMI 38.28 kg/m   BP Readings from Last 3 Encounters:  01/08/23 122/84  01/06/23 (!) 159/79  10/07/22 122/82   Physical Exam Vitals reviewed.  Constitutional:      General: She is not in acute distress.    Appearance: Normal appearance. She is obese. She is not toxic-appearing.  HENT:     Head: Normocephalic and atraumatic.     Right Ear: External ear normal.     Left Ear: External ear normal.     Nose: Nose normal. No congestion or rhinorrhea.     Mouth/Throat:     Mouth: Mucous membranes are moist.     Pharynx: Oropharynx is clear. No oropharyngeal exudate or posterior oropharyngeal erythema.  Eyes:     General: No scleral icterus.    Extraocular Movements: Extraocular movements intact.     Conjunctiva/sclera: Conjunctivae normal.     Pupils: Pupils are equal, round, and reactive to light.  Cardiovascular:     Rate and Rhythm: Normal rate and regular rhythm.     Pulses: Normal pulses.     Heart sounds: Normal heart sounds. No murmur heard.    No friction rub. No gallop.  Pulmonary:     Effort: Pulmonary effort is normal.     Breath sounds: Normal breath sounds. No wheezing, rhonchi or rales.  Abdominal:     General: Abdomen is flat. Bowel sounds are normal. There is no distension.     Palpations: Abdomen is soft.     Tenderness: There is no abdominal tenderness.  Musculoskeletal:        General: No swelling. Normal range of motion.     Cervical back: Normal range of motion.     Right lower leg: No edema.     Left lower leg: No edema.  Lymphadenopathy:     Cervical: No cervical adenopathy.  Skin:    General: Skin is warm and dry.     Capillary Refill: Capillary refill takes less than 2 seconds.     Coloration: Skin is not jaundiced.  Neurological:     General: No focal deficit present.     Mental Status: She is alert and oriented to person, place, and time.  Psychiatric:        Mood and Affect: Mood normal.        Behavior: Behavior normal.   Last CBC Lab Results  Component  Value Date   WBC 9.5 09/30/2022   HGB 10.5 (L) 09/30/2022   HCT 32.6 (L) 09/30/2022   MCV 84.5 09/30/2022   MCH 27.2 09/30/2022   RDW 15.7 (H) 09/30/2022   PLT 494 (H) 09/30/2022   Last metabolic panel Lab Results  Component Value Date   GLUCOSE  125 (H) 09/30/2022   NA 135 09/30/2022   K 3.5 09/30/2022   CL 103 09/30/2022   CO2 22 09/30/2022   BUN 13 09/30/2022   CREATININE 0.62 09/30/2022   GFRNONAA >60 09/30/2022   CALCIUM 8.6 (L) 09/30/2022   PROT 7.0 09/30/2022   ALBUMIN 3.4 (L) 09/30/2022   LABGLOB 2.5 08/07/2022   AGRATIO 1.9 04/26/2018   BILITOT 0.7 09/30/2022   ALKPHOS 107 09/30/2022   AST 15 09/30/2022   ALT 20 09/30/2022   ANIONGAP 10 09/30/2022   Last lipids Lab Results  Component Value Date   CHOL 213 (H) 07/09/2022   HDL 107 07/09/2022   LDLCALC 92 07/09/2022   TRIG 78 07/09/2022   CHOLHDL 2.0 07/09/2022   Last hemoglobin A1c Lab Results  Component Value Date   HGBA1C 6.3 (H) 07/09/2022   Last thyroid functions Lab Results  Component Value Date   TSH 0.907 07/09/2022   T4TOTAL 7.9 04/26/2018   Last vitamin D Lab Results  Component Value Date   VD25OH 38.5 07/09/2022   Last vitamin B12 and Folate Lab Results  Component Value Date   VITAMINB12 632 07/09/2022   FOLATE 9.3 07/09/2022     Assessment & Plan:   Problem List Items Addressed This Visit       Essential hypertension (Chronic)   Remains adequately controlled on current antihypertensive regimen.  No medication changes are indicated today.      BPPV (benign paroxysmal positional vertigo)   Her acute concern today is vertigo.  She endorses intermittent dizziness.  Urgent care presentation in July endorsing similar symptoms.  She was prescribed meclizine, which was effective.  Refill requested today as she is currently out of medication. -Meclizine refilled      Hyperlipidemia (Chronic)   Lipid panel updated in June.  Total cholesterol 213 and LDL 92.  She is currently  prescribed atorvastatin 20 mg daily.  No medication changes are indicated today.      Depression, major, single episode, moderate (HCC)   Mood remains stable with sertraline 100 mg daily.  No medication changes are indicated today.      Prediabetes   A1c 6.3 on labs from June.  She has made significant lifestyle changes since undergoing hip replacement surgery.  She is exercising regularly and has made dietary changes.  Repeat A1c at follow-up in 6 months.      Need for Tdap vaccination - Primary   Tdap vaccine administered today       Return in about 6 months (around 07/09/2023).    Billie Lade, MD

## 2023-01-08 NOTE — Patient Instructions (Signed)
It was a pleasure to see you today.  Thank you for giving [place] the opportunity to be involved in your care.  Below is a brief recap of your visit and next steps.  We will plan to see you again in 6 months.  Summary No medication changes today Meclizine refilled See attached information regarding vertigo Follow up in 6 months

## 2023-01-09 ENCOUNTER — Other Ambulatory Visit: Payer: Self-pay

## 2023-02-02 ENCOUNTER — Ambulatory Visit (INDEPENDENT_AMBULATORY_CARE_PROVIDER_SITE_OTHER): Payer: 59 | Admitting: Orthopaedic Surgery

## 2023-02-02 ENCOUNTER — Other Ambulatory Visit (INDEPENDENT_AMBULATORY_CARE_PROVIDER_SITE_OTHER): Payer: 59

## 2023-02-02 ENCOUNTER — Encounter: Payer: Self-pay | Admitting: Hematology

## 2023-02-02 ENCOUNTER — Encounter: Payer: Self-pay | Admitting: Orthopaedic Surgery

## 2023-02-02 DIAGNOSIS — Z96641 Presence of right artificial hip joint: Secondary | ICD-10-CM

## 2023-02-02 NOTE — Progress Notes (Signed)
 The patient is now 5 months status post a right total hip arthroplasty to treat severe right hip arthritis.  She is only 63 years old.  She reports that she is doing well and does still have some numbness but she reports good range of motion and strength.  She is able to participate in her ADLs much easier as well.  Her right operative hip moves smoothly and fluidly with no blocks to rotation.  Her left hip also moves smoothly.  We did review her previous films prior to surgery to show her the extent of her right hip arthritis.  Her x-rays today of her pelvis and right hip show well-seated right total hip arthroplasty with no complicating features.  The left hip joint space is well-maintained.  On my standpoint she will continue to increase her activities as comfort allows with no restrictions.  We will see her back in 6 months with a final standing AP pelvis.

## 2023-02-03 ENCOUNTER — Other Ambulatory Visit: Payer: Self-pay

## 2023-02-03 ENCOUNTER — Other Ambulatory Visit: Payer: Self-pay | Admitting: Internal Medicine

## 2023-02-03 DIAGNOSIS — F419 Anxiety disorder, unspecified: Secondary | ICD-10-CM

## 2023-02-03 DIAGNOSIS — F321 Major depressive disorder, single episode, moderate: Secondary | ICD-10-CM

## 2023-02-17 ENCOUNTER — Encounter: Payer: Self-pay | Admitting: Hematology

## 2023-03-05 ENCOUNTER — Other Ambulatory Visit: Payer: Self-pay | Admitting: Internal Medicine

## 2023-03-05 DIAGNOSIS — F321 Major depressive disorder, single episode, moderate: Secondary | ICD-10-CM

## 2023-03-05 DIAGNOSIS — F419 Anxiety disorder, unspecified: Secondary | ICD-10-CM

## 2023-03-22 ENCOUNTER — Other Ambulatory Visit: Payer: Self-pay | Admitting: Hematology

## 2023-03-23 ENCOUNTER — Encounter: Payer: Self-pay | Admitting: Hematology

## 2023-03-30 ENCOUNTER — Encounter: Payer: Self-pay | Admitting: Hematology

## 2023-04-01 ENCOUNTER — Other Ambulatory Visit: Payer: Self-pay | Admitting: Internal Medicine

## 2023-04-01 DIAGNOSIS — I10 Essential (primary) hypertension: Secondary | ICD-10-CM

## 2023-04-01 DIAGNOSIS — E785 Hyperlipidemia, unspecified: Secondary | ICD-10-CM

## 2023-04-04 ENCOUNTER — Other Ambulatory Visit: Payer: Self-pay | Admitting: Internal Medicine

## 2023-04-04 DIAGNOSIS — F419 Anxiety disorder, unspecified: Secondary | ICD-10-CM

## 2023-04-04 DIAGNOSIS — F321 Major depressive disorder, single episode, moderate: Secondary | ICD-10-CM

## 2023-04-06 ENCOUNTER — Ambulatory Visit (HOSPITAL_COMMUNITY)
Admission: RE | Admit: 2023-04-06 | Discharge: 2023-04-06 | Disposition: A | Discharge status: Home / Self Care | Payer: 59 | Source: Ambulatory Visit | Attending: Hematology | Admitting: Hematology

## 2023-04-06 ENCOUNTER — Inpatient Hospital Stay: Payer: 59 | Attending: Hematology

## 2023-04-06 VITALS — BP 153/78 | HR 69 | Temp 98.1°F | Resp 18

## 2023-04-06 DIAGNOSIS — G629 Polyneuropathy, unspecified: Secondary | ICD-10-CM | POA: Insufficient documentation

## 2023-04-06 DIAGNOSIS — C786 Secondary malignant neoplasm of retroperitoneum and peritoneum: Secondary | ICD-10-CM | POA: Insufficient documentation

## 2023-04-06 DIAGNOSIS — C541 Malignant neoplasm of endometrium: Secondary | ICD-10-CM | POA: Insufficient documentation

## 2023-04-06 DIAGNOSIS — Z452 Encounter for adjustment and management of vascular access device: Secondary | ICD-10-CM | POA: Diagnosis present

## 2023-04-06 DIAGNOSIS — Z90710 Acquired absence of both cervix and uterus: Secondary | ICD-10-CM | POA: Insufficient documentation

## 2023-04-06 DIAGNOSIS — I10 Essential (primary) hypertension: Secondary | ICD-10-CM | POA: Insufficient documentation

## 2023-04-06 DIAGNOSIS — R928 Other abnormal and inconclusive findings on diagnostic imaging of breast: Secondary | ICD-10-CM | POA: Insufficient documentation

## 2023-04-06 DIAGNOSIS — Z79899 Other long term (current) drug therapy: Secondary | ICD-10-CM | POA: Insufficient documentation

## 2023-04-06 DIAGNOSIS — Z90722 Acquired absence of ovaries, bilateral: Secondary | ICD-10-CM | POA: Diagnosis not present

## 2023-04-06 DIAGNOSIS — Z9079 Acquired absence of other genital organ(s): Secondary | ICD-10-CM | POA: Diagnosis not present

## 2023-04-06 LAB — COMPREHENSIVE METABOLIC PANEL
ALT: 17 U/L (ref 0–44)
AST: 20 U/L (ref 15–41)
Albumin: 3.6 g/dL (ref 3.5–5.0)
Alkaline Phosphatase: 105 U/L (ref 38–126)
Anion gap: 11 (ref 5–15)
BUN: 17 mg/dL (ref 6–20)
CO2: 24 mmol/L (ref 22–32)
Calcium: 9 mg/dL (ref 8.9–10.3)
Chloride: 104 mmol/L (ref 98–111)
Creatinine, Ser: 0.64 mg/dL (ref 0.44–1.00)
GFR, Estimated: >60 mL/min (ref >60)
Glucose, Bld: 114 mg/dL — ABNORMAL HIGH (ref 70–99)
Potassium: 3.8 mmol/L (ref 3.5–5.1)
Sodium: 139 mmol/L (ref 135–145)
Total Bilirubin: 0.3 mg/dL (ref 0.0–1.2)
Total Protein: 6.4 g/dL — ABNORMAL LOW (ref 6.5–8.1)

## 2023-04-06 LAB — CBC WITH DIFFERENTIAL/PLATELET
Abs Immature Granulocytes: 0.02 10*3/uL (ref 0.00–0.07)
Basophils Absolute: 0 10*3/uL (ref 0.0–0.1)
Basophils Relative: 1 %
Eosinophils Absolute: 0.2 10*3/uL (ref 0.0–0.5)
Eosinophils Relative: 3 %
HCT: 37.1 % (ref 36.0–46.0)
Hemoglobin: 11.9 g/dL — ABNORMAL LOW (ref 12.0–15.0)
Immature Granulocytes: 0 %
Lymphocytes Relative: 36 %
Lymphs Abs: 2.4 10*3/uL (ref 0.7–4.0)
MCH: 25.8 pg — ABNORMAL LOW (ref 26.0–34.0)
MCHC: 32.1 g/dL (ref 30.0–36.0)
MCV: 80.3 fL (ref 80.0–100.0)
Monocytes Absolute: 0.6 10*3/uL (ref 0.1–1.0)
Monocytes Relative: 9 %
Neutro Abs: 3.4 10*3/uL (ref 1.7–7.7)
Neutrophils Relative %: 51 %
Platelets: 304 10*3/uL (ref 150–400)
RBC: 4.62 MIL/uL (ref 3.87–5.11)
RDW: 19.5 % — ABNORMAL HIGH (ref 11.5–15.5)
WBC: 6.6 10*3/uL (ref 4.0–10.5)
nRBC: 0 % (ref 0.0–0.2)

## 2023-04-06 LAB — MAGNESIUM: Magnesium: 1.9 mg/dL (ref 1.7–2.4)

## 2023-04-06 MED ORDER — HEPARIN SOD (PORK) LOCK FLUSH 100 UNIT/ML IV SOLN
500.0000 [IU] | Freq: Once | INTRAVENOUS | Status: AC
  Administered 2023-04-06: 500 [IU] via INTRAVENOUS

## 2023-04-06 MED ORDER — IOHEXOL 300 MG/ML  SOLN
100.0000 mL | Freq: Once | INTRAMUSCULAR | Status: AC | PRN
  Administered 2023-04-06: 100 mL via INTRAVENOUS

## 2023-04-06 MED ORDER — SODIUM CHLORIDE 0.9% FLUSH
10.0000 mL | INTRAVENOUS | Status: DC | PRN
  Administered 2023-04-06: 10 mL via INTRAVENOUS

## 2023-04-06 NOTE — Progress Notes (Signed)
 Patient presents today for port flush with labs per provider's order. Vital signs stable and patient voiced no new complaints at this time. Patient remains accessed for CT scan.   Discharged from clinic ambulatory in stable condition. Alert and oriented x 3. F/U with [facility] as scheduled.

## 2023-04-07 LAB — CA 125: Cancer Antigen (CA) 125: 6.3 U/mL (ref 0.0–38.1)

## 2023-04-13 ENCOUNTER — Inpatient Hospital Stay (HOSPITAL_BASED_OUTPATIENT_CLINIC_OR_DEPARTMENT_OTHER): Payer: 59 | Admitting: Hematology

## 2023-04-13 ENCOUNTER — Inpatient Hospital Stay

## 2023-04-13 VITALS — BP 125/75 | HR 64 | Temp 98.1°F | Resp 16 | Wt 229.1 lb

## 2023-04-13 DIAGNOSIS — C541 Malignant neoplasm of endometrium: Secondary | ICD-10-CM

## 2023-04-13 DIAGNOSIS — Z452 Encounter for adjustment and management of vascular access device: Secondary | ICD-10-CM | POA: Diagnosis not present

## 2023-04-13 NOTE — Progress Notes (Unsigned)
 Roanna Banning, MD  Samule Ohm, MD PROCEDURE / BIOPSY REVIEW Date: 04/13/23  Requested Biopsy site: Deep pelvis. Vaginal cuff. Reason for request: 2 cm ST thickening on CT Imaging review: I reviewed all pertinent diagnostic studies, including; CT AP  Decision: Declined / Defer  Additional comments: Deep pelvis, small lesion not amenable percutaneously. Consider PET CT if surrogate lesion is warranted vs follow up CT for re-evaluation.  Please contact me with questions, concerns, or if issue pertaining to this request arise.  Roanna Banning, MD [facility]       Previous Messages    ----- Message ----- From: Alain Marion Sent: 04/13/2023   1:05 PM EDT To: Alain Marion; Ir Procedure Requests  Procedure :[place] FNA SOFT TISSUE  Reason :bx 2.2 cm soft tissue density along the superior and right lateral margin of the vaginal cuff; hx ovarian cancer Dx: Endometrial cance  History :CT ABDOMEN PELVIS W CONTRAST,XR HIP UNILAT W OR W/O PELVIS 1V RIGHT  Provider:Katragadda, Vern Claude, MD  Provider contact:[phone]

## 2023-04-13 NOTE — Patient Instructions (Signed)
 [facility] at [facility] Discharge Instructions   You were seen and examined today by Dr. Ellin Saba.  He reviewed the results of your lab work which are normal/stable.   He reviewed the results of your CT scan. It is showing the cancer is coming back in the pelvis.   We will try to get another biopsy of this new recurrence as your cancer may have changed from previously.   He discussed with you restarting treatment. We will do a special blood test today (Guardant 360) to see if there are any mutations we can target with specific therapies.   Return as scheduled.    Thank you for choosing [facility] at [facility] to provide your oncology and hematology care.  To afford each patient quality time with our provider, please arrive at least 15 minutes before your scheduled appointment time.   If you have a lab appointment with the Cancer Center please come in thru the Main Entrance and check in at the main information desk.  You need to re-schedule your appointment should you arrive 10 or more minutes late.  We strive to give you quality time with our providers, and arriving late affects you and other patients whose appointments are after yours.  Also, if you no show three or more times for appointments you may be dismissed from the clinic at the providers discretion.     Again, thank you for choosing [facility].  Our hope is that these requests will decrease the amount of time that you wait before being seen by our physicians.       _____________________________________________________________  Should you have questions after your visit to [facility], please contact our office at [phone] and follow the prompts.  Our office hours are 8:00 a.m. and 4:30 p.m. Monday - Friday.  Please note that voicemails left after 4:00 p.m. may not be returned until the following business day.  We are closed weekends and major  holidays.  You do have access to a nurse 24-7, just call the main number to the clinic [phone] and do not press any options, hold on the line and a nurse will answer the phone.    For prescription refill requests, have your pharmacy contact our office and allow 72 hours.    Due to Covid, you will need to wear a mask upon entering the hospital. If you do not have a mask, a mask will be given to you at the Main Entrance upon arrival. For doctor visits, patients may have 1 support person age 40 or older with them. For treatment visits, patients can not have anyone with them due to social distancing guidelines and our immunocompromised population.

## 2023-04-13 NOTE — Progress Notes (Signed)
 Jessica [name] [address] [place] [address]    Clinic Day:  04/13/2023  Referring physician: Billie Lade, MD  Patient Care Team: Jessica Lade, MD as PCP - General (Internal Medicine) Jessica [name], Jessica Modest, MD as PCP - Cardiology (Cardiology) Jessica Bail, RN as Oncology Nurse Navigator Jessica Massed, MD as Medical Oncologist (Oncology)   ASSESSMENT & PLAN:   Assessment: 1.  Recurrent endometrial carcinosarcoma: -TAH, BSO, bilateral pelvic and para-aortic lymph node resections on 04/13/2018. -Pathology showed carcinosarcoma (malignant mixed mullerian tumor) is arising in an endometrial type polyp with no myometrial invasion identified.  High-grade.  0/39 lymph nodes positive.  PT1APN0, FIGO stage Ia.  MMR normal.  MSI-stable. -CTAP on 12/09/2018 for abdominal pain showed extensive peritoneal carcinomatosis and large soft tissue mass in the pelvis. -Biopsy of the omental mass on 12/28/2018 shows poorly differentiated carcinoma consistent with her prior malignancy. -Carboplatin and paclitaxel started on 12/29/2018. -CT scan on 05/02/2019 showed peritoneal implants in the low central small bowel mesentery and left pelvic sidewall have decreased in size measuring 1.5 x 1.9 cm and 1.7 x 2.2 cm.  Soft tissue nodule in the left lateral omentum measures 1 x 1.7 cm with no evidence of metastatic disease. -Continuation of chemotherapy until complete response was recommended. -CT AP on 07/04/2019 showed continued positive response to therapy with scattered peritoneal metastasis in the pelvis and left omentum decreased in size.  No new metastatic disease. -CTAP on 10/24/2019 after 14 cycles showed substantial reduction in size of soft tissue nodules in the pelvis.  1.4 x 0.9 x 1 cm soft tissue density in the inferior serosal margin of the sigmoid colon, previously 1.7 x 1.1 x 1.7 cm.  2 small soft tissue nodules in the mesenteric adipose tissue above the urinary  bladder measures 0.8 cm. -She has developed serious allergic reaction during cycle 14 with carboplatin. -I have discussed with Jessica [name].  Other options include adding ifosfamide to Taxol or Doxil and combination of lenvatinib with pembrolizumab.  In the lenvatinib pembrolizumab trial carcinosarcomas were not included. -Single agent paclitaxel started on 11/09/2019, dose reduced by 20% on 01/18/2020.  Paclitaxel held on 08/22/2021 as a chemo holiday. -CTAP on 01/10/2020 with stable small peritoneal soft tissue nodules in the pelvis.  Resolution of soft tissue nodularity in the left abdominal omental fat with no new or progressive disease.    Plan: 1.  Recurrent endometrial carcinosarcoma: - Denies any abdominal pains.  However she developed a spasms on the sides of the abdomen for the last 2 to 3 months on and off.  Initially it was happening few times a week and last about 2 minutes.  She had the spasms when she was exercising in the gym.  She reports spasms are getting better. - Reviewed labs today: Normal LFTs and creatinine.  CBC was normal.  CA125 is 6.3. - CTAP on 04/06/2023: New 2.2 cm soft tissue density along the superior and right lateral margin of the vaginal cuff suspicious for local recurrence.  Increased size of 11 mm lymph node, 7 mm previously. - I have recommended biopsy of the soft tissue density or lymph node.  Recommend NGS testing on the pathology.  Will also send Guardant360 today. - Treatment options include chemotherapy with paclitaxel/Doxil.  On chemotherapy options include lenvatinib/pembrolizumab/Enhertu/NTRK inhibitors based on NGS results.   2.  Low back pain/right-sided abdominal pain: - Right THR on 09/19/2022.  Continue oxycodone as needed.   3.  Hypertension: -  Continue Norvasc 5 mg daily.  Blood pressure is 125/75.   4.  Neuropathy in the legs: - Continue Lyrica 75 mg twice daily.  Neuropathy stable.   5.  Abnormal mammogram: - She has not had a mammogram since  2023.  Will order screening mammogram.  6.  Hypomagnesemia: - Continue magnesium 1 tablet daily.  Magnesium is normal.    Orders Placed This Encounter  Procedures   CT US GUIDED BIOPSY    Standing Status:   Future    Expected Date:   04/20/2023    Expiration Date:   04/12/2024    Lab orders requested (DO NOT place separate lab orders, these will be automatically ordered during procedure specimen collection)::   Surgical Pathology    Reason for Exam (SYMPTOM  OR DIAGNOSIS REQUIRED):   bx 2.2 cm soft tissue density along the superior and right lateral margin of the vaginal cuff; hx ovarian cancer    Is patient pregnant?:   No    Preferred location?:   [facility]      I,Jessica [name],acting as a scribe for Jessica Massed, MD.,have documented all relevant documentation on the behalf of Jessica Massed, MD,as directed by  Jessica Massed, MD while in the presence of Jessica Massed, MD.   I, Jessica Massed MD, have reviewed the above documentation for accuracy and completeness, and I agree with the above.   Jessica Massed, MD   3/24/202512:52 PM  CHIEF COMPLAINT:   Diagnosis: recurrent endometrial cancer    Cancer Staging  No matching staging information was found for the patient.    Prior Therapy: 1. TAH & BSO, LNDs on 04/13/2018 2. Carboplatin/Paclitaxel, 12/29/18 - 10/05/19 3. Maintenance Paclitaxel, last dose on 08/22/2021  Current Therapy:  surveillance    HISTORY OF PRESENT ILLNESS:   Oncology History  Endometrial cancer (HCC)  03/31/2018 Initial Diagnosis   Endometrial cancer (HCC)   12/29/2018 -  Chemotherapy   Patient is on Treatment Plan : UTERINE Carboplatin AUC 6 / Paclitaxel q21d     06/06/2020 Miscellaneous   Summary of oncologic history from Dr. Ellin [name]   1.  Recurrent endometrial carcinosarcoma: -TAH, BSO, bilateral pelvic and para-aortic lymph node resections on 04/13/2018. -Pathology showed carcinosarcoma  (malignant mixed mullerian tumor) is arising in an endometrial type polyp with no myometrial invasion identified.  High-grade.  0/39 lymph nodes positive.  PT1APN0, FIGO stage Ia.  MMR normal.  MSI-stable. -CTAP on 12/09/2018 for abdominal pain showed extensive peritoneal carcinomatosis and large soft tissue mass in the pelvis. -Biopsy of the omental mass on 12/28/2018 shows poorly differentiated carcinoma consistent with her prior malignancy. -Carboplatin and paclitaxel started on 12/29/2018. -CT scan on 05/02/2019 showed peritoneal implants in the low central small bowel mesentery and left pelvic sidewall have decreased in size measuring 1.5 x 1.9 cm and 1.7 x 2.2 cm.  Soft tissue nodule in the left lateral omentum measures 1 x 1.7 cm with no evidence of metastatic disease. -Continuation of chemotherapy until complete response was recommended. -CT AP on 07/04/2019 showed continued positive response to therapy with scattered peritoneal metastasis in the pelvis and left omentum decreased in size.  No new metastatic disease. -CTAP on 10/24/2019 after 14 cycles showed substantial reduction in size of soft tissue nodules in the pelvis.  1.4 x 0.9 x 1 cm soft tissue density in the inferior serosal margin of the sigmoid colon, previously 1.7 x 1.1 x 1.7 cm.  2 small soft tissue nodules in the mesenteric adipose tissue above  the urinary bladder measures 0.8 cm. -She has developed serious allergic reaction during cycle 14 with carboplatin. -I have discussed with Jessica [name].  Other options include adding ifosfamide to Taxol or Doxil and combination of lenvatinib with pembrolizumab.  In the lenvatinib pembrolizumab trial carcinosarcomas were not included. -Single agent paclitaxel started on 11/09/2019, dose reduced by 20% on 01/18/2020. -CTAP on 01/10/2020 with stable small peritoneal soft tissue nodules in the pelvis.  Resolution of soft tissue nodularity in the left abdominal omental fat with no new or progressive  disease.     06/20/2020 Imaging   1. Stable examination status post hysterectomy without suspicious enhancing soft tissue nodularity at the vaginal cuff and no significant change in the multiple small soft tissue nodules in the pelvis likely representing treated tumor. No new or progressive findings. 2. Cholelithiasis including a large gallstone in the neck of the gallbladder but without CT evidence of acute cholecystitis. 3. Small hiatal hernia. 4. Aortic atherosclerosis.        INTERVAL HISTORY:   Jessica [name] is a 63 y.o. female presenting to clinic today for follow up of recurrent endometrial cancer. She was last seen by me on 10/07/22.  Since her last visit, she underwent surveillance CT A/P on 04/06/23 showing: new 2.2 m soft tissue density along superior and right lateral margin of vaginal cuff, suspicious for local recurrence; increased size of 11 mm lymph node or peritoneal nodule adjacent to left external iliac vessels; stable 3 mm nodule or lymph node in sigmoid mesocolon.  Today, she states that she is doing well overall. Her appetite level is at 100%. Her energy level is at 75%.  PAST MEDICAL HISTORY:   Past Medical History: Past Medical History:  Diagnosis Date   Allergy    Anemia    Anxiety    Arthritis    Cancer (HCC)    Phreesia 09/05/2019   Cancer of skin of back    Chest pain    Depression    Elevated blood pressure reading    Endometrial cancer (HCC)    Hyperlipidemia    Hypertension    PMB (postmenopausal bleeding)    Port-A-Cath in place 12/24/2018   Pre-diabetes     Surgical History: Past Surgical History:  Procedure Laterality Date   ABDOMINAL HYSTERECTOMY N/A    Phreesia 09/05/2019   IR IMAGING GUIDED PORT INSERTION  12/28/2018   IR RADIOLOGIST EVAL & MGMT  06/02/2018   IR RADIOLOGIST EVAL & MGMT  06/16/2018   NECK SURGERY  2000   spinal surgery , titanium rod in place    ROBOTIC ASSISTED TOTAL HYSTERECTOMY WITH BILATERAL SALPINGO OOPHERECTOMY N/A  04/13/2018   Procedure: XI ROBOTIC ASSISTED TOTAL HYSTERECTOMY WITH BILATERAL SALPINGO OOPHORECTOMY;  Surgeon: Adolphus Birchwood, MD;  Location: [facility];  Service: Gynecology;  Laterality: N/A;   ROBOTIC PELVIC AND PARA-AORTIC LYMPH NODE DISSECTION N/A 04/13/2018   Procedure: XI ROBOTIC PELVIC LYMPHADECTOMY AND PARA-AORTIC LYMPH NODE DISSECTION;  Surgeon: Adolphus Birchwood, MD;  Location: [facility];  Service: Gynecology;  Laterality: N/A;   TOTAL HIP ARTHROPLASTY Right 09/19/2022   Procedure: RIGHT TOTAL HIP ARTHROPLASTY ANTERIOR APPROACH;  Surgeon: Kathryne Hitch, MD;  Location: [facility];  Service: Orthopedics;  Laterality: Right;    Social History: Social History   Socioeconomic History   Marital status: Widowed    Spouse name: Not on file   Number of children: 3   Years of education: Not on file   Highest education level: 12th grade  Occupational History  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  Tobacco Use   Smoking status: Never   Smokeless tobacco: Never  Vaping Use   Vaping status: Never Used  Substance and Sexual Activity   Alcohol use: Yes    Alcohol/week: 4.0 standard drinks of alcohol    Types: 4 Glasses of wine per week    Comment: weekends   Drug use: Never   Sexual activity: Not Currently  Other Topics Concern   Not on file  Social History Narrative      Wears seat belt    Does not use phone while driving    Smoke detectors at home   Fire extinguisher-no   No weapons   Social Drivers of Health   Financial Resource Strain: Low Risk  (01/04/2023)   Overall Financial Resource Strain (CARDIA)    Difficulty of Paying Living Expenses: Not very hard  Food Insecurity: No Food Insecurity (01/04/2023)   Hunger Vital Sign    Worried About Running Out of Food in the Last Year: Never true    Ran Out of Food in the Last Year: Never true  Transportation Needs: No Transportation Needs (01/04/2023)   PRAPARE - Administrator, Civil Service (Medical): No    Lack  of Transportation (Non-Medical): No  Physical Activity: Sufficiently Active (01/04/2023)   Exercise Vital Sign    Days of Exercise per Week: 4 days    Minutes of Exercise per Session: 90 min  Stress: Stress Concern Present (01/04/2023)   [employer] of Occupational Health - Occupational Stress Questionnaire    Feeling of Stress : To some extent  Social Connections: Socially Isolated (01/04/2023)   Social Connection and Isolation Panel [NHANES]    Frequency of Communication with Friends and Family: Three times a week    Frequency of Social Gatherings with Friends and Family: Once a week    Attends Religious Services: Never    Database administrator or Organizations: No    Attends Engineer, structural: Not on file    Marital Status: Widowed  Intimate Partner Violence: Not At Risk (09/19/2022)   Humiliation, Afraid, Rape, and Kick questionnaire    Fear of Current or Ex-Partner: No    Emotionally Abused: No    Physically Abused: No    Sexually Abused: No    Family History: Family History  Problem Relation Age of Onset   Hypertension Mother    Breast cancer Mother    Hypertension Father    Heart disease Father    Cancer Father        lung   Cancer Sister        ovarian cancer   Melanoma Sister     Current Medications:  Current Outpatient Medications:    amLODipine (NORVASC) 5 MG tablet, Take 1 tablet by mouth once daily, Disp: 90 tablet, Rfl: 0   atorvastatin (LIPITOR) 20 MG tablet, Take 1 tablet by mouth once daily, Disp: 90 tablet, Rfl: 0   Cholecalciferol (VITAMIN D3) 25 MCG (1000 UT) CAPS, Take 1 capsule (1,000 Units total) by mouth daily., Disp: 60 capsule, Rfl: 2   lidocaine-prilocaine (EMLA) cream, Apply to port site prior to use, Disp: 30 g, Rfl: 0   MAGNESIUM-OXIDE 400 (240 Mg) MG tablet, Take 1 tablet by mouth twice daily, Disp: 60 tablet, Rfl: 0   meclizine (ANTIVERT) 25 MG tablet, Take 1 tablet (25 mg total) by mouth 3 (three) times daily as needed  for dizziness., Disp: 30  tablet, Rfl: 3   methocarbamol (ROBAXIN) 500 MG tablet, Take 1 tablet (500 mg total) by mouth every 6 (six) hours as needed for muscle spasms., Disp: 40 tablet, Rfl: 1   nitroGLYCERIN (NITROSTAT) 0.4 MG SL tablet, Place 1 tablet (0.4 mg total) under the tongue every 5 (five) minutes as needed for chest pain., Disp: 25 tablet, Rfl: 3   PACLITAXEL IV, Inject into the vein every 21 ( twenty-one) days., Disp: , Rfl:    pregabalin (LYRICA) 75 MG capsule, TAKE 1 CAPSULE BY MOUTH THREE TIMES DAILY, Disp: 90 capsule, Rfl: 2   prochlorperazine (COMPAZINE) 10 MG tablet, Take 1 tablet (10 mg total) by mouth every 6 (six) hours as needed (Nausea or vomiting)., Disp: 60 tablet, Rfl: 3   sertraline (ZOLOFT) 100 MG tablet, Take 1 tablet by mouth once daily, Disp: 30 tablet, Rfl: 0 No current facility-administered medications for this visit.  Facility-Administered Medications Ordered in Other Visits:    diphenhydrAMINE (BENADRYL) 50 MG/ML injection, , , ,    palonosetron (ALOXI) 0.25 MG/5ML injection, , , ,    Allergies: Allergies  Allergen Reactions   Carboplatin Other (See Comments)    "body on fire and abdominal pain"   Nickel Rash    itchy    REVIEW OF SYSTEMS:   Review of Systems  Constitutional:  Negative for chills, fatigue and fever.  HENT:   Negative for lump/mass, mouth sores, nosebleeds, sore throat and trouble swallowing.   Eyes:  Negative for eye problems.  Respiratory:  Negative for cough and shortness of breath.   Cardiovascular:  Negative for chest pain, leg swelling and palpitations.  Gastrointestinal:  Positive for nausea. Negative for abdominal pain, constipation, diarrhea and vomiting.  Genitourinary:  Negative for bladder incontinence, difficulty urinating, dysuria, frequency, hematuria and nocturia.   Musculoskeletal:  Negative for arthralgias, back pain, flank pain, myalgias and neck pain.  Skin:  Negative for itching and rash.  Neurological:   Positive for dizziness, headaches and numbness.  Hematological:  Does not bruise/bleed easily.  Psychiatric/Behavioral:  Negative for depression, sleep disturbance and suicidal ideas. The patient is not nervous/anxious.   All other systems reviewed and are negative.    VITALS:   Blood pressure 125/75, pulse 64, temperature 98.1 F (36.7 C), temperature source Oral, resp. rate 16, weight 229 lb 0.9 oz (103.9 kg), SpO2 94%.  Wt Readings from Last 3 Encounters:  04/13/23 229 lb 0.9 oz (103.9 kg)  01/08/23 223 lb (101.2 kg)  10/07/22 220 lb 9.6 oz (100.1 kg)    Body mass index is 39.32 kg/m.  Performance status (ECOG): 1 - Symptomatic but completely ambulatory  PHYSICAL EXAM:   Physical Exam Vitals and nursing note reviewed. Exam conducted with a chaperone present.  Constitutional:      Appearance: Normal appearance.  Cardiovascular:     Rate and Rhythm: Normal rate and regular rhythm.     Pulses: Normal pulses.     Heart sounds: Normal heart sounds.  Pulmonary:     Effort: Pulmonary effort is normal.     Breath sounds: Normal breath sounds.  Abdominal:     Palpations: Abdomen is soft. There is no hepatomegaly, splenomegaly or mass.     Tenderness: There is no abdominal tenderness.  Musculoskeletal:     Right lower leg: No edema.     Left lower leg: No edema.  Lymphadenopathy:     Cervical: No cervical adenopathy.     Right cervical: No superficial, deep or posterior cervical adenopathy.  Left cervical: No superficial, deep or posterior cervical adenopathy.     Upper Body:     Right upper body: No supraclavicular or axillary adenopathy.     Left upper body: No supraclavicular or axillary adenopathy.  Neurological:     General: No focal deficit present.     Mental Status: She is alert and oriented to person, place, and time.  Psychiatric:        Mood and Affect: Mood normal.        Behavior: Behavior normal.     LABS:   CBC     Component Value Date/Time   WBC  6.6 04/06/2023 1018   RBC 4.62 04/06/2023 1018   HGB 11.9 (L) 04/06/2023 1018   HGB 14.3 08/07/2022 1249   HCT 37.1 04/06/2023 1018   HCT 43.3 08/07/2022 1249   PLT 304 04/06/2023 1018   PLT 252 08/07/2022 1249   MCV 80.3 04/06/2023 1018   MCV 83 08/07/2022 1249   MCH 25.8 (L) 04/06/2023 1018   MCHC 32.1 04/06/2023 1018   RDW 19.5 (H) 04/06/2023 1018   RDW 15.9 (H) 08/07/2022 1249   LYMPHSABS 2.4 04/06/2023 1018   LYMPHSABS 2.0 08/07/2022 1249   MONOABS 0.6 04/06/2023 1018   EOSABS 0.2 04/06/2023 1018   EOSABS 0.3 08/07/2022 1249   BASOSABS 0.0 04/06/2023 1018   BASOSABS 0.1 08/07/2022 1249    CMP      Component Value Date/Time   NA 139 04/06/2023 1018   NA 140 08/07/2022 1249   K 3.8 04/06/2023 1018   CL 104 04/06/2023 1018   CO2 24 04/06/2023 1018   GLUCOSE 114 (H) 04/06/2023 1018   BUN 17 04/06/2023 1018   BUN 14 08/07/2022 1249   CREATININE 0.64 04/06/2023 1018   CALCIUM 9.0 04/06/2023 1018   PROT 6.4 (L) 04/06/2023 1018   PROT 6.7 08/07/2022 1249   ALBUMIN 3.6 04/06/2023 1018   ALBUMIN 4.2 08/07/2022 1249   AST 20 04/06/2023 1018   ALT 17 04/06/2023 1018   ALKPHOS 105 04/06/2023 1018   BILITOT 0.3 04/06/2023 1018   BILITOT 0.2 08/07/2022 1249   GFRNONAA >60 04/06/2023 1018   GFRAA >60 10/05/2019 0825     No results found for: "CEA1", "CEA" / No results found for: "CEA1", "CEA" No results found for: "PSA1" No results found for: "ZOX096" Lab Results  Component Value Date   CAN125 6.3 04/06/2023    No results found for: "TOTALPROTELP", "ALBUMINELP", "A1GS", "A2GS", "BETS", "BETA2SER", "GAMS", "MSPIKE", "SPEI" No results found for: "TIBC", "FERRITIN", "IRONPCTSAT" Lab Results  Component Value Date   LDH 160 02/09/2019   LDH 331 (H) 12/22/2018     STUDIES:   CT ABDOMEN PELVIS W CONTRAST Result Date: 04/10/2023 CLINICAL DATA:  Follow-up endometrial carcinoma. Surveillance. * Tracking Code: BO * EXAM: CT ABDOMEN AND PELVIS WITH CONTRAST TECHNIQUE:  Multidetector CT imaging of the abdomen and pelvis was performed using the standard protocol following bolus administration of intravenous contrast. RADIATION DOSE REDUCTION: This exam was performed according to the departmental dose-optimization program which includes automated exposure control, adjustment of the mA and/or kV according to patient size and/or use of iterative reconstruction technique. CONTRAST:  OMNIPAQUE IOHEXOL 300 MG/ML  SOLN COMPARISON:  09/30/2022 FINDINGS: Lower Chest: No acute findings. Hepatobiliary: No suspicious hepatic masses identified. Small gallstone again seen, without signs of cholecystitis or biliary ductal dilatation. Pancreas:  No mass or inflammatory changes. Spleen: Within normal limits in size and appearance. Adrenals/Urinary Tract: No suspicious masses  identified. No evidence of ureteral calculi or hydronephrosis. Stomach/Bowel: Increased size of small hiatal hernia noted. No evidence of obstruction, inflammatory process or abnormal fluid collections. Normal appendix visualized. Vascular/Lymphatic: 11 mm lymph node or peritoneal nodule adjacent to the left external iliac vessels on image 68/2 is increased in size from 7 mm previously. No other pathologically enlarged lymph nodes. No acute vascular findings. Reproductive: Prior hysterectomy. Although visualization limited by artifact from right hip prosthesis, a poorly defined soft tissue density is best seen on coronal sequence 4 along the superior and right lateral margin of the vaginal cuff which measures 2.2 x 1.3 cm, suspicious for local recurrence. A 3 mm nodule or lymph node in the sigmoid mesocolon on image 70/2 is unchanged. No evidence of ascites. Other:  None. Musculoskeletal: No suspicious bone lesions identified. Right hip prosthesis causes beam hardening artifact through the inferior pelvis. IMPRESSION: New defined 2.2 cm soft tissue density along the superior and right lateral margin of the vaginal cuff,  suspicious for local recurrence. Increased size of 11 mm lymph node or peritoneal nodule adjacent to the left external iliac vessels, consistent with metastatic disease. Stable 3 mm nodule or lymph node in sigmoid mesocolon. Increased size of small hiatal hernia. Cholelithiasis. No radiographic evidence of cholecystitis. Electronically Signed   By: Danae Orleans M.D.   On: 04/10/2023 09:30

## 2023-04-15 ENCOUNTER — Ambulatory Visit: Payer: 59

## 2023-04-15 VITALS — BP 125/75 | Ht 64.0 in | Wt 229.0 lb

## 2023-04-15 DIAGNOSIS — Z0000 Encounter for general adult medical examination without abnormal findings: Secondary | ICD-10-CM | POA: Diagnosis not present

## 2023-04-15 DIAGNOSIS — Z2821 Immunization not carried out because of patient refusal: Secondary | ICD-10-CM | POA: Diagnosis not present

## 2023-04-15 NOTE — Progress Notes (Signed)
 Because this visit was a virtual/telehealth visit,  certain criteria was not obtained, such a blood pressure, CBG if applicable, and timed get up and go. Any medications not marked as "taking" were not mentioned during the medication reconciliation part of the visit. Any vitals not documented were not able to be obtained due to this being a telehealth visit or patient was unable to self-report a recent blood pressure reading due to a lack of equipment at home via telehealth. Vitals that have been documented are verbally provided by the patient.   Subjective:   Jessica [name] is a 63 y.o. who presents for a Medicare Wellness preventive visit.  Visit Complete: Virtual I connected with  Darra Rosa Urieta on 04/15/23 by a audio enabled telemedicine application and verified that I am speaking with the correct person using two identifiers.  Patient Location: Home  Provider Location: Home Office  I discussed the limitations of evaluation and management by telemedicine. The patient expressed understanding and agreed to proceed.  Vital Signs: Because this visit was a virtual/telehealth visit, some criteria may be missing or patient reported. Any vitals not documented were not able to be obtained and vitals that have been documented are patient reported.  VideoDeclined- This patient declined Librarian, academic. Therefore the visit was completed with audio only.  Persons Participating in Visit: Patient   AWV Questionnaire: Yes: Patient Medicare AWV questionnaire was completed by the patient on 04/13/2023; I have confirmed that all information answered by patient is correct and no changes since this date.  Cardiac Risk Factors include: hypertension;dyslipidemia;obesity (BMI >30kg/m2)     Objective:    Today's Vitals   04/15/23 1504 04/15/23 1505  BP: 125/75   Weight: 229 lb (103.9 kg)   Height: 5\' 4"  (1.626 m)   PainSc:  1    Body mass index is 39.31 kg/m.      04/15/2023    3:03 PM 04/13/2023   11:20 AM 10/07/2022   10:57 AM 09/19/2022    5:47 PM 09/11/2022    1:05 PM 06/02/2022    3:38 PM 05/26/2022    2:53 PM  Advanced Directives  Does Patient Have a Medical Advance Directive? No No No No No No No  Type of Furniture conservator/restorer;Living will Healthcare Power of [place];Living will      Does patient want to make changes to medical advance directive? No - Patient declined No - Patient declined No - Patient declined    No - Patient declined  Copy of Healthcare Power of Attorney in Chart?  No - copy requested No - copy requested      Would patient like information on creating a medical advance directive? No - Patient declined No - Patient declined No - Patient declined No - Patient declined  No - Patient declined No - Patient declined    Current Medications (verified) Outpatient Encounter Medications as of 04/15/2023  Medication Sig   amLODipine (NORVASC) 5 MG tablet Take 1 tablet by mouth once daily   atorvastatin (LIPITOR) 20 MG tablet Take 1 tablet by mouth once daily   Cholecalciferol (VITAMIN D3) 25 MCG (1000 UT) CAPS Take 1 capsule (1,000 Units total) by mouth daily.   lidocaine-prilocaine (EMLA) cream Apply to port site prior to use   MAGNESIUM-OXIDE 400 (240 Mg) MG tablet Take 1 tablet by mouth twice daily   meclizine (ANTIVERT) 25 MG tablet Take 1 tablet (25 mg total) by mouth 3 (three) times daily  as needed for dizziness.   methocarbamol (ROBAXIN) 500 MG tablet Take 1 tablet (500 mg total) by mouth every 6 (six) hours as needed for muscle spasms.   nitroGLYCERIN (NITROSTAT) 0.4 MG SL tablet Place 1 tablet (0.4 mg total) under the tongue every 5 (five) minutes as needed for chest pain.   PACLITAXEL IV Inject into the vein every 21 ( twenty-one) days.   pregabalin (LYRICA) 75 MG capsule TAKE 1 CAPSULE BY MOUTH THREE TIMES DAILY   prochlorperazine (COMPAZINE) 10 MG tablet Take 1 tablet (10 mg total) by mouth every 6 (six)  hours as needed (Nausea or vomiting).   sertraline (ZOLOFT) 100 MG tablet Take 1 tablet by mouth once daily   Facility-Administered Encounter Medications as of 04/15/2023  Medication   diphenhydrAMINE (BENADRYL) 50 MG/ML injection   palonosetron (ALOXI) 0.25 MG/5ML injection    Allergies (verified) Carboplatin and Nickel   History: Past Medical History:  Diagnosis Date   Allergy    Anemia    Anxiety    Arthritis    Cancer (HCC)    [employer] 09/05/2019   Cancer of skin of back    Chest pain    Depression    Elevated blood pressure reading    Endometrial cancer (HCC)    Hyperlipidemia    Hypertension    PMB (postmenopausal bleeding)    Port-A-Cath in place 12/24/2018   Pre-diabetes    Past Surgical History:  Procedure Laterality Date   ABDOMINAL HYSTERECTOMY N/A    [employer] 09/05/2019   IR IMAGING GUIDED PORT INSERTION  12/28/2018   IR RADIOLOGIST EVAL & MGMT  06/02/2018   IR RADIOLOGIST EVAL & MGMT  06/16/2018   NECK SURGERY  2000   spinal surgery , titanium rod in place    ROBOTIC ASSISTED TOTAL HYSTERECTOMY WITH BILATERAL SALPINGO OOPHERECTOMY N/A 04/13/2018   Procedure: XI ROBOTIC ASSISTED TOTAL HYSTERECTOMY WITH BILATERAL SALPINGO OOPHORECTOMY;  Surgeon: Adolphus Birchwood, MD;  Location: [facility];  Service: Gynecology;  Laterality: N/A;   ROBOTIC PELVIC AND PARA-AORTIC LYMPH NODE DISSECTION N/A 04/13/2018   Procedure: XI ROBOTIC PELVIC LYMPHADECTOMY AND PARA-AORTIC LYMPH NODE DISSECTION;  Surgeon: Adolphus Birchwood, MD;  Location: [facility];  Service: Gynecology;  Laterality: N/A;   TOTAL HIP ARTHROPLASTY Right 09/19/2022   Procedure: RIGHT TOTAL HIP ARTHROPLASTY ANTERIOR APPROACH;  Surgeon: Kathryne Hitch, MD;  Location: [facility];  Service: Orthopedics;  Laterality: Right;   Family History  Problem Relation Age of Onset   Hypertension Mother    Breast cancer Mother    Hypertension Father    Heart disease Father    Cancer Father        lung   Cancer Sister         ovarian cancer   Melanoma Sister    Social History   Socioeconomic History   Marital status: Widowed    Spouse name: Not on file   Number of children: 3   Years of education: Not on file   Highest education level: 12th grade  Occupational History   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  Tobacco Use   Smoking status: Never   Smokeless tobacco: Never  Vaping Use   Vaping status: Never Used  Substance and Sexual Activity   Alcohol use: Yes    Alcohol/week: 4.0 standard drinks of alcohol    Types: 4 Glasses of wine per week    Comment: weekends   Drug use: Never   Sexual activity: Not Currently  Other  Topics Concern   Not on file  Social History Narrative      Wears seat belt    Does not use phone while driving    Smoke detectors at home   Fire extinguisher-no   No weapons   Social Drivers of Health   Financial Resource Strain: Low Risk  (04/15/2023)   Overall Financial Resource Strain (CARDIA)    Difficulty of Paying Living Expenses: Not very hard  Food Insecurity: No Food Insecurity (04/15/2023)   Hunger Vital Sign    Worried About Running Out of Food in the Last Year: Never true    Ran Out of Food in the Last Year: Never true  Transportation Needs: No Transportation Needs (04/15/2023)   PRAPARE - Administrator, Civil Service (Medical): No    Lack of Transportation (Non-Medical): No  Physical Activity: Sufficiently Active (04/15/2023)   Exercise Vital Sign    Days of Exercise per Week: 4 days    Minutes of Exercise per Session: 90 min  Stress: Stress Concern Present (04/15/2023)   [employer] of Occupational Health - Occupational Stress Questionnaire    Feeling of Stress : To some extent  Social Connections: Socially Isolated (04/15/2023)   Social Connection and Isolation Panel [NHANES]    Frequency of Communication with Friends and Family: Three times a week    Frequency of Social Gatherings with Friends and Family: Once a week    Attends  Religious Services: Never    Database administrator or Organizations: No    Attends Banker Meetings: Never    Marital Status: Widowed    Tobacco Counseling Counseling given: Not Answered    Clinical Intake:  Pre-visit preparation completed: Yes  Pain : 0-10 Pain Score: 1  Pain Type: Acute pain Pain Location: Back Pain Orientation: Right Pain Descriptors / Indicators: Aching Pain Onset: Today Pain Frequency: Intermittent Effect of Pain on Daily Activities: medication if needed     BMI - recorded: 29.31 Nutritional Status: BMI > 30  Obese Nutritional Risks: None Diabetes: No  Lab Results  Component Value Date   HGBA1C 6.3 (H) 07/09/2022   HGBA1C 6.3 (H) 06/12/2021        Interpreter Needed?: No  Information entered by :: [employer]   Activities of Daily Living     04/14/2023    6:58 PM 09/19/2022    5:43 PM  In your present state of health, do you have any difficulty performing the following activities:  Hearing? 0   Vision? 1   Difficulty concentrating or making decisions? 1   Walking or climbing stairs? 1   Dressing or bathing? 0   Doing errands, shopping? 0 0  Preparing Food and eating ? N   Using the Toilet? N   In the past six months, have you accidently leaked urine? Y   Do you have problems with loss of bowel control? N   Managing your Medications? N   Managing your Finances? N   Housekeeping or managing your Housekeeping? N     Patient Care Team: Billie Lade, MD as PCP - General (Internal Medicine) Mallipeddi, Orion Modest, MD as PCP - Cardiology (Cardiology) Mickie Bail, RN as Oncology Nurse Navigator Doreatha Massed, MD as Medical Oncologist (Oncology)  Indicate any recent Medical Services you may have received from other than [facility] providers in the past year (date may be approximate).     Assessment:   This is a routine wellness examination for Nyellie.  Hearing/Vision screen Hearing Screening -  Comments:: No hearing issues  Vision Screening - Comments:: Patient wears glasses    Goals Addressed             This Visit's Progress    Patient Stated   On track    Wants to stay healthy and alive. And working out       Depression Screen     04/15/2023    3:11 PM 07/09/2022    2:09 PM 02/27/2022    3:21 PM 02/27/2022    3:20 PM 12/19/2021    9:33 AM 06/18/2021    1:28 PM 02/11/2021    2:49 PM  PHQ 2/9 Scores  PHQ - 2 Score 2 2 2 2 2  0 0  PHQ- 9 Score 10 19 7 7 16  8     Fall Risk     04/14/2023    6:58 PM 07/09/2022    2:09 PM 02/27/2022    3:21 PM 12/19/2021    9:33 AM 06/18/2021    1:28 PM  Fall Risk   Falls in the past year? 0 1 1 1  0  Number falls in past yr: 0 0 0 0 0  Injury with Fall? 0 1 0 1 0  Risk for fall due to :  Impaired balance/gait;Impaired mobility No Fall Risks Impaired balance/gait;Impaired mobility No Fall Risks  Follow up Falls evaluation completed Falls evaluation completed Falls evaluation completed Falls evaluation completed Falls evaluation completed    MEDICARE RISK AT HOME:  Medicare Risk at Home Any stairs in or around the home?: (Patient-Rptd) Yes If so, are there any without handrails?: (Patient-Rptd) No Home free of loose throw rugs in walkways, pet beds, electrical cords, etc?: (Patient-Rptd) Yes Adequate lighting in your home to reduce risk of falls?: (Patient-Rptd) Yes Life alert?: (Patient-Rptd) No Use of a cane, walker or w/c?: (Patient-Rptd) No Grab bars in the bathroom?: (Patient-Rptd) No Shower chair or bench in shower?: (Patient-Rptd) No Elevated toilet seat or a handicapped toilet?: (Patient-Rptd) No  TIMED UP AND GO:  Was the test performed?  no  Cognitive Function: 6CIT completed        04/15/2023    3:06 PM 02/27/2022    3:22 PM  6CIT Screen  What Year? 0 points 0 points  What month? 0 points 0 points  What time? 0 points 0 points  Count back from 20 0 points 0 points  Months in reverse 0 points 0 points  Repeat  phrase 0 points 0 points  Total Score 0 points 0 points    Immunizations Immunization History  Administered Date(s) Administered   Influenza,inj,Quad PF,6+ Mos 10/14/2019, 10/25/2020, 10/02/2021   Moderna Covid-19 Vaccine Bivalent Booster 36yrs & up 12/21/2019, 02/15/2020   Moderna Sars-Covid-2 Vaccination 04/12/2019, 05/10/2019, 11/27/2020   Tdap 01/08/2023   Zoster Recombinant(Shingrix) 11/27/2020, 05/05/2021    Screening Tests Health Maintenance  Topic Date Due   Pneumococcal Vaccine 63-47 Years old (1 of 2 - PCV) Never done   COVID-19 Vaccine (6 - 2024-25 season) 10/05/2023 (Originally 09/21/2022)   INFLUENZA VACCINE  02/20/2024 (Originally 08/21/2022)   MAMMOGRAM  06/20/2023   Medicare Annual Wellness (AWV)  04/14/2024   Fecal DNA (Cologuard)  06/24/2024   DTaP/Tdap/Td (2 - Td or Tdap) 01/07/2033   Hepatitis C Screening  Completed   HIV Screening  Completed   Zoster Vaccines- Shingrix  Completed   HPV VACCINES  Aged Out    Health Maintenance  Health Maintenance Due  Topic Date Due  Pneumococcal Vaccine 65-92 Years old (1 of 2 - PCV) Never done   Health Maintenance Items Addressed: patient declined vaccine  Additional Screening:  Vision Screening: Recommended annual ophthalmology exams for early detection of glaucoma and other disorders of the eye.  Dental Screening: Recommended annual dental exams for proper oral hygiene  Community Resource Referral / Chronic Care Management: CRR required this visit?  No   CCM required this visit?  No     Plan:     I have personally reviewed and noted the following in the patient's chart:   Medical and social history Use of alcohol, tobacco or illicit drugs  Current medications and supplements including opioid prescriptions. Patient is not currently taking opioid prescriptions. Functional ability and status Nutritional status Physical activity Advanced directives List of other physicians Hospitalizations, surgeries,  and ER visits in previous 12 months Vitals Screenings to include cognitive, depression, and falls Referrals and appointments  In addition, I have reviewed and discussed with patient certain preventive protocols, quality metrics, and best practice recommendations. A written personalized care plan for preventive services as well as general preventive health recommendations were provided to patient.     Rudi Heap, [place]   04/15/2023   After Visit Summary: (MyChart) Due to this being a telephonic visit, the after visit summary with patients personalized plan was offered to patient via MyChart   Notes: Nothing significant to report at this time.

## 2023-04-15 NOTE — Patient Instructions (Signed)
 Ms. Bove , Thank you for taking time to come for your Medicare Wellness Visit. I appreciate your ongoing commitment to your health goals. Please review the following plan we discussed and let me know if I can assist you in the future.   Referrals/Orders/Follow-Ups/Clinician Recommendations: Follow up with your local pharmacy for pneumonia vaccine. Medicare Visit scheduled for next year  This is a list of the screening recommended for you and due dates:  Health Maintenance  Topic Date Due   Pneumococcal Vaccination (1 of 2 - PCV) Never done   COVID-19 Vaccine (6 - 2024-25 season) 10/05/2023*   Flu Shot  02/20/2024*   Mammogram  06/20/2023   Medicare Annual Wellness Visit  04/14/2024   Cologuard (Stool DNA test)  06/24/2024   DTaP/Tdap/Td vaccine (2 - Td or Tdap) 01/07/2033   Hepatitis C Screening  Completed   HIV Screening  Completed   Zoster (Shingles) Vaccine  Completed   HPV Vaccine  Aged Out  *Topic was postponed. The date shown is not the original due date.    Advanced directives: (Declined) Advance directive discussed with you today. Even though you declined this today, please call our office should you change your mind, and we can give you the proper paperwork for you to fill out.  Next Medicare Annual Wellness Visit scheduled for next year: Yes

## 2023-04-16 ENCOUNTER — Other Ambulatory Visit: Payer: Self-pay

## 2023-05-03 ENCOUNTER — Other Ambulatory Visit: Payer: Self-pay | Admitting: Internal Medicine

## 2023-05-03 DIAGNOSIS — F321 Major depressive disorder, single episode, moderate: Secondary | ICD-10-CM

## 2023-05-03 DIAGNOSIS — F419 Anxiety disorder, unspecified: Secondary | ICD-10-CM

## 2023-05-11 ENCOUNTER — Other Ambulatory Visit: Payer: Self-pay

## 2023-05-11 DIAGNOSIS — C541 Malignant neoplasm of endometrium: Secondary | ICD-10-CM

## 2023-05-12 NOTE — Progress Notes (Signed)
 [name], MD  Jessica [name] Approved for attempted CT Guided biopsy of enlarging LEFT external iliac station LN.  Hx endometrial cancer.  CT 04/06/23 Image 68 of Series 2   [facility]       Previous Messages    ----- Message ----- From: Jessica [name] Sent: 05/11/2023  12:00 PM EDT To: Jessica [name]; Ir Procedure Requests Subject: CT Biopsy - STAT                              Procedure : CT Biopsy  Reason: Increased size of 11 mm lymph node or peritoneal nodule adjacent to the left external iliac vessels, consistent with metastatic disease. Dx: Endometrial cancer (HCC) [C54.1 (ICD-10-CM)]    History : CT abd pelv w/  Provider : Paulett Boros, MD  Provider contact : [phone]

## 2023-05-15 ENCOUNTER — Other Ambulatory Visit: Payer: Self-pay | Admitting: Radiology

## 2023-05-15 DIAGNOSIS — C541 Malignant neoplasm of endometrium: Secondary | ICD-10-CM

## 2023-05-17 NOTE — H&P (Shared)
 Chief Complaint: Patient was seen in consultation today for endometrial cancer with enlarging lymph node or nodule adjacent to the left external iliac vessels, and with consideration for biopsy.  Referring Provider(s): Dr. Paulett Boros, MD   Supervising Physician: Marland Silvas  Patient Status: [facility] - Out-pt  Patient is Full Code    History of Present Illness: Jessica [name] is a 63 y.o. female  with PMHx notable for HTN, HLD, anemia, endometrial cancer, arthritis, pre-diabetes, anxiety, and depression. She has a Port-A-Cath in place.  Patient is followed by Dr. Lysbeth Sauger since 3/20 for endometrial cancer management. Most recently, CT AP on 04/06/2023 was notable for a new 2.2 cm soft tissue density along the superior and right lateral margin of the vaginal cuff suspicious for local recurrence, as well as an increased size of 11 mm lymph node, 7 mm previously. Patient was recommended for a biopsy of the soft tissue density or lymph node.    Interventional Radiology was requested for lymph node vs. soft tissue nodule biopsy. Request was reviewed and approved by Dr. Marne Sings. Patient is scheduled for same in IR today.   Patient is currently without any significant complaints apart from chronic nausea; this is mild today, has not vomited in 3 days. Patient is alert and laying in bed, calm. Patient denies any fevers, headache, chest pain, SOB, cough, abdominal pain, vomiting or bleeding.   Not on a blood thinner.    Past Medical History:  Diagnosis Date   Allergy    Anemia    Anxiety    Arthritis    Cancer (HCC)    [employer]rees[employer] 09/05/2019   Cancer of skin of back    Chest pain    Depression    Elevated blood pressure reading    Endometrial cancer (HCC)    Hyperlipidemia    Hypertension    PMB (postmenopausal bleeding)    Port-A-Cath in place 12/24/2018   Pre-diabetes     Past Surgical History:  Procedure Laterality Date   ABDOMINAL HYSTERECTOMY N/A     [employer] 09/05/2019   IR IMAGING GUIDED PORT INSERTION  12/28/2018   IR RADIOLOGIST EVAL & MGMT  06/02/2018   IR RADIOLOGIST EVAL & MGMT  06/16/2018   NECK SURGERY  2000   spinal surgery , titanium rod in place    ROBOTIC ASSISTED TOTAL HYSTERECTOMY WITH BILATERAL SALPINGO OOPHERECTOMY N/A 04/13/2018   Procedure: XI ROBOTIC ASSISTED TOTAL HYSTERECTOMY WITH BILATERAL SALPINGO OOPHORECTOMY;  Surgeon: Alphonso Aschoff, MD;  Location: [facility];  Service: Gynecology;  Laterality: N/A;   ROBOTIC PELVIC AND PARA-AORTIC LYMPH NODE DISSECTION N/A 04/13/2018   Procedure: XI ROBOTIC PELVIC LYMPHADECTOMY AND PARA-AORTIC LYMPH NODE DISSECTION;  Surgeon: Alphonso Aschoff, MD;  Location: [facility];  Service: Gynecology;  Laterality: N/A;   TOTAL HIP ARTHROPLASTY Right 09/19/2022   Procedure: RIGHT TOTAL HIP ARTHROPLASTY ANTERIOR APPROACH;  Surgeon: Arnie Lao, MD;  Location: [facility];  Service: Orthopedics;  Laterality: Right;    Allergies: Carboplatin  and Nickel  Medications: Prior to Admission medications   Medication Sig Start Date End Date Taking? Authorizing Provider  amLODipine  (NORVASC ) 5 MG tablet Take 1 tablet by mouth once daily 04/01/23   Dixon, Phillip E, MD  atorvastatin  (LIPITOR) 20 MG tablet Take 1 tablet by mouth once daily 04/01/23   Dixon, Phillip E, MD  Cholecalciferol  (VITAMIN D3) 25 MCG (1000 UT) CAPS Take 1 capsule (1,000 Units total) by mouth daily. 06/18/21   Paseda, Folashade R, FNP  lidocaine -prilocaine  (EMLA )  cream Apply to port site prior to use 10/06/22   Paulett Boros, MD  MAGNESIUM -OXIDE 400 (240 Mg) MG tablet Take 1 tablet by mouth twice daily 03/23/23   Katragadda, Sreedhar, MD  meclizine  (ANTIVERT ) 25 MG tablet Take 1 tablet (25 mg total) by mouth 3 (three) times daily as needed for dizziness. 01/08/23   [name], MD  methocarbamol  (ROBAXIN ) 500 MG tablet Take 1 tablet (500 mg total) by mouth every 6 (six) hours as needed for muscle spasms. 09/20/22   Arnie Lao, MD  nitroGLYCERIN  (NITROSTAT ) 0.4 MG SL tablet Place 1 tablet (0.4 mg total) under the tongue every 5 (five) minutes as needed for chest pain. 04/10/22 09/03/23  Mallipeddi, Vishnu P, MD  PACLITAXEL  IV Inject into the vein every 21 ( twenty-one) days. 12/29/18   [provider]  pregabalin  (LYRICA ) 75 MG capsule TAKE 1 CAPSULE BY MOUTH THREE TIMES DAILY 01/05/23   Paulett Boros, MD  prochlorperazine  (COMPAZINE ) 10 MG tablet Take 1 tablet (10 mg total) by mouth every 6 (six) hours as needed (Nausea or vomiting). 11/25/21   Paulett Boros, MD  sertraline  (ZOLOFT ) 100 MG tablet Take 1 tablet by mouth once daily 05/04/23   [name], MD     Family History  Problem Relation Age of Onset   Hypertension Mother    Breast cancer Mother    Hypertension Father    Heart disease Father    Cancer Father        lung   Cancer Sister        ovarian cancer   Melanoma Sister     Social History   Socioeconomic History   Marital status: Widowed    Spouse name: Not on file   Number of children: 3   Years of education: Not on file   Highest education level: 12th grade  Occupational History   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  Tobacco Use   Smoking status: Never   Smokeless tobacco: Never  Vaping Use   Vaping status: Never Used  Substance and Sexual Activity   Alcohol use: Yes    Alcohol/week: 4.0 standard drinks of alcohol    Types: 4 Glasses of wine per week    Comment: weekends   Drug use: Never   Sexual activity: Not Currently  Other Topics Concern   Not on file  Social History Narrative      Wears seat belt    Does not use phone while driving    Smoke detectors at home   Fire extinguisher-no   No weapons   Social Drivers of Health   Financial Resource Strain: Low Risk  (04/15/2023)   Overall Financial Resource Strain (CARDIA)    Difficulty of Paying Living Expenses: Not very hard  Food Insecurity: No Food Insecurity (04/15/2023)    Hunger Vital Sign    Worried About Running Out of Food in the Last Year: Never true    Ran Out of Food in the Last Year: Never true  Transportation Needs: No Transportation Needs (04/15/2023)   PRAPARE - Administrator, Civil Service (Medical): No    Lack of Transportation (Non-Medical): No  Physical Activity: Sufficiently Active (04/15/2023)   Exercise Vital Sign    Days of Exercise per Week: 4 days    Minutes of Exercise per Session: 90 min  Stress: Stress Concern Present (04/15/2023)   [employer] of Occupational Health - Occupational Stress Questionnaire    Feeling  of Stress : To some extent  Social Connections: Socially Isolated (04/15/2023)   Social Connection and Isolation Panel [NHANES]    Frequency of Communication with Friends and Family: Three times a week    Frequency of Social Gatherings with Friends and Family: Once a week    Attends Religious Services: Never    Database administrator or Organizations: No    Attends Banker Meetings: Never    Marital Status: Widowed     Review of Systems: A 12 point ROS discussed and pertinent positives are indicated in the HPI above.  All other systems are negative.  Vital Signs: BP 137/74   Pulse (!) 57   Temp 98.2 F (36.8 C) (Oral)   Resp 18   Ht 5\' 4"  (1.626 m)   Wt 225 lb (102.1 kg)   SpO2 94%   BMI 38.62 kg/m   Advance Care Plan: The advanced care place/surrogate decision maker was discussed at the time of visit and the patient did not wish to discuss or was not able to name a surrogate decision maker or provide an advance care plan.  Physical Exam Constitutional:      Appearance: Normal appearance.  HENT:     Mouth/Throat:     Mouth: Mucous membranes are moist.     Pharynx: Oropharynx is clear.  Cardiovascular:     Rate and Rhythm: Normal rate and regular rhythm.     Pulses: Normal pulses.     Heart sounds: Normal heart sounds.  Pulmonary:     Effort: Pulmonary effort is normal.      Breath sounds: Normal breath sounds.  Abdominal:     General: Bowel sounds are normal.     Palpations: Abdomen is soft.     Tenderness: There is no abdominal tenderness.  Musculoskeletal:     Right lower leg: No edema.     Left lower leg: No edema.  Skin:    General: Skin is warm and dry.     Comments: No rash or wounds to left groin  Neurological:     Mental Status: She is alert.     Imaging: No results found.  Labs:  CBC: Recent Labs    09/20/22 0354 09/30/22 1143 04/06/23 1018 05/18/23 0824  WBC 13.5* 9.5 6.6 7.0  HGB 10.7* 10.5* 11.9* 13.1  HCT 33.2* 32.6* 37.1 40.6  PLT 242 494* 304 293    COAGS: Recent Labs    05/18/23 0824  INR 1.0    BMP: Recent Labs    09/11/22 1322 09/20/22 0354 09/30/22 1143 04/06/23 1018  NA 139 138 135 139  K 4.2 3.8 3.5 3.8  CL 105 106 103 104  CO2 25 24 22 24   GLUCOSE 95 146* 125* 114*  BUN 19 14 13 17   CALCIUM  9.2 8.5* 8.6* 9.0  CREATININE 0.75 0.80 0.62 0.64  GFRNONAA >60 >60 >60 >60    LIVER FUNCTION TESTS: Recent Labs    08/07/22 1249 09/11/22 1322 09/30/22 1143 04/06/23 1018  BILITOT 0.2 0.5 0.7 0.3  AST 20 21 15 20   ALT 25 22 20 17   ALKPHOS 139* 112 107 105  PROT 6.7 7.4 7.0 6.4*  ALBUMIN 4.2 4.2 3.4* 3.6     Assessment and Plan: Patient is followed by Dr. Lysbeth Sauger since 3/20 for endometrial cancer management. Most recently, CT AP on 04/06/2023 was notable for a new 2.2 cm soft tissue density along the superior and right lateral margin of the vaginal cuff suspicious  for local recurrence, as well as an increased size of 11 mm lymph node, 7 mm previously. Patient was recommended for a biopsy of the soft tissue density or lymph node. Approved and schedule for 05/18/23.   All labs and medications are within acceptable parameters. No pertinent allergies. VSS. Patient has been NPO since midnight. 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   Patient presents for scheduled biopsy of  in IR today.  Risks and  benefits of  lymph node vs. soft tissue nodule biopsy was discussed with the patient and/or patient's family including, but not limited to bleeding, infection, damage to adjacent structures or low yield requiring additional tests.  All of the questions were answered and there is agreement to proceed.  Consent signed and in chart.    Thank you for allowing our service to participate in LATRISA ONSUREZ 's care.  Electronically Signed: Terressa Fess, NP   05/18/2023, 9:29 AM      I spent a total of 30 Minutes  in face to face in clinical consultation, greater than 50% of which was counseling/coordinating care for endometrial cancer with enlarging lymph node or nodule adjacent to the left external iliac vessels, and with consideration for biopsy.

## 2023-05-18 ENCOUNTER — Other Ambulatory Visit: Payer: Self-pay

## 2023-05-18 ENCOUNTER — Ambulatory Visit (HOSPITAL_COMMUNITY)
Admission: RE | Admit: 2023-05-18 | Discharge: 2023-05-18 | Disposition: A | Discharge status: Home / Self Care | Source: Ambulatory Visit | Attending: Hematology | Admitting: Hematology

## 2023-05-18 DIAGNOSIS — I10 Essential (primary) hypertension: Secondary | ICD-10-CM | POA: Insufficient documentation

## 2023-05-18 DIAGNOSIS — R7303 Prediabetes: Secondary | ICD-10-CM | POA: Insufficient documentation

## 2023-05-18 DIAGNOSIS — F419 Anxiety disorder, unspecified: Secondary | ICD-10-CM | POA: Diagnosis not present

## 2023-05-18 DIAGNOSIS — F32A Depression, unspecified: Secondary | ICD-10-CM | POA: Insufficient documentation

## 2023-05-18 DIAGNOSIS — Z01818 Encounter for other preprocedural examination: Secondary | ICD-10-CM | POA: Insufficient documentation

## 2023-05-18 DIAGNOSIS — M1990 Unspecified osteoarthritis, unspecified site: Secondary | ICD-10-CM | POA: Insufficient documentation

## 2023-05-18 DIAGNOSIS — Z8542 Personal history of malignant neoplasm of other parts of uterus: Secondary | ICD-10-CM | POA: Insufficient documentation

## 2023-05-18 DIAGNOSIS — E785 Hyperlipidemia, unspecified: Secondary | ICD-10-CM | POA: Insufficient documentation

## 2023-05-18 DIAGNOSIS — Z90710 Acquired absence of both cervix and uterus: Secondary | ICD-10-CM | POA: Diagnosis not present

## 2023-05-18 DIAGNOSIS — C541 Malignant neoplasm of endometrium: Secondary | ICD-10-CM

## 2023-05-18 LAB — CBC WITH DIFFERENTIAL/PLATELET
Abs Immature Granulocytes: 0.02 10*3/uL (ref 0.00–0.07)
Basophils Absolute: 0.1 10*3/uL (ref 0.0–0.1)
Basophils Relative: 1 %
Eosinophils Absolute: 0.2 10*3/uL (ref 0.0–0.5)
Eosinophils Relative: 3 %
HCT: 40.6 % (ref 36.0–46.0)
Hemoglobin: 13.1 g/dL (ref 12.0–15.0)
Immature Granulocytes: 0 %
Lymphocytes Relative: 37 %
Lymphs Abs: 2.6 10*3/uL (ref 0.7–4.0)
MCH: 26.8 pg (ref 26.0–34.0)
MCHC: 32.3 g/dL (ref 30.0–36.0)
MCV: 83.2 fL (ref 80.0–100.0)
Monocytes Absolute: 0.6 10*3/uL (ref 0.1–1.0)
Monocytes Relative: 8 %
Neutro Abs: 3.5 10*3/uL (ref 1.7–7.7)
Neutrophils Relative %: 51 %
Platelets: 293 10*3/uL (ref 150–400)
RBC: 4.88 MIL/uL (ref 3.87–5.11)
RDW: 17.2 % — ABNORMAL HIGH (ref 11.5–15.5)
WBC: 7 10*3/uL (ref 4.0–10.5)
nRBC: 0 % (ref 0.0–0.2)

## 2023-05-18 LAB — COMPREHENSIVE METABOLIC PANEL WITH GFR
ALT: 22 U/L (ref 0–44)
AST: 27 U/L (ref 15–41)
Albumin: 3.7 g/dL (ref 3.5–5.0)
Alkaline Phosphatase: 105 U/L (ref 38–126)
Anion gap: 11 (ref 5–15)
BUN: 17 mg/dL (ref 6–20)
CO2: 22 mmol/L (ref 22–32)
Calcium: 9.2 mg/dL (ref 8.9–10.3)
Chloride: 107 mmol/L (ref 98–111)
Creatinine, Ser: 0.71 mg/dL (ref 0.44–1.00)
GFR, Estimated: >60 mL/min (ref >60)
Glucose, Bld: 115 mg/dL — ABNORMAL HIGH (ref 70–99)
Potassium: 4.1 mmol/L (ref 3.5–5.1)
Sodium: 140 mmol/L (ref 135–145)
Total Bilirubin: 0.4 mg/dL (ref 0.0–1.2)
Total Protein: 7 g/dL (ref 6.5–8.1)

## 2023-05-18 LAB — GLUCOSE, CAPILLARY: Glucose-Capillary: 121 mg/dL — ABNORMAL HIGH (ref 70–99)

## 2023-05-18 LAB — PROTIME-INR
INR: 1 (ref 0.8–1.2)
Prothrombin Time: 13 s (ref 11.4–15.2)

## 2023-05-18 MED ORDER — FENTANYL CITRATE (PF) 100 MCG/2ML IJ SOLN
INTRAMUSCULAR | Status: AC
  Filled 2023-05-18: qty 4

## 2023-05-18 MED ORDER — HYDROCODONE-ACETAMINOPHEN 5-325 MG PO TABS: 1.0000 | ORAL_TABLET | ORAL | Status: DC | PRN

## 2023-05-18 MED ORDER — LIDOCAINE HCL 1 % IJ SOLN
10.0000 mL | Freq: Once | INTRAMUSCULAR | Status: AC
  Administered 2023-05-18: 10 mL via INTRADERMAL

## 2023-05-18 MED ORDER — SODIUM CHLORIDE 0.9 % IV SOLN: INTRAVENOUS | Status: DC

## 2023-05-18 MED ORDER — MIDAZOLAM HCL 2 MG/2ML IJ SOLN
INTRAMUSCULAR | Status: AC | PRN
  Administered 2023-05-18: .5 mg via INTRAVENOUS
  Administered 2023-05-18: 1 mg via INTRAVENOUS
  Administered 2023-05-18: .5 mg via INTRAVENOUS

## 2023-05-18 MED ORDER — MIDAZOLAM HCL 2 MG/2ML IJ SOLN
INTRAMUSCULAR | Status: AC
  Filled 2023-05-18: qty 4

## 2023-05-18 MED ORDER — FENTANYL CITRATE (PF) 100 MCG/2ML IJ SOLN
INTRAMUSCULAR | Status: AC | PRN
  Administered 2023-05-18 (×3): 25 ug via INTRAVENOUS

## 2023-05-18 NOTE — Procedures (Signed)
  Procedure:  CT core biopsy L ext iliac LAN 18g x3 in saline Preprocedure diagnosis: The encounter diagnosis was Endometrial cancer (HCC). Postprocedure diagnosis: same EBL:    minimal Complications:   none immediate  See full dictation in [employer].  Nicky Barrack MD Main # [phone] Pager  [phone] Mobile [phone]

## 2023-05-18 NOTE — Progress Notes (Signed)
 Patient was given discharge instructions. She verbalized understanding.

## 2023-05-21 LAB — SURGICAL PATHOLOGY

## 2023-05-25 ENCOUNTER — Inpatient Hospital Stay: Attending: Hematology | Admitting: Hematology

## 2023-05-25 VITALS — BP 124/84 | HR 58 | Temp 97.2°F | Resp 18 | Ht 64.0 in | Wt 227.0 lb

## 2023-05-25 DIAGNOSIS — I10 Essential (primary) hypertension: Secondary | ICD-10-CM | POA: Diagnosis not present

## 2023-05-25 DIAGNOSIS — Z8542 Personal history of malignant neoplasm of other parts of uterus: Secondary | ICD-10-CM | POA: Diagnosis present

## 2023-05-25 DIAGNOSIS — C775 Secondary and unspecified malignant neoplasm of intrapelvic lymph nodes: Secondary | ICD-10-CM | POA: Diagnosis present

## 2023-05-25 DIAGNOSIS — Z9079 Acquired absence of other genital organ(s): Secondary | ICD-10-CM | POA: Diagnosis not present

## 2023-05-25 DIAGNOSIS — Z90710 Acquired absence of both cervix and uterus: Secondary | ICD-10-CM | POA: Diagnosis not present

## 2023-05-25 DIAGNOSIS — Z90722 Acquired absence of ovaries, bilateral: Secondary | ICD-10-CM | POA: Diagnosis not present

## 2023-05-25 DIAGNOSIS — Z79899 Other long term (current) drug therapy: Secondary | ICD-10-CM | POA: Insufficient documentation

## 2023-05-25 DIAGNOSIS — C786 Secondary malignant neoplasm of retroperitoneum and peritoneum: Secondary | ICD-10-CM | POA: Diagnosis present

## 2023-05-25 DIAGNOSIS — C541 Malignant neoplasm of endometrium: Secondary | ICD-10-CM

## 2023-05-25 DIAGNOSIS — G629 Polyneuropathy, unspecified: Secondary | ICD-10-CM | POA: Diagnosis not present

## 2023-05-25 DIAGNOSIS — R928 Other abnormal and inconclusive findings on diagnostic imaging of breast: Secondary | ICD-10-CM | POA: Insufficient documentation

## 2023-05-25 NOTE — Progress Notes (Signed)
 [facility] [address] [place] [address]    Clinic Day:  05/25/2023  Referring physician: [name], MD  Patient Care Team: [name], MD as PCP - General (Internal Medicine) Mallipeddi, Kennyth Pean, MD as PCP - Cardiology (Cardiology) Wilson, [name], RN as Oncology Nurse Navigator Paulett Boros, MD as Medical Oncologist (Oncology)   ASSESSMENT & PLAN:   Assessment: 1.  Recurrent endometrial carcinosarcoma: -TAH, BSO, bilateral pelvic and para-aortic lymph node resections on 04/13/2018. -Pathology showed carcinosarcoma (malignant mixed mullerian tumor) is arising in an endometrial type polyp with no myometrial invasion identified.  High-grade.  0/39 lymph nodes positive.  PT1APN0, FIGO stage Ia.  MMR normal.  MSI-stable. -CTAP on 12/09/2018 for abdominal pain showed extensive peritoneal carcinomatosis and large soft tissue mass in the pelvis. -Biopsy of the omental mass on 12/28/2018 shows poorly differentiated carcinoma consistent with her prior malignancy. -Carboplatin  and paclitaxel  started on 12/29/2018. -CT scan on 05/02/2019 showed peritoneal implants in the low central small bowel mesentery and left pelvic sidewall have decreased in size measuring 1.5 x 1.9 cm and 1.7 x 2.2 cm.  Soft tissue nodule in the left lateral omentum measures 1 x 1.7 cm with no evidence of metastatic disease. -Continuation of chemotherapy until complete response was recommended. -CT AP on 07/04/2019 showed continued positive response to therapy with scattered peritoneal metastasis in the pelvis and left omentum decreased in size.  No new metastatic disease. -CTAP on 10/24/2019 after 14 cycles showed substantial reduction in size of soft tissue nodules in the pelvis.  1.4 x 0.9 x 1 cm soft tissue density in the inferior serosal margin of the sigmoid colon, previously 1.7 x 1.1 x 1.7 cm.  2 small soft tissue nodules in the mesenteric adipose tissue above the urinary  bladder measures 0.8 cm. -She has developed serious allergic reaction during cycle 14 with carboplatin . -I have discussed with Dr. Pearly Bound.  Other options include adding ifosfamide to Taxol  or Doxil and combination of lenvatinib with pembrolizumab.  In the lenvatinib pembrolizumab trial carcinosarcomas were not included. -Single agent paclitaxel  started on 11/09/2019, dose reduced by 20% on 01/18/2020.  Paclitaxel  held on 08/22/2021 as a chemo holiday. -CTAP on 01/10/2020 with stable small peritoneal soft tissue nodules in the pelvis.  Resolution of soft tissue nodularity in the left abdominal omental fat with no new or progressive disease.    Plan: 1.  Recurrent endometrial carcinosarcoma: - CTAP (04/06/2023): New 2.2 cm soft tissue density along the superior and right lateral margin of the vaginal cuff suspicious for local recurrence.  Increased size of 11 mm lymph node, 7 mm previously. - We reviewed results of Guardant360 (04/20/2022): MPL [id number].  No other targetable mutations. - Reported spasm in the left side of the neck.  No palpable adenopathy or masses.  She also had 2 days of diarrhea, 2 episodes on 1 day and 1 episode the next day, last episode 2 weeks ago. - Reviewed biopsy results from 05/18/2023: Carcinosarcoma of the left external iliac lymph node. - Recommend CT chest without IV contrast as a baseline prior to starting the therapy. - Recommend NGS testing on the latest biopsy. - Treatment options include chemotherapy with paclitaxel  or Doxil.  Now on chemo options include lenvatinib/pembrolizumab/Enhertu/NTRK inhibitors based on NGS results. - RTC 3 weeks for follow-up.    2.  Low back pain/right-sided abdominal pain: - She had right THR on 09/19/2022.  Not requiring oxycodone  for the last few months.  3.  Hypertension: - Continue Norvasc  5 mg daily.  Blood pressure is normal.   4.  Neuropathy in the legs: - Continue Lyrica  75 mg twice daily.  Neuropathy stable.   5.  Abnormal  mammogram: - She has not had a mammogram since 2023.  Will consider ordering mammogram.  6.  Hypomagnesemia: - Continue magnesium  twice daily.  Last magnesium  was normal.    Orders Placed This Encounter  Procedures   CT CHEST W CONTRAST    Standing Status:   Future    Expected Date:   06/08/2023    Expiration Date:   05/24/2024    If indicated for the ordered procedure, I authorize the administration of contrast media per Radiology protocol:   Yes    Does the patient have a contrast media/X-ray dye allergy?:   No    Preferred imaging location?:   [facility] R Teague,acting as a scribe for Paulett Boros, MD.,have documented all relevant documentation on the behalf of Paulett Boros, MD,as directed by  Paulett Boros, MD while in the presence of Paulett Boros, MD.  I, Paulett Boros MD, have reviewed the above documentation for accuracy and completeness, and I agree with the above.    Paulett Boros, MD   5/5/20252:49 PM  CHIEF COMPLAINT:   Diagnosis: recurrent endometrial cancer    Cancer Staging  No matching staging information was found for the patient.    Prior Therapy: 1. TAH & BSO, LNDs on 04/13/2018 2. Carboplatin /Paclitaxel , 12/29/18 - 10/05/19 3. Maintenance Paclitaxel , last dose on 08/22/2021  Current Therapy:  surveillance    HISTORY OF PRESENT ILLNESS:   Oncology History  Endometrial cancer (HCC)  03/31/2018 Initial Diagnosis   Endometrial cancer (HCC)   12/29/2018 -  Chemotherapy   Patient is on Treatment Plan : UTERINE Carboplatin  AUC 6 / Paclitaxel  q21d     06/06/2020 Miscellaneous   Summary of oncologic history from Dr. Diogo Anne   1.  Recurrent endometrial carcinosarcoma: -TAH, BSO, bilateral pelvic and para-aortic lymph node resections on 04/13/2018. -Pathology showed carcinosarcoma (malignant mixed mullerian tumor) is arising in an endometrial type polyp with no myometrial invasion identified.   High-grade.  0/39 lymph nodes positive.  PT1APN0, FIGO stage Ia.  MMR normal.  MSI-stable. -CTAP on 12/09/2018 for abdominal pain showed extensive peritoneal carcinomatosis and large soft tissue mass in the pelvis. -Biopsy of the omental mass on 12/28/2018 shows poorly differentiated carcinoma consistent with her prior malignancy. -Carboplatin  and paclitaxel  started on 12/29/2018. -CT scan on 05/02/2019 showed peritoneal implants in the low central small bowel mesentery and left pelvic sidewall have decreased in size measuring 1.5 x 1.9 cm and 1.7 x 2.2 cm.  Soft tissue nodule in the left lateral omentum measures 1 x 1.7 cm with no evidence of metastatic disease. -Continuation of chemotherapy until complete response was recommended. -CT AP on 07/04/2019 showed continued positive response to therapy with scattered peritoneal metastasis in the pelvis and left omentum decreased in size.  No new metastatic disease. -CTAP on 10/24/2019 after 14 cycles showed substantial reduction in size of soft tissue nodules in the pelvis.  1.4 x 0.9 x 1 cm soft tissue density in the inferior serosal margin of the sigmoid colon, previously 1.7 x 1.1 x 1.7 cm.  2 small soft tissue nodules in the mesenteric adipose tissue above the urinary bladder measures 0.8 cm. -She has developed serious allergic reaction during cycle 14 with carboplatin . -I have discussed with Dr. Pearly Bound.  Other options include adding ifosfamide to Taxol  or Doxil and combination of lenvatinib with pembrolizumab.  In the lenvatinib pembrolizumab trial carcinosarcomas were not included. -Single agent paclitaxel  started on 11/09/2019, dose reduced by 20% on 01/18/2020. -CTAP on 01/10/2020 with stable small peritoneal soft tissue nodules in the pelvis.  Resolution of soft tissue nodularity in the left abdominal omental fat with no new or progressive disease.     06/20/2020 Imaging   1. Stable examination status post hysterectomy without suspicious enhancing soft  tissue nodularity at the vaginal cuff and no significant change in the multiple small soft tissue nodules in the pelvis likely representing treated tumor. No new or progressive findings. 2. Cholelithiasis including a large gallstone in the neck of the gallbladder but without CT evidence of acute cholecystitis. 3. Small hiatal hernia. 4. Aortic atherosclerosis.        INTERVAL HISTORY:   Jessica [name] is a 62 y.o. female presenting to clinic today for follow up of recurrent endometrial cancer. She was last seen by me on 04/13/23.  Since her last visit, she underwent left external iliac lymph node biopsy on 05/18/23. Pathology revealed: poorly differentiated malignant neoplasm with dual cell population and areas of squamous differentiation.  Today, she states that she is doing well overall. Her appetite level is at 100%. Her energy level is at 50%.  PAST MEDICAL HISTORY:   Past Medical History: Past Medical History:  Diagnosis Date   Allergy    Anemia    Anxiety    Arthritis    Cancer (HCC)    Phreesia 09/05/2019   Cancer of skin of back    Chest pain    Depression    Elevated blood pressure reading    Endometrial cancer (HCC)    Hyperlipidemia    Hypertension    PMB (postmenopausal bleeding)    Port-A-Cath in place 12/24/2018   Pre-diabetes     Surgical History: Past Surgical History:  Procedure Laterality Date   ABDOMINAL HYSTERECTOMY N/A    Phreesia 09/05/2019   IR IMAGING GUIDED PORT INSERTION  12/28/2018   IR RADIOLOGIST EVAL & MGMT  06/02/2018   IR RADIOLOGIST EVAL & MGMT  06/16/2018   NECK SURGERY  2000   spinal surgery , titanium rod in place    ROBOTIC ASSISTED TOTAL HYSTERECTOMY WITH BILATERAL SALPINGO OOPHERECTOMY N/A 04/13/2018   Procedure: XI ROBOTIC ASSISTED TOTAL HYSTERECTOMY WITH BILATERAL SALPINGO OOPHORECTOMY;  Surgeon: Alphonso Aschoff, MD;  Location: [facility];  Service: Gynecology;  Laterality: N/A;   ROBOTIC PELVIC AND PARA-AORTIC LYMPH NODE DISSECTION N/A  04/13/2018   Procedure: XI ROBOTIC PELVIC LYMPHADECTOMY AND PARA-AORTIC LYMPH NODE DISSECTION;  Surgeon: Alphonso Aschoff, MD;  Location: [facility];  Service: Gynecology;  Laterality: N/A;   TOTAL HIP ARTHROPLASTY Right 09/19/2022   Procedure: RIGHT TOTAL HIP ARTHROPLASTY ANTERIOR APPROACH;  Surgeon: Arnie Lao, MD;  Location: [facility];  Service: Orthopedics;  Laterality: Right;    Social History: Social History   Socioeconomic History   Marital status: Widowed    Spouse name: Not on file   Number of children: 3   Years of education: Not on file   Highest education level: 12th grade  Occupational History   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  Tobacco Use   Smoking status: Never   Smokeless tobacco: Never  Vaping Use   Vaping status: Never Used  Substance and Sexual Activity   Alcohol use: Yes    Alcohol/week: 4.0 standard drinks of alcohol  Types: 4 Glasses of wine per week    Comment: weekends   Drug use: Never   Sexual activity: Not Currently  Other Topics Concern   Not on file  Social History Narrative      Wears seat belt    Does not use phone while driving    Smoke detectors at home   Fire extinguisher-no   No weapons   Social Drivers of Health   Financial Resource Strain: Low Risk  (04/15/2023)   Overall Financial Resource Strain (CARDIA)    Difficulty of Paying Living Expenses: Not very hard  Food Insecurity: No Food Insecurity (04/15/2023)   Hunger Vital Sign    Worried About Running Out of Food in the Last Year: Never true    Ran Out of Food in the Last Year: Never true  Transportation Needs: No Transportation Needs (04/15/2023)   PRAPARE - Administrator, Civil Service (Medical): No    Lack of Transportation (Non-Medical): No  Physical Activity: Sufficiently Active (04/15/2023)   Exercise Vital Sign    Days of Exercise per Week: 4 days    Minutes of Exercise per Session: 90 min  Stress: Stress Concern Present (04/15/2023)   [employer] of Occupational Health - Occupational Stress Questionnaire    Feeling of Stress : To some extent  Social Connections: Socially Isolated (04/15/2023)   Social Connection and Isolation Panel [NHANES]    Frequency of Communication with Friends and Family: Three times a week    Frequency of Social Gatherings with Friends and Family: Once a week    Attends Religious Services: Never    Database administrator or Organizations: No    Attends Banker Meetings: Never    Marital Status: Widowed  Intimate Partner Violence: Not At Risk (04/15/2023)   Humiliation, Afraid, Rape, and Kick questionnaire    Fear of Current or Ex-Partner: No    Emotionally Abused: No    Physically Abused: No    Sexually Abused: No    Family History: Family History  Problem Relation Age of Onset   Hypertension Mother    Breast cancer Mother    Hypertension Father    Heart disease Father    Cancer Father        lung   Cancer Sister        ovarian cancer   Melanoma Sister     Current Medications:  Current Outpatient Medications:    amLODipine  (NORVASC ) 5 MG tablet, Take 1 tablet by mouth once daily, Disp: 90 tablet, Rfl: 0   atorvastatin  (LIPITOR) 20 MG tablet, Take 1 tablet by mouth once daily, Disp: 90 tablet, Rfl: 0   Cholecalciferol  (VITAMIN D3) 25 MCG (1000 UT) CAPS, Take 1 capsule (1,000 Units total) by mouth daily., Disp: 60 capsule, Rfl: 2   lidocaine -prilocaine  (EMLA ) cream, Apply to port site prior to use, Disp: 30 g, Rfl: 0   MAGNESIUM -OXIDE 400 (240 Mg) MG tablet, Take 1 tablet by mouth twice daily, Disp: 60 tablet, Rfl: 0   meclizine  (ANTIVERT ) 25 MG tablet, Take 1 tablet (25 mg total) by mouth 3 (three) times daily as needed for dizziness., Disp: 30 tablet, Rfl: 3   methocarbamol  (ROBAXIN ) 500 MG tablet, Take 1 tablet (500 mg total) by mouth every 6 (six) hours as needed for muscle spasms., Disp: 40 tablet, Rfl: 1   nitroGLYCERIN  (NITROSTAT ) 0.4 MG SL tablet, Place 1 tablet (0.4  mg total) under the tongue every 5 (five) minutes as needed  for chest pain., Disp: 25 tablet, Rfl: 3   PACLITAXEL  IV, Inject into the vein every 21 ( twenty-one) days., Disp: , Rfl:    pregabalin  (LYRICA ) 75 MG capsule, TAKE 1 CAPSULE BY MOUTH THREE TIMES DAILY, Disp: 90 capsule, Rfl: 2   prochlorperazine  (COMPAZINE ) 10 MG tablet, Take 1 tablet (10 mg total) by mouth every 6 (six) hours as needed (Nausea or vomiting)., Disp: 60 tablet, Rfl: 3   sertraline  (ZOLOFT ) 100 MG tablet, Take 1 tablet by mouth once daily, Disp: 30 tablet, Rfl: 0 No current facility-administered medications for this visit.  Facility-Administered Medications Ordered in Other Visits:    diphenhydrAMINE  (BENADRYL ) 50 MG/ML injection, , , ,    palonosetron  (ALOXI ) 0.25 MG/5ML injection, , , ,    Allergies: Allergies  Allergen Reactions   Carboplatin  Other (See Comments)    "body on fire and abdominal pain"   Nickel Rash    itchy    REVIEW OF SYSTEMS:   Review of Systems  Constitutional:  Negative for chills, fatigue and fever.  HENT:   Negative for lump/mass, mouth sores, nosebleeds, sore throat and trouble swallowing.   Eyes:  Negative for eye problems.  Respiratory:  Positive for cough. Negative for shortness of breath.   Cardiovascular:  Negative for chest pain, leg swelling and palpitations.  Gastrointestinal:  Positive for diarrhea and nausea. Negative for abdominal pain, constipation and vomiting.  Genitourinary:  Negative for bladder incontinence, difficulty urinating, dysuria, frequency, hematuria and nocturia.   Musculoskeletal:  Negative for arthralgias, back pain, flank pain, myalgias and neck pain.  Skin:  Negative for itching and rash.  Neurological:  Positive for numbness. Negative for dizziness and headaches.  Hematological:  Does not bruise/bleed easily.  Psychiatric/Behavioral:  Negative for depression, sleep disturbance and suicidal ideas. The patient is not nervous/anxious.   All other systems  reviewed and are negative.    VITALS:   Blood pressure 124/84, pulse (!) 58, temperature (!) 97.2 F (36.2 C), temperature source Tympanic, resp. rate 18, height 5\' 4"  (1.626 m), weight 227 lb (103 kg), SpO2 97%.  Wt Readings from Last 3 Encounters:  05/25/23 227 lb (103 kg)  05/18/23 225 lb (102.1 kg)  04/15/23 229 lb (103.9 kg)    Body mass index is 38.96 kg/m.  Performance status (ECOG): 1 - Symptomatic but completely ambulatory  PHYSICAL EXAM:   Physical Exam Vitals and nursing note reviewed. Exam conducted with a chaperone present.  Constitutional:      Appearance: Normal appearance.  Cardiovascular:     Rate and Rhythm: Normal rate and regular rhythm.     Pulses: Normal pulses.     Heart sounds: Normal heart sounds.  Pulmonary:     Effort: Pulmonary effort is normal.     Breath sounds: Normal breath sounds.  Abdominal:     Palpations: Abdomen is soft. There is no hepatomegaly, splenomegaly or mass.     Tenderness: There is no abdominal tenderness.  Musculoskeletal:     Right lower leg: No edema.     Left lower leg: No edema.  Lymphadenopathy:     Cervical: No cervical adenopathy.     Right cervical: No superficial, deep or posterior cervical adenopathy.    Left cervical: No superficial, deep or posterior cervical adenopathy.     Upper Body:     Right upper body: No supraclavicular or axillary adenopathy.     Left upper body: No supraclavicular or axillary adenopathy.  Neurological:     General: No  focal deficit present.     Mental Status: She is alert and oriented to person, place, and time.  Psychiatric:        Mood and Affect: Mood normal.        Behavior: Behavior normal.     LABS:   CBC     Component Value Date/Time   WBC 7.0 05/18/2023 0824   RBC 4.88 05/18/2023 0824   HGB 13.1 05/18/2023 0824   HGB 14.3 08/07/2022 1249   HCT 40.6 05/18/2023 0824   HCT 43.3 08/07/2022 1249   PLT 293 05/18/2023 0824   PLT 252 08/07/2022 1249   MCV 83.2  05/18/2023 0824   MCV 83 08/07/2022 1249   MCH 26.8 05/18/2023 0824   MCHC 32.3 05/18/2023 0824   RDW 17.2 (H) 05/18/2023 0824   RDW 15.9 (H) 08/07/2022 1249   LYMPHSABS 2.6 05/18/2023 0824   LYMPHSABS 2.0 08/07/2022 1249   MONOABS 0.6 05/18/2023 0824   EOSABS 0.2 05/18/2023 0824   EOSABS 0.3 08/07/2022 1249   BASOSABS 0.1 05/18/2023 0824   BASOSABS 0.1 08/07/2022 1249    CMP      Component Value Date/Time   NA 140 05/18/2023 0824   NA 140 08/07/2022 1249   K 4.1 05/18/2023 0824   CL 107 05/18/2023 0824   CO2 22 05/18/2023 0824   GLUCOSE 115 (H) 05/18/2023 0824   BUN 17 05/18/2023 0824   BUN 14 08/07/2022 1249   CREATININE 0.71 05/18/2023 0824   CALCIUM  9.2 05/18/2023 0824   PROT 7.0 05/18/2023 0824   PROT 6.7 08/07/2022 1249   ALBUMIN 3.7 05/18/2023 0824   ALBUMIN 4.2 08/07/2022 1249   AST 27 05/18/2023 0824   ALT 22 05/18/2023 0824   ALKPHOS 105 05/18/2023 0824   BILITOT 0.4 05/18/2023 0824   BILITOT 0.2 08/07/2022 1249   GFRNONAA >60 05/18/2023 0824   GFRAA >60 10/05/2019 0825     No results found for: "CEA1", "CEA" / No results found for: "CEA1", "CEA" No results found for: "PSA1" No results found for: "WGN562" Lab Results  Component Value Date   CAN125 6.3 04/06/2023    No results found for: "TOTALPROTELP", "ALBUMINELP", "A1GS", "A2GS", "BETS", "BETA2SER", "GAMS", "MSPIKE", "SPEI" No results found for: "TIBC", "FERRITIN", "IRONPCTSAT" Lab Results  Component Value Date   LDH 160 02/09/2019   LDH 331 (H) 12/22/2018     STUDIES:   CT BIOPSY Result Date: 05/18/2023 CLINICAL DATA:  Enlarging left external iliac lymph node. History of endometrial carcinoma EXAM: CT GUIDED CORE BIOPSY OF LEFT EXTERNAL ILIAC ADENOPATHY ANESTHESIA/SEDATION: Intravenous Fentanyl  75mcg and Versed  2mg  were administered by RN during a total moderate (conscious) sedation time of 13 minutes; the patient's level of consciousness and physiological / cardiorespiratory status were  monitored continuously by radiology RN under my direct supervision. PROCEDURE: The procedure risks, benefits, and alternatives were explained to the patient. Questions regarding the procedure were encouraged and answered. The patient understands and consents to the procedure. Limited CT of the pelvis was obtained. Left external iliac node localized. Appropriate skin entry site determined and marked. The operative field was prepped with chlorhexidinein a sterile fashion, and a sterile drape was applied covering the operative field. A sterile gown and sterile gloves were used for the procedure. Local anesthesia was provided with 1% Lidocaine . Under CT fluoroscopic guidance, 17 gauge trocar needle advanced to the margin of the lesion. Coaxial 18 gauge core biopsy samples were obtained, submitted in saline to surgical pathology. Postprocedure scans show no hemorrhage  or other apparent complication. The patient tolerated the procedure well. RADIATION DOSE REDUCTION: This exam was performed according to the departmental dose-optimization program which includes automated exposure control, adjustment of the mA and/or kV according to patient size and/or use of iterative reconstruction technique. COMPLICATIONS: None immediate FINDINGS: Enlarged left external iliac lymph node was localized. Representative 18 gauge core biopsy samples were obtained. IMPRESSION: 1. Technically successful CT-guided core biopsy, left external iliac adenopathy. Electronically Signed   By: Nicoletta Barrier M.D.   On: 05/18/2023 12:15

## 2023-05-25 NOTE — Patient Instructions (Addendum)
 [facility] at [facility] Discharge Instructions   You were seen and examined today by Dr. Cheree Cords.  He reviewed the results of your Guardant 360 test. It did not show any mutations to target. We will send molecular testing on your most recent biopsy tissue. This will look for mutations in the cancer and give us  the information we need to decide on treatment.    We will see you back in 3 weeks. We will obtain a CT of your chest prior to this visit.   Return as scheduled.    Thank you for choosing  Cancer Center at [facility] to provide your oncology and hematology care.  To afford each patient quality time with our provider, please arrive at least 15 minutes before your scheduled appointment time.   If you have a lab appointment with the Cancer Center please come in thru the Main Entrance and check in at the main information desk.  You need to re-schedule your appointment should you arrive 10 or more minutes late.  We strive to give you quality time with our providers, and arriving late affects you and other patients whose appointments are after yours.  Also, if you no show three or more times for appointments you may be dismissed from the clinic at the providers discretion.     Again, thank you for choosing [facility].  Our hope is that these requests will decrease the amount of time that you wait before being seen by our physicians.       _____________________________________________________________  Should you have questions after your visit to [facility], please contact our office at [phone] and follow the prompts.  Our office hours are 8:00 a.m. and 4:30 p.m. Monday - Friday.  Please note that voicemails left after 4:00 p.m. may not be returned until the following business day.  We are closed weekends and major holidays.  You do have access to a nurse 24-7, just call the main number to the clinic [phone]  and do not press any options, hold on the line and a nurse will answer the phone.    For prescription refill requests, have your pharmacy contact our office and allow 72 hours.    Due to Covid, you will need to wear a mask upon entering the hospital. If you do not have a mask, a mask will be given to you at the Main Entrance upon arrival. For doctor visits, patients may have 1 support person age 58 or older with them. For treatment visits, patients can not have anyone with them due to social distancing guidelines and our immunocompromised population.

## 2023-06-01 ENCOUNTER — Other Ambulatory Visit: Payer: Self-pay | Admitting: Hematology

## 2023-06-01 ENCOUNTER — Other Ambulatory Visit: Payer: Self-pay | Admitting: Internal Medicine

## 2023-06-01 DIAGNOSIS — F419 Anxiety disorder, unspecified: Secondary | ICD-10-CM

## 2023-06-01 DIAGNOSIS — F321 Major depressive disorder, single episode, moderate: Secondary | ICD-10-CM

## 2023-06-09 ENCOUNTER — Ambulatory Visit
Admission: EM | Admit: 2023-06-09 | Discharge: 2023-06-09 | Disposition: A | Discharge status: Home / Self Care | Attending: Nurse Practitioner | Admitting: Nurse Practitioner

## 2023-06-09 ENCOUNTER — Encounter: Payer: Self-pay | Admitting: Emergency Medicine

## 2023-06-09 DIAGNOSIS — R399 Unspecified symptoms and signs involving the genitourinary system: Secondary | ICD-10-CM | POA: Insufficient documentation

## 2023-06-09 DIAGNOSIS — Z8542 Personal history of malignant neoplasm of other parts of uterus: Secondary | ICD-10-CM | POA: Insufficient documentation

## 2023-06-09 LAB — POCT URINALYSIS DIP (MANUAL ENTRY)
Bilirubin, UA: NEGATIVE
Glucose, UA: NEGATIVE mg/dL
Ketones, POC UA: NEGATIVE mg/dL
Nitrite, UA: NEGATIVE
Protein Ur, POC: 100 mg/dL — AB
Spec Grav, UA: 1.015 (ref 1.010–1.025)
Urobilinogen, UA: 0.2 U/dL
pH, UA: 7 (ref 5.0–8.0)

## 2023-06-09 MED ORDER — SULFAMETHOXAZOLE-TRIMETHOPRIM 800-160 MG PO TABS: 1.0000 | ORAL_TABLET | Freq: Two times a day (BID) | ORAL | 0 refills | Status: AC

## 2023-06-09 NOTE — ED Provider Notes (Signed)
 [facility]    CSN: [id number] Arrival date & time: 06/09/23  0840      History   Chief Complaint No chief complaint on file.   HPI Jessica [name] is a 63 y.o. female.   The history is provided by the patient.   Patient presents for a 1 day history of urinary urgency, hematuria, low back pain, and dysuria.  Patient also complains of feeling "bloated" in her pelvic region.  She denies fever, chills, chest pain, abdominal pain, nausea, vomiting, decreased urine stream, flank pain, urinary frequency, or vaginal symptoms.  Patient denies history of recurrent UTIs.  States that she is undergoing treatment for endometrial cancer.  States that she is unsure if the bleeding that she is experienced is coming from her vaginal region or from her urine. Past Medical History:  Diagnosis Date   Allergy    Anemia    Anxiety    Arthritis    Cancer (HCC)    [employer]reesia 09/05/2019   Cancer of skin of back    Chest pain    Depression    Elevated blood pressure reading    Endometrial cancer (HCC)    Hyperlipidemia    Hypertension    PMB (postmenopausal bleeding)    Port-A-Cath in place 12/24/2018   Pre-diabetes     Patient Active Problem List   Diagnosis Date Noted   Need for Tdap vaccination 01/08/2023   BPPV (benign paroxysmal positional vertigo) 01/08/2023   Status post total replacement of right hip 09/19/2022   Cardiac chest pain 04/10/2022   Cholelithiasis 12/20/2021   Vitamin D  deficiency 06/18/2021   Prediabetes 06/18/2021   Annual physical exam 06/18/2021   Impaired vision in both eyes 06/18/2021   Breast tenderness in female 06/18/2021   Headache 02/11/2021   Morbid obesity (HCC) 01/28/2021   Insomnia 01/28/2021   SIRS (systemic inflammatory response syndrome) (HCC) 12/07/2020   Hypotension    Peripheral neuropathy due to chemotherapy (HCC) 06/27/2020   Anemia due to antineoplastic chemotherapy 06/27/2020   Depression, major, single episode, moderate (HCC)  09/07/2019   Anxiety 09/07/2019   Port-A-Cath in place 12/24/2018   Goals of care, counseling/discussion 12/23/2018   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HCC) 12/14/2018   Elevated LFTs 05/05/2018   Hyperlipidemia 04/27/2018   Essential hypertension 04/26/2018   Endometrial cancer (HCC) 03/31/2018   H/O total hysterectomy 03/2018    Past Surgical History:  Procedure Laterality Date   ABDOMINAL HYSTERECTOMY N/A    [employer]reesia 09/05/2019   IR IMAGING GUIDED PORT INSERTION  12/28/2018   IR RADIOLOGIST EVAL & MGMT  06/02/2018   IR RADIOLOGIST EVAL & MGMT  06/16/2018   NECK SURGERY  2000   spinal surgery , titanium rod in place    ROBOTIC ASSISTED TOTAL HYSTERECTOMY WITH BILATERAL SALPINGO OOPHERECTOMY N/A 04/13/2018   Procedure: XI ROBOTIC ASSISTED TOTAL HYSTERECTOMY WITH BILATERAL SALPINGO OOPHORECTOMY;  Surgeon: Alphonso Aschoff, MD;  Location: [facility];  Service: Gynecology;  Laterality: N/A;   ROBOTIC PELVIC AND PARA-AORTIC LYMPH NODE DISSECTION N/A 04/13/2018   Procedure: XI ROBOTIC PELVIC LYMPHADECTOMY AND PARA-AORTIC LYMPH NODE DISSECTION;  Surgeon: Alphonso Aschoff, MD;  Location: [facility];  Service: Gynecology;  Laterality: N/A;   TOTAL HIP ARTHROPLASTY Right 09/19/2022   Procedure: RIGHT TOTAL HIP ARTHROPLASTY ANTERIOR APPROACH;  Surgeon: Arnie Lao, MD;  Location: [facility];  Service: Orthopedics;  Laterality: Right;    OB History     Gravida  4   Para  3  Term  3   Preterm  0   AB  1   Living  3      SAB  0   IAB  1   Ectopic  0   Multiple  0   Live Births  3            Home Medications    Prior to Admission medications   Medication Sig Start Date End Date Taking? Authorizing Provider  sulfamethoxazole-trimethoprim (BACTRIM DS) 800-160 MG tablet Take 1 tablet by mouth 2 (two) times daily for 7 days. 06/09/23 06/16/23 Yes Leath-Warren, Belen Bowers, NP  amLODipine  (NORVASC ) 5 MG tablet Take 1 tablet by mouth once daily 04/01/23   Dixon,  Phillip E, MD  atorvastatin  (LIPITOR) 20 MG tablet Take 1 tablet by mouth once daily 04/01/23   Dixon, Phillip E, MD  Cholecalciferol  (VITAMIN D3) 25 MCG (1000 UT) CAPS Take 1 capsule (1,000 Units total) by mouth daily. 06/18/21   Paseda, Folashade R, FNP  lidocaine -prilocaine  (EMLA ) cream Apply to port site prior to use 10/06/22   Paulett Boros, MD  MAGNESIUM -OXIDE 400 (240 Mg) MG tablet Take 1 tablet by mouth twice daily 06/01/23   Katragadda, Sreedhar, MD  meclizine  (ANTIVERT ) 25 MG tablet Take 1 tablet (25 mg total) by mouth 3 (three) times daily as needed for dizziness. 01/08/23   [name], MD  methocarbamol  (ROBAXIN ) 500 MG tablet Take 1 tablet (500 mg total) by mouth every 6 (six) hours as needed for muscle spasms. 09/20/22   Arnie Lao, MD  nitroGLYCERIN  (NITROSTAT ) 0.4 MG SL tablet Place 1 tablet (0.4 mg total) under the tongue every 5 (five) minutes as needed for chest pain. 04/10/22 09/03/23  Mallipeddi, Vishnu P, MD  PACLITAXEL  IV Inject into the vein every 21 ( twenty-one) days. 12/29/18   [provider]  pregabalin  (LYRICA ) 75 MG capsule TAKE 1 CAPSULE BY MOUTH THREE TIMES DAILY 01/05/23   Paulett Boros, MD  prochlorperazine  (COMPAZINE ) 10 MG tablet Take 1 tablet (10 mg total) by mouth every 6 (six) hours as needed (Nausea or vomiting). 11/25/21   Paulett Boros, MD  sertraline  (ZOLOFT ) 100 MG tablet Take 1 tablet by mouth once daily 06/01/23   [name], MD    Family History Family History  Problem Relation Age of Onset   Hypertension Mother    Breast cancer Mother    Hypertension Father    Heart disease Father    Cancer Father        lung   Cancer Sister        ovarian cancer   Melanoma Sister     Social History Social History   Tobacco Use   Smoking status: Never   Smokeless tobacco: Never  Vaping Use   Vaping status: Never Used  Substance Use Topics   Alcohol use: Yes    Alcohol/week: 4.0 standard drinks of  alcohol    Types: 4 Glasses of wine per week    Comment: weekends   Drug use: Never     Allergies   Carboplatin  and Nickel   Review of Systems Review of Systems Per HPI  Physical Exam Triage Vital Signs ED Triage Vitals  Encounter Vitals Group     BP 06/09/23 0857 127/79     Systolic BP Percentile --      Diastolic BP Percentile --      Pulse Rate 06/09/23 0857 65     Resp 06/09/23 0857 18     Temp  06/09/23 0857 98.1 F (36.7 C)     Temp Source 06/09/23 0857 Oral     SpO2 06/09/23 0857 93 %     Weight --      Height --      Head Circumference --      Peak Flow --      Pain Score 06/09/23 0859 1     Pain Loc --      Pain Education --      Exclude from Growth Chart --    No data found.  Updated Vital Signs BP 127/79 (BP Location: Right Arm)   Pulse 65   Temp 98.1 F (36.7 C) (Oral)   Resp 18   SpO2 93%   Visual Acuity Right Eye Distance:   Left Eye Distance:   Bilateral Distance:    Right Eye Near:   Left Eye Near:    Bilateral Near:     Physical Exam Vitals and nursing note reviewed.  Constitutional:      General: She is not in acute distress.    Appearance: Normal appearance.  HENT:     Head: Normocephalic.  Eyes:     Extraocular Movements: Extraocular movements intact.     Pupils: Pupils are equal, round, and reactive to light.  Cardiovascular:     Rate and Rhythm: Normal rate and regular rhythm.     Pulses: Normal pulses.     Heart sounds: Normal heart sounds.  Pulmonary:     Effort: Pulmonary effort is normal. No respiratory distress.     Breath sounds: Normal breath sounds. No stridor. No wheezing, rhonchi or rales.  Abdominal:     General: Bowel sounds are normal.     Palpations: Abdomen is soft.     Tenderness: There is no abdominal tenderness. There is no right CVA tenderness or left CVA tenderness.  Musculoskeletal:     Cervical back: Normal range of motion.  Lymphadenopathy:     Cervical: No cervical adenopathy.  Skin:     General: Skin is warm and dry.  Neurological:     General: No focal deficit present.     Mental Status: She is alert and oriented to person, place, and time.  Psychiatric:        Mood and Affect: Mood normal.        Behavior: Behavior normal.      UC Treatments / Results  Labs (all labs ordered are listed, but only abnormal results are displayed) Labs Reviewed  POCT URINALYSIS DIP (MANUAL ENTRY) - Abnormal; Notable for the following components:      Result Value   Clarity, UA cloudy (*)    Blood, UA moderate (*)    Protein Ur, POC =100 (*)    Leukocytes, UA Small (1+) (*)    All other components within normal limits  URINE CULTURE    EKG   Radiology No results found.  Procedures Procedures (including critical care time)  Medications Ordered in UC Medications - No data to display  Initial Impression / Assessment and Plan / UC Course  I have reviewed the triage vital signs and the nursing notes.  Pertinent labs & imaging results that were available during my care of the patient were reviewed by me and considered in my medical decision making (see chart for details).  Patient presents for complaints of urinary symptoms that started over the past 24 hours.  She complains of hematuria, dysuria, and low back pain.  Urinalysis is positive for leukocytes, protein,  and blood which is suggestive of a UTI.  Will start patient on Bactrim DS 800/160 mg tablets twice daily for the next 7 days while urine culture is pending.  Patient was advised if culture result is negative she will need to stop the antibiotic, also if culture is positive, culture will indicate if medication will cover bacteria causing symptoms.  Supportive care recommendations were provided and discussed with the patient to include fluids, over-the-counter analgesics, and avoiding caffeine.  Discussed indications with patient regarding follow-up.  Patient was in agreement with this plan of care and verbalizes  understanding.  All questions were answered.  Patient stable for discharge.  Final Clinical Impressions(s) / UC Diagnoses   Final diagnoses:  UTI symptoms  History of endometrial cancer     Discharge Instructions      -Your urinalysis shows that you may have a urinary tract infection.  A urine culture has been ordered.  If the results of the culture are negative, you will be contacted and advised to stop the antibiotic prescribed today likely better yellow yellow better no I love it.  If the result of the culture is positive, and the medication needs to be changed, you will also be contacted.  You will have access to your results via MyChart. -Take medications as prescribed. -Increase fluids. -Tylenol  for pain, fever, or general discomfort. -Develop a toileting schedule that will allow you to urinate at least every 2 hours. -Avoid caffeine to include tea, soda, and coffee. -If sexually active, void at least 15 to 20 minutes after sexual intercourse. -As discussed, if the results of the culture are negative and you are continue to experience symptoms, please follow-up with your oncologist for further evaluation. -Follow-up in the emergency department if you develop fever, chills, worsening abdominal pain, or other concerns. -Follow-up as needed.   ED Prescriptions     Medication Sig Dispense Auth. Provider   sulfamethoxazole-trimethoprim (BACTRIM DS) 800-160 MG tablet Take 1 tablet by mouth 2 (two) times daily for 7 days. 14 tablet Leath-Warren, Belen Bowers, NP      PDMP not reviewed this encounter.   Hardy Lia, NP 06/09/23 [phone]

## 2023-06-09 NOTE — Discharge Instructions (Addendum)
-  Your urinalysis shows that you may have a urinary tract infection.  A urine culture has been ordered.  If the results of the culture are negative, you will be contacted and advised to stop the antibiotic prescribed today likely better yellow yellow better no I love it.  If the result of the culture is positive, and the medication needs to be changed, you will also be contacted.  You will have access to your results via MyChart. -Take medications as prescribed. -Increase fluids. -Tylenol  for pain, fever, or general discomfort. -Develop a toileting schedule that will allow you to urinate at least every 2 hours. -Avoid caffeine to include tea, soda, and coffee. -If sexually active, void at least 15 to 20 minutes after sexual intercourse. -As discussed, if the results of the culture are negative and you are continue to experience symptoms, please follow-up with your oncologist for further evaluation. -Follow-up in the emergency department if you develop fever, chills, worsening abdominal pain, or other concerns. -Follow-up as needed.

## 2023-06-09 NOTE — ED Triage Notes (Signed)
 Urinary urgency, noticed blood in urine, lower back pain, burning on urination since yesterday

## 2023-06-10 ENCOUNTER — Ambulatory Visit (HOSPITAL_COMMUNITY)
Admission: RE | Admit: 2023-06-10 | Discharge: 2023-06-10 | Disposition: A | Discharge status: Home / Self Care | Source: Ambulatory Visit | Attending: Hematology | Admitting: Hematology

## 2023-06-10 DIAGNOSIS — C541 Malignant neoplasm of endometrium: Secondary | ICD-10-CM | POA: Diagnosis present

## 2023-06-10 MED ORDER — IOHEXOL 300 MG/ML  SOLN
75.0000 mL | Freq: Once | INTRAMUSCULAR | Status: AC | PRN
Start: 2023-06-10 — End: 2023-06-10
  Administered 2023-06-10: 75 mL via INTRAVENOUS

## 2023-06-12 ENCOUNTER — Ambulatory Visit: Payer: Self-pay

## 2023-06-12 LAB — URINE CULTURE: Culture: >=100000 — AB

## 2023-06-12 NOTE — Telephone Encounter (Signed)
 Copied from CRM [id number]. Topic: Clinical - Red Word Triage >> Jun 12, 2023  1:58 PM Jessica [name] wrote: Red Word that prompted transfer to Nurse Triage: Patient has E.coli from her uti, lower back pains and super tired. Been on antibiotics since seen at urgent care and treated for uti so she would like to do next. Answer Assessment - Initial Assessment Questions 1. REASON FOR CALL or QUESTION: "What is your reason for calling today?" or "How can I best help you?" or "What question do you have that I can help answer?"       Patient inquiring on E. Coli results in her Urine culture. Patient wanted to know if she needed to come back to the PCP office . Patient was recently seen at a local UC and her urine culture results came back. Pt denies new s/s, just seeking education.  Pt educated on what E. Coli is and the it's link to Urinary tract infections.  Pt instructed to continue to urgent care instructions for antibiotic.    Patient educated on pertinent s/s that would warrant emergent help/call 911/ ED/UC.  Patient verbalized understanding and agrees with plan No additional questions/concerns noted during the time of the call.  Protocols used: Information Only Call - No Triage-A-AH

## 2023-06-15 ENCOUNTER — Ambulatory Visit (HOSPITAL_COMMUNITY): Payer: Self-pay

## 2023-06-16 NOTE — Telephone Encounter (Signed)
 Patient advised.

## 2023-06-18 ENCOUNTER — Inpatient Hospital Stay (HOSPITAL_BASED_OUTPATIENT_CLINIC_OR_DEPARTMENT_OTHER): Admitting: Hematology

## 2023-06-18 VITALS — BP 124/82 | HR 77 | Temp 97.2°F | Resp 18 | Ht 64.0 in | Wt 224.0 lb

## 2023-06-18 DIAGNOSIS — C541 Malignant neoplasm of endometrium: Secondary | ICD-10-CM

## 2023-06-18 DIAGNOSIS — C775 Secondary and unspecified malignant neoplasm of intrapelvic lymph nodes: Secondary | ICD-10-CM | POA: Diagnosis not present

## 2023-06-18 NOTE — Progress Notes (Signed)
 [facility] [address] [place] [address]    Clinic Day:  06/18/2023  Referring physician: [name], MD  Patient Care Team: [name], MD as PCP - General (Internal Medicine) Mallipeddi, Kennyth Pean, MD as PCP - Cardiology (Cardiology) Wilson, [name], RN as Oncology Nurse Navigator Paulett Boros, MD as Medical Oncologist (Oncology)   ASSESSMENT & PLAN:   Assessment: 1.  Recurrent endometrial carcinosarcoma: -TAH, BSO, bilateral pelvic and para-aortic lymph node resections on 04/13/2018. -Pathology showed carcinosarcoma (malignant mixed mullerian tumor) is arising in an endometrial type polyp with no myometrial invasion identified.  High-grade.  0/39 lymph nodes positive.  PT1APN0, FIGO stage Ia.  MMR normal.  MSI-stable. -CTAP on 12/09/2018 for abdominal pain showed extensive peritoneal carcinomatosis and large soft tissue mass in the pelvis. -Biopsy of the omental mass on 12/28/2018 shows poorly differentiated carcinoma consistent with her prior malignancy. -Carboplatin  and paclitaxel  started on 12/29/2018. -CT scan on 05/02/2019 showed peritoneal implants in the low central small bowel mesentery and left pelvic sidewall have decreased in size measuring 1.5 x 1.9 cm and 1.7 x 2.2 cm.  Soft tissue nodule in the left lateral omentum measures 1 x 1.7 cm with no evidence of metastatic disease. -Continuation of chemotherapy until complete response was recommended. -CT AP on 07/04/2019 showed continued positive response to therapy with scattered peritoneal metastasis in the pelvis and left omentum decreased in size.  No new metastatic disease. -CTAP on 10/24/2019 after 14 cycles showed substantial reduction in size of soft tissue nodules in the pelvis.  1.4 x 0.9 x 1 cm soft tissue density in the inferior serosal margin of the sigmoid colon, previously 1.7 x 1.1 x 1.7 cm.  2 small soft tissue nodules in the mesenteric adipose tissue above the urinary  bladder measures 0.8 cm. -She has developed serious allergic reaction during cycle 14 with carboplatin . -I have discussed with Dr. Pearly Bound.  Other options include adding ifosfamide to Taxol  or Doxil and combination of lenvatinib with pembrolizumab.  In the lenvatinib pembrolizumab trial carcinosarcomas were not included. -Single agent paclitaxel  started on 11/09/2019, dose reduced by 20% on 01/18/2020.  Paclitaxel  held on 08/22/2021 as a chemo holiday. -CTAP on 01/10/2020 with stable small peritoneal soft tissue nodules in the pelvis.  Resolution of soft tissue nodularity in the left abdominal omental fat with no new or progressive disease.    Plan: 1.  Recurrent endometrial carcinosarcoma: - CTAP (04/05/2020): New 2.2 cm soft tissue density along the superior right lateral margin of the vaginal cuff suspicious for local recurrence.  Increased size of 11 mm lymph node, 7 mm previously. - [id number] (04/20/2023): MPL [id number].  No other targetable mutations. - Pathology (05/18/2023): Carcinosarcoma of the left external iliac lymph node biopsy. - We discussed CT chest (06/10/2023): No evidence of metastatic disease. - We have sent tissue for NGS testing.  There was not enough tissue.  We will try another vendor to see if they can complete the testing with minimal amount of tumor cells. - If not we will request our pathology to do HER2 IHC. - Treatment options include retreatment with paclitaxel  or Doxil.  Now on chemo options include lenvatinib/pembrolizumab/Enhertu/NTRK inhibitors based on NGS results. - She does not have any symptoms from early recurrence.  Hence I think it is okay for us  to wait until NGS is testing is done.  She is agreeable.  RTC 3 weeks for follow-up.   2.  Low back pain/right-sided abdominal pain: -  She had right THR on 09/19/2022.  Not requiring oxycodone  for the last few months.   3.  Hypertension: - Continue Norvasc  5 mg daily.  Blood pressure is normal.   4.  Neuropathy in the  legs: - Continue Lyrica  75 mg twice daily.  Neuropathy stable.   5.  Abnormal mammogram: - She has not had a mammogram since 2023.  Will consider ordering mammogram.  6.  Hypomagnesemia: - Continue magnesium  twice daily.  Last magnesium  was normal.    No orders of the defined types were placed in this encounter.     Jessica [name],acting as a Neurosurgeon for Paulett Boros, MD.,have documented all relevant documentation on the behalf of Paulett Boros, MD,as directed by  Paulett Boros, MD while in the presence of Paulett Boros, MD.  I, Paulett Boros MD, have reviewed the above documentation for accuracy and completeness, and I agree with the above.     Paulett Boros, MD   5/29/20254:37 PM  CHIEF COMPLAINT:   Diagnosis: recurrent endometrial cancer    Cancer Staging  No matching staging information was found for the patient.    Prior Therapy: 1. TAH & BSO, LNDs on 04/13/2018 2. Carboplatin /Paclitaxel , 12/29/18 - 10/05/19 3. Maintenance Paclitaxel , last dose on 08/22/2021  Current Therapy:  surveillance    HISTORY OF PRESENT ILLNESS:   Oncology History  Endometrial cancer (HCC)  03/31/2018 Initial Diagnosis   Endometrial cancer (HCC)   12/29/2018 -  Chemotherapy   Patient is on Treatment Plan : UTERINE Carboplatin  AUC 6 / Paclitaxel  q21d     06/06/2020 Miscellaneous   Summary of oncologic history from Dr. Demarian Epps   1.  Recurrent endometrial carcinosarcoma: -TAH, BSO, bilateral pelvic and para-aortic lymph node resections on 04/13/2018. -Pathology showed carcinosarcoma (malignant mixed mullerian tumor) is arising in an endometrial type polyp with no myometrial invasion identified.  High-grade.  0/39 lymph nodes positive.  PT1APN0, FIGO stage Ia.  MMR normal.  MSI-stable. -CTAP on 12/09/2018 for abdominal pain showed extensive peritoneal carcinomatosis and large soft tissue mass in the pelvis. -Biopsy of the omental mass on 12/28/2018  shows poorly differentiated carcinoma consistent with her prior malignancy. -Carboplatin  and paclitaxel  started on 12/29/2018. -CT scan on 05/02/2019 showed peritoneal implants in the low central small bowel mesentery and left pelvic sidewall have decreased in size measuring 1.5 x 1.9 cm and 1.7 x 2.2 cm.  Soft tissue nodule in the left lateral omentum measures 1 x 1.7 cm with no evidence of metastatic disease. -Continuation of chemotherapy until complete response was recommended. -CT AP on 07/04/2019 showed continued positive response to therapy with scattered peritoneal metastasis in the pelvis and left omentum decreased in size.  No new metastatic disease. -CTAP on 10/24/2019 after 14 cycles showed substantial reduction in size of soft tissue nodules in the pelvis.  1.4 x 0.9 x 1 cm soft tissue density in the inferior serosal margin of the sigmoid colon, previously 1.7 x 1.1 x 1.7 cm.  2 small soft tissue nodules in the mesenteric adipose tissue above the urinary bladder measures 0.8 cm. -She has developed serious allergic reaction during cycle 14 with carboplatin . -I have discussed with Dr. Pearly Bound.  Other options include adding ifosfamide to Taxol  or Doxil and combination of lenvatinib with pembrolizumab.  In the lenvatinib pembrolizumab trial carcinosarcomas were not included. -Single agent paclitaxel  started on 11/09/2019, dose reduced by 20% on 01/18/2020. -CTAP on 01/10/2020 with stable small peritoneal soft tissue nodules in the pelvis.  Resolution of soft tissue nodularity  in the left abdominal omental fat with no new or progressive disease.     06/20/2020 Imaging   1. Stable examination status post hysterectomy without suspicious enhancing soft tissue nodularity at the vaginal cuff and no significant change in the multiple small soft tissue nodules in the pelvis likely representing treated tumor. No new or progressive findings. 2. Cholelithiasis including a large gallstone in the neck of the  gallbladder but without CT evidence of acute cholecystitis. 3. Small hiatal hernia. 4. Aortic atherosclerosis.        INTERVAL HISTORY:   Jessica [name] is a 63 y.o. female presenting to clinic today for follow up of recurrent endometrial cancer. She was last seen by me on 05/25/23.  Since her last visit, she underwent CT chest on 06/10/23 that found: No evidence for metastatic disease in the chest. Wispy soft tissue density seen anterior mediastinum on the previous study has decreased in the interval, nearly resolved. Small hiatal hernia. Cholelithiasis. Aortic Atherosclerosis.  Today, she states that she is doing well overall. Her appetite level is at 100%. Her energy level is at 50%. She is accompanied by her daughter.   Talayia denies any new onset pain, though she does note intermittent discomfort along the left hip.   PAST MEDICAL HISTORY:   Past Medical History: Past Medical History:  Diagnosis Date   Allergy    Anemia    Anxiety    Arthritis    Cancer (HCC)    Phreesia 09/05/2019   Cancer of skin of back    Chest pain    Depression    Elevated blood pressure reading    Endometrial cancer (HCC)    Hyperlipidemia    Hypertension    PMB (postmenopausal bleeding)    Port-A-Cath in place 12/24/2018   Pre-diabetes     Surgical History: Past Surgical History:  Procedure Laterality Date   ABDOMINAL HYSTERECTOMY N/A    Phreesia 09/05/2019   IR IMAGING GUIDED PORT INSERTION  12/28/2018   IR RADIOLOGIST EVAL & MGMT  06/02/2018   IR RADIOLOGIST EVAL & MGMT  06/16/2018   NECK SURGERY  2000   spinal surgery , titanium rod in place    ROBOTIC ASSISTED TOTAL HYSTERECTOMY WITH BILATERAL SALPINGO OOPHERECTOMY N/A 04/13/2018   Procedure: XI ROBOTIC ASSISTED TOTAL HYSTERECTOMY WITH BILATERAL SALPINGO OOPHORECTOMY;  Surgeon: Alphonso Aschoff, MD;  Location: [facility];  Service: Gynecology;  Laterality: N/A;   ROBOTIC PELVIC AND PARA-AORTIC LYMPH NODE DISSECTION N/A 04/13/2018   Procedure: XI  ROBOTIC PELVIC LYMPHADECTOMY AND PARA-AORTIC LYMPH NODE DISSECTION;  Surgeon: Alphonso Aschoff, MD;  Location: [facility];  Service: Gynecology;  Laterality: N/A;   TOTAL HIP ARTHROPLASTY Right 09/19/2022   Procedure: RIGHT TOTAL HIP ARTHROPLASTY ANTERIOR APPROACH;  Surgeon: Arnie Lao, MD;  Location: [facility];  Service: Orthopedics;  Laterality: Right;    Social History: Social History   Socioeconomic History   Marital status: Widowed    Spouse name: Not on file   Number of children: 3   Years of education: Not on file   Highest education level: 12th grade  Occupational History   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  Tobacco Use   Smoking status: Never   Smokeless tobacco: Never  Vaping Use   Vaping status: Never Used  Substance and Sexual Activity   Alcohol use: Yes    Alcohol/week: 4.0 standard drinks of alcohol    Types: 4 Glasses of wine per week    Comment: weekends  Drug use: Never   Sexual activity: Not Currently  Other Topics Concern   Not on file  Social History Narrative      Wears seat belt    Does not use phone while driving    Smoke detectors at home   Fire extinguisher-no   No weapons   Social Drivers of Health   Financial Resource Strain: Low Risk  (04/15/2023)   Overall Financial Resource Strain (CARDIA)    Difficulty of Paying Living Expenses: Not very hard  Food Insecurity: No Food Insecurity (04/15/2023)   Hunger Vital Sign    Worried About Running Out of Food in the Last Year: Never true    Ran Out of Food in the Last Year: Never true  Transportation Needs: No Transportation Needs (04/15/2023)   PRAPARE - Administrator, Civil Service (Medical): No    Lack of Transportation (Non-Medical): No  Physical Activity: Sufficiently Active (04/15/2023)   Exercise Vital Sign    Days of Exercise per Week: 4 days    Minutes of Exercise per Session: 90 min  Stress: Stress Concern Present (04/15/2023)   [employer] of Occupational  Health - Occupational Stress Questionnaire    Feeling of Stress : To some extent  Social Connections: Socially Isolated (04/15/2023)   Social Connection and Isolation Panel [NHANES]    Frequency of Communication with Friends and Family: Three times a week    Frequency of Social Gatherings with Friends and Family: Once a week    Attends Religious Services: Never    Database administrator or Organizations: No    Attends Banker Meetings: Never    Marital Status: Widowed  Intimate Partner Violence: Not At Risk (04/15/2023)   Humiliation, Afraid, Rape, and Kick questionnaire    Fear of Current or Ex-Partner: No    Emotionally Abused: No    Physically Abused: No    Sexually Abused: No    Family History: Family History  Problem Relation Age of Onset   Hypertension Mother    Breast cancer Mother    Hypertension Father    Heart disease Father    Cancer Father        lung   Cancer Sister        ovarian cancer   Melanoma Sister     Current Medications:  Current Outpatient Medications:    amLODipine  (NORVASC ) 5 MG tablet, Take 1 tablet by mouth once daily, Disp: 90 tablet, Rfl: 0   atorvastatin  (LIPITOR) 20 MG tablet, Take 1 tablet by mouth once daily, Disp: 90 tablet, Rfl: 0   Cholecalciferol  (VITAMIN D3) 25 MCG (1000 UT) CAPS, Take 1 capsule (1,000 Units total) by mouth daily., Disp: 60 capsule, Rfl: 2   lidocaine -prilocaine  (EMLA ) cream, Apply to port site prior to use, Disp: 30 g, Rfl: 0   MAGNESIUM -OXIDE 400 (240 Mg) MG tablet, Take 1 tablet by mouth twice daily, Disp: 60 tablet, Rfl: 0   meclizine  (ANTIVERT ) 25 MG tablet, Take 1 tablet (25 mg total) by mouth 3 (three) times daily as needed for dizziness., Disp: 30 tablet, Rfl: 3   methocarbamol  (ROBAXIN ) 500 MG tablet, Take 1 tablet (500 mg total) by mouth every 6 (six) hours as needed for muscle spasms., Disp: 40 tablet, Rfl: 1   nitroGLYCERIN  (NITROSTAT ) 0.4 MG SL tablet, Place 1 tablet (0.4 mg total) under the  tongue every 5 (five) minutes as needed for chest pain., Disp: 25 tablet, Rfl: 3   PACLITAXEL  IV, Inject into  the vein every 21 ( twenty-one) days., Disp: , Rfl:    pregabalin  (LYRICA ) 75 MG capsule, TAKE 1 CAPSULE BY MOUTH THREE TIMES DAILY, Disp: 90 capsule, Rfl: 2   prochlorperazine  (COMPAZINE ) 10 MG tablet, Take 1 tablet (10 mg total) by mouth every 6 (six) hours as needed (Nausea or vomiting)., Disp: 60 tablet, Rfl: 3   sertraline  (ZOLOFT ) 100 MG tablet, Take 1 tablet by mouth once daily, Disp: 30 tablet, Rfl: 0 No current facility-administered medications for this visit.  Facility-Administered Medications Ordered in Other Visits:    diphenhydrAMINE  (BENADRYL ) 50 MG/ML injection, , , ,    palonosetron  (ALOXI ) 0.25 MG/5ML injection, , , ,    Allergies: Allergies  Allergen Reactions   Carboplatin  Other (See Comments)    "body on fire and abdominal pain"   Nickel Rash    itchy    REVIEW OF SYSTEMS:   Review of Systems  Constitutional:  Negative for chills, fatigue and fever.  HENT:   Negative for lump/mass, mouth sores, nosebleeds, sore throat and trouble swallowing.   Eyes:  Negative for eye problems.  Respiratory:  Negative for cough and shortness of breath.   Cardiovascular:  Negative for chest pain, leg swelling and palpitations.  Gastrointestinal:  Positive for diarrhea (occasional) and nausea (occasional). Negative for abdominal pain, constipation and vomiting.  Genitourinary:  Negative for bladder incontinence, difficulty urinating, dysuria, frequency, hematuria and nocturia.   Musculoskeletal:  Negative for arthralgias, back pain, flank pain, myalgias and neck pain.  Skin:  Negative for itching and rash.  Neurological:  Positive for dizziness. Negative for headaches and numbness.  Hematological:  Does not bruise/bleed easily.  Psychiatric/Behavioral:  Negative for depression, sleep disturbance and suicidal ideas. The patient is not nervous/anxious.   All other systems  reviewed and are negative.    VITALS:   Blood pressure 124/82, pulse 77, temperature (!) 97.2 F (36.2 C), temperature source Tympanic, resp. rate 18, height 5\' 4"  (1.626 m), weight 224 lb (101.6 kg), SpO2 96%.  Wt Readings from Last 3 Encounters:  06/18/23 224 lb (101.6 kg)  05/25/23 227 lb (103 kg)  05/18/23 225 lb (102.1 kg)    Body mass index is 38.45 kg/m.  Performance status (ECOG): 1 - Symptomatic but completely ambulatory  PHYSICAL EXAM:   Physical Exam Vitals and nursing note reviewed. Exam conducted with a chaperone present.  Constitutional:      Appearance: Normal appearance.  Cardiovascular:     Rate and Rhythm: Normal rate and regular rhythm.     Pulses: Normal pulses.     Heart sounds: Normal heart sounds.  Pulmonary:     Effort: Pulmonary effort is normal.     Breath sounds: Normal breath sounds.  Abdominal:     Palpations: Abdomen is soft. There is no hepatomegaly, splenomegaly or mass.     Tenderness: There is no abdominal tenderness.  Musculoskeletal:     Right lower leg: No edema.     Left lower leg: No edema.  Lymphadenopathy:     Cervical: No cervical adenopathy.     Right cervical: No superficial, deep or posterior cervical adenopathy.    Left cervical: No superficial, deep or posterior cervical adenopathy.     Upper Body:     Right upper body: No supraclavicular or axillary adenopathy.     Left upper body: No supraclavicular or axillary adenopathy.  Neurological:     General: No focal deficit present.     Mental Status: She is alert and oriented  to person, place, and time.  Psychiatric:        Mood and Affect: Mood normal.        Behavior: Behavior normal.     LABS:   CBC     Component Value Date/Time   WBC 7.0 05/18/2023 0824   RBC 4.88 05/18/2023 0824   HGB 13.1 05/18/2023 0824   HGB 14.3 08/07/2022 1249   HCT 40.6 05/18/2023 0824   HCT 43.3 08/07/2022 1249   PLT 293 05/18/2023 0824   PLT 252 08/07/2022 1249   MCV 83.2  05/18/2023 0824   MCV 83 08/07/2022 1249   MCH 26.8 05/18/2023 0824   MCHC 32.3 05/18/2023 0824   RDW 17.2 (H) 05/18/2023 0824   RDW 15.9 (H) 08/07/2022 1249   LYMPHSABS 2.6 05/18/2023 0824   LYMPHSABS 2.0 08/07/2022 1249   MONOABS 0.6 05/18/2023 0824   EOSABS 0.2 05/18/2023 0824   EOSABS 0.3 08/07/2022 1249   BASOSABS 0.1 05/18/2023 0824   BASOSABS 0.1 08/07/2022 1249    CMP      Component Value Date/Time   NA 140 05/18/2023 0824   NA 140 08/07/2022 1249   K 4.1 05/18/2023 0824   CL 107 05/18/2023 0824   CO2 22 05/18/2023 0824   GLUCOSE 115 (H) 05/18/2023 0824   BUN 17 05/18/2023 0824   BUN 14 08/07/2022 1249   CREATININE 0.71 05/18/2023 0824   CALCIUM  9.2 05/18/2023 0824   PROT 7.0 05/18/2023 0824   PROT 6.7 08/07/2022 1249   ALBUMIN 3.7 05/18/2023 0824   ALBUMIN 4.2 08/07/2022 1249   AST 27 05/18/2023 0824   ALT 22 05/18/2023 0824   ALKPHOS 105 05/18/2023 0824   BILITOT 0.4 05/18/2023 0824   BILITOT 0.2 08/07/2022 1249   GFRNONAA >60 05/18/2023 0824   GFRAA >60 10/05/2019 0825     No results found for: "CEA1", "CEA" / No results found for: "CEA1", "CEA" No results found for: "PSA1" No results found for: "ZOX096" Lab Results  Component Value Date   CAN125 6.3 04/06/2023    No results found for: "TOTALPROTELP", "ALBUMINELP", "A1GS", "A2GS", "BETS", "BETA2SER", "GAMS", "MSPIKE", "SPEI" No results found for: "TIBC", "FERRITIN", "IRONPCTSAT" Lab Results  Component Value Date   LDH 160 02/09/2019   LDH 331 (H) 12/22/2018     STUDIES:   CT CHEST W CONTRAST Result Date: 06/17/2023 CLINICAL DATA:  Ovarian/endometrial 03/26/2021. Cancer restaging. * Tracking Code: BO * . EXAM: CT CHEST WITH CONTRAST TECHNIQUE: Multidetector CT imaging of the chest was performed during intravenous contrast administration. RADIATION DOSE REDUCTION: This exam was performed according to the departmental dose-optimization program which includes automated exposure control, adjustment  of the mA and/or kV according to patient size and/or use of iterative reconstruction technique. CONTRAST:  75mL OMNIPAQUE  IOHEXOL  300 MG/ML  SOLN COMPARISON:  CT heart study 04/22/2022.  Chest CT 03/26/2021. FINDINGS: Cardiovascular: The heart size is normal. No substantial pericardial effusion. Coronary artery calcification is evident. Mild atherosclerotic calcification is noted in the wall of the thoracic aorta. Right Port-A-Cath tip is positioned in mid to distal SVC. Mediastinum/Nodes: No mediastinal lymphadenopathy. Wispy soft tissue density seen anterior mediastinum on the previous study has decreased in the interval, nearly resolved. There is no hilar lymphadenopathy. Small hiatal hernia. The esophagus has normal imaging features. There is no axillary lymphadenopathy. Lungs/Pleura: Tiny calcified granuloma noted right lower lobe. No suspicious pulmonary nodule or mass. No focal airspace consolidation. There is no evidence of pleural effusion. Upper Abdomen: Incompletely visualized gallstone measures up  to at least 16 mm diameter. The visualized portion of the upper abdomen is otherwise unremarkable. Musculoskeletal: No worrisome lytic or sclerotic osseous abnormality. IMPRESSION: 1. No evidence for metastatic disease in the chest. 2. Wispy soft tissue density seen anterior mediastinum on the previous study has decreased in the interval, nearly resolved. 3. Small hiatal hernia. 4. Cholelithiasis. 5.  Aortic Atherosclerosis (ICD10-I70.0). Electronically Signed   By: Donnal Fusi M.D.   On: 06/17/2023 09:12

## 2023-06-18 NOTE — Patient Instructions (Signed)
 [facility] at [facility] Discharge Instructions   You were seen and examined today by Dr. Cheree Cords.  The special testing we sent of your biopsy did not yield any results.   We will see you back in 3 weeks.   Return as scheduled.    Thank you for choosing [facility] at [facility] to provide your oncology and hematology care.  To afford each patient quality time with our provider, please arrive at least 15 minutes before your scheduled appointment time.   If you have a lab appointment with the Cancer Center please come in thru the Main Entrance and check in at the main information desk.  You need to re-schedule your appointment should you arrive 10 or more minutes late.  We strive to give you quality time with our providers, and arriving late affects you and other patients whose appointments are after yours.  Also, if you no show three or more times for appointments you may be dismissed from the clinic at the providers discretion.     Again, thank you for choosing [facility].  Our hope is that these requests will decrease the amount of time that you wait before being seen by our physicians.       _____________________________________________________________  Should you have questions after your visit to [facility], please contact our office at [phone] and follow the prompts.  Our office hours are 8:00 a.m. and 4:30 p.m. Monday - Friday.  Please note that voicemails left after 4:00 p.m. may not be returned until the following business day.  We are closed weekends and major holidays.  You do have access to a nurse 24-7, just call the main number to the clinic [phone] and do not press any options, hold on the line and a nurse will answer the phone.    For prescription refill requests, have your pharmacy contact our office and allow 72 hours.    Due to Covid, you will need to wear a mask upon entering the  hospital. If you do not have a mask, a mask will be given to you at the Main Entrance upon arrival. For doctor visits, patients may have 1 support person age 83 or older with them. For treatment visits, patients can not have anyone with them due to social distancing guidelines and our immunocompromised population.

## 2023-06-21 ENCOUNTER — Other Ambulatory Visit: Payer: Self-pay | Admitting: Hematology

## 2023-06-22 ENCOUNTER — Encounter: Payer: Self-pay | Admitting: Hematology

## 2023-06-26 ENCOUNTER — Ambulatory Visit: Payer: Self-pay

## 2023-06-26 NOTE — Telephone Encounter (Signed)
 FYI Only or Action Required?: FYI only for provider  Patient was last seen in primary care on 01/08/2023 by [name], MD. Called Nurse Triage reporting Knee Pain. Symptoms began several weeks ago. Interventions attempted: OTC medications: ibuprofen /Tylenol , Rest, hydration, or home remedies, and Ice/heat application. Symptoms are: gradually worsening.  Triage Disposition: See PCP When Office is Open (Within 3 Days)  Patient/caregiver understands and will follow disposition?: Yes                                 Copied From CRM [id number]. Reason for Triage: Pt is experiencing knee pain, not severe but painful. Swelling  Best contact: [phone]  Reason for Disposition  [1] MODERATE pain (e.g., interferes with normal activities, limping) AND [2] present > 3 days  Answer Assessment - Initial Assessment Questions 1. LOCATION and RADIATION: "Where is the pain located?"      Right knee, pain is located on kneecap, states she feels "pulling" behind knee when walking 2. QUALITY: "What does the pain feel like?"  (e.g., sharp, dull, aching, burning)     "Stabbing pain" comes and goes, dull and achy constant  3. SEVERITY: "How bad is the pain?" "What does it keep you from doing?"   (Scale 1-10; or mild, moderate, severe)   -  MILD (1-3): doesn't interfere with normal activities    -  MODERATE (4-7): interferes with normal activities (e.g., work or school) or awakens from sleep, limping    -  SEVERE (8-10): excruciating pain, unable to do any normal activities, unable to walk     Rates pain a 5 at this time, states she is having to limp 4. ONSET: "When did the pain start?" "Does it come and go, or is it there all the time?"     1-2 weeks ago, states knee felt better over the course of the week, but worsened again this morning 6. SETTING: "Has there been any recent work, exercise or other activity that involved that part of the body?"      Cut her grass 1-2 weeks  ago, states yard has a big dip and she may have twisted her leg, states she rides the bike at [employer], states riding the bike doesn't hurt  7. AGGRAVATING FACTORS: "What makes the knee pain worse?" (e.g., walking, climbing stairs, running)     Walking 8. ASSOCIATED SYMPTOMS: "Is there any swelling or redness of the knee?"     Mild swelling, denies redness  9. OTHER SYMPTOMS: "Do you have any other symptoms?" (e.g., chest pain, difficulty breathing, fever, calf pain)     Denies chest pain, denies difficulty breathing, denies calf pain, denies fever  Ice, ibuprofen /Tylenol  and rest  Advised patient to see provider within 3 days. No availability in office. Advised UC. Patient declined UC at this time.  Protocols used: Knee Pain-A-AH

## 2023-06-26 NOTE — Telephone Encounter (Signed)
 Has appt 6/17. If she changes her mind she can schedule an acute appt with available provider

## 2023-06-28 ENCOUNTER — Other Ambulatory Visit: Payer: Self-pay | Admitting: Internal Medicine

## 2023-06-28 DIAGNOSIS — I10 Essential (primary) hypertension: Secondary | ICD-10-CM

## 2023-06-28 DIAGNOSIS — E785 Hyperlipidemia, unspecified: Secondary | ICD-10-CM

## 2023-07-03 ENCOUNTER — Other Ambulatory Visit: Payer: Self-pay | Admitting: Internal Medicine

## 2023-07-03 DIAGNOSIS — F321 Major depressive disorder, single episode, moderate: Secondary | ICD-10-CM

## 2023-07-03 DIAGNOSIS — F419 Anxiety disorder, unspecified: Secondary | ICD-10-CM

## 2023-07-07 ENCOUNTER — Ambulatory Visit (INDEPENDENT_AMBULATORY_CARE_PROVIDER_SITE_OTHER): Payer: 59 | Admitting: Internal Medicine

## 2023-07-07 ENCOUNTER — Encounter: Payer: Self-pay | Admitting: Internal Medicine

## 2023-07-07 VITALS — BP 133/79 | HR 78 | Ht 64.0 in | Wt 228.6 lb

## 2023-07-07 DIAGNOSIS — R7303 Prediabetes: Secondary | ICD-10-CM

## 2023-07-07 DIAGNOSIS — I10 Essential (primary) hypertension: Secondary | ICD-10-CM | POA: Diagnosis not present

## 2023-07-07 DIAGNOSIS — Z1231 Encounter for screening mammogram for malignant neoplasm of breast: Secondary | ICD-10-CM | POA: Diagnosis not present

## 2023-07-07 DIAGNOSIS — E669 Obesity, unspecified: Secondary | ICD-10-CM | POA: Insufficient documentation

## 2023-07-07 DIAGNOSIS — E785 Hyperlipidemia, unspecified: Secondary | ICD-10-CM

## 2023-07-07 DIAGNOSIS — H8110 Benign paroxysmal vertigo, unspecified ear: Secondary | ICD-10-CM

## 2023-07-07 DIAGNOSIS — F321 Major depressive disorder, single episode, moderate: Secondary | ICD-10-CM

## 2023-07-07 DIAGNOSIS — C541 Malignant neoplasm of endometrium: Secondary | ICD-10-CM

## 2023-07-07 NOTE — Assessment & Plan Note (Signed)
 A1c 6.3 on labs from June 2024.  She has focused on increasing her exercise capacity and improving dietary habits in an effort to lose weight.  Repeat A1c ordered today.

## 2023-07-07 NOTE — Assessment & Plan Note (Signed)
 Adequately controlled on current antihypertensive regimen.

## 2023-07-07 NOTE — Patient Instructions (Signed)
 It was a pleasure to see you today.  Thank you for giving [place] the opportunity to be involved in your care.  Below is a brief recap of your visit and next steps.  We will plan to see you again in 6 months.  Summary No medication changes today Repeat labs ordered Follow up in 6 months

## 2023-07-07 NOTE — Assessment & Plan Note (Signed)
 Symptoms are adequately controlled with as needed use of meclizine .

## 2023-07-07 NOTE — Assessment & Plan Note (Signed)
She is currently prescribed atorvastatin 20 mg daily. -Repeat lipid panel ordered today 

## 2023-07-07 NOTE — Progress Notes (Signed)
 Established Patient Office Visit  Subjective   Patient ID: Jessica [name], female    DOB: 1961/01/04  Age: 63 y.o. MRN: [id number]  Chief Complaint  Patient presents with   Care Management    Six month follow up   Jessica [name] returns to care today for routine follow-up.  She was last evaluated by me in December 2024.  No medication changes were made at that time and 23-month follow-up was arranged for reassessment.  In the interim, she has been evaluated by oncology for follow-up of recurrent endometrial carcinosarcoma.  She underwent left external iliac lymph node biopsy 4/28.  Pathology consistent with carcinosarcoma.  Last evaluated by oncology 5/29.  She will return to care for follow-up next week (6/23) to discuss treatment options.  Urgent care presentation 5/20 endorsing hematuria.  Empirically treated for UTI with Bactrim .  There have otherwise been no acute interval events.  Today she reports feeling well and has no acute concerns to discuss.  Past Medical History:  Diagnosis Date   Allergy    Anemia    Anxiety    Arthritis    Cancer (HCC)    [facility]rees[employer] 09/05/2019   Cancer of skin of back    Chest pain    Depression    Elevated blood pressure reading    Endometrial cancer (HCC)    Hyperlipidemia    Hypertension    PMB (postmenopausal bleeding)    Port-A-Cath in place 12/24/2018   Pre-diabetes    Past Surgical History:  Procedure Laterality Date   ABDOMINAL HYSTERECTOMY N/A    [employer] 09/05/2019   IR IMAGING GUIDED PORT INSERTION  12/28/2018   IR RADIOLOGIST EVAL & MGMT  06/02/2018   IR RADIOLOGIST EVAL & MGMT  06/16/2018   NECK SURGERY  2000   spinal surgery , titanium rod in place    ROBOTIC ASSISTED TOTAL HYSTERECTOMY WITH BILATERAL SALPINGO OOPHERECTOMY N/A 04/13/2018   Procedure: XI ROBOTIC ASSISTED TOTAL HYSTERECTOMY WITH BILATERAL SALPINGO OOPHORECTOMY;  Surgeon: Alphonso Aschoff, MD;  Location: [facility];  Service: Gynecology;  Laterality: N/A;   ROBOTIC PELVIC  AND PARA-AORTIC LYMPH NODE DISSECTION N/A 04/13/2018   Procedure: XI ROBOTIC PELVIC LYMPHADECTOMY AND PARA-AORTIC LYMPH NODE DISSECTION;  Surgeon: Alphonso Aschoff, MD;  Location: [facility];  Service: Gynecology;  Laterality: N/A;   TOTAL HIP ARTHROPLASTY Right 09/19/2022   Procedure: RIGHT TOTAL HIP ARTHROPLASTY ANTERIOR APPROACH;  Surgeon: Arnie Lao, MD;  Location: [facility];  Service: Orthopedics;  Laterality: Right;   Social History   Tobacco Use   Smoking status: Never   Smokeless tobacco: Never  Vaping Use   Vaping status: Never Used  Substance Use Topics   Alcohol use: Yes    Alcohol/week: 4.0 standard drinks of alcohol    Types: 4 Glasses of wine per week    Comment: weekends   Drug use: Never   Family History  Problem Relation Age of Onset   Hypertension Mother    Breast cancer Mother    Hypertension Father    Heart disease Father    Cancer Father        lung   Cancer Sister        ovarian cancer   Melanoma Sister    Allergies  Allergen Reactions   Carboplatin  Other (See Comments)    body on fire and abdominal pain   Nickel Rash    itchy   Review of Systems  Constitutional:  Negative for chills and fever.  HENT:  Negative  for sore throat.   Respiratory:  Negative for cough and shortness of breath.   Cardiovascular:  Negative for chest pain, palpitations and leg swelling.  Gastrointestinal:  Negative for abdominal pain, blood in stool, constipation, diarrhea, nausea and vomiting.  Genitourinary:  Negative for dysuria and hematuria.  Musculoskeletal:  Negative for myalgias.  Skin:  Negative for itching and rash.  Neurological:  Negative for dizziness and headaches.  Psychiatric/Behavioral:  Negative for depression and suicidal ideas.      Objective:     BP 133/79   Pulse 78   Ht 5' 4 (1.626 m)   Wt 228 lb 9.6 oz (103.7 kg)   SpO2 95%   BMI 39.24 kg/m  BP Readings from Last 3 Encounters:  07/07/23 133/79  06/18/23 124/82  06/09/23 127/79    Physical Exam Vitals reviewed.  Constitutional:      General: She is not in acute distress.    Appearance: Normal appearance. She is obese. She is not toxic-appearing.  HENT:     Head: Normocephalic and atraumatic.     Right Ear: External ear normal.     Left Ear: External ear normal.     Nose: Nose normal. No congestion or rhinorrhea.     Mouth/Throat:     Mouth: Mucous membranes are moist.     Pharynx: Oropharynx is clear. No oropharyngeal exudate or posterior oropharyngeal erythema.   Eyes:     General: No scleral icterus.    Extraocular Movements: Extraocular movements intact.     Conjunctiva/sclera: Conjunctivae normal.     Pupils: Pupils are equal, round, and reactive to light.    Cardiovascular:     Rate and Rhythm: Normal rate and regular rhythm.     Pulses: Normal pulses.     Heart sounds: Normal heart sounds. No murmur heard.    No friction rub. No gallop.  Pulmonary:     Effort: Pulmonary effort is normal.     Breath sounds: Normal breath sounds. No wheezing, rhonchi or rales.  Abdominal:     General: Abdomen is flat. Bowel sounds are normal. There is no distension.     Palpations: Abdomen is soft.     Tenderness: There is no abdominal tenderness.   Musculoskeletal:        General: No swelling. Normal range of motion.     Cervical back: Normal range of motion.     Right lower leg: No edema.     Left lower leg: No edema.  Lymphadenopathy:     Cervical: No cervical adenopathy.   Skin:    General: Skin is warm and dry.     Capillary Refill: Capillary refill takes less than 2 seconds.     Coloration: Skin is not jaundiced.   Neurological:     General: No focal deficit present.     Mental Status: She is alert and oriented to person, place, and time.   Psychiatric:        Mood and Affect: Mood normal.        Behavior: Behavior normal.   Last CBC Lab Results  Component Value Date   WBC 7.0 05/18/2023   HGB 13.1 05/18/2023   HCT 40.6 05/18/2023    MCV 83.2 05/18/2023   MCH 26.8 05/18/2023   RDW 17.2 (H) 05/18/2023   PLT 293 05/18/2023   Last metabolic panel Lab Results  Component Value Date   GLUCOSE 115 (H) 05/18/2023   NA 140 05/18/2023   K 4.1 05/18/2023  CL 107 05/18/2023   CO2 22 05/18/2023   BUN 17 05/18/2023   CREATININE 0.71 05/18/2023   GFRNONAA >60 05/18/2023   CALCIUM  9.2 05/18/2023   PROT 7.0 05/18/2023   ALBUMIN 3.7 05/18/2023   LABGLOB 2.5 08/07/2022   AGRATIO 1.9 04/26/2018   BILITOT 0.4 05/18/2023   ALKPHOS 105 05/18/2023   AST 27 05/18/2023   ALT 22 05/18/2023   ANIONGAP 11 05/18/2023   Last lipids Lab Results  Component Value Date   CHOL 213 (H) 07/09/2022   HDL 107 07/09/2022   LDLCALC 92 07/09/2022   TRIG 78 07/09/2022   CHOLHDL 2.0 07/09/2022   Last hemoglobin A1c Lab Results  Component Value Date   HGBA1C 6.3 (H) 07/09/2022   Last thyroid  functions Lab Results  Component Value Date   TSH 0.907 07/09/2022   T4TOTAL 7.9 04/26/2018   Last vitamin D  Lab Results  Component Value Date   VD25OH 38.5 07/09/2022   Last vitamin B12 and Folate Lab Results  Component Value Date   VITAMINB12 632 07/09/2022   FOLATE 9.3 07/09/2022     Assessment & Plan:   Problem List Items Addressed This Visit       Essential hypertension (Chronic)   Adequately controlled on current antihypertensive regimen.      BPPV (benign paroxysmal positional vertigo)   Symptoms are adequately controlled with as needed use of meclizine .      Endometrial cancer (HCC)   Closely followed by oncology in the setting of recurrent endometrial carcinosarcoma.  She has recently completed a biopsy of a left external iliac lymph node with pathology consistent with carcinosarcoma.  Tissue has been sent for NGS.  She will follow-up with oncology 6/23 to discuss treatment options.      Hyperlipidemia (Chronic)   She is currently prescribed atorvastatin  20 mg daily.  Repeat lipid panel ordered today.       Depression, major, single episode, moderate (HCC)   Mood remains stable with sertraline  100 mg daily.      Prediabetes   A1c 6.3 on labs from June 2024.  She has focused on increasing her exercise capacity and improving dietary habits in an effort to lose weight.  Repeat A1c ordered today.      Obesity (BMI 30-39.9)   Current weight 228 pounds.  BMI 39.2.  She has focused on lifestyle changes in an effort to lose weight, namely improving her exercise regularity and frequency.  She continues to work on dietary habits, admitting that she continues to snack late at night.  Repeat labs ordered today.  We again discussed the importance of healthy dieting habits in an effort to lose weight.      Breast cancer screening by mammogram - Primary   Screening mammogram ordered today      Return in about 6 months (around 01/06/2024).   [name], MD

## 2023-07-07 NOTE — Assessment & Plan Note (Signed)
 Current weight 228 pounds.  BMI 39.2.  She has focused on lifestyle changes in an effort to lose weight, namely improving her exercise regularity and frequency.  She continues to work on dietary habits, admitting that she continues to snack late at night.  Repeat labs ordered today.  We again discussed the importance of healthy dieting habits in an effort to lose weight.

## 2023-07-07 NOTE — Assessment & Plan Note (Signed)
 Mood remains stable with sertraline 100 mg daily.

## 2023-07-07 NOTE — Assessment & Plan Note (Signed)
 Closely followed by oncology in the setting of recurrent endometrial carcinosarcoma.  She has recently completed a biopsy of a left external iliac lymph node with pathology consistent with carcinosarcoma.  Tissue has been sent for NGS.  She will follow-up with oncology 6/23 to discuss treatment options.

## 2023-07-07 NOTE — Assessment & Plan Note (Signed)
 Screening mammogram ordered today

## 2023-07-08 ENCOUNTER — Ambulatory Visit: Payer: Self-pay | Admitting: Internal Medicine

## 2023-07-08 ENCOUNTER — Encounter: Payer: Self-pay | Admitting: Hematology

## 2023-07-08 LAB — CMP14+EGFR
ALT: 20 IU/L (ref 0–32)
AST: 23 IU/L (ref 0–40)
Albumin: 4.4 g/dL (ref 3.8–4.9)
Alkaline Phosphatase: 145 IU/L — ABNORMAL HIGH (ref 44–121)
BUN/Creatinine Ratio: 18 (ref 12–28)
BUN: 14 mg/dL (ref 8–27)
Bilirubin Total: 0.3 mg/dL (ref 0.0–1.2)
CO2: 22 mmol/L (ref 20–29)
Calcium: 10 mg/dL (ref 8.7–10.3)
Chloride: 105 mmol/L (ref 96–106)
Creatinine, Ser: 0.78 mg/dL (ref 0.57–1.00)
Globulin, Total: 2.6 g/dL (ref 1.5–4.5)
Glucose: 104 mg/dL — ABNORMAL HIGH (ref 70–99)
Potassium: 4.8 mmol/L (ref 3.5–5.2)
Sodium: 145 mmol/L — ABNORMAL HIGH (ref 134–144)
Total Protein: 7 g/dL (ref 6.0–8.5)
eGFR: 87 mL/min/{1.73_m2} (ref >59)

## 2023-07-08 LAB — CBC WITH DIFFERENTIAL/PLATELET
Basophils Absolute: 0.1 10*3/uL (ref 0.0–0.2)
Basos: 1 %
EOS (ABSOLUTE): 0.2 10*3/uL (ref 0.0–0.4)
Eos: 3 %
Hematocrit: 42.7 % (ref 34.0–46.6)
Hemoglobin: 13.3 g/dL (ref 11.1–15.9)
Immature Grans (Abs): 0 10*3/uL (ref 0.0–0.1)
Immature Granulocytes: 0 %
Lymphocytes Absolute: 2.6 10*3/uL (ref 0.7–3.1)
Lymphs: 36 %
MCH: 26.5 pg — ABNORMAL LOW (ref 26.6–33.0)
MCHC: 31.1 g/dL — ABNORMAL LOW (ref 31.5–35.7)
MCV: 85 fL (ref 79–97)
Monocytes Absolute: 0.6 10*3/uL (ref 0.1–0.9)
Monocytes: 8 %
Neutrophils Absolute: 3.7 10*3/uL (ref 1.4–7.0)
Neutrophils: 52 %
Platelets: 305 10*3/uL (ref 150–450)
RBC: 5.01 x10E6/uL (ref 3.77–5.28)
RDW: 15.3 % (ref 11.7–15.4)
WBC: 7.2 10*3/uL (ref 3.4–10.8)

## 2023-07-08 LAB — LIPID PANEL
Chol/HDL Ratio: 2.2 ratio (ref 0.0–4.4)
Cholesterol, Total: 201 mg/dL — ABNORMAL HIGH (ref 100–199)
HDL: 92 mg/dL (ref >39)
LDL Chol Calc (NIH): 99 mg/dL (ref 0–99)
Triglycerides: 53 mg/dL (ref 0–149)
VLDL Cholesterol Cal: 10 mg/dL (ref 5–40)

## 2023-07-08 LAB — B12 AND FOLATE PANEL
Folate: >20 ng/mL (ref >3.0)
Vitamin B-12: 864 pg/mL (ref 232–1245)

## 2023-07-08 LAB — TSH+FREE T4
Free T4: 1.03 ng/dL (ref 0.82–1.77)
TSH: 4.93 u[IU]/mL — ABNORMAL HIGH (ref 0.450–4.500)

## 2023-07-08 LAB — HEMOGLOBIN A1C
Est. average glucose Bld gHb Est-mCnc: 131 mg/dL
Hgb A1c MFr Bld: 6.2 % — ABNORMAL HIGH (ref 4.8–5.6)

## 2023-07-08 LAB — VITAMIN D 25 HYDROXY (VIT D DEFICIENCY, FRACTURES): Vit D, 25-Hydroxy: 45.7 ng/mL (ref 30.0–100.0)

## 2023-07-08 NOTE — Progress Notes (Addendum)
 Cardiology Office Note    Date:  07/09/2023  ID:  [name], DOB 08/11/1960, MRN [id number] Cardiologist: Vishnu P Mallipeddi, MD    History of Present Illness:    Jessica [name] is a 63 y.o. female with past medical history of HTN, HLD and endometrial cancer who presents to the office today for 1 year follow-up.  She was last examined by Dr. Mallipeddi  in 03/2022 as a new patient referral for episodes of chest pain. She reported having an episode of chest discomfort several months prior which lasted for a few minutes and then resolved. Had previously undergone chemotherapy for endometrial cancer at that time and did report associated pain along her legs which was felt to be due to her chemotherapy. A Lexiscan  Myoview  was recommended for further assessment and if chest heaviness increased in frequency or severity, would start Lopressor  25 mg twice daily and obtain an echocardiogram. Her NST showed a small, mild intensity anterior apical defect which was partially reversible and most consistent with soft tissue attenuation but a very small ischemic territory could not be excluded. Was read as low-risk but for further investigation, a Cardiac CTA was also recommended.This was obtained in 04/2022 and showed minimal, nonobstructive CAD with 0 to 24% stenosis and risk factor modification was recommended.  In talking with the patient today, she reports overall doing well from a cardiac perspective since her last office visit. She has started going to the [employer] at least 3 to 4 days a week and has been riding an exercise bike for up to 10 miles and using the weight machines. She denies any chest pain or dyspnea on exertion with this. No specific orthopnea, PND, pitting edema or palpitations. She was recently diagnosed with recurrent endometrial cancer and is being followed closely by Oncology to decide on the next treatment steps. She has follow-up next week for further discussion of this.  Studies  Reviewed:   EKG: EKG is ordered today and demonstrates:   EKG Interpretation Date/Time:  Thursday July 09 2023 10:22:23 EDT Ventricular Rate:  63 PR Interval:  170 QRS Duration:  86 QT Interval:  408 QTC Calculation: 417 R Axis:   -9  Text Interpretation: Normal sinus rhythm Baseline artifact Confirmed by Jessica [name] ([id number]) on 07/09/2023 10:40:08 AM       NST: 03/2022   Findings are equivocal. The study is low risk.   No ST deviation was noted.   LV perfusion is equivocal.  Small, mild intensity, anteroapical defect that is partially reversible and most consistent with variable soft tissue attenuation, cannot completely exclude a very small ischemic territory.  Calculated TID ratio does not look to be accurate with re-registration of images.   Left ventricular function is normal. Nuclear stress EF: 67 %.   Overall low risk study with suspected variable soft tissue attenuation affecting the LV apex and LVEF 67%.  Coronary CTA: 04/2022 Coronary Arteries:  Normal coronary origin.  Right dominance.   RCA is a large dominant artery that gives rise to PDA and PLA. There is small focal calcified plaque, 0-24% stenosis.   Left main is a large artery that gives rise to LAD and LCX arteries. There is distal left main calcified plaque, 0-24% stenosis.   LAD is a large vessel that has no plaque.   D1: Moderate sized no plaque   D2: Moderate sized no plaque   D3: Small caliber no plaque   LCX is a non-dominant artery that gives rise to  one large OM1 branch. There is no plaque. Tortuous vessel.   Other findings:   Normal pulmonary vein drainage into the left atrium.   Normal left atrial appendage without a thrombus.   Normal size of the pulmonary artery.   Catheter in SVC.   Please see radiology report for non cardiac findings.   IMPRESSION: 1. Coronary calcium  score of 65. This was 90 percentile for age and sex matched control.   2. Normal coronary origin with  right dominance.   3. There is distal left main calcified plaque, 0-24% stenosis. There is small RCA calcified plaque, 0-24% stenosis.   CAD-RADS 1. Minimal non-obstructive CAD (0-24%). Consider non-atherosclerotic causes of chest pain. Consider preventive therapy and risk factor modification.   Physical Exam:   VS:  BP 130/80 (BP Location: Left Arm, Patient Position: Sitting, Cuff Size: Large)   Pulse 64   Ht 5' 4 (1.626 m)   Wt 228 lb 12.8 oz (103.8 kg)   SpO2 97%   BMI 39.27 kg/m    Wt Readings from Last 3 Encounters:  07/09/23 228 lb 12.8 oz (103.8 kg)  07/07/23 228 lb 9.6 oz (103.7 kg)  06/18/23 224 lb (101.6 kg)     GEN: Well nourished, well developed female appearing in no acute distress NECK: No JVD; No carotid bruits CARDIAC: RRR, no murmurs, rubs, gallops RESPIRATORY:  Clear to auscultation without rales, wheezing or rhonchi  ABDOMEN: Appears non-distended. No obvious abdominal masses. EXTREMITIES: No clubbing or cyanosis. No pitting edema.  Distal pedal pulses are 2+ bilaterally.   Assessment and Plan:   1. Coronary artery calcification seen on CT scan - Coronary CTA in 04/2022 showed minimal, nonobstructive CAD with less than 0 to 24% stenosis. EKG today without acute ST changes. She has significantly increased her activity level and denies any specific anginal symptoms.  - Continue to focus on risk factor modification. She is on statin therapy as discussed below.  2. Essential hypertension - BP is well-controlled at 130/80 during today's visit. Continue current medical therapy with Amlodipine  5 mg daily.  3. Hyperlipidemia LDL goal <70 - FLP earlier this week showed total cholesterol 201, triglycerides 53, HDL 92 and LDL 99. She is currently on Atorvastatin  20 mg daily and has preferred to focus on dietary changes and exercise. If LDL remains above goal over time, could further titrate Atorvastatin  to 40 mg daily.  Disposition: She prefers to follow-up with  Cardiology on an as-needed basis going forward.  Signed, Dorma Gash, PA-C

## 2023-07-09 ENCOUNTER — Encounter: Payer: Self-pay | Admitting: Student

## 2023-07-09 ENCOUNTER — Ambulatory Visit: Attending: Student | Admitting: Student

## 2023-07-09 VITALS — BP 130/80 | HR 64 | Ht 64.0 in | Wt 228.8 lb

## 2023-07-09 DIAGNOSIS — I2510 Atherosclerotic heart disease of native coronary artery without angina pectoris: Secondary | ICD-10-CM

## 2023-07-09 DIAGNOSIS — I10 Essential (primary) hypertension: Secondary | ICD-10-CM

## 2023-07-09 DIAGNOSIS — E785 Hyperlipidemia, unspecified: Secondary | ICD-10-CM | POA: Diagnosis not present

## 2023-07-09 NOTE — Patient Instructions (Addendum)
 Medication Instructions:  Your physician recommends that you continue on your current medications as directed. Please refer to the Current Medication list given to you today.  *If you need a refill on your cardiac medications before your next appointment, please call your pharmacy*  Lab Work: NONE   If you have labs (blood work) drawn today and your tests are completely normal, you will receive your results only by: MyChart Message (if you have MyChart) OR A paper copy in the mail If you have any lab test that is abnormal or we need to change your treatment, we will call you to review the results.  Testing/Procedures: NONE   Follow-Up: At [facility], you and your health needs are our priority.  As part of our continuing mission to provide you with exceptional heart care, our providers are all part of one team.  This team includes your primary Cardiologist (physician) and Advanced Practice Providers or APPs (Physician Assistants and Nurse Practitioners) who all work together to provide you with the care you need, when you need it.  Your next appointment:    As Needed   Provider:   You may see Vishnu P Mallipeddi, MD or one of the following Advanced Practice Providers on your designated Care Team:   [place], PA-C  [place], [place] Theotis Flake, [place]     We recommend signing up for the patient portal called MyChart.  Sign up information is provided on this After Visit Summary.  MyChart is used to connect with patients for Virtual Visits (Telemedicine).  Patients are able to view lab/test results, encounter notes, upcoming appointments, etc.  Non-urgent messages can be sent to your provider as well.   To learn more about what you can do with MyChart, go to [email or web address].   Other Instructions Thank you for choosing [facility]!

## 2023-07-13 ENCOUNTER — Inpatient Hospital Stay: Admitting: Hematology

## 2023-07-13 ENCOUNTER — Ambulatory Visit (HOSPITAL_COMMUNITY)
Admission: RE | Admit: 2023-07-13 | Discharge: 2023-07-13 | Disposition: A | Discharge status: Home / Self Care | Source: Ambulatory Visit | Attending: Internal Medicine | Admitting: Internal Medicine

## 2023-07-13 DIAGNOSIS — Z1231 Encounter for screening mammogram for malignant neoplasm of breast: Secondary | ICD-10-CM | POA: Diagnosis present

## 2023-07-13 NOTE — Progress Notes (Incomplete)
 [facility] [address] [place] [address]    Clinic Day:  07/13/2023  Referring physician: Melvenia Manus BRAVO, MD  Patient Care Team: Jessica Doffing, FNP as PCP - General (Family Medicine) Mallipeddi, Jessica SQUIBB, MD as PCP - Cardiology (Cardiology) Wilson, Jessica [name] as Oncology Nurse Navigator Jessica Hai, MD as Medical Oncologist (Oncology)   ASSESSMENT & PLAN:   Assessment: 1.  Recurrent endometrial carcinosarcoma: -TAH, BSO, bilateral pelvic and para-aortic lymph node resections on 04/13/2018. -Pathology showed carcinosarcoma (malignant mixed mullerian tumor) is arising in an endometrial type polyp with no myometrial invasion identified.  High-grade.  0/39 lymph nodes positive.  PT1APN0, FIGO stage Ia.  MMR normal.  MSI-stable. -CTAP on 12/09/2018 for abdominal pain showed extensive peritoneal carcinomatosis and large soft tissue mass in the pelvis. -Biopsy of the omental mass on 12/28/2018 shows poorly differentiated carcinoma consistent with her prior malignancy. -Carboplatin  and paclitaxel  started on 12/29/2018. -CT scan on 05/02/2019 showed peritoneal implants in the low central small bowel mesentery and left pelvic sidewall have decreased in size measuring 1.5 x 1.9 cm and 1.7 x 2.2 cm.  Soft tissue nodule in the left lateral omentum measures 1 x 1.7 cm with no evidence of metastatic disease. -Continuation of chemotherapy until complete response was recommended. -CT AP on 07/04/2019 showed continued positive response to therapy with scattered peritoneal metastasis in the pelvis and left omentum decreased in size.  No new metastatic disease. -CTAP on 10/24/2019 after 14 cycles showed substantial reduction in size of soft tissue nodules in the pelvis.  1.4 x 0.9 x 1 cm soft tissue density in the inferior serosal margin of the sigmoid colon, previously 1.7 x 1.1 x 1.7 cm.  2 small soft tissue nodules in the mesenteric adipose tissue above the urinary bladder  measures 0.8 cm. -She has developed serious allergic reaction during cycle 14 with carboplatin . -I have discussed with Dr. Eloy.  Other options include adding ifosfamide to Taxol  or Doxil and combination of lenvatinib with pembrolizumab.  In the lenvatinib pembrolizumab trial carcinosarcomas were not included. -Single agent paclitaxel  started on 11/09/2019, dose reduced by 20% on 01/18/2020.  Paclitaxel  held on 08/22/2021 as a chemo holiday. -CTAP on 01/10/2020 with stable small peritoneal soft tissue nodules in the pelvis.  Resolution of soft tissue nodularity in the left abdominal omental fat with no new or progressive disease.    Plan: 1.  Recurrent endometrial carcinosarcoma: - CTAP (04/05/2020): New 2.2 cm soft tissue density along the superior right lateral margin of the vaginal cuff suspicious for local recurrence.  Increased size of 11 mm lymph node, 7 mm previously. - [id number] (04/20/2023): MPL [id number].  No other targetable mutations. - Pathology (05/18/2023): Carcinosarcoma of the left external iliac lymph node biopsy. - We discussed CT chest (06/10/2023): No evidence of metastatic disease. - We have sent tissue for NGS testing.  There was not enough tissue.  We will try another vendor to see if they can complete the testing with minimal amount of tumor cells. - If not we will request our pathology to do HER2 IHC. - Treatment options include retreatment with paclitaxel  or Doxil.  Now on chemo options include lenvatinib/pembrolizumab/Enhertu/NTRK inhibitors based on NGS results. - She does not have any symptoms from early recurrence.  Hence I think it is okay for us  to wait until NGS is testing is done.  She is agreeable.  RTC 3 weeks for follow-up.   2.  Low back pain/right-sided abdominal pain: - She  had right THR on 09/19/2022.  Not requiring oxycodone  for the last few months.   3.  Hypertension: - Continue Norvasc  5 mg daily.  Blood pressure is normal.   4.  Neuropathy in the legs: -  Continue Lyrica  75 mg twice daily.  Neuropathy stable.   5.  Abnormal mammogram: - She has not had a mammogram since 2023.  Will consider ordering mammogram.  6.  Hypomagnesemia: - Continue magnesium  twice daily.  Last magnesium  was normal.    No orders of the defined types were placed in this encounter.     Jessica [name],acting as a Neurosurgeon for Jessica Stands, MD.,have documented all relevant documentation on the behalf of Jessica Stands, MD,as directed by  Jessica Stands, MD while in the presence of Jessica Stands, MD.  ***    Jessica [name]   6/23/20259:59 AM  CHIEF COMPLAINT:   Diagnosis: recurrent endometrial cancer    Cancer Staging  No matching staging information was found for the patient.    Prior Therapy: 1. TAH & BSO, LNDs on 04/13/2018 2. Carboplatin /Paclitaxel , 12/29/18 - 10/05/19 3. Maintenance Paclitaxel , last dose on 08/22/2021  Current Therapy:  surveillance    HISTORY OF PRESENT ILLNESS:   Oncology History  Endometrial cancer (HCC)  03/31/2018 Initial Diagnosis   Endometrial cancer (HCC)   12/29/2018 -  Chemotherapy   Patient is on Treatment Plan : UTERINE Carboplatin  AUC 6 / Paclitaxel  q21d     06/06/2020 Miscellaneous   Summary of oncologic history from Jessica [name]   1.  Recurrent endometrial carcinosarcoma: -TAH, BSO, bilateral pelvic and para-aortic lymph node resections on 04/13/2018. -Pathology showed carcinosarcoma (malignant mixed mullerian tumor) is arising in an endometrial type polyp with no myometrial invasion identified.  High-grade.  0/39 lymph nodes positive.  PT1APN0, FIGO stage Ia.  MMR normal.  MSI-stable. -CTAP on 12/09/2018 for abdominal pain showed extensive peritoneal carcinomatosis and large soft tissue mass in the pelvis. -Biopsy of the omental mass on 12/28/2018 shows poorly differentiated carcinoma consistent with her prior malignancy. -Carboplatin  and paclitaxel  started on 12/29/2018. -CT scan on  05/02/2019 showed peritoneal implants in the low central small bowel mesentery and left pelvic sidewall have decreased in size measuring 1.5 x 1.9 cm and 1.7 x 2.2 cm.  Soft tissue nodule in the left lateral omentum measures 1 x 1.7 cm with no evidence of metastatic disease. -Continuation of chemotherapy until complete response was recommended. -CT AP on 07/04/2019 showed continued positive response to therapy with scattered peritoneal metastasis in the pelvis and left omentum decreased in size.  No new metastatic disease. -CTAP on 10/24/2019 after 14 cycles showed substantial reduction in size of soft tissue nodules in the pelvis.  1.4 x 0.9 x 1 cm soft tissue density in the inferior serosal margin of the sigmoid colon, previously 1.7 x 1.1 x 1.7 cm.  2 small soft tissue nodules in the mesenteric adipose tissue above the urinary bladder measures 0.8 cm. -She has developed serious allergic reaction during cycle 14 with carboplatin . -I have discussed with Dr. Eloy.  Other options include adding ifosfamide to Taxol  or Doxil and combination of lenvatinib with pembrolizumab.  In the lenvatinib pembrolizumab trial carcinosarcomas were not included. -Single agent paclitaxel  started on 11/09/2019, dose reduced by 20% on 01/18/2020. -CTAP on 01/10/2020 with stable small peritoneal soft tissue nodules in the pelvis.  Resolution of soft tissue nodularity in the left abdominal omental fat with no new or progressive disease.     06/20/2020 Imaging  1. Stable examination status post hysterectomy without suspicious enhancing soft tissue nodularity at the vaginal cuff and no significant change in the multiple small soft tissue nodules in the pelvis likely representing treated tumor. No new or progressive findings. 2. Cholelithiasis including a large gallstone in the neck of the gallbladder but without CT evidence of acute cholecystitis. 3. Small hiatal hernia. 4. Aortic atherosclerosis.        INTERVAL HISTORY:    Jessica [name] is a 63 y.o. female presenting to clinic today for follow up of recurrent endometrial cancer. She was last seen by me on 06/18/23.  Today, she states that she is doing well overall. Her appetite level is at ***%. Her energy level is at ***%. She is accompanied by her daughter.   PAST MEDICAL HISTORY:   Past Medical History: Past Medical History:  Diagnosis Date   Allergy    Anemia    Anxiety    Arthritis    Cancer (HCC)    Phreesia 09/05/2019   Cancer of skin of back    Chest pain    Depression    Elevated blood pressure reading    Endometrial cancer (HCC)    Hyperlipidemia    Hypertension    PMB (postmenopausal bleeding)    Port-A-Cath in place 12/24/2018   Pre-diabetes     Surgical History: Past Surgical History:  Procedure Laterality Date   ABDOMINAL HYSTERECTOMY N/A    Phreesia 09/05/2019   IR IMAGING GUIDED PORT INSERTION  12/28/2018   IR RADIOLOGIST EVAL & MGMT  06/02/2018   IR RADIOLOGIST EVAL & MGMT  06/16/2018   NECK SURGERY  2000   spinal surgery , titanium rod in place    ROBOTIC ASSISTED TOTAL HYSTERECTOMY WITH BILATERAL SALPINGO OOPHERECTOMY N/A 04/13/2018   Procedure: XI ROBOTIC ASSISTED TOTAL HYSTERECTOMY WITH BILATERAL SALPINGO OOPHORECTOMY;  Surgeon: Jessica [name] Herring, MD;  Location: [facility];  Service: Gynecology;  Laterality: N/A;   ROBOTIC PELVIC AND PARA-AORTIC LYMPH NODE DISSECTION N/A 04/13/2018   Procedure: XI ROBOTIC PELVIC LYMPHADECTOMY AND PARA-AORTIC LYMPH NODE DISSECTION;  Surgeon: Jessica [name] Herring, MD;  Location: [facility];  Service: Gynecology;  Laterality: N/A;   TOTAL HIP ARTHROPLASTY Right 09/19/2022   Procedure: RIGHT TOTAL HIP ARTHROPLASTY ANTERIOR APPROACH;  Surgeon: Vernetta Lonni GRADE, MD;  Location: [facility];  Service: Orthopedics;  Laterality: Right;    Social History: Social History   Socioeconomic History   Marital status: Widowed    Spouse name: Not on file   Number of children: 3   Years of education: Not on file   Highest  education level: 12th grade  Occupational History   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  Tobacco Use   Smoking status: Never   Smokeless tobacco: Never  Vaping Use   Vaping status: Never Used  Substance and Sexual Activity   Alcohol use: Yes    Alcohol/week: 4.0 standard drinks of alcohol    Types: 4 Glasses of wine per week    Comment: weekends   Drug use: Never   Sexual activity: Not Currently  Other Topics Concern   Not on file  Social History Narrative      Wears seat belt    Does not use phone while driving    Smoke detectors at home   Fire extinguisher-no   No weapons   Social Drivers of Health   Financial Resource Strain: Low Risk  (07/03/2023)   Overall Financial Resource Strain (CARDIA)    Difficulty of Paying Living  Expenses: Not very hard  Food Insecurity: No Food Insecurity (07/03/2023)   Hunger Vital Sign    Worried About Running Out of Food in the Last Year: Never true    Ran Out of Food in the Last Year: Never true  Transportation Needs: No Transportation Needs (07/03/2023)   PRAPARE - Administrator, Civil Service (Medical): No    Lack of Transportation (Non-Medical): No  Physical Activity: Sufficiently Active (07/03/2023)   Exercise Vital Sign    Days of Exercise per Week: 3 days    Minutes of Exercise per Session: 50 min  Stress: No Stress Concern Present (07/03/2023)   [employer] of Occupational Health - Occupational Stress Questionnaire    Feeling of Stress: Only a little  Recent Concern: Stress - Stress Concern Present (04/15/2023)   [employer] of Occupational Health - Occupational Stress Questionnaire    Feeling of Stress : To some extent  Social Connections: Socially Isolated (07/03/2023)   Social Connection and Isolation Panel    Frequency of Communication with Friends and Family: Three times a week    Frequency of Social Gatherings with Friends and Family: Never    Attends Religious Services: Never    Automotive engineer or Organizations: No    Attends Engineer, structural: Not on file    Marital Status: Widowed  Intimate Partner Violence: Not At Risk (04/15/2023)   Humiliation, Afraid, Rape, and Kick questionnaire    Fear of Current or Ex-Partner: No    Emotionally Abused: No    Physically Abused: No    Sexually Abused: No    Family History: Family History  Problem Relation Age of Onset   Hypertension Mother    Breast cancer Mother    Hypertension Father    Heart disease Father    Cancer Father        lung   Cancer Sister        ovarian cancer   Melanoma Sister     Current Medications:  Current Outpatient Medications:    amLODipine  (NORVASC ) 5 MG tablet, Take 1 tablet by mouth once daily, Disp: 90 tablet, Rfl: 0   atorvastatin  (LIPITOR) 20 MG tablet, Take 1 tablet by mouth once daily, Disp: 90 tablet, Rfl: 0   Cholecalciferol  (VITAMIN D3) 25 MCG (1000 UT) CAPS, Take 1 capsule (1,000 Units total) by mouth daily., Disp: 60 capsule, Rfl: 2   lidocaine -prilocaine  (EMLA ) cream, Apply to port site prior to use, Disp: 30 g, Rfl: 0   MAGNESIUM -OXIDE 400 (240 Mg) MG tablet, Take 1 tablet by mouth twice daily, Disp: 60 tablet, Rfl: 0   meclizine  (ANTIVERT ) 25 MG tablet, Take 1 tablet (25 mg total) by mouth 3 (three) times daily as needed for dizziness., Disp: 30 tablet, Rfl: 3   methocarbamol  (ROBAXIN ) 500 MG tablet, Take 1 tablet (500 mg total) by mouth every 6 (six) hours as needed for muscle spasms., Disp: 40 tablet, Rfl: 1   nitroGLYCERIN  (NITROSTAT ) 0.4 MG SL tablet, Place 1 tablet (0.4 mg total) under the tongue every 5 (five) minutes as needed for chest pain., Disp: 25 tablet, Rfl: 3   PACLITAXEL  IV, Inject into the vein every 21 ( twenty-one) days., Disp: , Rfl:    pregabalin  (LYRICA ) 75 MG capsule, TAKE 1 CAPSULE BY MOUTH THREE TIMES DAILY, Disp: 90 capsule, Rfl: 3   prochlorperazine  (COMPAZINE ) 10 MG tablet, Take 1 tablet (10 mg total) by mouth every 6 (six) hours as  needed (Nausea or  vomiting)., Disp: 60 tablet, Rfl: 3   sertraline  (ZOLOFT ) 100 MG tablet, Take 1 tablet by mouth once daily, Disp: 30 tablet, Rfl: 0 No current facility-administered medications for this visit.  Facility-Administered Medications Ordered in Other Visits:    diphenhydrAMINE  (BENADRYL ) 50 MG/ML injection, , , ,    palonosetron  (ALOXI ) 0.25 MG/5ML injection, , , ,    Allergies: Allergies  Allergen Reactions   Carboplatin  Other (See Comments)    body on fire and abdominal pain   Nickel Rash    itchy    REVIEW OF SYSTEMS:   Review of Systems  Constitutional:  Negative for chills, fatigue and fever.  HENT:   Negative for lump/mass, mouth sores, nosebleeds, sore throat and trouble swallowing.   Eyes:  Negative for eye problems.  Respiratory:  Negative for cough and shortness of breath.   Cardiovascular:  Negative for chest pain, leg swelling and palpitations.  Gastrointestinal:  Negative for abdominal pain, constipation, diarrhea, nausea and vomiting.  Genitourinary:  Negative for bladder incontinence, difficulty urinating, dysuria, frequency, hematuria and nocturia.   Musculoskeletal:  Negative for arthralgias, back pain, flank pain, myalgias and neck pain.  Skin:  Negative for itching and rash.  Neurological:  Negative for dizziness, headaches and numbness.  Hematological:  Does not bruise/bleed easily.  Psychiatric/Behavioral:  Negative for depression, sleep disturbance and suicidal ideas. The patient is not nervous/anxious.   All other systems reviewed and are negative.    VITALS:   There were no vitals taken for this visit.  Wt Readings from Last 3 Encounters:  07/09/23 228 lb 12.8 oz (103.8 kg)  07/07/23 228 lb 9.6 oz (103.7 kg)  06/18/23 224 lb (101.6 kg)    There is no height or weight on file to calculate BMI.  Performance status (ECOG): 1 - Symptomatic but completely ambulatory  PHYSICAL EXAM:   Physical Exam Vitals and nursing note reviewed.  Exam conducted with a chaperone present.  Constitutional:      Appearance: Normal appearance.   Cardiovascular:     Rate and Rhythm: Normal rate and regular rhythm.     Pulses: Normal pulses.     Heart sounds: Normal heart sounds.  Pulmonary:     Effort: Pulmonary effort is normal.     Breath sounds: Normal breath sounds.  Abdominal:     Palpations: Abdomen is soft. There is no hepatomegaly, splenomegaly or mass.     Tenderness: There is no abdominal tenderness.   Musculoskeletal:     Right lower leg: No edema.     Left lower leg: No edema.  Lymphadenopathy:     Cervical: No cervical adenopathy.     Right cervical: No superficial, deep or posterior cervical adenopathy.    Left cervical: No superficial, deep or posterior cervical adenopathy.     Upper Body:     Right upper body: No supraclavicular or axillary adenopathy.     Left upper body: No supraclavicular or axillary adenopathy.   Neurological:     General: No focal deficit present.     Mental Status: She is alert and oriented to person, place, and time.   Psychiatric:        Mood and Affect: Mood normal.        Behavior: Behavior normal.     LABS:   CBC     Component Value Date/Time   WBC 7.2 07/07/2023 1027   WBC 7.0 05/18/2023 0824   RBC 5.01 07/07/2023 1027   RBC 4.88 05/18/2023 0824  HGB 13.3 07/07/2023 1027   HCT 42.7 07/07/2023 1027   PLT 305 07/07/2023 1027   MCV 85 07/07/2023 1027   MCH 26.5 (L) 07/07/2023 1027   MCH 26.8 05/18/2023 0824   MCHC 31.1 (L) 07/07/2023 1027   MCHC 32.3 05/18/2023 0824   RDW 15.3 07/07/2023 1027   LYMPHSABS 2.6 07/07/2023 1027   MONOABS 0.6 05/18/2023 0824   EOSABS 0.2 07/07/2023 1027   BASOSABS 0.1 07/07/2023 1027    CMP      Component Value Date/Time   NA 145 (H) 07/07/2023 1027   K 4.8 07/07/2023 1027   CL 105 07/07/2023 1027   CO2 22 07/07/2023 1027   GLUCOSE 104 (H) 07/07/2023 1027   GLUCOSE 115 (H) 05/18/2023 0824   BUN 14 07/07/2023 1027    CREATININE 0.78 07/07/2023 1027   CALCIUM  10.0 07/07/2023 1027   PROT 7.0 07/07/2023 1027   ALBUMIN 4.4 07/07/2023 1027   AST 23 07/07/2023 1027   ALT 20 07/07/2023 1027   ALKPHOS 145 (H) 07/07/2023 1027   BILITOT 0.3 07/07/2023 1027   GFRNONAA >60 05/18/2023 0824   GFRAA >60 10/05/2019 0825     No results found for: CEA1, CEA / No results found for: CEA1, CEA No results found for: PSA1 No results found for: CAN199 Lab Results  Component Value Date   CAN125 6.3 04/06/2023    No results found for: TOTALPROTELP, ALBUMINELP, A1GS, A2GS, BETS, BETA2SER, GAMS, MSPIKE, SPEI No results found for: TIBC, FERRITIN, IRONPCTSAT Lab Results  Component Value Date   LDH 160 02/09/2019   LDH 331 (H) 12/22/2018     STUDIES:   No results found.

## 2023-07-16 ENCOUNTER — Encounter (HOSPITAL_COMMUNITY): Payer: Self-pay | Admitting: Hematology

## 2023-07-16 ENCOUNTER — Inpatient Hospital Stay: Admitting: Hematology

## 2023-07-16 NOTE — Progress Notes (Incomplete)
 [facility] [address] [place] [address]    Clinic Day:  07/16/2023  Referring physician: Melvenia Manus BRAVO, MD  Patient Care Team: [name], FNP as PCP - General (Family Medicine) Mallipeddi, [name], MD as PCP - Cardiology (Cardiology) Wilson, [name], RN as Oncology Nurse Navigator [name], MD as Medical Oncologist (Oncology)   ASSESSMENT & PLAN:   Assessment: 1.  Recurrent endometrial carcinosarcoma: -TAH, BSO, bilateral pelvic and para-aortic lymph node resections on 04/13/2018. -Pathology showed carcinosarcoma (malignant mixed mullerian tumor) is arising in an endometrial type polyp with no myometrial invasion identified.  High-grade.  0/39 lymph nodes positive.  PT1APN0, FIGO stage Ia.  MMR normal.  MSI-stable. -CTAP on 12/09/2018 for abdominal pain showed extensive peritoneal carcinomatosis and large soft tissue mass in the pelvis. -Biopsy of the omental mass on 12/28/2018 shows poorly differentiated carcinoma consistent with her prior malignancy. -Carboplatin  and paclitaxel  started on 12/29/2018. -CT scan on 05/02/2019 showed peritoneal implants in the low central small bowel mesentery and left pelvic sidewall have decreased in size measuring 1.5 x 1.9 cm and 1.7 x 2.2 cm.  Soft tissue nodule in the left lateral omentum measures 1 x 1.7 cm with no evidence of metastatic disease. -Continuation of chemotherapy until complete response was recommended. -CT AP on 07/04/2019 showed continued positive response to therapy with scattered peritoneal metastasis in the pelvis and left omentum decreased in size.  No new metastatic disease. -CTAP on 10/24/2019 after 14 cycles showed substantial reduction in size of soft tissue nodules in the pelvis.  1.4 x 0.9 x 1 cm soft tissue density in the inferior serosal margin of the sigmoid colon, previously 1.7 x 1.1 x 1.7 cm.  2 small soft tissue nodules in the mesenteric adipose tissue above the urinary bladder  measures 0.8 cm. -She has developed serious allergic reaction during cycle 14 with carboplatin . -I have discussed with Dr. Eloy.  Other options include adding ifosfamide to Taxol  or Doxil and combination of lenvatinib with pembrolizumab.  In the lenvatinib pembrolizumab trial carcinosarcomas were not included. -Single agent paclitaxel  started on 11/09/2019, dose reduced by 20% on 01/18/2020.  Paclitaxel  held on 08/22/2021 as a chemo holiday. -CTAP on 01/10/2020 with stable small peritoneal soft tissue nodules in the pelvis.  Resolution of soft tissue nodularity in the left abdominal omental fat with no new or progressive disease.    Plan: 1.  Recurrent endometrial carcinosarcoma: - CTAP (04/05/2020): New 2.2 cm soft tissue density along the superior right lateral margin of the vaginal cuff suspicious for local recurrence.  Increased size of 11 mm lymph node, 7 mm previously. - [id number] (04/20/2023): MPL [id number].  No other targetable mutations. - Pathology (05/18/2023): Carcinosarcoma of the left external iliac lymph node biopsy. - We discussed CT chest (06/10/2023): No evidence of metastatic disease. - We have sent tissue for NGS testing.  There was not enough tissue.  We will try another vendor to see if they can complete the testing with minimal amount of tumor cells. - If not we will request our pathology to do HER2 IHC. - Treatment options include retreatment with paclitaxel  or Doxil.  Now on chemo options include lenvatinib/pembrolizumab/Enhertu/NTRK inhibitors based on NGS results. - She does not have any symptoms from early recurrence.  Hence I think it is okay for us  to wait until NGS is testing is done.  She is agreeable.  RTC 3 weeks for follow-up.   2.  Low back pain/right-sided abdominal pain: - She  had right THR on 09/19/2022.  Not requiring oxycodone  for the last few months.   3.  Hypertension: - Continue Norvasc  5 mg daily.  Blood pressure is normal.   4.  Neuropathy in the legs: -  Continue Lyrica  75 mg twice daily.  Neuropathy stable.   5.  Abnormal mammogram: - She has not had a mammogram since 2023.  Will consider ordering mammogram.  6.  Hypomagnesemia: - Continue magnesium  twice daily.  Last magnesium  was normal.    No orders of the defined types were placed in this encounter.     Jessica [name],acting as a Neurosurgeon for [name], MD.,have documented all relevant documentation on the behalf of [name], MD,as directed by  [name], MD while in the presence of [name], MD.  ***    Jessica [name]   6/26/20258:59 AM  CHIEF COMPLAINT:   Diagnosis: recurrent endometrial cancer    Cancer Staging  No matching staging information was found for the patient.    Prior Therapy: 1. TAH & BSO, LNDs on 04/13/2018 2. Carboplatin /Paclitaxel , 12/29/18 - 10/05/19 3. Maintenance Paclitaxel , last dose on 08/22/2021  Current Therapy:  surveillance    HISTORY OF PRESENT ILLNESS:   Oncology History  Endometrial cancer (HCC)  03/31/2018 Initial Diagnosis   Endometrial cancer (HCC)   12/29/2018 -  Chemotherapy   Patient is on Treatment Plan : UTERINE Carboplatin  AUC 6 / Paclitaxel  q21d     06/06/2020 Miscellaneous   Summary of oncologic history from Dr. Katragadda   1.  Recurrent endometrial carcinosarcoma: -TAH, BSO, bilateral pelvic and para-aortic lymph node resections on 04/13/2018. -Pathology showed carcinosarcoma (malignant mixed mullerian tumor) is arising in an endometrial type polyp with no myometrial invasion identified.  High-grade.  0/39 lymph nodes positive.  PT1APN0, FIGO stage Ia.  MMR normal.  MSI-stable. -CTAP on 12/09/2018 for abdominal pain showed extensive peritoneal carcinomatosis and large soft tissue mass in the pelvis. -Biopsy of the omental mass on 12/28/2018 shows poorly differentiated carcinoma consistent with her prior malignancy. -Carboplatin  and paclitaxel  started on 12/29/2018. -CT scan on  05/02/2019 showed peritoneal implants in the low central small bowel mesentery and left pelvic sidewall have decreased in size measuring 1.5 x 1.9 cm and 1.7 x 2.2 cm.  Soft tissue nodule in the left lateral omentum measures 1 x 1.7 cm with no evidence of metastatic disease. -Continuation of chemotherapy until complete response was recommended. -CT AP on 07/04/2019 showed continued positive response to therapy with scattered peritoneal metastasis in the pelvis and left omentum decreased in size.  No new metastatic disease. -CTAP on 10/24/2019 after 14 cycles showed substantial reduction in size of soft tissue nodules in the pelvis.  1.4 x 0.9 x 1 cm soft tissue density in the inferior serosal margin of the sigmoid colon, previously 1.7 x 1.1 x 1.7 cm.  2 small soft tissue nodules in the mesenteric adipose tissue above the urinary bladder measures 0.8 cm. -She has developed serious allergic reaction during cycle 14 with carboplatin . -I have discussed with Dr. Eloy.  Other options include adding ifosfamide to Taxol  or Doxil and combination of lenvatinib with pembrolizumab.  In the lenvatinib pembrolizumab trial carcinosarcomas were not included. -Single agent paclitaxel  started on 11/09/2019, dose reduced by 20% on 01/18/2020. -CTAP on 01/10/2020 with stable small peritoneal soft tissue nodules in the pelvis.  Resolution of soft tissue nodularity in the left abdominal omental fat with no new or progressive disease.     06/20/2020 Imaging  1. Stable examination status post hysterectomy without suspicious enhancing soft tissue nodularity at the vaginal cuff and no significant change in the multiple small soft tissue nodules in the pelvis likely representing treated tumor. No new or progressive findings. 2. Cholelithiasis including a large gallstone in the neck of the gallbladder but without CT evidence of acute cholecystitis. 3. Small hiatal hernia. 4. Aortic atherosclerosis.        INTERVAL HISTORY:    Jessica [name] is a 63 y.o. female presenting to clinic today for follow up of recurrent endometrial cancer. She was last seen by me on 06/18/23.  Today, she states that she is doing well overall. Her appetite level is at ***%. Her energy level is at ***%. She is accompanied by her daughter.   PAST MEDICAL HISTORY:   Past Medical History: Past Medical History:  Diagnosis Date   Allergy    Anemia    Anxiety    Arthritis    Cancer (HCC)    Phreesia 09/05/2019   Cancer of skin of back    Chest pain    Depression    Elevated blood pressure reading    Endometrial cancer (HCC)    Hyperlipidemia    Hypertension    PMB (postmenopausal bleeding)    Port-A-Cath in place 12/24/2018   Pre-diabetes     Surgical History: Past Surgical History:  Procedure Laterality Date   ABDOMINAL HYSTERECTOMY N/A    Phreesia 09/05/2019   IR IMAGING GUIDED PORT INSERTION  12/28/2018   IR RADIOLOGIST EVAL & MGMT  06/02/2018   IR RADIOLOGIST EVAL & MGMT  06/16/2018   NECK SURGERY  2000   spinal surgery , titanium rod in place    ROBOTIC ASSISTED TOTAL HYSTERECTOMY WITH BILATERAL SALPINGO OOPHERECTOMY N/A 04/13/2018   Procedure: XI ROBOTIC ASSISTED TOTAL HYSTERECTOMY WITH BILATERAL SALPINGO OOPHORECTOMY;  Surgeon: Eloy Herring, MD;  Location: [facility];  Service: Gynecology;  Laterality: N/A;   ROBOTIC PELVIC AND PARA-AORTIC LYMPH NODE DISSECTION N/A 04/13/2018   Procedure: XI ROBOTIC PELVIC LYMPHADECTOMY AND PARA-AORTIC LYMPH NODE DISSECTION;  Surgeon: Eloy Herring, MD;  Location: [facility];  Service: Gynecology;  Laterality: N/A;   TOTAL HIP ARTHROPLASTY Right 09/19/2022   Procedure: RIGHT TOTAL HIP ARTHROPLASTY ANTERIOR APPROACH;  Surgeon: Vernetta Lonni GRADE, MD;  Location: [facility];  Service: Orthopedics;  Laterality: Right;    Social History: Social History   Socioeconomic History   Marital status: Widowed    Spouse name: Not on file   Number of children: 3   Years of education: Not on file   Highest  education level: 12th grade  Occupational History   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  Tobacco Use   Smoking status: Never   Smokeless tobacco: Never  Vaping Use   Vaping status: Never Used  Substance and Sexual Activity   Alcohol use: Yes    Alcohol/week: 4.0 standard drinks of alcohol    Types: 4 Glasses of wine per week    Comment: weekends   Drug use: Never   Sexual activity: Not Currently  Other Topics Concern   Not on file  Social History Narrative      Wears seat belt    Does not use phone while driving    Smoke detectors at home   Fire extinguisher-no   No weapons   Social Drivers of Health   Financial Resource Strain: Low Risk  (07/03/2023)   Overall Financial Resource Strain (CARDIA)    Difficulty of Paying Living  Expenses: Not very hard  Food Insecurity: No Food Insecurity (07/03/2023)   Hunger Vital Sign    Worried About Running Out of Food in the Last Year: Never true    Ran Out of Food in the Last Year: Never true  Transportation Needs: No Transportation Needs (07/03/2023)   PRAPARE - Administrator, Civil Service (Medical): No    Lack of Transportation (Non-Medical): No  Physical Activity: Sufficiently Active (07/03/2023)   Exercise Vital Sign    Days of Exercise per Week: 3 days    Minutes of Exercise per Session: 50 min  Stress: No Stress Concern Present (07/03/2023)   [employer] of Occupational Health - Occupational Stress Questionnaire    Feeling of Stress: Only a little  Recent Concern: Stress - Stress Concern Present (04/15/2023)   [employer] of Occupational Health - Occupational Stress Questionnaire    Feeling of Stress : To some extent  Social Connections: Socially Isolated (07/03/2023)   Social Connection and Isolation Panel    Frequency of Communication with Friends and Family: Three times a week    Frequency of Social Gatherings with Friends and Family: Never    Attends Religious Services: Never    Automotive engineer or Organizations: No    Attends Engineer, structural: Not on file    Marital Status: Widowed  Intimate Partner Violence: Not At Risk (04/15/2023)   Humiliation, Afraid, Rape, and Kick questionnaire    Fear of Current or Ex-Partner: No    Emotionally Abused: No    Physically Abused: No    Sexually Abused: No    Family History: Family History  Problem Relation Age of Onset   Hypertension Mother    Breast cancer Mother    Hypertension Father    Heart disease Father    Cancer Father        lung   Cancer Sister        ovarian cancer   Melanoma Sister     Current Medications:  Current Outpatient Medications:    amLODipine  (NORVASC ) 5 MG tablet, Take 1 tablet by mouth once daily, Disp: 90 tablet, Rfl: 0   atorvastatin  (LIPITOR) 20 MG tablet, Take 1 tablet by mouth once daily, Disp: 90 tablet, Rfl: 0   Cholecalciferol  (VITAMIN D3) 25 MCG (1000 UT) CAPS, Take 1 capsule (1,000 Units total) by mouth daily., Disp: 60 capsule, Rfl: 2   lidocaine -prilocaine  (EMLA ) cream, Apply to port site prior to use, Disp: 30 g, Rfl: 0   MAGNESIUM -OXIDE 400 (240 Mg) MG tablet, Take 1 tablet by mouth twice daily, Disp: 60 tablet, Rfl: 0   meclizine  (ANTIVERT ) 25 MG tablet, Take 1 tablet (25 mg total) by mouth 3 (three) times daily as needed for dizziness., Disp: 30 tablet, Rfl: 3   methocarbamol  (ROBAXIN ) 500 MG tablet, Take 1 tablet (500 mg total) by mouth every 6 (six) hours as needed for muscle spasms., Disp: 40 tablet, Rfl: 1   nitroGLYCERIN  (NITROSTAT ) 0.4 MG SL tablet, Place 1 tablet (0.4 mg total) under the tongue every 5 (five) minutes as needed for chest pain., Disp: 25 tablet, Rfl: 3   PACLITAXEL  IV, Inject into the vein every 21 ( twenty-one) days., Disp: , Rfl:    pregabalin  (LYRICA ) 75 MG capsule, TAKE 1 CAPSULE BY MOUTH THREE TIMES DAILY, Disp: 90 capsule, Rfl: 3   prochlorperazine  (COMPAZINE ) 10 MG tablet, Take 1 tablet (10 mg total) by mouth every 6 (six) hours as  needed (Nausea or  vomiting)., Disp: 60 tablet, Rfl: 3   sertraline  (ZOLOFT ) 100 MG tablet, Take 1 tablet by mouth once daily, Disp: 30 tablet, Rfl: 0 No current facility-administered medications for this visit.  Facility-Administered Medications Ordered in Other Visits:    diphenhydrAMINE  (BENADRYL ) 50 MG/ML injection, , , ,    palonosetron  (ALOXI ) 0.25 MG/5ML injection, , , ,    Allergies: Allergies  Allergen Reactions   Carboplatin  Other (See Comments)    body on fire and abdominal pain   Nickel Rash    itchy    REVIEW OF SYSTEMS:   Review of Systems  Constitutional:  Negative for chills, fatigue and fever.  HENT:   Negative for lump/mass, mouth sores, nosebleeds, sore throat and trouble swallowing.   Eyes:  Negative for eye problems.  Respiratory:  Negative for cough and shortness of breath.   Cardiovascular:  Negative for chest pain, leg swelling and palpitations.  Gastrointestinal:  Negative for abdominal pain, constipation, diarrhea, nausea and vomiting.  Genitourinary:  Negative for bladder incontinence, difficulty urinating, dysuria, frequency, hematuria and nocturia.   Musculoskeletal:  Negative for arthralgias, back pain, flank pain, myalgias and neck pain.  Skin:  Negative for itching and rash.  Neurological:  Negative for dizziness, headaches and numbness.  Hematological:  Does not bruise/bleed easily.  Psychiatric/Behavioral:  Negative for depression, sleep disturbance and suicidal ideas. The patient is not nervous/anxious.   All other systems reviewed and are negative.    VITALS:   There were no vitals taken for this visit.  Wt Readings from Last 3 Encounters:  07/09/23 228 lb 12.8 oz (103.8 kg)  07/07/23 228 lb 9.6 oz (103.7 kg)  06/18/23 224 lb (101.6 kg)    There is no height or weight on file to calculate BMI.  Performance status (ECOG): 1 - Symptomatic but completely ambulatory  PHYSICAL EXAM:   Physical Exam Vitals and nursing note reviewed.  Exam conducted with a chaperone present.  Constitutional:      Appearance: Normal appearance.   Cardiovascular:     Rate and Rhythm: Normal rate and regular rhythm.     Pulses: Normal pulses.     Heart sounds: Normal heart sounds.  Pulmonary:     Effort: Pulmonary effort is normal.     Breath sounds: Normal breath sounds.  Abdominal:     Palpations: Abdomen is soft. There is no hepatomegaly, splenomegaly or mass.     Tenderness: There is no abdominal tenderness.   Musculoskeletal:     Right lower leg: No edema.     Left lower leg: No edema.  Lymphadenopathy:     Cervical: No cervical adenopathy.     Right cervical: No superficial, deep or posterior cervical adenopathy.    Left cervical: No superficial, deep or posterior cervical adenopathy.     Upper Body:     Right upper body: No supraclavicular or axillary adenopathy.     Left upper body: No supraclavicular or axillary adenopathy.   Neurological:     General: No focal deficit present.     Mental Status: She is alert and oriented to person, place, and time.   Psychiatric:        Mood and Affect: Mood normal.        Behavior: Behavior normal.     LABS:   CBC     Component Value Date/Time   WBC 7.2 07/07/2023 1027   WBC 7.0 05/18/2023 0824   RBC 5.01 07/07/2023 1027   RBC 4.88 05/18/2023 0824  HGB 13.3 07/07/2023 1027   HCT 42.7 07/07/2023 1027   PLT 305 07/07/2023 1027   MCV 85 07/07/2023 1027   MCH 26.5 (L) 07/07/2023 1027   MCH 26.8 05/18/2023 0824   MCHC 31.1 (L) 07/07/2023 1027   MCHC 32.3 05/18/2023 0824   RDW 15.3 07/07/2023 1027   LYMPHSABS 2.6 07/07/2023 1027   MONOABS 0.6 05/18/2023 0824   EOSABS 0.2 07/07/2023 1027   BASOSABS 0.1 07/07/2023 1027    CMP      Component Value Date/Time   NA 145 (H) 07/07/2023 1027   K 4.8 07/07/2023 1027   CL 105 07/07/2023 1027   CO2 22 07/07/2023 1027   GLUCOSE 104 (H) 07/07/2023 1027   GLUCOSE 115 (H) 05/18/2023 0824   BUN 14 07/07/2023 1027    CREATININE 0.78 07/07/2023 1027   CALCIUM  10.0 07/07/2023 1027   PROT 7.0 07/07/2023 1027   ALBUMIN 4.4 07/07/2023 1027   AST 23 07/07/2023 1027   ALT 20 07/07/2023 1027   ALKPHOS 145 (H) 07/07/2023 1027   BILITOT 0.3 07/07/2023 1027   GFRNONAA >60 05/18/2023 0824   GFRAA >60 10/05/2019 0825     No results found for: CEA1, CEA / No results found for: CEA1, CEA No results found for: PSA1 No results found for: CAN199 Lab Results  Component Value Date   CAN125 6.3 04/06/2023    No results found for: TOTALPROTELP, ALBUMINELP, A1GS, A2GS, BETS, BETA2SER, GAMS, MSPIKE, SPEI No results found for: TIBC, FERRITIN, IRONPCTSAT Lab Results  Component Value Date   LDH 160 02/09/2019   LDH 331 (H) 12/22/2018     STUDIES:   MM 3D SCREENING MAMMOGRAM BILATERAL BREAST Result Date: 07/14/2023 CLINICAL DATA:  Screening. EXAM: DIGITAL SCREENING BILATERAL MAMMOGRAM WITH TOMOSYNTHESIS AND CAD TECHNIQUE: Bilateral screening digital craniocaudal and mediolateral oblique mammograms were obtained. Bilateral screening digital breast tomosynthesis was performed. The images were evaluated with computer-aided detection. COMPARISON:  Previous exam(s). ACR Breast Density Category b: There are scattered areas of fibroglandular density. FINDINGS: There are no findings suspicious for malignancy. IMPRESSION: No mammographic evidence of malignancy. A result letter of this screening mammogram will be mailed directly to the patient. RECOMMENDATION: Screening mammogram in one year. (Code:SM-B-01Y) BI-RADS CATEGORY  1: Negative. Electronically Signed   By: Inocente Ast M.D.   On: 07/14/2023 15:57

## 2023-07-19 ENCOUNTER — Other Ambulatory Visit: Payer: Self-pay | Admitting: Hematology

## 2023-07-20 ENCOUNTER — Encounter: Payer: Self-pay | Admitting: Hematology

## 2023-07-23 ENCOUNTER — Inpatient Hospital Stay: Attending: Hematology | Admitting: Hematology

## 2023-07-23 DIAGNOSIS — Z5111 Encounter for antineoplastic chemotherapy: Secondary | ICD-10-CM | POA: Insufficient documentation

## 2023-07-23 DIAGNOSIS — C541 Malignant neoplasm of endometrium: Secondary | ICD-10-CM

## 2023-07-23 DIAGNOSIS — C775 Secondary and unspecified malignant neoplasm of intrapelvic lymph nodes: Secondary | ICD-10-CM | POA: Insufficient documentation

## 2023-07-23 DIAGNOSIS — Z79899 Other long term (current) drug therapy: Secondary | ICD-10-CM | POA: Insufficient documentation

## 2023-07-23 DIAGNOSIS — Z8542 Personal history of malignant neoplasm of other parts of uterus: Secondary | ICD-10-CM | POA: Insufficient documentation

## 2023-07-23 DIAGNOSIS — R1032 Left lower quadrant pain: Secondary | ICD-10-CM | POA: Insufficient documentation

## 2023-07-23 DIAGNOSIS — Z95828 Presence of other vascular implants and grafts: Secondary | ICD-10-CM | POA: Diagnosis not present

## 2023-07-23 DIAGNOSIS — Z90710 Acquired absence of both cervix and uterus: Secondary | ICD-10-CM | POA: Insufficient documentation

## 2023-07-23 DIAGNOSIS — I10 Essential (primary) hypertension: Secondary | ICD-10-CM | POA: Insufficient documentation

## 2023-07-23 DIAGNOSIS — G629 Polyneuropathy, unspecified: Secondary | ICD-10-CM | POA: Insufficient documentation

## 2023-07-23 DIAGNOSIS — C786 Secondary malignant neoplasm of retroperitoneum and peritoneum: Secondary | ICD-10-CM | POA: Insufficient documentation

## 2023-07-23 DIAGNOSIS — R928 Other abnormal and inconclusive findings on diagnostic imaging of breast: Secondary | ICD-10-CM | POA: Insufficient documentation

## 2023-07-23 MED ORDER — PROCHLORPERAZINE MALEATE 10 MG PO TABS: 10.0000 mg | ORAL_TABLET | Freq: Four times a day (QID) | ORAL | 3 refills | Status: DC | PRN

## 2023-07-23 MED ORDER — MAGNESIUM OXIDE -MG SUPPLEMENT 400 (240 MG) MG PO TABS: 1.0000 | ORAL_TABLET | Freq: Two times a day (BID) | ORAL | 3 refills | Status: DC

## 2023-07-23 NOTE — Progress Notes (Signed)
 [facility] [address], [place] [address]    Clinic Day:  07/23/23   Referring physician: Melvenia Manus BRAVO, MD  Patient Care Team: [name], FNP as PCP - General (Family Medicine) Mallipeddi, [name], MD as PCP - Cardiology (Cardiology) Wilson, [name], RN as Oncology Nurse Navigator [name], MD as Medical Oncologist (Oncology)   ASSESSMENT & PLAN:   Assessment: 1.  Recurrent endometrial carcinosarcoma: -TAH, BSO, bilateral pelvic and para-aortic lymph node resections on 04/13/2018. -Pathology showed carcinosarcoma (malignant mixed mullerian tumor) is arising in an endometrial type polyp with no myometrial invasion identified.  High-grade.  0/39 lymph nodes positive.  PT1APN0, FIGO stage Ia.  MMR normal.  MSI-stable. -CTAP on 12/09/2018 for abdominal pain showed extensive peritoneal carcinomatosis and large soft tissue mass in the pelvis. -Biopsy of the omental mass on 12/28/2018 shows poorly differentiated carcinoma consistent with her prior malignancy. -Carboplatin  and paclitaxel  started on 12/29/2018. -CT scan on 05/02/2019 showed peritoneal implants in the low central small bowel mesentery and left pelvic sidewall have decreased in size measuring 1.5 x 1.9 cm and 1.7 x 2.2 cm.  Soft tissue nodule in the left lateral omentum measures 1 x 1.7 cm with no evidence of metastatic disease. -Continuation of chemotherapy until complete response was recommended. -CT AP on 07/04/2019 showed continued positive response to therapy with scattered peritoneal metastasis in the pelvis and left omentum decreased in size.  No new metastatic disease. -CTAP on 10/24/2019 after 14 cycles showed substantial reduction in size of soft tissue nodules in the pelvis.  1.4 x 0.9 x 1 cm soft tissue density in the inferior serosal margin of the sigmoid colon, previously 1.7 x 1.1 x 1.7 cm.  2 small soft tissue nodules in the mesenteric adipose tissue above the urinary bladder  measures 0.8 cm. -She has developed serious allergic reaction during cycle 14 with carboplatin . -I have discussed with Dr. Eloy.  Other options include adding ifosfamide to Taxol  or Doxil and combination of lenvatinib with pembrolizumab.  In the lenvatinib pembrolizumab trial carcinosarcomas were not included. -Single agent paclitaxel  started on 11/09/2019, dose reduced by 20% on 01/18/2020.  Paclitaxel  held on 08/22/2021 as a chemo holiday. -CTAP on 01/10/2020 with stable small peritoneal soft tissue nodules in the pelvis.  Resolution of soft tissue nodularity in the left abdominal omental fat with no new or progressive disease. - [employer] (04/20/2023): MPL [id number].  No other targetable mutations. - Left external iliac lymph node biopsy (05/17/2020): Carcinosarcoma - NGS testing: Not enough tissue.  HER2 by IHC pending.    Plan: 1.  Recurrent endometrial carcinosarcoma: - CTAP (04/05/2020): New 2.2 cm soft tissue density along the superior right lateral margin of the vaginal cuff suspicious for local recurrence.  Increased size of 11 mm lymph node, 7 mm previously. - We have sent [facility] NGS.  Tissue quantity is not sufficient.  We have sent it to [facility], again with the same result.  We have requested HER2 IHC which is pending. - She reports that she is experiencing more bloating sensation.  She also reports left lower quadrant pain on and off.  She has diarrhea/nausea on and off.  She is very nervous about her cancer spreading.   - We have discussed options including paclitaxel  which she has received in the past.  Other chemo option is Doxil.  We have also discussed lenvatinib and pembrolizumab.  She will be eligible for Enhertu if IHC is 3+. - She would like to proceed  with paclitaxel  which has helped her in the past.  Her neuropathy is manageable at this time.  We will schedule her treatment next week.  Will also obtain a baseline CTAP with contrast.  RTC prior to cycle 2.   2.  Left lower  quadrant pain: - She reports left lower quadrant pain on and off which is new.  She also has bloating sensation which is new.  Not requiring any pain medication.   3.  Hypertension: - Continue Norvasc  5 mg daily.  Blood pressure is stable.   4.  Neuropathy in the legs: - Neuropathy with tingling on and off in the feet and toes is stable.  Continue Lyrica  twice daily.   5.  Abnormal mammogram: - Mammogram on 07/13/2023, BI-RADS Category 1.  6.  Hypomagnesemia: - Continue magnesium  twice daily.    Orders Placed This Encounter  Procedures   CT ABDOMEN PELVIS W CONTRAST    Standing Status:   Future    Expected Date:   07/30/2023    Expiration Date:   07/22/2024    If indicated for the ordered procedure, I authorize the administration of contrast media per Radiology protocol:   Yes    Does the patient have a contrast media/X-ray dye allergy?:   No    Preferred imaging location?:   [facility]    Release to patient:   Immediate [1]    If indicated for the ordered procedure, I authorize the administration of oral contrast media per Radiology protocol:   Yes      I,Helena R Teague,acting as a scribe for [name], MD.,have documented all relevant documentation on the behalf of [name], MD,as directed by  [name], MD while in the presence of [name], MD.  I, [name] MD, have reviewed the above documentation for accuracy and completeness, and I agree with the above.      [name], MD   7/3/20251:00 PM  CHIEF COMPLAINT:   Diagnosis: recurrent endometrial cancer    Cancer Staging  No matching staging information was found for the patient.    Prior Therapy: 1. TAH & BSO, LNDs on 04/13/2018 2. Carboplatin /Paclitaxel , 12/29/18 - 10/05/19 3. Maintenance Paclitaxel , last dose on 08/22/2021  Current Therapy:  surveillance    HISTORY OF PRESENT ILLNESS:   Oncology History  Endometrial cancer (HCC)   03/31/2018 Initial Diagnosis   Endometrial cancer (HCC)   12/29/2018 -  Chemotherapy   Patient is on Treatment Plan : UTERINE Carboplatin  AUC 6 / Paclitaxel  q21d     06/06/2020 Miscellaneous   Summary of oncologic history from Dr. Jurnie Garritano   1.  Recurrent endometrial carcinosarcoma: -TAH, BSO, bilateral pelvic and para-aortic lymph node resections on 04/13/2018. -Pathology showed carcinosarcoma (malignant mixed mullerian tumor) is arising in an endometrial type polyp with no myometrial invasion identified.  High-grade.  0/39 lymph nodes positive.  PT1APN0, FIGO stage Ia.  MMR normal.  MSI-stable. -CTAP on 12/09/2018 for abdominal pain showed extensive peritoneal carcinomatosis and large soft tissue mass in the pelvis. -Biopsy of the omental mass on 12/28/2018 shows poorly differentiated carcinoma consistent with her prior malignancy. -Carboplatin  and paclitaxel  started on 12/29/2018. -CT scan on 05/02/2019 showed peritoneal implants in the low central small bowel mesentery and left pelvic sidewall have decreased in size measuring 1.5 x 1.9 cm and 1.7 x 2.2 cm.  Soft tissue nodule in the left lateral omentum measures 1 x 1.7 cm with no evidence of metastatic disease. -Continuation of chemotherapy until complete  response was recommended. -CT AP on 07/04/2019 showed continued positive response to therapy with scattered peritoneal metastasis in the pelvis and left omentum decreased in size.  No new metastatic disease. -CTAP on 10/24/2019 after 14 cycles showed substantial reduction in size of soft tissue nodules in the pelvis.  1.4 x 0.9 x 1 cm soft tissue density in the inferior serosal margin of the sigmoid colon, previously 1.7 x 1.1 x 1.7 cm.  2 small soft tissue nodules in the mesenteric adipose tissue above the urinary bladder measures 0.8 cm. -She has developed serious allergic reaction during cycle 14 with carboplatin . -I have discussed with Dr. Eloy.  Other options include adding ifosfamide to  Taxol  or Doxil and combination of lenvatinib with pembrolizumab.  In the lenvatinib pembrolizumab trial carcinosarcomas were not included. -Single agent paclitaxel  started on 11/09/2019, dose reduced by 20% on 01/18/2020. -CTAP on 01/10/2020 with stable small peritoneal soft tissue nodules in the pelvis.  Resolution of soft tissue nodularity in the left abdominal omental fat with no new or progressive disease.     06/20/2020 Imaging   1. Stable examination status post hysterectomy without suspicious enhancing soft tissue nodularity at the vaginal cuff and no significant change in the multiple small soft tissue nodules in the pelvis likely representing treated tumor. No new or progressive findings. 2. Cholelithiasis including a large gallstone in the neck of the gallbladder but without CT evidence of acute cholecystitis. 3. Small hiatal hernia. 4. Aortic atherosclerosis.        INTERVAL HISTORY:   Corrin is a 63 y.o. female presenting to clinic today for follow up of recurrent endometrial cancer. She was last seen by me on 06/18/23.  Today, she states that she is doing well overall. Her appetite level is at 100%. Her energy level is at 25%. Yaslene reports being sick at the visit today.   She notes abdominal distention and LLQ abdominal pain that has slightly worsened since her last visit with me. She also reports low back pain, as well as on and off diarrhea and nausea.   Razia will be on vacation from 08/05/23 to 08/10/23 to visit her children. She would like to receive one dose of chemotherapy before leaving for vacation.   Her neuropathy currently consists of intermittent tingling in the toes and tingling in the fingertips when sleeping. She is taking Lyrica  BID, Norvasc , and magnesium .   PAST MEDICAL HISTORY:   Past Medical History: Past Medical History:  Diagnosis Date   Allergy    Anemia    Anxiety    Arthritis    Cancer (HCC)    Phreesia 09/05/2019   Cancer of skin of back     Chest pain    Depression    Elevated blood pressure reading    Endometrial cancer (HCC)    Hyperlipidemia    Hypertension    PMB (postmenopausal bleeding)    Port-A-Cath in place 12/24/2018   Pre-diabetes     Surgical History: Past Surgical History:  Procedure Laterality Date   ABDOMINAL HYSTERECTOMY N/A    Phreesia 09/05/2019   IR IMAGING GUIDED PORT INSERTION  12/28/2018   IR RADIOLOGIST EVAL & MGMT  06/02/2018   IR RADIOLOGIST EVAL & MGMT  06/16/2018   NECK SURGERY  2000   spinal surgery , titanium rod in place    ROBOTIC ASSISTED TOTAL HYSTERECTOMY WITH BILATERAL SALPINGO OOPHERECTOMY N/A 04/13/2018   Procedure: XI ROBOTIC ASSISTED TOTAL HYSTERECTOMY WITH BILATERAL SALPINGO OOPHORECTOMY;  Surgeon: Eloy Herring, MD;  Location: [facility];  Service: Gynecology;  Laterality: N/A;   ROBOTIC PELVIC AND PARA-AORTIC LYMPH NODE DISSECTION N/A 04/13/2018   Procedure: XI ROBOTIC PELVIC LYMPHADECTOMY AND PARA-AORTIC LYMPH NODE DISSECTION;  Surgeon: Eloy Herring, MD;  Location: [facility];  Service: Gynecology;  Laterality: N/A;   TOTAL HIP ARTHROPLASTY Right 09/19/2022   Procedure: RIGHT TOTAL HIP ARTHROPLASTY ANTERIOR APPROACH;  Surgeon: Vernetta Lonni GRADE, MD;  Location: [facility];  Service: Orthopedics;  Laterality: Right;    Social History: Social History   Socioeconomic History   Marital status: Widowed    Spouse name: Not on file   Number of children: 3   Years of education: Not on file   Highest education level: 12th grade  Occupational History   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  Tobacco Use   Smoking status: Never   Smokeless tobacco: Never  Vaping Use   Vaping status: Never Used  Substance and Sexual Activity   Alcohol use: Yes    Alcohol/week: 4.0 standard drinks of alcohol    Types: 4 Glasses of wine per week    Comment: weekends   Drug use: Never   Sexual activity: Not Currently  Other Topics Concern   Not on file  Social History Narrative      Wears  seat belt    Does not use phone while driving    Smoke detectors at home   Fire extinguisher-no   No weapons   Social Drivers of Health   Financial Resource Strain: Low Risk  (07/03/2023)   Overall Financial Resource Strain (CARDIA)    Difficulty of Paying Living Expenses: Not very hard  Food Insecurity: No Food Insecurity (07/03/2023)   Hunger Vital Sign    Worried About Running Out of Food in the Last Year: Never true    Ran Out of Food in the Last Year: Never true  Transportation Needs: No Transportation Needs (07/03/2023)   PRAPARE - Administrator, Civil Service (Medical): No    Lack of Transportation (Non-Medical): No  Physical Activity: Sufficiently Active (07/03/2023)   Exercise Vital Sign    Days of Exercise per Week: 3 days    Minutes of Exercise per Session: 50 min  Stress: No Stress Concern Present (07/03/2023)   [employer] of Occupational Health - Occupational Stress Questionnaire    Feeling of Stress: Only a little  Recent Concern: Stress - Stress Concern Present (04/15/2023)   [employer] of Occupational Health - Occupational Stress Questionnaire    Feeling of Stress : To some extent  Social Connections: Socially Isolated (07/03/2023)   Social Connection and Isolation Panel    Frequency of Communication with Friends and Family: Three times a week    Frequency of Social Gatherings with Friends and Family: Never    Attends Religious Services: Never    Database administrator or Organizations: No    Attends Engineer, structural: Not on file    Marital Status: Widowed  Intimate Partner Violence: Not At Risk (04/15/2023)   Humiliation, Afraid, Rape, and Kick questionnaire    Fear of Current or Ex-Partner: No    Emotionally Abused: No    Physically Abused: No    Sexually Abused: No    Family History: Family History  Problem Relation Age of Onset   Hypertension Mother    Breast cancer Mother    Hypertension Father    Heart disease  Father    Cancer Father  lung   Cancer Sister        ovarian cancer   Melanoma Sister     Current Medications:  Current Outpatient Medications:    amLODipine  (NORVASC ) 5 MG tablet, Take 1 tablet by mouth once daily, Disp: 90 tablet, Rfl: 0   atorvastatin  (LIPITOR) 20 MG tablet, Take 1 tablet by mouth once daily, Disp: 90 tablet, Rfl: 0   Cholecalciferol  (VITAMIN D3) 25 MCG (1000 UT) CAPS, Take 1 capsule (1,000 Units total) by mouth daily., Disp: 60 capsule, Rfl: 2   lidocaine -prilocaine  (EMLA ) cream, Apply to port site prior to use, Disp: 30 g, Rfl: 0   meclizine  (ANTIVERT ) 25 MG tablet, Take 1 tablet (25 mg total) by mouth 3 (three) times daily as needed for dizziness., Disp: 30 tablet, Rfl: 3   methocarbamol  (ROBAXIN ) 500 MG tablet, Take 1 tablet (500 mg total) by mouth every 6 (six) hours as needed for muscle spasms., Disp: 40 tablet, Rfl: 1   nitroGLYCERIN  (NITROSTAT ) 0.4 MG SL tablet, Place 1 tablet (0.4 mg total) under the tongue every 5 (five) minutes as needed for chest pain., Disp: 25 tablet, Rfl: 3   PACLITAXEL  IV, Inject into the vein every 21 ( twenty-one) days., Disp: , Rfl:    pregabalin  (LYRICA ) 75 MG capsule, TAKE 1 CAPSULE BY MOUTH THREE TIMES DAILY, Disp: 90 capsule, Rfl: 3   sertraline  (ZOLOFT ) 100 MG tablet, Take 1 tablet by mouth once daily, Disp: 30 tablet, Rfl: 0   MAGnesium -Oxide 400 (240 Mg) MG tablet, Take 1 tablet (400 mg total) by mouth 2 (two) times daily., Disp: 60 tablet, Rfl: 3   prochlorperazine  (COMPAZINE ) 10 MG tablet, Take 1 tablet (10 mg total) by mouth every 6 (six) hours as needed (Nausea or vomiting)., Disp: 60 tablet, Rfl: 3 No current facility-administered medications for this visit.  Facility-Administered Medications Ordered in Other Visits:    diphenhydrAMINE  (BENADRYL ) 50 MG/ML injection, , , ,    palonosetron  (ALOXI ) 0.25 MG/5ML injection, , , ,    Allergies: Allergies  Allergen Reactions   Carboplatin  Other (See Comments)    body  on fire and abdominal pain   Nickel Rash    itchy    REVIEW OF SYSTEMS:   Review of Systems  Constitutional:  Negative for chills, fatigue and fever.  HENT:   Negative for lump/mass, mouth sores, nosebleeds, sore throat and trouble swallowing.   Eyes:  Negative for eye problems.  Respiratory:  Positive for cough and shortness of breath.   Cardiovascular:  Negative for chest pain, leg swelling and palpitations.  Gastrointestinal:  Positive for abdominal pain (2/10 severity), diarrhea and nausea. Negative for constipation and vomiting.  Genitourinary:  Negative for bladder incontinence, difficulty urinating, dysuria, frequency, hematuria and nocturia.   Musculoskeletal:  Negative for arthralgias, back pain, flank pain, myalgias and neck pain.  Skin:  Negative for itching and rash.  Neurological:  Positive for dizziness and headaches. Negative for numbness.       +tingling in toes  Hematological:  Does not bruise/bleed easily.  Psychiatric/Behavioral:  Positive for depression. Negative for sleep disturbance and suicidal ideas. The patient is nervous/anxious.   All other systems reviewed and are negative.    VITALS:   Blood pressure 123/74, pulse 79, temperature 98.3 F (36.8 C), temperature source Oral, resp. rate 18, weight 225 lb 8.5 oz (102.3 kg), SpO2 96%.  Wt Readings from Last 3 Encounters:  07/23/23 225 lb 8.5 oz (102.3 kg)  07/09/23 228 lb 12.8 oz (103.8 kg)  07/07/23 228 lb 9.6 oz (103.7 kg)    Body mass index is 38.71 kg/m.  Performance status (ECOG): 1 - Symptomatic but completely ambulatory  PHYSICAL EXAM:   Physical Exam Vitals and nursing note reviewed. Exam conducted with a chaperone present.  Constitutional:      Appearance: Normal appearance.  Cardiovascular:     Rate and Rhythm: Normal rate and regular rhythm.     Pulses: Normal pulses.     Heart sounds: Normal heart sounds.  Pulmonary:     Effort: Pulmonary effort is normal.     Breath sounds: Normal  breath sounds.  Abdominal:     Palpations: Abdomen is soft. There is no hepatomegaly, splenomegaly or mass.     Tenderness: There is no abdominal tenderness.  Musculoskeletal:     Right lower leg: No edema.     Left lower leg: No edema.  Lymphadenopathy:     Cervical: No cervical adenopathy.     Right cervical: No superficial, deep or posterior cervical adenopathy.    Left cervical: No superficial, deep or posterior cervical adenopathy.     Upper Body:     Right upper body: No supraclavicular or axillary adenopathy.     Left upper body: No supraclavicular or axillary adenopathy.  Neurological:     General: No focal deficit present.     Mental Status: She is alert and oriented to person, place, and time.  Psychiatric:        Mood and Affect: Mood normal.        Behavior: Behavior normal.     LABS:   CBC     Component Value Date/Time   WBC 7.2 07/07/2023 1027   WBC 7.0 05/18/2023 0824   RBC 5.01 07/07/2023 1027   RBC 4.88 05/18/2023 0824   HGB 13.3 07/07/2023 1027   HCT 42.7 07/07/2023 1027   PLT 305 07/07/2023 1027   MCV 85 07/07/2023 1027   MCH 26.5 (L) 07/07/2023 1027   MCH 26.8 05/18/2023 0824   MCHC 31.1 (L) 07/07/2023 1027   MCHC 32.3 05/18/2023 0824   RDW 15.3 07/07/2023 1027   LYMPHSABS 2.6 07/07/2023 1027   MONOABS 0.6 05/18/2023 0824   EOSABS 0.2 07/07/2023 1027   BASOSABS 0.1 07/07/2023 1027    CMP      Component Value Date/Time   NA 145 (H) 07/07/2023 1027   K 4.8 07/07/2023 1027   CL 105 07/07/2023 1027   CO2 22 07/07/2023 1027   GLUCOSE 104 (H) 07/07/2023 1027   GLUCOSE 115 (H) 05/18/2023 0824   BUN 14 07/07/2023 1027   CREATININE 0.78 07/07/2023 1027   CALCIUM  10.0 07/07/2023 1027   PROT 7.0 07/07/2023 1027   ALBUMIN 4.4 07/07/2023 1027   AST 23 07/07/2023 1027   ALT 20 07/07/2023 1027   ALKPHOS 145 (H) 07/07/2023 1027   BILITOT 0.3 07/07/2023 1027   GFRNONAA >60 05/18/2023 0824   GFRAA >60 10/05/2019 0825     No results found for:  CEA1, CEA / No results found for: CEA1, CEA No results found for: PSA1 No results found for: CAN199 Lab Results  Component Value Date   CAN125 6.3 04/06/2023    No results found for: TOTALPROTELP, ALBUMINELP, A1GS, A2GS, BETS, BETA2SER, GAMS, MSPIKE, SPEI No results found for: TIBC, FERRITIN, IRONPCTSAT Lab Results  Component Value Date   LDH 160 02/09/2019   LDH 331 (H) 12/22/2018     STUDIES:   MM 3D SCREENING MAMMOGRAM BILATERAL BREAST Result Date: 07/14/2023  CLINICAL DATA:  Screening. EXAM: DIGITAL SCREENING BILATERAL MAMMOGRAM WITH TOMOSYNTHESIS AND CAD TECHNIQUE: Bilateral screening digital craniocaudal and mediolateral oblique mammograms were obtained. Bilateral screening digital breast tomosynthesis was performed. The images were evaluated with computer-aided detection. COMPARISON:  Previous exam(s). ACR Breast Density Category b: There are scattered areas of fibroglandular density. FINDINGS: There are no findings suspicious for malignancy. IMPRESSION: No mammographic evidence of malignancy. A result letter of this screening mammogram will be mailed directly to the patient. RECOMMENDATION: Screening mammogram in one year. (Code:SM-B-01Y) BI-RADS CATEGORY  1: Negative. Electronically Signed   By: Inocente Ast M.D.   On: 07/14/2023 15:57

## 2023-07-23 NOTE — Patient Instructions (Signed)
 [facility] at [facility] Discharge Instructions   You were seen and examined today by Dr. Rogers.  We will proceed with your treatment next week.   Return as scheduled.    Thank you for choosing [facility] at [facility] to provide your oncology and hematology care.  To afford each patient quality time with our provider, please arrive at least 15 minutes before your scheduled appointment time.   If you have a lab appointment with the Cancer Center please come in thru the Main Entrance and check in at the main information desk.  You need to re-schedule your appointment should you arrive 10 or more minutes late.  We strive to give you quality time with our providers, and arriving late affects you and other patients whose appointments are after yours.  Also, if you no show three or more times for appointments you may be dismissed from the clinic at the providers discretion.     Again, thank you for choosing [facility].  Our hope is that these requests will decrease the amount of time that you wait before being seen by our physicians.       _____________________________________________________________  Should you have questions after your visit to [facility], please contact our office at [phone] and follow the prompts.  Our office hours are 8:00 a.m. and 4:30 p.m. Monday - Friday.  Please note that voicemails left after 4:00 p.m. may not be returned until the following business day.  We are closed weekends and major holidays.  You do have access to a nurse 24-7, just call the main number to the clinic [phone] and do not press any options, hold on the line and a nurse will answer the phone.    For prescription refill requests, have your pharmacy contact our office and allow 72 hours.    Due to Covid, you will need to wear a mask upon entering the hospital. If you do not have a mask, a mask will be given to  you at the Main Entrance upon arrival. For doctor visits, patients may have 1 support person age 18 or older with them. For treatment visits, patients can not have anyone with them due to social distancing guidelines and our immunocompromised population.

## 2023-07-25 ENCOUNTER — Ambulatory Visit
Admission: EM | Admit: 2023-07-25 | Discharge: 2023-07-25 | Disposition: A | Discharge status: Home / Self Care | Attending: Nurse Practitioner | Admitting: Nurse Practitioner

## 2023-07-25 DIAGNOSIS — U071 COVID-19: Secondary | ICD-10-CM | POA: Diagnosis not present

## 2023-07-25 LAB — POC SARS CORONAVIRUS 2 AG -  ED: SARS Coronavirus 2 Ag: POSITIVE — AB

## 2023-07-25 MED ORDER — PAXLOVID (300/100) 20 X 150 MG & 10 X 100MG PO TBPK: 3.0000 | ORAL_TABLET | Freq: Two times a day (BID) | ORAL | 0 refills | Status: AC

## 2023-07-25 MED ORDER — FLUTICASONE PROPIONATE 50 MCG/ACT NA SUSP: 2.0000 | Freq: Every day | NASAL | 0 refills | Status: DC

## 2023-07-25 MED ORDER — PROMETHAZINE-DM 6.25-15 MG/5ML PO SYRP: 5.0000 mL | ORAL_SOLUTION | Freq: Four times a day (QID) | ORAL | 0 refills | Status: DC | PRN

## 2023-07-25 MED ORDER — LIDOCAINE VISCOUS HCL 2 % MT SOLN: OROMUCOSAL | 0 refills | Status: DC

## 2023-07-25 NOTE — ED Provider Notes (Signed)
 [facility]    CSN: [id number] Arrival date & time: 07/25/23  9162      History   Chief Complaint Chief Complaint  Patient presents with   Cough    HPI Jessica [name] is a 63 y.o. female.   The history is provided by the patient.   Patient presents with a 3-day history of fatigue, body aches, sore throat, headache, cough, and diarrhea.  She also complains of a sore area in the right side of her upper mouth.  Patient denies fever, chills, wheezing, difficulty breathing, chest pain, abdominal pain, nausea, vomiting, or rash.  Patient states she has been using over-the-counter cough and cold medications along with Chloraseptic throat spray for symptoms.  Patient denies any obvious known sick contacts.  States that she does go to [employer] quite often.  Patient states she was scheduled to start chemotherapy next week.  Past Medical History:  Diagnosis Date   Allergy    Anemia    Anxiety    Arthritis    Cancer (HCC)    Phreesia 09/05/2019   Cancer of skin of back    Chest pain    Depression    Elevated blood pressure reading    Endometrial cancer (HCC)    Hyperlipidemia    Hypertension    PMB (postmenopausal bleeding)    Port-A-Cath in place 12/24/2018   Pre-diabetes     Patient Active Problem List   Diagnosis Date Noted   Obesity (BMI 30-39.9) 07/07/2023   Breast cancer screening by mammogram 07/07/2023   Need for Tdap vaccination 01/08/2023   BPPV (benign paroxysmal positional vertigo) 01/08/2023   Status post total replacement of right hip 09/19/2022   Cardiac chest pain 04/10/2022   Cholelithiasis 12/20/2021   Vitamin D  deficiency 06/18/2021   Prediabetes 06/18/2021   Annual physical exam 06/18/2021   Impaired vision in both eyes 06/18/2021   Breast tenderness in female 06/18/2021   Headache 02/11/2021   Morbid obesity (HCC) 01/28/2021   Insomnia 01/28/2021   SIRS (systemic inflammatory response syndrome) (HCC) 12/07/2020   Hypotension     Peripheral neuropathy due to chemotherapy (HCC) 06/27/2020   Anemia due to antineoplastic chemotherapy 06/27/2020   Depression, major, single episode, moderate (HCC) 09/07/2019   Anxiety 09/07/2019   Port-A-Cath in place 12/24/2018   Goals of care, counseling/discussion 12/23/2018   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HCC) 12/14/2018   Elevated LFTs 05/05/2018   Hyperlipidemia 04/27/2018   Essential hypertension 04/26/2018   Endometrial cancer (HCC) 03/31/2018   H/O total hysterectomy 03/2018    Past Surgical History:  Procedure Laterality Date   ABDOMINAL HYSTERECTOMY N/A    [employer]reesia 09/05/2019   IR IMAGING GUIDED PORT INSERTION  12/28/2018   IR RADIOLOGIST EVAL & MGMT  06/02/2018   IR RADIOLOGIST EVAL & MGMT  06/16/2018   NECK SURGERY  2000   spinal surgery , titanium rod in place    ROBOTIC ASSISTED TOTAL HYSTERECTOMY WITH BILATERAL SALPINGO OOPHERECTOMY N/A 04/13/2018   Procedure: XI ROBOTIC ASSISTED TOTAL HYSTERECTOMY WITH BILATERAL SALPINGO OOPHORECTOMY;  Surgeon: Eloy Herring, MD;  Location: [facility];  Service: Gynecology;  Laterality: N/A;   ROBOTIC PELVIC AND PARA-AORTIC LYMPH NODE DISSECTION N/A 04/13/2018   Procedure: XI ROBOTIC PELVIC LYMPHADECTOMY AND PARA-AORTIC LYMPH NODE DISSECTION;  Surgeon: Eloy Herring, MD;  Location: [facility];  Service: Gynecology;  Laterality: N/A;   TOTAL HIP ARTHROPLASTY Right 09/19/2022   Procedure: RIGHT TOTAL HIP ARTHROPLASTY ANTERIOR APPROACH;  Surgeon: Vernetta Lonni GRADE,  MD;  Location: [facility];  Service: Orthopedics;  Laterality: Right;    OB History     Gravida  4   Para  3   Term  3   Preterm  0   AB  1   Living  3      SAB  0   IAB  1   Ectopic  0   Multiple  0   Live Births  3            Home Medications    Prior to Admission medications   Medication Sig Start Date End Date Taking? Authorizing Provider  amLODipine  (NORVASC ) 5 MG tablet Take 1 tablet by mouth once daily 06/29/23   Dixon,  Phillip E, MD  atorvastatin  (LIPITOR) 20 MG tablet Take 1 tablet by mouth once daily 06/29/23   Dixon, Phillip E, MD  Cholecalciferol  (VITAMIN D3) 25 MCG (1000 UT) CAPS Take 1 capsule (1,000 Units total) by mouth daily. 06/18/21   Paseda, Folashade R, FNP  lidocaine -prilocaine  (EMLA ) cream Apply to port site prior to use 10/06/22   [name], MD  MAGnesium -Oxide 400 (240 Mg) MG tablet Take 1 tablet (400 mg total) by mouth 2 (two) times daily. 07/23/23   [name], MD  meclizine  (ANTIVERT ) 25 MG tablet Take 1 tablet (25 mg total) by mouth 3 (three) times daily as needed for dizziness. 01/08/23   Melvenia Manus BRAVO, MD  methocarbamol  (ROBAXIN ) 500 MG tablet Take 1 tablet (500 mg total) by mouth every 6 (six) hours as needed for muscle spasms. 09/20/22   Vernetta Lonni GRADE, MD  nitroGLYCERIN  (NITROSTAT ) 0.4 MG SL tablet Place 1 tablet (0.4 mg total) under the tongue every 5 (five) minutes as needed for chest pain. 04/10/22 09/03/23  Mallipeddi, Vishnu P, MD  PACLITAXEL  IV Inject into the vein every 21 ( twenty-one) days. 12/29/18   [provider]  pregabalin  (LYRICA ) 75 MG capsule TAKE 1 CAPSULE BY MOUTH THREE TIMES DAILY 06/22/23   [name], MD  prochlorperazine  (COMPAZINE ) 10 MG tablet Take 1 tablet (10 mg total) by mouth every 6 (six) hours as needed (Nausea or vomiting). 07/23/23   [name], MD  sertraline  (ZOLOFT ) 100 MG tablet Take 1 tablet by mouth once daily 07/03/23   Melvenia Manus BRAVO, MD    Family History Family History  Problem Relation Age of Onset   Hypertension Mother    Breast cancer Mother    Hypertension Father    Heart disease Father    Cancer Father        lung   Cancer Sister        ovarian cancer   Melanoma Sister     Social History Social History   Tobacco Use   Smoking status: Never   Smokeless tobacco: Never  Vaping Use   Vaping status: Never Used  Substance Use Topics   Alcohol use: Yes    Alcohol/week: 4.0  standard drinks of alcohol    Types: 4 Glasses of wine per week    Comment: weekends   Drug use: Never     Allergies   Carboplatin  and Nickel   Review of Systems Review of Systems Per HPI  Physical Exam Triage Vital Signs ED Triage Vitals [07/25/23 0920]  Encounter Vitals Group     BP (!) 146/87     Girls Systolic BP Percentile      Girls Diastolic BP Percentile      Boys Systolic BP Percentile  Boys Diastolic BP Percentile      Pulse Rate 81     Resp 20     Temp 98.7 F (37.1 C)     Temp Source Oral     SpO2 95 %     Weight      Height      Head Circumference      Peak Flow      Pain Score 2     Pain Loc      Pain Education      Exclude from Growth Chart    No data found.  Updated Vital Signs BP (!) 146/87 (BP Location: Right Arm)   Pulse 81   Temp 98.7 F (37.1 C) (Oral)   Resp 20   SpO2 95%   Visual Acuity Right Eye Distance:   Left Eye Distance:   Bilateral Distance:    Right Eye Near:   Left Eye Near:    Bilateral Near:     Physical Exam Vitals and nursing note reviewed.  Constitutional:      General: She is not in acute distress.    Appearance: Normal appearance.  HENT:     Head: Normocephalic.     Right Ear: Ear canal and external ear normal. A middle ear effusion is present.     Left Ear: Tympanic membrane, ear canal and external ear normal.     Nose: Congestion present.     Right Turbinates: Enlarged and swollen.     Left Turbinates: Enlarged and swollen.     Right Sinus: No maxillary sinus tenderness or frontal sinus tenderness.     Left Sinus: No maxillary sinus tenderness or frontal sinus tenderness.     Mouth/Throat:     Lips: Pink.     Mouth: Mucous membranes are moist.     Pharynx: Uvula midline. Posterior oropharyngeal erythema and postnasal drip present. No pharyngeal swelling, oropharyngeal exudate or uvula swelling.      Comments: Cobblestoning present to posterior oropharynx  Eyes:     Extraocular Movements:  Extraocular movements intact.     Conjunctiva/sclera: Conjunctivae normal.     Pupils: Pupils are equal, round, and reactive to light.  Cardiovascular:     Rate and Rhythm: Normal rate and regular rhythm.     Pulses: Normal pulses.     Heart sounds: Normal heart sounds.  Pulmonary:     Effort: Pulmonary effort is normal. No respiratory distress.     Breath sounds: Normal breath sounds. No stridor. No wheezing, rhonchi or rales.  Abdominal:     General: Bowel sounds are normal.     Palpations: Abdomen is soft.     Tenderness: There is no abdominal tenderness.  Musculoskeletal:     Cervical back: Normal range of motion.  Skin:    General: Skin is warm and dry.  Neurological:     General: No focal deficit present.     Mental Status: She is alert and oriented to person, place, and time.  Psychiatric:        Mood and Affect: Mood normal.        Behavior: Behavior normal.      UC Treatments / Results  Labs (all labs ordered are listed, but only abnormal results are displayed) Labs Reviewed  POC SARS CORONAVIRUS 2 AG -  ED - Abnormal; Notable for the following components:      Result Value   SARS Coronavirus 2 Ag Positive (*)    All other components within normal  limits    EKG   Radiology No results found.  Procedures Procedures (including critical care time)  Medications Ordered in UC Medications - No data to display  Initial Impression / Assessment and Plan / UC Course  I have reviewed the triage vital signs and the nursing notes.  Pertinent labs & imaging results that were available during my care of the patient were reviewed by me and considered in my medical decision making (see chart for details).  COVID test was positive.  Will start patient on Paxlovid  for antiviral treatment.  Symptomatic treatment provided with fluticasone  50 micro nasal spray for nasal congestion and runny nose, Promethazine  DM for her cough, and viscous lidocaine  2% for patient to gargle  and spit for mouth pain and throat discomfort.  Supportive care recommendations were provided and discussed with the patient to include fluids, rest, over-the-counter analgesics, normal saline nasal spray, use of a humidifier at nighttime during sleep.  Patient was advised to hold her Lipitor for 2 weeks due to taking Paxlovid .  Patient also advised to follow-up with her oncologist to determine when she may be able to start her chemotherapy.  Patient was given strict ER follow-up precautions.  Patient was in agreement with this plan of care and verbalizes understanding.  All questions were answered.  Patient stable for discharge.  Final Clinical Impressions(s) / UC Diagnoses   Final diagnoses:  None   Discharge Instructions   None    ED Prescriptions   None    PDMP not reviewed this encounter.   Gilmer Etta PARAS, NP 07/25/23 1029

## 2023-07-25 NOTE — ED Triage Notes (Signed)
 Reports a cough with mucus, sore throat, headache, diarrhea x 3 days

## 2023-07-25 NOTE — Discharge Instructions (Signed)
 Your COVID test was positive today. Take medication as prescribed.  As discussed, you will need to hold your atorvastatin  (Lipitor) for 2 weeks. Increase fluids and allow for plenty of rest. You may take over-the-counter Tylenol  as needed for pain, fever, or general discomfort. May use normal saline nasal spray throughout the day for nasal congestion and runny nose. Recommend warm salt water  gargles 3-4 times daily as needed for throat pain or discomfort.  You may also continue Chloraseptic throat spray and throat lozenges for throat pain or discomfort. Recommend use of a humidifier in your bedroom at nighttime during sleep and sleeping elevated on pillows while cough symptoms persist. If you develop a fever, you will need to remain home until you have been fever free for 24 hours with no medication. You will take the medication, Paxlovid , for the next 5 days.  While you are taking this medication, you will need to wear your mask.  If you continue to have symptoms after completing the medication, wear your mask for an additional 5 days. Go to the emergency department immediately if you experience shortness of breath, difficulty breathing, or other concerns. Please follow-up with your oncologist next week to determine when you may be able to start chemotherapy. Follow-up as needed.

## 2023-07-27 ENCOUNTER — Inpatient Hospital Stay

## 2023-07-27 DIAGNOSIS — Z95828 Presence of other vascular implants and grafts: Secondary | ICD-10-CM

## 2023-07-27 DIAGNOSIS — C541 Malignant neoplasm of endometrium: Secondary | ICD-10-CM

## 2023-07-28 ENCOUNTER — Other Ambulatory Visit

## 2023-07-28 ENCOUNTER — Ambulatory Visit

## 2023-08-01 ENCOUNTER — Ambulatory Visit (HOSPITAL_BASED_OUTPATIENT_CLINIC_OR_DEPARTMENT_OTHER)

## 2023-08-03 ENCOUNTER — Ambulatory Visit (INDEPENDENT_AMBULATORY_CARE_PROVIDER_SITE_OTHER): Payer: 59 | Admitting: Orthopaedic Surgery

## 2023-08-03 ENCOUNTER — Other Ambulatory Visit (INDEPENDENT_AMBULATORY_CARE_PROVIDER_SITE_OTHER)

## 2023-08-03 DIAGNOSIS — Z96641 Presence of right artificial hip joint: Secondary | ICD-10-CM

## 2023-08-03 NOTE — Progress Notes (Signed)
 The patient is a 63 year old well-known to us .  She is getting close to year out from a right total hip arthroplasty secondary to severe right hip arthritis.  She says the hip is doing great she reports good range of motion and strength.  She is going to be starting chemotherapy soon as it relates to recurrent uterine cancer.  She has had a hysterectomy but did have some malignancy.  Chemotherapy is upcoming for her.  She says the hip is doing well she has no issues with the right or left hip.  Her gait is improved when I have her walk around.  Her right hip moves smoothly and fluidly as is her left hip.  An AP pelvis shows a well-seated right total hip arthroplasty.  The left hip joint space is well maintained.  This point follow-up for hips can be as needed.  I wish her the best and hopefully speedy recovery as it relates to her cancer and chemotherapy treatments.  All questions and concerns were addressed and answered.

## 2023-08-05 ENCOUNTER — Other Ambulatory Visit: Payer: Self-pay | Admitting: Internal Medicine

## 2023-08-05 DIAGNOSIS — F419 Anxiety disorder, unspecified: Secondary | ICD-10-CM

## 2023-08-05 DIAGNOSIS — F321 Major depressive disorder, single episode, moderate: Secondary | ICD-10-CM

## 2023-08-06 ENCOUNTER — Ambulatory Visit: Admitting: Internal Medicine

## 2023-08-11 ENCOUNTER — Ambulatory Visit (HOSPITAL_BASED_OUTPATIENT_CLINIC_OR_DEPARTMENT_OTHER)
Admission: RE | Admit: 2023-08-11 | Discharge: 2023-08-11 | Disposition: A | Discharge status: Home / Self Care | Source: Ambulatory Visit | Attending: Hematology | Admitting: Hematology

## 2023-08-11 DIAGNOSIS — C541 Malignant neoplasm of endometrium: Secondary | ICD-10-CM | POA: Diagnosis present

## 2023-08-11 MED ORDER — IOHEXOL 300 MG/ML  SOLN
100.0000 mL | Freq: Once | INTRAMUSCULAR | Status: AC | PRN
  Administered 2023-08-11: 100 mL via INTRAVENOUS

## 2023-08-17 ENCOUNTER — Encounter (HOSPITAL_COMMUNITY): Payer: Self-pay

## 2023-08-18 ENCOUNTER — Inpatient Hospital Stay

## 2023-08-18 ENCOUNTER — Other Ambulatory Visit: Payer: Self-pay | Admitting: *Deleted

## 2023-08-18 ENCOUNTER — Inpatient Hospital Stay (HOSPITAL_BASED_OUTPATIENT_CLINIC_OR_DEPARTMENT_OTHER): Admitting: Hematology

## 2023-08-18 VITALS — BP 153/82 | HR 61 | Temp 98.1°F | Resp 16 | Wt 228.8 lb

## 2023-08-18 VITALS — BP 121/62 | HR 59 | Temp 97.9°F | Resp 19

## 2023-08-18 DIAGNOSIS — I10 Essential (primary) hypertension: Secondary | ICD-10-CM | POA: Diagnosis not present

## 2023-08-18 DIAGNOSIS — Z5111 Encounter for antineoplastic chemotherapy: Secondary | ICD-10-CM | POA: Diagnosis not present

## 2023-08-18 DIAGNOSIS — R1032 Left lower quadrant pain: Secondary | ICD-10-CM | POA: Diagnosis not present

## 2023-08-18 DIAGNOSIS — C541 Malignant neoplasm of endometrium: Secondary | ICD-10-CM

## 2023-08-18 DIAGNOSIS — Z95828 Presence of other vascular implants and grafts: Secondary | ICD-10-CM | POA: Diagnosis not present

## 2023-08-18 DIAGNOSIS — C775 Secondary and unspecified malignant neoplasm of intrapelvic lymph nodes: Secondary | ICD-10-CM | POA: Diagnosis present

## 2023-08-18 DIAGNOSIS — C786 Secondary malignant neoplasm of retroperitoneum and peritoneum: Secondary | ICD-10-CM | POA: Diagnosis present

## 2023-08-18 DIAGNOSIS — R928 Other abnormal and inconclusive findings on diagnostic imaging of breast: Secondary | ICD-10-CM | POA: Diagnosis not present

## 2023-08-18 DIAGNOSIS — Z8542 Personal history of malignant neoplasm of other parts of uterus: Secondary | ICD-10-CM | POA: Diagnosis not present

## 2023-08-18 DIAGNOSIS — G629 Polyneuropathy, unspecified: Secondary | ICD-10-CM | POA: Diagnosis not present

## 2023-08-18 DIAGNOSIS — Z90710 Acquired absence of both cervix and uterus: Secondary | ICD-10-CM | POA: Diagnosis not present

## 2023-08-18 DIAGNOSIS — Z79899 Other long term (current) drug therapy: Secondary | ICD-10-CM | POA: Diagnosis not present

## 2023-08-18 LAB — COMPREHENSIVE METABOLIC PANEL WITH GFR
ALT: 21 U/L (ref 0–44)
AST: 21 U/L (ref 15–41)
Albumin: 3.7 g/dL (ref 3.5–5.0)
Alkaline Phosphatase: 114 U/L (ref 38–126)
Anion gap: 12 (ref 5–15)
BUN: 12 mg/dL (ref 6–20)
CO2: 24 mmol/L (ref 22–32)
Calcium: 9.2 mg/dL (ref 8.9–10.3)
Chloride: 105 mmol/L (ref 98–111)
Creatinine, Ser: 0.79 mg/dL (ref 0.44–1.00)
GFR, Estimated: >60 mL/min (ref >60)
Glucose, Bld: 115 mg/dL — ABNORMAL HIGH (ref 70–99)
Potassium: 3.6 mmol/L (ref 3.5–5.1)
Sodium: 141 mmol/L (ref 135–145)
Total Bilirubin: 0.8 mg/dL (ref 0.0–1.2)
Total Protein: 7 g/dL (ref 6.5–8.1)

## 2023-08-18 LAB — CBC WITH DIFFERENTIAL/PLATELET
Abs Immature Granulocytes: 0.01 K/uL (ref 0.00–0.07)
Basophils Absolute: 0.1 K/uL (ref 0.0–0.1)
Basophils Relative: 1 %
Eosinophils Absolute: 0.2 K/uL (ref 0.0–0.5)
Eosinophils Relative: 4 %
HCT: 40.2 % (ref 36.0–46.0)
Hemoglobin: 13 g/dL (ref 12.0–15.0)
Immature Granulocytes: 0 %
Lymphocytes Relative: 38 %
Lymphs Abs: 2.6 K/uL (ref 0.7–4.0)
MCH: 27.8 pg (ref 26.0–34.0)
MCHC: 32.3 g/dL (ref 30.0–36.0)
MCV: 86.1 fL (ref 80.0–100.0)
Monocytes Absolute: 0.5 K/uL (ref 0.1–1.0)
Monocytes Relative: 7 %
Neutro Abs: 3.5 K/uL (ref 1.7–7.7)
Neutrophils Relative %: 50 %
Platelets: 258 K/uL (ref 150–400)
RBC: 4.67 MIL/uL (ref 3.87–5.11)
RDW: 16.1 % — ABNORMAL HIGH (ref 11.5–15.5)
WBC: 6.9 K/uL (ref 4.0–10.5)
nRBC: 0 % (ref 0.0–0.2)

## 2023-08-18 LAB — MAGNESIUM: Magnesium: 1.8 mg/dL (ref 1.7–2.4)

## 2023-08-18 MED ORDER — PALONOSETRON HCL INJECTION 0.25 MG/5ML
0.2500 mg | Freq: Once | INTRAVENOUS | Status: AC
  Administered 2023-08-18: 0.25 mg via INTRAVENOUS
  Filled 2023-08-18: qty 5

## 2023-08-18 MED ORDER — DEXAMETHASONE SODIUM PHOSPHATE 10 MG/ML IJ SOLN
10.0000 mg | Freq: Once | INTRAMUSCULAR | Status: AC
  Administered 2023-08-18: 10 mg via INTRAVENOUS
  Filled 2023-08-18: qty 1

## 2023-08-18 MED ORDER — SODIUM CHLORIDE 0.9 % IV SOLN
Freq: Once | INTRAVENOUS | Status: AC
Start: 2023-08-18 — End: 2023-08-18

## 2023-08-18 MED ORDER — SODIUM CHLORIDE 0.9 % IV SOLN
140.0000 mg/m2 | Freq: Once | INTRAVENOUS | Status: AC
  Administered 2023-08-18: 306 mg via INTRAVENOUS
  Filled 2023-08-18: qty 51

## 2023-08-18 MED ORDER — FAMOTIDINE IN NACL 20-0.9 MG/50ML-% IV SOLN
20.0000 mg | Freq: Once | INTRAVENOUS | Status: AC
  Administered 2023-08-18: 20 mg via INTRAVENOUS
  Filled 2023-08-18: qty 50

## 2023-08-18 MED ORDER — CETIRIZINE HCL 10 MG/ML IV SOLN
10.0000 mg | Freq: Once | INTRAVENOUS | Status: AC
  Administered 2023-08-18: 10 mg via INTRAVENOUS
  Filled 2023-08-18: qty 1

## 2023-08-18 MED ORDER — SODIUM CHLORIDE 0.9% FLUSH
10.0000 mL | INTRAVENOUS | Status: DC | PRN
  Administered 2023-08-18: 10 mL

## 2023-08-18 MED ORDER — HEPARIN SOD (PORK) LOCK FLUSH 100 UNIT/ML IV SOLN
500.0000 [IU] | Freq: Once | INTRAVENOUS | Status: AC | PRN
  Administered 2023-08-18: 500 [IU]

## 2023-08-18 NOTE — Progress Notes (Signed)
 Patient presents today for chemotherapy infusion. Patient is in satisfactory condition with no new complaints voiced.  Vital signs are stable.  Labs reviewed by Dr. Rogers during the office visit and all labs are within treatment parameters.  We will proceed with treatment per MD orders.   1123:  Patient's BP prior to Taxol  was 100/44.  BP was 98/39 at five minute recheck.  Patient is asymptomatic.  Patient had IV Zyrtec  and has been sleeping.  MD made aware. No further treatment at this time.   Patient tolerated treatment well with no complaints voiced.  Patient left ambulatory in stable condition.  Vital signs stable at discharge.  Follow up as scheduled.

## 2023-08-18 NOTE — Patient Instructions (Addendum)
 [facility] at [facility] Discharge Instructions   You were seen and examined today by Dr. Rogers.  He reviewed the results of your lab work which are normal/stable.   He reviewed the results of your CT scan which shows the left iliac lymph node has grown in size by 5 mm.   We will proceed with your treatment today.   Return as scheduled.    Thank you for choosing [facility] at [facility] to provide your oncology and hematology care.  To afford each patient quality time with our provider, please arrive at least 15 minutes before your scheduled appointment time.   If you have a lab appointment with the Cancer Center please come in thru the Main Entrance and check in at the main information desk.  You need to re-schedule your appointment should you arrive 10 or more minutes late.  We strive to give you quality time with our providers, and arriving late affects you and other patients whose appointments are after yours.  Also, if you no show three or more times for appointments you may be dismissed from the clinic at the providers discretion.     Again, thank you for choosing [facility].  Our hope is that these requests will decrease the amount of time that you wait before being seen by our physicians.       _____________________________________________________________  Should you have questions after your visit to [facility], please contact our office at [phone] and follow the prompts.  Our office hours are 8:00 a.m. and 4:30 p.m. Monday - Friday.  Please note that voicemails left after 4:00 p.m. may not be returned until the following business day.  We are closed weekends and major holidays.  You do have access to a nurse 24-7, just call the main number to the clinic [phone] and do not press any options, hold on the line and a nurse will answer the phone.    For prescription refill requests, have your  pharmacy contact our office and allow 72 hours.    Due to Covid, you will need to wear a mask upon entering the hospital. If you do not have a mask, a mask will be given to you at the Main Entrance upon arrival. For doctor visits, patients may have 1 support person age 16 or older with them. For treatment visits, patients can not have anyone with them due to social distancing guidelines and our immunocompromised population.

## 2023-08-18 NOTE — Progress Notes (Signed)
 [facility] [address], [place] [address]    Clinic Day:  08/18/23   Referring physician: [name], FNP  Patient Care Team: [name], FNP as PCP - General (Family Medicine) Mallipeddi, [name], MD as PCP - Cardiology (Cardiology) Wilson, [name], RN as Oncology Nurse Navigator [name], MD as Medical Oncologist (Oncology)   ASSESSMENT & PLAN:   Assessment: 1.  Recurrent endometrial carcinosarcoma: -TAH, BSO, bilateral pelvic and para-aortic lymph node resections on 04/13/2018. -Pathology showed carcinosarcoma (malignant mixed mullerian tumor) is arising in an endometrial type polyp with no myometrial invasion identified.  High-grade.  0/39 lymph nodes positive.  PT1APN0, FIGO stage Ia.  MMR normal.  MSI-stable. -CTAP on 12/09/2018 for abdominal pain showed extensive peritoneal carcinomatosis and large soft tissue mass in the pelvis. -Biopsy of the omental mass on 12/28/2018 shows poorly differentiated carcinoma consistent with her prior malignancy. -Carboplatin  and paclitaxel  started on 12/29/2018. -CT scan on 05/02/2019 showed peritoneal implants in the low central small bowel mesentery and left pelvic sidewall have decreased in size measuring 1.5 x 1.9 cm and 1.7 x 2.2 cm.  Soft tissue nodule in the left lateral omentum measures 1 x 1.7 cm with no evidence of metastatic disease. -Continuation of chemotherapy until complete response was recommended. -CT AP on 07/04/2019 showed continued positive response to therapy with scattered peritoneal metastasis in the pelvis and left omentum decreased in size.  No new metastatic disease. -CTAP on 10/24/2019 after 14 cycles showed substantial reduction in size of soft tissue nodules in the pelvis.  1.4 x 0.9 x 1 cm soft tissue density in the inferior serosal margin of the sigmoid colon, previously 1.7 x 1.1 x 1.7 cm.  2 small soft tissue nodules in the mesenteric adipose tissue above the urinary bladder  measures 0.8 cm. -She has developed serious allergic reaction during cycle 14 with carboplatin . -I have discussed with Dr. Eloy.  Other options include adding ifosfamide to Taxol  or Doxil and combination of lenvatinib with pembrolizumab.  In the lenvatinib pembrolizumab trial carcinosarcomas were not included. -Single agent paclitaxel  started on 11/09/2019, dose reduced by 20% on 01/18/2020.  Paclitaxel  held on 08/22/2021 as a chemo holiday. -CTAP on 01/10/2020 with stable small peritoneal soft tissue nodules in the pelvis.  Resolution of soft tissue nodularity in the left abdominal omental fat with no new or progressive disease. - Guardant360 (04/20/2023): MPL [id number].  No other targetable mutations. - Left external iliac lymph node biopsy (05/17/2020): Carcinosarcoma - NGS testing: Not enough tissue.  HER2 by IHC (1+).    Plan: 1.  Recurrent endometrial carcinosarcoma: - CT AP (08/11/2023): Left external iliac lymph node slightly increased in size measuring 16 mm, previously 11 mm.  Similar soft tissue nodularity along the right vaginal cuff measuring 2.1 cm.  No other new findings. - We reviewed HER2 IHC results which were negative for Enhertu. - She had COVID infection about 3 weeks ago and has recovered from it. - We reviewed labs from 08/18/2023: Normal LFTs send electrolytes.  CBC was normal. - She will start back on paclitaxel  at reduced dose of 140 mg/m every 3 weeks. - Plan to repeat CTAP after 3-4 cycles.  If the left external iliac lymph node improved significantly, she may be eligible for another chemotherapy holiday.   2.  Left lower quadrant pain: - She does report left lower quadrant bloating sensation which is new.  Will closely monitor.   3.  Hypertension: - Continue Norvasc  5 mg  daily.  Blood pressure is stable at 150/80.   4.  Neuropathy in the legs: - Neuropathy with tingling on and off in the feet and toes has been stable. - Continue Lyrica  twice daily.  Will closely  monitor worsening of neuropathy as we started back on paclitaxel .   5.  Abnormal mammogram: - Mammogram on 07/12/2020, BI-RADS Category 1.  6.  Hypomagnesemia: - She will continue magnesium  twice daily.  Magnesium  is normal.    Orders Placed This Encounter  Procedures   CBC with Differential    Standing Status:   Future    Expected Date:   09/07/2023    Expiration Date:   09/06/2024   Comprehensive metabolic panel    Standing Status:   Future    Expected Date:   09/07/2023    Expiration Date:   09/06/2024   Magnesium     Standing Status:   Future    Expected Date:   09/07/2023    Expiration Date:   09/06/2024      LILLETTE Hummingbird R Teague,acting as a scribe for [name], MD.,have documented all relevant documentation on the behalf of [name], MD,as directed by  [name], MD while in the presence of [name], MD.  I, [name] MD, have reviewed the above documentation for accuracy and completeness, and I agree with the above.      [name], MD   7/29/20253:17 PM  CHIEF COMPLAINT:   Diagnosis: recurrent endometrial cancer    Cancer Staging  No matching staging information was found for the patient.    Prior Therapy: 1. TAH & BSO, LNDs on 04/13/2018 2. Carboplatin /Paclitaxel , 12/29/18 - 10/05/19 3. Maintenance Paclitaxel , last dose on 08/22/2021  Current Therapy: Paclitaxel    HISTORY OF PRESENT ILLNESS:   Oncology History  Endometrial cancer (HCC)  03/31/2018 Initial Diagnosis   Endometrial cancer (HCC)   12/29/2018 -  Chemotherapy   Patient is on Treatment Plan : UTERINE Carboplatin  AUC 6 / Paclitaxel  q21d     06/06/2020 Miscellaneous   Summary of oncologic history from Dr. Azile Minardi   1.  Recurrent endometrial carcinosarcoma: -TAH, BSO, bilateral pelvic and para-aortic lymph node resections on 04/13/2018. -Pathology showed carcinosarcoma (malignant mixed mullerian tumor) is arising in an endometrial type  polyp with no myometrial invasion identified.  High-grade.  0/39 lymph nodes positive.  PT1APN0, FIGO stage Ia.  MMR normal.  MSI-stable. -CTAP on 12/09/2018 for abdominal pain showed extensive peritoneal carcinomatosis and large soft tissue mass in the pelvis. -Biopsy of the omental mass on 12/28/2018 shows poorly differentiated carcinoma consistent with her prior malignancy. -Carboplatin  and paclitaxel  started on 12/29/2018. -CT scan on 05/02/2019 showed peritoneal implants in the low central small bowel mesentery and left pelvic sidewall have decreased in size measuring 1.5 x 1.9 cm and 1.7 x 2.2 cm.  Soft tissue nodule in the left lateral omentum measures 1 x 1.7 cm with no evidence of metastatic disease. -Continuation of chemotherapy until complete response was recommended. -CT AP on 07/04/2019 showed continued positive response to therapy with scattered peritoneal metastasis in the pelvis and left omentum decreased in size.  No new metastatic disease. -CTAP on 10/24/2019 after 14 cycles showed substantial reduction in size of soft tissue nodules in the pelvis.  1.4 x 0.9 x 1 cm soft tissue density in the inferior serosal margin of the sigmoid colon, previously 1.7 x 1.1 x 1.7 cm.  2 small soft tissue nodules in the mesenteric adipose tissue above the urinary bladder measures 0.8 cm. -  She has developed serious allergic reaction during cycle 14 with carboplatin . -I have discussed with Dr. Eloy.  Other options include adding ifosfamide to Taxol  or Doxil and combination of lenvatinib with pembrolizumab.  In the lenvatinib pembrolizumab trial carcinosarcomas were not included. -Single agent paclitaxel  started on 11/09/2019, dose reduced by 20% on 01/18/2020. -CTAP on 01/10/2020 with stable small peritoneal soft tissue nodules in the pelvis.  Resolution of soft tissue nodularity in the left abdominal omental fat with no new or progressive disease.     06/20/2020 Imaging   1. Stable examination status post  hysterectomy without suspicious enhancing soft tissue nodularity at the vaginal cuff and no significant change in the multiple small soft tissue nodules in the pelvis likely representing treated tumor. No new or progressive findings. 2. Cholelithiasis including a large gallstone in the neck of the gallbladder but without CT evidence of acute cholecystitis. 3. Small hiatal hernia. 4. Aortic atherosclerosis.        INTERVAL HISTORY:   Jessica [name] is a 63 y.o. female presenting to clinic today for follow up of recurrent endometrial cancer. She was last seen by me on 07/23/2023.  Since her last visit, she underwent CT AP on 08/11/2023 that found:  Increased size of the left external iliac lymph node now measuring 16 mm in short axis previously 11 mm, concerning for progressive nodal disease. Similar soft tissue nodularity along the right vaginal cuff measuring 2.1 cm previously 2.2 cm. Cholelithiasis. Small hiatal hernia. Aortic atherosclerosis.  Charna was seen in the ED on 07/25/2023 for a COVID infection with symptoms of a sore throat and headaches.   Today, she states that she is doing well overall. Her appetite level is at 100%. Her energy level is at 50-75%.  Her neuropathy is stable, presenting as occasional tingling in toes.  She denies any abdominal pain, though she states she has a hew areas of pressure on the abdomen. Yzabelle notes bloating in the LLQ and RUQ.  Tersa states when she was first diagnosed with endometrial cancer, she had moderate to severe bilateral leg pain and was prescribed oxycodone  q8h prn. She would like to be prescribed this medication in case it will be needed in the future.   Neuropathy presenting as on and off tingling in the toes is stable and she is taking Lyrica  as prescribed. She is also taking magnesium  as prescribed.   PAST MEDICAL HISTORY:   Past Medical History: Past Medical History:  Diagnosis Date   Allergy    Anemia    Anxiety    Arthritis    Cancer  (HCC)    Phreesia 09/05/2019   Cancer of skin of back    Chest pain    Depression    Elevated blood pressure reading    Endometrial cancer (HCC)    Hyperlipidemia    Hypertension    PMB (postmenopausal bleeding)    Port-A-Cath in place 12/24/2018   Pre-diabetes     Surgical History: Past Surgical History:  Procedure Laterality Date   ABDOMINAL HYSTERECTOMY N/A    Phreesia 09/05/2019   IR IMAGING GUIDED PORT INSERTION  12/28/2018   IR RADIOLOGIST EVAL & MGMT  06/02/2018   IR RADIOLOGIST EVAL & MGMT  06/16/2018   NECK SURGERY  2000   spinal surgery , titanium rod in place    ROBOTIC ASSISTED TOTAL HYSTERECTOMY WITH BILATERAL SALPINGO OOPHERECTOMY N/A 04/13/2018   Procedure: XI ROBOTIC ASSISTED TOTAL HYSTERECTOMY WITH BILATERAL SALPINGO OOPHORECTOMY;  Surgeon: Eloy Herring, MD;  Location: [facility];  Service: Gynecology;  Laterality: N/A;   ROBOTIC PELVIC AND PARA-AORTIC LYMPH NODE DISSECTION N/A 04/13/2018   Procedure: XI ROBOTIC PELVIC LYMPHADECTOMY AND PARA-AORTIC LYMPH NODE DISSECTION;  Surgeon: Eloy Herring, MD;  Location: [facility];  Service: Gynecology;  Laterality: N/A;   TOTAL HIP ARTHROPLASTY Right 09/19/2022   Procedure: RIGHT TOTAL HIP ARTHROPLASTY ANTERIOR APPROACH;  Surgeon: Vernetta Lonni GRADE, MD;  Location: [facility];  Service: Orthopedics;  Laterality: Right;    Social History: Social History   Socioeconomic History   Marital status: Widowed    Spouse name: Not on file   Number of children: 3   Years of education: Not on file   Highest education level: 12th grade  Occupational History   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  Tobacco Use   Smoking status: Never   Smokeless tobacco: Never  Vaping Use   Vaping status: Never Used  Substance and Sexual Activity   Alcohol use: Yes    Alcohol/week: 4.0 standard drinks of alcohol    Types: 4 Glasses of wine per week    Comment: weekends   Drug use: Never   Sexual activity: Not Currently  Other Topics  Concern   Not on file  Social History Narrative      Wears seat belt    Does not use phone while driving    Smoke detectors at home   Fire extinguisher-no   No weapons   Social Drivers of Health   Financial Resource Strain: Low Risk  (07/03/2023)   Overall Financial Resource Strain (CARDIA)    Difficulty of Paying Living Expenses: Not very hard  Food Insecurity: No Food Insecurity (07/03/2023)   Hunger Vital Sign    Worried About Running Out of Food in the Last Year: Never true    Ran Out of Food in the Last Year: Never true  Transportation Needs: No Transportation Needs (07/03/2023)   PRAPARE - Administrator, Civil Service (Medical): No    Lack of Transportation (Non-Medical): No  Physical Activity: Sufficiently Active (07/03/2023)   Exercise Vital Sign    Days of Exercise per Week: 3 days    Minutes of Exercise per Session: 50 min  Stress: No Stress Concern Present (07/03/2023)   [employer] of Occupational Health - Occupational Stress Questionnaire    Feeling of Stress: Only a little  Recent Concern: Stress - Stress Concern Present (04/15/2023)   [employer] of Occupational Health - Occupational Stress Questionnaire    Feeling of Stress : To some extent  Social Connections: Socially Isolated (07/03/2023)   Social Connection and Isolation Panel    Frequency of Communication with Friends and Family: Three times a week    Frequency of Social Gatherings with Friends and Family: Never    Attends Religious Services: Never    Database administrator or Organizations: No    Attends Engineer, structural: Not on file    Marital Status: Widowed  Intimate Partner Violence: Not At Risk (04/15/2023)   Humiliation, Afraid, Rape, and Kick questionnaire    Fear of Current or Ex-Partner: No    Emotionally Abused: No    Physically Abused: No    Sexually Abused: No    Family History: Family History  Problem Relation Age of Onset   Hypertension Mother     Breast cancer Mother    Hypertension Father    Heart disease Father    Cancer Father  lung   Cancer Sister        ovarian cancer   Melanoma Sister     Current Medications:  Current Outpatient Medications:    amLODipine  (NORVASC ) 5 MG tablet, Take 1 tablet by mouth once daily, Disp: 90 tablet, Rfl: 0   atorvastatin  (LIPITOR) 20 MG tablet, Take 1 tablet by mouth once daily, Disp: 90 tablet, Rfl: 0   Cholecalciferol  (VITAMIN D3) 25 MCG (1000 UT) CAPS, Take 1 capsule (1,000 Units total) by mouth daily., Disp: 60 capsule, Rfl: 2   lidocaine -prilocaine  (EMLA ) cream, Apply to port site prior to use, Disp: 30 g, Rfl: 0   MAGnesium -Oxide 400 (240 Mg) MG tablet, Take 1 tablet (400 mg total) by mouth 2 (two) times daily., Disp: 60 tablet, Rfl: 3   meclizine  (ANTIVERT ) 25 MG tablet, Take 1 tablet (25 mg total) by mouth 3 (three) times daily as needed for dizziness., Disp: 30 tablet, Rfl: 3   methocarbamol  (ROBAXIN ) 500 MG tablet, Take 1 tablet (500 mg total) by mouth every 6 (six) hours as needed for muscle spasms., Disp: 40 tablet, Rfl: 1   nitroGLYCERIN  (NITROSTAT ) 0.4 MG SL tablet, Place 1 tablet (0.4 mg total) under the tongue every 5 (five) minutes as needed for chest pain., Disp: 25 tablet, Rfl: 3   PACLITAXEL  IV, Inject into the vein every 21 ( twenty-one) days., Disp: , Rfl:    pregabalin  (LYRICA ) 75 MG capsule, TAKE 1 CAPSULE BY MOUTH THREE TIMES DAILY, Disp: 90 capsule, Rfl: 3   prochlorperazine  (COMPAZINE ) 10 MG tablet, Take 1 tablet (10 mg total) by mouth every 6 (six) hours as needed (Nausea or vomiting)., Disp: 60 tablet, Rfl: 3   sertraline  (ZOLOFT ) 100 MG tablet, Take 1 tablet by mouth once daily, Disp: 30 tablet, Rfl: 0 No current facility-administered medications for this visit.  Facility-Administered Medications Ordered in Other Visits:    diphenhydrAMINE  (BENADRYL ) 50 MG/ML injection, , , ,    heparin  lock flush 100 unit/mL, 500 Units, Intracatheter, Once PRN, Avin Gibbons,  Laurabelle Gorczyca, MD   palonosetron  (ALOXI ) 0.25 MG/5ML injection, , , ,    sodium chloride  flush (NS) 0.9 % injection 10 mL, 10 mL, Intracatheter, PRN, Anwita Mencer, MD   Allergies: Allergies  Allergen Reactions   Carboplatin  Other (See Comments)    body on fire and abdominal pain   Nickel Rash    itchy    REVIEW OF SYSTEMS:   Review of Systems  Constitutional:  Negative for chills, fatigue and fever.  HENT:   Negative for lump/mass, mouth sores, nosebleeds, sore throat and trouble swallowing.   Eyes:  Negative for eye problems.  Respiratory:  Negative for cough and shortness of breath.   Cardiovascular:  Negative for chest pain, leg swelling and palpitations.  Gastrointestinal:  Positive for diarrhea and nausea. Negative for abdominal pain, constipation and vomiting.  Genitourinary:  Negative for bladder incontinence, difficulty urinating, dysuria, frequency, hematuria and nocturia.   Musculoskeletal:  Negative for arthralgias, back pain, flank pain, myalgias and neck pain.  Skin:  Negative for itching and rash.  Neurological:  Negative for dizziness, headaches and numbness.  Hematological:  Does not bruise/bleed easily.  Psychiatric/Behavioral:  Negative for depression, sleep disturbance and suicidal ideas. The patient is not nervous/anxious.   All other systems reviewed and are negative.    VITALS:   Blood pressure (!) 153/82, pulse 61, temperature 98.1 F (36.7 C), temperature source Oral, resp. rate 16, weight 228 lb 13.4 oz (103.8 kg), SpO2 97%.  Wt Readings  from Last 3 Encounters:  08/18/23 228 lb 13.4 oz (103.8 kg)  07/23/23 225 lb 8.5 oz (102.3 kg)  07/09/23 228 lb 12.8 oz (103.8 kg)    Body mass index is 39.28 kg/m.  Performance status (ECOG): 1 - Symptomatic but completely ambulatory  PHYSICAL EXAM:   Physical Exam Vitals and nursing note reviewed. Exam conducted with a chaperone present.  Constitutional:      Appearance: Normal appearance.   Cardiovascular:     Rate and Rhythm: Normal rate and regular rhythm.     Pulses: Normal pulses.     Heart sounds: Normal heart sounds.  Pulmonary:     Effort: Pulmonary effort is normal.     Breath sounds: Normal breath sounds.  Abdominal:     Palpations: Abdomen is soft. There is no hepatomegaly, splenomegaly or mass.     Tenderness: There is no abdominal tenderness.  Musculoskeletal:     Right lower leg: No edema.     Left lower leg: No edema.  Lymphadenopathy:     Cervical: No cervical adenopathy.     Right cervical: No superficial, deep or posterior cervical adenopathy.    Left cervical: No superficial, deep or posterior cervical adenopathy.     Upper Body:     Right upper body: No supraclavicular or axillary adenopathy.     Left upper body: No supraclavicular or axillary adenopathy.  Neurological:     General: No focal deficit present.     Mental Status: She is alert and oriented to person, place, and time.  Psychiatric:        Mood and Affect: Mood normal.        Behavior: Behavior normal.     LABS:   CBC     Component Value Date/Time   WBC 6.9 08/18/2023 0745   RBC 4.67 08/18/2023 0745   HGB 13.0 08/18/2023 0745   HGB 13.3 07/07/2023 1027   HCT 40.2 08/18/2023 0745   HCT 42.7 07/07/2023 1027   PLT 258 08/18/2023 0745   PLT 305 07/07/2023 1027   MCV 86.1 08/18/2023 0745   MCV 85 07/07/2023 1027   MCH 27.8 08/18/2023 0745   MCHC 32.3 08/18/2023 0745   RDW 16.1 (H) 08/18/2023 0745   RDW 15.3 07/07/2023 1027   LYMPHSABS 2.6 08/18/2023 0745   LYMPHSABS 2.6 07/07/2023 1027   MONOABS 0.5 08/18/2023 0745   EOSABS 0.2 08/18/2023 0745   EOSABS 0.2 07/07/2023 1027   BASOSABS 0.1 08/18/2023 0745   BASOSABS 0.1 07/07/2023 1027    CMP      Component Value Date/Time   NA 141 08/18/2023 0745   NA 145 (H) 07/07/2023 1027   K 3.6 08/18/2023 0745   CL 105 08/18/2023 0745   CO2 24 08/18/2023 0745   GLUCOSE 115 (H) 08/18/2023 0745   BUN 12 08/18/2023 0745    BUN 14 07/07/2023 1027   CREATININE 0.79 08/18/2023 0745   CALCIUM  9.2 08/18/2023 0745   PROT 7.0 08/18/2023 0745   PROT 7.0 07/07/2023 1027   ALBUMIN 3.7 08/18/2023 0745   ALBUMIN 4.4 07/07/2023 1027   AST 21 08/18/2023 0745   ALT 21 08/18/2023 0745   ALKPHOS 114 08/18/2023 0745   BILITOT 0.8 08/18/2023 0745   BILITOT 0.3 07/07/2023 1027   GFRNONAA >60 08/18/2023 0745   GFRAA >60 10/05/2019 0825     No results found for: CEA1, CEA / No results found for: CEA1, CEA No results found for: PSA1 No results found for: [id number] Lab  Results  Component Value Date   CAN125 6.3 04/06/2023    No results found for: TOTALPROTELP, ALBUMINELP, A1GS, A2GS, BETS, BETA2SER, GAMS, MSPIKE, SPEI No results found for: TIBC, FERRITIN, IRONPCTSAT Lab Results  Component Value Date   LDH 160 02/09/2019   LDH 331 (H) 12/22/2018     STUDIES:   CT ABDOMEN PELVIS W CONTRAST Result Date: 08/16/2023 CLINICAL DATA:  Ovarian cancer, monitor.  * Tracking Code: BO *. EXAM: CT ABDOMEN AND PELVIS WITH CONTRAST TECHNIQUE: Multidetector CT imaging of the abdomen and pelvis was performed using the standard protocol following bolus administration of intravenous contrast. RADIATION DOSE REDUCTION: This exam was performed according to the departmental dose-optimization program which includes automated exposure control, adjustment of the mA and/or kV according to patient size and/or use of iterative reconstruction technique. CONTRAST:  OMNIPAQUE  IOHEXOL  300 MG/ML  SOLN COMPARISON:  Multiple priors including CT April 06, 2023 FINDINGS: Lower chest: No acute abnormality.  Small hiatal hernia. Hepatobiliary: No suspicious hepatic lesion. Cholelithiasis. No biliary ductal dilation. Pancreas: No pancreatic ductal dilation or evidence of acute inflammation. Spleen: No splenomegaly. Adrenals/Urinary Tract: No suspicious adrenal nodule/mass. No hydronephrosis. Kidneys demonstrate symmetric  enhancement. Urinary bladder is nondistended. Stomach/Bowel: Radiopaque enteric contrast material traverses the rectum. Small hiatal hernia. No pathologic dilation of small or large bowel. Non to pending is. Scattered colonic diverticulosis. Vascular/Lymphatic: Normal caliber abdominal aorta. Scattered aortic atherosclerosis. Smooth IVC contours. Increased size of the left external iliac lymph node now measuring 16 mm in short axis on image 72/2 previously 11 mm. Reproductive: Similar soft tissue nodularity along the right vaginal cuff measuring 2.1 cm on image 77/2 previously 2.2 cm. Other: No significant abdominopelvic free fluid. Musculoskeletal: No aggressive lytic or blastic lesion of bone. Right total hip arthroplasty. Multilevel degenerative changes spine. IMPRESSION: 1. Increased size of the left external iliac lymph node now measuring 16 mm in short axis previously 11 mm, concerning for progressive nodal disease. 2. Similar soft tissue nodularity along the right vaginal cuff measuring 2.1 cm previously 2.2 cm. 3. Cholelithiasis. 4. Small hiatal hernia. 5. Aortic atherosclerosis. Aortic Atherosclerosis (ICD10-I70.0). Electronically Signed   By: Reyes Holder M.D.   On: 08/16/2023 11:59   XR Pelvis 1-2 Views Result Date: 08/03/2023 An AP pelvis shows a well-seated right total hip arthroplasty with no complicating features.  The left hip joint space is well-maintained.

## 2023-08-18 NOTE — Patient Instructions (Signed)
 [facility] [place] - A DEPT OF [facility]  Discharge Instructions: Thank you for choosing [facility] to provide your oncology and hematology care.  If you have a lab appointment with the Cancer Center - please note that after April 8th, 2024, all labs will be drawn in the cancer center.  You do not have to check in or register with the main entrance as you have in the past but will complete your check-in in the cancer center.  Wear comfortable clothing and clothing appropriate for easy access to any Portacath or PICC line.   We strive to give you quality time with your provider. You may need to reschedule your appointment if you arrive late (15 or more minutes).  Arriving late affects you and other patients whose appointments are after yours.  Also, if you miss three or more appointments without notifying the office, you may be dismissed from the clinic at the provider's discretion.      For prescription refill requests, have your pharmacy contact our office and allow 72 hours for refills to be completed.    Today you received the following chemotherapy and/or immunotherapy agents Paclitaxel .  Paclitaxel  Injection What is this medication? PACLITAXEL  (PAK li TAX el) treats some types of cancer. It works by slowing down the growth of cancer cells. This medicine may be used for other purposes; ask your health care provider or pharmacist if you have questions. COMMON BRAND NAME(S): Onxol, Taxol  What should I tell my care team before I take this medication? They need to know if you have any of these conditions: Heart disease Liver disease Low white blood cell levels An unusual or allergic reaction to paclitaxel , other medications, foods, dyes, or preservatives If you or your partner are pregnant or trying to get pregnant Breast-feeding How should I use this medication? This medication is injected into a vein. It is given by your care team in a hospital  or clinic setting. Talk to your care team about the use of this medication in children. While it may be given to children for selected conditions, precautions do apply. Overdosage: If you think you have taken too much of this medicine contact a poison control center or emergency room at once. NOTE: This medicine is only for you. Do not share this medicine with others. What if I miss a dose? Keep appointments for follow-up doses. It is important not to miss your dose. Call your care team if you are unable to keep an appointment. What may interact with this medication? Do not take this medication with any of the following: Live virus vaccines Other medications may affect the way this medication works. Talk with your care team about all of the medications you take. They may suggest changes to your treatment plan to lower the risk of side effects and to make sure your medications work as intended. This list may not describe all possible interactions. Give your health care provider a list of all the medicines, herbs, non-prescription drugs, or dietary supplements you use. Also tell them if you smoke, drink alcohol, or use illegal drugs. Some items may interact with your medicine. What should I watch for while using this medication? Your condition will be monitored carefully while you are receiving this medication. You may need blood work while taking this medication. This medication may make you feel generally unwell. This is not uncommon as chemotherapy can affect healthy cells as well as cancer cells. Report any  side effects. Continue your course of treatment even though you feel ill unless your care team tells you to stop. This medication can cause serious allergic reactions. To reduce the risk, your care team may give you other medications to take before receiving this one. Be sure to follow the directions from your care team. This medication may increase your risk of getting an infection. Call your  care team for advice if you get a fever, chills, sore throat, or other symptoms of a cold or flu. Do not treat yourself. Try to avoid being around people who are sick. This medication may increase your risk to bruise or bleed. Call your care team if you notice any unusual bleeding. Be careful brushing or flossing your teeth or using a toothpick because you may get an infection or bleed more easily. If you have any dental work done, tell your dentist you are receiving this medication. Talk to your care team if you may be pregnant. Serious birth defects can occur if you take this medication during pregnancy. Talk to your care team before breastfeeding. Changes to your treatment plan may be needed. What side effects may I notice from receiving this medication? Side effects that you should report to your care team as soon as possible: Allergic reactions--skin rash, itching, hives, swelling of the face, lips, tongue, or throat Heart rhythm changes--fast or irregular heartbeat, dizziness, feeling faint or lightheaded, chest pain, trouble breathing Increase in blood pressure Infection--fever, chills, cough, sore throat, wounds that don't heal, pain or trouble when passing urine, general feeling of discomfort or being unwell Low blood pressure--dizziness, feeling faint or lightheaded, blurry vision Low red blood cell level--unusual weakness or fatigue, dizziness, headache, trouble breathing Painful swelling, warmth, or redness of the skin, blisters or sores at the infusion site Pain, tingling, or numbness in the hands or feet Slow heartbeat--dizziness, feeling faint or lightheaded, confusion, trouble breathing, unusual weakness or fatigue Unusual bruising or bleeding Side effects that usually do not require medical attention (report to your care team if they continue or are bothersome): Diarrhea Hair loss Joint pain Loss of appetite Muscle pain Nausea Vomiting This list may not describe all possible  side effects. Call your doctor for medical advice about side effects. You may report side effects to FDA at 1-800-FDA-1088. Where should I keep my medication? This medication is given in a hospital or clinic. It will not be stored at home. NOTE: This sheet is a summary. It may not cover all possible information. If you have questions about this medicine, talk to your doctor, pharmacist, or health care provider.  2024 Elsevier/Gold Standard (2021-05-28 00:00:00)       To help prevent nausea and vomiting after your treatment, we encourage you to take your nausea medication as directed.  BELOW ARE SYMPTOMS THAT SHOULD BE REPORTED IMMEDIATELY: *FEVER GREATER THAN 100.4 F (38 C) OR HIGHER *CHILLS OR SWEATING *NAUSEA AND VOMITING THAT IS NOT CONTROLLED WITH YOUR NAUSEA MEDICATION *UNUSUAL SHORTNESS OF BREATH *UNUSUAL BRUISING OR BLEEDING *URINARY PROBLEMS (pain or burning when urinating, or frequent urination) *BOWEL PROBLEMS (unusual diarrhea, constipation, pain near the anus) TENDERNESS IN MOUTH AND THROAT WITH OR WITHOUT PRESENCE OF ULCERS (sore throat, sores in mouth, or a toothache) UNUSUAL RASH, SWELLING OR PAIN  UNUSUAL VAGINAL DISCHARGE OR ITCHING   Items with * indicate a potential emergency and should be followed up as soon as possible or go to the Emergency Department if any problems should occur.  Please show the CHEMOTHERAPY ALERT  CARD or IMMUNOTHERAPY ALERT CARD at check-in to the Emergency Department and triage nurse.  Should you have questions after your visit or need to cancel or reschedule your appointment, please contact [facility] [place] - A DEPT OF [name] [phone]  and follow the prompts.  Office hours are 8:00 a.m. to 4:30 p.m. Monday - Friday. Please note that voicemails left after 4:00 p.m. may not be returned until the following business day.  We are closed weekends and major holidays. You have access to a nurse at all times for urgent  questions. Please call the main number to the clinic [phone] and follow the prompts.  For any non-urgent questions, you may also contact your provider using MyChart. We now offer e-Visits for anyone 21 and older to request care online for non-urgent symptoms. For details visit [email or web address].   Also download the MyChart app! Go to the app store, search MyChart, open the app, select , and log in with your MyChart username and password.

## 2023-08-18 NOTE — Progress Notes (Signed)
 Patient has been examined by Dr. Ellin Saba. Vital signs and labs have been reviewed by MD - ANC, Creatinine, LFTs, hemoglobin, and platelets are within treatment parameters per M.D. - pt may proceed with treatment.  Primary RN and pharmacy notified.

## 2023-08-19 ENCOUNTER — Encounter: Payer: Self-pay | Admitting: Hematology

## 2023-08-19 MED ORDER — OXYCODONE HCL 10 MG PO TABA: 10.0000 mg | ORAL_TABLET | Freq: Three times a day (TID) | ORAL | 0 refills | Status: DC | PRN

## 2023-09-06 NOTE — Progress Notes (Signed)
 Patient Care Team: [name], FNP as PCP - General (Family Medicine) Stacia [name], MD as PCP - Cardiology (Cardiology) Wilson, [name], RN as Oncology Nurse Navigator  Clinic Day:  09/07/2023  Referring physician: [name], FNP   CHIEF COMPLAINT:  CC: Recurrent endometrial carcinosarcoma   Jessica [name] 63 y.o. female was transferred to my care after her prior physician has left.   ASSESSMENT & PLAN:   Assessment & Plan: Jessica [name]  is a 63 y.o. 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  Continue with treatment if labs are within parameters - Labs reviewed today: CMP: Normal creatinine, normal LFTs, CBC: WNL - Will obtain a CT scan at 3 months from last scan that is due in October 2025 - Can consider chemotherapy holiday if there is a significant response to treatment.  Return to clinic in 6 weeks for follow-up Peripheral neuropathy due to chemotherapy (HCC) Neuropathy with tingling on and off in the feet and toes has been stable.  -Continue Lyrica  twice daily.   -Will closely monitor worsening of neuropathy as we started back on paclitaxel . Neoplasm related pain Leg pain associated with chemotherapy, not related to growth factor injections.  Prefers 5 mg pain medication due to sedation with 10 mg.  - Prescribe 5 mg pain medication for leg pain, twice daily as needed Nausea Significant nausea managed effectively with Compazine .  No constipation, occasional diarrhea noted.  - Continue Compazine  for nausea management.  Diarrhea, unspecified type Diarrhea occurs primarily during chemotherapy week, manageable.  Prefers  diarrhea over constipation.  - Continue Imodium as needed for diarrhea management.     The patient understands the plans discussed today and is in agreement with them.  She knows to contact our office if she develops concerns prior to her next appointment.  60 minutes of total time was spent for this patient encounter, including preparation, face-to-face counseling with the patient and coordination of care, physical exam, and documentation of the encounter. > 50% of the time was spent on counseling as documented under my assessment and plan.    Jessica [name],acting as a Neurosurgeon for Mickiel Dry, MD.,have documented all relevant documentation on the behalf of Mickiel Dry, MD,as directed by  Mickiel Dry, MD while in the presence of Mickiel Dry, MD.  I, Mickiel Dry MD, have reviewed the above documentation for accuracy and completeness, and I agree with the above.     Mickiel Dry, MD  [facility] [place] - A DEPT OF [name] [address] [place] [address] Dept: [phone] Dept Fax: [phone]   No orders of the defined types were placed in this encounter.    ONCOLOGY HISTORY:   I have reviewed her chart and materials related to her cancer extensively and collaborated history with the patient. Summary of oncologic history is as follows:   Diagnosis: Recurrent endometrial carcinosarcoma   -03/29/2018: Cervical biopsy at 5 o'clock, 7 o'clock, and 12 o'clock.  -Pathology: Carcinosarcoma (malignant mixed mullerian tumor). Much of the carcinomatous component has clear cell features. -04/13/2018: TAH, BSO, bilateral pelvic and para-aortic lymph node resections.  Pathology: High-grade carcinosarcoma (malignant mixed mullerian tumor) arising in an endometrial polyp with  no myometrial or serosal invasion identified. 0/39 lymph nodes positive. pT1a pN0, FIGO stage Ia. MMR preserved. MSI-stable.   -12/09/2018 CT AP: Fairly extensive aggressive and new abdominal/pelvic carcinomatosis consistent with recurrent endometrial cancer. Associated small volume abdominal/pelvic ascites. No findings for bowel involvement or bowel obstruction. -12/22/2018 CA 125 elevated at 127.0 -12/28/2018 Omental mass biopsy. Pathology: Poorly differentiated carcinoma.  -Comment: Immunohistochemistry is positive for PAX 8 and ER (weak). CK7, CK20,  TTF-1, CDX-2 and GATA-3 are negative. -12/29/2018-10/05/2019: Carboplatin  and paclitaxel . Patient developed severe allergic reaction during cycle 14 with carboplatin  and was discontinued -05/02/2019: CT CAP:  Interval response to therapy as evidenced by decrease in size of peritoneal metastases. -07/04/2019: CT AP: Continued positive response to therapy. Scattered peritoneal metastases in the pelvis and left omentum are decreased. No new or progressive metastatic disease in the abdomen or pelvis. -10/24/2019: CT AP: Substantial reduction in size of soft tissue nodules in the pelvis compatible with positive response to therapy. Faint stranding in the left omentum at site of prior nodularity. -11/09/2019-08/22/2021: Single agent paclitaxel  started,01/18/2020 paclitaxel  dose reduced by 20%,  treatment held on 08/22/2021 for chemo holiday -01/10/2020-09/30/2022: CT AP: Stable disease - 04/06/2023:CT AP: New defined 2.2 cm soft tissue density along the superior and right lateral margin of the vaginal cuff, suspicious for local recurrence. Increased size of 11 mm lymph node or peritoneal nodule adjacent to the left external iliac vessels, consistent with metastatic disease. Stable 3 mm nodule or lymph node in sigmoid mesocolon. -04/20/2023: [id number]: MPL [id number]. No other targetable mutations.  -05/18/2023: Left external iliac lymph node biopsy.  -Pathology: Poorly differentiated malignant neoplasm with dual cell population and areas of squamous differentiation.  -Comment: The epithelioid  component shows squamous differentiation and shows labeling for cytokeratin AE1/AE3, p63 and CK5/6.  The more spindled component shows focal labeling for p63 but is negative for cytokeratin AE1/AE3 and CK5/6.  Immunostain for PAX8 shows only weak nonspecific labeling in both components.  -05/18/2023: [id number]: Not enough tissue. HER2 by IHC (1+).  -08/11/2023: CT AP: Increased size of the left external iliac lymph node now measuring 16 mm in short axis previously 11 mm, concerning for progressive nodal disease. Similar soft tissue nodularity along the right vaginal cuff measuring 2.1 cm previously 2.2 cm. -08/18/2023- Current: Paclitaxel  140 mg/m every 3 weeks (restarted)   Current Treatment:  Paclitaxel  140 mg/m every 3 weeks  INTERVAL HISTORY:   Jessica [name] is here today for follow up and establish care with me for recurrent endometrial carcinosarcoma.  She experiences frequent nausea, which she manages effectively with Compazine  taken at the onset of symptoms.  She experiences diarrhea more frequently than constipation, particularly during the week of chemotherapy, and manages it with Imodium, which she finds effective. She prefers diarrhea over constipation.  She reports a sensation of pressure and mild pain on her left side, which she describes as not severe.  She experiences significant leg pain associated with chemotherapy, for which she was previously prescribed 10 mg oxycodone . However, she prefers 5 mg doses as the higher dose causes excessive sedation, leading to prolonged bed rest. She takes the 5 mg dose twice a day for four to five days post-chemotherapy.  She experiences tingling in her toes, which she describes as minimal.  She also reports persistent tiredness, which she attributes to her age and weight, and notes that her appetite remains good.   I have reviewed the past medical history, past surgical history, social history and family history with the patient  and  they are unchanged from previous note.  ALLERGIES:  is allergic to carboplatin  and nickel.  MEDICATIONS:  Current Outpatient Medications  Medication Sig Dispense Refill   Cholecalciferol  (VITAMIN D3) 25 MCG (1000 UT) CAPS Take 1 capsule (1,000 Units total) by mouth daily. 60 capsule 2   lidocaine -prilocaine  (EMLA ) cream Apply to port site prior to use 30 g 0   MAGnesium -Oxide 400 (240 Mg) MG tablet Take 1 tablet (400 mg total) by mouth 2 (two) times daily. 60 tablet 3   meclizine  (ANTIVERT ) 25 MG tablet Take 1 tablet (25 mg total) by mouth 3 (three) times daily as needed for dizziness. 30 tablet 3   methocarbamol  (ROBAXIN ) 500 MG tablet Take 1 tablet (500 mg total) by mouth every 6 (six) hours as needed for muscle spasms. 40 tablet 1   nitroGLYCERIN  (NITROSTAT ) 0.4 MG SL tablet Place 1 tablet (0.4 mg total) under the tongue every 5 (five) minutes as needed for chest pain. 25 tablet 3   PACLITAXEL  IV Inject into the vein every 21 ( twenty-one) days.     pregabalin  (LYRICA ) 75 MG capsule TAKE 1 CAPSULE BY MOUTH THREE TIMES DAILY 90 capsule 3   prochlorperazine  (COMPAZINE ) 10 MG tablet Take 1 tablet (10 mg total) by mouth every 6 (six) hours as needed (Nausea or vomiting). 60 tablet 3   sertraline  (ZOLOFT ) 100 MG tablet Take 1 tablet by mouth once daily 30 tablet 0   amLODipine  (NORVASC ) 5 MG tablet Take 1 tablet by mouth once daily 90 tablet 0   atorvastatin  (LIPITOR) 20 MG tablet Take 1 tablet by mouth once daily 90 tablet 0   oxyCODONE  (OXY IR/ROXICODONE ) 5 MG immediate release tablet Take 1 tablet (5 mg total) by mouth 2 (two) times daily as needed for severe pain (pain score 7-10). 60 tablet 0   No current facility-administered medications for this visit.   Facility-Administered Medications Ordered in Other Visits  Medication Dose Route Frequency Provider Last Rate Last Admin   diphenhydrAMINE  (BENADRYL ) 50 MG/ML injection            palonosetron  (ALOXI ) 0.25 MG/5ML injection              REVIEW OF SYSTEMS:   Constitutional: Denies fevers, chills or abnormal weight loss Eyes: Denies blurriness of vision Ears, nose, mouth, throat, and face: Denies mucositis or sore throat Respiratory: Denies cough, dyspnea or wheezes Cardiovascular: Denies palpitation, chest discomfort or lower extremity swelling Gastrointestinal:  Denies nausea, heartburn or change in bowel habits Skin: Denies abnormal skin rashes Lymphatics: Denies new lymphadenopathy or easy bruising Neurological:Denies numbness, tingling or new weaknesses Behavioral/Psych: Mood is stable, no new changes  All other systems were reviewed with the patient and are negative.   VITALS:  There were no vitals taken for this visit.  Wt Readings from Last 3 Encounters:  09/07/23 229 lb 15 oz (104.3 kg)  08/18/23 228 lb 13.4 oz (103.8 kg)  07/23/23 225 lb 8.5 oz (102.3 kg)    There is no height or weight on file to calculate BMI.  Performance status (ECOG): 2 - Symptomatic, <50% confined to bed  PHYSICAL EXAM:   GENERAL:alert, no distress and comfortable SKIN: skin color, texture, turgor are normal, no rashes or significant lesions LYMPH:  no palpable lymphadenopathy in the cervical, axillary or inguinal LUNGS: clear to auscultation and percussion with normal breathing effort HEART: regular rate & rhythm and no murmurs and no lower extremity edema ABDOMEN:abdomen soft, non-tender and normal bowel sounds Musculoskeletal:no  cyanosis of digits and no clubbing  NEURO: alert & oriented x 3 with fluent speech, no focal motor/sensory deficits  LABORATORY DATA:  I have reviewed the data as listed    Component Value Date/Time   NA 140 09/07/2023 1206   NA 145 (H) 07/07/2023 1027   K 3.9 09/07/2023 1206   CL 106 09/07/2023 1206   CO2 20 (L) 09/07/2023 1206   GLUCOSE 112 (H) 09/07/2023 1206   BUN 14 09/07/2023 1206   BUN 14 07/07/2023 1027   CREATININE 0.58 09/07/2023 1206   CALCIUM  9.2 09/07/2023 1206   PROT 7.2  09/07/2023 1206   PROT 7.0 07/07/2023 1027   ALBUMIN 3.9 09/07/2023 1206   ALBUMIN 4.4 07/07/2023 1027   AST 19 09/07/2023 1206   ALT 20 09/07/2023 1206   ALKPHOS 110 09/07/2023 1206   BILITOT 0.5 09/07/2023 1206   BILITOT 0.3 07/07/2023 1027   GFRNONAA >60 09/07/2023 1206   GFRAA >60 10/05/2019 0825    Lab Results  Component Value Date   WBC 7.0 09/07/2023   NEUTROABS 3.9 09/07/2023   HGB 13.7 09/07/2023   HCT 41.0 09/07/2023   MCV 85.8 09/07/2023   PLT 341 09/07/2023     RADIOGRAPHIC STUDIES: I have personally reviewed the radiological images as listed and agreed with the findings in the report.  CT ABDOMEN PELVIS W CONTRAST CLINICAL DATA:  Ovarian cancer, monitor.  * Tracking Code: BO *.  EXAM: CT ABDOMEN AND PELVIS WITH CONTRAST  TECHNIQUE: Multidetector CT imaging of the abdomen and pelvis was performed using the standard protocol following bolus administration of intravenous contrast.  RADIATION DOSE REDUCTION: This exam was performed according to the departmental dose-optimization program which includes automated exposure control, adjustment of the mA and/or kV according to patient size and/or use of iterative reconstruction technique.  CONTRAST:  OMNIPAQUE  IOHEXOL  300 MG/ML  SOLN  COMPARISON:  Multiple priors including CT April 06, 2023  FINDINGS: Lower chest: No acute abnormality.  Small hiatal hernia.  Hepatobiliary: No suspicious hepatic lesion. Cholelithiasis. No biliary ductal dilation.  Pancreas: No pancreatic ductal dilation or evidence of acute inflammation.  Spleen: No splenomegaly.  Adrenals/Urinary Tract: No suspicious adrenal nodule/mass. No hydronephrosis. Kidneys demonstrate symmetric enhancement. Urinary bladder is nondistended.  Stomach/Bowel: Radiopaque enteric contrast material traverses the rectum. Small hiatal hernia. No pathologic dilation of small or large bowel. Non to pending is. Scattered colonic  diverticulosis.  Vascular/Lymphatic: Normal caliber abdominal aorta. Scattered aortic atherosclerosis. Smooth IVC contours.  Increased size of the left external iliac lymph node now measuring 16 mm in short axis on image 72/2 previously 11 mm.  Reproductive: Similar soft tissue nodularity along the right vaginal cuff measuring 2.1 cm on image 77/2 previously 2.2 cm.  Other: No significant abdominopelvic free fluid.  Musculoskeletal: No aggressive lytic or blastic lesion of bone. Right total hip arthroplasty. Multilevel degenerative changes spine.  IMPRESSION: 1. Increased size of the left external iliac lymph node now measuring 16 mm in short axis previously 11 mm, concerning for progressive nodal disease. 2. Similar soft tissue nodularity along the right vaginal cuff measuring 2.1 cm previously 2.2 cm. 3. Cholelithiasis. 4. Small hiatal hernia. 5. Aortic atherosclerosis.  Aortic Atherosclerosis (ICD10-I70.0).  Electronically Signed   By: Reyes Holder M.D.   On: 08/16/2023 11:59

## 2023-09-07 ENCOUNTER — Inpatient Hospital Stay (HOSPITAL_BASED_OUTPATIENT_CLINIC_OR_DEPARTMENT_OTHER): Admitting: Oncology

## 2023-09-07 ENCOUNTER — Other Ambulatory Visit: Payer: Self-pay

## 2023-09-07 ENCOUNTER — Inpatient Hospital Stay: Attending: Oncology

## 2023-09-07 ENCOUNTER — Other Ambulatory Visit: Payer: Self-pay | Admitting: *Deleted

## 2023-09-07 DIAGNOSIS — Z5111 Encounter for antineoplastic chemotherapy: Secondary | ICD-10-CM | POA: Insufficient documentation

## 2023-09-07 DIAGNOSIS — G893 Neoplasm related pain (acute) (chronic): Secondary | ICD-10-CM | POA: Diagnosis not present

## 2023-09-07 DIAGNOSIS — C541 Malignant neoplasm of endometrium: Secondary | ICD-10-CM

## 2023-09-07 DIAGNOSIS — Z8542 Personal history of malignant neoplasm of other parts of uterus: Secondary | ICD-10-CM | POA: Diagnosis not present

## 2023-09-07 DIAGNOSIS — R110 Nausea: Secondary | ICD-10-CM | POA: Diagnosis not present

## 2023-09-07 DIAGNOSIS — C786 Secondary malignant neoplasm of retroperitoneum and peritoneum: Secondary | ICD-10-CM | POA: Insufficient documentation

## 2023-09-07 DIAGNOSIS — G620 Drug-induced polyneuropathy: Secondary | ICD-10-CM

## 2023-09-07 DIAGNOSIS — F419 Anxiety disorder, unspecified: Secondary | ICD-10-CM

## 2023-09-07 DIAGNOSIS — T451X5A Adverse effect of antineoplastic and immunosuppressive drugs, initial encounter: Secondary | ICD-10-CM

## 2023-09-07 DIAGNOSIS — C775 Secondary and unspecified malignant neoplasm of intrapelvic lymph nodes: Secondary | ICD-10-CM | POA: Diagnosis present

## 2023-09-07 DIAGNOSIS — F321 Major depressive disorder, single episode, moderate: Secondary | ICD-10-CM

## 2023-09-07 DIAGNOSIS — Z95828 Presence of other vascular implants and grafts: Secondary | ICD-10-CM

## 2023-09-07 DIAGNOSIS — R197 Diarrhea, unspecified: Secondary | ICD-10-CM

## 2023-09-07 LAB — CBC WITH DIFFERENTIAL/PLATELET
Abs Immature Granulocytes: 0.02 K/uL (ref 0.00–0.07)
Basophils Absolute: 0.1 K/uL (ref 0.0–0.1)
Basophils Relative: 1 %
Eosinophils Absolute: 0.1 K/uL (ref 0.0–0.5)
Eosinophils Relative: 2 %
HCT: 41 % (ref 36.0–46.0)
Hemoglobin: 13.7 g/dL (ref 12.0–15.0)
Immature Granulocytes: 0 %
Lymphocytes Relative: 34 %
Lymphs Abs: 2.4 K/uL (ref 0.7–4.0)
MCH: 28.7 pg (ref 26.0–34.0)
MCHC: 33.4 g/dL (ref 30.0–36.0)
MCV: 85.8 fL (ref 80.0–100.0)
Monocytes Absolute: 0.5 K/uL (ref 0.1–1.0)
Monocytes Relative: 7 %
Neutro Abs: 3.9 K/uL (ref 1.7–7.7)
Neutrophils Relative %: 56 %
Platelets: 341 K/uL (ref 150–400)
RBC: 4.78 MIL/uL (ref 3.87–5.11)
RDW: 15.9 % — ABNORMAL HIGH (ref 11.5–15.5)
WBC: 7 K/uL (ref 4.0–10.5)
nRBC: 0 % (ref 0.0–0.2)

## 2023-09-07 LAB — MAGNESIUM: Magnesium: 1.8 mg/dL (ref 1.7–2.4)

## 2023-09-07 LAB — COMPREHENSIVE METABOLIC PANEL WITH GFR
ALT: 20 U/L (ref 0–44)
AST: 19 U/L (ref 15–41)
Albumin: 3.9 g/dL (ref 3.5–5.0)
Alkaline Phosphatase: 110 U/L (ref 38–126)
Anion gap: 14 (ref 5–15)
BUN: 14 mg/dL (ref 6–20)
CO2: 20 mmol/L — ABNORMAL LOW (ref 22–32)
Calcium: 9.2 mg/dL (ref 8.9–10.3)
Chloride: 106 mmol/L (ref 98–111)
Creatinine, Ser: 0.58 mg/dL (ref 0.44–1.00)
GFR, Estimated: >60 mL/min (ref >60)
Glucose, Bld: 112 mg/dL — ABNORMAL HIGH (ref 70–99)
Potassium: 3.9 mmol/L (ref 3.5–5.1)
Sodium: 140 mmol/L (ref 135–145)
Total Bilirubin: 0.5 mg/dL (ref 0.0–1.2)
Total Protein: 7.2 g/dL (ref 6.5–8.1)

## 2023-09-07 MED ORDER — OXYCODONE HCL 5 MG PO TABS: 5.0000 mg | ORAL_TABLET | Freq: Two times a day (BID) | ORAL | 0 refills | Status: AC | PRN

## 2023-09-07 NOTE — Progress Notes (Signed)
 Patients port flushed without difficulty.  Good blood return noted with no bruising or swelling noted at site.  Labs drawn per orders.  VSS with discharge and left in satisfactory condition with no s/s of distress noted. All follow ups as scheduled.       Jessica [name]

## 2023-09-07 NOTE — Patient Instructions (Addendum)
 [facility] at [facility] Discharge Instructions   You were seen and examined today by Dr. Davonna.  She reviewed the results of your lab work which are normal/stable.   We will proceed with your treatment tomorrow.    Return as scheduled.    Thank you for choosing [facility] at [facility] to provide your oncology and hematology care.  To afford each patient quality time with our provider, please arrive at least 15 minutes before your scheduled appointment time.   If you have a lab appointment with the Cancer Center please come in thru the Main Entrance and check in at the main information desk.  You need to re-schedule your appointment should you arrive 10 or more minutes late.  We strive to give you quality time with our providers, and arriving late affects you and other patients whose appointments are after yours.  Also, if you no show three or more times for appointments you may be dismissed from the clinic at the providers discretion.     Again, thank you for choosing [facility].  Our hope is that these requests will decrease the amount of time that you wait before being seen by our physicians.       _____________________________________________________________  Should you have questions after your visit to [facility], please contact our office at [phone] and follow the prompts.  Our office hours are 8:00 a.m. and 4:30 p.m. Monday - Friday.  Please note that voicemails left after 4:00 p.m. may not be returned until the following business day.  We are closed weekends and major holidays.  You do have access to a nurse 24-7, just call the main number to the clinic [phone] and do not press any options, hold on the line and a nurse will answer the phone.    For prescription refill requests, have your pharmacy contact our office and allow 72 hours.    Due to Covid, you will need to wear a mask upon entering  the hospital. If you do not have a mask, a mask will be given to you at the Main Entrance upon arrival. For doctor visits, patients may have 1 support person age 61 or older with them. For treatment visits, patients can not have anyone with them due to social distancing guidelines and our immunocompromised population.

## 2023-09-08 ENCOUNTER — Inpatient Hospital Stay

## 2023-09-08 VITALS — BP 117/68 | HR 80 | Temp 98.0°F | Resp 19

## 2023-09-08 DIAGNOSIS — C541 Malignant neoplasm of endometrium: Secondary | ICD-10-CM

## 2023-09-08 DIAGNOSIS — Z5111 Encounter for antineoplastic chemotherapy: Secondary | ICD-10-CM | POA: Diagnosis not present

## 2023-09-08 DIAGNOSIS — Z95828 Presence of other vascular implants and grafts: Secondary | ICD-10-CM

## 2023-09-08 MED ORDER — CETIRIZINE HCL 10 MG/ML IV SOLN
5.0000 mg | Freq: Once | INTRAVENOUS | Status: AC
  Administered 2023-09-08: 5 mg via INTRAVENOUS
  Filled 2023-09-08: qty 1

## 2023-09-08 MED ORDER — PALONOSETRON HCL INJECTION 0.25 MG/5ML
0.2500 mg | Freq: Once | INTRAVENOUS | Status: AC
  Administered 2023-09-08: 0.25 mg via INTRAVENOUS
  Filled 2023-09-08: qty 5

## 2023-09-08 MED ORDER — FAMOTIDINE IN NACL 20-0.9 MG/50ML-% IV SOLN
20.0000 mg | Freq: Once | INTRAVENOUS | Status: AC
  Administered 2023-09-08: 20 mg via INTRAVENOUS
  Filled 2023-09-08: qty 50

## 2023-09-08 MED ORDER — DEXAMETHASONE SODIUM PHOSPHATE 10 MG/ML IJ SOLN
10.0000 mg | Freq: Once | INTRAMUSCULAR | Status: AC
Start: 2023-09-08 — End: 2023-09-08
  Administered 2023-09-08: 10 mg via INTRAVENOUS
  Filled 2023-09-08: qty 1

## 2023-09-08 MED ORDER — SODIUM CHLORIDE 0.9 % IV SOLN
140.0000 mg/m2 | Freq: Once | INTRAVENOUS | Status: AC
  Administered 2023-09-08: 306 mg via INTRAVENOUS
  Filled 2023-09-08: qty 51

## 2023-09-08 MED ORDER — SODIUM CHLORIDE 0.9 % IV SOLN
Freq: Once | INTRAVENOUS | Status: AC
Start: 2023-09-08 — End: 2023-09-08

## 2023-09-08 MED ORDER — CETIRIZINE HCL 10 MG/ML IV SOLN: 10.0000 mg | Freq: Once | INTRAVENOUS | Status: DC

## 2023-09-08 NOTE — Progress Notes (Signed)
 Okay to lower cetirizine  dose to 5 mg per Dr. Davonna given low BP and drowsiness with previous cycle.  Harlene Nasuti, PharmD Oncology Infusion Pharmacist 09/08/2023 8:32 AM

## 2023-09-08 NOTE — Progress Notes (Signed)
 Patient presents today for chemotherapy infusion.  Patient is in satisfactory condition with no new complaints voiced.  Vital signs are stable.  Labs from 08/18/025 reviewed and all labs are within treatment parameters.  We will proceed with treatment per MD orders.    Patient tolerated treatment well with no complaints voiced.  Patient left ambulatory in stable condition.  Vital signs stable at discharge.  Follow up as scheduled.

## 2023-09-08 NOTE — Patient Instructions (Signed)
 [facility] [place] [facility]  Discharge Instructions: Thank you for choosing [facility] to provide your oncology and hematology care.  If you have a lab appointment with the Cancer Center - please note that after April 8th, 2024, all labs will be drawn in the cancer center.  You do not have to check in or register with the main entrance as you have in the past but will complete your check-in in the cancer center.  Wear comfortable clothing and clothing appropriate for easy access to any Portacath or PICC line.   We strive to give you quality time with your provider. You may need to reschedule your appointment if you arrive late (15 or more minutes).  Arriving late affects you and other patients whose appointments are after yours.  Also, if you miss three or more appointments without notifying the office, you may be dismissed from the clinic at the provider's discretion.      For prescription refill requests, have your pharmacy contact our office and allow 72 hours for refills to be completed.    Today you received the following chemotherapy and/or immunotherapy agents Taxol.  Paclitaxel Injection What is this medication? PACLITAXEL (PAK li TAX el) treats some types of cancer. It works by slowing down the growth of cancer cells. This medicine may be used for other purposes; ask your health care provider or pharmacist if you have questions. COMMON BRAND NAME(S): Onxol, Taxol What should I tell my care team before I take this medication? They need to know if you have any of these conditions: Heart disease Liver disease Low white blood cell levels An unusual or allergic reaction to paclitaxel, other medications, foods, dyes, or preservatives If you or your partner are pregnant or trying to get pregnant Breast-feeding How should I use this medication? This medication is injected into a vein. It is given by your care team in a hospital or  clinic setting. Talk to your care team about the use of this medication in children. While it may be given to children for selected conditions, precautions do apply. Overdosage: If you think you have taken too much of this medicine contact a poison control center or emergency room at once. NOTE: This medicine is only for you. Do not share this medicine with others. What if I miss a dose? Keep appointments for follow-up doses. It is important not to miss your dose. Call your care team if you are unable to keep an appointment. What may interact with this medication? Do not take this medication with any of the following: Live virus vaccines Other medications may affect the way this medication works. Talk with your care team about all of the medications you take. They may suggest changes to your treatment plan to lower the risk of side effects and to make sure your medications work as intended. This list may not describe all possible interactions. Give your health care provider a list of all the medicines, herbs, non-prescription drugs, or dietary supplements you use. Also tell them if you smoke, drink alcohol, or use illegal drugs. Some items may interact with your medicine. What should I watch for while using this medication? Your condition will be monitored carefully while you are receiving this medication. You may need blood work while taking this medication. This medication may make you feel generally unwell. This is not uncommon as chemotherapy can affect healthy cells as well as cancer cells. Report any  side effects. Continue your course of treatment even though you feel ill unless your care team tells you to stop. This medication can cause serious allergic reactions. To reduce the risk, your care team may give you other medications to take before receiving this one. Be sure to follow the directions from your care team. This medication may increase your risk of getting an infection. Call your care  team for advice if you get a fever, chills, sore throat, or other symptoms of a cold or flu. Do not treat yourself. Try to avoid being around people who are sick. This medication may increase your risk to bruise or bleed. Call your care team if you notice any unusual bleeding. Be careful brushing or flossing your teeth or using a toothpick because you may get an infection or bleed more easily. If you have any dental work done, tell your dentist you are receiving this medication. Talk to your care team if you may be pregnant. Serious birth defects can occur if you take this medication during pregnancy. Talk to your care team before breastfeeding. Changes to your treatment plan may be needed. What side effects may I notice from receiving this medication? Side effects that you should report to your care team as soon as possible: Allergic reactions--skin rash, itching, hives, swelling of the face, lips, tongue, or throat Heart rhythm changes--fast or irregular heartbeat, dizziness, feeling faint or lightheaded, chest pain, trouble breathing Increase in blood pressure Infection--fever, chills, cough, sore throat, wounds that don't heal, pain or trouble when passing urine, general feeling of discomfort or being unwell Low blood pressure--dizziness, feeling faint or lightheaded, blurry vision Low red blood cell level--unusual weakness or fatigue, dizziness, headache, trouble breathing Painful swelling, warmth, or redness of the skin, blisters or sores at the infusion site Pain, tingling, or numbness in the hands or feet Slow heartbeat--dizziness, feeling faint or lightheaded, confusion, trouble breathing, unusual weakness or fatigue Unusual bruising or bleeding Side effects that usually do not require medical attention (report to your care team if they continue or are bothersome): Diarrhea Hair loss Joint pain Loss of appetite Muscle pain Nausea Vomiting This list may not describe all possible side  effects. Call your doctor for medical advice about side effects. You may report side effects to FDA at 1-800-FDA-1088. Where should I keep my medication? This medication is given in a hospital or clinic. It will not be stored at home. NOTE: This sheet is a summary. It may not cover all possible information. If you have questions about this medicine, talk to your doctor, pharmacist, or health care provider.  2024 Elsevier/Gold Standard (2021-05-28 00:00:00)       To help prevent nausea and vomiting after your treatment, we encourage you to take your nausea medication as directed.  BELOW ARE SYMPTOMS THAT SHOULD BE REPORTED IMMEDIATELY: *FEVER GREATER THAN 100.4 F (38 C) OR HIGHER *CHILLS OR SWEATING *NAUSEA AND VOMITING THAT IS NOT CONTROLLED WITH YOUR NAUSEA MEDICATION *UNUSUAL SHORTNESS OF BREATH *UNUSUAL BRUISING OR BLEEDING *URINARY PROBLEMS (pain or burning when urinating, or frequent urination) *BOWEL PROBLEMS (unusual diarrhea, constipation, pain near the anus) TENDERNESS IN MOUTH AND THROAT WITH OR WITHOUT PRESENCE OF ULCERS (sore throat, sores in mouth, or a toothache) UNUSUAL RASH, SWELLING OR PAIN  UNUSUAL VAGINAL DISCHARGE OR ITCHING   Items with * indicate a potential emergency and should be followed up as soon as possible or go to the Emergency Department if any problems should occur.  Please show the CHEMOTHERAPY ALERT  CARD or IMMUNOTHERAPY ALERT CARD at check-in to the Emergency Department and triage nurse.  Should you have questions after your visit or need to cancel or reschedule your appointment, please contact [facility] [place] - A DEPT OF Eligha Bridegroom [facility] [phone]  and follow the prompts.  Office hours are 8:00 a.m. to 4:30 p.m. Monday - Friday. Please note that voicemails left after 4:00 p.m. may not be returned until the following business day.  We are closed weekends and major holidays. You have access to a nurse at all times for urgent  questions. Please call the main number to the clinic [phone] and follow the prompts.  For any non-urgent questions, you may also contact your provider using MyChart. We now offer e-Visits for anyone 19 and older to request care online for non-urgent symptoms. For details visit [email or web address].   Also download the MyChart app! Go to the app store, search "MyChart", open the app, select [place], and log in with your MyChart username and password.

## 2023-09-25 ENCOUNTER — Other Ambulatory Visit: Payer: Self-pay | Admitting: Internal Medicine

## 2023-09-25 DIAGNOSIS — E785 Hyperlipidemia, unspecified: Secondary | ICD-10-CM

## 2023-09-25 DIAGNOSIS — I10 Essential (primary) hypertension: Secondary | ICD-10-CM

## 2023-09-26 ENCOUNTER — Encounter: Payer: Self-pay | Admitting: Oncology

## 2023-09-26 DIAGNOSIS — R197 Diarrhea, unspecified: Secondary | ICD-10-CM | POA: Insufficient documentation

## 2023-09-26 DIAGNOSIS — R110 Nausea: Secondary | ICD-10-CM | POA: Insufficient documentation

## 2023-09-26 DIAGNOSIS — R109 Unspecified abdominal pain: Secondary | ICD-10-CM | POA: Insufficient documentation

## 2023-09-26 DIAGNOSIS — G893 Neoplasm related pain (acute) (chronic): Secondary | ICD-10-CM | POA: Insufficient documentation

## 2023-09-26 NOTE — Assessment & Plan Note (Addendum)
 Leg pain associated with chemotherapy, not related to growth factor injections.  Prefers 5 mg pain medication due to sedation with 10 mg.  - Prescribe 5 mg pain medication for leg pain, twice daily as needed

## 2023-09-26 NOTE — Assessment & Plan Note (Addendum)
 Diarrhea occurs primarily during chemotherapy week, manageable.  Prefers diarrhea over constipation.  - Continue Imodium as needed for diarrhea management.

## 2023-09-26 NOTE — Assessment & Plan Note (Addendum)
 Neuropathy with tingling on and off in the feet and toes has been stable.  -Continue Lyrica  twice daily.   -Will closely monitor worsening of neuropathy as we started back on paclitaxel .

## 2023-09-26 NOTE — Assessment & Plan Note (Addendum)
 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  Continue with treatment if labs are within parameters - Labs reviewed today: CMP: Normal creatinine, normal LFTs, CBC: WNL - Will obtain a CT scan at 3 months from last scan that is due in October 2025 - Can consider chemotherapy holiday if there is a significant response to treatment.  Return to clinic in 6 weeks for follow-up

## 2023-09-26 NOTE — Assessment & Plan Note (Addendum)
 Significant nausea managed effectively with Compazine .  No constipation, occasional diarrhea noted.  - Continue Compazine  for nausea management.

## 2023-09-29 ENCOUNTER — Inpatient Hospital Stay: Attending: Hematology

## 2023-09-29 ENCOUNTER — Inpatient Hospital Stay

## 2023-09-29 VITALS — BP 133/79 | HR 84 | Temp 97.7°F | Resp 18

## 2023-09-29 DIAGNOSIS — G893 Neoplasm related pain (acute) (chronic): Secondary | ICD-10-CM | POA: Insufficient documentation

## 2023-09-29 DIAGNOSIS — Z8542 Personal history of malignant neoplasm of other parts of uterus: Secondary | ICD-10-CM | POA: Diagnosis not present

## 2023-09-29 DIAGNOSIS — C541 Malignant neoplasm of endometrium: Secondary | ICD-10-CM

## 2023-09-29 DIAGNOSIS — Z79899 Other long term (current) drug therapy: Secondary | ICD-10-CM | POA: Diagnosis not present

## 2023-09-29 DIAGNOSIS — C786 Secondary malignant neoplasm of retroperitoneum and peritoneum: Secondary | ICD-10-CM | POA: Diagnosis present

## 2023-09-29 DIAGNOSIS — G620 Drug-induced polyneuropathy: Secondary | ICD-10-CM | POA: Insufficient documentation

## 2023-09-29 DIAGNOSIS — Z95828 Presence of other vascular implants and grafts: Secondary | ICD-10-CM

## 2023-09-29 DIAGNOSIS — C775 Secondary and unspecified malignant neoplasm of intrapelvic lymph nodes: Secondary | ICD-10-CM | POA: Insufficient documentation

## 2023-09-29 DIAGNOSIS — T451X5A Adverse effect of antineoplastic and immunosuppressive drugs, initial encounter: Secondary | ICD-10-CM | POA: Insufficient documentation

## 2023-09-29 DIAGNOSIS — Z5111 Encounter for antineoplastic chemotherapy: Secondary | ICD-10-CM | POA: Insufficient documentation

## 2023-09-29 LAB — CBC WITH DIFFERENTIAL/PLATELET
Abs Immature Granulocytes: 0.04 K/uL (ref 0.00–0.07)
Basophils Absolute: 0.1 K/uL (ref 0.0–0.1)
Basophils Relative: 1 %
Eosinophils Absolute: 0.3 K/uL (ref 0.0–0.5)
Eosinophils Relative: 4 %
HCT: 39.4 % (ref 36.0–46.0)
Hemoglobin: 13 g/dL (ref 12.0–15.0)
Immature Granulocytes: 1 %
Lymphocytes Relative: 33 %
Lymphs Abs: 2.4 K/uL (ref 0.7–4.0)
MCH: 28.3 pg (ref 26.0–34.0)
MCHC: 33 g/dL (ref 30.0–36.0)
MCV: 85.8 fL (ref 80.0–100.0)
Monocytes Absolute: 0.6 K/uL (ref 0.1–1.0)
Monocytes Relative: 8 %
Neutro Abs: 3.9 K/uL (ref 1.7–7.7)
Neutrophils Relative %: 53 %
Platelets: 320 K/uL (ref 150–400)
RBC: 4.59 MIL/uL (ref 3.87–5.11)
RDW: 16.2 % — ABNORMAL HIGH (ref 11.5–15.5)
WBC: 7.3 K/uL (ref 4.0–10.5)
nRBC: 0 % (ref 0.0–0.2)

## 2023-09-29 LAB — COMPREHENSIVE METABOLIC PANEL WITH GFR
ALT: 17 U/L (ref 0–44)
AST: 20 U/L (ref 15–41)
Albumin: 3.7 g/dL (ref 3.5–5.0)
Alkaline Phosphatase: 103 U/L (ref 38–126)
Anion gap: 11 (ref 5–15)
BUN: 15 mg/dL (ref 6–20)
CO2: 25 mmol/L (ref 22–32)
Calcium: 9.4 mg/dL (ref 8.9–10.3)
Chloride: 103 mmol/L (ref 98–111)
Creatinine, Ser: 0.72 mg/dL (ref 0.44–1.00)
GFR, Estimated: >60 mL/min (ref >60)
Glucose, Bld: 136 mg/dL — ABNORMAL HIGH (ref 70–99)
Potassium: 3.7 mmol/L (ref 3.5–5.1)
Sodium: 139 mmol/L (ref 135–145)
Total Bilirubin: 0.5 mg/dL (ref 0.0–1.2)
Total Protein: 6.9 g/dL (ref 6.5–8.1)

## 2023-09-29 LAB — MAGNESIUM: Magnesium: 1.7 mg/dL (ref 1.7–2.4)

## 2023-09-29 MED ORDER — SODIUM CHLORIDE 0.9 % IV SOLN: Freq: Once | INTRAVENOUS | Status: AC

## 2023-09-29 MED ORDER — DEXAMETHASONE SODIUM PHOSPHATE 10 MG/ML IJ SOLN
10.0000 mg | Freq: Once | INTRAMUSCULAR | Status: AC
  Administered 2023-09-29: 10 mg via INTRAVENOUS
  Filled 2023-09-29: qty 1

## 2023-09-29 MED ORDER — CETIRIZINE HCL 10 MG/ML IV SOLN
5.0000 mg | Freq: Once | INTRAVENOUS | Status: AC
  Administered 2023-09-29: 5 mg via INTRAVENOUS
  Filled 2023-09-29: qty 1

## 2023-09-29 MED ORDER — FAMOTIDINE IN NACL 20-0.9 MG/50ML-% IV SOLN
20.0000 mg | Freq: Once | INTRAVENOUS | Status: AC
  Administered 2023-09-29: 20 mg via INTRAVENOUS
  Filled 2023-09-29: qty 50

## 2023-09-29 MED ORDER — SODIUM CHLORIDE 0.9 % IV SOLN
140.0000 mg/m2 | Freq: Once | INTRAVENOUS | Status: AC
  Administered 2023-09-29: 306 mg via INTRAVENOUS
  Filled 2023-09-29: qty 51

## 2023-09-29 MED ORDER — PALONOSETRON HCL INJECTION 0.25 MG/5ML
0.2500 mg | Freq: Once | INTRAVENOUS | Status: AC
  Administered 2023-09-29: 0.25 mg via INTRAVENOUS
  Filled 2023-09-29: qty 5

## 2023-09-29 NOTE — Patient Instructions (Signed)
 [facility] [place] - [facility]  Discharge Instructions: Thank you for choosing [facility] to provide your oncology and hematology care.  If you have a lab appointment with the Cancer Center - please note that after April 8th, 2024, all labs will be drawn in the cancer center.  You do not have to check in or register with the main entrance as you have in the past but will complete your check-in in the cancer center.  Wear comfortable clothing and clothing appropriate for easy access to any Portacath or PICC line.   We strive to give you quality time with your provider. You may need to reschedule your appointment if you arrive late (15 or more minutes).  Arriving late affects you and other patients whose appointments are after yours.  Also, if you miss three or more appointments without notifying the office, you may be dismissed from the clinic at the provider's discretion.      For prescription refill requests, have your pharmacy contact our office and allow 72 hours for refills to be completed.    Today you received the following chemotherapy and/or immunotherapy agents Taxol,return as scheduled.   To help prevent nausea and vomiting after your treatment, we encourage you to take your nausea medication as directed.  BELOW ARE SYMPTOMS THAT SHOULD BE REPORTED IMMEDIATELY: *FEVER GREATER THAN 100.4 F (38 C) OR HIGHER *CHILLS OR SWEATING *NAUSEA AND VOMITING THAT IS NOT CONTROLLED WITH YOUR NAUSEA MEDICATION *UNUSUAL SHORTNESS OF BREATH *UNUSUAL BRUISING OR BLEEDING *URINARY PROBLEMS (pain or burning when urinating, or frequent urination) *BOWEL PROBLEMS (unusual diarrhea, constipation, pain near the anus) TENDERNESS IN MOUTH AND THROAT WITH OR WITHOUT PRESENCE OF ULCERS (sore throat, sores in mouth, or a toothache) UNUSUAL RASH, SWELLING OR PAIN  UNUSUAL VAGINAL DISCHARGE OR ITCHING   Items with * indicate a potential emergency and should  be followed up as soon as possible or go to the Emergency Department if any problems should occur.  Please show the CHEMOTHERAPY ALERT CARD or IMMUNOTHERAPY ALERT CARD at check-in to the Emergency Department and triage nurse.  Should you have questions after your visit or need to cancel or reschedule your appointment, please contact [facility] [place] - A DEPT OF [facility] Bridegroom [facility] [phone]  and follow the prompts.  Office hours are 8:00 a.m. to 4:30 p.m. Monday - Friday. Please note that voicemails left after 4:00 p.m. may not be returned until the following business day.  We are closed weekends and major holidays. You have access to a nurse at all times for urgent questions. Please call the main number to the clinic [phone] and follow the prompts.  For any non-urgent questions, you may also contact your provider using MyChart. We now offer e-Visits for anyone 40 and older to request care online for non-urgent symptoms. For details visit [email or web address].   Also download the MyChart app! Go to the app store, search "MyChart", open the app, select [place], and log in with your MyChart username and password.

## 2023-09-29 NOTE — Progress Notes (Signed)
Patient tolerated chemotherapy with no complaints voiced. Side effects with management reviewed understanding verbalized. Port site clean and dry with no bruising or swelling noted at site. Good blood return noted before and after administration of chemotherapy. Band aid applied. Patient left in satisfactory condition with VSS and no s/s of distress noted. 

## 2023-09-30 ENCOUNTER — Ambulatory Visit: Admitting: Orthopaedic Surgery

## 2023-10-03 ENCOUNTER — Other Ambulatory Visit: Payer: Self-pay

## 2023-10-03 DIAGNOSIS — F321 Major depressive disorder, single episode, moderate: Secondary | ICD-10-CM

## 2023-10-03 DIAGNOSIS — F419 Anxiety disorder, unspecified: Secondary | ICD-10-CM

## 2023-10-13 ENCOUNTER — Other Ambulatory Visit: Payer: Self-pay | Admitting: Oncology

## 2023-10-13 DIAGNOSIS — Z95828 Presence of other vascular implants and grafts: Secondary | ICD-10-CM

## 2023-10-13 DIAGNOSIS — C541 Malignant neoplasm of endometrium: Secondary | ICD-10-CM

## 2023-10-16 ENCOUNTER — Other Ambulatory Visit: Payer: Self-pay

## 2023-10-16 MED ORDER — PREGABALIN 75 MG PO CAPS: 75.0000 mg | ORAL_CAPSULE | Freq: Three times a day (TID) | ORAL | 3 refills | Status: AC

## 2023-10-19 ENCOUNTER — Encounter: Payer: Self-pay | Admitting: Internal Medicine

## 2023-10-19 ENCOUNTER — Ambulatory Visit: Admitting: Internal Medicine

## 2023-10-19 VITALS — BP 112/73 | HR 64 | Ht 64.0 in | Wt 228.6 lb

## 2023-10-19 DIAGNOSIS — B351 Tinea unguium: Secondary | ICD-10-CM | POA: Insufficient documentation

## 2023-10-19 DIAGNOSIS — I10 Essential (primary) hypertension: Secondary | ICD-10-CM

## 2023-10-19 DIAGNOSIS — L821 Other seborrheic keratosis: Secondary | ICD-10-CM | POA: Insufficient documentation

## 2023-10-19 NOTE — Assessment & Plan Note (Signed)
 Her back lesions are likely seborrheic keratosis Referred to local dermatology as per patient request

## 2023-10-19 NOTE — Progress Notes (Signed)
 Acute Office Visit  Subjective:    Patient ID: Jessica [name], female    DOB: 01-Mar-1960, 63 y.o.   MRN: [id number]  Chief Complaint  Patient presents with   Skin Problem    Would like a referral to dermatology [facility] Dermatology is what she prefers.    Nail Problem    Has concern on left foot nail problem.     HPI Patient is in today for evaluation of brownish spots on her back.  She reports having 2 spots removed in the last year by Dr. Shona, but has noticed recurrence of similar spots.  She could not follow-up with them due to insurance coverage concern.  She requests a new referral to [facility] dermatology.  She also reports brownish discoloration of left second toenail.  She reports a remote metal bar injury to the left foot, but does not recall any specific hematoma around the toenail.  Denies any local discharge or bleeding currently.  Past Medical History:  Diagnosis Date   Allergy    Anemia    Anxiety    Arthritis    Cancer (HCC)    Phreesia 09/05/2019   Cancer of skin of back    Chest pain    Depression    Elevated blood pressure reading    Endometrial cancer (HCC)    Hyperlipidemia    Hypertension    PMB (postmenopausal bleeding)    Port-A-Cath in place 12/24/2018   Pre-diabetes     Past Surgical History:  Procedure Laterality Date   ABDOMINAL HYSTERECTOMY N/A    Phreesia 09/05/2019   IR IMAGING GUIDED PORT INSERTION  12/28/2018   IR RADIOLOGIST EVAL & MGMT  06/02/2018   IR RADIOLOGIST EVAL & MGMT  06/16/2018   NECK SURGERY  2000   spinal surgery , titanium rod in place    ROBOTIC ASSISTED TOTAL HYSTERECTOMY WITH BILATERAL SALPINGO OOPHERECTOMY N/A 04/13/2018   Procedure: XI ROBOTIC ASSISTED TOTAL HYSTERECTOMY WITH BILATERAL SALPINGO OOPHORECTOMY;  Surgeon: Eloy Herring, MD;  Location: [facility];  Service: Gynecology;  Laterality: N/A;   ROBOTIC PELVIC AND PARA-AORTIC LYMPH NODE DISSECTION N/A 04/13/2018   Procedure: XI ROBOTIC PELVIC LYMPHADECTOMY AND  PARA-AORTIC LYMPH NODE DISSECTION;  Surgeon: Eloy Herring, MD;  Location: [facility];  Service: Gynecology;  Laterality: N/A;   TOTAL HIP ARTHROPLASTY Right 09/19/2022   Procedure: RIGHT TOTAL HIP ARTHROPLASTY ANTERIOR APPROACH;  Surgeon: Vernetta Lonni GRADE, MD;  Location: [facility];  Service: Orthopedics;  Laterality: Right;    Family History  Problem Relation Age of Onset   Hypertension Mother    Breast cancer Mother    Hypertension Father    Heart disease Father    Cancer Father        lung   Cancer Sister        ovarian cancer   Melanoma Sister     Social History   Socioeconomic History   Marital status: Widowed    Spouse name: Not on file   Number of children: 3   Years of education: Not on file   Highest education level: 12th grade  Occupational History   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  Tobacco Use   Smoking status: Never   Smokeless tobacco: Never  Vaping Use   Vaping status: Never Used  Substance and Sexual Activity   Alcohol use: Yes    Alcohol/week: 4.0 standard drinks of alcohol    Types: 4 Glasses of wine per week    Comment:  weekends   Drug use: Never   Sexual activity: Not Currently  Other Topics Concern   Not on file  Social History Narrative      Wears seat belt    Does not use phone while driving    Smoke detectors at home   Fire extinguisher-no   No weapons   Social Drivers of Health   Financial Resource Strain: Low Risk  (07/03/2023)   Overall Financial Resource Strain (CARDIA)    Difficulty of Paying Living Expenses: Not very hard  Food Insecurity: No Food Insecurity (07/03/2023)   Hunger Vital Sign    Worried About Running Out of Food in the Last Year: Never true    Ran Out of Food in the Last Year: Never true  Transportation Needs: No Transportation Needs (07/03/2023)   PRAPARE - Administrator, Civil Service (Medical): No    Lack of Transportation (Non-Medical): No  Physical Activity: Sufficiently Active  (07/03/2023)   Exercise Vital Sign    Days of Exercise per Week: 3 days    Minutes of Exercise per Session: 50 min  Stress: No Stress Concern Present (07/03/2023)   [employer] of Occupational Health - Occupational Stress Questionnaire    Feeling of Stress: Only a little  Recent Concern: Stress - Stress Concern Present (04/15/2023)   [employer] of Occupational Health - Occupational Stress Questionnaire    Feeling of Stress : To some extent  Social Connections: Socially Isolated (07/03/2023)   Social Connection and Isolation Panel    Frequency of Communication with Friends and Family: Three times a week    Frequency of Social Gatherings with Friends and Family: Never    Attends Religious Services: Never    Database administrator or Organizations: No    Attends Engineer, structural: Not on file    Marital Status: Widowed  Intimate Partner Violence: Not At Risk (04/15/2023)   Humiliation, Afraid, Rape, and Kick questionnaire    Fear of Current or Ex-Partner: No    Emotionally Abused: No    Physically Abused: No    Sexually Abused: No    Outpatient Medications Prior to Visit  Medication Sig Dispense Refill   amLODipine  (NORVASC ) 5 MG tablet Take 1 tablet by mouth once daily 90 tablet 0   atorvastatin  (LIPITOR) 20 MG tablet Take 1 tablet by mouth once daily 90 tablet 0   Cholecalciferol  (VITAMIN D3) 25 MCG (1000 UT) CAPS Take 1 capsule (1,000 Units total) by mouth daily. 60 capsule 2   lidocaine -prilocaine  (EMLA ) cream Apply to port site prior to use 30 g 0   MAGnesium -Oxide 400 (240 Mg) MG tablet Take 1 tablet (400 mg total) by mouth 2 (two) times daily. 60 tablet 3   meclizine  (ANTIVERT ) 25 MG tablet Take 1 tablet (25 mg total) by mouth 3 (three) times daily as needed for dizziness. 30 tablet 3   methocarbamol  (ROBAXIN ) 500 MG tablet Take 1 tablet (500 mg total) by mouth every 6 (six) hours as needed for muscle spasms. 40 tablet 1   nitroGLYCERIN  (NITROSTAT ) 0.4 MG SL  tablet Place 1 tablet (0.4 mg total) under the tongue every 5 (five) minutes as needed for chest pain. 25 tablet 3   oxyCODONE  (OXY IR/ROXICODONE ) 5 MG immediate release tablet Take 1 tablet (5 mg total) by mouth 2 (two) times daily as needed for severe pain (pain score 7-10). 60 tablet 0   PACLITAXEL  IV Inject into the vein every 21 ( twenty-one) days.  pregabalin  (LYRICA ) 75 MG capsule Take 1 capsule (75 mg total) by mouth 3 (three) times daily. 90 capsule 3   prochlorperazine  (COMPAZINE ) 10 MG tablet Take 1 tablet (10 mg total) by mouth every 6 (six) hours as needed (Nausea or vomiting). 60 tablet 3   RESTASIS 0.05 % ophthalmic emulsion SMARTSIG:1 Drop(s) In Eye(s) Every 12 Hours     sertraline  (ZOLOFT ) 100 MG tablet Take 1 tablet by mouth once daily 30 tablet 0   Facility-Administered Medications Prior to Visit  Medication Dose Route Frequency Provider Last Rate Last Admin   diphenhydrAMINE  (BENADRYL ) 50 MG/ML injection            palonosetron  (ALOXI ) 0.25 MG/5ML injection             Allergies  Allergen Reactions   Carboplatin  Other (See Comments)    body on fire and abdominal pain   Nickel Rash    itchy    Review of Systems  Constitutional:  Negative for chills and fever.  HENT:  Negative for congestion and sore throat.   Eyes:  Negative for pain and discharge.  Respiratory:  Negative for cough and shortness of breath.   Cardiovascular:  Negative for chest pain and palpitations.  Gastrointestinal:  Negative for abdominal pain, diarrhea, nausea and vomiting.  Endocrine: Negative for polydipsia and polyuria.  Genitourinary:  Negative for dysuria and hematuria.  Musculoskeletal:  Negative for neck pain and neck stiffness.  Skin:  Negative for rash.  Neurological:  Negative for dizziness and weakness.  Psychiatric/Behavioral:  Negative for agitation and behavioral problems.        Objective:    Physical Exam Vitals reviewed.  Constitutional:      General: She is not  in acute distress.    Appearance: She is obese. She is not diaphoretic.  HENT:     Head: Normocephalic and atraumatic.     Nose: Nose normal.     Mouth/Throat:     Mouth: Mucous membranes are moist.  Eyes:     General: No scleral icterus.    Extraocular Movements: Extraocular movements intact.  Cardiovascular:     Rate and Rhythm: Normal rate and regular rhythm.     Heart sounds: Normal heart sounds. No murmur heard. Pulmonary:     Breath sounds: Normal breath sounds. No wheezing or rales.  Musculoskeletal:     Cervical back: Neck supple. No tenderness.     Right lower leg: No edema.     Left lower leg: No edema.  Feet:     Comments: Brownish discoloration of and thick left second toenail Skin:    General: Skin is warm.     Findings: Lesion (Multiple brownish plaques over back) present.  Neurological:     General: No focal deficit present.     Mental Status: She is alert and oriented to person, place, and time.  Psychiatric:        Mood and Affect: Mood normal.        Behavior: Behavior normal.     BP 112/73   Pulse 64   Ht 5' 4 (1.626 m)   Wt 228 lb 9.6 oz (103.7 kg)   SpO2 93%   BMI 39.24 kg/m  Wt Readings from Last 3 Encounters:  10/19/23 228 lb 9.6 oz (103.7 kg)  09/29/23 233 lb 9.6 oz (106 kg)  09/07/23 229 lb 15 oz (104.3 kg)        Assessment & Plan:   Problem List Items Addressed This Visit  Cardiovascular and Mediastinum   Essential hypertension (Chronic)   BP Readings from Last 1 Encounters:  10/19/23 112/73   Well-controlled with amlodipine  5 mg QD Continue to follow low-salt diet and ambulate as tolerated        Musculoskeletal and Integument   SK (seborrheic keratosis) - Primary   Her back lesions are likely seborrheic keratosis Referred to local dermatology as per patient request      Relevant Orders   Ambulatory referral to Dermatology   Onychomycosis   Advised to use Lamisil cream for now to avoid systemic toxicity while  she is on chemotherapy for endometrial cancer If persistent, can consider oral terbinafine Advised to discuss with dermatology as well      Relevant Orders   Ambulatory referral to Dermatology     No orders of the defined types were placed in this encounter.    Suzzane MARLA Blanch, MD

## 2023-10-19 NOTE — Patient Instructions (Signed)
 Please use Lamisil cream over the left foot second toenail.  You are being referred to Dermatology.

## 2023-10-19 NOTE — Progress Notes (Signed)
 Patient Care Team: [name], FNP as PCP - General (Family Medicine) Mallipeddi, [name], MD as PCP - Cardiology (Cardiology) Wilson, [name], RN as Oncology Nurse Navigator  Clinic Day:  09/07/2023  Referring physician: [name], FNP   CHIEF COMPLAINT:  CC: Recurrent endometrial carcinosarcoma    ASSESSMENT & PLAN:   Assessment & Plan: Jessica [name]  is a 63 y.o. 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  - Labs reviewed today: CMP: Normal creatinine, normal LFTs, CBC: WNL -Physical exam stable today.  Can proceed with chemotherapy - Will obtain a CT scan at 3 months from last scan which is due in 3 weeks. - Can consider chemotherapy holiday if there is a significant response to treatment.  Return to clinic in 3 weeks for follow up with CT scan Neoplasm related pain Leg pain associated with chemotherapy, not related to growth factor injections.  Prefers 5 mg oxycodone  due to sedation with 10 mg.  - Continue 5 mg oxycodone  for leg pain, twice daily as needed Peripheral neuropathy due to chemotherapy Neuropathy with tingling on and off in the feet and toes has been stable.  -Continue Lyrica  75 mg twice daily.   -Will closely monitor worsening of neuropathy as we started back on paclitaxel . Nausea Significant nausea managed effectively with Compazine .  No constipation, occasional diarrhea noted.  - Continue Compazine  as needed for nausea management.  Diarrhea, unspecified type Diarrhea slightly worsened than before.  Reports more episodes but is manageable with Imodium.  - Continue Imodium as needed for diarrhea management.    The patient understands the plans discussed today and is in agreement with them.  She knows to contact our office if she develops concerns prior to her next appointment.  30 minutes of total time was spent for this patient encounter, including preparation, face-to-face counseling with the patient and coordination of care, physical exam, and documentation of the encounter. > 50% of the time was spent on counseling as documented under my assessment and plan.     Jessica [name],acting as a Neurosurgeon for Mickiel Dry, MD.,have documented all relevant documentation on the behalf of Mickiel Dry, MD,as directed by  Mickiel Dry, MD while in the presence of Mickiel Dry, MD.  I, Mickiel Dry MD, have reviewed the above documentation for accuracy and completeness, and I agree with the above.     Mickiel Dry, MD  [facility] [place] - A DEPT OF [name] [address] [place] [address] Dept: [phone] Dept Fax: [phone]   Orders Placed This Encounter  Procedures   CT CHEST ABDOMEN PELVIS W CONTRAST    Standing Status:   Future    Expected Date:   11/03/2023    Expiration Date:   10/19/2024    If indicated for the ordered procedure, I authorize the administration of contrast media per Radiology protocol:   Yes    Does the patient have a contrast media/X-ray dye allergy?:   No    Preferred imaging location?:   [facility]    If indicated for the ordered procedure, I authorize the administration of oral contrast media per Radiology protocol:   Yes  Is patient pregnant?:   No     ONCOLOGY HISTORY:   I have reviewed her chart and materials related to her cancer extensively and collaborated history with the patient. Summary of oncologic history is as follows:   Diagnosis: Recurrent endometrial carcinosarcoma   -03/29/2018: Cervical biopsy at 5 o'clock, 7 o'clock, and 12 o'clock.  -Pathology:  Carcinosarcoma (malignant mixed mullerian tumor). Much of the carcinomatous component has clear cell features. -04/13/2018: TAH, BSO, bilateral pelvic and para-aortic lymph node resections.  Pathology: High-grade carcinosarcoma (malignant mixed mullerian tumor) arising in an endometrial polyp with no myometrial or serosal invasion identified. 0/39 lymph nodes positive. pT1a pN0, FIGO stage Ia. MMR preserved. MSI-stable.  -12/09/2018 CT AP: Fairly extensive aggressive and new abdominal/pelvic carcinomatosis consistent with recurrent endometrial cancer. Associated small volume abdominal/pelvic ascites. No findings for bowel involvement or bowel obstruction. -12/22/2018 CA 125 elevated at 127.0 -12/28/2018 Omental mass biopsy. Pathology: Poorly differentiated carcinoma.  -Comment: Immunohistochemistry is positive for PAX 8 and ER (weak). CK7, CK20,  TTF-1, CDX-2 and GATA-3 are negative. -12/29/2018-10/05/2019: Carboplatin  and paclitaxel . Patient developed severe allergic reaction during cycle 14 with carboplatin  and was discontinued -05/02/2019: CT CAP:  Interval response to therapy as evidenced by decrease in size of peritoneal metastases. -07/04/2019: CT AP: Continued positive response to therapy. Scattered peritoneal metastases in the pelvis and left omentum are decreased. No new or progressive metastatic disease in the abdomen or pelvis. -10/24/2019: CT AP: Substantial reduction in size of soft tissue nodules in the pelvis compatible with positive response to therapy. Faint stranding in the left omentum at site of prior nodularity. -11/09/2019-08/22/2021: Single agent paclitaxel  started,01/18/2020 paclitaxel  dose reduced by 20%,  treatment held on 08/22/2021 for chemo holiday -01/10/2020-09/30/2022: CT AP: Stable disease - 04/06/2023:CT AP: New defined 2.2 cm soft tissue density along the superior and right lateral margin of the vaginal cuff, suspicious for local recurrence. Increased size of 11 mm lymph node  or peritoneal nodule adjacent to the left external iliac vessels, consistent with metastatic disease. Stable 3 mm nodule or lymph node in sigmoid mesocolon. -04/20/2023: [id number]: MPL [id number]. No other targetable mutations.  -05/18/2023: Left external iliac lymph node biopsy.  -Pathology: Poorly differentiated malignant neoplasm with dual cell population and areas of squamous differentiation.  -Comment: The epithelioid component shows squamous differentiation and shows labeling for cytokeratin AE1/AE3, p63 and CK5/6.  The more spindled component shows focal labeling for p63 but is negative for cytokeratin AE1/AE3 and CK5/6.  Immunostain for PAX8 shows only weak nonspecific labeling in both components.  -05/18/2023: [id number]: Not enough tissue. HER2 by IHC (1+).  -08/11/2023: CT AP: Increased size of the left external iliac lymph node now measuring 16 mm in short axis previously 11 mm, concerning for progressive nodal disease. Similar soft tissue nodularity along the right vaginal cuff measuring 2.1 cm previously 2.2 cm. -08/18/2023- Current: Paclitaxel  140 mg/m every 3 weeks (restarted)   Current Treatment:  Paclitaxel  140 mg/m every 3 weeks  INTERVAL HISTORY:   Jessica [name] is here today for follow up for recurrent endometrial carcinosarcoma.  Kalea reports mild peripheral neuropathy due to chemotherapy in the form of tingling in the toes and is taking Lyrica  BID. She notes bilateral leg pain lasting a few days after treatment, worsened in her knees. She reports mild nausea lasting a few days after treatment that is well controlled with compazine . Her most significant side effect of chemotherapy is diarrhea which lasts up to 2 weeks after treatment. Jordin notes multiple episodes of uncontrolled  BM's while walking or standing, though she states symptoms improve after taking Imodium.   I have reviewed the past medical history, past surgical history, social history and family history with the  patient and they are unchanged from previous note.  ALLERGIES:  is allergic to carboplatin  and nickel.  MEDICATIONS:  Current Outpatient Medications  Medication Sig Dispense Refill   amLODipine  (NORVASC ) 5 MG tablet Take 1 tablet by mouth once daily 90 tablet 0   atorvastatin  (LIPITOR) 20 MG tablet Take 1 tablet by mouth once daily 90 tablet 0   Cholecalciferol  (VITAMIN D3) 25 MCG (1000 UT) CAPS Take 1 capsule (1,000 Units total) by mouth daily. 60 capsule 2   lidocaine -prilocaine  (EMLA ) cream Apply to port site prior to use 30 g 0   MAGnesium -Oxide 400 (240 Mg) MG tablet Take 1 tablet (400 mg total) by mouth 2 (two) times daily. 60 tablet 3   meclizine  (ANTIVERT ) 25 MG tablet Take 1 tablet (25 mg total) by mouth 3 (three) times daily as needed for dizziness. 30 tablet 3   methocarbamol  (ROBAXIN ) 500 MG tablet Take 1 tablet (500 mg total) by mouth every 6 (six) hours as needed for muscle spasms. 40 tablet 1   nitroGLYCERIN  (NITROSTAT ) 0.4 MG SL tablet Place 1 tablet (0.4 mg total) under the tongue every 5 (five) minutes as needed for chest pain. 25 tablet 3   oxyCODONE  (OXY IR/ROXICODONE ) 5 MG immediate release tablet Take 1 tablet (5 mg total) by mouth 2 (two) times daily as needed for severe pain (pain score 7-10). 60 tablet 0   PACLITAXEL  IV Inject into the vein every 21 ( twenty-one) days.     pregabalin  (LYRICA ) 75 MG capsule Take 1 capsule (75 mg total) by mouth 3 (three) times daily. 90 capsule 3   prochlorperazine  (COMPAZINE ) 10 MG tablet Take 1 tablet (10 mg total) by mouth every 6 (six) hours as needed (Nausea or vomiting). 60 tablet 3   RESTASIS 0.05 % ophthalmic emulsion SMARTSIG:1 Drop(s) In Eye(s) Every 12 Hours     sertraline  (ZOLOFT ) 100 MG tablet Take 1 tablet by mouth once daily 30 tablet 0   No current facility-administered medications for this visit.   Facility-Administered Medications Ordered in Other Visits  Medication Dose Route Frequency Provider Last Rate Last Admin    diphenhydrAMINE  (BENADRYL ) 50 MG/ML injection            palonosetron  (ALOXI ) 0.25 MG/5ML injection             REVIEW OF SYSTEMS:   Constitutional: Denies fevers, chills or abnormal weight loss Eyes: Denies blurriness of vision Ears, nose, mouth, throat, and face: Denies mucositis or sore throat Respiratory: Denies cough, dyspnea or wheezes Cardiovascular: Denies palpitation, chest discomfort or lower extremity swelling Gastrointestinal:  Denies nausea, heartburn or change in bowel habits Skin: Denies abnormal skin rashes Lymphatics: Denies new lymphadenopathy or easy bruising Neurological:Denies numbness, tingling or new weaknesses Behavioral/Psych: Mood is stable, no new changes  All other systems were reviewed with the patient and are negative.   VITALS:  There were no vitals taken for this visit.  Wt Readings from Last 3 Encounters:  10/20/23 230 lb 9.6 oz (104.6 kg)  10/19/23 228 lb 9.6 oz (103.7 kg)  09/29/23 233 lb 9.6 oz (106 kg)    There is no height or weight on file to calculate BMI.  Performance status (ECOG): 2 - Symptomatic, <50% confined to bed  PHYSICAL EXAM:   GENERAL:alert, no distress and comfortable SKIN:  skin color, texture, turgor are normal, no rashes or significant lesions LYMPH:  no palpable lymphadenopathy in the cervical, axillary or inguinal LUNGS: clear to auscultation and percussion with normal breathing effort HEART: regular rate & rhythm and no murmurs and no lower extremity edema ABDOMEN:abdomen soft, non-tender and normal bowel sounds Musculoskeletal:no cyanosis of digits and no clubbing  NEURO: alert & oriented x 3 with fluent speech, no focal motor/sensory deficits  LABORATORY DATA:  I have reviewed the data as listed    Component Value Date/Time   NA 140 10/20/2023 0805   NA 145 (H) 07/07/2023 1027   K 4.0 10/20/2023 0805   CL 107 10/20/2023 0805   CO2 24 10/20/2023 0805   GLUCOSE 103 (H) 10/20/2023 0805   BUN 11 10/20/2023  0805   BUN 14 07/07/2023 1027   CREATININE 0.56 10/20/2023 0805   CALCIUM  9.2 10/20/2023 0805   PROT 6.9 10/20/2023 0805   PROT 7.0 07/07/2023 1027   ALBUMIN 3.8 10/20/2023 0805   ALBUMIN 4.4 07/07/2023 1027   AST 21 10/20/2023 0805   ALT 17 10/20/2023 0805   ALKPHOS 103 10/20/2023 0805   BILITOT 0.4 10/20/2023 0805   BILITOT 0.3 07/07/2023 1027   GFRNONAA >60 10/20/2023 0805   GFRAA >60 10/05/2019 0825    Lab Results  Component Value Date   WBC 8.4 10/20/2023   NEUTROABS 4.5 10/20/2023   HGB 13.2 10/20/2023   HCT 40.8 10/20/2023   MCV 87.9 10/20/2023   PLT 324 10/20/2023     RADIOGRAPHIC STUDIES: I have personally reviewed the radiological images as listed and agreed with the findings in the report.  None new to review

## 2023-10-19 NOTE — Assessment & Plan Note (Signed)
 BP Readings from Last 1 Encounters:  10/19/23 112/73   Well-controlled with amlodipine  5 mg QD Continue to follow low-salt diet and ambulate as tolerated

## 2023-10-19 NOTE — Assessment & Plan Note (Addendum)
 Advised to use Lamisil cream for now to avoid systemic toxicity while she is on chemotherapy for endometrial cancer If persistent, can consider oral terbinafine Advised to discuss with dermatology as well

## 2023-10-20 ENCOUNTER — Inpatient Hospital Stay

## 2023-10-20 ENCOUNTER — Inpatient Hospital Stay (HOSPITAL_BASED_OUTPATIENT_CLINIC_OR_DEPARTMENT_OTHER): Admitting: Oncology

## 2023-10-20 ENCOUNTER — Encounter: Payer: Self-pay | Admitting: Oncology

## 2023-10-20 VITALS — BP 115/72 | HR 81 | Temp 96.8°F | Resp 18

## 2023-10-20 DIAGNOSIS — Z5111 Encounter for antineoplastic chemotherapy: Secondary | ICD-10-CM | POA: Diagnosis not present

## 2023-10-20 DIAGNOSIS — R110 Nausea: Secondary | ICD-10-CM

## 2023-10-20 DIAGNOSIS — G620 Drug-induced polyneuropathy: Secondary | ICD-10-CM

## 2023-10-20 DIAGNOSIS — Z95828 Presence of other vascular implants and grafts: Secondary | ICD-10-CM

## 2023-10-20 DIAGNOSIS — R197 Diarrhea, unspecified: Secondary | ICD-10-CM

## 2023-10-20 DIAGNOSIS — C541 Malignant neoplasm of endometrium: Secondary | ICD-10-CM | POA: Diagnosis not present

## 2023-10-20 DIAGNOSIS — G893 Neoplasm related pain (acute) (chronic): Secondary | ICD-10-CM | POA: Diagnosis not present

## 2023-10-20 DIAGNOSIS — T451X5A Adverse effect of antineoplastic and immunosuppressive drugs, initial encounter: Secondary | ICD-10-CM

## 2023-10-20 LAB — COMPREHENSIVE METABOLIC PANEL WITH GFR
ALT: 17 U/L (ref 0–44)
AST: 21 U/L (ref 15–41)
Albumin: 3.8 g/dL (ref 3.5–5.0)
Alkaline Phosphatase: 103 U/L (ref 38–126)
Anion gap: 9 (ref 5–15)
BUN: 11 mg/dL (ref 6–20)
CO2: 24 mmol/L (ref 22–32)
Calcium: 9.2 mg/dL (ref 8.9–10.3)
Chloride: 107 mmol/L (ref 98–111)
Creatinine, Ser: 0.56 mg/dL (ref 0.44–1.00)
GFR, Estimated: >60 mL/min (ref >60)
Glucose, Bld: 103 mg/dL — ABNORMAL HIGH (ref 70–99)
Potassium: 4 mmol/L (ref 3.5–5.1)
Sodium: 140 mmol/L (ref 135–145)
Total Bilirubin: 0.4 mg/dL (ref 0.0–1.2)
Total Protein: 6.9 g/dL (ref 6.5–8.1)

## 2023-10-20 LAB — CBC WITH DIFFERENTIAL/PLATELET
Abs Immature Granulocytes: 0.02 K/uL (ref 0.00–0.07)
Basophils Absolute: 0.1 K/uL (ref 0.0–0.1)
Basophils Relative: 1 %
Eosinophils Absolute: 0.3 K/uL (ref 0.0–0.5)
Eosinophils Relative: 3 %
HCT: 40.8 % (ref 36.0–46.0)
Hemoglobin: 13.2 g/dL (ref 12.0–15.0)
Immature Granulocytes: 0 %
Lymphocytes Relative: 35 %
Lymphs Abs: 2.9 K/uL (ref 0.7–4.0)
MCH: 28.4 pg (ref 26.0–34.0)
MCHC: 32.4 g/dL (ref 30.0–36.0)
MCV: 87.9 fL (ref 80.0–100.0)
Monocytes Absolute: 0.6 K/uL (ref 0.1–1.0)
Monocytes Relative: 7 %
Neutro Abs: 4.5 K/uL (ref 1.7–7.7)
Neutrophils Relative %: 54 %
Platelets: 324 K/uL (ref 150–400)
RBC: 4.64 MIL/uL (ref 3.87–5.11)
RDW: 15.5 % (ref 11.5–15.5)
WBC: 8.4 K/uL (ref 4.0–10.5)
nRBC: 0 % (ref 0.0–0.2)

## 2023-10-20 LAB — MAGNESIUM: Magnesium: 2 mg/dL (ref 1.7–2.4)

## 2023-10-20 MED ORDER — SODIUM CHLORIDE 0.9 % IV SOLN: Freq: Once | INTRAVENOUS | Status: AC

## 2023-10-20 MED ORDER — DEXAMETHASONE SODIUM PHOSPHATE 10 MG/ML IJ SOLN
10.0000 mg | Freq: Once | INTRAMUSCULAR | Status: AC
  Administered 2023-10-20: 10 mg via INTRAVENOUS
  Filled 2023-10-20: qty 1

## 2023-10-20 MED ORDER — PALONOSETRON HCL INJECTION 0.25 MG/5ML
0.2500 mg | Freq: Once | INTRAVENOUS | Status: AC
  Administered 2023-10-20: 0.25 mg via INTRAVENOUS
  Filled 2023-10-20: qty 5

## 2023-10-20 MED ORDER — FAMOTIDINE IN NACL 20-0.9 MG/50ML-% IV SOLN
20.0000 mg | Freq: Once | INTRAVENOUS | Status: AC
  Administered 2023-10-20: 20 mg via INTRAVENOUS
  Filled 2023-10-20: qty 50

## 2023-10-20 MED ORDER — CETIRIZINE HCL 10 MG/ML IV SOLN
5.0000 mg | Freq: Once | INTRAVENOUS | Status: AC
  Administered 2023-10-20: 5 mg via INTRAVENOUS
  Filled 2023-10-20: qty 1

## 2023-10-20 MED ORDER — SODIUM CHLORIDE 0.9 % IV SOLN
140.0000 mg/m2 | Freq: Once | INTRAVENOUS | Status: AC
  Administered 2023-10-20: 306 mg via INTRAVENOUS
  Filled 2023-10-20: qty 51

## 2023-10-20 NOTE — Patient Instructions (Signed)
 [facility] [place] - A DEPT OF [facility]  Discharge Instructions: Thank you for choosing [facility] to provide your oncology and hematology care.  If you have a lab appointment with the Cancer Center - please note that after April 8th, 2024, all labs will be drawn in the cancer center.  You do not have to check in or register with the main entrance as you have in the past but will complete your check-in in the cancer center.  Wear comfortable clothing and clothing appropriate for easy access to any Portacath or PICC line.   We strive to give you quality time with your provider. You may need to reschedule your appointment if you arrive late (15 or more minutes).  Arriving late affects you and other patients whose appointments are after yours.  Also, if you miss three or more appointments without notifying the office, you may be dismissed from the clinic at the provider's discretion.      For prescription refill requests, have your pharmacy contact our office and allow 72 hours for refills to be completed.    Today you received the following chemotherapy and/or immunotherapy agents Taxol      To help prevent nausea and vomiting after your treatment, we encourage you to take your nausea medication as directed.  Paclitaxel Injection What is this medication? PACLITAXEL (PAK li TAX el) treats some types of cancer. It works by slowing down the growth of cancer cells. This medicine may be used for other purposes; ask your health care provider or pharmacist if you have questions. COMMON BRAND NAME(S): Onxol, Taxol What should I tell my care team before I take this medication? They need to know if you have any of these conditions: Heart disease Liver disease Low white blood cell levels An unusual or allergic reaction to paclitaxel, other medications, foods, dyes, or preservatives If you or your partner are pregnant or trying to get  pregnant Breast-feeding How should I use this medication? This medication is injected into a vein. It is given by your care team in a hospital or clinic setting. Talk to your care team about the use of this medication in children. While it may be given to children for selected conditions, precautions do apply. Overdosage: If you think you have taken too much of this medicine contact a poison control center or emergency room at once. NOTE: This medicine is only for you. Do not share this medicine with others. What if I miss a dose? Keep appointments for follow-up doses. It is important not to miss your dose. Call your care team if you are unable to keep an appointment. What may interact with this medication? Do not take this medication with any of the following: Live virus vaccines Other medications may affect the way this medication works. Talk with your care team about all of the medications you take. They may suggest changes to your treatment plan to lower the risk of side effects and to make sure your medications work as intended. This list may not describe all possible interactions. Give your health care provider a list of all the medicines, herbs, non-prescription drugs, or dietary supplements you use. Also tell them if you smoke, drink alcohol, or use illegal drugs. Some items may interact with your medicine. What should I watch for while using this medication? Your condition will be monitored carefully while you are receiving this medication. You may need blood work while taking this medication. This  medication may make you feel generally unwell. This is not uncommon as chemotherapy can affect healthy cells as well as cancer cells. Report any side effects. Continue your course of treatment even though you feel ill unless your care team tells you to stop. This medication can cause serious allergic reactions. To reduce the risk, your care team may give you other medications to take before  receiving this one. Be sure to follow the directions from your care team. This medication may increase your risk of getting an infection. Call your care team for advice if you get a fever, chills, sore throat, or other symptoms of a cold or flu. Do not treat yourself. Try to avoid being around people who are sick. This medication may increase your risk to bruise or bleed. Call your care team if you notice any unusual bleeding. Be careful brushing or flossing your teeth or using a toothpick because you may get an infection or bleed more easily. If you have any dental work done, tell your dentist you are receiving this medication. Talk to your care team if you may be pregnant. Serious birth defects can occur if you take this medication during pregnancy. Talk to your care team before breastfeeding. Changes to your treatment plan may be needed. What side effects may I notice from receiving this medication? Side effects that you should report to your care team as soon as possible: Allergic reactions--skin rash, itching, hives, swelling of the face, lips, tongue, or throat Heart rhythm changes--fast or irregular heartbeat, dizziness, feeling faint or lightheaded, chest pain, trouble breathing Increase in blood pressure Infection--fever, chills, cough, sore throat, wounds that don't heal, pain or trouble when passing urine, general feeling of discomfort or being unwell Low blood pressure--dizziness, feeling faint or lightheaded, blurry vision Low red blood cell level--unusual weakness or fatigue, dizziness, headache, trouble breathing Painful swelling, warmth, or redness of the skin, blisters or sores at the infusion site Pain, tingling, or numbness in the hands or feet Slow heartbeat--dizziness, feeling faint or lightheaded, confusion, trouble breathing, unusual weakness or fatigue Unusual bruising or bleeding Side effects that usually do not require medical attention (report to your care team if they  continue or are bothersome): Diarrhea Hair loss Joint pain Loss of appetite Muscle pain Nausea Vomiting This list may not describe all possible side effects. Call your doctor for medical advice about side effects. You may report side effects to FDA at 1-800-FDA-1088. Where should I keep my medication? This medication is given in a hospital or clinic. It will not be stored at home. NOTE: This sheet is a summary. It may not cover all possible information. If you have questions about this medicine, talk to your doctor, pharmacist, or health care provider.  2024 Elsevier/Gold Standard (2021-05-28 00:00:00)   BELOW ARE SYMPTOMS THAT SHOULD BE REPORTED IMMEDIATELY: *FEVER GREATER THAN 100.4 F (38 C) OR HIGHER *CHILLS OR SWEATING *NAUSEA AND VOMITING THAT IS NOT CONTROLLED WITH YOUR NAUSEA MEDICATION *UNUSUAL SHORTNESS OF BREATH *UNUSUAL BRUISING OR BLEEDING *URINARY PROBLEMS (pain or burning when urinating, or frequent urination) *BOWEL PROBLEMS (unusual diarrhea, constipation, pain near the anus) TENDERNESS IN MOUTH AND THROAT WITH OR WITHOUT PRESENCE OF ULCERS (sore throat, sores in mouth, or a toothache) UNUSUAL RASH, SWELLING OR PAIN  UNUSUAL VAGINAL DISCHARGE OR ITCHING   Items with * indicate a potential emergency and should be followed up as soon as possible or go to the Emergency Department if any problems should occur.  Please show the CHEMOTHERAPY ALERT  CARD or IMMUNOTHERAPY ALERT CARD at check-in to the Emergency Department and triage nurse.  Should you have questions after your visit or need to cancel or reschedule your appointment, please contact [facility] [place] - A DEPT OF [facility] [phone]  and follow the prompts.  Office hours are 8:00 a.m. to 4:30 p.m. Monday - Friday. Please note that voicemails left after 4:00 p.m. may not be returned until the following business day.  We are closed weekends and major holidays. You have access to a  nurse at all times for urgent questions. Please call the main number to the clinic [phone] and follow the prompts.  For any non-urgent questions, you may also contact your provider using MyChart. We now offer e-Visits for anyone 3 and older to request care online for non-urgent symptoms. For details visit [email or web address].   Also download the MyChart app! Go to the app store, search "MyChart", open the app, select , and log in with your MyChart username and password.

## 2023-10-20 NOTE — Progress Notes (Signed)
 Patient presents today for Paclitaxel  infusion. Patient is in satisfactory condition with no new complaints voiced.  Vital signs are stable.  Labs reviewed by Dr. Davonna during the office visit and all labs are within treatment parameters.  We will proceed with treatment per MD orders.   Treatment given today per MD orders. Tolerated infusion without adverse affects. Vital signs stable. No complaints at this time. Discharged from clinic ambulatory in stable condition. Alert and oriented x 3. F/U with [facility] as scheduled.

## 2023-10-20 NOTE — Assessment & Plan Note (Addendum)
 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  - Labs reviewed today: CMP: Normal creatinine, normal LFTs, CBC: WNL -Physical exam stable today.  Can proceed with chemotherapy - Will obtain a CT scan at 3 months from last scan which is due in 3 weeks. - Can consider chemotherapy holiday if there is a significant response to treatment.  Return to clinic in 3 weeks for follow up with CT scan

## 2023-10-20 NOTE — Assessment & Plan Note (Addendum)
 Diarrhea slightly worsened than before.  Reports more episodes but is manageable with Imodium.  - Continue Imodium as needed for diarrhea management.

## 2023-10-20 NOTE — Assessment & Plan Note (Addendum)
 Leg pain associated with chemotherapy, not related to growth factor injections.  Prefers 5 mg oxycodone  due to sedation with 10 mg.  - Continue 5 mg oxycodone  for leg pain, twice daily as needed

## 2023-10-20 NOTE — Assessment & Plan Note (Addendum)
 Significant nausea managed effectively with Compazine .  No constipation, occasional diarrhea noted.  - Continue Compazine  as needed for nausea management.

## 2023-10-20 NOTE — Assessment & Plan Note (Addendum)
 Neuropathy with tingling on and off in the feet and toes has been stable.  -Continue Lyrica  75 mg twice daily.   -Will closely monitor worsening of neuropathy as we started back on paclitaxel .

## 2023-10-31 ENCOUNTER — Other Ambulatory Visit: Payer: Self-pay

## 2023-10-31 DIAGNOSIS — F321 Major depressive disorder, single episode, moderate: Secondary | ICD-10-CM

## 2023-10-31 DIAGNOSIS — F419 Anxiety disorder, unspecified: Secondary | ICD-10-CM

## 2023-11-02 ENCOUNTER — Ambulatory Visit (HOSPITAL_COMMUNITY)
Admission: RE | Admit: 2023-11-02 | Discharge: 2023-11-02 | Disposition: A | Discharge status: Home / Self Care | Source: Ambulatory Visit | Attending: Oncology | Admitting: Oncology

## 2023-11-02 DIAGNOSIS — C541 Malignant neoplasm of endometrium: Secondary | ICD-10-CM | POA: Diagnosis present

## 2023-11-02 MED ORDER — IOHEXOL 300 MG/ML  SOLN
100.0000 mL | Freq: Once | INTRAMUSCULAR | Status: AC | PRN
  Administered 2023-11-02: 100 mL via INTRAVENOUS

## 2023-11-02 MED ORDER — IOHEXOL 9 MG/ML PO SOLN
500.0000 mL | ORAL | Status: AC
  Administered 2023-11-02: 500 mL via ORAL

## 2023-11-02 MED ORDER — IOHEXOL 9 MG/ML PO SOLN
ORAL | Status: AC
Start: 2023-11-02 — End: 2023-11-02
  Filled 2023-11-02: qty 1000

## 2023-11-10 ENCOUNTER — Inpatient Hospital Stay

## 2023-11-10 ENCOUNTER — Inpatient Hospital Stay (HOSPITAL_BASED_OUTPATIENT_CLINIC_OR_DEPARTMENT_OTHER): Admitting: Oncology

## 2023-11-10 ENCOUNTER — Inpatient Hospital Stay: Attending: Oncology

## 2023-11-10 VITALS — Wt 232.2 lb

## 2023-11-10 DIAGNOSIS — R918 Other nonspecific abnormal finding of lung field: Secondary | ICD-10-CM | POA: Insufficient documentation

## 2023-11-10 DIAGNOSIS — R110 Nausea: Secondary | ICD-10-CM | POA: Diagnosis not present

## 2023-11-10 DIAGNOSIS — C541 Malignant neoplasm of endometrium: Secondary | ICD-10-CM

## 2023-11-10 DIAGNOSIS — T451X5A Adverse effect of antineoplastic and immunosuppressive drugs, initial encounter: Secondary | ICD-10-CM

## 2023-11-10 DIAGNOSIS — Z800 Family history of malignant neoplasm of digestive organs: Secondary | ICD-10-CM | POA: Diagnosis not present

## 2023-11-10 DIAGNOSIS — R971 Elevated cancer antigen 125 [CA 125]: Secondary | ICD-10-CM | POA: Insufficient documentation

## 2023-11-10 DIAGNOSIS — M1990 Unspecified osteoarthritis, unspecified site: Secondary | ICD-10-CM | POA: Insufficient documentation

## 2023-11-10 DIAGNOSIS — R350 Frequency of micturition: Secondary | ICD-10-CM | POA: Diagnosis not present

## 2023-11-10 DIAGNOSIS — Z801 Family history of malignant neoplasm of trachea, bronchus and lung: Secondary | ICD-10-CM | POA: Insufficient documentation

## 2023-11-10 DIAGNOSIS — R197 Diarrhea, unspecified: Secondary | ICD-10-CM

## 2023-11-10 DIAGNOSIS — Z90722 Acquired absence of ovaries, bilateral: Secondary | ICD-10-CM | POA: Diagnosis not present

## 2023-11-10 DIAGNOSIS — Z9221 Personal history of antineoplastic chemotherapy: Secondary | ICD-10-CM | POA: Insufficient documentation

## 2023-11-10 DIAGNOSIS — Z90710 Acquired absence of both cervix and uterus: Secondary | ICD-10-CM | POA: Diagnosis not present

## 2023-11-10 DIAGNOSIS — Z95828 Presence of other vascular implants and grafts: Secondary | ICD-10-CM

## 2023-11-10 DIAGNOSIS — Z923 Personal history of irradiation: Secondary | ICD-10-CM | POA: Diagnosis not present

## 2023-11-10 DIAGNOSIS — G620 Drug-induced polyneuropathy: Secondary | ICD-10-CM

## 2023-11-10 DIAGNOSIS — G629 Polyneuropathy, unspecified: Secondary | ICD-10-CM | POA: Diagnosis not present

## 2023-11-10 DIAGNOSIS — C786 Secondary malignant neoplasm of retroperitoneum and peritoneum: Secondary | ICD-10-CM | POA: Insufficient documentation

## 2023-11-10 DIAGNOSIS — Z8041 Family history of malignant neoplasm of ovary: Secondary | ICD-10-CM | POA: Insufficient documentation

## 2023-11-10 DIAGNOSIS — I10 Essential (primary) hypertension: Secondary | ICD-10-CM | POA: Insufficient documentation

## 2023-11-10 DIAGNOSIS — Z803 Family history of malignant neoplasm of breast: Secondary | ICD-10-CM | POA: Diagnosis not present

## 2023-11-10 DIAGNOSIS — F32A Depression, unspecified: Secondary | ICD-10-CM | POA: Insufficient documentation

## 2023-11-10 DIAGNOSIS — C775 Secondary and unspecified malignant neoplasm of intrapelvic lymph nodes: Secondary | ICD-10-CM | POA: Diagnosis present

## 2023-11-10 DIAGNOSIS — F419 Anxiety disorder, unspecified: Secondary | ICD-10-CM | POA: Diagnosis not present

## 2023-11-10 DIAGNOSIS — Z9079 Acquired absence of other genital organ(s): Secondary | ICD-10-CM | POA: Diagnosis not present

## 2023-11-10 DIAGNOSIS — E785 Hyperlipidemia, unspecified: Secondary | ICD-10-CM | POA: Diagnosis not present

## 2023-11-10 DIAGNOSIS — Z79899 Other long term (current) drug therapy: Secondary | ICD-10-CM | POA: Insufficient documentation

## 2023-11-10 DIAGNOSIS — Z8542 Personal history of malignant neoplasm of other parts of uterus: Secondary | ICD-10-CM | POA: Insufficient documentation

## 2023-11-10 LAB — CBC WITH DIFFERENTIAL/PLATELET
Abs Immature Granulocytes: 0.02 K/uL (ref 0.00–0.07)
Basophils Absolute: 0.1 K/uL (ref 0.0–0.1)
Basophils Relative: 1 %
Eosinophils Absolute: 0.3 K/uL (ref 0.0–0.5)
Eosinophils Relative: 3 %
HCT: 39.6 % (ref 36.0–46.0)
Hemoglobin: 13.2 g/dL (ref 12.0–15.0)
Immature Granulocytes: 0 %
Lymphocytes Relative: 30 %
Lymphs Abs: 2.4 K/uL (ref 0.7–4.0)
MCH: 28.9 pg (ref 26.0–34.0)
MCHC: 33.3 g/dL (ref 30.0–36.0)
MCV: 86.8 fL (ref 80.0–100.0)
Monocytes Absolute: 0.7 K/uL (ref 0.1–1.0)
Monocytes Relative: 9 %
Neutro Abs: 4.7 K/uL (ref 1.7–7.7)
Neutrophils Relative %: 57 %
Platelets: 322 K/uL (ref 150–400)
RBC: 4.56 MIL/uL (ref 3.87–5.11)
RDW: 15.3 % (ref 11.5–15.5)
WBC: 8.2 K/uL (ref 4.0–10.5)
nRBC: 0 % (ref 0.0–0.2)

## 2023-11-10 LAB — COMPREHENSIVE METABOLIC PANEL WITH GFR
ALT: 16 U/L (ref 0–44)
AST: 18 U/L (ref 15–41)
Albumin: 4.3 g/dL (ref 3.5–5.0)
Alkaline Phosphatase: 115 U/L (ref 38–126)
Anion gap: 11 (ref 5–15)
BUN: 15 mg/dL (ref 6–20)
CO2: 26 mmol/L (ref 22–32)
Calcium: 9.2 mg/dL (ref 8.9–10.3)
Chloride: 104 mmol/L (ref 98–111)
Creatinine, Ser: 0.61 mg/dL (ref 0.44–1.00)
GFR, Estimated: >60 mL/min (ref >60)
Glucose, Bld: 120 mg/dL — ABNORMAL HIGH (ref 70–99)
Potassium: 4 mmol/L (ref 3.5–5.1)
Sodium: 141 mmol/L (ref 135–145)
Total Bilirubin: 0.3 mg/dL (ref 0.0–1.2)
Total Protein: 6.9 g/dL (ref 6.5–8.1)

## 2023-11-10 LAB — MAGNESIUM: Magnesium: 1.9 mg/dL (ref 1.7–2.4)

## 2023-11-10 NOTE — Addendum Note (Signed)
 Addended by: Michalle Rademaker on: 11/10/2023 11:26 PM   Modules accepted: Orders

## 2023-11-10 NOTE — Progress Notes (Signed)
 Patient Care Team: [name], FNP as PCP - General (Family Medicine) Mallipeddi, [name], MD as PCP - Cardiology (Cardiology) Wilson, [name], RN as Oncology Nurse Navigator  Clinic Day:  09/07/2023  Referring physician: [name], FNP   CHIEF COMPLAINT:  CC: Recurrent endometrial carcinosarcoma    ASSESSMENT & PLAN:   Assessment & Plan: Jessica [name]  is a 63 y.o. 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  There is significant increase in the left external iliac lymph node.  Vaginal cuff area could not be assessed due to artifact.  There is also very tiny multiple pulmonary nodules that are 2 mm. -Considering disease progression.  Will discontinue paclitaxel  at this time. - Considering patient has only disease at one location, Will refer to radiation oncology for radiation of the iliac lymph node. - Will refer to gynecologic oncology for assessment and biopsy of the vaginal cuff area.  If this is consistent with recurrence, will consider systemic treatment with next lines pembrolizumab + lenvatinib or even can consider doxorubicin - Labs reviewed today: CMP: Normal creatinine, normal LFTs, CBC: WNL -Physical exam stable today.  - Will obtain blood guardant 360 today   Return to clinic after evaluation by radiation oncology and gynecological oncology to discuss further management.  Peripheral neuropathy Peripheral neuropathy likely related to chemotherapy, with symptoms of numbness and tingling in the toes.  - Continue Lyrica  75 mg twice daily for peripheral  neuropathy.  Pain in both legs Excruciating pain in both legs, possibly related to muscle cramps or neuropathy.  - Continue current pain management regimen as needed.  Oxycodone  5 mg twice daily as needed.  Chemotherapy-induced nausea and diarrhea Intermittent nausea and diarrhea managed with Compazine  and Imodium, respectively. Cautious Imodium use to avoid constipation.  - Continue Compazine  for nausea as needed. - Use Imodium for diarrhea as needed, with caution to avoid constipation.   The patient understands the plans discussed today and is in agreement with them.  She knows to contact our office if she develops concerns prior to her next appointment.  The total time spent in the appointment was 40 minutes for the encounter with patient, including review of chart and various tests results, discussions about plan of care and coordination of care plan  Mickiel Dry, MD  [facility] [place] - A DEPT OF [name] [address] [place] [address] Dept: [phone] Dept Fax: [phone]   No orders of the defined types were placed in this encounter.    ONCOLOGY HISTORY:   I have reviewed her chart and materials related to her cancer extensively and collaborated history with the patient. Summary of oncologic history is as follows:   Diagnosis: Recurrent endometrial carcinosarcoma   -03/29/2018: Cervical biopsy at 5 o'clock, 7 o'clock, and 12 o'clock.  -Pathology: Carcinosarcoma (malignant mixed mullerian tumor). Much of the carcinomatous component has clear cell features. -04/13/2018: TAH, BSO, bilateral pelvic and para-aortic lymph node resections.  Pathology: High-grade carcinosarcoma (malignant mixed mullerian tumor) arising in an endometrial polyp with no myometrial or serosal invasion identified. 0/39 lymph nodes positive. pT1a pN0, FIGO stage Ia. MMR preserved. MSI-stable.  -  12/09/2018 CT AP: Fairly extensive  aggressive and new abdominal/pelvic carcinomatosis consistent with recurrent endometrial cancer. Associated small volume abdominal/pelvic ascites. No findings for bowel involvement or bowel obstruction. -12/22/2018 CA 125 elevated at 127.0 -12/28/2018 Omental mass biopsy. Pathology: Poorly differentiated carcinoma.  -Comment: Immunohistochemistry is positive for PAX 8 and ER (weak). CK7, CK20,  TTF-1, CDX-2 and GATA-3 are negative. -12/29/2018-10/05/2019: Carboplatin  and paclitaxel . Patient developed severe allergic reaction during cycle 14 with carboplatin  and was discontinued -05/02/2019: CT CAP:  Interval response to therapy as evidenced by decrease in size of peritoneal metastases. -07/04/2019: CT AP: Continued positive response to therapy. Scattered peritoneal metastases in the pelvis and left omentum are decreased. No new or progressive metastatic disease in the abdomen or pelvis. -10/24/2019: CT AP: Substantial reduction in size of soft tissue nodules in the pelvis compatible with positive response to therapy. Faint stranding in the left omentum at site of prior nodularity. -11/09/2019-08/22/2021: Single agent paclitaxel  started,01/18/2020 paclitaxel  dose reduced by 20%,  treatment held on 08/22/2021 for chemo holiday -01/10/2020-09/30/2022: CT AP: Stable disease - 04/06/2023:CT AP: New defined 2.2 cm soft tissue density along the superior and right lateral margin of the vaginal cuff, suspicious for local recurrence. Increased size of 11 mm lymph node or peritoneal nodule adjacent to the left external iliac vessels, consistent with metastatic disease. Stable 3 mm nodule or lymph node in sigmoid mesocolon. -04/20/2023: [id number]: MPL [id number]. No other targetable mutations.  -05/18/2023: Left external iliac lymph node biopsy.  -Pathology: Poorly differentiated malignant neoplasm with dual cell population and areas of squamous differentiation.  -Comment: The epithelioid component shows squamous  differentiation and shows labeling for cytokeratin AE1/AE3, p63 and CK5/6.  The more spindled component shows focal labeling for p63 but is negative for cytokeratin AE1/AE3 and CK5/6.  Immunostain for PAX8 shows only weak nonspecific labeling in both components.  -05/18/2023: [id number]: Not enough tissue. HER2 by IHC (1+).  -08/11/2023: CT AP: Increased size of the left external iliac lymph node now measuring 16 mm in short axis previously 11 mm, concerning for progressive nodal disease. Similar soft tissue nodularity along the right vaginal cuff measuring 2.1 cm previously 2.2 cm. -08/18/2023- 10/20/2023: Paclitaxel  140 mg/m every 3 weeks (restarted).  Discontinued for progression. - 09/02/2023: CT CAP: Continued interval enlargement of a left iliac lymph node or soft tissue nodule measuring 3.1 x 3.0 cm, previously 1.8 x 1.6 cm, consistent with worsened metastatic disease. Previously described soft tissue at the right aspect of the vaginal cuff is obscured on today's examination by dense metallic streak artifact from adjacent arthroplasty. New, very tiny nodules in the left lung base measuring up to 0.2 cm, nonspecific and most likely infectious or inflammatory but warranting close attention on follow-up in the setting of established metastatic disease.   Current Treatment:  Paclitaxel  140 mg/m every 3 weeks  INTERVAL HISTORY:   Discussed the use of AI scribe software for clinical note transcription with the patient, who gave verbal consent to proceed.  History of Present Illness Riyan Gavina is a 63 year old female with endometrial carcinoma sarcoma presenting for follow-up.  She is accompanied by her daughter today.  We discussed the recent CT scan findings in detail.  Lung lesions are currently too small to determine their nature, and there is a concern about a spot in the vaginal area that requires further evaluation by a gynecological oncologist.  She experiences excruciating leg  pain, described as reaching a pain level of 'fifteen', occurring in both legs and  sometimes waking her at night. She takes one or two pain medication pills a day, particularly two to three days after chemotherapy. She also experiences random nausea and diarrhea, for which she takes Compazine  and Imodium, respectively, but avoids daily use of Imodium to prevent constipation.  She reports numbness and tingling in her toes, which is unexpected given her long-term use of chemotherapy. She is currently taking Lyrica  twice a day for peripheral neuropathy. Despite these symptoms, she does not have pain today.  She lives five minutes away from the medical facility, which is convenient for her treatment schedule. She expresses feelings of worry, sadness, and fear about her condition, but overall feels okay.   I have reviewed the past medical history, past surgical history, social history and family history with the patient and they are unchanged from previous note.  ALLERGIES:  is allergic to carboplatin  and nickel.  MEDICATIONS:  Current Outpatient Medications  Medication Sig Dispense Refill   amLODipine  (NORVASC ) 5 MG tablet Take 1 tablet by mouth once daily 90 tablet 0   atorvastatin  (LIPITOR) 20 MG tablet Take 1 tablet by mouth once daily 90 tablet 0   Cholecalciferol  (VITAMIN D3) 25 MCG (1000 UT) CAPS Take 1 capsule (1,000 Units total) by mouth daily. 60 capsule 2   lidocaine -prilocaine  (EMLA ) cream Apply to port site prior to use 30 g 0   MAGnesium -Oxide 400 (240 Mg) MG tablet Take 1 tablet (400 mg total) by mouth 2 (two) times daily. 60 tablet 3   meclizine  (ANTIVERT ) 25 MG tablet Take 1 tablet (25 mg total) by mouth 3 (three) times daily as needed for dizziness. 30 tablet 3   methocarbamol  (ROBAXIN ) 500 MG tablet Take 1 tablet (500 mg total) by mouth every 6 (six) hours as needed for muscle spasms. 40 tablet 1   nitroGLYCERIN  (NITROSTAT ) 0.4 MG SL tablet Place 1 tablet (0.4 mg total) under the  tongue every 5 (five) minutes as needed for chest pain. 25 tablet 3   oxyCODONE  (OXY IR/ROXICODONE ) 5 MG immediate release tablet Take 1 tablet (5 mg total) by mouth 2 (two) times daily as needed for severe pain (pain score 7-10). 60 tablet 0   PACLITAXEL  IV Inject into the vein every 21 ( twenty-one) days.     pregabalin  (LYRICA ) 75 MG capsule Take 1 capsule (75 mg total) by mouth 3 (three) times daily. 90 capsule 3   prochlorperazine  (COMPAZINE ) 10 MG tablet Take 1 tablet (10 mg total) by mouth every 6 (six) hours as needed (Nausea or vomiting). 60 tablet 3   RESTASIS 0.05 % ophthalmic emulsion SMARTSIG:1 Drop(s) In Eye(s) Every 12 Hours     sertraline  (ZOLOFT ) 100 MG tablet Take 1 tablet by mouth once daily 30 tablet 0   No current facility-administered medications for this visit.   Facility-Administered Medications Ordered in Other Visits  Medication Dose Route Frequency Provider Last Rate Last Admin   diphenhydrAMINE  (BENADRYL ) 50 MG/ML injection            palonosetron  (ALOXI ) 0.25 MG/5ML injection             REVIEW OF SYSTEMS:   Constitutional: Denies fevers, chills or abnormal weight loss Eyes: Denies blurriness of vision Ears, nose, mouth, throat, and face: Denies mucositis or sore throat Respiratory: Denies cough, dyspnea or wheezes Cardiovascular: Denies palpitation, chest discomfort or lower extremity swelling Gastrointestinal:  Denies nausea, heartburn or change in bowel habits Skin: Denies abnormal skin rashes Lymphatics: Denies new lymphadenopathy or easy bruising Neurological:Denies numbness,  tingling or new weaknesses Behavioral/Psych: Mood is stable, no new changes  All other systems were reviewed with the patient and are negative.   VITALS:  Weight 232 lb 3.2 oz (105.3 kg).  Wt Readings from Last 3 Encounters:  11/10/23 232 lb 3.2 oz (105.3 kg)  10/20/23 230 lb 9.6 oz (104.6 kg)  10/19/23 228 lb 9.6 oz (103.7 kg)    Body mass index is 39.86  kg/m.  Performance status (ECOG): 2 - Symptomatic, <50% confined to bed  PHYSICAL EXAM:   GENERAL:alert, no distress and comfortable SKIN: skin color, texture, turgor are normal, no rashes or significant lesions LYMPH:  no palpable lymphadenopathy in the cervical, axillary or inguinal LUNGS: clear to auscultation and percussion with normal breathing effort HEART: regular rate & rhythm and no murmurs and no lower extremity edema ABDOMEN:abdomen soft, non-tender and normal bowel sounds Musculoskeletal:no cyanosis of digits and no clubbing  NEURO: alert & oriented x 3 with fluent speech, no focal motor/sensory deficits  LABORATORY DATA:  I have reviewed the data as listed    Component Value Date/Time   NA 141 11/10/2023 0843   NA 145 (H) 07/07/2023 1027   K 4.0 11/10/2023 0843   CL 104 11/10/2023 0843   CO2 26 11/10/2023 0843   GLUCOSE 120 (H) 11/10/2023 0843   BUN 15 11/10/2023 0843   BUN 14 07/07/2023 1027   CREATININE 0.61 11/10/2023 0843   CALCIUM  9.2 11/10/2023 0843   PROT 6.9 11/10/2023 0843   PROT 7.0 07/07/2023 1027   ALBUMIN 4.3 11/10/2023 0843   ALBUMIN 4.4 07/07/2023 1027   AST 18 11/10/2023 0843   ALT 16 11/10/2023 0843   ALKPHOS 115 11/10/2023 0843   BILITOT 0.3 11/10/2023 0843   BILITOT 0.3 07/07/2023 1027   GFRNONAA >60 11/10/2023 0843   GFRAA >60 10/05/2019 0825    Lab Results  Component Value Date   WBC 8.2 11/10/2023   NEUTROABS 4.7 11/10/2023   HGB 13.2 11/10/2023   HCT 39.6 11/10/2023   MCV 86.8 11/10/2023   PLT 322 11/10/2023     RADIOGRAPHIC STUDIES: I have personally reviewed the radiological images as listed and agreed with the findings in the report.  CT CHEST ABDOMEN PELVIS W CONTRAST CLINICAL DATA:  High risk endometrial cancer monitoring * Tracking Code: BO *  EXAM: CT CHEST, ABDOMEN, AND PELVIS WITH CONTRAST  TECHNIQUE: Multidetector CT imaging of the chest, abdomen and pelvis was performed following the standard protocol  during bolus administration of intravenous contrast.  RADIATION DOSE REDUCTION: This exam was performed according to the departmental dose-optimization program which includes automated exposure control, adjustment of the mA and/or kV according to patient size and/or use of iterative reconstruction technique.  CONTRAST:  OMNIPAQUE  IOHEXOL  300 MG/ML  SOLN  COMPARISON:  CT abdomen pelvis, 08/11/2023, CT chest, 06/10/2023  FINDINGS: CT CHEST FINDINGS  Cardiovascular: Right chest port catheter. Scattered aortic atherosclerosis. Normal heart size. No pericardial effusion.  Mediastinum/Nodes: No enlarged mediastinal, hilar, or axillary lymph nodes. Small hiatal hernia. Thyroid  gland, trachea, and esophagus demonstrate no significant findings.  Lungs/Pleura: New, very tiny nodules in the left lung base measuring up to 0.2 cm (series 3, image 100, 89). Unchanged tiny calcified pulmonary nodules and a benign fissural nodule of the right lower lobe abutting the major fissure (series 3, image 70). No pleural effusion or pneumothorax.  Musculoskeletal: No chest wall abnormality. No acute osseous findings.  CT ABDOMEN PELVIS FINDINGS  Hepatobiliary: No solid liver abnormality is seen. Faintly  calcified gallstones (series 2, image 58). Gallbladder wall thickening, or biliary dilatation.  Pancreas: Unremarkable. No pancreatic ductal dilatation or surrounding inflammatory changes.  Spleen: Normal in size without significant abnormality.  Adrenals/Urinary Tract: Adrenal glands are unremarkable. Kidneys are normal, without renal calculi, solid lesion, or hydronephrosis. Bladder is unremarkable.  Stomach/Bowel: Stomach is within normal limits. Appendix appears normal. No evidence of bowel wall thickening, distention, or inflammatory changes.  Vascular/Lymphatic: Aortic atherosclerosis. Continued interval enlargement of a left iliac lymph node or soft tissue nodule measuring 3.1  x 3.0 cm, previously 1.8 x 1.6 cm (series 2, image 99).  Reproductive: Hysterectomy and oophorectomy. Previously described soft tissue at the right aspect of the vaginal cuff is obscured on today's examination by dense metallic streak artifact from adjacent arthroplasty (series 2, image 107).  Other: No abdominal wall hernia or abnormality. No ascites.  Musculoskeletal: No acute osseous findings. Right hip total arthroplasty.  IMPRESSION: 1. Continued interval enlargement of a left iliac lymph node or soft tissue nodule measuring 3.1 x 3.0 cm, previously 1.8 x 1.6 cm, consistent with worsened metastatic disease. 2. Previously described soft tissue at the right aspect of the vaginal cuff is obscured on today's examination by dense metallic streak artifact from adjacent arthroplasty. 3. New, very tiny nodules in the left lung base measuring up to 0.2 cm, nonspecific and most likely infectious or inflammatory but warranting close attention on follow-up in the setting of established metastatic disease. 4. Cholelithiasis.  Aortic Atherosclerosis (ICD10-I70.0).  Electronically Signed   By: Marolyn JONETTA Jaksch M.D.   On: 11/03/2023 14:22

## 2023-11-10 NOTE — Patient Instructions (Addendum)
 [facility] at [facility] Discharge Instructions   You were seen and examined today by Dr. Davonna.  She reviewed the results of your lab work which are normal/stable.   She reviewed the results of your CT scan which shows the left iliac lymph node is growing. We will refer you to radiation oncology for evaluation for radiation treatment to this area to get it under control. We will also refer you to a gynecological oncologist for evaluation and possible biopsy of your vaginal cuff.   We will hold your treatment today.   Return as scheduled.    Thank you for choosing [facility] at [facility] to provide your oncology and hematology care.  To afford each patient quality time with our provider, please arrive at least 15 minutes before your scheduled appointment time.   If you have a lab appointment with the Cancer Center please come in thru the Main Entrance and check in at the main information desk.  You need to re-schedule your appointment should you arrive 10 or more minutes late.  We strive to give you quality time with our providers, and arriving late affects you and other patients whose appointments are after yours.  Also, if you no show three or more times for appointments you may be dismissed from the clinic at the providers discretion.     Again, thank you for choosing [facility].  Our hope is that these requests will decrease the amount of time that you wait before being seen by our physicians.       _____________________________________________________________  Should you have questions after your visit to [facility], please contact our office at [phone] and follow the prompts.  Our office hours are 8:00 a.m. and 4:30 p.m. Monday - Friday.  Please note that voicemails left after 4:00 p.m. may not be returned until the following business day.  We are closed weekends and major holidays.  You do have access  to a nurse 24-7, just call the main number to the clinic [phone] and do not press any options, hold on the line and a nurse will answer the phone.    For prescription refill requests, have your pharmacy contact our office and allow 72 hours.    Due to Covid, you will need to wear a mask upon entering the hospital. If you do not have a mask, a mask will be given to you at the Main Entrance upon arrival. For doctor visits, patients may have 1 support person age 15 or older with them. For treatment visits, patients can not have anyone with them due to social distancing guidelines and our immunocompromised population.

## 2023-11-10 NOTE — Progress Notes (Signed)
 No treatment today per Dr. Davonna.

## 2023-11-11 ENCOUNTER — Encounter: Payer: Self-pay | Admitting: Gynecologic Oncology

## 2023-11-11 ENCOUNTER — Telehealth: Payer: Self-pay | Admitting: *Deleted

## 2023-11-11 NOTE — Telephone Encounter (Signed)
 Spoke with the patient regarding the referral to GYN oncology. Patient scheduled as new patient with Dr Viktoria 10/30 at 11:15 am. Patient given an arrival time of 10:45 am.  Explained to the patient the the doctor will perform a pelvic exam at this visit. Patient given the policy that only one visitor allowed and that visitor must be over 16 yrs are allowed in the Cancer Center. Patient given the address/phone number for the clinic and that the center offers free valet service. Patient aware that masks optional.

## 2023-11-18 NOTE — Progress Notes (Signed)
 GYNECOLOGIC ONCOLOGY NEW PATIENT CONSULTATION   Patient Name: Jessica [name]  Patient Age: 63 y.o. Date of Service: 11/20/23 Referring Provider: Mickiel Dry, MD  Primary Care Provider: [name], FNP Consulting Provider: Comer Dollar, MD   Assessment/Plan:  Postmenopausal patient with recurrent and progressive uterine carcinosarcoma.  I spent some time speaking with the patient and her daughter about her cancer course.  In the setting of a high-grade aggressive uterine cancer histology, she has done remarkably well with good disease control using systemic treatments.  She has had fairly good control over a long period of time on Taxol  alone.  Most recent imaging shows increasing size of an external iliac lymph node versus tumor deposit.  Her scan is also suspicious for there being cancer recurrence at the vaginal cuff.  On exam today, there is thickening along the cuff but no discernible mass or nodularity.  We discussed the importance of determining whether she has unifocal versus multifocal disease.  If imaging confirms or suggest multifocal disease, I would favor systemic therapy.  If it appears that her only site of disease is the tumor implant along the external iliac, it would certainly be reasonable to pursue local radiation.  I worry some about toxicity to her in the setting of some GI issues already.  We could also consider pelvic MRI and surgery although we discussed that this would not be considered standard treatment in the setting of her aggressive uterine histology, albeit one that is fairly indolent acting.  Patient was amenable to proceeding with PET scan.  This was scheduled today.  She meets with Dr. Dannielle, radiation oncology with [facility] tomorrow.  We had some difficulty seeing her somatic testing.  Ultimately, my office was able to find results of her Guardant360 which showed microsatellite stable tumor, no actionable mutations.  Uterine carcinosarcoma, was not  included in keynote 775.  There are some retrospective literature to support that pembrolizumab and Lenvima are more active in uterine carcinosarcoma than other traditional cytotoxic regimens.  There are retrospective data show that this may be an active combination for uterine carcinosarcoma. This uterine histology was not included in keynote 775.  Given her GI issues, notably her diarrhea (with no prior colonoscopy), I recommended colonoscopy for further evaluation.  Referral to GI placed.  We also discussed referral to our genetic counselors for germline genetic testing.  Daughter says that she has been tested and did not have a pathogenic mutation.  A copy of this note was sent to the patient's referring provider.   70 minutes of total time was spent for this patient encounter, including preparation, face-to-face counseling with the patient and coordination of care, and documentation of the encounter.  Comer Dollar, MD  Division of Gynecologic Oncology  Department of Obstetrics and Gynecology  [facility]  ___________________________________________  Chief Complaint: No chief complaint on file.   History of Present Illness:  Jessica [name] is a 63 y.o. y.o. female who is seen in consultation at the request of Dr. Dry for an evaluation of progressive uterine carcinosarcoma.  Below is the summary of her history: -03/29/2018: Cervical biopsy at 5 o'clock, 7 o'clock, and 12 o'clock.  Pathology: Carcinosarcoma (malignant mixed mullerian tumor). Much of the carcinomatous component has clear cell features. -04/13/2018: TAH, BSO, bilateral pelvic and para-aortic lymph node resections.  Pathology: High-grade carcinosarcoma (malignant mixed mullerian tumor) arising in an endometrial polyp with no myometrial or serosal invasion identified. 0/39 lymph nodes positive. pT1a pN0, FIGO stage  Ia. MMR preserved. MSI-stable.  -12/09/2018 CT AP: Fairly extensive  aggressive and new abdominal/pelvic carcinomatosis consistent with recurrent endometrial cancer. Associated small volume abdominal/pelvic ascites. No findings for bowel involvement or bowel obstruction. -12/22/2018 CA 125 elevated at 127.0 -12/28/2018 Omental mass biopsy.  Pathology: Poorly differentiated carcinoma. Comment: IHC is positive for PAX 8 and ER (weak). CK7, CK20, TTF-1, CDX-2 and GATA-3 are negative. -12/29/2018-10/05/2019: Carboplatin  and paclitaxel . Patient developed severe allergic reaction during cycle 14 with carboplatin  and was discontinued -05/02/2019: CT CAP:  Interval response to therapy as evidenced by decrease in size of peritoneal metastases. -07/04/2019: CT AP: Continued positive response to therapy. Scattered peritoneal metastases in the pelvis and left omentum are decreased. No new or progressive metastatic disease in the abdomen or pelvis. -10/24/2019: CT AP: Substantial reduction in size of soft tissue nodules in the pelvis compatible with positive response to therapy. Faint stranding in the left omentum at site of prior nodularity. -11/09/2019-08/22/2021: Single agent paclitaxel  started,01/18/2020 paclitaxel  dose reduced by 20%, treatment held on 08/22/2021 for chemo holiday -01/10/2020-09/30/2022: CT AP: Stable disease - 04/06/2023:CT AP: New defined 2.2 cm soft tissue density along the superior and right lateral margin of the vaginal cuff, suspicious for local recurrence. Increased size of 11 mm lymph node or peritoneal nodule adjacent to the left external iliac vessels, consistent with metastatic disease. Stable 3 mm nodule or lymph node in sigmoid mesocolon.  Within notes, there is mention of Guardant360 testing showing mutation in [id number]. No other targetable mutations. I don't see this report in the patient's record.   -05/18/2023: Left external iliac lymph node biopsy.  Pathology: Poorly differentiated malignant neoplasm with dual cell population and areas of squamous  differentiation.  Comment: The epithelioid component shows squamous differentiation and shows labeling for cytokeratin AE1/AE3, p63 and CK5/6.  The more spindled component shows focal labeling for p63 but is negative for cytokeratin AE1/AE3 and CK5/6. Immunostain for PAX8 shows only weak nonspecific labeling in both components.  -05/18/2023: HER2 by IHC (1+).  -08/11/2023: CT AP: Increased size of the left external iliac lymph node now measuring 16 mm in short axis previously 11 mm, concerning for progressive nodal disease. Similar soft tissue nodularity along the right vaginal cuff measuring 2.1 cm previously 2.2 cm. -08/18/2023- 10/20/2023: Paclitaxel  140 mg/m every 3 weeks (restarted).  Discontinued for progression. - 09/02/2023: CT CAP: Continued interval enlargement of a left iliac lymph node or soft tissue nodule measuring 3.1 x 3.0 cm, previously 1.8 x 1.6 cm, consistent with worsened metastatic disease. Previously described soft tissue at the right aspect of the vaginal cuff is obscured on today's examination by dense metallic streak artifact from adjacent arthroplasty. New, very tiny nodules in the left lung base measuring up to 0.2 cm, nonspecific and most likely infectious or inflammatory but warranting close attention on follow-up in the setting of established metastatic disease.  Patient presents today with her daughter.  She notes overall doing well.  Endorses good appetite.  Continues to have frequent nausea, which she describes as nausea most mornings.  Treats this with antiemetics with good relief.  Since her cancer diagnosis, she has developed diarrhea which is sporadic although frequent, sometimes does not feel the urge to defecate.  Uses Imodium.  Also has some urinary frequency.  Denies any vaginal bleeding or discharge.  PAST MEDICAL HISTORY:  Past Medical History:  Diagnosis Date   Allergy    Anemia    Anxiety    Arthritis    Cancer (HCC)  [employer] 09/05/2019   Cancer of skin  of back    Chest pain    Depression    Elevated blood pressure reading    Endometrial cancer (HCC)    Hyperlipidemia    Hypertension    PMB (postmenopausal bleeding)    Port-A-Cath in place 12/24/2018   Pre-diabetes      PAST SURGICAL HISTORY:  Past Surgical History:  Procedure Laterality Date   IR IMAGING GUIDED PORT INSERTION  12/28/2018   IR RADIOLOGIST EVAL & MGMT  06/02/2018   IR RADIOLOGIST EVAL & MGMT  06/16/2018   NECK SURGERY  2000   spinal surgery , titanium rod in place    ROBOTIC ASSISTED TOTAL HYSTERECTOMY WITH BILATERAL SALPINGO OOPHERECTOMY N/A 04/13/2018   Procedure: XI ROBOTIC ASSISTED TOTAL HYSTERECTOMY WITH BILATERAL SALPINGO OOPHORECTOMY;  Surgeon: Eloy Herring, MD;  Location: [facility];  Service: Gynecology;  Laterality: N/A;   ROBOTIC PELVIC AND PARA-AORTIC LYMPH NODE DISSECTION N/A 04/13/2018   Procedure: XI ROBOTIC PELVIC LYMPHADECTOMY AND PARA-AORTIC LYMPH NODE DISSECTION;  Surgeon: Eloy Herring, MD;  Location: [facility];  Service: Gynecology;  Laterality: N/A;   TOTAL HIP ARTHROPLASTY Right 09/19/2022   Procedure: RIGHT TOTAL HIP ARTHROPLASTY ANTERIOR APPROACH;  Surgeon: Vernetta Lonni GRADE, MD;  Location: [facility];  Service: Orthopedics;  Laterality: Right;    OB/GYN HISTORY:  OB History  Gravida Para Term Preterm AB Living  4 3 3  0 1 3  SAB IAB Ectopic Multiple Live Births  0 1 0 0 3    # Outcome Date GA Lbr Len/2nd Weight Sex Type Anes PTL Lv  4 IAB           3 Term      Vag-Spont     2 Term      Vag-Spont     1 Term      Vag-Spont       No LMP recorded. Patient has had a hysterectomy.  Age at menarche: 23  Age at menopause: in 76s Hx of HRT: denies Hx of STDs: denies Last pap: unsure History of abnormal pap smears: denies  SCREENING STUDIES:  Last mammogram: 2025 Colonoscopy: has not had, did cologuard   MEDICATIONS: Outpatient Encounter Medications as of 11/19/2023  Medication Sig   amLODipine  (NORVASC ) 5 MG tablet Take 1 tablet by  mouth once daily   atorvastatin  (LIPITOR) 20 MG tablet Take 1 tablet by mouth once daily   Cholecalciferol  (VITAMIN D3) 25 MCG (1000 UT) CAPS Take 1 capsule (1,000 Units total) by mouth daily.   MAGnesium -Oxide 400 (240 Mg) MG tablet Take 1 tablet (400 mg total) by mouth 2 (two) times daily.   meclizine  (ANTIVERT ) 25 MG tablet Take 1 tablet (25 mg total) by mouth 3 (three) times daily as needed for dizziness.   methocarbamol  (ROBAXIN ) 500 MG tablet Take 1 tablet (500 mg total) by mouth every 6 (six) hours as needed for muscle spasms.   nitroGLYCERIN  (NITROSTAT ) 0.4 MG SL tablet Place 1 tablet (0.4 mg total) under the tongue every 5 (five) minutes as needed for chest pain.   oxyCODONE  (OXY IR/ROXICODONE ) 5 MG immediate release tablet Take 1 tablet (5 mg total) by mouth 2 (two) times daily as needed for severe pain (pain score 7-10).   PACLITAXEL  IV Inject into the vein every 21 ( twenty-one) days.   pregabalin  (LYRICA ) 75 MG capsule Take 1 capsule (75 mg total) by mouth 3 (three) times daily.   sertraline  (ZOLOFT ) 100 MG tablet Take 1 tablet by  mouth once daily   [DISCONTINUED] RESTASIS 0.05 % ophthalmic emulsion SMARTSIG:1 Drop(s) In Eye(s) Every 12 Hours   Facility-Administered Encounter Medications as of 11/19/2023  Medication   diphenhydrAMINE  (BENADRYL ) 50 MG/ML injection   palonosetron  (ALOXI ) 0.25 MG/5ML injection    ALLERGIES:  Allergies  Allergen Reactions   Carboplatin  Other (See Comments)    body on fire and abdominal pain   Nickel Rash    itchy     FAMILY HISTORY:  Family History  Problem Relation Age of Onset   Hypertension Mother    Breast cancer Mother    Hypertension Father    Heart disease Father    Cancer Father        lung   Cancer Sister        ovarian cancer   Melanoma Sister    Colon cancer Neg Hx    Ovarian cancer Neg Hx    Endometrial cancer Neg Hx    Pancreatic cancer Neg Hx    Prostate cancer Neg Hx      SOCIAL HISTORY:  Social Connections:  Socially Isolated (07/03/2023)   Social Connection and Isolation Panel    Frequency of Communication with Friends and Family: Three times a week    Frequency of Social Gatherings with Friends and Family: Never    Attends Religious Services: Never    Database Administrator or Organizations: No    Attends Engineer, Structural: Not on file    Marital Status: Widowed    REVIEW OF SYSTEMS:  + Diarrhea, incontinence, frequency, joint pain, back pain, dizziness, headache, anxiety, depression, decreased concentration Denies appetite changes, fevers, chills, fatigue, unexplained weight changes. Denies hearing loss, neck lumps or masses, mouth sores, ringing in ears or voice changes. Denies cough or wheezing.  Denies shortness of breath. Denies chest pain or palpitations. Denies leg swelling. Denies abdominal distention, pain, blood in stools, constipation, nausea, vomiting, or early satiety. Denies pain with intercourse, dysuria, hematuria. Denies hot flashes, pelvic pain, vaginal bleeding or vaginal discharge.   Denies back pain or muscle pain/cramps. Denies itching, rash, or wounds. Denies numbness or seizures. Denies swollen lymph nodes or glands, denies easy bruising or bleeding. Denies confusion or decreased concentration.  Physical Exam:  Vital Signs for this encounter:  Blood pressure 124/60, pulse 60, temperature 98.4 F (36.9 C), temperature source Oral, resp. rate 19, height 5' 4 (1.626 m), weight 234 lb (106.1 kg), SpO2 96%. Body mass index is 40.17 kg/m. General: Alert, oriented, no acute distress.  HEENT: Normocephalic, atraumatic. Sclera anicteric.  Chest: Clear to auscultation bilaterally. No wheezes, rhonchi, or rales. Cardiovascular: Regular rate and rhythm, no murmurs, rubs, or gallops.  Abdomen: Obese. Normoactive bowel sounds. Soft, nondistended, nontender to palpation. No masses or hepatosplenomegaly appreciated. No palpable fluid wave.  Extremities: Grossly  normal range of motion. Warm, well perfused. No edema bilaterally.  Skin: No rashes or lesions.  Lymphatics: No cervical, supraclavicular, or inguinal adenopathy.  GU:  Normal external female genitalia. No lesions. No discharge or bleeding.             Bladder/urethra:  No lesions or masses, well supported bladder             Vagina: Minimal scarring at the apex, no visible lesions.             Cervix/uterus: surgically absent.             Adnexa: No masses appreciated.  Rectal: There are some mild thickening along the  right vaginal apex appreciated on rectovaginal exam, no firmness or nodularity.  LABORATORY AND RADIOLOGIC DATA:  Outside medical records were reviewed to synthesize the above history, along with the history and physical obtained during the visit.   Lab Results  Component Value Date   WBC 8.2 11/10/2023   HGB 13.2 11/10/2023   HCT 39.6 11/10/2023   PLT 322 11/10/2023   GLUCOSE 120 (H) 11/10/2023   CHOL 201 (H) 07/07/2023   TRIG 53 07/07/2023   HDL 92 07/07/2023   LDLCALC 99 07/07/2023   ALT 16 11/10/2023   AST 18 11/10/2023   NA 141 11/10/2023   K 4.0 11/10/2023   CL 104 11/10/2023   CREATININE 0.61 11/10/2023   BUN 15 11/10/2023   CO2 26 11/10/2023   TSH 4.930 (H) 07/07/2023   INR 1.0 05/18/2023   HGBA1C 6.2 (H) 07/07/2023

## 2023-11-19 ENCOUNTER — Encounter: Payer: Self-pay | Admitting: Gynecologic Oncology

## 2023-11-19 ENCOUNTER — Ambulatory Visit: Admitting: Internal Medicine

## 2023-11-19 ENCOUNTER — Inpatient Hospital Stay: Admitting: Gynecologic Oncology

## 2023-11-19 VITALS — BP 124/60 | HR 60 | Temp 98.4°F | Resp 19 | Ht 64.0 in | Wt 234.0 lb

## 2023-11-19 DIAGNOSIS — C775 Secondary and unspecified malignant neoplasm of intrapelvic lymph nodes: Secondary | ICD-10-CM

## 2023-11-19 DIAGNOSIS — Z8542 Personal history of malignant neoplasm of other parts of uterus: Secondary | ICD-10-CM

## 2023-11-19 DIAGNOSIS — R197 Diarrhea, unspecified: Secondary | ICD-10-CM | POA: Diagnosis not present

## 2023-11-19 DIAGNOSIS — C786 Secondary malignant neoplasm of retroperitoneum and peritoneum: Secondary | ICD-10-CM

## 2023-11-19 DIAGNOSIS — C549 Malignant neoplasm of corpus uteri, unspecified: Secondary | ICD-10-CM

## 2023-11-19 NOTE — Patient Instructions (Signed)
 It was very nice to meet you today.  Please let me know how your visit with the radiation oncologist goes tomorrow.  We discussed getting you set up for a PET scan to see if we can add any more information about what other the additional spots that we are seeing on your CT scan represent cancer.  This will be important as we decide how best to treat you next.  I also want unable to see the results of genetic testing that was reported to have been done on your cancer.  My office is going to reach out to your medical oncologist office.  If this has not been done, we will request it using the pathology from your hysterectomy.  I placed a referral for you to meet with one of our genetic counselors to get genetic testing done with a blood test.  This would look at whether you had any mutations that increase your risk of developing this type of cancer.

## 2023-11-20 ENCOUNTER — Encounter: Payer: Self-pay | Admitting: Oncology

## 2023-11-23 ENCOUNTER — Encounter: Payer: Self-pay | Admitting: Radiology

## 2023-12-03 ENCOUNTER — Encounter (HOSPITAL_COMMUNITY)
Admission: RE | Admit: 2023-12-03 | Discharge: 2023-12-03 | Disposition: A | Discharge status: Home / Self Care | Source: Ambulatory Visit | Attending: Gynecologic Oncology | Admitting: Gynecologic Oncology

## 2023-12-03 ENCOUNTER — Other Ambulatory Visit: Payer: Self-pay

## 2023-12-03 DIAGNOSIS — F321 Major depressive disorder, single episode, moderate: Secondary | ICD-10-CM

## 2023-12-03 DIAGNOSIS — F419 Anxiety disorder, unspecified: Secondary | ICD-10-CM

## 2023-12-03 DIAGNOSIS — C549 Malignant neoplasm of corpus uteri, unspecified: Secondary | ICD-10-CM | POA: Insufficient documentation

## 2023-12-03 MED ORDER — FLUDEOXYGLUCOSE F - 18 (FDG) INJECTION
12.6200 | Freq: Once | INTRAVENOUS | Status: AC | PRN
  Administered 2023-12-03: 12.62 via INTRAVENOUS

## 2023-12-04 ENCOUNTER — Ambulatory Visit: Payer: Self-pay | Admitting: Gynecologic Oncology

## 2023-12-11 ENCOUNTER — Telehealth: Payer: Self-pay | Admitting: Emergency Medicine

## 2023-12-11 NOTE — Telephone Encounter (Signed)
 Patient called in reference to what her new care plan will be now that she has had her visit and tests (biopsy) by refered physician. Patient has appointment with Dr Davonna on 12/3 to address new plan of care based off results from biopsy. Patient aware of appointment.

## 2023-12-22 ENCOUNTER — Ambulatory Visit
Admission: EM | Admit: 2023-12-22 | Discharge: 2023-12-22 | Disposition: A | Discharge status: Home / Self Care | Attending: Nurse Practitioner | Admitting: Nurse Practitioner

## 2023-12-22 ENCOUNTER — Encounter: Payer: Self-pay | Admitting: Emergency Medicine

## 2023-12-22 DIAGNOSIS — Z85028 Personal history of other malignant neoplasm of stomach: Secondary | ICD-10-CM | POA: Diagnosis not present

## 2023-12-22 DIAGNOSIS — R143 Flatulence: Secondary | ICD-10-CM | POA: Diagnosis present

## 2023-12-22 DIAGNOSIS — R1030 Lower abdominal pain, unspecified: Secondary | ICD-10-CM | POA: Insufficient documentation

## 2023-12-22 DIAGNOSIS — R195 Other fecal abnormalities: Secondary | ICD-10-CM | POA: Diagnosis not present

## 2023-12-22 DIAGNOSIS — R140 Abdominal distension (gaseous): Secondary | ICD-10-CM | POA: Diagnosis present

## 2023-12-22 MED ORDER — SIMETHICONE 80 MG PO CHEW: 80.0000 mg | CHEWABLE_TABLET | Freq: Four times a day (QID) | ORAL | 0 refills | Status: AC | PRN

## 2023-12-22 NOTE — Discharge Instructions (Addendum)
 Lab results are pending.  You will be contacted if the pending test results are abnormal.  You will also have access to the results via MyChart. Take medication as prescribed. Continue your current medications. Increase fluids. Recommend Pedialyte or Gatorade for dehydration. You may take Tylenol  as needed for pain, fever, or general discomfort. Yeah okay okay increase fluids and allow for plenty of rest. Recommend a bland diet while symptoms persist. Go to the emergency department if you develop worsening gas, bloating, abdominal pain, diarrhea, or new symptoms of fever, chills, or other concerns. Follow-up with oncology as scheduled. Follow-up as needed.

## 2023-12-22 NOTE — ED Triage Notes (Signed)
 States has stomach cancer.  Has appointment with cancer MD tomorrow.  States pop is green today and is having some pain with diarrhea.  States diarrhea is not new.  She is scared she may have e-coli

## 2023-12-22 NOTE — ED Provider Notes (Signed)
 [facility]    CSN: [id number] Arrival date & time: 12/22/23  1325      History   Chief Complaint No chief complaint on file.   HPI Jessica [name] is a 63 y.o. female.   The history is provided by the patient.   Patient presents for complaints of discolored stool, bloating, and lower abdominal pain.  Patient reports continued diarrhea, states that this is not new as she has a history of stomach cancer.  Patient also endorses continued flatulence.  Patient states that she is concerned that she may have E. coli.  Patient denies fever, chills, nausea, vomiting, bloody stools, melena stools, recent travel, recent change in medications, recent antibiotic use, or any new foods.  Patient states that she is scheduled to see her oncologist tomorrow.  So far, she has not taken any medications for her symptoms.  Past Medical History:  Diagnosis Date   Allergy    Anemia    Anxiety    Arthritis    Cancer (HCC)    [employer] 09/05/2019   Cancer of skin of back    Chest pain    Depression    Elevated blood pressure reading    Endometrial cancer (HCC)    Hyperlipidemia    Hypertension    PMB (postmenopausal bleeding)    Port-A-Cath in place 12/24/2018   Pre-diabetes     Patient Active Problem List   Diagnosis Date Noted   SK (seborrheic keratosis) 10/19/2023   Onychomycosis 10/19/2023   Abdominal pain 09/26/2023   Neoplasm related pain 09/26/2023   Nausea 09/26/2023   Diarrhea 09/26/2023   Obesity (BMI 30-39.9) 07/07/2023   Breast cancer screening by mammogram 07/07/2023   Need for Tdap vaccination 01/08/2023   BPPV (benign paroxysmal positional vertigo) 01/08/2023   Status post total replacement of right hip 09/19/2022   Cardiac chest pain 04/10/2022   Cholelithiasis 12/20/2021   Vitamin D  deficiency 06/18/2021   Prediabetes 06/18/2021   Annual physical exam 06/18/2021   Impaired vision in both eyes 06/18/2021   Breast tenderness in female 06/18/2021    Headache 02/11/2021   Morbid obesity (HCC) 01/28/2021   Insomnia 01/28/2021   SIRS (systemic inflammatory response syndrome) (HCC) 12/07/2020   Hypotension    Peripheral neuropathy due to chemotherapy 06/27/2020   Anemia due to antineoplastic chemotherapy 06/27/2020   Depression, major, single episode, moderate (HCC) 09/07/2019   Anxiety 09/07/2019   Port-A-Cath in place 12/24/2018   Goals of care, counseling/discussion 12/23/2018   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HCC) 12/14/2018   Elevated LFTs 05/05/2018   Hyperlipidemia 04/27/2018   Essential hypertension 04/26/2018   Endometrial cancer (HCC) 03/31/2018   H/O total hysterectomy 03/2018    Past Surgical History:  Procedure Laterality Date   IR IMAGING GUIDED PORT INSERTION  12/28/2018   IR RADIOLOGIST EVAL & MGMT  06/02/2018   IR RADIOLOGIST EVAL & MGMT  06/16/2018   NECK SURGERY  2000   spinal surgery , titanium rod in place    ROBOTIC ASSISTED TOTAL HYSTERECTOMY WITH BILATERAL SALPINGO OOPHERECTOMY N/A 04/13/2018   Procedure: XI ROBOTIC ASSISTED TOTAL HYSTERECTOMY WITH BILATERAL SALPINGO OOPHORECTOMY;  Surgeon: Eloy Herring, MD;  Location: [facility];  Service: Gynecology;  Laterality: N/A;   ROBOTIC PELVIC AND PARA-AORTIC LYMPH NODE DISSECTION N/A 04/13/2018   Procedure: XI ROBOTIC PELVIC LYMPHADECTOMY AND PARA-AORTIC LYMPH NODE DISSECTION;  Surgeon: Eloy Herring, MD;  Location: [facility];  Service: Gynecology;  Laterality: N/A;   TOTAL HIP ARTHROPLASTY Right 09/19/2022  Procedure: RIGHT TOTAL HIP ARTHROPLASTY ANTERIOR APPROACH;  Surgeon: Vernetta Lonni GRADE, MD;  Location: [facility];  Service: Orthopedics;  Laterality: Right;    OB History     Gravida  4   Para  3   Term  3   Preterm  0   AB  1   Living  3      SAB  0   IAB  1   Ectopic  0   Multiple  0   Live Births  3            Home Medications    Prior to Admission medications   Medication Sig Start Date End Date Taking?  Authorizing Provider  simethicone (MYLICON) 80 MG chewable tablet Chew 1 tablet (80 mg total) by mouth every 6 (six) hours as needed for flatulence. 12/22/23  Yes Leath-Warren, Etta PARAS, NP  amLODipine  (NORVASC ) 5 MG tablet Take 1 tablet by mouth once daily 09/25/23   [name], FNP  atorvastatin  (LIPITOR) 20 MG tablet Take 1 tablet by mouth once daily 09/25/23   [name], FNP  Cholecalciferol  (VITAMIN D3) 25 MCG (1000 UT) CAPS Take 1 capsule (1,000 Units total) by mouth daily. 06/18/21   Paseda, Folashade R, FNP  MAGnesium -Oxide 400 (240 Mg) MG tablet Take 1 tablet (400 mg total) by mouth 2 (two) times daily. 07/23/23   [name], MD  meclizine  (ANTIVERT ) 25 MG tablet Take 1 tablet (25 mg total) by mouth 3 (three) times daily as needed for dizziness. 01/08/23   Melvenia Manus BRAVO, MD  methocarbamol  (ROBAXIN ) 500 MG tablet Take 1 tablet (500 mg total) by mouth every 6 (six) hours as needed for muscle spasms. 09/20/22   Vernetta Lonni GRADE, MD  nitroGLYCERIN  (NITROSTAT ) 0.4 MG SL tablet Place 1 tablet (0.4 mg total) under the tongue every 5 (five) minutes as needed for chest pain. 04/10/22 11/11/23  Mallipeddi, Vishnu P, MD  oxyCODONE  (OXY IR/ROXICODONE ) 5 MG immediate release tablet Take 1 tablet (5 mg total) by mouth 2 (two) times daily as needed for severe pain (pain score 7-10). 09/07/23   Kandala, Hyndavi, MD  PACLITAXEL  IV Inject into the vein every 21 ( twenty-one) days. 12/29/18   [provider]  pregabalin  (LYRICA ) 75 MG capsule Take 1 capsule (75 mg total) by mouth 3 (three) times daily. 10/16/23   Geofm Delon BRAVO, NP  sertraline  (ZOLOFT ) 100 MG tablet Take 1 tablet by mouth once daily 12/03/23   [name], FNP    Family History Family History  Problem Relation Age of Onset   Hypertension Mother    Breast cancer Mother    Hypertension Father    Heart disease Father    Cancer Father        lung   Cancer Sister        ovarian cancer   Melanoma Sister     Colon cancer Neg Hx    Ovarian cancer Neg Hx    Endometrial cancer Neg Hx    Pancreatic cancer Neg Hx    Prostate cancer Neg Hx     Social History Social History   Tobacco Use   Smoking status: Never   Smokeless tobacco: Never  Vaping Use   Vaping status: Never Used  Substance Use Topics   Alcohol use: Yes    Alcohol/week: 4.0 standard drinks of alcohol    Types: 4 Glasses of wine per week    Comment: weekends   Drug use: Never  Allergies   Carboplatin  and Nickel   Review of Systems Review of Systems Per HPI  Physical Exam Triage Vital Signs ED Triage Vitals  Encounter Vitals Group     BP 12/22/23 1405 130/80     Girls Systolic BP Percentile --      Girls Diastolic BP Percentile --      Boys Systolic BP Percentile --      Boys Diastolic BP Percentile --      Pulse Rate 12/22/23 1405 76     Resp 12/22/23 1405 18     Temp 12/22/23 1405 98.2 F (36.8 C)     Temp Source 12/22/23 1405 Oral     SpO2 12/22/23 1405 94 %     Weight --      Height --      Head Circumference --      Peak Flow --      Pain Score 12/22/23 1406 1     Pain Loc --      Pain Education --      Exclude from Growth Chart --    No data found.  Updated Vital Signs BP 130/80 (BP Location: Right Arm)   Pulse 76   Temp 98.2 F (36.8 C) (Oral)   Resp 18   SpO2 94%   Visual Acuity Right Eye Distance:   Left Eye Distance:   Bilateral Distance:    Right Eye Near:   Left Eye Near:    Bilateral Near:     Physical Exam Vitals and nursing note reviewed.  Constitutional:      General: She is not in acute distress.    Appearance: Normal appearance.  HENT:     Head: Normocephalic.  Eyes:     Extraocular Movements: Extraocular movements intact.     Conjunctiva/sclera: Conjunctivae normal.     Pupils: Pupils are equal, round, and reactive to light.  Cardiovascular:     Rate and Rhythm: Normal rate and regular rhythm.     Pulses: Normal pulses.     Heart sounds: Normal heart  sounds.  Pulmonary:     Effort: Pulmonary effort is normal. No respiratory distress.     Breath sounds: Normal breath sounds. No stridor. No wheezing, rhonchi or rales.  Abdominal:     General: Bowel sounds are normal.     Palpations: Abdomen is soft.     Tenderness: There is no abdominal tenderness. There is no guarding or rebound.  Musculoskeletal:     Cervical back: Normal range of motion.  Skin:    General: Skin is warm and dry.  Neurological:     General: No focal deficit present.     Mental Status: She is alert and oriented to person, place, and time.  Psychiatric:        Mood and Affect: Mood normal.        Behavior: Behavior normal.      UC Treatments / Results  Labs (all labs ordered are listed, but only abnormal results are displayed) Labs Reviewed  GASTROINTESTINAL PANEL BY PCR, STOOL (REPLACES STOOL CULTURE)  CBC WITH DIFFERENTIAL/PLATELET    EKG   Radiology No results found.  Procedures Procedures (including critical care time)  Medications Ordered in UC Medications - No data to display  Initial Impression / Assessment and Plan / UC Course  I have reviewed the triage vital signs and the nursing notes.  Pertinent labs & imaging results that were available during my care of the patient were reviewed by  me and considered in my medical decision making (see chart for details).  Patient presents for complaints of stool discoloration.  States that she had episodes of green stool earlier this morning.  She states that she has had diarrhea, but this is not new given her history of stomach cancer.  Patient states she is concerned that she may have a E. coli.  On exam, lung sounds are clear throughout, room air sats are at 94%.  She does not exhibit any abdominal tenderness at this time.  No concern for acute abdomen.  GI panel ordered along with CBC.  Will treat patient's gas and bloating with simethicone  80 mg.  Supportive care recommendations were provided and  discussed with the patient to include fluids, rest, over-the-counter Tylenol , and to continue her current medications.  Patient was given indications to follow-up in the emergency department.  Patient advised to follow-up with oncology as scheduled.  Patient was in agreement with this plan of care and verbalizes understanding.  All questions were answered.  Patient stable for discharge.   Final Clinical Impressions(s) / UC Diagnoses   Final diagnoses:  Stool discoloration  Abdominal bloating  Flatulence  History of stomach cancer     Discharge Instructions      Lab results are pending.  You will be contacted if the pending test results are abnormal.  You will also have access to the results via MyChart. Take medication as prescribed. Continue your current medications. Increase fluids. Recommend Pedialyte or Gatorade for dehydration. You may take Tylenol  as needed for pain, fever, or general discomfort. Yeah okay okay increase fluids and allow for plenty of rest. Recommend a bland diet while symptoms persist. Go to the emergency department if you develop worsening gas, bloating, abdominal pain, diarrhea, or new symptoms of fever, chills, or other concerns. Follow-up with oncology as scheduled. Follow-up as needed.     ED Prescriptions     Medication Sig Dispense Auth. Provider   simethicone  (MYLICON) 80 MG chewable tablet Chew 1 tablet (80 mg total) by mouth every 6 (six) hours as needed for flatulence. 30 tablet Leath-Warren, Etta PARAS, NP      PDMP not reviewed this encounter.   Gilmer Etta PARAS, NP 12/22/23 1446

## 2023-12-23 ENCOUNTER — Inpatient Hospital Stay: Attending: Hematology | Admitting: Oncology

## 2023-12-23 ENCOUNTER — Telehealth: Payer: Self-pay | Admitting: Pharmacy Technician

## 2023-12-23 ENCOUNTER — Ambulatory Visit (HOSPITAL_COMMUNITY): Payer: Self-pay

## 2023-12-23 ENCOUNTER — Other Ambulatory Visit (HOSPITAL_COMMUNITY): Payer: Self-pay

## 2023-12-23 VITALS — BP 133/85 | HR 67 | Temp 98.1°F | Resp 16 | Wt 230.8 lb

## 2023-12-23 DIAGNOSIS — R197 Diarrhea, unspecified: Secondary | ICD-10-CM

## 2023-12-23 DIAGNOSIS — T451X5A Adverse effect of antineoplastic and immunosuppressive drugs, initial encounter: Secondary | ICD-10-CM

## 2023-12-23 DIAGNOSIS — C786 Secondary malignant neoplasm of retroperitoneum and peritoneum: Secondary | ICD-10-CM | POA: Diagnosis present

## 2023-12-23 DIAGNOSIS — Z5112 Encounter for antineoplastic immunotherapy: Secondary | ICD-10-CM | POA: Insufficient documentation

## 2023-12-23 DIAGNOSIS — Z8542 Personal history of malignant neoplasm of other parts of uterus: Secondary | ICD-10-CM | POA: Diagnosis not present

## 2023-12-23 DIAGNOSIS — Z95828 Presence of other vascular implants and grafts: Secondary | ICD-10-CM | POA: Diagnosis not present

## 2023-12-23 DIAGNOSIS — G629 Polyneuropathy, unspecified: Secondary | ICD-10-CM | POA: Insufficient documentation

## 2023-12-23 DIAGNOSIS — G620 Drug-induced polyneuropathy: Secondary | ICD-10-CM

## 2023-12-23 DIAGNOSIS — Z79899 Other long term (current) drug therapy: Secondary | ICD-10-CM | POA: Diagnosis not present

## 2023-12-23 DIAGNOSIS — C775 Secondary and unspecified malignant neoplasm of intrapelvic lymph nodes: Secondary | ICD-10-CM | POA: Diagnosis present

## 2023-12-23 DIAGNOSIS — C541 Malignant neoplasm of endometrium: Secondary | ICD-10-CM

## 2023-12-23 LAB — CBC WITH DIFFERENTIAL/PLATELET
Basophils Absolute: 0.1 x10E3/uL (ref 0.0–0.2)
Basos: 1 %
EOS (ABSOLUTE): 0.1 x10E3/uL (ref 0.0–0.4)
Eos: 2 %
Hematocrit: 44 % (ref 34.0–46.6)
Hemoglobin: 14.2 g/dL (ref 11.1–15.9)
Immature Grans (Abs): 0 x10E3/uL (ref 0.0–0.1)
Immature Granulocytes: 0 %
Lymphocytes Absolute: 2.5 x10E3/uL (ref 0.7–3.1)
Lymphs: 28 %
MCH: 28.2 pg (ref 26.6–33.0)
MCHC: 32.3 g/dL (ref 31.5–35.7)
MCV: 88 fL (ref 79–97)
Monocytes Absolute: 0.5 x10E3/uL (ref 0.1–0.9)
Monocytes: 5 %
Neutrophils Absolute: 5.9 x10E3/uL (ref 1.4–7.0)
Neutrophils: 64 %
Platelets: 306 x10E3/uL (ref 150–450)
RBC: 5.03 x10E6/uL (ref 3.77–5.28)
RDW: 13.3 % (ref 11.7–15.4)
WBC: 9.1 x10E3/uL (ref 3.4–10.8)

## 2023-12-23 LAB — GASTROINTESTINAL PANEL BY PCR, STOOL (REPLACES STOOL CULTURE)

## 2023-12-23 MED ORDER — LENVATINIB (14 MG DAILY DOSE) 10 & 4 MG PO CPPK
14.0000 mg | ORAL_CAPSULE | Freq: Every day | ORAL | 2 refills | Status: DC
  Filled 2023-12-25: qty 60, 30d supply, fill #0
  Filled 2024-01-19 – 2024-01-22 (×2): qty 60, 30d supply, fill #1

## 2023-12-23 NOTE — Patient Instructions (Signed)
 [facility] at [facility] Discharge Instructions   You were seen and examined today by Dr. Davonna.  She reviewed the results of your lab work which are normal/stable.   We will proceed with your treatment next week. Treatment with be with an immunotherapy drug called Keytruda. This is an infusion that is given in the clinic every 3 weeks. We will also start you on a pill called Lenvima. This medication will be delivered to your home and is taken daily.    Return as scheduled.    Thank you for choosing [facility] at [facility] to provide your oncology and hematology care.  To afford each patient quality time with our provider, please arrive at least 15 minutes before your scheduled appointment time.   If you have a lab appointment with the Cancer Center please come in thru the Main Entrance and check in at the main information desk.  You need to re-schedule your appointment should you arrive 10 or more minutes late.  We strive to give you quality time with our providers, and arriving late affects you and other patients whose appointments are after yours.  Also, if you no show three or more times for appointments you may be dismissed from the clinic at the providers discretion.     Again, thank you for choosing [facility].  Our hope is that these requests will decrease the amount of time that you wait before being seen by our physicians.       _____________________________________________________________  Should you have questions after your visit to [facility], please contact our office at [phone] and follow the prompts.  Our office hours are 8:00 a.m. and 4:30 p.m. Monday - Friday.  Please note that voicemails left after 4:00 p.m. may not be returned until the following business day.  We are closed weekends and major holidays.  You do have access to a nurse 24-7, just call the main number to the clinic  [phone] and do not press any options, hold on the line and a nurse will answer the phone.    For prescription refill requests, have your pharmacy contact our office and allow 72 hours.    Due to Covid, you will need to wear a mask upon entering the hospital. If you do not have a mask, a mask will be given to you at the Main Entrance upon arrival. For doctor visits, patients may have 1 support person age 96 or older with them. For treatment visits, patients can not have anyone with them due to social distancing guidelines and our immunocompromised population.

## 2023-12-23 NOTE — Telephone Encounter (Signed)
 Oral Oncology Patient Advocate Encounter  New authorization  After completing a benefits investigation, prior authorization for Lenvima is not required at this time through [facility] part D.  Patient's copay is $0.     Jessica [name] (Patty) Chet Burnet, CPhT  [facility] - [facility], Zelda Salmon, [facility] - Oral Chemotherapy Patient Advocate Specialist III Phone: [phone]  Fax: [phone]

## 2023-12-23 NOTE — Progress Notes (Signed)
 Patient Care Team: [name], FNP as PCP - General (Family Medicine) Mallipeddi, [name], MD as PCP - Cardiology (Cardiology) Wilson, [name], RN as Oncology Nurse Navigator  Clinic Day:  09/07/2023  Referring physician: [name], FNP   CHIEF COMPLAINT:  CC: Recurrent endometrial carcinosarcoma    ASSESSMENT & PLAN:   Assessment & Plan: Jessica [name]  is a 63 y.o. 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  There is hypermetabolic tumor deposit between the sigmoid colon and left lateral bladder roof has enlarged , bladder wall invasion and/or serosal involvement of adjacent sigmoid colon are not excluded. -Considering the extent of the tumor at its proximity to GI tract, after discussion with Dr. Viktoria and Dr. Dannielle, decided to proceed with systemic therapy. - Discussed treatment with lenvatinib and pembrolizumab.  Risk versus benefits discussed in detail.Lenvima commonly causes hypertension, diarrhea, fatigue, weight loss, and hypothyroidism, along with risks of proteinuria and hand-foot syndrome. Pembrolizumab adds the potential for immune-related adverse events, including pneumonitis, colitis, hepatitis, endocrinopathies (thyroiditis, adrenal insufficiency), and skin reactions. -Will schedule to start next week.  Will start with lenvatinib 14 mg daily. - Labs reviewed today: CBC: WNL -Physical exam stable today.  - [id number] showed high TMB score. - Continue with genetic counseling. - Will repeat PET scan in 3 months.   Return to clinic prior to second cycle of  pembrolizumab to assess for tolerance.  Peripheral neuropathy Peripheral neuropathy likely related to chemotherapy, with symptoms of numbness and tingling in the toes.  - Continue Lyrica  75 mg twice daily for peripheral neuropathy.   Chronic diarrhea Recent green diarrhea episode, stool sample pending.   - Refer to gastroenterologist for evaluation. - Advise Imodium after each loose bowel movement, up to five times daily. - Monitor for infection signs or complications.    The patient understands the plans discussed today and is in agreement with them.  She knows to contact our office if she develops concerns prior to her next appointment.  The total time spent in the appointment was 30 minutes for the encounter with patient, including review of chart and various tests results, discussions about plan of care and coordination of care plan  Mickiel Dry, MD  [facility] [place] - A DEPT OF [name] [address] [place] [address] Dept: [phone] Dept Fax: [phone]   No orders of the defined types were placed in this encounter.    ONCOLOGY HISTORY:   I have reviewed her chart and materials related to her cancer extensively and collaborated history with the patient. Summary of oncologic history is as follows:   Diagnosis: Recurrent endometrial carcinosarcoma   -03/29/2018: Cervical biopsy at 5 o'clock, 7 o'clock, and 12 o'clock.  -Pathology: Carcinosarcoma (malignant mixed mullerian tumor). Much of the carcinomatous component has clear cell features. -04/13/2018: TAH, BSO, bilateral pelvic and para-aortic lymph node resections.  Pathology: High-grade carcinosarcoma (malignant mixed mullerian tumor) arising in an endometrial polyp with no myometrial or serosal invasion identified. 0/39 lymph nodes positive. pT1a pN0, FIGO stage Ia. MMR preserved. MSI-stable.  -12/09/2018 CT AP: Fairly extensive aggressive and  new abdominal/pelvic carcinomatosis consistent with  recurrent endometrial cancer. Associated small volume abdominal/pelvic ascites. No findings for bowel involvement or bowel obstruction. -12/22/2018 CA 125 elevated at 127.0 -12/28/2018 Omental mass biopsy. Pathology: Poorly differentiated carcinoma.  -Comment: Immunohistochemistry is positive for PAX 8 and ER (weak). CK7, CK20,  TTF-1, CDX-2 and GATA-3 are negative. -12/29/2018-10/05/2019: Carboplatin  and paclitaxel . Patient developed severe allergic reaction during cycle 14 with carboplatin  and was discontinued -05/02/2019: CT CAP:  Interval response to therapy as evidenced by decrease in size of peritoneal metastases. -07/04/2019: CT AP: Continued positive response to therapy. Scattered peritoneal metastases in the pelvis and left omentum are decreased. No new or progressive metastatic disease in the abdomen or pelvis. -10/24/2019: CT AP: Substantial reduction in size of soft tissue nodules in the pelvis compatible with positive response to therapy. Faint stranding in the left omentum at site of prior nodularity. -11/09/2019-08/22/2021: Single agent paclitaxel  started,01/18/2020 paclitaxel  dose reduced by 20%,  treatment held on 08/22/2021 for chemo holiday -01/10/2020-09/30/2022: CT AP: Stable disease - 04/06/2023:CT AP: New defined 2.2 cm soft tissue density along the superior and right lateral margin of the vaginal cuff, suspicious for local recurrence. Increased size of 11 mm lymph node or peritoneal nodule adjacent to the left external iliac vessels, consistent with metastatic disease. Stable 3 mm nodule or lymph node in sigmoid mesocolon. -04/20/2023: [id number]: MPL [id number]. No other targetable mutations.  -05/18/2023: Left external iliac lymph node biopsy.  -Pathology: Poorly differentiated malignant neoplasm with dual cell population and areas of squamous differentiation.  -Comment: The epithelioid component shows squamous differentiation and shows  labeling for cytokeratin AE1/AE3, p63 and CK5/6.  The more spindled component shows focal labeling for p63 but is negative for cytokeratin AE1/AE3 and CK5/6.  Immunostain for PAX8 shows only weak nonspecific labeling in both components.  -05/18/2023: [id number]: Not enough tissue. HER2 by IHC (1+).  -08/11/2023: CT AP: Increased size of the left external iliac lymph node now measuring 16 mm in short axis previously 11 mm, concerning for progressive nodal disease. Similar soft tissue nodularity along the right vaginal cuff measuring 2.1 cm previously 2.2 cm. -08/18/2023- 10/20/2023: Paclitaxel  140 mg/m every 3 weeks (restarted).  Discontinued for progression. - 09/02/2023: CT CAP: Continued interval enlargement of a left iliac lymph node or soft tissue nodule measuring 3.1 x 3.0 cm, previously 1.8 x 1.6 cm, consistent with worsened metastatic disease. Previously described soft tissue at the right aspect of the vaginal cuff is obscured on today's examination by dense metallic streak artifact from adjacent arthroplasty. New, very tiny nodules in the left lung base measuring up to 0.2 cm, nonspecific and most likely infectious or inflammatory but warranting close attention on follow-up in the setting of established metastatic disease.   Current Treatment:  Paclitaxel  140 mg/m every 3 weeks  INTERVAL HISTORY:   Discussed the use of AI scribe software for clinical note transcription with the patient, who gave verbal consent to proceed.  History of Present Illness Ayven Glasco is a 63 year old female who presents for  follow up and discussion of  further treatment options.  She experiences ongoing diarrhea, described as 'green, green, green.' She visited urgent care due to concerns about a possible infection, and a stool sample was taken. She also reports bloating and a 'funny' feeling in her stomach.  She has a history of severe leg pain associated with previous chemotherapy. Currently, she  experiences numbness and a swollen feeling in her right foot, particularly in the toes. This numbness is persistent but not painful.  She reports a sensation of pressure in her stomach when taking deep breaths, although it is not painful.   I have reviewed the past medical history, past surgical history, social history and family history with the patient and they are unchanged from previous note.  ALLERGIES:  is allergic to carboplatin  and nickel.  MEDICATIONS:  Current Outpatient Medications  Medication Sig Dispense Refill   amLODipine  (NORVASC ) 5 MG tablet Take 1 tablet by mouth once daily 90 tablet 0   atorvastatin  (LIPITOR) 20 MG tablet Take 1 tablet by mouth once daily 90 tablet 0   Cholecalciferol  (VITAMIN D3) 25 MCG (1000 UT) CAPS Take 1 capsule (1,000 Units total) by mouth daily. 60 capsule 2   MAGnesium -Oxide 400 (240 Mg) MG tablet Take 1 tablet (400 mg total) by mouth 2 (two) times daily. 60 tablet 3   meclizine  (ANTIVERT ) 25 MG tablet Take 1 tablet (25 mg total) by mouth 3 (three) times daily as needed for dizziness. 30 tablet 3   methocarbamol  (ROBAXIN ) 500 MG tablet Take 1 tablet (500 mg total) by mouth every 6 (six) hours as needed for muscle spasms. 40 tablet 1   nitroGLYCERIN  (NITROSTAT ) 0.4 MG SL tablet Place 1 tablet (0.4 mg total) under the tongue every 5 (five) minutes as needed for chest pain. 25 tablet 3   oxyCODONE  (OXY IR/ROXICODONE ) 5 MG immediate release tablet Take 1 tablet (5 mg total) by mouth 2 (two) times daily as needed for severe pain (pain score 7-10). 60 tablet 0   PACLITAXEL  IV Inject into the vein every 21 ( twenty-one) days.     pregabalin  (LYRICA ) 75 MG capsule Take 1 capsule (75 mg total) by mouth 3 (three) times daily. 90 capsule 3   sertraline  (ZOLOFT ) 100 MG tablet Take 1 tablet by mouth once daily 30 tablet 0   simethicone  (MYLICON) 80 MG chewable tablet Chew 1 tablet (80 mg total) by mouth every 6 (six) hours as needed for flatulence. 30 tablet 0    No current facility-administered medications for this visit.   Facility-Administered Medications Ordered in Other Visits  Medication Dose Route Frequency Provider Last Rate Last Admin   diphenhydrAMINE  (BENADRYL ) 50 MG/ML injection            palonosetron  (ALOXI ) 0.25 MG/5ML injection             VITALS:  There were no vitals taken for this visit.  Wt Readings from Last 3 Encounters:  11/19/23 234 lb (106.1 kg)  11/10/23 232 lb 3.2 oz (105.3 kg)  10/20/23 230 lb 9.6 oz (104.6 kg)    There is no height or weight on file to calculate BMI.  Performance status (ECOG): 2 - Symptomatic, <50% confined to bed  PHYSICAL EXAM:   GENERAL:alert, no distress and comfortable SKIN: skin color, texture, turgor are normal, no rashes or significant lesions LYMPH:  no palpable lymphadenopathy in the cervical, axillary or inguinal LUNGS: clear to auscultation and percussion with normal breathing effort HEART: regular rate & rhythm and no murmurs and no lower extremity edema ABDOMEN:abdomen soft, non-tender and normal bowel sounds Musculoskeletal:no cyanosis of digits and no clubbing  NEURO: alert & oriented x 3 with fluent speech  LABORATORY DATA:  I have reviewed the data as listed    Component Value Date/Time   NA 141 11/10/2023 0843   NA 145 (H) 07/07/2023 1027   K 4.0 11/10/2023 0843   CL 104 11/10/2023 0843   CO2 26 11/10/2023 0843   GLUCOSE 120 (H)  11/10/2023 0843   BUN 15 11/10/2023 0843   BUN 14 07/07/2023 1027   CREATININE 0.61 11/10/2023 0843   CALCIUM  9.2 11/10/2023 0843   PROT 6.9 11/10/2023 0843   PROT 7.0 07/07/2023 1027   ALBUMIN 4.3 11/10/2023 0843   ALBUMIN 4.4 07/07/2023 1027   AST 18 11/10/2023 0843   ALT 16 11/10/2023 0843   ALKPHOS 115 11/10/2023 0843   BILITOT 0.3 11/10/2023 0843   BILITOT 0.3 07/07/2023 1027   GFRNONAA >60 11/10/2023 0843   GFRAA >60 10/05/2019 0825    Lab Results  Component Value Date   WBC 8.2 11/10/2023   NEUTROABS 4.7 11/10/2023    HGB 13.2 11/10/2023   HCT 39.6 11/10/2023   MCV 86.8 11/10/2023   PLT 322 11/10/2023     RADIOGRAPHIC STUDIES: I have personally reviewed the radiological images as listed and agreed with the findings in the report.  NM PET Image Initial (PI) Skull Base To Thigh (F-18 FDG) EXAM: PET AND CT SKULL BASE TO MID THIGH 12/03/2023 11:03:13 AM  TECHNIQUE:  RADIOPHARMACEUTICAL: 12.62 mCi F-18 FDG Uptake time 60 minutes. Glucose level 115 mg/dl. Blood pool SUV 2.3.  PET imaging was acquired from the base of the skull to the mid thighs. Non-contrast enhanced computed tomography was obtained for attenuation correction and anatomic localization.  COMPARISON: CT scan 11/02/2023.  CLINICAL HISTORY: Restaging uterine carcinosarcoma.  FINDINGS:  HEAD AND NECK: Graph lower cervical plate and screw fixator, the right C7 screw is fractured with the fragment containing the head of the screw backed out about 7 mm. No metabolically active cervical lymphadenopathy.  CHEST: Right port a cath tip colon cavoatrial junction. Mild atheromatous vascular calcification of the thoracic aorta, left anterior descending, and right coronary arteries. Small to moderate hiatal hernia. Mild cardiomegaly. Mildly asymmetric glandular tissue laterally in the right breast but without hypermetabolic activity, likely incidental. No metabolically active pulmonary nodules. No metabolically active lymphadenopathy.  ABDOMEN AND PELVIS: Hypermetabolic tumor deposit interposed between the sigmoid colon and the left lateral roof of the otherwise empty urinary bladder measures 3.4 x 3.3 cm on image 134 series 202 (formally 2.9 x 2.8 cm when measured in a similar manner on 11/02/2023) and demonstrates central necrosis and peripheral high signal intensity. Maximum SUV 12.7. Bladder wall invasion not excluded. Serosal involvement of the adjacent sigmoid colon not excluded. No current accentuated metabolic activity  is identifiable on the vaginal cuff. Suspected gallstones. Abdominal aortic atherosclerosis. No metabolically active lymphadenopathy. Physiologic activity within the gastrointestinal and genitourinary systems.  BONES AND SOFT TISSUE: No abnormal FDG activity localizes to the bones. No metabolically active aggressive osseous lesion.  IMPRESSION: 1. Hypermetabolic tumor deposit between the sigmoid colon and left lateral bladder roof has enlarged to 3.4 x 3.3 cm with central necrosis and SUV max 12.7; bladder wall invasion and/or serosal involvement of adjacent sigmoid colon are not excluded. 2. No current accentuated metabolic activity at the vaginal cuff. 3. Mild cardiomegaly. 4. Mild atheromatous calcifications of the thoracic aorta and coronary arteries.  Electronically signed by: Ryan Salvage MD 12/04/2023 03:50 PM EST RP Workstation: [id number]

## 2023-12-23 NOTE — Progress Notes (Signed)
 DISCONTINUE ON PATHWAY REGIMEN - Uterine     A cycle is every 21 days:     Paclitaxel       Carboplatin    **Always confirm dose/schedule in your pharmacy ordering system**  PRIOR TREATMENT: [id number]: Referral to Radiation Followed by Carboplatin  AUC=6 + Paclitaxel  175 mg/m2 q21 Days x 6 Cycles  START ON PATHWAY REGIMEN - Uterine     A cycle is every 21 days:     Lenvatinib      Pembrolizumab   **Always confirm dose/schedule in your pharmacy ordering system**  Patient Characteristics: Carcinosarcoma, Recurrent/Progressive Disease, Second Line, Relapse < 12 Months From Prior Therapy, HER2 Negative/Unknown Histology: Carcinosarcoma Therapeutic Status: Recurrent or Progressive Disease Line of Therapy: Second Line Time to Recurrence: Relapse < 12 Months From Prior Therapy HER2 Expression Status by IHC: Negative (IHC 0, 1+) Intent of Therapy: Non-Curative / Palliative Intent, Discussed with Patient

## 2023-12-24 ENCOUNTER — Telehealth: Payer: Self-pay | Admitting: Pharmacist

## 2023-12-24 NOTE — Telephone Encounter (Signed)
 Clinical Pharmacist Practitioner Encounter   Received new prescription for Lenvima (lenvatinib) for the treatment of recurrent endometrial carcinoma in conjunction with pembrolizumab , planned duration until disease progression or unacceptable toxicity.  Plan start 12/31/2023.  CMP from 11/10/2023 assessed, no relevant lab abnormalities. Prescription dose and frequency assessed.   Current medication list in Epic reviewed, FU DDIs with lenvatinib identified: Sertraline  and meclizine : QTc prolonging agents such as, Sertraline  and meclizine , may increase the QTc prolongation effect of lenvatinib.  Consider monitoring for QTc prolongation when used in combination.  No baseline dose adjustment needed.   Evaluated chart and no patient barriers to medication adherence identified.   Prescription has been e-scribed to the [facility] for benefits analysis and approval.  Oral Oncology Clinic will continue to follow for insurance authorization, copayment issues, initial counseling and start date.   Raea Magallon N. Nayleah Gamel, PharmD, BCOP, CPP Hematology/Oncology Clinical Pharmacist [facility] Oral Chemotherapy Navigation Clinic [phone]  12/24/2023 4:11 PM

## 2023-12-25 ENCOUNTER — Other Ambulatory Visit: Payer: Self-pay

## 2023-12-25 ENCOUNTER — Other Ambulatory Visit: Payer: Self-pay | Admitting: Pharmacy Technician

## 2023-12-25 ENCOUNTER — Telehealth: Payer: Self-pay | Admitting: Pharmacy Technician

## 2023-12-25 ENCOUNTER — Other Ambulatory Visit (HOSPITAL_COMMUNITY): Payer: Self-pay

## 2023-12-25 ENCOUNTER — Telehealth: Payer: Self-pay | Admitting: Pharmacist

## 2023-12-25 NOTE — Progress Notes (Signed)
 Specialty Pharmacy Initial Fill Coordination Note  Jessica [name] is a 63 y.o. female contacted today regarding initial fill of specialty medication(s) Lenvatinib  Mesylate (LENVIMA )   Patient requested Delivery   Delivery date: 12/30/23   Verified address: [address] [place] [address]   Medication will be filled on: 12/29/23   Patient is aware of $0 copayment.   Nikira Kushnir (Patty) Chet Burnet, CPhT  [facility], [facility] [place], [facility] - Oral Chemotherapy Patient Advocate Specialist III Phone: [phone]  Fax: [phone]

## 2023-12-25 NOTE — Progress Notes (Addendum)
 Clinical Pharmacist Practitioner Encounter   [facility] (Specialty) will deliver medication to patient on 12/30/23.  Patient knows not to start until 12/31/23.    Patient Education I spoke with patient for overview of new oral chemotherapy medication: Lenvima  (lenvatinib ) for the treatment of recurrent endometrial carcinoma in conjunction with pembrolizumab, planned duration until disease progression or unacceptable toxicity.  Plan start 12/31/2023.   Treatment goal: Palliative  Counseled patient on administration, dosing, side effects, monitoring, drug-food interactions, safe handling, storage, and disposal. Patient will take Lenvima  (lenvatinib ) for the treatment of recurrent endometrial carcinoma in conjunction with pembrolizumab , planned duration until disease progression or unacceptable toxicity.  Plan start 12/31/2023.   Side effects include but not limited to: diarrhea, hypertension, fatigue, hand-foot syndrome, mouth sores.   Diarrhea: discussed with patient that depending on which medication is causing the diarrhea, that determines the best management for the diarrhea. They should call the office if diarrhea occurs, they can start loperamide but still needs to call the office. Hypertension: reviewed s/sx of hypertension, patient reports not having a blood pressure cuff at home Mouth sores: Patient knows to request magic mouthwash if needed.   Hand-foot syndrome: Recommended the use of [place] Smooth Extra Care 20. Patient knows to report any skin changes they notice.    Reviewed with patient importance of keeping a medication schedule and plan for any missed doses.  After discussion with patient no patient barriers to medication adherence identified.   Distress evaluation: Distress thermometer not completed during telephone call as patient has been on previous lines of therapy.   Communication and Learning Assessment Primary learner: patient Barriers to learning: No  barriers Preferred language: English Learning preferences: Listening Reading  Ms. Kohl voiced understanding and appreciation. All questions answered. Medication handout provided.  Provided patient with Oral Chemotherapy Navigation Clinic phone number. Patient knows to call the office with questions or concerns. Reviewed with patient the expectations for rescheduling or cancelling appointments.  Oral Chemotherapy Navigation Clinic will continue to follow.  Lizzette Carbonell N. Soniyah Mcglory, PharmD, BCOP, CPP Hematology/Oncology Clinical Pharmacist [facility] Oral Chemotherapy Navigation Clinic [phone]  12/25/2023 10:43 AM

## 2023-12-25 NOTE — Patient Instructions (Signed)
 [facility] - A Dept Of [place]. [facility]  Thank you for choosing [facility] to provide your oncology/hematology care and for allowing us  to participate in your care today!  As a reminder, we spoke about the following today: Lenvima  (lenvatinib ) for the treatment of recurrent endometrial carcinoma in conjunction with pembrolizumab, planned duration until disease progression or unacceptable toxicity.  Plan start 12/31/2023.   Treatment goal: Palliative  Medication handout has been provided.   **For oral cancer medication prescription refill requests, contact your pharmacy and they will contact our office if needed. Allow 5-7 days for refills to be completed by your specialty pharmacy.    Cancer Center General Instructions:  If you have an appointment at the [facility], please go directly to the Cancer Center and check in at the registration area.  We strive to give you quality time with your provider. You may need to reschedule your appointment if you arrive late (15 or more minutes).  Arriving late affects you and other patients whose appointments are after yours.  Also, if you miss three or more appointments without notifying the office, you may be dismissed from the clinic at the provider's discretion.      BELOW ARE SYMPTOMS THAT SHOULD BE REPORTED IMMEDIATELY: *FEVER GREATER THAN 100.4 F (38 C) OR HIGHER *CHILLS OR SWEATING *NAUSEA AND VOMITING THAT IS NOT CONTROLLED WITH YOUR NAUSEA MEDICATION *UNUSUAL SHORTNESS OF BREATH *UNUSUAL BRUISING OR BLEEDING *URINARY PROBLEMS (pain or burning when urinating, or frequent urination) *BOWEL PROBLEMS (unusual diarrhea, constipation, pain near the anus) TENDERNESS IN MOUTH AND THROAT WITH OR WITHOUT PRESENCE OF ULCERS (sore throat, sores in mouth, or a toothache) UNUSUAL RASH, SWELLING OR PAIN  UNUSUAL VAGINAL DISCHARGE OR ITCHING   Items with * indicate a potential emergency and should be  followed up as soon as possible or go to the Emergency Department if any problems should occur.  Should you have questions after your visit or need to cancel or reschedule your appointment, please contact [facility] - A Dept Of [place]  [phone] and follow the prompts.  Office hours are 8:00 a.m. to 4:30 p.m. Monday - Friday. Please note that voicemails left after 4:00 p.m. may not be returned until the following business day.  We are closed weekends and major holidays. You have access to a nurse at all times for urgent questions. Please call the main number to the clinic [phone] and follow the prompts.  For any non-urgent questions, you may also contact your provider using MyChart. We now offer e-Visits for anyone 71 and older to request care online for non-urgent symptoms. For details visit [email or web address].   Also download the MyChart app! Go to the app store, search MyChart, open the app, select [place], and log in with your MyChart username and password.

## 2023-12-25 NOTE — Progress Notes (Signed)
 Patient education documented in EPIC note on 12/25/23.

## 2023-12-25 NOTE — Telephone Encounter (Signed)
 Oral Oncology Patient Advocate Encounter  Patient successfully OnBoarded and drug education provided by pharmacist. Medication scheduled to be shipped on 12/09 for delivery on 12/10 from [facility] to patient's address. Patient also knows to call me at [phone] with any questions or concerns regarding receiving medication or if there is any unexpected change in co-pay.    Temperence Zenor (Patty) Chet Burnet, CPhT  [facility], [place] [place], [facility] - Oral Chemotherapy Patient Advocate Specialist III Phone: [phone]  Fax: [phone]

## 2023-12-29 ENCOUNTER — Other Ambulatory Visit: Payer: Self-pay

## 2023-12-30 ENCOUNTER — Other Ambulatory Visit: Payer: Self-pay

## 2023-12-30 ENCOUNTER — Other Ambulatory Visit: Payer: Self-pay | Admitting: Licensed Clinical Social Worker

## 2023-12-30 DIAGNOSIS — F419 Anxiety disorder, unspecified: Secondary | ICD-10-CM

## 2023-12-30 DIAGNOSIS — Z1379 Encounter for other screening for genetic and chromosomal anomalies: Secondary | ICD-10-CM

## 2023-12-30 DIAGNOSIS — F321 Major depressive disorder, single episode, moderate: Secondary | ICD-10-CM

## 2023-12-31 ENCOUNTER — Inpatient Hospital Stay

## 2023-12-31 ENCOUNTER — Other Ambulatory Visit (HOSPITAL_COMMUNITY): Payer: Self-pay

## 2023-12-31 ENCOUNTER — Encounter: Payer: Self-pay | Admitting: Licensed Clinical Social Worker

## 2023-12-31 ENCOUNTER — Inpatient Hospital Stay: Admitting: Licensed Clinical Social Worker

## 2023-12-31 VITALS — BP 149/78 | HR 53 | Temp 97.2°F | Resp 18 | Wt 231.4 lb

## 2023-12-31 DIAGNOSIS — C541 Malignant neoplasm of endometrium: Secondary | ICD-10-CM

## 2023-12-31 DIAGNOSIS — Z808 Family history of malignant neoplasm of other organs or systems: Secondary | ICD-10-CM

## 2023-12-31 DIAGNOSIS — Z95828 Presence of other vascular implants and grafts: Secondary | ICD-10-CM

## 2023-12-31 DIAGNOSIS — Z5112 Encounter for antineoplastic immunotherapy: Secondary | ICD-10-CM | POA: Diagnosis not present

## 2023-12-31 DIAGNOSIS — Z8041 Family history of malignant neoplasm of ovary: Secondary | ICD-10-CM

## 2023-12-31 DIAGNOSIS — Z803 Family history of malignant neoplasm of breast: Secondary | ICD-10-CM

## 2023-12-31 LAB — CBC WITH DIFFERENTIAL/PLATELET
Abs Immature Granulocytes: 0.01 K/uL (ref 0.00–0.07)
Basophils Absolute: 0.1 K/uL (ref 0.0–0.1)
Basophils Relative: 1 %
Eosinophils Absolute: 0.3 K/uL (ref 0.0–0.5)
Eosinophils Relative: 4 %
HCT: 38 % (ref 36.0–46.0)
Hemoglobin: 12.4 g/dL (ref 12.0–15.0)
Immature Granulocytes: 0 %
Lymphocytes Relative: 34 %
Lymphs Abs: 2.3 K/uL (ref 0.7–4.0)
MCH: 28.1 pg (ref 26.0–34.0)
MCHC: 32.6 g/dL (ref 30.0–36.0)
MCV: 86.2 fL (ref 80.0–100.0)
Monocytes Absolute: 0.6 K/uL (ref 0.1–1.0)
Monocytes Relative: 9 %
Neutro Abs: 3.4 K/uL (ref 1.7–7.7)
Neutrophils Relative %: 52 %
Platelets: 262 K/uL (ref 150–400)
RBC: 4.41 MIL/uL (ref 3.87–5.11)
RDW: 14.2 % (ref 11.5–15.5)
WBC: 6.6 K/uL (ref 4.0–10.5)
nRBC: 0 % (ref 0.0–0.2)

## 2023-12-31 LAB — COMPREHENSIVE METABOLIC PANEL WITH GFR
ALT: 15 U/L (ref 0–44)
AST: 19 U/L (ref 15–41)
Albumin: 4.1 g/dL (ref 3.5–5.0)
Alkaline Phosphatase: 107 U/L (ref 38–126)
Anion gap: 13 (ref 5–15)
BUN: 11 mg/dL (ref 8–23)
CO2: 22 mmol/L (ref 22–32)
Calcium: 9.1 mg/dL (ref 8.9–10.3)
Chloride: 107 mmol/L (ref 98–111)
Creatinine, Ser: 0.57 mg/dL (ref 0.44–1.00)
GFR, Estimated: >60 mL/min (ref >60)
Glucose, Bld: 104 mg/dL — ABNORMAL HIGH (ref 70–99)
Potassium: 3.8 mmol/L (ref 3.5–5.1)
Sodium: 142 mmol/L (ref 135–145)
Total Bilirubin: 0.3 mg/dL (ref 0.0–1.2)
Total Protein: 6.4 g/dL — ABNORMAL LOW (ref 6.5–8.1)

## 2023-12-31 LAB — TSH: TSH: 0.339 u[IU]/mL — ABNORMAL LOW (ref 0.350–4.500)

## 2023-12-31 MED ORDER — ALTEPLASE 2 MG IJ SOLR
2.0000 mg | Freq: Once | INTRAMUSCULAR | Status: AC
  Administered 2023-12-31: 2 mg
  Filled 2023-12-31: qty 2

## 2023-12-31 MED ORDER — LIDOCAINE-PRILOCAINE 2.5-2.5 % EX CREA
TOPICAL_CREAM | CUTANEOUS | 3 refills | Status: AC
  Filled 2023-12-31 – 2024-01-07 (×3): qty 30, 30d supply, fill #0

## 2023-12-31 MED ORDER — SODIUM CHLORIDE 0.9 % IV SOLN
200.0000 mg | Freq: Once | INTRAVENOUS | Status: AC
  Administered 2023-12-31: 200 mg via INTRAVENOUS
  Filled 2023-12-31: qty 8

## 2023-12-31 MED ORDER — SODIUM CHLORIDE 0.9 % IV SOLN: INTRAVENOUS | Status: DC

## 2023-12-31 MED ORDER — PROCHLORPERAZINE MALEATE 10 MG PO TABS
10.0000 mg | ORAL_TABLET | Freq: Four times a day (QID) | ORAL | 1 refills | Status: DC | PRN
  Filled 2023-12-31: qty 30, 7d supply, fill #0

## 2023-12-31 NOTE — Patient Instructions (Signed)
 Pembrolizumab Injection What is this medication? PEMBROLIZUMAB (PEM broe LIZ ue mab) treats some types of cancer. It works by helping your immune system slow or stop the spread of cancer cells. It is a monoclonal antibody. This medicine may be used for other purposes; ask your health care provider or pharmacist if you have questions. COMMON BRAND NAME(S): Keytruda What should I tell my care team before I take this medication? They need to know if you have any of these conditions: Allogeneic stem cell transplant (uses someone else's stem cells) Autoimmune diseases, such as Crohn disease, ulcerative colitis, lupus History of chest radiation Nervous system problems, such as Guillain-Barre syndrome, myasthenia gravis Organ transplant An unusual or allergic reaction to pembrolizumab, other medications, foods, dyes, or preservatives Pregnant or trying to get pregnant Breast-feeding How should I use this medication? This medication is injected into a vein. It is given by your care team in a hospital or clinic setting. A special MedGuide will be given to you before each treatment. Be sure to read this information carefully each time. Talk to your care team about the use of this medication in children. While it may be prescribed for children as young as 6 months for selected conditions, precautions do apply. Overdosage: If you think you have taken too much of this medicine contact a poison control center or emergency room at once. NOTE: This medicine is only for you. Do not share this medicine with others. What if I miss a dose? Keep appointments for follow-up doses. It is important not to miss your dose. Call your care team if you are unable to keep an appointment. What may interact with this medication? Interactions have not been studied. This list may not describe all possible interactions. Give your health care provider a list of all the medicines, herbs, non-prescription drugs, or dietary  supplements you use. Also tell them if you smoke, drink alcohol, or use illegal drugs. Some items may interact with your medicine. What should I watch for while using this medication? Your condition will be monitored carefully while you are receiving this medication. You may need blood work while taking this medication. This medication may cause serious skin reactions. They can happen weeks to months after starting the medication. Contact your care team right away if you notice fevers or flu-like symptoms with a rash. The rash may be red or purple and then turn into blisters or peeling of the skin. You may also notice a red rash with swelling of the face, lips, or lymph nodes in your neck or under your arms. Tell your care team right away if you have any change in your eyesight. Talk to your care team if you may be pregnant. Serious birth defects can occur if you take this medication during pregnancy and for 4 months after the last dose. You will need a negative pregnancy test before starting this medication. Contraception is recommended while taking this medication and for 4 months after the last dose. Your care team can help you find the option that works for you. Do not breastfeed while taking this medication and for 4 months after the last dose. What side effects may I notice from receiving this medication? Side effects that you should report to your care team as soon as possible: Allergic reactions--skin rash, itching, hives, swelling of the face, lips, tongue, or throat Dry cough, shortness of breath or trouble breathing Eye pain, redness, irritation, or discharge with blurry or decreased vision Heart muscle inflammation--unusual weakness or  fatigue, shortness of breath, chest pain, fast or irregular heartbeat, dizziness, swelling of the ankles, feet, or hands Hormone gland problems--headache, sensitivity to light, unusual weakness or fatigue, dizziness, fast or irregular heartbeat, increased  sensitivity to cold or heat, excessive sweating, constipation, hair loss, increased thirst or amount of urine, tremors or shaking, irritability Infusion reactions--chest pain, shortness of breath or trouble breathing, feeling faint or lightheaded Kidney injury (glomerulonephritis)--decrease in the amount of urine, red or dark brown urine, foamy or bubbly urine, swelling of the ankles, hands, or feet Liver injury--right upper belly pain, loss of appetite, nausea, light-colored stool, dark yellow or brown urine, yellowing skin or eyes, unusual weakness or fatigue Pain, tingling, or numbness in the hands or feet, muscle weakness, change in vision, confusion or trouble speaking, loss of balance or coordination, trouble walking, seizures Rash, fever, and swollen lymph nodes Redness, blistering, peeling, or loosening of the skin, including inside the mouth Sudden or severe stomach pain, bloody diarrhea, fever, nausea, vomiting Side effects that usually do not require medical attention (report to your care team if they continue or are bothersome): Bone, joint, or muscle pain Diarrhea Fatigue Loss of appetite Nausea Skin rash This list may not describe all possible side effects. Call your doctor for medical advice about side effects. You may report side effects to FDA at 1-800-FDA-1088. Where should I keep my medication? This medication is given in a hospital or clinic. It will not be stored at home. NOTE: This sheet is a summary. It may not cover all possible information. If you have questions about this medicine, talk to your doctor, pharmacist, or health care provider.  2024 Elsevier/Gold Standard (2021-05-21 00:00:00)  [facility] - A DEPT OF [facility]  Discharge Instructions: Thank you for choosing [facility] to provide your oncology and hematology care.  If you have a lab appointment with the Cancer Center - please note that after April 8th,  2024, all labs will be drawn in the cancer center.  You do not have to check in or register with the main entrance as you have in the past but will complete your check-in in the cancer center.  Wear comfortable clothing and clothing appropriate for easy access to any Portacath or PICC line.   We strive to give you quality time with your provider. You may need to reschedule your appointment if you arrive late (15 or more minutes).  Arriving late affects you and other patients whose appointments are after yours.  Also, if you miss three or more appointments without notifying the office, you may be dismissed from the clinic at the providers discretion.      For prescription refill requests, have your pharmacy contact our office and allow 72 hours for refills to be completed.    Today you received the following chemotherapy and/or immunotherapy agents sharion      To help prevent nausea and vomiting after your treatment, we encourage you to take your nausea medication as directed.  BELOW ARE SYMPTOMS THAT SHOULD BE REPORTED IMMEDIATELY: *FEVER GREATER THAN 100.4 F (38 C) OR HIGHER *CHILLS OR SWEATING *NAUSEA AND VOMITING THAT IS NOT CONTROLLED WITH YOUR NAUSEA MEDICATION *UNUSUAL SHORTNESS OF BREATH *UNUSUAL BRUISING OR BLEEDING *URINARY PROBLEMS (pain or burning when urinating, or frequent urination) *BOWEL PROBLEMS (unusual diarrhea, constipation, pain near the anus) TENDERNESS IN MOUTH AND THROAT WITH OR WITHOUT PRESENCE OF ULCERS (sore throat, sores in mouth, or a toothache) UNUSUAL RASH, SWELLING  OR PAIN  UNUSUAL VAGINAL DISCHARGE OR ITCHING   Items with * indicate a potential emergency and should be followed up as soon as possible or go to the Emergency Department if any problems should occur.  Please show the CHEMOTHERAPY ALERT CARD or IMMUNOTHERAPY ALERT CARD at check-in to the Emergency Department and triage nurse.  Should you have questions after your visit or need to cancel or  reschedule your appointment, please contact [facility]  - A DEPT OF [name] [phone]  and follow the prompts.  Office hours are 8:00 a.m. to 4:30 p.m. Monday - Friday. Please note that voicemails left after 4:00 p.m. may not be returned until the following business day.  We are closed weekends and major holidays. You have access to a nurse at all times for urgent questions. Please call the main number to the clinic [phone] and follow the prompts.  For any non-urgent questions, you may also contact your provider using MyChart. We now offer e-Visits for anyone 70 and older to request care online for non-urgent symptoms. For details visit [email or web address].   Also download the MyChart app! Go to the app store, search MyChart, open the app, select [facility], and log in with your MyChart username and password.

## 2023-12-31 NOTE — Progress Notes (Signed)
 Patient tolerated therapy with no complaints voiced.  Side effects with management reviewed with understanding verbalized.  Port site clean and dry with no bruising or swelling noted at site.  Good blood return noted before and after administration of therapy.  Band aid applied.  Patient left in satisfactory condition with VSS and no s/s of distress noted.

## 2023-12-31 NOTE — Progress Notes (Signed)
 Pharmacist Chemotherapy Monitoring - Initial Assessment    Anticipated start date: 12/31/23   The following has been reviewed per standard work regarding the patient's treatment regimen: The patient's diagnosis, treatment plan and drug doses, and organ/hematologic function Lab orders and baseline tests specific to treatment regimen  The treatment plan start date, drug sequencing, and pre-medications Prior authorization status  Patient's documented medication list, including drug-drug interaction screen and prescriptions for anti-emetics and supportive care specific to the treatment regimen The drug concentrations, fluid compatibility, administration routes, and timing of the medications to be used The patient's access for treatment and lifetime cumulative dose history, if applicable  The patient's medication allergies and previous infusion related reactions, if applicable   Changes made to treatment plan:  N/A  Follow up needed:  N/A   Jessica [name] Molt, [facility], 12/31/2023  9:31 AM

## 2023-12-31 NOTE — Progress Notes (Signed)
 REFERRING PROVIDER: Viktoria Comer SAUNDERS, MD [address] [place],  [place] [address]  PRIMARY PROVIDER:  [name], FNP  PRIMARY REASON FOR VISIT:  1. Endometrial cancer (HCC)   2. Family history of ovarian cancer   3. Family history of breast cancer   4. Family history of melanoma    I connected with Jessica [name] on 12/31/2023 at 9:00 AM EDT by telephone and verified that I am speaking with the correct person using three identifiers.    Patient location: home Provider location: [facility]  HISTORY OF PRESENT ILLNESS:   Jessica [name], a 63 y.o. female, was seen for a [place] cancer genetics consultation at the request of Dr. Viktoria due to a personal and family history of cancer.  Jessica [name] presents to clinic today to discuss the possibility of a hereditary predisposition to cancer, genetic testing, and to further clarify her future cancer risks, as well as potential cancer risks for family members.   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 This was treated with TAH-BSO, MMR normal, MSI-Stable, and chemotherapy. She had recurrence of this in late 2020.  Oncology History  Endometrial cancer (HCC)  03/31/2018 Initial Diagnosis   Endometrial cancer (HCC)   12/29/2018 - 10/20/2023 Chemotherapy   Patient is on Treatment Plan : UTERINE Carboplatin  AUC 6 / Paclitaxel  q21d     06/06/2020 Miscellaneous   Summary of oncologic history from Dr. Katragadda   1.  Recurrent endometrial carcinosarcoma: -TAH, BSO, bilateral pelvic and para-aortic lymph node resections on 04/13/2018. -Pathology showed carcinosarcoma (malignant mixed mullerian tumor) is arising in an endometrial type polyp with no myometrial invasion identified.  High-grade.  0/39 lymph nodes positive.  PT1APN0, FIGO stage Ia.  MMR normal.  MSI-stable. -CTAP on 12/09/2018 for abdominal pain showed extensive peritoneal carcinomatosis and large soft  tissue mass in the pelvis. -Biopsy of the omental mass on 12/28/2018 shows poorly differentiated carcinoma consistent with her prior malignancy. -Carboplatin  and paclitaxel  started on 12/29/2018. -CT scan on 05/02/2019 showed peritoneal implants in the low central small bowel mesentery and left pelvic sidewall have decreased in size measuring 1.5 x 1.9 cm and 1.7 x 2.2 cm.  Soft tissue nodule in the left lateral omentum measures 1 x 1.7 cm with no evidence of metastatic disease. -Continuation of chemotherapy until complete response was recommended. -CT AP on 07/04/2019 showed continued positive response to therapy with scattered peritoneal metastasis in the pelvis and left omentum decreased in size.  No new metastatic disease. -CTAP on 10/24/2019 after 14 cycles showed substantial reduction in size of soft tissue nodules in the pelvis.  1.4 x 0.9 x 1 cm soft tissue density in the inferior serosal margin of the sigmoid colon, previously 1.7 x 1.1 x 1.7 cm.  2 small soft tissue nodules in the mesenteric adipose tissue above the urinary bladder measures 0.8 cm. -She has developed serious allergic reaction during cycle 14 with carboplatin . -I have discussed with Dr. Eloy.  Other options include adding ifosfamide to Taxol  or Doxil and combination of lenvatinib  with pembrolizumab.  In the lenvatinib  pembrolizumab trial carcinosarcomas were not included. -Single agent paclitaxel  started on 11/09/2019, dose reduced by 20% on 01/18/2020. -CTAP on 01/10/2020 with stable small peritoneal soft tissue nodules in the pelvis.  Resolution of soft tissue nodularity in the left abdominal omental fat with no new or progressive disease.     06/20/2020 Imaging   1. Stable examination status post  hysterectomy without suspicious enhancing soft tissue nodularity at the vaginal cuff and no significant change in the multiple small soft tissue nodules in the pelvis likely representing treated tumor. No new or progressive findings. 2.  Cholelithiasis including a large gallstone in the neck of the gallbladder but without CT evidence of acute cholecystitis. 3. Small hiatal hernia. 4. Aortic atherosclerosis.     12/31/2023 -  Chemotherapy   Patient is on Treatment Plan : UTERINE Lenvatinib  (20) D1-21 + Pembrolizumab (200) D1 q21d       RELEVANT MEDICAL HISTORY:  Menarche was at age 77.  First live birth at age 75.  Ovaries intact: no.  Hysterectomy: yes.  Menopausal status: postmenopausal.  HRT use: 0 years. Last mammogram: 2025  Past Medical History:  Diagnosis Date   Allergy    Anemia    Anxiety    Arthritis    Cancer (HCC)    Phreesia 09/05/2019   Cancer of skin of back    Chest pain    Depression    Elevated blood pressure reading    Endometrial cancer (HCC)    Hyperlipidemia    Hypertension    PMB (postmenopausal bleeding)    Port-A-Cath in place 12/24/2018   Pre-diabetes     Past Surgical History:  Procedure Laterality Date   IR IMAGING GUIDED PORT INSERTION  12/28/2018   IR RADIOLOGIST EVAL & MGMT  06/02/2018   IR RADIOLOGIST EVAL & MGMT  06/16/2018   NECK SURGERY  2000   spinal surgery , titanium rod in place    ROBOTIC ASSISTED TOTAL HYSTERECTOMY WITH BILATERAL SALPINGO OOPHERECTOMY N/A 04/13/2018   Procedure: XI ROBOTIC ASSISTED TOTAL HYSTERECTOMY WITH BILATERAL SALPINGO OOPHORECTOMY;  Surgeon: Eloy Herring, MD;  Location: [facility];  Service: Gynecology;  Laterality: N/A;   ROBOTIC PELVIC AND PARA-AORTIC LYMPH NODE DISSECTION N/A 04/13/2018   Procedure: XI ROBOTIC PELVIC LYMPHADECTOMY AND PARA-AORTIC LYMPH NODE DISSECTION;  Surgeon: Eloy Herring, MD;  Location: [facility];  Service: Gynecology;  Laterality: N/A;   TOTAL HIP ARTHROPLASTY Right 09/19/2022   Procedure: RIGHT TOTAL HIP ARTHROPLASTY ANTERIOR APPROACH;  Surgeon: Vernetta Lonni GRADE, MD;  Location: [facility];  Service: Orthopedics;  Laterality: Right;    FAMILY HISTORY:  We obtained a detailed, 4-generation family history.   Significant diagnoses are listed below: Family History  Problem Relation Age of Onset   Hypertension Mother    Breast cancer Mother 71   Hypertension Father    Heart disease Father    Lung cancer Father    Ovarian cancer Sister 14   Melanoma Sister 71   Cancer Maternal Aunt        unk type   Cancer Maternal Uncle        unk type   Skin cancer Paternal Grandmother    Breast cancer Maternal Cousin        dx 74s   Colon cancer Neg Hx    Endometrial cancer Neg Hx    Pancreatic cancer Neg Hx    Prostate cancer Neg Hx    Jessica [name] has 1 son, 1 and two daughters, 12 and 80. She has 1 sister and 3 brothers. Her sister had ovarian cancer and melanoma at 62, did not have genetic testing.   Jessica [name]'s mother had breast cancer at 109. A maternal aunt and uncle both had cancer, unknown types. Maternal first cousin's daughter had breast cancer recently in her late 75s.  Jessica [name]'s father had lung cancer and passed at 60, history of  smoking. Possible skin cancers in paternal aunts/uncles. Paternal grandmother had skin cancer.   Jessica [name] is unaware of previous family history of genetic testing for hereditary cancer risks. There is no reported Ashkenazi Jewish ancestry. There is no known consanguinity.    GENETIC COUNSELING ASSESSMENT: Jessica [name] is a 63 y.o. female with a personal history of endometrial cancer and family history of breast and ovarian cancer  which is somewhat suggestive of a hereditary cancer syndrome and predisposition to cancer. We, therefore, discussed and recommended the following at today's visit.   DISCUSSION: We discussed that, in general, most cancer is not inherited in families, but instead is sporadic or familial. We discussed that approximately 10% of cancer is hereditary. Most cases of hereditary endometrial cancer are associated with Lynch syndrome genes, although there are other genes associated with hereditary cancer as well including BRCA1/2.  Cancers and risks are gene specific. We discussed that testing is beneficial for several reasons including knowing about cancer risks, identifying potential screening and risk-reduction options that may be appropriate, and to understand if other family members could be at risk for cancer and allow them to undergo genetic testing.   We reviewed the characteristics, features and inheritance patterns of hereditary cancer syndromes. We also discussed genetic testing, including the appropriate family members to test, the process of testing, insurance coverage and turn-around-time for results. We discussed the implications of a negative, positive and/or variant of uncertain significant result. We recommended Jessica [name] pursue genetic testing for the [facility] Common Hereditary Cancers+RNA gene panel.   The Common Hereditary Cancers Panel + RNA offered by [facility] includes sequencing and/or deletion duplication testing of the following 48 genes: APC*, ATM*, AXIN2, BAP1, BARD1, BMPR1A, BRCA1, BRCA2, BRIP1, CDH1, CDK4, CDKN2A (p14ARF), CDKN2A (p16INK4a), CHEK2, CTNNA1, DICER1*, EPCAM*, FH*, GREM1*, HOXB13, KIT, MBD4, MEN1*, MLH1*, MSH2*, MSH3*, MSH6*, MUTYH, NF1*, NTHL1, PALB2, PDGFRA, PMS2*, POLD1*, POLE, PTEN*, RAD51C, RAD51D, SDHA*, SDHB, SDHC*, SDHD, SMAD4, SMARCA4, STK11, TP53, TSC1*, TSC2, VHL.   Based on Ms. Lietz's personal and family history of cancer, she meets medical criteria for genetic testing. Despite that she meets criteria, she may still have an out of pocket cost.  PLAN: After considering the risks, benefits, and limitations, Jessica [name] provided informed consent to pursue genetic testing and the blood sample was sent to [facility] for analysis of the Common Hereditary Cancers+RNA Panel. Results should be available within approximately 2-3 weeks' time, at which point they will be disclosed by telephone to Jessica [name], as will any additional recommendations warranted by these results.  Jessica [name] will receive a summary of her genetic counseling visit and a copy of her results once available. This information will also be available in Epic.   Jessica [name] questions were answered to her satisfaction today. Our contact information was provided should additional questions or concerns arise. Thank you for the referral and allowing us  to share in the care of your patient.   Dena Cary, MS, [facility] Genetic Counselor [email or web address] Phone: [phone]  I personally spent a total of 50 minutes in the care of the patient today including counseling and educating, placing orders, and documenting clinical information in the EHR.  Dr. Delinda was available for discussion regarding this case.   _______________________________________________________________________ For Office Staff:  Number of people involved in session: 1 Was an Intern/ student involved with case: no

## 2024-01-01 LAB — T4: T4, Total: 6.8 ug/dL (ref 4.5–12.0)

## 2024-01-02 ENCOUNTER — Other Ambulatory Visit (HOSPITAL_COMMUNITY): Payer: Self-pay

## 2024-01-05 ENCOUNTER — Encounter (INDEPENDENT_AMBULATORY_CARE_PROVIDER_SITE_OTHER): Payer: Self-pay | Admitting: *Deleted

## 2024-01-05 ENCOUNTER — Other Ambulatory Visit (HOSPITAL_COMMUNITY): Payer: Self-pay

## 2024-01-05 ENCOUNTER — Ambulatory Visit

## 2024-01-05 VITALS — BP 140/78 | HR 54 | Ht 64.0 in | Wt 230.0 lb

## 2024-01-05 DIAGNOSIS — C541 Malignant neoplasm of endometrium: Secondary | ICD-10-CM

## 2024-01-05 DIAGNOSIS — I10 Essential (primary) hypertension: Secondary | ICD-10-CM

## 2024-01-05 DIAGNOSIS — N3946 Mixed incontinence: Secondary | ICD-10-CM

## 2024-01-05 DIAGNOSIS — R197 Diarrhea, unspecified: Secondary | ICD-10-CM

## 2024-01-05 DIAGNOSIS — F419 Anxiety disorder, unspecified: Secondary | ICD-10-CM | POA: Diagnosis not present

## 2024-01-05 DIAGNOSIS — Z9889 Other specified postprocedural states: Secondary | ICD-10-CM

## 2024-01-05 DIAGNOSIS — E782 Mixed hyperlipidemia: Secondary | ICD-10-CM | POA: Diagnosis not present

## 2024-01-05 DIAGNOSIS — Z95828 Presence of other vascular implants and grafts: Secondary | ICD-10-CM

## 2024-01-05 MED ORDER — PROCHLORPERAZINE MALEATE 10 MG PO TABS: 10.0000 mg | ORAL_TABLET | Freq: Four times a day (QID) | ORAL | 1 refills | Status: AC | PRN

## 2024-01-05 NOTE — Progress Notes (Unsigned)
 Established Patient Office Visit  Subjective   Patient ID: Jessica [name], female    DOB: Jan 18, 1961  Age: 63 y.o. MRN: [id number]  Chief Complaint  Patient presents with   Medical Management of Chronic Issues    Pt here for a 6 month follow up     HPI Discussed the use of AI scribe software for clinical note transcription with the patient, who gave verbal consent to proceed.  History of Present Illness    [name] is a 63 year old female who presents for a six-month follow-up and concerns about elevated blood pressure and neck pain.  Hypertension and chemotherapy monitoring - Concern for elevated blood pressure, most recent measurement 145/80 mmHg - Recently started new chemotherapy regimen including Keytruda  infusions and two different milligram doses of oral medications at home - Requires close monitoring of blood pressure due to chemotherapy regimen - No home blood pressure monitor available - Headaches occurring on the right side, not severe, attributed to new medication  Neck and shoulder pain - Significant neck and shoulder pain - History of neck surgery in 2020 in [place] - PET scan one month ago revealed a fractured screw from prior neck surgery, backed out by seven millimeters - Uncertain if pain is related to screw issue - Unable to recall name of surgeon or facility  Abdominal tenderness - New area of tenderness and sensitivity on abdomen after cat jumped on her - Describes area as 'lumpy' and painful to touch - No prior awareness of this area before incident  Diarrhea - Experiencing urgent diarrhea, sometimes occurring without warning - Prefers diarrhea over constipation - Awaiting referral to gastroenterologist from [facility] GYN oncologist, referral not yet received  Urinary incontinence - Urinary incontinence with urgency and with activities such as sneezing, coughing, or bending over - Wears a pad daily to manage leakage      Patient  Active Problem List   Diagnosis Date Noted   History of cervical spinal surgery 01/06/2024   Mixed stress and urge urinary incontinence 01/06/2024   SK (seborrheic keratosis) 10/19/2023   Onychomycosis 10/19/2023   Abdominal pain 09/26/2023   Neoplasm related pain 09/26/2023   Nausea 09/26/2023   Diarrhea 09/26/2023   Obesity (BMI 30-39.9) 07/07/2023   Breast cancer screening by mammogram 07/07/2023   Need for Tdap vaccination 01/08/2023   BPPV (benign paroxysmal positional vertigo) 01/08/2023   Status post total replacement of right hip 09/19/2022   Cardiac chest pain 04/10/2022   Cholelithiasis 12/20/2021   Vitamin D  deficiency 06/18/2021   Prediabetes 06/18/2021   Annual physical exam 06/18/2021   Impaired vision in both eyes 06/18/2021   Breast tenderness in female 06/18/2021   Headache 02/11/2021   Morbid obesity (HCC) 01/28/2021   Insomnia 01/28/2021   SIRS (systemic inflammatory response syndrome) (HCC) 12/07/2020   Hypotension    Peripheral neuropathy due to chemotherapy 06/27/2020   Anemia due to antineoplastic chemotherapy 06/27/2020   Depression, major, single episode, moderate (HCC) 09/07/2019   Anxiety 09/07/2019   Port-A-Cath in place 12/24/2018   Goals of care, counseling/discussion 12/23/2018   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HCC) 12/14/2018   Elevated LFTs 05/05/2018   Hyperlipidemia 04/27/2018   Essential hypertension 04/26/2018   Endometrial cancer (HCC) 03/31/2018   H/O total hysterectomy 03/2018    ROS    Objective:     BP (!) 140/78 (BP Location: Left Arm, Patient Position: Sitting, Cuff Size: Normal)   Pulse (!) 54  Ht 5' 4 (1.626 m)   Wt 230 lb (104.3 kg)   SpO2 95%   BMI 39.48 kg/m  BP Readings from Last 3 Encounters:  01/05/24 (!) 140/78  12/31/23 (!) 149/78  12/23/23 133/85   Wt Readings from Last 3 Encounters:  01/05/24 230 lb (104.3 kg)  12/31/23 231 lb 6.4 oz (105 kg)  12/23/23 230 lb 13.2 oz (104.7 kg)      Physical Exam Vitals and nursing note reviewed.  Constitutional:      Appearance: Normal appearance. She is obese.  HENT:     Head: Normocephalic.     Right Ear: Tympanic membrane, ear canal and external ear normal.     Left Ear: Tympanic membrane, ear canal and external ear normal.     Nose: Nose normal.     Mouth/Throat:     Mouth: Mucous membranes are moist.     Pharynx: Oropharynx is clear.  Eyes:     Extraocular Movements: Extraocular movements intact.     Pupils: Pupils are equal, round, and reactive to light.  Cardiovascular:     Rate and Rhythm: Normal rate and regular rhythm.  Pulmonary:     Effort: Pulmonary effort is normal.     Breath sounds: Normal breath sounds.  Abdominal:     General: Abdomen is flat. Bowel sounds are normal.     Palpations: Abdomen is soft.     Tenderness: There is no right CVA tenderness or left CVA tenderness.  Musculoskeletal:        General: Normal range of motion.     Cervical back: Normal range of motion and neck supple.  Skin:    General: Skin is warm and dry.  Neurological:     Mental Status: She is alert and oriented to person, place, and time.  Psychiatric:        Mood and Affect: Mood normal.        Thought Content: Thought content normal.     No results found for any visits on 01/05/24.  Last CBC Lab Results  Component Value Date   WBC 6.6 12/31/2023   HGB 12.4 12/31/2023   HCT 38.0 12/31/2023   MCV 86.2 12/31/2023   MCH 28.1 12/31/2023   RDW 14.2 12/31/2023   PLT 262 12/31/2023   Last metabolic panel Lab Results  Component Value Date   GLUCOSE 104 (H) 12/31/2023   NA 142 12/31/2023   K 3.8 12/31/2023   CL 107 12/31/2023   CO2 22 12/31/2023   BUN 11 12/31/2023   CREATININE 0.57 12/31/2023   GFRNONAA >60 12/31/2023   CALCIUM  9.1 12/31/2023   PROT 6.4 (L) 12/31/2023   ALBUMIN 4.1 12/31/2023   LABGLOB 2.6 07/07/2023   AGRATIO 1.9 04/26/2018   BILITOT 0.3 12/31/2023   ALKPHOS 107 12/31/2023   AST 19  12/31/2023   ALT 15 12/31/2023   ANIONGAP 13 12/31/2023   Last lipids Lab Results  Component Value Date   CHOL 201 (H) 07/07/2023   HDL 92 07/07/2023   LDLCALC 99 07/07/2023   TRIG 53 07/07/2023   CHOLHDL 2.2 07/07/2023   Last hemoglobin A1c Lab Results  Component Value Date   HGBA1C 6.2 (H) 07/07/2023   Last thyroid  functions Lab Results  Component Value Date   TSH 0.339 (L) 12/31/2023   T4TOTAL 6.8 12/31/2023   FREET4 1.03 07/07/2023   Last vitamin D  Lab Results  Component Value Date   VD25OH 45.7 07/07/2023   Last vitamin B12 and Folate Lab  Results  Component Value Date   VITAMINB12 864 07/07/2023   FOLATE >20.0 07/07/2023      The 10-year ASCVD risk score (Arnett DK, et al., 2019) is: 4.3%    Assessment & Plan:   Problem List Items Addressed This Visit       Cardiovascular and Mediastinum   Essential hypertension - Primary (Chronic)   Blood pressure elevated at 145/80 mmHg, decreased to 140/70 mmHg. Previously controlled at 124/60 mmHg. Potential increase in amlodipine  if elevated. - Monitor blood pressure at home. - Consider increasing amlodipine  to 10 mg if blood pressure remains elevated. - Advised obtaining a home blood pressure monitor.        Genitourinary   Endometrial cancer (HCC)   Undergoing treatment with Keytruda  and oral medications. Blood pressure monitoring advised due to chemotherapy side effects. - Continue Keytruda  infusion and oral medications as prescribed. - Monitor blood pressure closely.      Relevant Medications   prochlorperazine  (COMPAZINE ) 10 MG tablet     Other   Hyperlipidemia (Chronic)   She is currently prescribed atorvastatin  20 mg daily.  Repeat lipid panel ordered today.      Anxiety   Diarrhea   Experiencing frequent diarrhea, possibly related to chemotherapy. - Referred to gastroenterologist for evaluation. - Consider adding a probiotic.      Relevant Orders   Ambulatory referral to Gastroenterology    History of cervical spinal surgery   Fractured and backed out screw from 2020 neck surgery causing pain. Referral to neurosurgeon needed. - Referred to neurosurgeon for evaluation. - Advised obtaining medical records from previous surgery for review.      Relevant Orders   Ambulatory referral to Neurosurgery   Mixed stress and urge urinary incontinence   Experiencing urgency and leakage with sneezing, coughing, and bending over. - She wears a pad daily for leakage.   - Needs letter of medical necessity for incontinence supplies.          No follow-ups on file.    Leita Longs, FNP

## 2024-01-06 ENCOUNTER — Other Ambulatory Visit: Payer: Self-pay

## 2024-01-06 DIAGNOSIS — Z9889 Other specified postprocedural states: Secondary | ICD-10-CM | POA: Insufficient documentation

## 2024-01-06 DIAGNOSIS — N3946 Mixed incontinence: Secondary | ICD-10-CM | POA: Insufficient documentation

## 2024-01-06 NOTE — Assessment & Plan Note (Signed)
She is currently prescribed atorvastatin 20 mg daily. -Repeat lipid panel ordered today 

## 2024-01-06 NOTE — Assessment & Plan Note (Signed)
 Undergoing treatment with Keytruda  and oral medications. Blood pressure monitoring advised due to chemotherapy side effects. - Continue Keytruda  infusion and oral medications as prescribed. - Monitor blood pressure closely.

## 2024-01-06 NOTE — Assessment & Plan Note (Signed)
 Experiencing frequent diarrhea, possibly related to chemotherapy. - Referred to gastroenterologist for evaluation. - Consider adding a probiotic.

## 2024-01-06 NOTE — Assessment & Plan Note (Signed)
 Fractured and backed out screw from 2020 neck surgery causing pain. Referral to neurosurgeon needed. - Referred to neurosurgeon for evaluation. - Advised obtaining medical records from previous surgery for review.

## 2024-01-06 NOTE — Assessment & Plan Note (Signed)
 Blood pressure elevated at 145/80 mmHg, decreased to 140/70 mmHg. Previously controlled at 124/60 mmHg. Potential increase in amlodipine  if elevated. - Monitor blood pressure at home. - Consider increasing amlodipine  to 10 mg if blood pressure remains elevated. - Advised obtaining a home blood pressure monitor.

## 2024-01-06 NOTE — Assessment & Plan Note (Signed)
 Experiencing urgency and leakage with sneezing, coughing, and bending over. - She wears a pad daily for leakage.   - Needs letter of medical necessity for incontinence supplies.

## 2024-01-07 ENCOUNTER — Other Ambulatory Visit: Payer: Self-pay

## 2024-01-07 ENCOUNTER — Other Ambulatory Visit (HOSPITAL_COMMUNITY): Payer: Self-pay

## 2024-01-11 ENCOUNTER — Ambulatory Visit: Payer: Self-pay | Admitting: Licensed Clinical Social Worker

## 2024-01-11 ENCOUNTER — Other Ambulatory Visit (HOSPITAL_COMMUNITY): Payer: Self-pay

## 2024-01-11 ENCOUNTER — Encounter: Payer: Self-pay | Admitting: Licensed Clinical Social Worker

## 2024-01-11 ENCOUNTER — Other Ambulatory Visit: Payer: Self-pay

## 2024-01-11 ENCOUNTER — Telehealth: Payer: Self-pay | Admitting: Licensed Clinical Social Worker

## 2024-01-11 DIAGNOSIS — Z1379 Encounter for other screening for genetic and chromosomal anomalies: Secondary | ICD-10-CM

## 2024-01-11 NOTE — Telephone Encounter (Signed)
 I contacted Ms. Spelman via phone message and MyChart message to discuss her genetic testing results. No pathogenic variants were identified in the 48 genes analyzed. Detailed clinic note to follow.   The test report has been scanned into EPIC and is located under the Molecular Pathology section of the Results Review tab.  A portion of the result report is included below for reference.      Dena Cary, MS, [facility] Genetic Counselor [email or web address] Phone: [phone]

## 2024-01-11 NOTE — Progress Notes (Signed)
 HPI:   Jessica [name] was previously seen in the [place] [facility] clinic due to a personal and family history of cancer and concerns regarding a hereditary predisposition to cancer. Please refer to our prior cancer genetics clinic note for more information regarding our discussion, assessment and recommendations, at the time. Jessica [name] recent genetic test results were disclosed to her, as were recommendations warranted by these results. These results and recommendations are discussed in more detail below.  CANCER HISTORY:  Oncology History  Endometrial cancer (HCC)  03/31/2018 Initial Diagnosis   Endometrial cancer (HCC)   12/29/2018 - 10/20/2023 Chemotherapy   Patient is on Treatment Plan : UTERINE Carboplatin  AUC 6 / Paclitaxel  q21d     06/06/2020 Miscellaneous   Summary of oncologic history from Dr. Katragadda   1.  Recurrent endometrial carcinosarcoma: -TAH, BSO, bilateral pelvic and para-aortic lymph node resections on 04/13/2018. -Pathology showed carcinosarcoma (malignant mixed mullerian tumor) is arising in an endometrial type polyp with no myometrial invasion identified.  High-grade.  0/39 lymph nodes positive.  PT1APN0, FIGO stage Ia.  MMR normal.  MSI-stable. -CTAP on 12/09/2018 for abdominal pain showed extensive peritoneal carcinomatosis and large soft tissue mass in the pelvis. -Biopsy of the omental mass on 12/28/2018 shows poorly differentiated carcinoma consistent with her prior malignancy. -Carboplatin  and paclitaxel  started on 12/29/2018. -CT scan on 05/02/2019 showed peritoneal implants in the low central small bowel mesentery and left pelvic sidewall have decreased in size measuring 1.5 x 1.9 cm and 1.7 x 2.2 cm.  Soft tissue nodule in the left lateral omentum measures 1 x 1.7 cm with no evidence of metastatic disease. -Continuation of chemotherapy until complete response was recommended. -CT AP on 07/04/2019 showed continued positive response to therapy with  scattered peritoneal metastasis in the pelvis and left omentum decreased in size.  No new metastatic disease. -CTAP on 10/24/2019 after 14 cycles showed substantial reduction in size of soft tissue nodules in the pelvis.  1.4 x 0.9 x 1 cm soft tissue density in the inferior serosal margin of the sigmoid colon, previously 1.7 x 1.1 x 1.7 cm.  2 small soft tissue nodules in the mesenteric adipose tissue above the urinary bladder measures 0.8 cm. -She has developed serious allergic reaction during cycle 14 with carboplatin . -I have discussed with Dr. Eloy.  Other options include adding ifosfamide to Taxol  or Doxil and combination of lenvatinib  with pembrolizumab .  In the lenvatinib  pembrolizumab  trial carcinosarcomas were not included. -Single agent paclitaxel  started on 11/09/2019, dose reduced by 20% on 01/18/2020. -CTAP on 01/10/2020 with stable small peritoneal soft tissue nodules in the pelvis.  Resolution of soft tissue nodularity in the left abdominal omental fat with no new or progressive disease.     06/20/2020 Imaging   1. Stable examination status post hysterectomy without suspicious enhancing soft tissue nodularity at the vaginal cuff and no significant change in the multiple small soft tissue nodules in the pelvis likely representing treated tumor. No new or progressive findings. 2. Cholelithiasis including a large gallstone in the neck of the gallbladder but without CT evidence of acute cholecystitis. 3. Small hiatal hernia. 4. Aortic atherosclerosis.     12/31/2023 -  Chemotherapy   Patient is on Treatment Plan : UTERINE Lenvatinib  (20) D1-21 + Pembrolizumab  (200) D1 q21d      Genetic Testing   No pathogenic variants identified on the [facility] Common Hereditary Cancers+RNA panel. VUS in RAD51D called c.972G>T. The report date is 01/10/2024.  The Common Hereditary Cancers Panel +  RNA offered by [facility] includes sequencing and/or deletion duplication testing of the following 48 genes:  APC*, ATM*, AXIN2, BAP1, BARD1, BMPR1A, BRCA1, BRCA2, BRIP1, CDH1, CDK4, CDKN2A (p14ARF), CDKN2A (p16INK4a), CHEK2, CTNNA1, DICER1*, EPCAM*, FH*, GREM1*, HOXB13, KIT, MBD4, MEN1*, MLH1*, MSH2*, MSH3*, MSH6*, MUTYH, NF1*, NTHL1, PALB2, PDGFRA, PMS2*, POLD1*, POLE, PTEN*, RAD51C, RAD51D, SDHA*, SDHB, SDHC*, SDHD, SMAD4, SMARCA4, STK11, TP53, TSC1*, TSC2, VHL.      FAMILY HISTORY:  We obtained a detailed, 4-generation family history.  Significant diagnoses are listed below: Family History  Problem Relation Age of Onset   Hypertension Mother    Breast cancer Mother 60   Hypertension Father    Heart disease Father    Lung cancer Father    Ovarian cancer Sister 37   Melanoma Sister 58   Cancer Maternal Aunt        unk type   Cancer Maternal Uncle        unk type   Skin cancer Paternal Grandmother    Breast cancer Maternal Cousin        dx 40s   Colon cancer Neg Hx    Endometrial cancer Neg Hx    Pancreatic cancer Neg Hx    Prostate cancer Neg Hx       Jessica [name] has 1 son, 6 and two daughters, 41 and 73. She has 1 sister and 3 brothers. Her sister had ovarian cancer and melanoma at 55, did not have genetic testing.    Jessica [name]'s mother had breast cancer at 6. A maternal aunt and uncle both had cancer, unknown types. Maternal first cousin's daughter had breast cancer recently in her late 16s.   Jessica [name]'s father had lung cancer and passed at 68, history of smoking. Possible skin cancers in paternal aunts/uncles. Paternal grandmother had skin cancer.    Jessica [name] is unaware of previous family history of genetic testing for hereditary cancer risks. There is no reported Ashkenazi Jewish ancestry. There is no known consanguinity.       GENETIC TEST RESULTS:  The [facility] Common Hereditary Cancers+RNA Panel found no pathogenic mutations.   The Common Hereditary Cancers Panel + RNA offered by [facility] includes sequencing and/or deletion duplication testing of the following  48 genes: APC*, ATM*, AXIN2, BAP1, BARD1, BMPR1A, BRCA1, BRCA2, BRIP1, CDH1, CDK4, CDKN2A (p14ARF), CDKN2A (p16INK4a), CHEK2, CTNNA1, DICER1*, EPCAM*, FH*, GREM1*, HOXB13, KIT, MBD4, MEN1*, MLH1*, MSH2*, MSH3*, MSH6*, MUTYH, NF1*, NTHL1, PALB2, PDGFRA, PMS2*, POLD1*, POLE, PTEN*, RAD51C, RAD51D, SDHA*, SDHB, SDHC*, SDHD, SMAD4, SMARCA4, STK11, TP53, TSC1*, TSC2, VHL.    The test report has been scanned into EPIC and is located under the Molecular Pathology section of the Results Review tab.  A portion of the result report is included below for reference. Genetic testing reported out on 01/10/2024     Genetic testing identified a variant of uncertain significance (VUS) in the RAD51D gene.  At this time, it is unknown if this variant is associated with an increased risk for cancer or if it is benign, but most uncertain variants are reclassified to benign. It should not be used to make medical management decisions. With time, we suspect the laboratory will determine the significance of this variant, if any. If the laboratory reclassifies this variant, we will attempt to contact Jessica [name] to discuss it further.   Even though a pathogenic variant was not identified, possible explanations for the cancer in the family may include: There may be no hereditary risk for cancer in the family. The cancers  in Jessica [name] and/or her family may be sporadic/familial or due to other genetic and environmental factors. There may be a gene mutation in one of these genes that current testing methods cannot detect but that chance is small. There could be another gene that has not yet been discovered, or that we have not yet tested, that is responsible for the cancer diagnoses in the family.  It is also possible there is a hereditary cause for the cancer in the family that Jessica [name] did not inherit.  Therefore, it is important to remain in touch with cancer genetics in the future so that we can continue to offer Ms.  [name] the most up to date genetic testing.   ADDITIONAL GENETIC TESTING:  We discussed with Jessica [name] that her genetic testing was fairly extensive.  If there are additional relevant genes identified to increase cancer risk that can be analyzed in the future, we would be happy to discuss and coordinate this testing at that time.    CANCER SCREENING RECOMMENDATIONS:  Jessica [name] test result is considered negative (normal).  This means that we have not identified a hereditary cause for her personal and family history of cancer at this time.   An individual's cancer risk and medical management are not determined by genetic test results alone. Overall cancer risk assessment incorporates additional factors, including personal medical history, family history, and any available genetic information that may result in a personalized plan for cancer prevention and surveillance. Therefore, it is recommended she continue to follow the cancer management and screening guidelines provided by her oncology and primary healthcare provider.  RECOMMENDATIONS FOR FAMILY MEMBERS:   Since she did not inherit a identifiable mutation in a cancer predisposition gene included on this panel, her children could not have inherited a known mutation from her in one of these genes. Individuals in this family might be at some increased risk of developing cancer, over the general population risk, due to the family history of cancer.  Individuals in the family should notify their providers of the family history of cancer. We recommend women in this family have a yearly mammogram beginning at age 68, or 80 years younger than the earliest onset of cancer, an annual clinical breast exam, and perform monthly breast self-exams.  Family members should have colonoscopies by at age 15, or earlier, as recommended by their providers. Other members of the family may still carry a pathogenic variant in one of these genes that Jessica [name]  did not inherit. Based on the family history, we recommend her sister who had ovarian cancer and cousin who had breast cancer have genetic counseling and testing. Jessica [name] will let us  know if we can be of any assistance in coordinating genetic counseling and/or testing for this family member.   We do not recommend familial testing for the RAD51D variant of uncertain significance (VUS).  FOLLOW-UP:  Lastly, we discussed with Jessica [name] that cancer genetics is a rapidly advancing field and it is possible that new genetic tests will be appropriate for her and/or her family members in the future. We encouraged her to remain in contact with cancer genetics on an annual basis so we can update her personal and family histories and let her know of advances in cancer genetics that may benefit this family.   Our contact number was provided. Jessica [name] questions were answered to her satisfaction, and she knows she is welcome to call us  at anytime with additional questions or concerns.  Dena Cary, MS, [facility] Genetic Counselor [email or web address] Phone: [phone]

## 2024-01-18 ENCOUNTER — Encounter: Payer: Self-pay | Admitting: *Deleted

## 2024-01-18 ENCOUNTER — Other Ambulatory Visit: Payer: Self-pay | Admitting: Oncology

## 2024-01-18 ENCOUNTER — Other Ambulatory Visit: Payer: Self-pay

## 2024-01-18 DIAGNOSIS — Z95828 Presence of other vascular implants and grafts: Secondary | ICD-10-CM

## 2024-01-18 DIAGNOSIS — C541 Malignant neoplasm of endometrium: Secondary | ICD-10-CM

## 2024-01-19 ENCOUNTER — Other Ambulatory Visit (HOSPITAL_COMMUNITY): Payer: Self-pay

## 2024-01-19 ENCOUNTER — Other Ambulatory Visit: Payer: Self-pay

## 2024-01-22 ENCOUNTER — Other Ambulatory Visit (HOSPITAL_COMMUNITY): Payer: Self-pay

## 2024-01-22 ENCOUNTER — Other Ambulatory Visit: Payer: Self-pay

## 2024-01-22 NOTE — Progress Notes (Signed)
 Specialty Pharmacy Refill Coordination Note  Jessica [name] is a 64 y.o. female contacted today regarding refills of specialty medication(s) Lenvatinib  Mesylate (LENVIMA )   Patient requested Delivery   Delivery date: 01/26/24   Verified address: [address] [place] [address]   Medication will be filled on: 01/25/24

## 2024-01-22 NOTE — Progress Notes (Signed)
 Specialty Pharmacy Ongoing Clinical Assessment Note  Jessica [name] is a 64 y.o. female who is being followed by the specialty pharmacy service for RxSp Oncology   Patient's specialty medication(s) reviewed today: Lenvatinib  Mesylate (LENVIMA )   Missed doses in the last 4 weeks: 0   Patient/Caregiver did not have any additional questions or concerns.   Therapeutic benefit summary: Unable to assess   Adverse events/side effects summary: Experienced adverse events/side effects (Fatigue and itchy skin - both tolerable. Also having diarrhea, but this was an issue prior to therapy and seeing GI next week)   Patient's therapy is appropriate to: Continue    Goals Addressed             This Visit's Progress    Slow Disease Progression       Patient is unable to be assessed as therapy was recently initiated. Patient will maintain adherence         Follow up: 3 months  [facility] Specialty Pharmacist

## 2024-01-25 ENCOUNTER — Inpatient Hospital Stay (HOSPITAL_BASED_OUTPATIENT_CLINIC_OR_DEPARTMENT_OTHER): Admitting: Oncology

## 2024-01-25 ENCOUNTER — Other Ambulatory Visit: Payer: Self-pay

## 2024-01-25 ENCOUNTER — Encounter: Payer: Self-pay | Admitting: Oncology

## 2024-01-25 ENCOUNTER — Inpatient Hospital Stay

## 2024-01-25 ENCOUNTER — Inpatient Hospital Stay: Attending: Hematology

## 2024-01-25 VITALS — BP 145/79 | HR 68 | Temp 97.4°F | Resp 18

## 2024-01-25 DIAGNOSIS — G620 Drug-induced polyneuropathy: Secondary | ICD-10-CM

## 2024-01-25 DIAGNOSIS — C541 Malignant neoplasm of endometrium: Secondary | ICD-10-CM

## 2024-01-25 DIAGNOSIS — R197 Diarrhea, unspecified: Secondary | ICD-10-CM | POA: Diagnosis not present

## 2024-01-25 DIAGNOSIS — C786 Secondary malignant neoplasm of retroperitoneum and peritoneum: Secondary | ICD-10-CM | POA: Insufficient documentation

## 2024-01-25 DIAGNOSIS — Z8542 Personal history of malignant neoplasm of other parts of uterus: Secondary | ICD-10-CM | POA: Diagnosis not present

## 2024-01-25 DIAGNOSIS — C775 Secondary and unspecified malignant neoplasm of intrapelvic lymph nodes: Secondary | ICD-10-CM | POA: Diagnosis present

## 2024-01-25 DIAGNOSIS — M6281 Muscle weakness (generalized): Secondary | ICD-10-CM

## 2024-01-25 DIAGNOSIS — G629 Polyneuropathy, unspecified: Secondary | ICD-10-CM | POA: Diagnosis not present

## 2024-01-25 DIAGNOSIS — R112 Nausea with vomiting, unspecified: Secondary | ICD-10-CM

## 2024-01-25 DIAGNOSIS — R110 Nausea: Secondary | ICD-10-CM

## 2024-01-25 DIAGNOSIS — R748 Abnormal levels of other serum enzymes: Secondary | ICD-10-CM | POA: Insufficient documentation

## 2024-01-25 DIAGNOSIS — Z5112 Encounter for antineoplastic immunotherapy: Secondary | ICD-10-CM | POA: Diagnosis present

## 2024-01-25 DIAGNOSIS — Z95828 Presence of other vascular implants and grafts: Secondary | ICD-10-CM

## 2024-01-25 DIAGNOSIS — Z79899 Other long term (current) drug therapy: Secondary | ICD-10-CM | POA: Diagnosis not present

## 2024-01-25 DIAGNOSIS — T451X5A Adverse effect of antineoplastic and immunosuppressive drugs, initial encounter: Secondary | ICD-10-CM | POA: Diagnosis not present

## 2024-01-25 LAB — CBC WITH DIFFERENTIAL/PLATELET
Abs Immature Granulocytes: 0.01 K/uL (ref 0.00–0.07)
Basophils Absolute: 0.1 K/uL (ref 0.0–0.1)
Basophils Relative: 1 %
Eosinophils Absolute: 0.3 K/uL (ref 0.0–0.5)
Eosinophils Relative: 4 %
HCT: 41.9 % (ref 36.0–46.0)
Hemoglobin: 13.7 g/dL (ref 12.0–15.0)
Immature Granulocytes: 0 %
Lymphocytes Relative: 30 %
Lymphs Abs: 1.9 K/uL (ref 0.7–4.0)
MCH: 27.5 pg (ref 26.0–34.0)
MCHC: 32.7 g/dL (ref 30.0–36.0)
MCV: 84 fL (ref 80.0–100.0)
Monocytes Absolute: 0.8 K/uL (ref 0.1–1.0)
Monocytes Relative: 12 %
Neutro Abs: 3.2 K/uL (ref 1.7–7.7)
Neutrophils Relative %: 53 %
Platelets: 210 K/uL (ref 150–400)
RBC: 4.99 MIL/uL (ref 3.87–5.11)
RDW: 13.4 % (ref 11.5–15.5)
WBC: 6.1 K/uL (ref 4.0–10.5)
nRBC: 0 % (ref 0.0–0.2)

## 2024-01-25 LAB — COMPREHENSIVE METABOLIC PANEL WITH GFR
ALT: 54 U/L — ABNORMAL HIGH (ref 0–44)
AST: 42 U/L — ABNORMAL HIGH (ref 15–41)
Albumin: 3.9 g/dL (ref 3.5–5.0)
Alkaline Phosphatase: 156 U/L — ABNORMAL HIGH (ref 38–126)
Anion gap: 14 (ref 5–15)
BUN: 12 mg/dL (ref 8–23)
CO2: 23 mmol/L (ref 22–32)
Calcium: 9.2 mg/dL (ref 8.9–10.3)
Chloride: 105 mmol/L (ref 98–111)
Creatinine, Ser: 0.53 mg/dL (ref 0.44–1.00)
GFR, Estimated: >60 mL/min
Glucose, Bld: 132 mg/dL — ABNORMAL HIGH (ref 70–99)
Potassium: 3.9 mmol/L (ref 3.5–5.1)
Sodium: 141 mmol/L (ref 135–145)
Total Bilirubin: 0.3 mg/dL (ref 0.0–1.2)
Total Protein: 6.7 g/dL (ref 6.5–8.1)

## 2024-01-25 LAB — CK: Total CK: 59 U/L (ref 38–234)

## 2024-01-25 LAB — MAGNESIUM: Magnesium: 1.8 mg/dL (ref 1.7–2.4)

## 2024-01-25 MED ORDER — SODIUM CHLORIDE 0.9 % IV SOLN: INTRAVENOUS | Status: DC

## 2024-01-25 MED ORDER — SODIUM CHLORIDE 0.9 % IV SOLN
200.0000 mg | Freq: Once | INTRAVENOUS | Status: AC
  Administered 2024-01-25: 200 mg via INTRAVENOUS
  Filled 2024-01-25: qty 8

## 2024-01-25 MED ORDER — LENVATINIB (10 MG DAILY DOSE) 10 MG PO CPPK
10.0000 mg | ORAL_CAPSULE | Freq: Every day | ORAL | 2 refills | Status: AC
  Filled 2024-01-25: qty 30, 30d supply, fill #0
  Filled 2024-02-17: qty 30, 30d supply, fill #1

## 2024-01-25 NOTE — Patient Instructions (Signed)
 [facility] [place] - A DEPT OF [facility]  Discharge Instructions: Thank you for choosing [facility] to provide your oncology and hematology care.  If you have a lab appointment with the Cancer Center - please note that after April 8th, 2024, all labs will be drawn in the cancer center.  You do not have to check in or register with the main entrance as you have in the past but will complete your check-in in the cancer center.  Wear comfortable clothing and clothing appropriate for easy access to any Portacath or PICC line.   We strive to give you quality time with your provider. You may need to reschedule your appointment if you arrive late (15 or more minutes).  Arriving late affects you and other patients whose appointments are after yours.  Also, if you miss three or more appointments without notifying the office, you may be dismissed from the clinic at the provider's discretion.      For prescription refill requests, have your pharmacy contact our office and allow 72 hours for refills to be completed.    Today you received the following chemotherapy and/or immunotherapy agents Keytruda      To help prevent nausea and vomiting after your treatment, we encourage you to take your nausea medication as directed.  BELOW ARE SYMPTOMS THAT SHOULD BE REPORTED IMMEDIATELY: *FEVER GREATER THAN 100.4 F (38 C) OR HIGHER *CHILLS OR SWEATING *NAUSEA AND VOMITING THAT IS NOT CONTROLLED WITH YOUR NAUSEA MEDICATION *UNUSUAL SHORTNESS OF BREATH *UNUSUAL BRUISING OR BLEEDING *URINARY PROBLEMS (pain or burning when urinating, or frequent urination) *BOWEL PROBLEMS (unusual diarrhea, constipation, pain near the anus) TENDERNESS IN MOUTH AND THROAT WITH OR WITHOUT PRESENCE OF ULCERS (sore throat, sores in mouth, or a toothache) UNUSUAL RASH, SWELLING OR PAIN  UNUSUAL VAGINAL DISCHARGE OR ITCHING   Items with * indicate a potential emergency and should be followed up  as soon as possible or go to the Emergency Department if any problems should occur.  Please show the CHEMOTHERAPY ALERT CARD or IMMUNOTHERAPY ALERT CARD at check-in to the Emergency Department and triage nurse.  Should you have questions after your visit or need to cancel or reschedule your appointment, please contact [facility]  - A DEPT OF [facility] [phone]  and follow the prompts.  Office hours are 8:00 a.m. to 4:30 p.m. Monday - Friday. Please note that voicemails left after 4:00 p.m. may not be returned until the following business day.  We are closed weekends and major holidays. You have access to a nurse at all times for urgent questions. Please call the main number to the clinic [phone] and follow the prompts.  For any non-urgent questions, you may also contact your provider using MyChart. We now offer e-Visits for anyone 30 and older to request care online for non-urgent symptoms. For details visit [email or web address].   Also download the MyChart app! Go to the app store, search "MyChart", open the app, select [place], and log in with your MyChart username and password.

## 2024-01-25 NOTE — Progress Notes (Signed)
 " Patient Care Team: [name], FNP as PCP - General (Family Medicine) Stacia [name], MD as PCP - Cardiology (Cardiology) Wilson, [name], RN as Oncology Nurse Navigator  Clinic Day:  01/25/2024  Referring physician: [name], FNP   CHIEF COMPLAINT:  CC: Recurrent endometrial carcinosarcoma    ASSESSMENT & PLAN:   Assessment & Plan: KRYSTALL KRUCKENBERG  is a 64 y.o. 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 Reports increased diarrhea, nausea and vomiting. - Discussed dse reducing lenvima  to 10mg  daily for better tolerance. Patient is agreeable.  - Labs reviewed today: CBC: WNL, CMP: Normal creatinine, slightly elevated LFTs.  -Reviewed EKG: QTC: 391 -Physical exam stable today. Will proceed with treatment today.  - Has seen Dr. Viktoria with GYN/ONC and Dr. Dannielle with Rad/Onc.  - Will repeat PET scan in 3 months. I.e 02/2024.   Return to clinic prior to next cycle of pembrolizumab  to assess for tolerance.  Peripheral neuropathy Peripheral neuropathy likely related to chemotherapy, with symptoms of numbness and tingling in the toes.  - Continue Lyrica  75 mg twice daily for peripheral neuropathy.   Chronic diarrhea Chronic diarrhea prior to starting treatment with lenvima  and pembrolizumab  with recent worsening Referred to GI- Appt this week.  - Advise Imodium after each loose bowel movement, up to five times daily. - Monitor for  infection signs or complications.   Adverse effects of antineoplastic therapy Persistent fatigue, myalgias, muscle weakness, nausea, vomiting, and diarrhea due to antineoplastic therapy. Dose reduction expected to improve tolerability without compromising efficacy.  - Reduced Lenvatinib  dose to 10 mg. - Ordered creatine kinase to evaluate for medication-induced myopathy. - Advised antiemetics (Compazine , Zofran ) as needed, up to four times daily. - Reinforced loperamide use for diarrhea, up to five times daily; report if episodes exceed five per day.  Elevated liver enzymes Mild elevation likely due to antineoplastic therapy. Normal CBC.  - Reassess liver enzymes at next visit.   The patient understands the plans discussed today and is in agreement with them.  She knows to contact our office if she develops concerns prior to her next appointment.  The total time spent in the appointment was 25 minutes for the encounter with patient, including review of chart and various tests results, discussions about plan of care and coordination of care plan  Mickiel Dry, MD  [facility] [place] - A DEPT OF [name] [address] [place] [address] Dept: [phone] Dept Fax: [phone]   Orders Placed This Encounter  Procedures   CK     ONCOLOGY HISTORY:   I have reviewed her chart and materials related to her cancer extensively and collaborated history with the patient. Summary of oncologic history is as follows:   Diagnosis: Recurrent endometrial carcinosarcoma   -03/29/2018: Cervical biopsy at 5 o'clock, 7 o'clock, and 12 o'clock.  -Pathology: Carcinosarcoma (malignant mixed mullerian tumor). Much of the carcinomatous component has clear cell features. -04/13/2018: TAH, BSO, bilateral pelvic and para-aortic lymph node resections.  Pathology: High-grade  carcinosarcoma (malignant mixed mullerian tumor) arising in an  endometrial polyp with no myometrial or serosal invasion identified. 0/39 lymph nodes positive. pT1a pN0, FIGO stage Ia. MMR preserved. MSI-stable.  -12/09/2018 CT AP: Fairly extensive aggressive and new abdominal/pelvic carcinomatosis consistent with recurrent endometrial cancer. Associated small volume abdominal/pelvic ascites. No findings for bowel involvement or bowel obstruction. -12/22/2018 CA 125 elevated at 127.0 -12/28/2018 Omental mass biopsy. Pathology: Poorly differentiated carcinoma.  -Comment: Immunohistochemistry is positive for PAX 8 and ER (weak). CK7, CK20,  TTF-1, CDX-2 and GATA-3 are negative. -12/29/2018-10/05/2019: Carboplatin  and paclitaxel . Patient developed severe allergic reaction during cycle 14 with carboplatin  and was discontinued -05/02/2019: CT CAP:  Interval response to therapy as evidenced by decrease in size of peritoneal metastases. -07/04/2019: CT AP: Continued positive response to therapy. Scattered peritoneal metastases in the pelvis and left omentum are decreased. No new or progressive metastatic disease in the abdomen or pelvis. -10/24/2019: CT AP: Substantial reduction in size of soft tissue nodules in the pelvis compatible with positive response to therapy. Faint stranding in the left omentum at site of prior nodularity. -11/09/2019-08/22/2021: Single agent paclitaxel  started,01/18/2020 paclitaxel  dose reduced by 20%,  treatment held on 08/22/2021 for chemo holiday -01/10/2020-09/30/2022: CT AP: Stable disease - 04/06/2023:CT AP: New defined 2.2 cm soft tissue density along the superior and right lateral margin of the vaginal cuff, suspicious for local recurrence. Increased size of 11 mm lymph node or peritoneal nodule adjacent to the left external iliac vessels, consistent with metastatic disease. Stable 3 mm nodule or lymph node in sigmoid mesocolon. -04/20/2023: [id number]: MPL [id number]. No other targetable mutations.  -05/18/2023: Left external iliac lymph node biopsy.   -Pathology: Poorly differentiated malignant neoplasm with dual cell population and areas of squamous differentiation.  -Comment: The epithelioid component shows squamous differentiation and shows labeling for cytokeratin AE1/AE3, p63 and CK5/6.  The more spindled component shows focal labeling for p63 but is negative for cytokeratin AE1/AE3 and CK5/6.  Immunostain for PAX8 shows only weak nonspecific labeling in both components.  -05/18/2023: [id number]: Not enough tissue. HER2 by IHC (1+).  -08/11/2023: CT AP: Increased size of the left external iliac lymph node now measuring 16 mm in short axis previously 11 mm, concerning for progressive nodal disease. Similar soft tissue nodularity along the right vaginal cuff measuring 2.1 cm previously 2.2 cm. -08/18/2023- 10/20/2023: Paclitaxel  140 mg/m every 3 weeks (restarted).  Discontinued for progression. - 09/02/2023: CT CAP: Continued interval enlargement of a left iliac lymph node or soft tissue nodule measuring 3.1 x 3.0 cm, previously 1.8 x 1.6 cm, consistent with worsened metastatic disease. Previously described soft tissue at the right aspect of the vaginal cuff is obscured on today's examination by dense metallic streak artifact from adjacent arthroplasty. New, very tiny nodules in the left lung base measuring up to 0.2 cm, nonspecific and most likely infectious or inflammatory but warranting close attention on follow-up in the setting of established metastatic disease. -11/17/2023: Guardant 360: TMB: high 8.3 mut/Mb, NF1 splice site SNV. TP53.  -12/11/205 - Current: Lenvatinib  14mg  daily and pembrolizumab  200mg  every 3 weeks  -01/25/2024: Dose reduced lenvima  to 10mg  daily (better tolerance) -12/31/2023: Invitae panel: Negative. Heterozygous RAD51D- VUS  Current Treatment:  Lenvatinib  and Pembrolizumab   INTERVAL HISTORY:   Discussed the use of AI scribe software for clinical note transcription with the patient, who gave verbal consent to  proceed.  History of Present Illness Derricka Mertz is a 64 year old female undergoing oral antineoplastic therapy who presents for follow-up and  keytruda  infusion today.   She has persistent and severe fatigue, with inability to remain active at work and requiring frequent rest. She also experiences generalized myalgias and pronounced muscle weakness in her arms and shoulders, resulting in difficulty performing routine tasks such as opening a water  bottle.  Gastrointestinal symptoms include daily nausea requiring Compazine  at least twice per day, two episodes of severe vomiting in the past week, and frequent diarrhea occurring up to five times daily, managed with Imodium, but not after each episode. She also reports urinary incontinence associated with episodes of diarrhea. She is scheduled for gastroenterology evaluation later this week.  She expresses concern regarding the impact of these symptoms on her quality of life.  Recent laboratory studies showed mildly elevated liver enzymes, with otherwise normal CBC and EKG. She denies acute hepatic symptoms.   I have reviewed the past medical history, past surgical history, social history and family history with the patient and they are unchanged from previous note.  ALLERGIES:  is allergic to carboplatin  and nickel.  MEDICATIONS:  Current Outpatient Medications  Medication Sig Dispense Refill   amLODipine  (NORVASC ) 5 MG tablet Take 1 tablet by mouth once daily 90 tablet 0   atorvastatin  (LIPITOR) 20 MG tablet Take 1 tablet by mouth once daily 90 tablet 0   Cholecalciferol  (VITAMIN D3) 25 MCG (1000 UT) CAPS Take 1 capsule (1,000 Units total) by mouth daily. 60 capsule 2   lenvatinib  10 mg daily dose (LENVIMA ) capsule Take 1 capsule (10 mg total) by mouth daily. 30 capsule 2   lidocaine -prilocaine  (EMLA ) cream Apply to affected area once as needed as directed. 30 g 3   MAGnesium -Oxide 400 (240 Mg) MG tablet Take 1 tablet (400 mg total)  by mouth 2 (two) times daily. 60 tablet 3   meclizine  (ANTIVERT ) 25 MG tablet Take 1 tablet (25 mg total) by mouth 3 (three) times daily as needed for dizziness. 30 tablet 3   methocarbamol  (ROBAXIN ) 500 MG tablet Take 1 tablet (500 mg total) by mouth every 6 (six) hours as needed for muscle spasms. 40 tablet 1   nitroGLYCERIN  (NITROSTAT ) 0.4 MG SL tablet Place 1 tablet (0.4 mg total) under the tongue every 5 (five) minutes as needed for chest pain. 25 tablet 3   oxyCODONE  (OXY IR/ROXICODONE ) 5 MG immediate release tablet Take 1 tablet (5 mg total) by mouth 2 (two) times daily as needed for severe pain (pain score 7-10). 60 tablet 0   pregabalin  (LYRICA ) 75 MG capsule Take 1 capsule (75 mg total) by mouth 3 (three) times daily. 90 capsule 3   prochlorperazine  (COMPAZINE ) 10 MG tablet Take 1 tablet (10 mg total) by mouth every 6 (six) hours as needed for nausea or vomiting. 30 tablet 1   sertraline  (ZOLOFT ) 100 MG tablet Take 1 tablet by mouth once daily 30 tablet 0   simethicone  (MYLICON) 80 MG chewable tablet Chew 1 tablet (80 mg total) by mouth every 6 (six) hours as needed for flatulence. 30 tablet 0   No current facility-administered medications for this visit.    VITALS:  There were no vitals taken for this visit.  Wt Readings from Last 3 Encounters:  01/25/24 226 lb 10.1 oz (102.8 kg)  01/05/24 230 lb (104.3 kg)  12/31/23 231 lb 6.4 oz (105 kg)    There is no height or weight on file to calculate BMI.  Performance status (ECOG): 2 - Symptomatic, <50% confined to bed  PHYSICAL EXAM:   GENERAL:alert, no distress and  comfortable SKIN: skin color, texture, turgor are normal, no rashes or significant lesions LYMPH:  no palpable lymphadenopathy in the cervical, axillary or inguinal LUNGS: clear to auscultation and percussion with normal breathing effort HEART: regular rate & rhythm and no murmurs and no lower extremity edema ABDOMEN:abdomen soft, non-tender and normal bowel  sounds Musculoskeletal:no cyanosis of digits and no clubbing  NEURO: alert & oriented x 3 with fluent speech  LABORATORY DATA:  I have reviewed the data as listed    Component Value Date/Time   NA 141 01/25/2024 0800   NA 145 (H) 07/07/2023 1027   K 3.9 01/25/2024 0800   CL 105 01/25/2024 0800   CO2 23 01/25/2024 0800   GLUCOSE 132 (H) 01/25/2024 0800   BUN 12 01/25/2024 0800   BUN 14 07/07/2023 1027   CREATININE 0.53 01/25/2024 0800   CALCIUM  9.2 01/25/2024 0800   PROT 6.7 01/25/2024 0800   PROT 7.0 07/07/2023 1027   ALBUMIN 3.9 01/25/2024 0800   ALBUMIN 4.4 07/07/2023 1027   AST 42 (H) 01/25/2024 0800   ALT 54 (H) 01/25/2024 0800   ALKPHOS 156 (H) 01/25/2024 0800   BILITOT 0.3 01/25/2024 0800   BILITOT 0.3 07/07/2023 1027   GFRNONAA >60 01/25/2024 0800   GFRAA >60 10/05/2019 0825    Lab Results  Component Value Date   WBC 6.1 01/25/2024   NEUTROABS 3.2 01/25/2024   HGB 13.7 01/25/2024   HCT 41.9 01/25/2024   MCV 84.0 01/25/2024   PLT 210 01/25/2024     RADIOGRAPHIC STUDIES: I have personally reviewed the radiological images as listed and agreed with the findings in the report.  "

## 2024-01-25 NOTE — Progress Notes (Signed)
 Patient presents today for Keytruda  infusion per providers order.  Vital signs and labs wihtin parameters for treatment.  Patient is currently taking a chemo pill at home (Lenvatinib ).  Patient is having multiple side effects to this pill including N/V/D, aching all over and sparse red pimples to the face neck and chest.  MD notified.  Obtained an EKG per MD order.  Per Dr. Davonna okay to proceed with Keytruda  today.  Treatment given today per MD orders.  Stable during infusion without adverse affects.  Vital signs stable.  No complaints at this time.  Discharge from clinic ambulatory in stable condition.  Alert and oriented X 3.  Follow up with [facility] as scheduled.

## 2024-01-25 NOTE — Patient Instructions (Addendum)
 [facility] at [facility] Discharge Instructions   You were seen and examined today by Dr. Davonna.  She reviewed the results of your lab work which are normal/stable.   Continue Lenvima  at a reduced dose of 10 mg daily.   We will proceed with your Keytruda  today.   Return as scheduled.    Thank you for choosing [facility] at [facility] to provide your oncology and hematology care.  To afford each patient quality time with our provider, please arrive at least 15 minutes before your scheduled appointment time.   If you have a lab appointment with the Cancer Center please come in thru the Main Entrance and check in at the main information desk.  You need to re-schedule your appointment should you arrive 10 or more minutes late.  We strive to give you quality time with our providers, and arriving late affects you and other patients whose appointments are after yours.  Also, if you no show three or more times for appointments you may be dismissed from the clinic at the providers discretion.     Again, thank you for choosing [facility]  Our hope is that these requests will decrease the amount of time that you wait before being seen by our physicians.       _____________________________________________________________  Should you have questions after your visit to [facility], please contact our office at [phone] and follow the prompts.  Our office hours are 8:00 a.m. and 4:30 p.m. Monday - Friday.  Please note that voicemails left after 4:00 p.m. may not be returned until the following business day.  We are closed weekends and major holidays.  You do have access to a nurse 24-7, just call the main number to the clinic [phone] and do not press any options, hold on the line and a nurse will answer the phone.    For prescription refill requests, have your pharmacy contact our office and allow 72 hours.    Due to  Covid, you will need to wear a mask upon entering the hospital. If you do not have a mask, a mask will be given to you at the Main Entrance upon arrival. For doctor visits, patients may have 1 support person age 54 or older with them. For treatment visits, patients can not have anyone with them due to social distancing guidelines and our immunocompromised population.

## 2024-01-26 ENCOUNTER — Encounter: Payer: Self-pay | Admitting: Oncology

## 2024-01-27 ENCOUNTER — Telehealth (INDEPENDENT_AMBULATORY_CARE_PROVIDER_SITE_OTHER): Payer: Self-pay

## 2024-01-27 ENCOUNTER — Encounter (INDEPENDENT_AMBULATORY_CARE_PROVIDER_SITE_OTHER): Payer: Self-pay | Admitting: Gastroenterology

## 2024-01-27 ENCOUNTER — Ambulatory Visit (INDEPENDENT_AMBULATORY_CARE_PROVIDER_SITE_OTHER): Admitting: Gastroenterology

## 2024-01-27 VITALS — BP 105/73 | HR 88 | Temp 97.0°F | Ht 64.0 in | Wt 222.1 lb

## 2024-01-27 DIAGNOSIS — R197 Diarrhea, unspecified: Secondary | ICD-10-CM

## 2024-01-27 DIAGNOSIS — R748 Abnormal levels of other serum enzymes: Secondary | ICD-10-CM | POA: Diagnosis not present

## 2024-01-27 DIAGNOSIS — K529 Noninfective gastroenteritis and colitis, unspecified: Secondary | ICD-10-CM | POA: Insufficient documentation

## 2024-01-27 DIAGNOSIS — K8020 Calculus of gallbladder without cholecystitis without obstruction: Secondary | ICD-10-CM

## 2024-01-27 DIAGNOSIS — N3946 Mixed incontinence: Secondary | ICD-10-CM

## 2024-01-27 MED ORDER — PSYLLIUM 58.6 % PO PACK: 1.0000 | PACK | Freq: Two times a day (BID) | ORAL | 2 refills | Status: AC

## 2024-01-27 MED ORDER — PEG 3350-KCL-NA BICARB-NACL 420 G PO SOLR: 4000.0000 mL | Freq: Once | ORAL | 0 refills | Status: AC

## 2024-01-27 MED ORDER — POLYETHYLENE GLYCOL 3350 17 G PO PACK: 17.0000 g | PACK | Freq: Two times a day (BID) | ORAL | 0 refills | Status: AC

## 2024-01-27 NOTE — Telephone Encounter (Signed)
 Spoke with patient, scheduled ultrasound for 02/04/2024 at 9:30am (NPO midnight). Patient aware and verbalized understanding.

## 2024-01-27 NOTE — Patient Instructions (Signed)
 It was very nice to meet you today, as dicussed with will plan for the following :  1) Nulytely  4 Liters for colon purge  2)Ensure adequate fluid intake: Aim for 8 glasses of water  daily. Follow a high fiber diet: Include foods such as dates, prunes, pears, and kiwi. Use Metamucil twice a day.  3) Labs and ultrasound  4) Stool samples

## 2024-01-27 NOTE — Addendum Note (Signed)
 Addended by: CINDERELLA DEATRICE SMILES on: 01/27/2024 09:30 AM   Modules accepted: Level of Service

## 2024-01-27 NOTE — Progress Notes (Signed)
 Keshawn Fiorito Faizan Keontae Levingston , M.D. Gastroenterology & Hepatology [facility] Gastroenterology [address] [place], [place] [address] Primary Care Physician: [name], FNP [address] [place] [address]  Chief Complaint: Chronic diarrhea and elevated liver enzymes  History of Present Illness: Jessica [name] is a 64 y.o. 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  Patient has been taking Imodium after each loose bowel movement up to 5 times daily.  Patient reports urinary incontinence along with fecal incontinence. Patient reports usually she will have 4-5 bowel movements daily  Liquid consistency but occasionally can be hard and patient feels constipated.  Denies any blood in stool.The patient denies having any nausea, vomiting, fever, chills, hematochezia, melena, hematemesis, abdominal distention, abdominal pain,  jaundice, pruritus or weight loss.  Patient denies using any new added medications, herbal supplementation  Last ZHI:wnwz Last Colonoscopy:none  Cologuard test negative 2023  Labs from 01/2024 elevated liver enzymes ALT 54 AST 42 alk phos 156 T. bili 0.3 hemoglobin 13.7 platelet 210 TSH 0.33  Past Medical History: Past Medical History:  Diagnosis Date   Allergy    Anemia    Anxiety    Arthritis    Cancer (HCC)    Phreesia 09/05/2019   Cancer of skin of back    Chest pain    Depression    Elevated blood pressure reading    Endometrial cancer (HCC)    Hyperlipidemia    Hypertension    PMB (postmenopausal bleeding)    Port-A-Cath in place 12/24/2018   Pre-diabetes     Past Surgical History: Past Surgical History:  Procedure Laterality Date   IR IMAGING GUIDED PORT INSERTION  12/28/2018   IR RADIOLOGIST  EVAL & MGMT  06/02/2018   IR RADIOLOGIST EVAL & MGMT  06/16/2018   NECK SURGERY  2000   spinal surgery , titanium rod in place    ROBOTIC ASSISTED TOTAL HYSTERECTOMY WITH BILATERAL SALPINGO OOPHERECTOMY N/A 04/13/2018   Procedure: XI ROBOTIC ASSISTED TOTAL HYSTERECTOMY WITH BILATERAL SALPINGO OOPHORECTOMY;  Surgeon: Eloy Herring, MD;  Location: [facility];  Service: Gynecology;  Laterality: N/A;   ROBOTIC PELVIC AND PARA-AORTIC LYMPH NODE DISSECTION N/A 04/13/2018   Procedure: XI ROBOTIC PELVIC LYMPHADECTOMY AND PARA-AORTIC LYMPH NODE DISSECTION;  Surgeon: Eloy Herring, MD;  Location: [facility];  Service: Gynecology;  Laterality: N/A;   TOTAL HIP ARTHROPLASTY Right 09/19/2022   Procedure: RIGHT TOTAL HIP ARTHROPLASTY ANTERIOR APPROACH;  Surgeon: Vernetta Lonni GRADE, MD;  Location: [facility];  Service: Orthopedics;  Laterality: Right;    Family History: Family History  Problem Relation Age of Onset   Hypertension Mother    Breast cancer Mother 14   Hypertension Father    Heart disease Father    Lung cancer Father    Ovarian cancer Sister 67   Melanoma Sister 64   Cancer Maternal Aunt        unk type   Cancer Maternal Uncle        unk type   Skin cancer Paternal Grandmother    Breast cancer Maternal Cousin        dx 66s   Colon cancer Neg Hx    Endometrial cancer Neg Hx    Pancreatic cancer Neg Hx    Prostate cancer Neg Hx     Social History:Tobacco  Use History[1] Social History   Substance and Sexual Activity  Alcohol Use Yes   Alcohol/week: 4.0 standard drinks of alcohol   Types: 4 Glasses of wine per week   Comment: weekends   Social History   Substance and Sexual Activity  Drug Use Never    Allergies: Allergies[2]  Medications: Current Outpatient Medications  Medication Sig Dispense Refill   amLODipine  (NORVASC ) 5 MG tablet Take 1 tablet by mouth once daily 90 tablet 0   atorvastatin  (LIPITOR) 20 MG tablet Take 1 tablet by mouth once daily 90 tablet 0    Cholecalciferol  (VITAMIN D3) 25 MCG (1000 UT) CAPS Take 1 capsule (1,000 Units total) by mouth daily. 60 capsule 2   lidocaine -prilocaine  (EMLA ) cream Apply to affected area once as needed as directed. 30 g 3   MAGnesium -Oxide 400 (240 Mg) MG tablet Take 1 tablet (400 mg total) by mouth 2 (two) times daily. 60 tablet 3   meclizine  (ANTIVERT ) 25 MG tablet Take 1 tablet (25 mg total) by mouth 3 (three) times daily as needed for dizziness. 30 tablet 3   methocarbamol  (ROBAXIN ) 500 MG tablet Take 1 tablet (500 mg total) by mouth every 6 (six) hours as needed for muscle spasms. 40 tablet 1   nitroGLYCERIN  (NITROSTAT ) 0.4 MG SL tablet Place 1 tablet (0.4 mg total) under the tongue every 5 (five) minutes as needed for chest pain. 25 tablet 3   oxyCODONE  (OXY IR/ROXICODONE ) 5 MG immediate release tablet Take 1 tablet (5 mg total) by mouth 2 (two) times daily as needed for severe pain (pain score 7-10). 60 tablet 0   pregabalin  (LYRICA ) 75 MG capsule Take 1 capsule (75 mg total) by mouth 3 (three) times daily. 90 capsule 3   prochlorperazine  (COMPAZINE ) 10 MG tablet Take 1 tablet (10 mg total) by mouth every 6 (six) hours as needed for nausea or vomiting. 30 tablet 1   sertraline  (ZOLOFT ) 100 MG tablet Take 1 tablet by mouth once daily 30 tablet 0   simethicone  (MYLICON) 80 MG chewable tablet Chew 1 tablet (80 mg total) by mouth every 6 (six) hours as needed for flatulence. 30 tablet 0   lenvatinib  10 mg daily dose (LENVIMA ) capsule Take 1 capsule (10 mg total) by mouth daily. 30 capsule 2   No current facility-administered medications for this visit.    Review of Systems: GENERAL: negative for malaise, night sweats HEENT: No changes in hearing or vision, no nose bleeds or other nasal problems. NECK: Negative for lumps, goiter, pain and significant neck swelling RESPIRATORY: Negative for cough, wheezing CARDIOVASCULAR: Negative for chest pain, leg swelling, palpitations, orthopnea GI: SEE  HPI MUSCULOSKELETAL: Negative for joint pain or swelling, back pain, and muscle pain. SKIN: Negative for lesions, rash HEMATOLOGY Negative for prolonged bleeding, bruising easily, and swollen nodes. ENDOCRINE: Negative for cold or heat intolerance, polyuria, polydipsia and goiter. NEURO: negative for tremor, gait imbalance, syncope and seizures. The remainder of the review of systems is noncontributory.   Physical Exam: BP 105/73   Pulse 88   Temp (!) 97 F (36.1 C)   Ht 5' 4 (1.626 m)   Wt 222 lb 1.6 oz (100.7 kg)   BMI 38.12 kg/m  GENERAL: The patient is AO x3, in no acute distress. HEENT: Head is normocephalic and atraumatic. EOMI are intact. Mouth is well hydrated and without lesions. NECK: Supple. No masses LUNGS: Clear to auscultation. No presence of rhonchi/wheezing/rales. Adequate chest expansion HEART: RRR, normal s1 and s2. ABDOMEN: Soft, nontender,  no guarding, no peritoneal signs, and nondistended. BS +. No masses.  Imaging/Labs: as above     Latest Ref Rng & Units 01/25/2024    8:00 AM 12/31/2023    7:54 AM 12/22/2023    3:01 PM  CBC  WBC 4.0 - 10.5 K/uL 6.1  6.6  9.1   Hemoglobin 12.0 - 15.0 g/dL 86.2  87.5  85.7   Hematocrit 36.0 - 46.0 % 41.9  38.0  44.0   Platelets 150 - 400 K/uL 210  262  306    No results found for: IRON, TIBC, FERRITIN  I personally reviewed and interpreted the available labs, imaging and endoscopic files.  CT 10/2023  IMPRESSION: 1. Continued interval enlargement of a left iliac lymph node or soft tissue nodule measuring 3.1 x 3.0 cm, previously 1.8 x 1.6 cm, consistent with worsened metastatic disease. 2. Previously described soft tissue at the right aspect of the vaginal cuff is obscured on today's examination by dense metallic streak artifact from adjacent arthroplasty. 3. New, very tiny nodules in the left lung base measuring up to 0.2 cm, nonspecific and most likely infectious or inflammatory but warranting close  attention on follow-up in the setting of established metastatic disease. 4. Cholelithiasis.  Impression and Plan:  Jessica [name] is a 64 y.o. 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 who presents for evaluation of Chronic diarrhea and elevated liver enzymes   #Chronic diarrhea  Patient has more than 3 loose stools for more than 4 weeks hence qualifies for chronic diarrhea  This could be secondary to infectious, secretory , osmotic ,functional ,malabsorptive or inflammatory diarrhea  On CT done 10/2023 I do see significant stool burden throughout and likely we are dealing with overflow diarrhea  Fecal incontinence could also be a sign of overflow diarrhea  There is no correlation with food intake and hence patient is recommended keeping a food diary  Patient diet is low in fiber and recommended Metamucil starting 1 scoop daily for 1 week followed by 2 scoops daily second week, and 3 scoops daily thereafter, to bulk up stool  We will obtain blood work including  CRP  fecal calprotectin, check for celiac disease with TTG IgA and total IgA, and obtain stool studies including ruling infectious diarrhea as patient is immunocompromised on antineoplastic therapy  Will give bowel purge with Nulytely  4 li and maintain patient on Metamucil twice daily thereafter  Recent PET/CT with possible invasion into colon of endometrial cancer  Since patient never had a colonoscopy had a discussion regarding endoscopic evaluation and patient want to revisit this in future appointment to see if her symptoms would improve with medication  Orders:   - GI Profile, Stool, PCR - Fecal leukocytes - Calprotectin, Fecal - C-reactive protein - C difficile Toxins A+B W/Rflx - Tissue Transglutaminase Abs,IgG,IgA - IgA  If above workup is negative , may need to proceed with EGD with small bowel biopsies and Colonoscopy with random colonic biopsies .  #Elevated liver  enzymes  Patient has mixed pattern elevation liver enzymes this could be DILI (drug-induced liver injury) No signs of liver failure as patient is alert and oriented x 3 without any asterixis on exam  Will obtain viral hepatitis profile and autoimmune serologies including abdominal ultrasound Continue to monitor liver enzymes for now  Orders Placed This Encounter  Procedures   Calprotectin, Fecal   C difficile Toxins A+B W/Rflx   US  Abdomen Complete   C-reactive protein   GI Profile, Stool,  PCR   IgA   Tissue Transglutaminase Abs,IgG,IgA   ANA   Protime-INR   Iron, TIBC and Ferritin Panel   Mitochondrial antibodies   Comprehensive metabolic panel with GFR   Hepatitis A antibody, total   Hepatitis B core antibody, total   Hepatitis B surface antibody,qualitative   Hepatitis B surface antigen   Hepatitis C antibody   HIV Antibody (routine testing w rflx)   Anti-smooth muscle antibody, IgG     All questions were answered.      Jessica Winiarski Faizan Andreus Cure, MD Gastroenterology and Hepatology [facility] Gastroenterology   This chart has been completed using [facility] Dictation software, and while attempts have been made to ensure accuracy , certain words and phrases may not be transcribed as intended      [1]  Social History Tobacco Use  Smoking Status Never  Smokeless Tobacco Never  [2]  Allergies Allergen Reactions   Carboplatin  Other (See Comments) and Anaphylaxis    body on fire and abdominal pain   Nickel Rash    itchy

## 2024-01-28 ENCOUNTER — Encounter: Payer: Self-pay | Admitting: Oncology

## 2024-01-28 LAB — C-REACTIVE PROTEIN: CRP: 8 mg/L (ref 0–10)

## 2024-01-29 ENCOUNTER — Other Ambulatory Visit: Payer: Self-pay

## 2024-01-29 ENCOUNTER — Encounter: Payer: Self-pay | Admitting: Oncology

## 2024-01-29 DIAGNOSIS — F321 Major depressive disorder, single episode, moderate: Secondary | ICD-10-CM

## 2024-01-29 DIAGNOSIS — F419 Anxiety disorder, unspecified: Secondary | ICD-10-CM

## 2024-01-29 LAB — IRON,TIBC AND FERRITIN PANEL
Ferritin: 88 ng/mL (ref 15–150)
Iron Saturation: 13 % — ABNORMAL LOW (ref 15–55)
Iron: 51 ug/dL (ref 27–139)
Total Iron Binding Capacity: 406 ug/dL (ref 250–450)
UIBC: 355 ug/dL (ref 118–369)

## 2024-01-29 LAB — COMPREHENSIVE METABOLIC PANEL WITH GFR
ALT: 42 IU/L — ABNORMAL HIGH (ref 0–32)
AST: 25 IU/L (ref 0–40)
Albumin: 4.2 g/dL (ref 3.9–4.9)
Alkaline Phosphatase: 176 IU/L — ABNORMAL HIGH (ref 49–135)
BUN/Creatinine Ratio: 26 (ref 12–28)
BUN: 18 mg/dL (ref 8–27)
Bilirubin Total: 0.4 mg/dL (ref 0.0–1.2)
CO2: 22 mmol/L (ref 20–29)
Calcium: 9.5 mg/dL (ref 8.7–10.3)
Chloride: 102 mmol/L (ref 96–106)
Creatinine, Ser: 0.69 mg/dL (ref 0.57–1.00)
Globulin, Total: 2.8 g/dL (ref 1.5–4.5)
Glucose: 125 mg/dL — ABNORMAL HIGH (ref 70–99)
Potassium: 4.1 mmol/L (ref 3.5–5.2)
Sodium: 140 mmol/L (ref 134–144)
Total Protein: 7 g/dL (ref 6.0–8.5)
eGFR: 99 mL/min/1.73

## 2024-01-29 LAB — HEPATITIS B CORE ANTIBODY, TOTAL: Hep B Core Total Ab: NEGATIVE

## 2024-01-29 LAB — ANTI-SMOOTH MUSCLE ANTIBODY, IGG: Smooth Muscle Ab: 5 U (ref 0–19)

## 2024-01-29 LAB — PROTIME-INR
INR: 0.9 (ref 0.9–1.2)
Prothrombin Time: 10.3 s (ref 9.1–12.0)

## 2024-01-29 LAB — ANA: ANA Titer 1: NEGATIVE

## 2024-01-29 LAB — HEPATITIS B SURFACE ANTIBODY,QUALITATIVE: Hep B Surface Ab, Qual: NONREACTIVE

## 2024-01-29 LAB — MITOCHONDRIAL ANTIBODIES: Mitochondrial Ab: <20 U (ref 0.0–20.0)

## 2024-01-29 LAB — HEPATITIS A ANTIBODY, TOTAL: hep A Total Ab: POSITIVE — AB

## 2024-01-29 LAB — IGA: Immunoglobulin A, (IgA) QN, Serum: 248 mg/dL (ref 87–352)

## 2024-01-29 LAB — HIV ANTIBODY (ROUTINE TESTING W REFLEX): HIV Screen 4th Generation wRfx: NONREACTIVE

## 2024-01-29 LAB — TISSUE TRANSGLUTAMINASE ABS,IGG,IGA
t-Transglutaminase (tTG) IgA: <2 U/mL (ref 0–3)
t-Transglutaminase (tTG) IgG: 3 U/mL (ref 0–5)

## 2024-01-29 LAB — HEPATITIS B SURFACE ANTIGEN: Hepatitis B Surface Ag: NEGATIVE

## 2024-01-29 LAB — HEPATITIS C ANTIBODY: Hep C Virus Ab: NONREACTIVE

## 2024-02-01 ENCOUNTER — Other Ambulatory Visit: Payer: Self-pay | Admitting: *Deleted

## 2024-02-01 LAB — GI PROFILE, STOOL, PCR

## 2024-02-01 MED ORDER — MAGNESIUM OXIDE -MG SUPPLEMENT 400 (240 MG) MG PO TABS: 1.0000 | ORAL_TABLET | Freq: Two times a day (BID) | ORAL | 3 refills | Status: AC

## 2024-02-02 LAB — CALPROTECTIN, FECAL: Calprotectin, Fecal: 159 ug/g — ABNORMAL HIGH (ref 0–120)

## 2024-02-02 LAB — C DIFFICILE TOXINS A+B W/RFLX: C difficile Toxins A+B, EIA: NEGATIVE

## 2024-02-02 LAB — C DIFFICILE, CYTOTOXIN B

## 2024-02-04 ENCOUNTER — Ambulatory Visit (HOSPITAL_COMMUNITY)
Admission: RE | Admit: 2024-02-04 | Discharge: 2024-02-04 | Disposition: A | Discharge status: Home / Self Care | Source: Ambulatory Visit | Attending: Gastroenterology | Admitting: Gastroenterology

## 2024-02-04 DIAGNOSIS — K529 Noninfective gastroenteritis and colitis, unspecified: Secondary | ICD-10-CM | POA: Diagnosis present

## 2024-02-04 DIAGNOSIS — R748 Abnormal levels of other serum enzymes: Secondary | ICD-10-CM | POA: Insufficient documentation

## 2024-02-09 ENCOUNTER — Ambulatory Visit (INDEPENDENT_AMBULATORY_CARE_PROVIDER_SITE_OTHER): Payer: Self-pay | Admitting: Gastroenterology

## 2024-02-09 NOTE — Progress Notes (Signed)
 Fecal calprotectin 159 (elevated)  Ferritin 58 iron saturation 13(low)  Elevated liver enzymes ALP 178 ALT 42 T. bili 0.4  Hep A immune Hep B core negative hep B surface antibody negative hep B surface antigen negative Hep C negative HIV negative  C. difficile negative GI PCR negative  Celiac panel negative  Negative ANA, AMA, ASMA  INR 0.9  CRP 8  Ultrasound:  IMPRESSION: 1.  Hepatic steatosis.   2.  Cholelithiasis without acute abnormality.  -------------------- Vaccinate against hepatitis B Recommend bidirectional endoscopy

## 2024-02-11 NOTE — Progress Notes (Signed)
 Pt's infusion appt moved d/t anticipation of inclement weather on Monday 1/26.   Dallas Scorsone, PharmD, MBA

## 2024-02-12 ENCOUNTER — Inpatient Hospital Stay

## 2024-02-12 ENCOUNTER — Inpatient Hospital Stay: Admitting: Oncology

## 2024-02-12 VITALS — BP 129/68 | HR 66 | Temp 97.5°F | Resp 18

## 2024-02-12 DIAGNOSIS — R197 Diarrhea, unspecified: Secondary | ICD-10-CM | POA: Diagnosis not present

## 2024-02-12 DIAGNOSIS — C541 Malignant neoplasm of endometrium: Secondary | ICD-10-CM

## 2024-02-12 DIAGNOSIS — T451X5A Adverse effect of antineoplastic and immunosuppressive drugs, initial encounter: Secondary | ICD-10-CM

## 2024-02-12 DIAGNOSIS — E039 Hypothyroidism, unspecified: Secondary | ICD-10-CM | POA: Diagnosis not present

## 2024-02-12 DIAGNOSIS — Z95828 Presence of other vascular implants and grafts: Secondary | ICD-10-CM

## 2024-02-12 DIAGNOSIS — Z5112 Encounter for antineoplastic immunotherapy: Secondary | ICD-10-CM | POA: Diagnosis not present

## 2024-02-12 DIAGNOSIS — G620 Drug-induced polyneuropathy: Secondary | ICD-10-CM

## 2024-02-12 LAB — CBC WITH DIFFERENTIAL/PLATELET
Abs Immature Granulocytes: 0.02 K/uL (ref 0.00–0.07)
Basophils Absolute: 0.1 K/uL (ref 0.0–0.1)
Basophils Relative: 1 %
Eosinophils Absolute: 0.4 K/uL (ref 0.0–0.5)
Eosinophils Relative: 5 %
HCT: 43.3 % (ref 36.0–46.0)
Hemoglobin: 14.2 g/dL (ref 12.0–15.0)
Immature Granulocytes: 0 %
Lymphocytes Relative: 37 %
Lymphs Abs: 3.1 K/uL (ref 0.7–4.0)
MCH: 26.9 pg (ref 26.0–34.0)
MCHC: 32.8 g/dL (ref 30.0–36.0)
MCV: 82.2 fL (ref 80.0–100.0)
Monocytes Absolute: 0.6 K/uL (ref 0.1–1.0)
Monocytes Relative: 7 %
Neutro Abs: 4.1 K/uL (ref 1.7–7.7)
Neutrophils Relative %: 50 %
Platelets: 261 K/uL (ref 150–400)
RBC: 5.27 MIL/uL — ABNORMAL HIGH (ref 3.87–5.11)
RDW: 13.7 % (ref 11.5–15.5)
WBC: 8.2 K/uL (ref 4.0–10.5)
nRBC: 0 % (ref 0.0–0.2)

## 2024-02-12 LAB — COMPREHENSIVE METABOLIC PANEL WITH GFR
ALT: 26 U/L (ref 0–44)
AST: 22 U/L (ref 15–41)
Albumin: 4.1 g/dL (ref 3.5–5.0)
Alkaline Phosphatase: 122 U/L (ref 38–126)
Anion gap: 13 (ref 5–15)
BUN: 17 mg/dL (ref 8–23)
CO2: 23 mmol/L (ref 22–32)
Calcium: 8.9 mg/dL (ref 8.9–10.3)
Chloride: 105 mmol/L (ref 98–111)
Creatinine, Ser: 0.58 mg/dL (ref 0.44–1.00)
GFR, Estimated: >60 mL/min
Glucose, Bld: 108 mg/dL — ABNORMAL HIGH (ref 70–99)
Potassium: 3.9 mmol/L (ref 3.5–5.1)
Sodium: 141 mmol/L (ref 135–145)
Total Bilirubin: 0.2 mg/dL (ref 0.0–1.2)
Total Protein: 6.3 g/dL — ABNORMAL LOW (ref 6.5–8.1)

## 2024-02-12 LAB — TSH: TSH: 8.14 u[IU]/mL — ABNORMAL HIGH (ref 0.350–4.500)

## 2024-02-12 LAB — MAGNESIUM: Magnesium: 1.6 mg/dL — ABNORMAL LOW (ref 1.7–2.4)

## 2024-02-12 MED ORDER — SODIUM CHLORIDE 0.9 % IV SOLN
200.0000 mg | Freq: Once | INTRAVENOUS | Status: AC
  Administered 2024-02-12: 200 mg via INTRAVENOUS
  Filled 2024-02-12: qty 8

## 2024-02-12 MED ORDER — LEVOTHYROXINE SODIUM 25 MCG PO TABS: 25.0000 ug | ORAL_TABLET | Freq: Every day | ORAL | 2 refills | Status: AC

## 2024-02-12 MED ORDER — SODIUM CHLORIDE 0.9 % IV SOLN: INTRAVENOUS | Status: DC

## 2024-02-12 MED ORDER — MAGNESIUM SULFATE 2 GM/50ML IV SOLN
2.0000 g | Freq: Once | INTRAVENOUS | Status: AC
  Administered 2024-02-12: 2 g via INTRAVENOUS
  Filled 2024-02-12: qty 50

## 2024-02-12 NOTE — Progress Notes (Signed)
 Patient presents today for Keytruda  infusion per providers order.  Vital signs within parameters for treatment.  Patient has no new complaints at this time.  Magnesium  noted to be 1.6, patient will receive 2 grams IV magnesium  per providers order.   Treatment given today per MD orders.  Stable during infusion without adverse affects.  Vital signs stable.  No complaints at this time.  Discharge from clinic ambulatory in stable condition.  Alert and oriented X 3.  Follow up with [facility] as scheduled.

## 2024-02-12 NOTE — Patient Instructions (Signed)
 [facility] [place] - A DEPT OF [facility]  Discharge Instructions: Thank you for choosing [facility] to provide your oncology and hematology care.  If you have a lab appointment with the Cancer Center - please note that after April 8th, 2024, all labs will be drawn in the cancer center.  You do not have to check in or register with the main entrance as you have in the past but will complete your check-in in the cancer center.  Wear comfortable clothing and clothing appropriate for easy access to any Portacath or PICC line.   We strive to give you quality time with your provider. You may need to reschedule your appointment if you arrive late (15 or more minutes).  Arriving late affects you and other patients whose appointments are after yours.  Also, if you miss three or more appointments without notifying the office, you may be dismissed from the clinic at the providers discretion.      For prescription refill requests, have your pharmacy contact our office and allow 72 hours for refills to be completed.    Today you received the following chemotherapy and/or immunotherapy agents Keytruda /Magnesium       To help prevent nausea and vomiting after your treatment, we encourage you to take your nausea medication as directed.  BELOW ARE SYMPTOMS THAT SHOULD BE REPORTED IMMEDIATELY: *FEVER GREATER THAN 100.4 F (38 C) OR HIGHER *CHILLS OR SWEATING *NAUSEA AND VOMITING THAT IS NOT CONTROLLED WITH YOUR NAUSEA MEDICATION *UNUSUAL SHORTNESS OF BREATH *UNUSUAL BRUISING OR BLEEDING *URINARY PROBLEMS (pain or burning when urinating, or frequent urination) *BOWEL PROBLEMS (unusual diarrhea, constipation, pain near the anus) TENDERNESS IN MOUTH AND THROAT WITH OR WITHOUT PRESENCE OF ULCERS (sore throat, sores in mouth, or a toothache) UNUSUAL RASH, SWELLING OR PAIN  UNUSUAL VAGINAL DISCHARGE OR ITCHING   Items with * indicate a potential emergency and should be  followed up as soon as possible or go to the Emergency Department if any problems should occur.  Please show the CHEMOTHERAPY ALERT CARD or IMMUNOTHERAPY ALERT CARD at check-in to the Emergency Department and triage nurse.  Should you have questions after your visit or need to cancel or reschedule your appointment, please contact [facility] [place] - A DEPT OF [name] [phone]  and follow the prompts.  Office hours are 8:00 a.m. to 4:30 p.m. Monday - Friday. Please note that voicemails left after 4:00 p.m. may not be returned until the following business day.  We are closed weekends and major holidays. You have access to a nurse at all times for urgent questions. Please call the main number to the clinic [phone] and follow the prompts.  For any non-urgent questions, you may also contact your provider using MyChart. We now offer e-Visits for anyone 19 and older to request care online for non-urgent symptoms. For details visit [email or web address].   Also download the MyChart app! Go to the app store, search MyChart, open the app, select [place], and log in with your MyChart username and password.

## 2024-02-12 NOTE — Progress Notes (Signed)
 " Patient Care Team: [name], FNP as PCP - General (Family Medicine) Stacia [name], MD as PCP - Cardiology (Cardiology) Wilson, [name], RN as Oncology Nurse Navigator  Clinic Day:  01/25/2024  Referring physician: [name], FNP   CHIEF COMPLAINT:  CC: Recurrent endometrial carcinosarcoma    ASSESSMENT & PLAN:   Assessment & Plan: Jessica [name]  is a 64 y.o. 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  Tolerating this dose of Lenvima  well. - Labs reviewed today: CBC: WNL, CMP: Normal creatinine, magnesium : 1.6 -Reviewed EKG: QTC: 391. Will repeat every other month. -Physical exam stable today. Will proceed with treatment today.  - Has seen Dr. Viktoria with GYN/ONC and Dr. Dannielle with Rad/Onc.  - Will repeat PET scan in 3 months. I.e 02/2024.   Return to clinic in 6 weeks with a PET scan.  Peripheral neuropathy Peripheral neuropathy likely related to chemotherapy, with symptoms of numbness and tingling in the toes.  - Continue Lyrica  75 mg twice daily for peripheral neuropathy.   Chronic diarrhea Chronic diarrhea prior to starting treatment with lenvima  and pembrolizumab  with recent worsening Scheduled for colonoscopy soon with GI Diarrhea significantly improved since starting low-dose lenvatinib   - Advise Imodium after each loose bowel movement, up to five times daily. - Monitor for infection signs or  complications.  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 T4 not resulted yet.  - Initiated levothyroxine therapy. 25mcg daily.  - Instructed to take levothyroxine daily in the morning on an empty stomach, with no food for at least 30-60 minutes post-dose.  Adverse effects of antineoplastic therapy Symptoms significantly improved with lower dose of lenvatinib .  Elevated liver enzymes Mild elevation likely due to antineoplastic therapy. Normal CBC. Resolved at this time   The patient understands the plans discussed today and is in agreement with them.  She knows to contact our office if she develops concerns prior to her next appointment.  The total time spent in the appointment was 15 minutes for the encounter with patient, including review of chart and various tests results, discussions about plan of care and coordination of care plan  Mickiel Dry, MD  [facility] [place] - A DEPT OF [name] [address] [place] [address] Dept: [phone] Dept Fax: [phone]   No orders of the defined types were placed in this encounter.    ONCOLOGY HISTORY:   I have reviewed her chart and materials related to her cancer extensively and collaborated history with the patient. Summary of oncologic history is as follows:   Diagnosis: Recurrent endometrial carcinosarcoma   -03/29/2018: Cervical biopsy at 5 o'clock, 7 o'clock, and 12 o'clock.  -Pathology: Carcinosarcoma (malignant mixed mullerian tumor). Much of the carcinomatous component has clear cell features. -04/13/2018: TAH, BSO, bilateral pelvic and para-aortic lymph node resections.  Pathology: High-grade carcinosarcoma (malignant mixed mullerian tumor) arising in an endometrial polyp with no myometrial or serosal invasion identified. 0/39 lymph nodes positive. pT1a pN0,  FIGO stage Ia. MMR preserved.  MSI-stable.  -12/09/2018 CT AP: Fairly extensive aggressive and new abdominal/pelvic carcinomatosis consistent with recurrent endometrial cancer. Associated small volume abdominal/pelvic ascites. No findings for bowel involvement or bowel obstruction. -12/22/2018 CA 125 elevated at 127.0 -12/28/2018 Omental mass biopsy. Pathology: Poorly differentiated carcinoma.  -Comment: Immunohistochemistry is positive for PAX 8 and ER (weak). CK7, CK20,  TTF-1, CDX-2 and GATA-3 are negative. -12/29/2018-10/05/2019: Carboplatin  and paclitaxel . Patient developed severe allergic reaction during cycle 14 with carboplatin  and was discontinued -05/02/2019: CT CAP:  Interval response to therapy as evidenced by decrease in size of peritoneal metastases. -07/04/2019: CT AP: Continued positive response to therapy. Scattered peritoneal metastases in the pelvis and left omentum are decreased. No new or progressive metastatic disease in the abdomen or pelvis. -10/24/2019: CT AP: Substantial reduction in size of soft tissue nodules in the pelvis compatible with positive response to therapy. Faint stranding in the left omentum at site of prior nodularity. -11/09/2019-08/22/2021: Single agent paclitaxel  started,01/18/2020 paclitaxel  dose reduced by 20%,  treatment held on 08/22/2021 for chemo holiday -01/10/2020-09/30/2022: CT AP: Stable disease - 04/06/2023:CT AP: New defined 2.2 cm soft tissue density along the superior and right lateral margin of the vaginal cuff, suspicious for local recurrence. Increased size of 11 mm lymph node or peritoneal nodule adjacent to the left external iliac vessels, consistent with metastatic disease. Stable 3 mm nodule or lymph node in sigmoid mesocolon. -04/20/2023: [id number]: MPL [id number]. No other targetable mutations.  -05/18/2023: Left external iliac lymph node biopsy.  -Pathology: Poorly differentiated malignant neoplasm with dual cell population and areas of squamous differentiation.  -Comment: The  epithelioid component shows squamous differentiation and shows labeling for cytokeratin AE1/AE3, p63 and CK5/6.  The more spindled component shows focal labeling for p63 but is negative for cytokeratin AE1/AE3 and CK5/6.  Immunostain for PAX8 shows only weak nonspecific labeling in both components.  -05/18/2023: [id number]: Not enough tissue. HER2 by IHC (1+).  -08/11/2023: CT AP: Increased size of the left external iliac lymph node now measuring 16 mm in short axis previously 11 mm, concerning for progressive nodal disease. Similar soft tissue nodularity along the right vaginal cuff measuring 2.1 cm previously 2.2 cm. -08/18/2023- 10/20/2023: Paclitaxel  140 mg/m every 3 weeks (restarted).  Discontinued for progression. - 09/02/2023: CT CAP: Continued interval enlargement of a left iliac lymph node or soft tissue nodule measuring 3.1 x 3.0 cm, previously 1.8 x 1.6 cm, consistent with worsened metastatic disease. Previously described soft tissue at the right aspect of the vaginal cuff is obscured on today's examination by dense metallic streak artifact from adjacent arthroplasty. New, very tiny nodules in the left lung base measuring up to 0.2 cm, nonspecific and most likely infectious or inflammatory but warranting close attention on follow-up in the setting of established metastatic disease. -11/17/2023: Guardant 360: TMB: high 8.3 mut/Mb, NF1 splice site SNV. TP53.  -12/11/205 - Current: Lenvatinib  14mg  daily and pembrolizumab  200mg  every 3 weeks  -01/25/2024: Dose reduced lenvima  to 10mg  daily (better tolerance) -12/31/2023: [employer] panel: Negative. Heterozygous RAD51D- VUS  Current Treatment:  Lenvatinib  and Pembrolizumab   INTERVAL HISTORY:   Discussed the use of AI scribe software for clinical note transcription with the patient, who gave verbal consent to proceed.  History of Present Illness Chandra Asher is a 64 year old female with malignant neoplasm on pembrolizumab  immunotherapy  and lenvatinib  who presents for routine oncology follow-up and management of treatment-related side effects.  She is currently receiving pembrolizumab  and reports significant improvement in overall  well-being since discontinuation of a prior 14 mg dose of lenvatinib .  She is continuing the 10 mg at this time.  She denies new lower extremity edema or new pain. Fatigue persists and she describes herself as always tired. Paresthesia remains stable, limited to her right toes.  She has developed hypothyroidism attributed to immunotherapy, with a recent elevation in TSH. She is not on thyroid  replacement therapy and has not previously taken levothyroxine.  Chronic diarrhea is under evaluation. She notes significant improvement, now experiencing approximately one episode per day or less, and sometimes not daily. She reports a longstanding history of diarrhea predating cancer treatment. She has previously completed colonoscopy preparation but is uncertain about the need for repeat colonoscopy or endoscopy and expresses reluctance to repeat bowel preparation.  Her previously elevated liver enzymes have normalized. She denies any new or worsening symptoms.   I have reviewed the past medical history, past surgical history, social history and family history with the patient and they are unchanged from previous note.  ALLERGIES:  is allergic to carboplatin  and nickel.  MEDICATIONS:  Current Outpatient Medications  Medication Sig Dispense Refill   amLODipine  (NORVASC ) 5 MG tablet Take 1 tablet by mouth once daily 90 tablet 0   atorvastatin  (LIPITOR) 20 MG tablet Take 1 tablet by mouth once daily 90 tablet 0   Cholecalciferol  (VITAMIN D3) 25 MCG (1000 UT) CAPS Take 1 capsule (1,000 Units total) by mouth daily. 60 capsule 2   lenvatinib  10 mg daily dose (LENVIMA ) capsule Take 1 capsule (10 mg total) by mouth daily. 30 capsule 2   lidocaine -prilocaine  (EMLA ) cream Apply to affected area once as needed as  directed. 30 g 3   MAGnesium -Oxide 400 (240 Mg) MG tablet Take 1 tablet (400 mg total) by mouth 2 (two) times daily. 60 tablet 3   meclizine  (ANTIVERT ) 25 MG tablet Take 1 tablet (25 mg total) by mouth 3 (three) times daily as needed for dizziness. 30 tablet 3   methocarbamol  (ROBAXIN ) 500 MG tablet Take 1 tablet (500 mg total) by mouth every 6 (six) hours as needed for muscle spasms. 40 tablet 1   nitroGLYCERIN  (NITROSTAT ) 0.4 MG SL tablet Place 1 tablet (0.4 mg total) under the tongue every 5 (five) minutes as needed for chest pain. 25 tablet 3   oxyCODONE  (OXY IR/ROXICODONE ) 5 MG immediate release tablet Take 1 tablet (5 mg total) by mouth 2 (two) times daily as needed for severe pain (pain score 7-10). 60 tablet 0   polyethylene glycol (MIRALAX  / GLYCOLAX ) 17 g packet Take 17 g by mouth 2 (two) times daily. 180 packet 0   pregabalin  (LYRICA ) 75 MG capsule Take 1 capsule (75 mg total) by mouth 3 (three) times daily. 90 capsule 3   prochlorperazine  (COMPAZINE ) 10 MG tablet Take 1 tablet (10 mg total) by mouth every 6 (six) hours as needed for nausea or vomiting. 30 tablet 1   psyllium (METAMUCIL) 58.6 % packet Take 1 packet by mouth 2 (two) times daily. 60 packet 2   sertraline  (ZOLOFT ) 100 MG tablet Take 1 tablet by mouth once daily 30 tablet 0   simethicone  (MYLICON) 80 MG chewable tablet Chew 1 tablet (80 mg total) by mouth every 6 (six) hours as needed for flatulence. 30 tablet 0   No current facility-administered medications for this visit.    VITALS:  There were no vitals taken for this visit.  Wt Readings from Last 3 Encounters:  01/27/24 222 lb 1.6 oz (100.7 kg)  01/25/24 226 lb 10.1 oz (102.8 kg)  01/05/24 230 lb (104.3 kg)    There is no height or weight on file to calculate BMI.  Performance status (ECOG): 2 - Symptomatic, <50% confined to bed  PHYSICAL EXAM:   GENERAL:alert, no distress and comfortable SKIN: skin color, texture, turgor are normal, no rashes or significant  lesions LYMPH:  no palpable lymphadenopathy in the cervical, axillary or inguinal LUNGS: clear to auscultation and percussion with normal breathing effort HEART: regular rate & rhythm and no murmurs and no lower extremity edema ABDOMEN:abdomen soft, non-tender and normal bowel sounds Musculoskeletal:no cyanosis of digits and no clubbing  NEURO: alert & oriented x 3 with fluent speech  LABORATORY DATA:  I have reviewed the data as listed    Component Value Date/Time   NA 140 01/27/2024 1126   K 4.1 01/27/2024 1126   CL 102 01/27/2024 1126   CO2 22 01/27/2024 1126   GLUCOSE 125 (H) 01/27/2024 1126   GLUCOSE 132 (H) 01/25/2024 0800   BUN 18 01/27/2024 1126   CREATININE 0.69 01/27/2024 1126   CALCIUM  9.5 01/27/2024 1126   PROT 7.0 01/27/2024 1126   ALBUMIN 4.2 01/27/2024 1126   AST 25 01/27/2024 1126   ALT 42 (H) 01/27/2024 1126   ALKPHOS 176 (H) 01/27/2024 1126   BILITOT 0.4 01/27/2024 1126   GFRNONAA >60 01/25/2024 0800   GFRAA >60 10/05/2019 0825    Lab Results  Component Value Date   WBC 6.1 01/25/2024   NEUTROABS 3.2 01/25/2024   HGB 13.7 01/25/2024   HCT 41.9 01/25/2024   MCV 84.0 01/25/2024   PLT 210 01/25/2024     RADIOGRAPHIC STUDIES: I have personally reviewed the radiological images as listed and agreed with the findings in the report.  "

## 2024-02-13 LAB — T4: T4, Total: 4.3 ug/dL — ABNORMAL LOW (ref 4.5–12.0)

## 2024-02-15 ENCOUNTER — Inpatient Hospital Stay

## 2024-02-15 ENCOUNTER — Inpatient Hospital Stay: Admitting: Oncology

## 2024-02-17 ENCOUNTER — Other Ambulatory Visit: Payer: Self-pay

## 2024-02-19 ENCOUNTER — Other Ambulatory Visit: Payer: Self-pay

## 2024-02-19 NOTE — Progress Notes (Unsigned)
 "  Referring Physician:  [name], FNP [address] [place],  [place] [address]  Primary Physician:  [name], FNP  History of Present Illness: 02/19/2024 [name] is here today with a chief complaint of ***   History of cervical spinal surgery Weakness in her arms and shoulders. Significant neck and shoulder pain  PET scan revealed the right C7 screw is fractured from previous neck surgery.  Duration: *** Location: *** Quality: *** Severity: ***  Precipitating: aggravated by *** Modifying factors: made better by *** Weakness: none Timing: *** Bowel/Bladder Dysfunction: none  Conservative measures:  Physical therapy: *** Has not participated in. Multimodal medical therapy including regular antiinflammatories: *** pregabalin , oxycodone , methocarbamol  Injections: *** epidural steroid injections?  Past Surgery: ***History of neck surgery in 2020 in [place]   Jessica [name] has ***no symptoms of cervical myelopathy.  The symptoms are causing a significant impact on the patient's life.   Review of Systems:  A 10 point review of systems is negative, except for the pertinent positives and negatives detailed in the HPI.  Past Medical History: Past Medical History:  Diagnosis Date   Allergy    Anemia    Anxiety    Arthritis    Cancer (HCC)    Phreesia 09/05/2019   Cancer of skin of back    Chest pain    Depression    Elevated blood pressure reading    Endometrial cancer (HCC)    Hyperlipidemia    Hypertension    PMB (postmenopausal bleeding)    Port-A-Cath in place 12/24/2018   Pre-diabetes     Past Surgical History: Past Surgical History:  Procedure Laterality Date   IR IMAGING GUIDED PORT INSERTION  12/28/2018   IR RADIOLOGIST EVAL & MGMT  06/02/2018   IR RADIOLOGIST EVAL & MGMT  06/16/2018   NECK SURGERY  2000   spinal surgery , titanium rod in place    ROBOTIC ASSISTED TOTAL HYSTERECTOMY WITH BILATERAL SALPINGO  OOPHERECTOMY N/A 04/13/2018   Procedure: XI ROBOTIC ASSISTED TOTAL HYSTERECTOMY WITH BILATERAL SALPINGO OOPHORECTOMY;  Surgeon: Eloy Herring, MD;  Location: [facility];  Service: Gynecology;  Laterality: N/A;   ROBOTIC PELVIC AND PARA-AORTIC LYMPH NODE DISSECTION N/A 04/13/2018   Procedure: XI ROBOTIC PELVIC LYMPHADECTOMY AND PARA-AORTIC LYMPH NODE DISSECTION;  Surgeon: Eloy Herring, MD;  Location: [facility];  Service: Gynecology;  Laterality: N/A;   TOTAL HIP ARTHROPLASTY Right 09/19/2022   Procedure: RIGHT TOTAL HIP ARTHROPLASTY ANTERIOR APPROACH;  Surgeon: Vernetta Lonni GRADE, MD;  Location: [facility];  Service: Orthopedics;  Laterality: Right;    Allergies: Allergies as of 02/22/2024 - Review Complete 02/12/2024  Allergen Reaction Noted   Carboplatin  Other (See Comments) and Anaphylaxis 10/10/2019   Nickel Rash 09/07/2019    Medications: Outpatient Encounter Medications as of 02/22/2024  Medication Sig   amLODipine  (NORVASC ) 5 MG tablet Take 1 tablet by mouth once daily   atorvastatin  (LIPITOR) 20 MG tablet Take 1 tablet by mouth once daily   Cholecalciferol  (VITAMIN D3) 25 MCG (1000 UT) CAPS Take 1 capsule (1,000 Units total) by mouth daily.   GAVILYTE-N  WITH FLAVOR PACK 420 g solution Take 4,000 mLs by mouth once.   lenvatinib  10 mg daily dose (LENVIMA ) capsule Take 1 capsule (10 mg total) by mouth daily.   levothyroxine  (SYNTHROID ) 25 MCG tablet Take 1 tablet (25 mcg total) by mouth daily before breakfast.   lidocaine -prilocaine  (EMLA ) cream Apply to affected area once as needed as directed.  MAGnesium -Oxide 400 (240 Mg) MG tablet Take 1 tablet (400 mg total) by mouth 2 (two) times daily.   meclizine  (ANTIVERT ) 25 MG tablet Take 1 tablet (25 mg total) by mouth 3 (three) times daily as needed for dizziness.   methocarbamol  (ROBAXIN ) 500 MG tablet Take 1 tablet (500 mg total) by mouth every 6 (six) hours as needed for muscle spasms.   nitroGLYCERIN  (NITROSTAT ) 0.4 MG SL tablet Place 1 tablet  (0.4 mg total) under the tongue every 5 (five) minutes as needed for chest pain.   oxyCODONE  (OXY IR/ROXICODONE ) 5 MG immediate release tablet Take 1 tablet (5 mg total) by mouth 2 (two) times daily as needed for severe pain (pain score 7-10).   polyethylene glycol (MIRALAX  / GLYCOLAX ) 17 g packet Take 17 g by mouth 2 (two) times daily.   pregabalin  (LYRICA ) 75 MG capsule Take 1 capsule (75 mg total) by mouth 3 (three) times daily.   prochlorperazine  (COMPAZINE ) 10 MG tablet Take 1 tablet (10 mg total) by mouth every 6 (six) hours as needed for nausea or vomiting.   psyllium (METAMUCIL) 58.6 % packet Take 1 packet by mouth 2 (two) times daily.   sertraline  (ZOLOFT ) 100 MG tablet Take 1 tablet by mouth once daily   simethicone  (MYLICON) 80 MG chewable tablet Chew 1 tablet (80 mg total) by mouth every 6 (six) hours as needed for flatulence.   No facility-administered encounter medications on file as of 02/22/2024.    Social History: Social History[1]  Family Medical History: Family History  Problem Relation Age of Onset   Hypertension Mother    Breast cancer Mother 19   Hypertension Father    Heart disease Father    Lung cancer Father    Ovarian cancer Sister 68   Melanoma Sister 82   Cancer Maternal Aunt        unk type   Cancer Maternal Uncle        unk type   Skin cancer Paternal Grandmother    Breast cancer Maternal Cousin        dx 90s   Colon cancer Neg Hx    Endometrial cancer Neg Hx    Pancreatic cancer Neg Hx    Prostate cancer Neg Hx     Physical Examination: @VITALWITHPAIN @  General: Patient is well developed, well nourished, calm, collected, and in no apparent distress. Attention to examination is appropriate.  Psychiatric: Patient is non-anxious.  Head:  Pupils equal, round, and reactive to light.  ENT:  Oral mucosa appears well hydrated.  Neck:   Supple.  ***Full range of motion.  Respiratory: Patient is breathing without any difficulty.  Extremities: No  edema.  Vascular: Palpable dorsal pedal pulses.  Skin:   On exposed skin, there are no abnormal skin lesions.  NEUROLOGICAL:     Awake, alert, oriented to person, place, and time.  Speech is clear and fluent. Fund of knowledge is appropriate.   Cranial Nerves: Pupils equal round and reactive to light.  Facial tone is symmetric.  Facial sensation is symmetric.  ROM of spine: ***full.  Palpation of spine: ***non tender.    Strength: Side Biceps Triceps Deltoid Interossei Grip Wrist Ext. Wrist Flex.  R 5 5 5 5 5 5 5   L 5 5 5 5 5 5 5    Side Iliopsoas Quads Hamstring PF DF EHL  R 5 5 5 5 5 5   L 5 5 5 5 5 5    Reflexes are ***2+ and symmetric at the  biceps, triceps, brachioradialis, patella and achilles.   Hoffman's is absent.  Clonus is not present.  Toes are down-going.  Bilateral upper and lower extremity sensation is intact to light touch.    Gait is normal.   No difficulty with tandem gait.   No evidence of dysmetria noted.  Medical Decision Making  Imaging: ***  I have personally reviewed the images and agree with the above interpretation.  Assessment and Plan: [name] is a pleasant 64 y.o. female with ***    Thank you for involving me in the care of this patient.   I spent a total of *** minutes in both face-to-face and non-face-to-face activities for this visit on the date of this encounter.   Lyle Decamp, PA-C Dept. of Neurosurgery      [1]  Social History Tobacco Use   Smoking status: Never   Smokeless tobacco: Never  Vaping Use   Vaping status: Never Used  Substance Use Topics   Alcohol use: Yes    Alcohol/week: 4.0 standard drinks of alcohol    Types: 4 Glasses of wine per week    Comment: weekends   Drug use: Never   "

## 2024-02-22 ENCOUNTER — Ambulatory Visit: Admitting: Physician Assistant

## 2024-02-23 ENCOUNTER — Other Ambulatory Visit: Payer: Self-pay

## 2024-02-23 ENCOUNTER — Encounter: Payer: Self-pay | Admitting: Oncology

## 2024-02-23 ENCOUNTER — Other Ambulatory Visit: Payer: Self-pay | Admitting: Pharmacy Technician

## 2024-02-23 NOTE — Progress Notes (Signed)
 Specialty Pharmacy Refill Coordination Note  Jessica [name] is a 64 y.o. female contacted today regarding refills of specialty medication(s) Lenvatinib  Mesylate (LENVIMA )   Patient requested Delivery   Delivery date: 02/25/24   Verified address: [address] [place] [address]   Medication will be filled on: 02/24/24    02/23/2024 Patient is aware of $12.65 copay and has authorized charge to card on file.

## 2024-02-24 ENCOUNTER — Other Ambulatory Visit: Payer: Self-pay

## 2024-02-25 ENCOUNTER — Ambulatory Visit: Admitting: Physician Assistant

## 2024-02-25 ENCOUNTER — Other Ambulatory Visit (HOSPITAL_COMMUNITY): Payer: Self-pay

## 2024-02-25 ENCOUNTER — Other Ambulatory Visit: Payer: Self-pay

## 2024-02-26 ENCOUNTER — Other Ambulatory Visit: Payer: Self-pay

## 2024-02-26 DIAGNOSIS — F419 Anxiety disorder, unspecified: Secondary | ICD-10-CM

## 2024-02-26 DIAGNOSIS — F321 Major depressive disorder, single episode, moderate: Secondary | ICD-10-CM

## 2024-03-03 ENCOUNTER — Ambulatory Visit: Admitting: Physician Assistant

## 2024-03-07 ENCOUNTER — Inpatient Hospital Stay

## 2024-03-07 ENCOUNTER — Inpatient Hospital Stay: Attending: Hematology

## 2024-03-17 ENCOUNTER — Ambulatory Visit (HOSPITAL_COMMUNITY)

## 2024-03-28 ENCOUNTER — Inpatient Hospital Stay

## 2024-03-28 ENCOUNTER — Inpatient Hospital Stay: Admitting: Oncology

## 2024-03-28 ENCOUNTER — Inpatient Hospital Stay: Attending: Hematology

## 2024-04-18 ENCOUNTER — Ambulatory Visit

## 2024-05-10 ENCOUNTER — Ambulatory Visit: Payer: Self-pay
